# Patient Record
Sex: Male | Born: 1945 | Race: White | Hispanic: No | Marital: Married | Smoking: Never smoker
Health system: Southern US, Community
[De-identification: ages and names within clinical notes are randomized; demographics above are authoritative.]

## PROBLEM LIST (undated history)

## (undated) DIAGNOSIS — K219 Gastro-esophageal reflux disease without esophagitis: Secondary | ICD-10-CM

## (undated) DIAGNOSIS — N201 Calculus of ureter: Secondary | ICD-10-CM

## (undated) DIAGNOSIS — I219 Acute myocardial infarction, unspecified: Secondary | ICD-10-CM

## (undated) DIAGNOSIS — I252 Old myocardial infarction: Secondary | ICD-10-CM

## (undated) DIAGNOSIS — R0600 Dyspnea, unspecified: Secondary | ICD-10-CM

## (undated) DIAGNOSIS — Z87442 Personal history of urinary calculi: Secondary | ICD-10-CM

## (undated) DIAGNOSIS — E781 Pure hyperglyceridemia: Secondary | ICD-10-CM

## (undated) DIAGNOSIS — I10 Essential (primary) hypertension: Secondary | ICD-10-CM

## (undated) DIAGNOSIS — G4730 Sleep apnea, unspecified: Secondary | ICD-10-CM

## (undated) DIAGNOSIS — F32A Depression, unspecified: Secondary | ICD-10-CM

## (undated) DIAGNOSIS — Z8719 Personal history of other diseases of the digestive system: Secondary | ICD-10-CM

## (undated) DIAGNOSIS — M1990 Unspecified osteoarthritis, unspecified site: Secondary | ICD-10-CM

## (undated) DIAGNOSIS — E785 Hyperlipidemia, unspecified: Secondary | ICD-10-CM

## (undated) DIAGNOSIS — N183 Chronic kidney disease, stage 3 unspecified: Secondary | ICD-10-CM

## (undated) DIAGNOSIS — Z923 Personal history of irradiation: Secondary | ICD-10-CM

## (undated) DIAGNOSIS — Z9189 Other specified personal risk factors, not elsewhere classified: Secondary | ICD-10-CM

## (undated) DIAGNOSIS — D509 Iron deficiency anemia, unspecified: Secondary | ICD-10-CM

## (undated) DIAGNOSIS — I499 Cardiac arrhythmia, unspecified: Secondary | ICD-10-CM

## (undated) DIAGNOSIS — I4819 Other persistent atrial fibrillation: Secondary | ICD-10-CM

## (undated) DIAGNOSIS — I2510 Atherosclerotic heart disease of native coronary artery without angina pectoris: Secondary | ICD-10-CM

## (undated) DIAGNOSIS — I5042 Chronic combined systolic (congestive) and diastolic (congestive) heart failure: Secondary | ICD-10-CM

## (undated) DIAGNOSIS — Z955 Presence of coronary angioplasty implant and graft: Secondary | ICD-10-CM

## (undated) DIAGNOSIS — R0609 Other forms of dyspnea: Secondary | ICD-10-CM

## (undated) HISTORY — DX: Hyperlipidemia, unspecified: E78.5

## (undated) HISTORY — DX: Atherosclerotic heart disease of native coronary artery without angina pectoris: I25.10

## (undated) HISTORY — PX: CORONARY ANGIOPLASTY WITH STENT PLACEMENT: SHX49

## (undated) HISTORY — PX: CORONARY ANGIOPLASTY: SHX604

## (undated) HISTORY — DX: Gastro-esophageal reflux disease without esophagitis: K21.9

## (undated) HISTORY — DX: Essential (primary) hypertension: I10

## (undated) HISTORY — PX: CARDIOVASCULAR STRESS TEST: SHX262

## (undated) HISTORY — PX: EXTRACORPOREAL SHOCK WAVE LITHOTRIPSY: SHX1557

## (undated) SURGERY — BRONCHOSCOPY, WITH BIOPSY USING ELECTROMAGNETIC NAVIGATION
Anesthesia: General | Laterality: Right

---

## 1998-11-11 HISTORY — PX: HERNIA REPAIR: SHX51

## 1999-08-17 ENCOUNTER — Encounter: Payer: Self-pay | Admitting: General Surgery

## 1999-08-22 ENCOUNTER — Observation Stay (HOSPITAL_COMMUNITY): Admission: RE | Admit: 1999-08-22 | Discharge: 1999-08-23 | Payer: Self-pay | Admitting: General Surgery

## 2005-11-04 ENCOUNTER — Encounter: Payer: Self-pay | Admitting: Emergency Medicine

## 2005-11-04 ENCOUNTER — Inpatient Hospital Stay (HOSPITAL_COMMUNITY): Admission: AD | Admit: 2005-11-04 | Discharge: 2005-11-09 | Payer: Self-pay | Admitting: Cardiology

## 2005-11-04 ENCOUNTER — Ambulatory Visit: Payer: Self-pay | Admitting: Cardiology

## 2005-11-06 ENCOUNTER — Encounter: Payer: Self-pay | Admitting: Cardiology

## 2005-11-06 ENCOUNTER — Ambulatory Visit: Payer: Self-pay | Admitting: Cardiology

## 2005-11-13 ENCOUNTER — Ambulatory Visit (HOSPITAL_COMMUNITY): Admission: RE | Admit: 2005-11-13 | Discharge: 2005-11-14 | Payer: Self-pay | Admitting: Cardiology

## 2005-11-13 ENCOUNTER — Ambulatory Visit: Payer: Self-pay | Admitting: Cardiology

## 2005-12-02 ENCOUNTER — Ambulatory Visit: Payer: Self-pay | Admitting: Cardiology

## 2005-12-05 ENCOUNTER — Encounter (HOSPITAL_COMMUNITY): Admission: RE | Admit: 2005-12-05 | Discharge: 2006-03-05 | Payer: Self-pay | Admitting: Cardiology

## 2006-02-24 ENCOUNTER — Ambulatory Visit: Payer: Self-pay | Admitting: Cardiology

## 2006-03-06 ENCOUNTER — Encounter (HOSPITAL_COMMUNITY): Admission: RE | Admit: 2006-03-06 | Discharge: 2006-06-04 | Payer: Self-pay | Admitting: Cardiology

## 2006-03-21 ENCOUNTER — Encounter: Payer: Self-pay | Admitting: Vascular Surgery

## 2006-03-21 ENCOUNTER — Ambulatory Visit (HOSPITAL_COMMUNITY): Admission: RE | Admit: 2006-03-21 | Discharge: 2006-03-21 | Payer: Self-pay | Admitting: Emergency Medicine

## 2006-04-10 ENCOUNTER — Ambulatory Visit (HOSPITAL_COMMUNITY): Admission: RE | Admit: 2006-04-10 | Discharge: 2006-04-10 | Payer: Self-pay | Admitting: Urology

## 2006-04-10 HISTORY — PX: OTHER SURGICAL HISTORY: SHX169

## 2006-08-11 ENCOUNTER — Ambulatory Visit: Payer: Self-pay | Admitting: Cardiology

## 2007-09-16 ENCOUNTER — Ambulatory Visit: Payer: Self-pay | Admitting: Cardiology

## 2008-08-26 ENCOUNTER — Ambulatory Visit: Payer: Self-pay | Admitting: Cardiology

## 2009-07-25 ENCOUNTER — Encounter (INDEPENDENT_AMBULATORY_CARE_PROVIDER_SITE_OTHER): Payer: Self-pay | Admitting: *Deleted

## 2009-09-11 ENCOUNTER — Encounter: Payer: Self-pay | Admitting: Cardiology

## 2009-09-12 ENCOUNTER — Ambulatory Visit: Payer: Self-pay | Admitting: Cardiology

## 2010-08-27 ENCOUNTER — Encounter: Payer: Self-pay | Admitting: Cardiology

## 2010-09-26 ENCOUNTER — Ambulatory Visit: Payer: Self-pay | Admitting: Cardiology

## 2010-11-06 ENCOUNTER — Telehealth: Payer: Self-pay | Admitting: Cardiology

## 2010-11-29 ENCOUNTER — Encounter: Payer: Self-pay | Admitting: Cardiology

## 2010-12-11 NOTE — Assessment & Plan Note (Signed)
Summary: YEARLY/SL   Visit Type:  1 year follow up Primary Provider:  Darrow Bussing, MD  CC:  CAD.  History of Present Illness:   The patient is seen for followup of coronary artery disease.Peter [name]  He has not been having any chest pain.  His intervention was done in 2006.  He has not needed any type of evaluation since then.  It is now 5 years.  He doesn't have full activity.  His blood pressures being very nicely treated.  Current Medications (verified): 1)  Lipitor 80 Mg Tabs (Atorvastatin Calcium) .... Take One-Half Tablet By Mouth Daily. 2)  Amlodipine Besylate 10 Mg Tabs (Amlodipine Besylate) .... Take One Tablet By Mouth Daily 3)  Metoprolol Succinate 100 Mg Xr24h-Tab (Metoprolol Succinate) .... Take One Tablet By Mouth Daily 4)  Plavix 75 Mg Tabs (Clopidogrel Bisulfate) .... Take One Tablet By Mouth Daily 5)  Lisinopril 40 Mg Tabs (Lisinopril) .... Take One Tablet By Mouth Daily 6)  Tricor 145 Mg Tabs (Fenofibrate) .... Take One Tablet By Mouth Once Daily. 7)  Aspirin Ec 325 Mg Tbec (Aspirin) .... Take One Tablet By Mouth Daily 8)  Hydrochlorothiazide 25 Mg Tabs (Hydrochlorothiazide) .... Take One Tablet By Mouth Every Day.  Allergies (verified): No Known Drug Allergies  Past History:  Past Medical History: Last updated: 09/12/2009 CAD, NATIVE VESSEL (ICD-414.01)...PCI... December, 2006.... complex bifurcation lesion EF 55-65%.... echo.... December 2 006   Plavix... use indefinitely.... complex bifurcation lesion HYPERTENSION, UNSPECIFIED (ICD-401.9) HYPERCHOLESTEROLEMIA  IIA (ICD-272.0) OVERWEIGHT/OBESITY (ICD-278.02) GERD  Review of Systems       Patient denies fever, chills, headache, sweats, rash, change in vision, change in hearing, chest pain, cough, nausea vomiting, urinary symptoms.  All of the systems are reviewed and are negative  Vital Signs:  Patient profile:   65 year old male Height:      68 inches Weight:      221.50 pounds BMI:     33.80 Pulse rate:    67 / minute BP sitting:   132 / 72  (left arm) Cuff size:   regular  Vitals Entered By: Caralee Ates CMA (September 26, 2010 9:39 AM)  Physical Exam  General:  Patient is quite stable.  He is overweight. Head:  head is atraumatic. Eyes:  no xanthelasma. Abdomen:  abdomen is soft. Msk:  no musculoskeletal deformities. Extremities:  no peripheral edema. Skin:  no skin rashes. Psych:  patient is oriented to person, place affect is normal.   Impression & Recommendations:  Problem # 1:  CAD, NATIVE VESSEL (ICD-414.01)  His updated medication list for this problem includes:    Amlodipine Besylate 10 Mg Tabs (Amlodipine besylate) .Peter [name]... Take one tablet by mouth daily    Metoprolol Succinate 100 Mg Xr24h-tab (Metoprolol succinate) .Peter [name]... Take one tablet by mouth daily    Plavix 75 Mg Tabs (Clopidogrel bisulfate) .Peter [name]... Take one tablet by mouth daily    Lisinopril 40 Mg Tabs (Lisinopril) .Peter [name]... Take one tablet by mouth daily    Aspirin Ec 325 Mg Tbec (Aspirin) .Peter [name]... Take one tablet by mouth daily Coronary disease has been stable.  He is not having any significant symptoms.  However it has been 5 years since he has had any type of exercise testing.  We will proceed with a stress Myoview scan when it can be arranged.  EKG is done today and reviewed by me.  There is no significant change.  Orders: EKG w/ Interpretation (93000) Nuclear Stress Test (Nuc Stress Test)  Problem #  2:  HYPERTENSION, UNSPECIFIED (ICD-401.9)  His updated medication list for this problem includes:    Amlodipine Besylate 10 Mg Tabs (Amlodipine besylate) .Peter [name]... Take one tablet by mouth daily    Metoprolol Succinate 100 Mg Xr24h-tab (Metoprolol succinate) .Peter [name]... Take one tablet by mouth daily    Lisinopril 40 Mg Tabs (Lisinopril) .Peter [name]... Take one tablet by mouth daily    Aspirin Ec 325 Mg Tbec (Aspirin) .Peter [name]... Take one tablet by mouth daily    Hydrochlorothiazide 25 Mg Tabs (Hydrochlorothiazide) .Peter [name]... Take one tablet by mouth  every day. The patient's blood pressure is nicely treated.  No change in therapy.  Problem # 3:  HYPERCHOLESTEROLEMIA  IIA (ICD-272.0)  His updated medication list for this problem includes:    Lipitor 80 Mg Tabs (Atorvastatin calcium) .Peter [name]... Take one-half tablet by mouth daily.    Tricor 145 Mg Tabs (Fenofibrate) .Peter [name]... Take one tablet by mouth once daily. Lipids are being treated nicely.  No change in therapy.  Problem # 4:  OVERWEIGHT/OBESITY (ICD-278.02) Weight loss continues to be important for him.  Cardiology followup in one year.  We will look into arranging for an exercise test.  Patient Instructions: 1)  Your physician has requested that you have an exercise stress myoview.  For further information please visit [email or web address]  Please follow instruction sheet, as given.  PLEASE CALL us IN JAN. TO SCHEDULE TEST. 2)  Your physician wants you to follow-up in:  1 year.  You will receive a reminder letter in the mail two months in advance. If you don't receive a letter, please call our office to schedule the follow-up appointment. Prescriptions: HYDROCHLOROTHIAZIDE 25 MG TABS (HYDROCHLOROTHIAZIDE) Take one tablet by mouth every day.  #90 x 3   Entered by:   Meredith Staggers, RN   Authorized by:   Talitha Givens, MD, [facility]   Signed by:   Meredith Staggers, RN on 09/26/2010   Method used:   Electronically to        [facility] [phone]* (retail)       [address]       [place], [place]  [address]       Ph: [phone]       Fax: [phone]   RxID:   [id number] TRICOR 145 MG TABS (FENOFIBRATE) Take one tablet by mouth once daily.  #90 x 3   Entered by:   Meredith Staggers, RN   Authorized by:   Talitha Givens, MD, [facility]   Signed by:   Meredith Staggers, RN on 09/26/2010   Method used:   Electronically to        [facility] [phone]* (retail)       [address]       [place], [place]  [address]       Ph:  [phone]       Fax: [phone]   RxID:   [id number] LISINOPRIL 40 MG TABS (LISINOPRIL) Take one tablet by mouth daily  #90 x 3   Entered by:   Meredith Staggers, RN   Authorized by:   Talitha Givens, MD, [facility]   Signed by:   Meredith Staggers, RN on 09/26/2010   Method used:   Electronically to        [facility] #[id number]* (retail)       [address]  [place], [place]  [address]       Ph: [phone]       Fax: [phone]   RxID:   [id number] PLAVIX 75 MG TABS (CLOPIDOGREL BISULFATE) Take one tablet by mouth daily  #90 x 3   Entered by:   Meredith Staggers, RN   Authorized by:   Talitha Givens, MD, [facility]   Signed by:   Meredith Staggers, RN on 09/26/2010   Method used:   Electronically to        [facility] [phone]* (retail)       [address]       [place], [place]  [address]       Ph: [phone]       Fax: [phone]   RxID:   [id number] METOPROLOL SUCCINATE 100 MG XR24H-TAB (METOPROLOL SUCCINATE) Take one tablet by mouth daily  #90 x 3   Entered by:   Meredith Staggers, RN   Authorized by:   Talitha Givens, MD, [facility]   Signed by:   Meredith Staggers, RN on 09/26/2010   Method used:   Electronically to        [facility] [phone]* (retail)       [address]       [place], [place]  [address]       Ph: [phone]       Fax: [phone]   RxID:   [id number] AMLODIPINE BESYLATE 10 MG TABS (AMLODIPINE BESYLATE) Take one tablet by mouth daily  #90 x 3   Entered by:   Meredith Staggers, RN   Authorized by:   Talitha Givens, MD, [facility]   Signed by:   Meredith Staggers, RN on 09/26/2010   Method used:   Electronically to        [facility] [phone]* (retail)       [address]       [place], [place]  [address]       Ph: [phone]       Fax: [phone]   RxID:   [id number] LIPITOR 80 MG TABS (ATORVASTATIN CALCIUM) Take one-half  tablet by mouth daily.  #90 x 3   Entered by:   Meredith Staggers, RN   Authorized by:   Talitha Givens, MD, [facility]   Signed by:   Meredith Staggers, RN on 09/26/2010   Method used:   Electronically to        [facility] [phone]* (retail)       [address]       [place], [place]  [address]       Ph: [phone]       Fax: [phone]   RxID:   [id number]

## 2010-12-13 NOTE — Progress Notes (Signed)
Summary: question on meds- LVMTCB  Phone Note Call from Patient Call back at Home Phone [phone]   Caller: Patient Reason for Call: Talk to Nurse Summary of Call: pt is having laser surgery on his gum. pt has question re PLAVIX. Initial call taken by: Roe Coombs,  November 06, 2010 9:16 AM  Follow-up for Phone Call        LVMTCB Whitney Maeola Sarah RN  November 06, 2010 9:31 AM  Pt returning call Judie Grieve  November 06, 2010 10:11 AM  Patient having laser gum surgery and is wondering if he should be off of Plavix prior to this. He is having it done Friday. Dr. Denice Bors , his opthamologist, didn't say anything to him regarding the Plavix but did say there is a risk for bleeding. I explained to him that we would need to run this by Dr. Myrtis Ser who is here tomorrow. I will forward this to Dr. Myrtis Ser and his RN. Whitney Maeola Sarah RN  November 06, 2010 10:33 AM  Follow-up by: Whitney Maeola Sarah RN,  November 06, 2010 9:31 AM  Additional Follow-up for Phone Call Additional follow up Details #1::        pt called in: he spoke w/his surgeon and the surgeon stated that no one has had to come off of plavix in the past for this surgery.  He has continued to stay on his plavix.  adv would let md know as fyi.  Additional Follow-up by: Claris Gladden RN,  November 08, 2010 5:04 PM    Additional Follow-up for Phone Call Additional follow up Details #2::    OK    Talitha Givens, MD, [facility]  November 10, 2010 8:19 AM

## 2010-12-13 NOTE — Miscellaneous (Addendum)
  Clinical Lists Changes  Medications: Added new medication of NITROSTAT 0.4 MG SUBL (NITROGLYCERIN) take as directed - Signed Rx of NITROSTAT 0.4 MG SUBL (NITROGLYCERIN) take as directed;  #25 x 1;  Signed;  Entered by: Layne Benton, RN, BSN;  Authorized by: Talitha Givens, MD, [facility];  Method used: Electronically to [facility] #[id number]*, [address] [place], [place], [place]  [address], Ph: [phone], Fax: [phone]    Prescriptions: NITROSTAT 0.4 MG SUBL (NITROGLYCERIN) take as directed  #25 x 1   Entered by:   Layne Benton, RN, BSN   Authorized by:   Talitha Givens, MD, [facility]   Signed by:   Layne Benton, RN, BSN on 11/29/2010   Method used:   Electronically to        [facility] [phone]* (retail)       [address]       [place], [place]  [address]       Ph: [phone]       Fax: [phone]   RxID:   [id number]

## 2011-03-26 NOTE — Assessment & Plan Note (Signed)
[name]                            CARDIOLOGY OFFICE NOTE   Peter, [name] Peter [name]                     MRN:          [id number]  DATE:08/26/2008                            DOB:          [birth year]    Peter [name] is doing well.  He is here for cardiology followup.  He had  been aerating his yard with an aerating machine.  The next day he had  slight discomfort under his left breast.  I believe this was  musculoskeletal.  Other than this, he has done well.  He has not been  having chest pain or shortness of breath.  Unfortunately, he has had  some difficulty with his left knee.  He had this injected and hopefully  he will be improved, so that he can get back to golfing.   His intervention was done in 2006.  It was a complex bifurcation lesion.  On this basis, I have chosen to keep him on aspirin and Plavix, and he  is doing well.   ALLERGIES:  No known drug allergies.   MEDICATIONS:  Aspirin, Plavix, lisinopril, Toprol-XL, TriCor, Norvasc,  hydrochlorothiazide, iron, and Lipitor.   OTHER MEDICAL PROBLEMS:  See the list below.   REVIEW OF SYSTEMS:  He has no fevers or chills.  He has no GI or GU  symptoms.  Other than his knee, he is not having any other significant  musculoskeletal problems.  His review of systems is otherwise negative.   PHYSICAL EXAMINATION:  VITAL SIGNS:  Blood pressure is 135/85 with a  pulse of 63.  GENERAL:  The patient is oriented to person, time, and place.  Affect is  normal.  HEENT:  Reveals no xanthelasma.  He has normal extraocular motion.  There are no carotid bruits.  There is no jugular venous distention.  LUNGS:  Clear.  Respiratory effort is not labored.  CARDIAC:  Reveals an S1 with an S2.  There are no clicks or significant  murmurs.  ABDOMEN:  Soft.  EXTREMITIES:  He has no peripheral edema.   EKG is reviewed and shows normal sinus rhythm.  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   PROBLEMS:  1. Mild  obesity.  2. GERD.  3. Hypertension, treated.  4. Hypercholesterolemia, treated.  5. Coronary disease with a complex bifurcation lesion, treated      successfully in December 2006.  I will keep him on aspirin and      Plavix.  If he needs any type of procedure, his Plavix indefinitely      will be held.  I have considered exercise testing, but I feel it is      not needed at this time.  6. Good LV function.   Peter [name] is stable.  I will see him back in a year.     Peter Abed, MD, [facility]  Electronically Signed    JDK/MedQ  DD: 08/26/2008  DT: 08/26/2008  Job #: [id number]   cc:   Peter [name]. Peter [name], M.D.

## 2011-03-26 NOTE — Assessment & Plan Note (Signed)
[facility]                            CARDIOLOGY OFFICE NOTE   Peter [name], Peter [name] Peter [name]                     MRN:          [id number]  DATE:09/16/2007                            DOB:          14-Sep-1946    Peter [name] looks great.  He is going about full activities.  He is  having no particular problems.  He works part-time at [employer].  He is active and does well.  He has not had chest pain, syncope,  presyncope, or shortness of breath.   PAST MEDICAL HISTORY:  Other medical problems, see the list below.   ALLERGIES:  No known drug allergies.   MEDICATIONS:  Aspirin, Plavix, lisinopril, Toprol, Lipitor, Tricor,  Norvasc, and hydrochlorothiazide every other day.   REVIEW OF SYSTEMS:  He feels great, and his review of systems is  negative.   PHYSICAL EXAMINATION:  Weight is 205 pounds, which is stable for him,  but he could lose some weight.  Blood pressure today is 155/97.  He  tells me that he was late in taking his blood pressure meds today.  Recent blood pressure checks by Dr. Georgina Pillion have been normal, and the  patient's home cuff shows that his pressure is normal.  We will not  adjust his medicines today.  Patient is oriented to person, time, and place.  Affect is normal.  HEENT:  No xanthelasma.  He has normal extraocular motion.  There are no carotid bruits.  There is no jugular venous distention.  LUNGS:  Clear.  Respiratory effort is not labored.  CARDIAC:  An S1 with an S2.  There are no clicks or significant murmurs.  ABDOMEN:  Soft.  He has normal bowel sounds.  EXTREMITIES:  There is no peripheral edema.  There are no  musculoskeletal deformities.   EKG reveals old Q waves in leads I and aVL.  There is no significant  change.   PROBLEMS:  1. Mild obesity:  Losing some weight would be helpful.  2. Gastroesophageal reflux disease:  Mild renal insufficiency by      history.  3. Hypertension.  4. Hypercholesterol,  treated by Dr. Georgina Pillion, and the results are quite      good.  5. Coronary disease, post complex stenting with drug-eluting stents to      the left anterior descending artery in December, 2006.  He is now      two years out.  I have discussed with him the pro's and con's of      longer term Plavix, and I think in his case, we should use Plavix      longer term, and we will keep him on it.  6. Good left ventricular function.   He is doing well, and I will see him back in one year.     Peter Abed, MD, [facility]  Electronically Signed    JDK/MedQ  DD: 09/16/2007  DT: 09/16/2007  Job #: [id number]   cc:   Oley Balm. Georgina Pillion, M.D.

## 2011-03-29 NOTE — Op Note (Signed)
NAME:  OSMANI, KERSTEN NO.:  [id number]   MEDICAL RECORD NO.:  [id number]          PATIENT TYPE:  OIB   LOCATION:  2899                         FACILITY:  [facility]   PHYSICIAN:  Charlies Constable, M.D. [facility] DATE OF BIRTH:  11-06-1946   DATE OF PROCEDURE:  11/13/2005  DATE OF DISCHARGE:                                 OPERATIVE REPORT   PROCEDURES:  Left heart catheterization, coronary angiography, intravenous  ultrasound, and percutaneous transluminal coronary angioplasty.   SURGEON:  Charlies Constable, M.D.   CLINICAL HISTORY:  Mr. Treml is 65 years old and was admitted last week  with unstable angina and underwent complex procedure to the LAD.  It was a  bifurcation, and we did PTCA of the diagonal branch and stented the LAD, but  the procedure was complicated by acute occlusion of the LAD and slow flow in  the diagonal branch with a periprocedural MI.  We also had great difficulty  controlling back pain during the procedure.  We let him go home last week  and brought him back with plans to IVUS the LAD and fix the ramus branch of  the circumflex artery.   PROCEDURE:  The patient was given Versed and fentanyl prior to beginning the  procedure to try and stay ahead of his back pain.  We used the left femoral  artery.  A front wall arterial puncture was performed, and Omnipaque  contrast was used.  We used a CLS-4 Guidant catheter, 6-French with side  holes.  We crossed the stent with a Prowater wire without difficulty.  The  patient was given an Angiomax bolus and infusion and was enrolled in the  [place] trial and was randomized to a Plavix load or Cangrelor.  We passed  an Atlantis IVUS catheter down the LAD and did automatic pullback.  The  stent looked very good angiographically, but there were some areas where the  expansion was not quite optimal with 2.5 diameter in a vessel that was  approximately 3.0.  For this reason, we decided to post-dilate.  We went in  with  a 3.0 x 20-mm Quantum Maverick and performed one inflation up to 18  atmospheres for 30 seconds.  We kept the balloon just shy of the diagonal  branch so as not to compromise the diagonal branch.  We then went in with a  2.75 x 50-mm  Quantum Maverick and performed two inflations up to 20  atmospheres for 30 seconds.  We did cross the diagonal branch with this  balloon inflation.  Final diagnostic studies were then performed through the  Guidant catheter, and angiographically, the stent looked very well.   We then approached the ramus branch.  We passed the Prowater wire down the  ramus branch.  We predilated with a 2.5 x 12-mm Maverick performing two  inflations up to 8 atmospheres for 30 seconds.  Because there was quite a  bit of motion in the balloon, and because of concern that it would be  difficult to position a stent, we elected to treat this with cutting balloon  angioplasty.  We  used a 3.0 x 10-mm cutter, and we performed three  inflations up to 10 atmospheres for 30 seconds.  Final diagnostic studies  were then performed through the Guidant catheter.   We performed a left ventriculogram at the end of the procedure.  The left  femoral artery was closed with the AngioSeal at the end of the procedure.  The patient tolerated the procedure well and left the laboratory in  satisfactory condition.   RESULTS:  1.  Initially, the LAD stent appeared to be widely patent.  The diagonal      branch had TIMI-III flow with ostium that appeared to be 80% narrowed.      The IVUS measurements in the stent showed that the distal reference was      about 3.0, and there were areas within the stent where the stent      diameter was about 2.2-2.5.  For this reason, we elected to post-dilate      this.  We did not perform a final IVUS after the post-dilatation, but      angiographically it looked quite good.  2.  The stenosis in the ramus branch was initially estimated at 80% and      following  cutting balloon angioplasty, this improved to less than 20%.   CONCLUSIONS:  1.  Successful touch up post-dilatation of the three tandem overlying      Cypher stents in the proximal to mid-LAD based on IVUS measurements with      0% stenosis angiographically at the end of the procedure and a patent      diagonal branch with TIMI-III flow.  2.  Successful cutting balloon angioplasty of the ramus branch of the      circumflex artery with improvement in percent narrowing from 80% to less      than 20%.   DISPOSITION:  The patient will return for further observation.  The patient  will remain on Plavix long term with three overlapping drug-eluting stents  in the LAD.           ______________________________  Charlies Constable, M.D. [facility]     BB/MEDQ  D:  11/13/2005  T:  11/13/2005  Job:  [id number]   cc:   Oley Balm. Georgina Pillion, M.D.  Fax: [phone]   Willa Rough, M.D.  [address]  [address]  [place]  [place] [address]   Cardiopulmonary Lab

## 2011-03-29 NOTE — Cardiovascular Report (Signed)
NAME:  Peter [name], Peter [name] NO.:  [id number]   MEDICAL RECORD NO.:  [id number]          PATIENT TYPE:  INP   LOCATION:  2910                         FACILITY:  [facility]   PHYSICIAN:  Charlton Haws, M.D.     DATE OF BIRTH:  1945/12/29   DATE OF PROCEDURE:  11/05/2005  DATE OF DISCHARGE:                              CARDIAC CATHETERIZATION   Coronary arteriography.   INDICATIONS:  Classic unstable angina for 2 weeks.   Cine catheterization was done from the right femoral artery using 6-French  catheters.   Left main coronary artery had 20% distal stenosis.   Left anterior descending artery had a 95% discrete stenosis at the takeoff  of the second diagonal branch. The origin of a large second diagonal branch  was involved. Distal vessel was normal. There was 30-40% disease in an  intermediate branch.   Right coronary artery has some catheters spasms. The distal vessel had 30-  40% tubular disease.   Right coronary artery was dominant.   RAO VENTRICULOGRAPHY:  RAO ventriculography was normal. EF was 60%. There  was no gradient across the aortic valve and no MR. Aortic pressure was  160/100, LV pressure is 160/19.   IMPRESSION:  The films were reviewed with Dr. Juanda Chance. He will proceed with  angioplasty of the LAD diagonal branch. The ostium of the second diagonal  branch is involved with the lesion and I am not sure that it will be  possible to stent over this.   The patient will be kept in the holding area on heparin and Angiomax while  Dr. Juanda Chance finishes his previous case.   The patient had no chest pain during the procedure. His IV nitroglycerin was  doubled due to his increased blood pressure.   He was also given some Versed for anxiety and will probably require some  Nubain for some chronic back pain.           ______________________________  Charlton Haws, M.D.     PN/MEDQ  D:  11/05/2005  T:  11/05/2005  Job:  [id number]   cc:   Oley Balm. Georgina Pillion, M.D.  Fax: [phone]   Willa Rough, M.D.  [address]  [address]  [place]  [place] [address]

## 2011-03-29 NOTE — Cardiovascular Report (Signed)
NAME:  Peter [name], HIMES NO.:  [id number]   MEDICAL RECORD NO.:  [id number]          PATIENT TYPE:  EMS   LOCATION:  ED                           FACILITY:  [facility]   PHYSICIAN:  Charlies Constable, M.D. [facility] DATE OF BIRTH:  [birth year]   DATE OF PROCEDURE:  11/05/2005  DATE OF DISCHARGE:  11/04/2005                              CARDIAC CATHETERIZATION   CLINICAL HISTORY:  Mr. Nickson is 65 years old and had no prior history of  known heart disease and was admitted to the hospital with progressive angina  over the past 10 days.  His troponins were negative.  A diagnostic procedure  was performed earlier in the day by Dr. Eden Emms and showed a tight branch  stenosis involving the left anterior descending and the first diagonal  branch.  There was also an 80% lesion in the fairly large ramus branch of  the circumflex artery.  His creatinine was 1.3 and we elected to proceed  with intervention on the bifurcation lesion.   PROCEDURE:  The procedure was performed with the right femoral artery as an  arterial sheath and a 7 French Q 3.5 guiding catheter with side holes.  The  patient was given Angiomax bolus infusion and had already been loaded with  Plavix on admission.  We initially crossed the lesion in the LAD with a pro  water wire without too much difficulty.  We then passed the BMW wire down  the diagonal branch.  We dilated in the LAD with a 2.5 x 20 mm Maverick  performing two inflations up to 8 atmospheres for 30 seconds.  We then  dilated in the diagonal branch with a 2.5 x 12 mm Maverick performing two  inflations up to 8 atmospheres for 30 seconds.  We then deployed a 2.5 x 23  mm Cypher stent in the proximal LAD across the diagonal branch.  We deployed  this with one inflation of 11 atmospheres for 30 seconds.  Following  deployment of the stent, we realized that there was the distal edge of the  stent did not completely cover the lesion so we placed a second 2.5 x 8 mm  Cypher stent distal to the first stent and just overlapping the first stent.  Shortly after placement of this stent, the patient developed chest pain and  was found to have complete occlusion of the LAD.  We went back in with a 2.5  x 20 mm Maverick and performed dilatation within the stent and reestablished  flow.  It appeared there was a filling defect right at the proximal edge of  the stent which extended into the first stent.  We were not certain whether  this represented an edge tear or an in situ thrombosis.  It appeared that  the stent was not full expanded in its mid portion so it could have been in  stent thrombosis related to that.  In order to salvage the situation, we  elected to place a third 2.75 x 13 mm Cypher stent proximal to and  overlapping the first stent.  We deployed this with one inflation  of 14  atmospheres per 30 seconds.  We had previously reestablished flow with  balloon dilatation with a 2.5 x 20 mm Maverick.  At this point, we decided  to abandon any further attempts at salvaging the diagonal branch and we used  a 2.75 x 20 mm quantum Maverick and dilated three times within the three  stents, avoiding the distal edge.  We went up to a total of 20 atmospheres  for 20 seconds.  Final diagnostic studies were then performed through the  guiding catheter.   The procedure was extremely difficult even before occlusion of the LAD.  The  patient had severe back pain and required high doses of Versed, fentanyl and  Nubain without complete relief of his back pain.  At the end of the  procedure after we had reopened the LAD and post dilated the stent in the  LAD, he was more stable with minimal pain although the flow in the diagonal  branch was only TIMI-I.  We closed the right femoral artery with Angio-Seal  at the end of the procedure after obtaining and electrocardiogram.   RESULTS:  Initially, the stenosis in the left anterior descending was  located just before and  just after the diagonal branch and was estimated at  90-95%.  Following placement of the three overlapping stents, this stenosis  improved to less than 10%.  The diagonal branch initially was only about 70%  narrowed at its ostium and this was pinched to about 90% with TIMI-I flow at  the end of the procedure.   Once we developed complete occlusion of the LAD, we added Integrilin double  bolus and infusion.  We also gave an additional 300 mg of Plavix and 20 mg  of Pepcid.   CONCLUSION:  PCI of the bifurcation lesion of the left anterior descending  and diagonal branch of the left anterior descending with improvement in the  left anterior descending stenosis from 90% to less than 10% but residual 90%  stenosis in the diagonal branch with TIMI-I flow.  The procedure was  complicated by transient occlusion of the left anterior descending requiring  an additional, (third), stent.   DISPOSITION:  The patient returned PACU for further observation.  Will plan  to continue Angiomax for two more hours and continue the Integrilin for 24  hours.  His post intervention electrocardiogram showed 0.5 mm lateral ST  elevation probably indicative of the TIMI-I flow in the diagonal branch.  He  almost certainly will have suffered a periprocedural infarction which  hopefully will not be large.  In spite the difficulty of the procedure, he  will probably need intervention of the ramus branch at some later time.  Will also need to follow his creatinine over the next 48-72 hours.           ______________________________  Charlies Constable, M.D. [facility]     BB/MEDQ  D:  11/05/2005  T:  11/06/2005  Job:  [id number]   cc:   Oley Balm. Georgina Pillion, M.D.  Fax: [phone]   Willa Rough, M.D.  [address]  [address]  [place]  [place] [address]   Cardiopulmonary laboratory

## 2011-03-29 NOTE — Discharge Summary (Signed)
NAME:  Peter [name], Peter [name]              ACCOUNT NO.:  [id number]   MEDICAL RECORD NO.:  [id number]          PATIENT TYPE:  INP   LOCATION:  3702                         FACILITY:  [facility]   PHYSICIAN:  Charlies Constable, M.D. [facility] DATE OF BIRTH:  28-Aug-1946   DATE OF ADMISSION:  11/04/2005  DATE OF DISCHARGE:  11/09/2005                                 DISCHARGE SUMMARY   PROCEDURES:  1.  Coronary angiogram/complex percutaneous intervention bifurcation      LAD/diagonal artery, November 05, 2005.  2.  2-D echocardiogram.   PRINCIPAL DIAGNOSES:  1.  Unstable angina __________.      1.  Status post Cypher left anterior descending artery (x3)/PTCA          diagonal artery.      2.  Complicated by transient LAD occlusion/periprocedural MI.      3.  Residual 80% ramus intermedius.  2.  Normal left ventricular function.      1.  By 2-D echocardiogram.  3.  Chronic renal insufficiency.  4.  Hypertension.  5.  Mixed dyslipidemia.  6.  Hypokalemia.  7.  History of hypertension.   REASON FOR ADMISSION:  Peter [name] is a 65 year old male, with no prior  cardiac history, who initially presented to the emergency room with signs  and symptoms classic for unstable angina __________.   LABORATORY DATA:  Cardiac enzymes:  Initially essentially negative on  admission with post procedural enzymes notable for a total CPK of 722/108.8  (15%); troponin I 11.04.  Lipid profile:  Total cholesterol 129,  triglycerides 269, HDL 33, LDL 42 (cholesterol/HDL ratio 3.9), TSH 1.43.  Potassium 3.9 on admission - low 3.3-3.8 predischarge.  BUN 24/creatinine  1.8 on admission - 22/4.5 predischarge.  WBC 4.7, hemoglobin 11.4,  hematocrit 34, platelets 225 on admission.   Admission chest x-ray:  No acute disease.   HOSPITAL COURSE:  Following presentation to the emergency room, the patient  was admitted for further evaluation and management of symptoms worrisome for  crescendo angina pectoris.  He was placed on aspirin  and started on  Lopressor 25 q.8 h. and continued on IV nitroglycerin and heparin.  Maxzide  was discontinued and the patient was treated with gentle hydration, given  his mild renal insufficiency.  He was also loaded with 600 mg of Plavix in  anticipation of percutaneous intervention.   The following day, the patient underwent diagnostic coronary angiography  after admission, revealing high grade 95% mid LAD lesion at the takeoff of  the diagonal artery.  Films were reviewed with Dr. Juanda Chance, who then  proceeded with complex percutaneous intervention involving Cypher stenting  of the LAD (x3), which was complicated by transient occlusion of the LAD  either to suboptimal deployment or proximal edge tear requiring an  additional stent.  An electrocardiogram taken at the end of this procedure  was notable for .5 mm ST elevation in leads V5-6, which Dr. Juanda Chance felt was  due to slow flow in the diagonal branch.   Following balloon angioplasty attempt of the diagonal, there was a residual  90% lesion with TIMI I-II  flow.  There was also a 90% residual ramus  intermedius lesion, which Dr. Juanda Chance recommends proceeding with staged  elective intervention following discharge.   Post procedure cardiac markers were, in fact, elevated, reaching a CPK level  of 722/108.8 (15%) with an associated troponin I of 11.   The patient was closely monitored and had no further complaint of chest  pain, but did complain of lower back pain.  Renal function was also closely  monitored and creatinine remains stable following an admission level of 1.8  with subsequent normalization of renal function with a presdischarge  creatinine level of 1.5.   In addition to discontinuation of Maxzide, lisinopril was down titrated from  40 to 20 daily.  Low dose Lipitor was also added prior to discharge.   FINAL RECOMMENDATIONS:  The patient will be discharged home on Saturday,  December 30, with arrangements made for  subsequent return for elective PCI  of the ramus intermedius artery on Wednesday, January 3.   DISCHARGE MEDICATIONS:  1.  Plavix 75 mg daily (new).  2.  Toprol XL 75 mg daily (new).  3.  Lipitor 20 mg daily (new).  4.  Coated aspirin 325 mg daily.  5.  Lisinopril 20 mg daily.  6.  Norvasc 10 mg daily.  7.  Tricor 145 mg daily.  8.  Prilosec 20 mg daily.  9.  Nitrostat 0.4 mg.   INSTRUCTIONS:  1.  Return to [facility] on Wednesday, January 3, for scheduled      catheterization at 8:30 a.m. - the patient is to arrive to Short Stay,      Section C, at 6:30 a.m.  2.  The patient is instructed to refrain from eating or drinking after      midnight, January 2.  3.  The patient is to follow up in the office with Dr. Willa Rough on      Tuesday, January 23, at 10:30 a.m.  4.  The patient is to discontinue taking Maxzide.   DISCHARGE DURATION TIME:  30 minutes.      Gene Serpe, P.A. [facility]    ______________________________  Charlies Constable, M.D. [facility]    GS/MEDQ  D:  11/08/2005  T:  11/09/2005  Job:  [id number]   cc:   Oley Balm. Georgina Pillion, M.D.  Fax: [phone]

## 2011-03-29 NOTE — H&P (Signed)
NAME:  Peter [name], Peter [name] NO.:  [id number]   MEDICAL RECORD NO.:  [id number]          PATIENT TYPE:  INP   LOCATION:  2027                         FACILITY:  [facility]   PHYSICIAN:  Willa Rough, M.D.     DATE OF BIRTH:  [birth year]   DATE OF ADMISSION:  11/04/2005  DATE OF DISCHARGE:                                HISTORY & PHYSICAL   PRIMARY CARDIOLOGIST:  Willa Rough, M.D. (new).   REASON FOR ADMISSION:  Peter [name] is a 65 year old male with no prior  cardiac history, but with cardiac risk factors notable for long-standing  hypertension, history of hypercholesterolemia, and age, who is now  transferred directly from [facility] ER for further evaluation of new onset angina  pectoris relieved by nitroglycerin.   The patient presents with an approximately 10-day history of new onset  exertional angina pectoris radiating to the jaw and with associated dyspnea.  These symptoms are precipitated by moderate exertion and resolve  approximately 10 minutes after rest.  He has noted these symptoms in the  recent past while walking upstairs or when he recently washed his care.  Today, however, he experienced his worst episode to date.  This occurred  while walking in and out of his house carrying Christmas packages.  He  developed 7/10 squeezing anterior chest pain associated with bilateral jaw  radiation and with dyspnea.  There was no associated diaphoresis, nausea, or  vomiting.  He called his wife for assistance, took a baby aspirin, and she  drove him to [facility] ER.  While there, he was treated with sublingual  nitroglycerin and subsequently placed on IV nitroglycerin and heparin.  He  reported improvement of his symptoms with sublingual NTG and is currently  pain free.   Initial cardiac markers are normal.  Electrocardiogram shows no acute  changes.   ALLERGIES:  No known drug allergies.   MEDICATIONS PRIOR TO ADMISSION:  1.  Lisinopril 40 daily.  2.  Maxzide one tablet  daily.  3.  Tricor 145 daily.  4.  Norvasc 10 daily.  5.  Prilosec.   PAST MEDICAL HISTORY:  1.  Hypertension.  (Initiated treatment in 1991.)  2.  Hypercholesterolemia.  3.  History of nephrolithiasis.  4.  Bilateral inguinal hernia repair in 2000.   SOCIAL HISTORY:  The patient lives here in [place] with his wife.  They  have 2 grown boys.  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  He has never  smoked tobacco, and drinks only 2 beers a year.   FAMILY HISTORY:  Mother died at age 66 with no known heart disease.  Father  deceased at age 62 with sudden death, presumed MI, with history of heart  failure.  Siblings have no known heart disease.   REVIEW OF SYSTEMS:  The patient denies any recent development of orthopnea,  PND, or lower extremity edema.  He denies any recent evidence of upper or  lower GI bleeding.  He does have reflux symptoms, but no known history of  ulcers.  He denies any prior history of myocardial infarction, congestive  heart failure, or stroke.  Otherwise, as noted, the remaining systems were  negative.   PHYSICAL EXAMINATION:  VITAL SIGNS:  Blood pressure 142/78, pulse 103 and  regular, respirations 22, temperature 98.0.  Saturations 97% on 2 liters.  GENERAL:  A 65 year old male in no apparent distress.  HEENT:  Normocephalic and atraumatic.  NECK:  Preserved bilateral carotid pulses without bruits.  No JVD.  LUNGS:  Clear to auscultation in all fields.  HEART:  Regular rate and rhythm (S1 and S2).  No murmurs, rubs, or gallops.  ABDOMEN:  Soft, nontender, with intact bowel sounds.  EXTREMITIES:  Preserved bilateral femoral pulses with no bruits.  Intact  distal pulses with no significant pedal edema.  NEUROLOGIC:  No focal deficit.   CHEST X-RAY:  No acute disease.   ELECTROCARDIOGRAM:  Normal sinus rhythm at 92 beats per minute with left  axis deviation.  Q waves in leads I and  aVL with no acute changes.   LABORATORY DATA:  Hemoglobin of 11.4, hematocrit 34, WBC 4.7, platelets 225.  Sodium 145, potassium 3.9, BUN 24, creatinine 1.8, glucose 112.  CPK MB less  than 1.0.  Troponin I less than 0.05.  INR of 1.2.   IMPRESSION:  1.  Crescendo angina pectoris.      1.  New onset.  2.  Multiple cardiac risk factors.      1.  Long-standing hypertension.      2.  History of hypercholesterolemia.      3.  Age.  3.  Mild renal insufficiency.  4.  Gastroesophageal reflux disease.  5.  Obesity.   PLAN:  The patient will be admitted to telemetry for close monitoring and  further management of symptoms worrisome for new onset angina pectoris.  He  will be continued on aspirin, and we will treat with a 600 mg load of  Plavix.  We will start Lopressor 25 q.8h. and discontinue Maxzide.  The  patient will otherwise remain on lisinopril and Norvasc.  We will continue  IV nitroglycerin and heparin, and, if cardiac markers are abnormal, consider  addition of a 2b3ba.   The patient will be started on gentle hydration for mild renal  insufficiency.  A BMET will be checked in the morning.  We will also check  fasting lipids and a TSH level.   The recommendation is to proceed with cardiac catheterization in the  morning.  The patient is agreeable with this plan, and is willing to  proceed.      Peter [name], P.A. [facility]    ______________________________  Willa Rough, M.D.    GS/MEDQ  D:  11/04/2005  T:  11/04/2005  Job:  [id number]   cc:   Oley Balm. Georgina Pillion, M.D.  Fax: [phone]

## 2011-03-29 NOTE — Op Note (Signed)
NAME:  Peter [name], Peter [name] NO.:  [id number]   MEDICAL RECORD NO.:  [id number]          PATIENT TYPE:  OIB   LOCATION:  0098                         FACILITY:  [facility]   PHYSICIAN:  Bertram Millard. Dahlstedt, M.D.DATE OF BIRTH:  [birth year]   DATE OF PROCEDURE:  04/10/2006  DATE OF DISCHARGE:                                 OPERATIVE REPORT   PREOPERATIVE DIAGNOSIS:  Left ureteral stone.   POSTOPERATIVE DIAGNOSIS:  Left ureteral stone.   PRINCIPAL PROCEDURE:  Left ureteroscopy with stone extraction.   SURGEON:  Bertram Millard. Dahlstedt, M.D.   ANESTHESIA:  General with LMA.   COMPLICATIONS:  None.   BRIEF HISTORY:  A 65 year old male with a prior history of urolithiasis.  He  presented last week to Dr. Vonita Moss and was found to have a fairly large  left upper ureteral stone.  He was significantly symptomatic.  Lithotripsy  was the treatment of choice but the patient has been on Plavix and it is not  recommended he come off of this for a long time.  We consulted Dr. Myrtis Ser, and  it was recommended that he stay off his Plavix as  short a period of time as  possible.  He has been off of this for almost 5 days.  He presents at this  time for ureteral stone extraction.  He is aware of the risks and  complications of the procedure and has desired to proceed.   DESCRIPTION OF PROCEDURE:  The patient was administered preoperative IV  antibiotics. He was taken to the operating room after the surgical side was  marked and administered a general anesthetic using LMA.  He was placed in  the dorsal lithotomy position.  Genitalia and perineum were prepped and  draped.  A 6-French short ureteroscope was passed under direct vision into  his bladder.  The tip of the stone could be seen crowning in the ureteral  orifice.  The stone was gently manipulated out of the orifice using the beak  of the scope.  The entire left distal ureter was then inspected with the  ureteroscope.  No further stones  were seen.  A couple of tiny fragments were  left within the bladder, and it was felt that they would pass without much  difficulty.  At this point, the bladder was drained and the procedure  terminated after the stone was extracted.   The patient tolerated the procedure well. He was awakened and taken to PACU  in stable condition.  He will be observed for several hours, at least, in  the hospital postoperatively.      Bertram Millard. Dahlstedt, M.D.  Electronically Signed     SMD/MEDQ  D:  04/10/2006  T:  04/10/2006  Job:  [id number]   cc:   Willa Rough, M.D.  [address]  [address]  [place]  [place] [address]

## 2011-03-29 NOTE — H&P (Signed)
NAME:  Peter [name], Peter [name] NO.:  [id number]   MEDICAL RECORD NO.:  [id number]          PATIENT TYPE:  OIB   LOCATION:  6526                         FACILITY:  [facility]   PHYSICIAN:  Charlies Constable, M.D. [facility] DATE OF BIRTH:  Jan 24, 1946   DATE OF ADMISSION:  11/13/2005  DATE OF DISCHARGE:  11/14/2005                                HISTORY & PHYSICAL   PRIMARY CARE PHYSICIAN:  Willa Rough, M.D.   PATIENT PROFILE:  This is a 65 year old white male with a history of  unstable angina status post PCI and stenting of the LAD in December 2006 who  presents today for PCI of the ramus.   PROBLEMS:  1.  Coronary artery disease.      1.  Status post percutaneous intervention and stenting of the left          anterior descending with drug-eluting stent December 2006.  Normal          left ventricular function.  2.  Renal insufficiency.  3.  Hypertension.  4.  Hyperlipidemia.  5.  Hypertriglyceridemia.   HISTORY OF PRESENT ILLNESS:  This is a 65 year old white male who is status  post PCI and stenting of the LAD with three drug-eluting stents in December  2006.  At that time, he was also noted to have residual ramus intermedius  disease and decision was made for staged intervention.  The patient presents  today on November 13, 2005, for PCI of the ramus.  He does not have any  additional chest pain.   ALLERGIES:  No known drug allergies.   CURRENT MEDICATIONS:  1.  Aspirin 325 mg every day.  2.  Plavix 75 mg every day.  3.  Lisinopril 20 mg every day.  4.  Toprol XL 100 mg every day.  5.  Lipitor 20 mg every day.  6.  Tricor 145 mg every day.  7.  Norvasc 10 mg every day.  8.  Prilosec 20 mg every day.  9.  Nitroglycerin 0.4 mg sublingual p.r.n. chest pain.   FAMILY HISTORY:  Noncontributory.   SOCIAL HISTORY:  No tobacco or alcohol use.   REVIEW OF SYSTEMS:  Positive for unstable angina in December, renal  insufficiency.  All other systems reviewed are negative.   PHYSICAL EXAMINATION:  GENERAL:  Pleasant white male in no acute distress.  Alert and oriented x3.  NECK:  No carotid bruits or JVD.  LUNGS:  Clear to auscultation.  Respirations are nonlabored.  HEART:  Normal S1 and S2.  No S3 or S4 murmurs.  ABDOMEN:  Soft, nontender, nondistended.  Bowel sounds present x4.  EXTREMITIES:  Warm, dry and pink.  No cyanosis, clubbing or edema.  Dorsalis  pedis and posterior tibial pulses are 2+ and equal bilaterally.   LABORATORY DATA:  None.   ASSESSMENT AND PLAN:  1.  Coronary artery disease.  The patient presents for PCI with stenting of      the ramus today.  He is status post PCI of the LAD and is doing well.      He remains on medical therapy  including aspirin, Plavix, beta blocker,      ACE inhibitor and Statin therapy.  2.  Hypertension, stable.  Continue current regimen.  3.  Hyperlipidemia/hypertriglyceridemia.  Continue Statin and Tricor.      Ok Anis, NP    ______________________________  Charlies Constable, M.D. [facility]    CRB/MEDQ  D:  12/19/2005  T:  12/20/2005  Job:  [id number]

## 2011-03-29 NOTE — Discharge Summary (Signed)
NAME:  Peter [name], Peter [name] NO.:  [id number]   MEDICAL RECORD NO.:  [id number]          PATIENT TYPE:  OIB   LOCATION:  6526                         FACILITY:  [facility]   PHYSICIAN:  Charlies Constable, M.D. [facility] DATE OF BIRTH:  April 28, 1946   DATE OF ADMISSION:  11/13/2005  DATE OF DISCHARGE:  11/14/2005                                 DISCHARGE SUMMARY   PRIMARY CARDIOLOGIST:  Dr. Willa Rough.   PRINCIPAL DIAGNOSIS:  Coronary artery disease.   OTHER DIAGNOSES:  1.  Status post PCI and stenting of the LAD with drug eluding stents in      December of 2006.  2.  Normal left ventricular function.  3.  Chronic renal insufficiency.  4.  Hypertension.  5.  Hyperlipidemia.  6.  Hypertriglyceridemia.  7.  Hypertension.   ALLERGIES:  No known drug allergies.   PROCEDURE:  PTCA of the ramus and IVUS of the LAD.   HISTORY OF PRESENT ILLNESS:  This is a 65 year old white male with prior  history of unstable angina, status post PCI and stenting of the LAD with 3  drug eluding stents in December of 2006. At that time, he was noted to have  residual ramus intermedius disease and decision was made for stage  intervention of the ramus with scheduling of outpatient PCI on November 13, 2005.   HOSPITAL COURSE:  The patient was taken to the cardiac cath lab on January 3  and underwent successful PTCA of the proximal ramus. No stents were placed.  Intravascular ultrasound was also performed in the LAD, showing incomplete  stent opposition. A 3.0 balloon was then used to inflate inside the stent  for better opposition. The patient tolerated this procedure well and post  procedure has remained pain free, has been ambulating without difficulty.   Post procedure cardiac markers have remained negative and he is being  discharged home today in satisfactory condition.   DISCHARGE LABS:  Hemoglobin 9.4, hematocrit 27.4, WBC 5.3, platelets 227.  Sodium 142, potassium 3.9, chloride 110, CO2 28,  BUN 19, creatinine 1.5,  glucose 103. Total bilirubin 0.5, alkaline phosphatase 52, AST 18, ALT 15,  albumin 3.2. Cardiac markers negative x2. Calcium 8.6.   DISPOSITION:  The patient is being discharged home today, in good condition.   FOLLOW UP:  He has an appointment with Dr. Willa Rough in cardiology clinic  on January 23 at 10:30 a.m.   DISCHARGE MEDICATIONS:  Aspirin 325 mg q.d., Plavix 75 mg q.d., lisinopril  20 mg q.d., Toprol XL 100 mg q.d., Lipitor 20 mg q.d., Tricor 145 mg q.d.,  Norvasc 10 mg q.d., Prilosec 20 mg q.d., nitroglycerin 0.4 mg sublingual  p.r.n. chest pain.   OUTSTANDING LAB STUDIES:  None.   DURATION DISCHARGE ENCOUNTER:  40 minutes, including physician time.      Ok Anis, NP    ______________________________  Charlies Constable, M.D. [facility]    CRB/MEDQ  D:  11/14/2005  T:  11/14/2005  Job:  [id number]   cc:   Willa Rough, M.D.  [address]  [address]  [place]  [place] [address]

## 2011-03-29 NOTE — Assessment & Plan Note (Signed)
[facility]                              CARDIOLOGY OFFICE NOTE   Peter [name], Peter [name]                     MRN:          [id number]  DATE:08/11/2006                            DOB:          [birth year]    Peter [name] is doing well. In May of this year we were concerned because he  needed work done to a renal stone. After very careful consideration we held  his Plavix for three days and did his urologic procedure and then resumed  his Plavix. He did well. He did not have any significant cardiac problems,  and stone issue was resolved.   He is going about full activities now. He is working 20 or 30 hours a week  doing active work at [employer]. He also is playing golf. He is  not having any chest pain or significant shortness of breath. Dr. Georgina Pillion has  followed his labs carefully, and my understanding is that his LDL is quite  low.   ALLERGIES:  No known drug allergies.   MEDICATIONS:  1. Aspirin.  2. Plavix.  3. Lisinopril.  4. Toprol XL.  5. Lipitor.  6. Tricor.  7. Norvasc.   OTHER MEDICAL PROBLEMS:  See the complete list below.   REVIEW OF SYSTEMS:  He knows he has gained some weight. He is trying to  watch his diet very carefully. His Review of Systems, otherwise, is  negative.   PHYSICAL EXAMINATION:  VITAL SIGNS:  Blood pressure 126/80, pulse 58, weighs  204 pounds.  GENERAL:  Appearance looks quite good, and he is quite stable. The patient  is oriented to person, time and place, and his affect is normal.  HEENT:  Reveals no xanthelasma. He has normal extraocular motion.  NECK:  There are no carotid bruits. There is no jugular venous distention.  LUNGS:  Clear. Respiratory effort is not labored.  BACK:  There is no kyphosis or scoliosis.  CARDIAC:  Reveals S1 and S2. There are no clicks or significant murmurs.  ABDOMEN:  Soft. There are no masses or bruits.  EXTREMITIES:  The patient has no peripheral edema. There are  2+ distal  pulses.   EKG shows no significant change.   PROBLEMS:  1. Mild obesity. He is working on this.  2. GERD.  3. Mild renal insufficiency.  4. Hypertension, treated.  5. Hypercholesterolemia, treated.  6. Coronary disease status post complex stenting with drug-eluting stents      to the LAD in December 2006.  7. Good LV function.   Peter [name] is stable. I will not be changing any of his medications. I will  see him back in one year for followup.            ______________________________  Luis Abed, MD, [facility]     JDK/MedQ  DD:  08/11/2006  DT:  08/12/2006  Job #:  [id number]   cc:   Oley Balm. Georgina Pillion, M.D.

## 2011-08-28 ENCOUNTER — Encounter: Payer: Self-pay | Admitting: Cardiology

## 2011-09-13 ENCOUNTER — Encounter: Payer: Self-pay | Admitting: Cardiology

## 2011-09-13 ENCOUNTER — Encounter: Payer: Self-pay | Admitting: *Deleted

## 2011-09-13 DIAGNOSIS — E785 Hyperlipidemia, unspecified: Secondary | ICD-10-CM | POA: Insufficient documentation

## 2011-09-13 DIAGNOSIS — K219 Gastro-esophageal reflux disease without esophagitis: Secondary | ICD-10-CM | POA: Insufficient documentation

## 2011-09-13 DIAGNOSIS — I2583 Coronary atherosclerosis due to lipid rich plaque: Secondary | ICD-10-CM

## 2011-09-13 DIAGNOSIS — I2510 Atherosclerotic heart disease of native coronary artery without angina pectoris: Secondary | ICD-10-CM | POA: Insufficient documentation

## 2011-09-13 DIAGNOSIS — I10 Essential (primary) hypertension: Secondary | ICD-10-CM | POA: Insufficient documentation

## 2011-09-16 ENCOUNTER — Encounter: Payer: Self-pay | Admitting: Cardiology

## 2011-09-16 ENCOUNTER — Ambulatory Visit (INDEPENDENT_AMBULATORY_CARE_PROVIDER_SITE_OTHER): Payer: Self-pay | Admitting: Cardiology

## 2011-09-16 ENCOUNTER — Ambulatory Visit: Payer: Self-pay | Admitting: Cardiology

## 2011-09-16 DIAGNOSIS — I2510 Atherosclerotic heart disease of native coronary artery without angina pectoris: Secondary | ICD-10-CM

## 2011-09-16 DIAGNOSIS — E663 Overweight: Secondary | ICD-10-CM

## 2011-09-16 DIAGNOSIS — I10 Essential (primary) hypertension: Secondary | ICD-10-CM

## 2011-09-16 NOTE — Assessment & Plan Note (Signed)
Coronary disease is stable.  He is not having symptoms.  His lipids are being watched carefully.  He would prefer not to proceed with an exercise test at this point.  Is not having any significant symptoms.  We will follow him clinically.

## 2011-09-16 NOTE — Assessment & Plan Note (Signed)
Patient has lost 14 pounds on his own and he looks good.  He hopes to lose more weight.

## 2011-09-16 NOTE — Assessment & Plan Note (Signed)
Blood pressure is well controlled.  No change in therapy.  See back in one year.

## 2011-09-16 NOTE — Progress Notes (Signed)
HPI Patient is here for cardiology followup. I saw him last November, 2011.  He has coronary disease.  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  He has remained on aspirin and Plavix and he has done well.  When I saw him last year we talked about a nuclear study as it had been 5 years since his procedure.  He never followed through on this.  He said that he was worried about walking on the treadmill but he now knows study can be done pharmacologically.  He is very active.  He doesn't courier work.  This includes climbing several steps of stairs repeatedly during the day and he does well with this.  He has not had any chest pain or shortness of breath.  He has no syncope or presyncope.  As part of today's evaluation I have reviewed my old records and I have updated the current new EMR   .No Known Allergies  Current Outpatient Prescriptions  Medication Sig Dispense Refill  . amLODipine (NORVASC) 10 MG tablet Take 10 mg by mouth daily.        Marland Kitchen aspirin 325 MG tablet Take 325 mg by mouth daily.        Marland Kitchen atorvastatin (LIPITOR) 80 MG tablet Take 40 mg by mouth daily.        . clopidogrel (PLAVIX) 75 MG tablet Take 75 mg by mouth daily.        . fenofibrate (TRICOR) 145 MG tablet Take 145 mg by mouth daily.        . hydrochlorothiazide (HYDRODIURIL) 25 MG tablet Take 25 mg by mouth daily.        Marland Kitchen lisinopril (PRINIVIL,ZESTRIL) 40 MG tablet Take 40 mg by mouth daily.        . metoprolol (TOPROL-XL) 100 MG 24 hr tablet Take 100 mg by mouth daily.        . nitroGLYCERIN (NITROSTAT) 0.4 MG SL tablet Place 0.4 mg under the tongue every 5 (five) minutes as needed.          History   Social History  . Marital Status: Married    Spouse Name: N/A    Number of Children: N/A  . Years of Education: N/A   Occupational History  . Not on file.   Social History Main Topics  . Smoking status: Never Smoker   . Smokeless tobacco: Not on file  . Alcohol Use: No  . Drug Use: Not on  file  . Sexually Active: Not on file   Other Topics Concern  . Not on file   Social History Narrative  . No narrative on file    No family history on file.  Past Medical History  Diagnosis Date  . Hypertension   . CAD (coronary artery disease)     PCI   2006, complex bifurcation lesion, use Plavix indefinitely  . Dyslipidemia   . Overweight   . GERD (gastroesophageal reflux disease)     No past surgical history on file.  ROS  Patient denies fever, chills, headache, sweats, rash, she, change in hearing, chest pain, cough, nausea vomiting, urinary symptoms.  All other systems are reviewed and are negative.  PHYSICAL EXAM Patient is oriented to person time and place.  Affect is normal.  Head is atraumatic.  There is no xanthelasma.  No carotid bruits.  His venous distention.  Lungs are clear.  Respiratory effort is nonlabored.  Cardiac exam reveals S1-S2.  No clicks or  significant murmurs.  The abdomen is soft.  There is no peripheral edema.  There are no musculoskeletal deformities.  No skin rashes. There were no vitals filed for this visit.  EEKG is done today and reviewed by me.  Her sinus rhythm small Q waves in leads one and aVL.  There is no significant change. ASSESSMENT & PLAN

## 2011-09-16 NOTE — Patient Instructions (Signed)
Your physician wants you to follow-up in:  12 months.  You will receive a reminder letter in the mail two months in advance. If you don't receive a letter, please call our office to schedule the follow-up appointment.   

## 2011-09-26 ENCOUNTER — Other Ambulatory Visit: Payer: Self-pay | Admitting: Cardiology

## 2011-10-14 ENCOUNTER — Other Ambulatory Visit: Payer: Self-pay | Admitting: Cardiology

## 2011-11-11 ENCOUNTER — Other Ambulatory Visit: Payer: Self-pay | Admitting: Cardiology

## 2012-01-24 ENCOUNTER — Telehealth: Payer: Self-pay | Admitting: Cardiology

## 2012-01-24 NOTE — Telephone Encounter (Signed)
Increased fatigue and sob like in past, CAD. Denies chest pain but occasional tightness but doesn't need nitro. Dr Myrtis Ser not available, App with Tyrone Sage NP, App set and to go to er with any further Cp, pt agreed to plan.

## 2012-01-24 NOTE — Telephone Encounter (Signed)
New msg Pt called and said he has been fatigued and sob over last several weeks. Please call

## 2012-01-28 ENCOUNTER — Ambulatory Visit (INDEPENDENT_AMBULATORY_CARE_PROVIDER_SITE_OTHER): Payer: BC Managed Care – PPO | Admitting: Nurse Practitioner

## 2012-01-28 ENCOUNTER — Encounter: Payer: Self-pay | Admitting: Nurse Practitioner

## 2012-01-28 ENCOUNTER — Telehealth: Payer: Self-pay | Admitting: Cardiology

## 2012-01-28 ENCOUNTER — Ambulatory Visit: Payer: BC Managed Care – PPO | Admitting: Nurse Practitioner

## 2012-01-28 VITALS — BP 122/82 | HR 65 | Ht 68.0 in | Wt 212.0 lb

## 2012-01-28 DIAGNOSIS — I2510 Atherosclerotic heart disease of native coronary artery without angina pectoris: Secondary | ICD-10-CM

## 2012-01-28 DIAGNOSIS — R0609 Other forms of dyspnea: Secondary | ICD-10-CM | POA: Insufficient documentation

## 2012-01-28 DIAGNOSIS — R0989 Other specified symptoms and signs involving the circulatory and respiratory systems: Secondary | ICD-10-CM

## 2012-01-28 DIAGNOSIS — I10 Essential (primary) hypertension: Secondary | ICD-10-CM

## 2012-01-28 DIAGNOSIS — R0600 Dyspnea, unspecified: Secondary | ICD-10-CM | POA: Insufficient documentation

## 2012-01-28 LAB — BASIC METABOLIC PANEL
BUN: 24 mg/dL — ABNORMAL HIGH (ref 6–23)
CO2: 26 mEq/L (ref 19–32)
Calcium: 9.7 mg/dL (ref 8.4–10.5)
Chloride: 107 mEq/L (ref 96–112)
Creatinine, Ser: 1.6 mg/dL — ABNORMAL HIGH (ref 0.4–1.5)
GFR: 45.43 mL/min — ABNORMAL LOW (ref >60.00)
Glucose, Bld: 95 mg/dL (ref 70–99)
Potassium: 3.8 mEq/L (ref 3.5–5.1)
Sodium: 141 mEq/L (ref 135–145)

## 2012-01-28 LAB — CBC WITH DIFFERENTIAL/PLATELET
Basophils Absolute: 0 10*3/uL (ref 0.0–0.1)
Basophils Relative: 1 % (ref 0.0–3.0)
Eosinophils Absolute: 0.2 10*3/uL (ref 0.0–0.7)
Eosinophils Relative: 3.6 % (ref 0.0–5.0)
HCT: 27.6 % — ABNORMAL LOW (ref 39.0–52.0)
Hemoglobin: 8.8 g/dL — ABNORMAL LOW (ref 13.0–17.0)
Lymphocytes Relative: 23.6 % (ref 12.0–46.0)
Lymphs Abs: 1.1 10*3/uL (ref 0.7–4.0)
MCHC: 31.8 g/dL (ref 30.0–36.0)
MCV: 71.2 fl — ABNORMAL LOW (ref 78.0–100.0)
Monocytes Absolute: 0.5 10*3/uL (ref 0.1–1.0)
Monocytes Relative: 9.9 % (ref 3.0–12.0)
Neutro Abs: 3 10*3/uL (ref 1.4–7.7)
Neutrophils Relative %: 61.9 % (ref 43.0–77.0)
Platelets: 261 10*3/uL (ref 150.0–400.0)
RBC: 3.87 Mil/uL — ABNORMAL LOW (ref 4.22–5.81)
RDW: 16.8 % — ABNORMAL HIGH (ref 11.5–14.6)
WBC: 4.8 10*3/uL (ref 4.5–10.5)

## 2012-01-28 LAB — BRAIN NATRIURETIC PEPTIDE: Pro B Natriuretic peptide (BNP): 139 pg/mL — ABNORMAL HIGH (ref 0.0–100.0)

## 2012-01-28 NOTE — Telephone Encounter (Signed)
Fu call °Patient returning your call °

## 2012-01-28 NOTE — Assessment & Plan Note (Signed)
No return of chest pain. Have arranged for Myoview. EKG is unchanged.

## 2012-01-28 NOTE — Telephone Encounter (Signed)
FU Call: Pt returning call to Peter [name]. Please return pt call to discuss further.

## 2012-01-28 NOTE — Telephone Encounter (Signed)
Peter [name] wanted to let us know that he has an appt with his pcp tomorrow at 11:00am.  He wanted to know if he needs to lay down until his appt or if he can just relax sitting in his recliner.  I told him activity as tolerated due to his fatigue.

## 2012-01-28 NOTE — Assessment & Plan Note (Signed)
Patient presents with a one month history of DOE. No chest pain/tightness. Reportedly with a normal EF. Sounds like this is more a conditioning issue. He does not engage in any regular exercise. Will arrange for a Lexiscan. No change with his medicines. We are checking labs today. I will see him back in 2 weeks. Patient is agreeable to this plan and will call if any problems develop in the interim.

## 2012-01-28 NOTE — Patient Instructions (Signed)
We are going to check some labs today.  We are going to arrange for a stress test (Lexiscan) - hopefully this week or early next week.  Stay on your current medicines.

## 2012-01-28 NOTE — Assessment & Plan Note (Signed)
Blood pressure is ok on his current regimen.

## 2012-01-28 NOTE — Progress Notes (Signed)
Peter [name] Date of Birth: Dec 14, 1945 Medical Record #[id number]  History of Present Illness: Peter [name] is seen today for a work in visit. He is seen for Dr. Myrtis Ser. He has a history of complex stenting of the LAD back in 2006. He has reported good LV function. He has not had repeat stress testing. Other problems include HTN, HLD and obesity.  He comes in today. He is here with his wife. He has noted for about the last month worsening DOE. He does not get any regular exercise due to knee pain. He has gained weight. He notes that going up steps makes him short winded. 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 He is fine at rest. No swelling. No PND/orthopnea. No cough reported. He has not had any chest pain or tightness like he had with his event back in 2006. No NTG use. He is taking his medicines as prescribed.  He is worried.   Current Outpatient Prescriptions on File Prior to Visit  Medication Sig Dispense Refill  . amLODipine (NORVASC) 10 MG tablet take 1 tablet by mouth once daily  90 tablet  3  . aspirin 325 MG tablet Take 325 mg by mouth daily.        Marland Kitchen atorvastatin (LIPITOR) 80 MG tablet take 1/2 tablet by mouth once daily  90 tablet  3  . clopidogrel (PLAVIX) 75 MG tablet take 1 tablet by mouth once daily  90 tablet  3  . fenofibrate (TRICOR) 145 MG tablet Take 145 mg by mouth daily.        . hydrochlorothiazide (HYDRODIURIL) 25 MG tablet Take 25 mg by mouth daily.        Marland Kitchen lisinopril (PRINIVIL,ZESTRIL) 40 MG tablet Take 40 mg by mouth daily.        . metoprolol (TOPROL-XL) 100 MG 24 hr tablet take 1 tablet by mouth once daily  90 tablet  3  . nitroGLYCERIN (NITROSTAT) 0.4 MG SL tablet Place 0.4 mg under the tongue every 5 (five) minutes as needed.          No Known Allergies  Past Medical History  Diagnosis Date  . Hypertension   . CAD (coronary artery disease)     PCI  to the LAD 2006, complex bifurcation lesion, use Plavix  indefinitely  . Dyslipidemia   . Overweight   . GERD (gastroesophageal reflux disease)     History reviewed. No pertinent past surgical history.  History  Smoking status  . Never Smoker   Smokeless tobacco  . Not on file    History  Alcohol Use No    History reviewed. No pertinent family history.  Review of Systems: The review of systems is per the HPI. No fever or chills. No night sweats. Not dizzy or lightheaded.  All other systems were reviewed and are negative.  Physical Exam: BP 122/82  Pulse 65  Ht 5\' 8"  (1.727 m)  Wt 212 lb (96.163 kg)  BMI 32.23 kg/m2  SpO2 97% Patient is very pleasant and in no acute distress. He is overweight. Skin is warm and dry. Color is normal.  HEENT is unremarkable. Normocephalic/atraumatic. PERRL. Sclera are nonicteric. Neck is supple. No masses. No JVD. Lungs are clear. Cardiac exam shows a regular rate and rhythm. Abdomen is soft. Extremities are without edema. Gait and ROM are intact. No gross neurologic deficits noted.  LABORATORY DATA: EKG shows sinus rhythm with lateral  Q's. Tracing is unchanged.    Assessment / Plan:

## 2012-02-03 ENCOUNTER — Ambulatory Visit (HOSPITAL_COMMUNITY): Payer: BC Managed Care – PPO

## 2012-02-03 ENCOUNTER — Observation Stay (HOSPITAL_COMMUNITY)
Admission: EM | Admit: 2012-02-03 | Discharge: 2012-02-05 | Disposition: A | Discharge status: Home / Self Care | Payer: BC Managed Care – PPO | Attending: Internal Medicine | Admitting: Internal Medicine

## 2012-02-03 ENCOUNTER — Other Ambulatory Visit: Payer: Self-pay

## 2012-02-03 ENCOUNTER — Emergency Department (HOSPITAL_COMMUNITY): Payer: BC Managed Care – PPO

## 2012-02-03 ENCOUNTER — Encounter (HOSPITAL_COMMUNITY): Payer: Self-pay | Admitting: Emergency Medicine

## 2012-02-03 DIAGNOSIS — K219 Gastro-esophageal reflux disease without esophagitis: Secondary | ICD-10-CM | POA: Diagnosis present

## 2012-02-03 DIAGNOSIS — R0602 Shortness of breath: Secondary | ICD-10-CM

## 2012-02-03 DIAGNOSIS — D500 Iron deficiency anemia secondary to blood loss (chronic): Principal | ICD-10-CM | POA: Diagnosis present

## 2012-02-03 DIAGNOSIS — I2510 Atherosclerotic heart disease of native coronary artery without angina pectoris: Secondary | ICD-10-CM | POA: Diagnosis present

## 2012-02-03 DIAGNOSIS — D649 Anemia, unspecified: Secondary | ICD-10-CM | POA: Diagnosis present

## 2012-02-03 DIAGNOSIS — N182 Chronic kidney disease, stage 2 (mild): Secondary | ICD-10-CM

## 2012-02-03 DIAGNOSIS — E663 Overweight: Secondary | ICD-10-CM | POA: Insufficient documentation

## 2012-02-03 DIAGNOSIS — I10 Essential (primary) hypertension: Secondary | ICD-10-CM | POA: Diagnosis present

## 2012-02-03 DIAGNOSIS — E785 Hyperlipidemia, unspecified: Secondary | ICD-10-CM | POA: Diagnosis present

## 2012-02-03 DIAGNOSIS — R0989 Other specified symptoms and signs involving the circulatory and respiratory systems: Secondary | ICD-10-CM | POA: Insufficient documentation

## 2012-02-03 DIAGNOSIS — R0609 Other forms of dyspnea: Secondary | ICD-10-CM | POA: Diagnosis present

## 2012-02-03 DIAGNOSIS — I129 Hypertensive chronic kidney disease with stage 1 through stage 4 chronic kidney disease, or unspecified chronic kidney disease: Secondary | ICD-10-CM | POA: Insufficient documentation

## 2012-02-03 DIAGNOSIS — N183 Chronic kidney disease, stage 3 unspecified: Secondary | ICD-10-CM | POA: Diagnosis present

## 2012-02-03 LAB — COMPREHENSIVE METABOLIC PANEL
ALT: 13 U/L (ref 0–53)
AST: 16 U/L (ref 0–37)
Albumin: 4.1 g/dL (ref 3.5–5.2)
Alkaline Phosphatase: 51 U/L (ref 39–117)
CO2: 25 mEq/L (ref 19–32)
Chloride: 104 mEq/L (ref 96–112)
Creatinine, Ser: 1.55 mg/dL — ABNORMAL HIGH (ref 0.50–1.35)
GFR calc non Af Amer: 45 mL/min — ABNORMAL LOW (ref >90)
Potassium: 3.7 mEq/L (ref 3.5–5.1)
Sodium: 140 mEq/L (ref 135–145)
Total Bilirubin: 0.2 mg/dL — ABNORMAL LOW (ref 0.3–1.2)

## 2012-02-03 LAB — DIFFERENTIAL
Basophils Absolute: 0 10*3/uL (ref 0.0–0.1)
Basophils Relative: 1 % (ref 0–1)
Lymphocytes Relative: 29 % (ref 12–46)
Monocytes Absolute: 0.5 10*3/uL (ref 0.1–1.0)
Neutro Abs: 3.9 10*3/uL (ref 1.7–7.7)
Neutrophils Relative %: 60 % (ref 43–77)

## 2012-02-03 LAB — RETICULOCYTES
RBC.: 3.78 MIL/uL — ABNORMAL LOW (ref 4.22–5.81)
Retic Count, Absolute: 98.3 10*3/uL (ref 19.0–186.0)
Retic Ct Pct: 2.6 % (ref 0.4–3.1)

## 2012-02-03 LAB — CARDIAC PANEL(CRET KIN+CKTOT+MB+TROPI)
CK, MB: 1.9 ng/mL (ref 0.3–4.0)
Relative Index: INVALID (ref 0.0–2.5)
Troponin I: <0.3 ng/mL (ref <0.30)
Troponin I: <0.3 ng/mL (ref <0.30)

## 2012-02-03 LAB — PRO B NATRIURETIC PEPTIDE: Pro B Natriuretic peptide (BNP): 144.7 pg/mL — ABNORMAL HIGH (ref 0–125)

## 2012-02-03 LAB — CBC
HCT: 30.5 % — ABNORMAL LOW (ref 39.0–52.0)
MCHC: 29.5 g/dL — ABNORMAL LOW (ref 30.0–36.0)
RDW: 15.9 % — ABNORMAL HIGH (ref 11.5–15.5)
WBC: 6.5 10*3/uL (ref 4.0–10.5)

## 2012-02-03 LAB — MAGNESIUM: Magnesium: 1.9 mg/dL (ref 1.5–2.5)

## 2012-02-03 LAB — D-DIMER, QUANTITATIVE: D-Dimer, Quant: 0.32 ug/mL-FEU (ref 0.00–0.48)

## 2012-02-03 LAB — POCT I-STAT TROPONIN I

## 2012-02-03 MED ORDER — ALBUTEROL SULFATE (5 MG/ML) 0.5% IN NEBU
2.5000 mg | INHALATION_SOLUTION | Freq: Four times a day (QID) | RESPIRATORY_TRACT | Status: DC
  Administered 2012-02-04 (×2): 2.5 mg via RESPIRATORY_TRACT
  Filled 2012-02-03 (×2): qty 0.5

## 2012-02-03 MED ORDER — SODIUM CHLORIDE 0.9 % IV SOLN
INTRAVENOUS | Status: DC
  Administered 2012-02-03 (×2): via INTRAVENOUS

## 2012-02-03 MED ORDER — ASPIRIN 325 MG PO TABS
325.0000 mg | ORAL_TABLET | Freq: Every day | ORAL | Status: DC
  Administered 2012-02-04 – 2012-02-05 (×2): 325 mg via ORAL
  Filled 2012-02-03 (×2): qty 1

## 2012-02-03 MED ORDER — ATORVASTATIN CALCIUM 40 MG PO TABS
40.0000 mg | ORAL_TABLET | Freq: Every day | ORAL | Status: DC
  Administered 2012-02-04 – 2012-02-05 (×2): 40 mg via ORAL
  Filled 2012-02-03 (×2): qty 1

## 2012-02-03 MED ORDER — FUROSEMIDE 40 MG PO TABS
40.0000 mg | ORAL_TABLET | Freq: Two times a day (BID) | ORAL | Status: DC
  Administered 2012-02-04 – 2012-02-05 (×3): 40 mg via ORAL
  Filled 2012-02-03 (×5): qty 1

## 2012-02-03 MED ORDER — DOCUSATE SODIUM 100 MG PO CAPS
100.0000 mg | ORAL_CAPSULE | Freq: Two times a day (BID) | ORAL | Status: DC
  Administered 2012-02-04 (×2): 100 mg via ORAL
  Filled 2012-02-03 (×4): qty 1

## 2012-02-03 MED ORDER — CLOPIDOGREL BISULFATE 75 MG PO TABS
75.0000 mg | ORAL_TABLET | Freq: Every day | ORAL | Status: DC
  Administered 2012-02-04 – 2012-02-05 (×2): 75 mg via ORAL
  Filled 2012-02-03 (×3): qty 1

## 2012-02-03 MED ORDER — ALBUTEROL SULFATE (5 MG/ML) 0.5% IN NEBU: 2.5000 mg | INHALATION_SOLUTION | RESPIRATORY_TRACT | Status: DC | PRN

## 2012-02-03 MED ORDER — NITROGLYCERIN 0.4 MG SL SUBL: 0.4000 mg | SUBLINGUAL_TABLET | SUBLINGUAL | Status: DC | PRN

## 2012-02-03 MED ORDER — IPRATROPIUM BROMIDE 0.02 % IN SOLN
0.5000 mg | Freq: Four times a day (QID) | RESPIRATORY_TRACT | Status: DC
  Administered 2012-02-04 (×2): 0.5 mg via RESPIRATORY_TRACT
  Filled 2012-02-03 (×2): qty 2.5

## 2012-02-03 MED ORDER — ACETAMINOPHEN 650 MG RE SUPP: 650.0000 mg | Freq: Four times a day (QID) | RECTAL | Status: DC | PRN

## 2012-02-03 MED ORDER — METOPROLOL SUCCINATE ER 100 MG PO TB24
100.0000 mg | ORAL_TABLET | Freq: Every day | ORAL | Status: DC
  Administered 2012-02-04 – 2012-02-05 (×2): 100 mg via ORAL
  Filled 2012-02-03 (×2): qty 1

## 2012-02-03 MED ORDER — MORPHINE SULFATE 2 MG/ML IJ SOLN: 2.0000 mg | INTRAMUSCULAR | Status: DC | PRN

## 2012-02-03 MED ORDER — SODIUM CHLORIDE 0.9 % IV SOLN: INTRAVENOUS | Status: DC

## 2012-02-03 MED ORDER — AMLODIPINE BESYLATE 10 MG PO TABS
10.0000 mg | ORAL_TABLET | Freq: Every day | ORAL | Status: DC
  Administered 2012-02-04 – 2012-02-05 (×2): 10 mg via ORAL
  Filled 2012-02-03 (×2): qty 1

## 2012-02-03 MED ORDER — FENOFIBRATE 54 MG PO TABS
54.0000 mg | ORAL_TABLET | Freq: Every day | ORAL | Status: DC
  Administered 2012-02-04 – 2012-02-05 (×2): 54 mg via ORAL
  Filled 2012-02-03 (×2): qty 1

## 2012-02-03 MED ORDER — ACETAMINOPHEN 325 MG PO TABS
650.0000 mg | ORAL_TABLET | Freq: Four times a day (QID) | ORAL | Status: DC | PRN
  Administered 2012-02-03 – 2012-02-04 (×2): 650 mg via ORAL
  Filled 2012-02-03: qty 1
  Filled 2012-02-03: qty 2

## 2012-02-03 MED ORDER — SODIUM CHLORIDE 0.9 % IJ SOLN
3.0000 mL | Freq: Two times a day (BID) | INTRAMUSCULAR | Status: DC
  Administered 2012-02-04 – 2012-02-05 (×2): 3 mL via INTRAVENOUS

## 2012-02-03 MED ORDER — HYDROCODONE-ACETAMINOPHEN 5-325 MG PO TABS: 1.0000 | ORAL_TABLET | ORAL | Status: DC | PRN

## 2012-02-03 MED ORDER — LISINOPRIL 20 MG PO TABS
30.0000 mg | ORAL_TABLET | Freq: Every day | ORAL | Status: DC
  Administered 2012-02-04 – 2012-02-05 (×2): 30 mg via ORAL
  Filled 2012-02-03 (×2): qty 1

## 2012-02-03 NOTE — ED Provider Notes (Signed)
History     CSN: [id number]  Arrival date & time 02/03/12  1603   First MD Initiated Contact with Peter [name] 02/03/12 1747      Chief Complaint  Peter [name] presents with  . Shortness of Breath  . Weakness    (Consider location/radiation/quality/duration/timing/severity/associated sxs/prior treatment) HPI Comments: Peter [name] is a 66 year old man with a history of prior coronary disease however, who has become progressively weak short of breath over the past few months. He had been seen by his cardiologist Dr. Myrtis [name], and laboratory testing a few weeks ago was negative. 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 He therefore called his primary care physician, Dr. Docia [name], who advised him to come to the ED. On questioning him about his prior heart attack, his principal symptom then was shortness of breath.  Peter [name] is a 66 y.o. male presenting with shortness of breath.  Shortness of Breath  The current episode started more than 2 weeks ago. The onset was gradual. The problem has been rapidly worsening. The problem is severe. The symptoms are relieved by rest. The symptoms are aggravated by activity. Associated symptoms include shortness of breath. Pertinent negatives include no chest pain, no chest pressure, no fever, no cough and no wheezing. He was not exposed to toxic fumes. There were no sick contacts. Recently, medical care has been given by the PCP and by a specialist. Services received include tests performed.    Past Medical History  Diagnosis Date  . Hypertension   . CAD (coronary artery disease)     PCI  to the LAD 2006, complex bifurcation lesion, use Plavix indefinitely  . Dyslipidemia   . Overweight   . GERD (gastroesophageal reflux disease)     History reviewed. No pertinent past surgical history.  History reviewed. No pertinent family history.  History  Substance Use Topics  . Smoking status:  Never Smoker   . Smokeless tobacco: Not on file  . Alcohol Use: No      Review of Systems  Constitutional: Negative for fever.  HENT: Negative.   Eyes: Negative.   Respiratory: Positive for shortness of breath. Negative for cough and wheezing.   Cardiovascular: Negative for chest pain.  Gastrointestinal: Negative.   Genitourinary: Negative.   Musculoskeletal: Negative.   Neurological: Negative.   Psychiatric/Behavioral: Negative.     Allergies  Review of Peter [name]'s allergies indicates no known allergies.  Home Medications   Current Outpatient Rx  Name Route Sig Dispense Refill  . AMLODIPINE BESYLATE 10 MG PO TABS  take 1 tablet by mouth once daily 90 tablet 3  . ASPIRIN 325 MG PO TABS Oral Take 325 mg by mouth daily.      . ATORVASTATIN CALCIUM 80 MG PO TABS Oral Take 40 mg by mouth daily.    Marland Kitchen CLOPIDOGREL BISULFATE 75 MG PO TABS  take 1 tablet by mouth once daily 90 tablet 3  . FENOFIBRATE 145 MG PO TABS Oral Take 145 mg by mouth daily.     Marland Kitchen HYDROCHLOROTHIAZIDE 25 MG PO TABS Oral Take 25 mg by mouth daily.     Marland Kitchen LISINOPRIL 30 MG PO TABS Oral Take 30 mg by mouth daily.    Marland Kitchen METOPROLOL SUCCINATE ER 100 MG PO TB24  take 1 tablet by mouth once daily 90 tablet 3  . NITROGLYCERIN 0.4 MG SL SUBL Sublingual Place 0.4 mg under the tongue every 5 (five) minutes as  needed. For chest pain      BP 144/83  Pulse 67  Temp(Src) 98.6 F (37 [name]) (Oral)  Resp 18  SpO2 100%  Physical Exam  Constitutional: He is oriented to person, place, and time. He appears well-developed and well-nourished. No distress.  HENT:  Head: Normocephalic and atraumatic.  Right Ear: External ear normal.  Left Ear: External ear normal.  Mouth/Throat: Oropharynx is clear and moist.  Eyes: Conjunctivae and EOM are normal. Pupils are equal, round, and reactive to light.  Neck: Normal range of motion. Neck supple. No JVD present.  Cardiovascular: Normal rate, regular rhythm and normal heart sounds.     Pulmonary/Chest: Effort normal and breath sounds normal. No respiratory distress. He has no wheezes. He has no rales. He exhibits no tenderness.  Abdominal: Soft. Bowel sounds are normal.  Genitourinary: Rectum normal.       Stool negative by hemoccult.  Musculoskeletal: Normal range of motion. He exhibits no edema and no tenderness.  Neurological: He is alert and oriented to person, place, and time. He has normal reflexes.       No sensory or motor deficit.  Skin: Skin is warm and dry.  Psychiatric: He has a normal mood and affect. His behavior is normal.    ED Course  Procedures (including critical care time)  Labs Reviewed  CBC - Abnormal; Notable for the following:    RBC 4.07 (*)    Hemoglobin 9.0 (*)    HCT 30.5 (*)    MCV 74.9 (*)    MCH 22.1 (*)    MCHC 29.5 (*)    RDW 15.9 (*)    All other components within normal limits  COMPREHENSIVE METABOLIC PANEL - Abnormal; Notable for the following:    Creatinine, [name] 1.55 (*)    Total Bilirubin 0.2 (*)    GFR calc non Af Amer 45 (*)    GFR calc Af Amer 52 (*)    All other components within normal limits  DIFFERENTIAL  POCT I-STAT TROPONIN I   Dg Chest 2 View  02/03/2012  *RADIOLOGY REPORT*  Clinical Data: 66 year old male with shortness of breath.  CHEST - 2 VIEW  Comparison: 11/04/2005.  Findings: Stable lung volumes. Stable cardiomegaly and mediastinal contours.  No pneumothorax, pulmonary edema, pleural effusion or confluent pulmonary opacity. No acute osseous abnormality identified.  IMPRESSION: No acute cardiopulmonary abnormality.  Original Report Authenticated By: Harley Hallmark, M.D.    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    ST/T Wave abnormalities: normal  Conduction Disutrbances:none  Narrative Interpretation: Abnormal EKG.  Old EKG  Reviewed: unchanged  6:45 PM He was seen and had physical examination. Laboratory tests were ordered. Old charts were reviewed. EKG was nonacute.  1. Shortness of breath   2. Anemia             Peter Cooper III, MD 02/03/12 2124

## 2012-02-03 NOTE — ED Notes (Signed)
Will assume care of pt at this time. Pt resting quietly with family at bedside, pt denies any pain, n/v or shortness of breath. Plan of care is updated with verbal understanding, meal tray given per Dr. Ignacia Palma. Will continue to monitor pt.

## 2012-02-03 NOTE — H&P (Signed)
 Peter [name] is an 66 y.o. male.   Chief Complaint: Dyspnea on Exertion HPI: Patient here with progressive dyspnea on exertion, no chest pain. He had a similar episode years ago (2006) when he was found to have acute coronary syndrome and had a cardiac cath done with stenting. His enzymes and EKG were normal but has hemoglobin of 9.0 but no melena, no BRBPR, no hematemesis. Patient is worried that he moght be having another heart attack. BNP is hoever, not elevated but his Creatinine is mildly elevated.  Past Medical History  Diagnosis Date  . Hypertension   . CAD (coronary artery disease)     PCI  to the LAD 2006, complex bifurcation lesion, use Plavix indefinitely  . Dyslipidemia   . Overweight   . GERD (gastroesophageal reflux disease)     History reviewed. No pertinent past surgical history.  History reviewed. No pertinent family history. Social History:  reports that he has never smoked. He does not have any smokeless tobacco history on file. He reports that he does not drink alcohol or use illicit drugs.  Allergies: No Known Allergies  Medications Prior to Admission  Medication Dose Route Frequency Provider Last Rate Last Dose  . 0.9 %  sodium chloride infusion   Intravenous Continuous Carleene Cooper III, MD 125 mL/hr at 02/03/12 1905    . 0.9 %  sodium chloride infusion   Intravenous STAT Carleene Cooper III, MD      . acetaminophen (TYLENOL) tablet 650 mg  650 mg Oral Q6H PRN Rometta Emery, MD       Or  . acetaminophen (TYLENOL) suppository 650 mg  650 mg Rectal Q6H PRN Rometta Emery, MD      . albuterol (PROVENTIL) (5 MG/ML) 0.5% nebulizer solution 2.5 mg  2.5 mg Nebulization Q6H Rometta Emery, MD      . albuterol (PROVENTIL) (5 MG/ML) 0.5% nebulizer solution 2.5 mg  2.5 mg Nebulization Q2H PRN Rometta Emery, MD      . amLODipine (NORVASC) tablet 10 mg  10 mg Oral Daily Rometta Emery, MD      . aspirin tablet 325 mg  325 mg Oral Daily Rometta Emery, MD        . atorvastatin (LIPITOR) tablet 40 mg  40 mg Oral Daily Rometta Emery, MD      . clopidogrel (PLAVIX) tablet 75 mg  75 mg Oral Q breakfast Rometta Emery, MD      . docusate sodium (COLACE) capsule 100 mg  100 mg Oral BID Rometta Emery, MD      . fenofibrate tablet 54 mg  54 mg Oral Daily Rometta Emery, MD      . furosemide (LASIX) tablet 40 mg  40 mg Oral BID Rometta Emery, MD      . HYDROcodone-acetaminophen (NORCO) 5-325 MG per tablet 1-2 tablet  1-2 tablet Oral Q4H PRN Rometta Emery, MD      . ipratropium (ATROVENT) nebulizer solution 0.5 mg  0.5 mg Nebulization Q6H Rometta Emery, MD      . lisinopril (PRINIVIL,ZESTRIL) tablet 30 mg  30 mg Oral Daily Rometta Emery, MD      . metoprolol succinate (TOPROL-XL) 24 hr tablet 100 mg  100 mg Oral Daily Rometta Emery, MD      . morphine 2 MG/ML injection 2 mg  2 mg Intravenous Q4H PRN Rometta Emery, MD      . nitroGLYCERIN (NITROSTAT)  SL tablet 0.4 mg  0.4 mg Sublingual Q5 min PRN Rometta Emery, MD      . sodium chloride 0.9 % injection 3 mL  3 mL Intravenous Q12H Rometta Emery, MD       Medications Prior to Admission  Medication Sig Dispense Refill  . amLODipine (NORVASC) 10 MG tablet take 1 tablet by mouth once daily  90 tablet  3  . aspirin 325 MG tablet Take 325 mg by mouth daily.        . clopidogrel (PLAVIX) 75 MG tablet take 1 tablet by mouth once daily  90 tablet  3  . fenofibrate (TRICOR) 145 MG tablet Take 145 mg by mouth daily.       . hydrochlorothiazide (HYDRODIURIL) 25 MG tablet Take 25 mg by mouth daily.       . metoprolol (TOPROL-XL) 100 MG 24 hr tablet take 1 tablet by mouth once daily  90 tablet  3  . nitroGLYCERIN (NITROSTAT) 0.4 MG SL tablet Place 0.4 mg under the tongue every 5 (five) minutes as needed. For chest pain        Results for orders placed during the hospital encounter of 02/03/12 (from the past 48 hour(s))  CBC     Status: Abnormal   Collection Time   02/03/12  4:53 PM       Component Value Range Comment   WBC 6.5  4.0 - 10.5 (K/uL)    RBC 4.07 (*) 4.22 - 5.81 (MIL/uL)    Hemoglobin 9.0 (*) 13.0 - 17.0 (g/dL)    HCT 45.4 (*) 09.8 - 52.0 (%)    MCV 74.9 (*) 78.0 - 100.0 (fL)    MCH 22.1 (*) 26.0 - 34.0 (pg)    MCHC 29.5 (*) 30.0 - 36.0 (g/dL)    RDW 11.9 (*) 14.7 - 15.5 (%)    Platelets 292  150 - 400 (K/uL)   DIFFERENTIAL     Status: Normal   Collection Time   02/03/12  4:53 PM      Component Value Range Comment   Neutrophils Relative 60  43 - 77 (%)    Neutro Abs 3.9  1.7 - 7.7 (K/uL)    Lymphocytes Relative 29  12 - 46 (%)    Lymphs Abs 1.9  0.7 - 4.0 (K/uL)    Monocytes Relative 8  3 - 12 (%)    Monocytes Absolute 0.5  0.1 - 1.0 (K/uL)    Eosinophils Relative 3  0 - 5 (%)    Eosinophils Absolute 0.2  0.0 - 0.7 (K/uL)    Basophils Relative 1  0 - 1 (%)    Basophils Absolute 0.0  0.0 - 0.1 (K/uL)   COMPREHENSIVE METABOLIC PANEL     Status: Abnormal   Collection Time   02/03/12  4:53 PM      Component Value Range Comment   Sodium 140  135 - 145 (mEq/L)    Potassium 3.7  3.5 - 5.1 (mEq/L)    Chloride 104  96 - 112 (mEq/L)    CO2 25  19 - 32 (mEq/L)    Glucose, Bld 89  70 - 99 (mg/dL)    BUN 23  6 - 23 (mg/dL)    Creatinine, Ser 8.29 (*) 0.50 - 1.35 (mg/dL)    Calcium 9.7  8.4 - 10.5 (mg/dL)    Total Protein 7.3  6.0 - 8.3 (g/dL)    Albumin 4.1  3.5 - 5.2 (g/dL)    AST  16  0 - 37 (U/L)    ALT 13  0 - 53 (U/L)    Alkaline Phosphatase 51  39 - 117 (U/L)    Total Bilirubin 0.2 (*) 0.3 - 1.2 (mg/dL)    GFR calc non Af Amer 45 (*) >90 (mL/min)    GFR calc Af Amer 52 (*) >90 (mL/min)   POCT I-STAT TROPONIN I     Status: Normal   Collection Time   02/03/12  5:06 PM      Component Value Range Comment   Troponin i, poc 0.00  0.00 - 0.08 (ng/mL)    Comment 3            CARDIAC PANEL(CRET KIN+CKTOT+MB+TROPI)     Status: Normal   Collection Time   02/03/12  6:54 PM      Component Value Range Comment   Total CK 66  7 - 232 (U/L)    CK, MB 1.9  0.3 -  4.0 (ng/mL)    Troponin I <0.30  <0.30 (ng/mL)    Relative Index RELATIVE INDEX IS INVALID  0.0 - 2.5    PRO B NATRIURETIC PEPTIDE     Status: Abnormal   Collection Time   02/03/12  6:54 PM      Component Value Range Comment   Pro B Natriuretic peptide (BNP) 144.7 (*) 0 - 125 (pg/mL)   D-DIMER, QUANTITATIVE     Status: Normal   Collection Time   02/03/12  6:58 PM      Component Value Range Comment   D-Dimer, Quant 0.32  0.00 - 0.48 (ug/mL-FEU)   RETICULOCYTES     Status: Abnormal   Collection Time   02/03/12  6:58 PM      Component Value Range Comment   Retic Ct Pct 2.6  0.4 - 3.1 (%)    RBC. 3.78 (*) 4.22 - 5.81 (MIL/uL)    Retic Count, Manual 98.3  19.0 - 186.0 (K/uL)    Dg Chest 2 View  02/03/2012  *RADIOLOGY REPORT*  Clinical Data: 66 year old male with shortness of breath.  CHEST - 2 VIEW  Comparison: 11/04/2005.  Findings: Stable lung volumes. Stable cardiomegaly and mediastinal contours.  No pneumothorax, pulmonary edema, pleural effusion or confluent pulmonary opacity. No acute osseous abnormality identified.  IMPRESSION: No acute cardiopulmonary abnormality.  Original Report Authenticated By: Harley Hallmark, M.D.    Review of Systems  Constitutional: Positive for malaise/fatigue.  Respiratory: Positive for shortness of breath.   Cardiovascular: Positive for orthopnea and PND. Negative for chest pain, palpitations, claudication and leg swelling.  All other systems reviewed and are negative.    Blood pressure 123/77, pulse 80, temperature 98.4 F (36.9 C), temperature source Oral, resp. rate 18, height 5\' 8"  (1.727 m), weight 95.1 kg (209 lb 10.5 oz), SpO2 100.00%. Physical Exam  Constitutional: He is oriented to person, place, and time. He appears well-developed and well-nourished.  HENT:  Head: Normocephalic and atraumatic.  Right Ear: External ear normal.  Left Ear: External ear normal.  Nose: Nose normal.  Mouth/Throat: Oropharynx is clear and moist.  Eyes:  Conjunctivae and EOM are normal. Pupils are equal, round, and reactive to light.  Neck: Normal range of motion. Neck supple.  Cardiovascular: Normal rate, regular rhythm, normal heart sounds and intact distal pulses.   Respiratory: No respiratory distress. He has no wheezes. He has rales. He exhibits no tenderness.  GI: Soft. Bowel sounds are normal.  Musculoskeletal: Normal range of motion.  Neurological: He is  alert and oriented to person, place, and time. He has normal reflexes.  Skin: Skin is warm and dry.  Psychiatric: He has a normal mood and affect. His behavior is normal. Judgment and thought content normal.     Assessment/Plan 1. Dyspnea on exertion: Probably CHF in the setting of anemia. He is however, not where he will need transfusion. We will admit to rule out MI, Diuretics, BB and ACEI. Will check 2D echo unless there is a recent one. Will consult LHC in AM. 2. Anemia: cause unclear. Check fecal occult blood, PPI, anemia panel, transfuse if hb falls to less than 8.0.  3. CAD: Cycle enzymes, Tele monitoring 4. HTN: Controlled 5. CKDII: Follow renal function  ,LAWAL 02/03/2012, 11:10 PM

## 2012-02-03 NOTE — ED Notes (Signed)
[id number] Ready 

## 2012-02-03 NOTE — ED Notes (Signed)
Pt c/o SOB and generalized weakness that became worse today; pt sts hx of anemia

## 2012-02-04 DIAGNOSIS — I517 Cardiomegaly: Secondary | ICD-10-CM

## 2012-02-04 DIAGNOSIS — D500 Iron deficiency anemia secondary to blood loss (chronic): Secondary | ICD-10-CM | POA: Diagnosis present

## 2012-02-04 HISTORY — PX: TRANSTHORACIC ECHOCARDIOGRAM: SHX275

## 2012-02-04 LAB — LACTATE DEHYDROGENASE: LDH: 151 U/L (ref 94–250)

## 2012-02-04 LAB — FOLATE: Folate: 8.4 ng/mL

## 2012-02-04 LAB — COMPREHENSIVE METABOLIC PANEL
AST: 16 U/L (ref 0–37)
Alkaline Phosphatase: 45 U/L (ref 39–117)
CO2: 22 mEq/L (ref 19–32)
Chloride: 107 mEq/L (ref 96–112)
Creatinine, Ser: 1.26 mg/dL (ref 0.50–1.35)
GFR calc non Af Amer: 58 mL/min — ABNORMAL LOW (ref >90)
Potassium: 3.5 mEq/L (ref 3.5–5.1)
Total Bilirubin: 0.2 mg/dL — ABNORMAL LOW (ref 0.3–1.2)

## 2012-02-04 LAB — IRON AND TIBC: Saturation Ratios: 3 % — ABNORMAL LOW (ref 20–55)

## 2012-02-04 LAB — CBC
HCT: 27.2 % — ABNORMAL LOW (ref 39.0–52.0)
MCV: 74.3 fL — ABNORMAL LOW (ref 78.0–100.0)
Platelets: 222 10*3/uL (ref 150–400)
RBC: 3.66 MIL/uL — ABNORMAL LOW (ref 4.22–5.81)
WBC: 5.2 10*3/uL (ref 4.0–10.5)

## 2012-02-04 LAB — HAPTOGLOBIN: Haptoglobin: 130 mg/dL (ref 45–215)

## 2012-02-04 LAB — CARDIAC PANEL(CRET KIN+CKTOT+MB+TROPI)
CK, MB: 2.1 ng/mL (ref 0.3–4.0)
Troponin I: <0.3 ng/mL (ref <0.30)

## 2012-02-04 LAB — TSH: TSH: 1.767 u[IU]/mL (ref 0.350–4.500)

## 2012-02-04 LAB — VITAMIN B12: Vitamin B-12: 303 pg/mL (ref 211–911)

## 2012-02-04 LAB — ABO/RH: ABO/RH(D): O POS

## 2012-02-04 MED ORDER — PANTOPRAZOLE SODIUM 40 MG PO TBEC
40.0000 mg | DELAYED_RELEASE_TABLET | Freq: Every day | ORAL | Status: DC
  Administered 2012-02-04: 40 mg via ORAL
  Filled 2012-02-04: qty 1

## 2012-02-04 MED ORDER — SODIUM CHLORIDE 0.9 % IV SOLN
25.0000 mg | Freq: Once | INTRAVENOUS | Status: AC
  Administered 2012-02-04: 25 mg via INTRAVENOUS
  Filled 2012-02-04: qty 2

## 2012-02-04 MED ORDER — ZOLPIDEM TARTRATE 5 MG PO TABS
5.0000 mg | ORAL_TABLET | Freq: Once | ORAL | Status: AC
  Administered 2012-02-04: 5 mg via ORAL
  Filled 2012-02-04: qty 1

## 2012-02-04 MED ORDER — ALBUTEROL SULFATE HFA 108 (90 BASE) MCG/ACT IN AERS
2.0000 | INHALATION_SPRAY | RESPIRATORY_TRACT | Status: DC | PRN
Start: 2012-02-04 — End: 2012-02-05
  Filled 2012-02-04: qty 6.7

## 2012-02-04 MED ORDER — SODIUM CHLORIDE 0.9 % IV SOLN
125.0000 mg | Freq: Once | INTRAVENOUS | Status: AC
  Administered 2012-02-04: 125 mg via INTRAVENOUS
  Filled 2012-02-04 (×2): qty 10

## 2012-02-04 NOTE — Progress Notes (Signed)
Utilization Review Completed.[name] T3/26/2013   

## 2012-02-04 NOTE — Progress Notes (Signed)
Subjective:  66 yo man with CAD , hx of stent presented with DOE. Found to have profound iron deficiency anemia but no active bleeding Currently without chest pain      Physical Exam: Blood pressure 116/74, pulse 59, temperature 97.6 F (36.4 C), temperature source Oral, resp. rate 18, height 5\' 8"  (1.727 m), weight 95.1 kg (209 lb 10.5 oz), SpO2 100.00%.  Alert and oriented x3 Cvs: rrr Rs: ctab  Abdomen : soft, nt Skin pale  Investigations:  No results found for this or any previous visit (from the past 240 hour(s)).   Basic Metabolic Panel:  Basename 02/04/12 0558 02/03/12 2308 02/03/12 1653  NA 140 -- 140  K 3.5 -- 3.7  CL 107 -- 104  CO2 22 -- 25  GLUCOSE 82 -- 89  BUN 18 -- 23  CREATININE 1.26 -- 1.55*  CALCIUM 8.7 -- 9.7  MG -- 1.9 --  PHOS -- -- --   Liver Function Tests:  [facility] 02/04/12 0558 02/03/12 1653  AST 16 16  ALT 12 13  ALKPHOS 45 51  BILITOT 0.2* 0.2*  PROT 6.1 7.3  ALBUMIN 3.5 4.1     CBC:  Basename 02/04/12 0558 02/03/12 1653  WBC 5.2 6.5  NEUTROABS -- 3.9  HGB 7.7* 9.0*  HCT 27.2* 30.5*  MCV 74.3* 74.9*  PLT 222 292    Dg Chest 2 View  02/03/2012  *RADIOLOGY REPORT*  Clinical Data: 66 year old male with shortness of breath.  CHEST - 2 VIEW  Comparison: 11/04/2005.  Findings: Stable lung volumes. Stable cardiomegaly and mediastinal contours.  No pneumothorax, pulmonary edema, pleural effusion or confluent pulmonary opacity. No acute osseous abnormality identified.  IMPRESSION: No acute cardiopulmonary abnormality.  Original Report Authenticated By: Harley Hallmark, M.D.      Medications:  Scheduled:   . amLODipine  10 mg Oral Daily  . aspirin  325 mg Oral Daily  . atorvastatin  40 mg Oral Daily  . clopidogrel  75 mg Oral Q breakfast  . docusate sodium  100 mg Oral BID  . fenofibrate  54 mg Oral Daily  . ferric gluconate (FERRLECIT/NULECIT) IV  125 mg Intravenous Once  . ferric gluconate (FERRLECIT/NULECIT) Test Dose  25  mg Intravenous Once  . furosemide  40 mg Oral BID  . lisinopril  30 mg Oral Daily  . metoprolol succinate  100 mg Oral Daily  . pantoprazole  40 mg Oral Q1200  . sodium chloride  3 mL Intravenous Q12H  . DISCONTD: sodium chloride   Intravenous STAT  . DISCONTD: albuterol  2.5 mg Nebulization Q6H  . DISCONTD: ipratropium  0.5 mg Nebulization Q6H   Continuous:   . DISCONTD: sodium chloride 125 mL/hr at 02/03/12 2355   ZOX:[id number], acetaminophen, albuterol, albuterol, HYDROcodone-acetaminophen, morphine, nitroGLYCERIN  Impression:  Principal Problem:  *Iron deficiency anemia due to chronic blood loss Active Problems:  Hypertension  CAD (coronary artery disease)  Dyslipidemia  GERD (gastroesophageal reflux disease)  Overweight  DOE (dyspnea on exertion)  Anemia  Chronic renal disease, stage II     Plan: D/W with primary GI Dr. Kinnie Scales - plan to start PPI / start iv iron / transfuse 1 unit of prbcs , obtain TTG IgA ab to r/o celiac disease Observe in hospital for now  If stable will dc tonight or tomoroow am  outpt EGd/colon  Within 1 week by Dr. Kinnie Scales     LOS: 1 day   Lonia Blood, MD Pager: [phone] 02/04/2012, 11:53 AM

## 2012-02-04 NOTE — Telephone Encounter (Signed)
New problem:  Patient schedule for colonoscopy on 4/4. Please advise on cardiac clearance, . plavix & asa 7 days prior.

## 2012-02-04 NOTE — Telephone Encounter (Signed)
Rhonda was notified

## 2012-02-04 NOTE — Telephone Encounter (Signed)
Peter [name] is scheduled for a colonoscopy on 4/4.  Is he cleared from a cardiac standpoint for this?  Also is it ok for him to stop his Plavix and ASA 7 days prior?

## 2012-02-04 NOTE — Progress Notes (Signed)
  Echocardiogram 2D Echocardiogram has been performed.  Cathie Beams Deneen 02/04/2012, 9:38 AM

## 2012-02-04 NOTE — Telephone Encounter (Signed)
I noted that the patient was actually admitted to the hospital today. Further decisions about any cardiac clearance for colonoscopy we'll have to await this hospitalization.

## 2012-02-05 ENCOUNTER — Telehealth: Payer: Self-pay | Admitting: *Deleted

## 2012-02-05 LAB — TYPE AND SCREEN
ABO/RH(D): O POS
Antibody Screen: NEGATIVE

## 2012-02-05 LAB — CBC
HCT: 31.9 % — ABNORMAL LOW (ref 39.0–52.0)
Hemoglobin: 9.6 g/dL — ABNORMAL LOW (ref 13.0–17.0)
MCHC: 30.1 g/dL (ref 30.0–36.0)
MCV: 75.6 fL — ABNORMAL LOW (ref 78.0–100.0)
RDW: 16.7 % — ABNORMAL HIGH (ref 11.5–15.5)

## 2012-02-05 MED ORDER — PANTOPRAZOLE SODIUM 40 MG PO TBEC: 40.0000 mg | DELAYED_RELEASE_TABLET | Freq: Every day | ORAL | Status: DC

## 2012-02-05 NOTE — Telephone Encounter (Signed)
Rec'd outpatient referral on patient on 02/03/12 to establish patient for anemia, however, since patient was symptomatic, informed to send to ER. Patient was admitted on 3/25 and was discharged today. Patient did receive transfusions of PRBCs and IV iron during visit, with pending a GI workup. It does not appear that an inpatient consult was ever called in for the on call MD. Called patient requesting call back to determine best time to establish in coordination with GI. Once this is determined, appts with Hematology will be established.

## 2012-02-05 NOTE — Discharge Instructions (Addendum)
Dr [facility] office will contact you if your Plavix and Asprin are to be discontinued prior to endoscopic procedures 4/4.

## 2012-02-05 NOTE — Progress Notes (Signed)
[name] to be D/C'd Home per MD order.  Discussed prescriptions and follow up appointments with the patient. Prescriptions given to patient, medication list explained in detail. Pt verbalized understanding.   Hamid, [place]  Home Medication Instructions ZOX:[id number]   Printed on:02/05/12 1052  Medication Information                    fenofibrate (TRICOR) 145 MG tablet Take 145 mg by mouth daily.            aspirin 325 MG tablet Take 325 mg by mouth daily.             hydrochlorothiazide (HYDRODIURIL) 25 MG tablet Take 25 mg by mouth daily.            nitroGLYCERIN (NITROSTAT) 0.4 MG SL tablet Place 0.4 mg under the tongue every 5 (five) minutes as needed. For chest pain           clopidogrel (PLAVIX) 75 MG tablet take 1 tablet by mouth once daily           amLODipine (NORVASC) 10 MG tablet take 1 tablet by mouth once daily           metoprolol (TOPROL-XL) 100 MG 24 hr tablet take 1 tablet by mouth once daily           atorvastatin (LIPITOR) 80 MG tablet Take 40 mg by mouth daily.           lisinopril (PRINIVIL,ZESTRIL) 30 MG tablet Take 30 mg by mouth daily.           pantoprazole (PROTONIX) 40 MG tablet Take 1 tablet (40 mg total) by mouth daily at 12 noon.             Filed Vitals:   02/05/12 0512  BP: 124/79  Pulse: 67  Temp: 98.2 F (36.8 C)  Resp: 20    Skin clean, dry and intact without evidence of skin break down, no evidence of skin tears noted. IV catheter discontinued intact. Site without signs and symptoms of complications. Dressing and pressure applied. Pt denies pain at this time. No complaints noted.  An After Visit Summary was printed and given to the patient. Patient escorted via WC, and D/C home via private auto.  Driggers, Rae Roam 02/05/2012 10:52 AM

## 2012-02-05 NOTE — Discharge Summary (Signed)
Patient ID: Peter [name] MRN: [id number] DOB/AGE: Jun 10, 1946 66 y.o.  Admit date: 02/03/2012 Discharge date: 02/05/2012  Primary Care Physician:  Darrow Bussing, MD, MD  Discharge Diagnoses:  Severe Anemia Principal Problem:  *Iron deficiency anemia due to chronic blood loss Active Problems:  Hypertension  CAD (coronary artery disease)  Dyslipidemia  GERD (gastroesophageal reflux disease)  Overweight  DOE (dyspnea on exertion)  Anemia  Chronic renal disease, stage II   Medication List  As of 02/05/2012 10:30 AM   TAKE these medications         amLODipine 10 MG tablet   Commonly known as: NORVASC   take 1 tablet by mouth once daily      aspirin 325 MG tablet   Take 325 mg by mouth daily.      atorvastatin 80 MG tablet   Commonly known as: LIPITOR   Take 40 mg by mouth daily.      clopidogrel 75 MG tablet   Commonly known as: PLAVIX   take 1 tablet by mouth once daily      fenofibrate 145 MG tablet   Commonly known as: TRICOR   Take 145 mg by mouth daily.      hydrochlorothiazide 25 MG tablet   Commonly known as: HYDRODIURIL   Take 25 mg by mouth daily.      lisinopril 30 MG tablet   Commonly known as: PRINIVIL,ZESTRIL   Take 30 mg by mouth daily.      metoprolol succinate 100 MG 24 hr tablet   Commonly known as: TOPROL-XL   take 1 tablet by mouth once daily      nitroGLYCERIN 0.4 MG SL tablet   Commonly known as: NITROSTAT   Place 0.4 mg under the tongue every 5 (five) minutes as needed. For chest pain      pantoprazole 40 MG tablet   Commonly known as: PROTONIX   Take 1 tablet (40 mg total) by mouth daily at 12 noon.            Consults:   Brief H and P: From the admission note:  HPI: Patient here with progressive dyspnea on exertion, no chest pain. He had a similar episode years ago (2006) when he was found to have acute coronary syndrome and had a cardiac cath done with stenting. His enzymes and EKG were normal but has hemoglobin of 9.0 but no  melena, no BRBPR, no hematemesis. Patient is worried that he moght be having another heart attack. BNP is however, not elevated but his Creatinine is mildly elevated.  Mr. Hosier received 1 unit of PRBCs and his hgb was 7.7 on admission and rose to 9.0 after transfusion.  This morning it is 9.7.  He also received IV Iron and was started on PPI therapy.  A TTG IgA was drawn to evaluate for Celiac disease and is pending.  This morning his breathing has improved substantially and he is stable for discharge.  Dr. Lavera Guise spoke with Dr. Kinnie Scales.  He and Dr. Docia Chuck have already started an outpatient work up for anemia.  The patient is scheduled for Endoscopy and Colonoscopy on 4/4 with Dr. Kinnie Scales.   Dr. Jennye Boroughs office will determine in concert with Dr. Myrtis Ser if the patient can be off of Aspirin and Plavix for several days before endoscopic procedures.  Mr. Retter Creatinine was elevated on admission but has come down nicely with the transfusion and gentle IV hydration.  The rest of the patient's co morbidities listed above have been  quiet during this admission.  Physical Exam on Discharge:  General: Alert, awake, oriented x3, in no acute distress. HEENT: No bruits, no goiter. Heart: Regular rate and rhythm, without murmurs, rubs, gallops. Lungs: Clear to auscultation bilaterally. Abdomen: Soft, nontender, nondistended, positive bowel sounds. Extremities: No clubbing cyanosis or edema with positive pedal pulses. Neuro: Grossly intact, nonfocal.  Filed Vitals:   02/04/12 1823 02/04/12 1849 02/04/12 2105 02/05/12 0512  BP: 115/76 115/78 122/72 124/79  Pulse: 80 80 73 67  Temp: 98.2 F (36.8 C) 98.2 F (36.8 C) 98.2 F (36.8 C) 98.2 F (36.8 C)  TempSrc: Oral Oral Oral Oral  Resp: 16 16 18 20   Height:      Weight:      SpO2: 98% 98% 97% 96%     Intake/Output Summary (Last 24 hours) at 02/05/12 1030 Last data filed at 02/05/12 0912  Gross per 24 hour  Intake 1235.5 ml  Output      1 ml    Net 1234.5 ml    Basic Metabolic Panel:  Lab 02/04/12 4540 02/03/12 2308 02/03/12 1653  NA 140 -- 140  K 3.5 -- 3.7  CL 107 -- 104  CO2 22 -- 25  GLUCOSE 82 -- 89  BUN 18 -- 23  CREATININE 1.26 -- 1.55*  CALCIUM 8.7 -- 9.7  MG -- 1.9 --  PHOS -- -- --   Liver Function Tests:  Lab 02/04/12 0558 02/03/12 1653  AST 16 16  ALT 12 13  ALKPHOS 45 51  BILITOT 0.2* 0.2*  PROT 6.1 7.3  ALBUMIN 3.5 4.1   CBC:  Lab 02/05/12 0612 02/04/12 0558 02/03/12 1653  WBC 5.7 5.2 --  NEUTROABS -- -- 3.9  HGB 9.6* 7.7* --  HCT 31.9* 27.2* --  MCV 75.6* 74.3* --  PLT 231 222 --   Cardiac Enzymes:  Lab 02/04/12 1513 02/04/12 0558 02/03/12 2308  CKTOTAL 71 68 60  CKMB 2.1 2.0 1.9  CKMBINDEX -- -- --  TROPONINI <0.30 <0.30 <0.30   D-Dimer:  Lab 02/03/12 1858  DDIMER 0.32   Thyroid Function Tests:  Lab 02/03/12 2308  TSH 1.767  T4TOTAL --  FREET4 --  T3FREE --  THYROIDAB --   Anemia Panel:  Lab 02/03/12 1858  VITAMINB12 303  FOLATE 8.4  FERRITIN 5*  TIBC 552*  IRON 15*  RETICCTPCT 2.6     Significant Diagnostic Studies:  Dg Chest 2 View  02/03/2012  *RADIOLOGY REPORT*  Clinical Data: 66 year old male with shortness of breath.  CHEST - 2 VIEW  Comparison: 11/04/2005.  Findings: Stable lung volumes. Stable cardiomegaly and mediastinal contours.  No pneumothorax, pulmonary edema, pleural effusion or confluent pulmonary opacity. No acute osseous abnormality identified.  IMPRESSION: No acute cardiopulmonary abnormality.  Original Report Authenticated By: Harley Hallmark, M.D.    Disposition and Follow-up:   Discharge Orders    Future Orders Please Complete By Expires   Diet - low sodium heart healthy      Increase activity slowly      Discharge instructions      Comments:   Please follow up with Dr. Kinnie Scales as scheduled Thursday at 10:30.  Blood Test for Celiac (TTG) should be ready in Epic by then.    Also please call Dr. Kinnie Scales if you become increasingly short  of breath.   before next Thursday  - or if you see any blood in your stool.  Please follow up with your primary care physician regarding anemia as well.  Follow-up Information    Schedule an appointment as soon as possible for a visit with Darrow Bussing, MD.        Time spent on Discharge: 40 min.  SignedStephani Police 02/05/2012, 10:30 AM [phone]  Patient has been seen and examined, I agree with the assessment and plan as outlined above by Ms [place]. Patient was seen ambulating in the hallway with no difficulty, he does not have any clinical features suggestive of CHF exacerbation as the cause of his exertional dyspnea. He needs GI workup and treatment of his anemia, if he still continues to have exertional dyspnea, at some point he may need a repeat stress testing.

## 2012-02-11 ENCOUNTER — Ambulatory Visit: Payer: BC Managed Care – PPO | Admitting: Nurse Practitioner

## 2012-02-11 NOTE — Telephone Encounter (Signed)
Will need to verify with Dr Myrtis Ser

## 2012-02-11 NOTE — Telephone Encounter (Signed)
Follow up   Per Peter [name] need something in written  Regarding Plavix & ASA.

## 2012-02-12 ENCOUNTER — Encounter: Payer: Self-pay | Admitting: Cardiology

## 2012-02-12 DIAGNOSIS — R9430 Abnormal result of cardiovascular function study, unspecified: Secondary | ICD-10-CM | POA: Insufficient documentation

## 2012-02-12 NOTE — Telephone Encounter (Signed)
Pt called and states he is having a upper and lower endoscopy tomorrow.

## 2012-02-13 NOTE — Telephone Encounter (Signed)
Pt called back and was told he needs to stay on the Plavix and ASA per Dr Myrtis Ser.

## 2012-02-17 ENCOUNTER — Telehealth: Payer: Self-pay | Admitting: *Deleted

## 2012-02-17 NOTE — Telephone Encounter (Signed)
Called and left message for patient to call us back regarding scheduling his referral from Dr Docia Chuck with [facility], he had earlier asked to hold referral so he could get scheduled with Dr Kinnie Scales, before seeing our doctors here for anemia.

## 2012-02-25 ENCOUNTER — Telehealth: Payer: Self-pay | Admitting: *Deleted

## 2012-02-25 NOTE — Telephone Encounter (Signed)
Patient called back and spoke to me today, states that Dr Kinnie Scales cleared him, he has been back to primary MD, Dr Docia Chuck, and wants to try oral Iron BID before having consult for IV Iron. He returns on 03/19/12 to [place], if levels are still low, he will call us back to make consult appt. Called and informed Bobbi at [phone] of patient decision. If patient calls back, we will call for latest office notes at that time. No consult scheduled at this time.

## 2012-04-27 ENCOUNTER — Other Ambulatory Visit: Payer: Self-pay | Admitting: Cardiology

## 2012-05-19 ENCOUNTER — Emergency Department (HOSPITAL_COMMUNITY)
Admission: EM | Admit: 2012-05-19 | Discharge: 2012-05-19 | Disposition: A | Discharge status: Home / Self Care | Payer: BC Managed Care – PPO | Attending: Emergency Medicine | Admitting: Emergency Medicine

## 2012-05-19 ENCOUNTER — Encounter (HOSPITAL_COMMUNITY): Payer: Self-pay | Admitting: *Deleted

## 2012-05-19 DIAGNOSIS — E785 Hyperlipidemia, unspecified: Secondary | ICD-10-CM | POA: Insufficient documentation

## 2012-05-19 DIAGNOSIS — I10 Essential (primary) hypertension: Secondary | ICD-10-CM | POA: Insufficient documentation

## 2012-05-19 DIAGNOSIS — L03119 Cellulitis of unspecified part of limb: Secondary | ICD-10-CM

## 2012-05-19 DIAGNOSIS — L02619 Cutaneous abscess of unspecified foot: Secondary | ICD-10-CM | POA: Insufficient documentation

## 2012-05-19 DIAGNOSIS — Z79899 Other long term (current) drug therapy: Secondary | ICD-10-CM | POA: Insufficient documentation

## 2012-05-19 DIAGNOSIS — I2510 Atherosclerotic heart disease of native coronary artery without angina pectoris: Secondary | ICD-10-CM | POA: Insufficient documentation

## 2012-05-19 MED ORDER — CEPHALEXIN 500 MG PO CAPS
500.0000 mg | ORAL_CAPSULE | Freq: Once | ORAL | Status: AC
  Administered 2012-05-19: 500 mg via ORAL
  Filled 2012-05-19: qty 1

## 2012-05-19 MED ORDER — SULFAMETHOXAZOLE-TMP DS 800-160 MG PO TABS
1.0000 | ORAL_TABLET | Freq: Once | ORAL | Status: AC
  Administered 2012-05-19: 1 via ORAL
  Filled 2012-05-19: qty 1

## 2012-05-19 MED ORDER — SULFAMETHOXAZOLE-TRIMETHOPRIM 800-160 MG PO TABS: 1.0000 | ORAL_TABLET | Freq: Three times a day (TID) | ORAL | Status: AC

## 2012-05-19 MED ORDER — CEPHALEXIN 500 MG PO CAPS: 500.0000 mg | ORAL_CAPSULE | Freq: Four times a day (QID) | ORAL | Status: AC

## 2012-05-19 NOTE — ED Provider Notes (Signed)
History     CSN: [id number]  Arrival date & time 05/19/12  1349   First MD Initiated Contact with Patient 05/19/12 1508      Chief Complaint  Patient presents with  . Recurrent Skin Infections    (Consider location/radiation/quality/duration/timing/severity/associated sxs/prior treatment) The history is provided by the patient.   patient states that he was clearly outside the house this weekend with shoes on. He states that she's got when he got blisters on both of his feet. He now has increasing pain and he states a doctor he works with told him he may need antibiotics. He has some worsening of his chronic swelling in his feet. No fevers. He has had some clear drainage out of the blisters. The pain is increased with walking. He is not diabetic.  Past Medical History  Diagnosis Date  . Hypertension   . CAD (coronary artery disease)     PCI  to the LAD 2006, complex bifurcation lesion, use Plavix indefinitely  . Dyslipidemia   . Overweight   . GERD (gastroesophageal reflux disease)   . Ejection fraction     EF 55-60%, echo,  March, 2013,, no focal wall motion abnormalities    History reviewed. No pertinent past surgical history.  No family history on file.  History  Substance Use Topics  . Smoking status: Never Smoker   . Smokeless tobacco: Not on file  . Alcohol Use: No      Review of Systems  Constitutional: Negative for fever and fatigue.  Respiratory: Negative for shortness of breath.   Cardiovascular: Negative for chest pain and leg swelling.  Genitourinary: Negative for flank pain.  Skin: Positive for color change and wound.    Allergies  Review of patient's allergies indicates no known allergies.  Home Medications   Current Outpatient Rx  Name Route Sig Dispense Refill  . AMLODIPINE BESYLATE 10 MG PO TABS  take 1 tablet by mouth once daily 90 tablet 3  . ASPIRIN 325 MG PO TABS Oral Take 325 mg by mouth daily.      . ATORVASTATIN CALCIUM 80 MG PO TABS  Oral Take 40 mg by mouth daily.    Marland Kitchen CLOPIDOGREL BISULFATE 75 MG PO TABS  take 1 tablet by mouth once daily 90 tablet 3  . FENOFIBRATE 145 MG PO TABS Oral Take 145 mg by mouth daily.     Marland Kitchen FERROUS SULFATE 325 (65 FE) MG PO TABS Oral Take 325 mg by mouth 2 (two) times daily.    Marland Kitchen LISINOPRIL-HYDROCHLOROTHIAZIDE 20-12.5 MG PO TABS Oral Take 2 tablets by mouth daily.    Marland Kitchen METOPROLOL SUCCINATE ER 100 MG PO TB24  take 1 tablet by mouth once daily 90 tablet 3  . PANTOPRAZOLE SODIUM 40 MG PO TBEC Oral Take 1 tablet (40 mg total) by mouth daily at 12 noon. 30 tablet 0  . CEPHALEXIN 500 MG PO CAPS Oral Take 1 capsule (500 mg total) by mouth 4 (four) times daily. 20 capsule 0  . NITROSTAT 0.4 MG SL SUBL  place 1 tablet under the tongue if needed every 5 minutes for chest pain for 3 doses IF NO RELIEF AFTER 3RD DOSE CALL PRESCRIBER OR 911. 25 tablet 1  . SULFAMETHOXAZOLE-TRIMETHOPRIM 800-160 MG PO TABS Oral Take 1 tablet by mouth 3 (three) times daily. 15 tablet 0    BP 139/89  Pulse 71  Temp 98.9 F (37.2 C) (Oral)  Resp 20  SpO2 98%  Physical Exam  Constitutional: He appears  well-developed.  HENT:  Head: Normocephalic.  Eyes: Pupils are equal, round, and reactive to light.  Neck: Neck supple.  Cardiovascular: Normal rate.   Pulmonary/Chest: Effort normal and breath sounds normal.  Musculoskeletal: He exhibits edema and tenderness.  Neurological: He is alert.  Skin:        Proximally 3 cm blister to lateral feet bilaterally. Clear drainage from the blisters. There is also some smaller blisters on the medial right great toe. surrounding erythema on these. There is some clear drainage with possible purulence. The joints do not appear irritable.    ED Course  Procedures (including critical care time)  Labs Reviewed - No data to display No results found.   1. Cellulitis of foot       MDM  Patient has blisters on bilateral feet. The ones on his right great toe appear to be infected. he  will need antibiotics. He will follow up in 2 days.   Juliet Rude. Rubin Payor, MD 05/19/12 1529

## 2012-05-19 NOTE — ED Notes (Signed)
Pt states he was cleaning outside of his house he shoe got wet and blisters appeared on his right great toe. Pt has edema in lower ext pt states "this is nothing new". Pt states he is also having pain in his right great toe

## 2012-09-24 ENCOUNTER — Other Ambulatory Visit: Payer: Self-pay

## 2012-09-24 MED ORDER — AMLODIPINE BESYLATE 10 MG PO TABS: 10.0000 mg | ORAL_TABLET | Freq: Every day | ORAL | Status: DC

## 2012-09-24 MED ORDER — CLOPIDOGREL BISULFATE 75 MG PO TABS: 75.0000 mg | ORAL_TABLET | Freq: Every day | ORAL | Status: DC

## 2012-10-19 ENCOUNTER — Telehealth: Payer: Self-pay | Admitting: Cardiology

## 2012-10-19 ENCOUNTER — Telehealth: Payer: Self-pay

## 2012-10-19 ENCOUNTER — Other Ambulatory Visit: Payer: Self-pay

## 2012-10-19 MED ORDER — METOPROLOL SUCCINATE ER 100 MG PO TB24: 100.0000 mg | ORAL_TABLET | Freq: Every day | ORAL | Status: DC

## 2012-10-19 NOTE — Telephone Encounter (Signed)
Walk In Pt Form " Pt Needs Medication Called in" Sent to Message  Nurse 10/19/12/KM

## 2012-10-19 NOTE — Telephone Encounter (Signed)
**Note De-Identified Peter [name] Obfuscation** Left message on pt's VM that we e-scribed Metoprolol to [facility] on 12/3 and that I have spoke with pharmacist who states they did not receive refill request. I have re-ordered by phone and e-scribed again.

## 2012-11-13 ENCOUNTER — Other Ambulatory Visit: Payer: Self-pay

## 2012-11-13 MED ORDER — ATORVASTATIN CALCIUM 80 MG PO TABS: 40.0000 mg | ORAL_TABLET | Freq: Every day | ORAL | Status: DC

## 2013-01-01 ENCOUNTER — Ambulatory Visit (INDEPENDENT_AMBULATORY_CARE_PROVIDER_SITE_OTHER): Payer: BC Managed Care – PPO | Admitting: Cardiology

## 2013-01-01 ENCOUNTER — Encounter: Payer: Self-pay | Admitting: Cardiology

## 2013-01-01 VITALS — BP 148/98 | HR 85 | Ht 68.0 in | Wt 222.8 lb

## 2013-01-01 DIAGNOSIS — I10 Essential (primary) hypertension: Secondary | ICD-10-CM | POA: Diagnosis not present

## 2013-01-01 DIAGNOSIS — D649 Anemia, unspecified: Secondary | ICD-10-CM | POA: Diagnosis not present

## 2013-01-01 DIAGNOSIS — I2510 Atherosclerotic heart disease of native coronary artery without angina pectoris: Secondary | ICD-10-CM | POA: Diagnosis not present

## 2013-01-01 DIAGNOSIS — E785 Hyperlipidemia, unspecified: Secondary | ICD-10-CM | POA: Diagnosis not present

## 2013-01-01 DIAGNOSIS — R0989 Other specified symptoms and signs involving the circulatory and respiratory systems: Secondary | ICD-10-CM

## 2013-01-01 DIAGNOSIS — E663 Overweight: Secondary | ICD-10-CM | POA: Diagnosis not present

## 2013-01-01 DIAGNOSIS — R9430 Abnormal result of cardiovascular function study, unspecified: Secondary | ICD-10-CM

## 2013-01-01 LAB — CBC WITH DIFFERENTIAL/PLATELET
Basophils Absolute: 0.1 10*3/uL (ref 0.0–0.1)
Basophils Relative: 0.8 % (ref 0.0–3.0)
Eosinophils Absolute: 0.3 10*3/uL (ref 0.0–0.7)
MCHC: 33.4 g/dL (ref 30.0–36.0)
MCV: 90.5 fl (ref 78.0–100.0)
Monocytes Absolute: 0.5 10*3/uL (ref 0.1–1.0)
Neutro Abs: 3.5 10*3/uL (ref 1.4–7.7)
Neutrophils Relative %: 59.1 % (ref 43.0–77.0)
RBC: 4.75 Mil/uL (ref 4.22–5.81)
RDW: 13 % (ref 11.5–14.6)

## 2013-01-01 LAB — BASIC METABOLIC PANEL
BUN: 17 mg/dL (ref 6–23)
Creatinine, Ser: 1.4 mg/dL (ref 0.4–1.5)
GFR: 54.05 mL/min — ABNORMAL LOW (ref >60.00)
Potassium: 3.5 mEq/L (ref 3.5–5.1)

## 2013-01-01 LAB — LIPID PANEL
Cholesterol: 140 mg/dL (ref 0–200)
Triglycerides: 230 mg/dL — ABNORMAL HIGH (ref 0.0–149.0)

## 2013-01-01 MED ORDER — METOPROLOL SUCCINATE ER 100 MG PO TB24: 100.0000 mg | ORAL_TABLET | Freq: Every day | ORAL | Status: DC

## 2013-01-01 MED ORDER — CLOPIDOGREL BISULFATE 75 MG PO TABS: 75.0000 mg | ORAL_TABLET | Freq: Every day | ORAL | Status: DC

## 2013-01-01 MED ORDER — AMLODIPINE BESYLATE 10 MG PO TABS: 10.0000 mg | ORAL_TABLET | Freq: Every day | ORAL | Status: DC

## 2013-01-01 MED ORDER — NITROGLYCERIN 0.4 MG SL SUBL: 0.4000 mg | SUBLINGUAL_TABLET | SUBLINGUAL | Status: DC | PRN

## 2013-01-01 MED ORDER — ATORVASTATIN CALCIUM 80 MG PO TABS: 40.0000 mg | ORAL_TABLET | Freq: Every day | ORAL | Status: DC

## 2013-01-01 NOTE — Assessment & Plan Note (Signed)
The patient has significant coronary disease. We use aspirin and Plavix indefinitely in his case. He had complex bifurcation disease in the past. It is time to proceed with a an exercise test. He has been in active. His resting EKG is not normal. We will do a stress Myoview scan. I will be in touch with him with the information.

## 2013-01-01 NOTE — Progress Notes (Signed)
HPI  Patient is doing well today. He is active. I saw him last November, 2012. In March, 2013 he had shortness of breath. It turned out that his hemoglobin was 7.5. He had a complete evaluation. There was no obvious bleeding. He needs ongoing iron therapy. He is doing his usual activities.  He has known coronary disease. His last evaluation was 2006. Over the years we have talked about a followup exercise test. There have been various reasons over time why this has not occurred. We discussed this today.  No Known Allergies  Current Outpatient Prescriptions  Medication Sig Dispense Refill  . amLODipine (NORVASC) 10 MG tablet Take 1 tablet (10 mg total) by mouth daily.  90 tablet  1  . aspirin 325 MG tablet Take 325 mg by mouth daily.        Marland Kitchen atorvastatin (LIPITOR) 80 MG tablet Take 0.5 tablets (40 mg total) by mouth daily.  30 tablet  2  . clopidogrel (PLAVIX) 75 MG tablet Take 1 tablet (75 mg total) by mouth daily.  90 tablet  1  . fenofibrate (TRICOR) 145 MG tablet Take 145 mg by mouth daily.       . ferrous sulfate 325 (65 FE) MG tablet Take 325 mg by mouth 2 (two) times daily.      Marland Kitchen lisinopril-hydrochlorothiazide (PRINZIDE,ZESTORETIC) 20-12.5 MG per tablet Take 2 tablets by mouth daily.      . metoprolol succinate (TOPROL-XL) 100 MG 24 hr tablet Take 1 tablet (100 mg total) by mouth daily. Take with or immediately following a meal.  90 tablet  3  . NITROSTAT 0.4 MG SL tablet place 1 tablet under the tongue if needed every 5 minutes for chest pain for 3 doses IF NO RELIEF AFTER 3RD DOSE CALL PRESCRIBER OR 911.  25 tablet  1  . pantoprazole (PROTONIX) 40 MG tablet Take 1 tablet (40 mg total) by mouth daily at 12 noon.  30 tablet  0   No current facility-administered medications for this visit.    History   Social History  . Marital Status: Married    Spouse Name: N/A    Number of Children: N/A  . Years of Education: N/A   Occupational History  . Not on file.   Social History  Main Topics  . Smoking status: Never Smoker   . Smokeless tobacco: Not on file  . Alcohol Use: No  . Drug Use: No  . Sexually Active: Yes   Other Topics Concern  . Not on file   Social History Narrative  . No narrative on file    No family history on file.  Past Medical History  Diagnosis Date  . Hypertension   . CAD (coronary artery disease)     PCI  to the LAD 2006, complex bifurcation lesion, use Plavix indefinitely  . Dyslipidemia   . Overweight   . GERD (gastroesophageal reflux disease)   . Ejection fraction     EF 55-60%, echo,  March, 2013,, no focal wall motion abnormalities    History reviewed. No pertinent past surgical history.  Patient Active Problem List  Diagnosis  . Hypertension  . CAD (coronary artery disease)  . Dyslipidemia  . GERD (gastroesophageal reflux disease)  . Overweight  . DOE (dyspnea on exertion)  . Anemia  . Chronic renal disease, stage II  . Iron deficiency anemia due to chronic blood loss  . Ejection fraction    ROS   Patient denies fever, chills, headache, sweats,  rash, change in vision, change in hearing, chest pain, cough, nausea vomiting, urinary symptoms. All other systems are reviewed and are negative.  PHYSICAL EXAM   Patient is oriented to person time and place. Affect is normal. There is no jugulovenous distention. Lungs are clear. Respiratory effort is nonlabored. Cardiac exam reveals S1 and S2. There no clicks or significant murmurs. The abdomen is soft. He is overweight. There is no peripheral edema. There no musculoskeletal deformities. There are no skin rashes.  Filed Vitals:   01/01/13 0947  BP: 148/98  Pulse: 85  Height: 5\' 8"  (1.727 m)  Weight: 222 lb 12.8 oz (101.061 kg)  SpO2: 95%   EKG is done today and reviewed by me. There is no change from the past. There are nonspecific ST-T wave changes.  ASSESSMENT & PLAN

## 2013-01-01 NOTE — Assessment & Plan Note (Signed)
The patient had an echo in March, 2013. He had normal wall motion at that time.  As part of today's evaluation I spent greater than 25 minutes with his overall assessment. More than half of this time has been spent counseling him and talking about all of his medical problems and making plans with him.

## 2013-01-01 NOTE — Patient Instructions (Addendum)
Your physician recommends that you return for lab work in: today (bmet, cbc, lipid)  Your physician has requested that you have en exercise stress myoview. For further information please visit [email or web address] Please follow instruction sheet, as given.  Your physician wants you to follow-up in: 1 year.    You will receive a reminder letter in the mail two months in advance. If you don't receive a letter, please call our office to schedule the follow-up appointment.  Your physician recommends that you continue on your current medications as directed. Please refer to the Current Medication list given to you today.

## 2013-01-01 NOTE — Assessment & Plan Note (Signed)
He has been inactive this winter and has gained weight.

## 2013-01-01 NOTE — Assessment & Plan Note (Signed)
He came to the office today n.p.o. We will check his lipids.

## 2013-01-01 NOTE — Assessment & Plan Note (Signed)
Systolic pressure slightly elevated today. I did not change his meds. He is scheduled to see his primary physician over the next month. Followup blood pressure checks will be helpful

## 2013-01-01 NOTE — Assessment & Plan Note (Signed)
As part of today's labs we will check his hemoglobin again.

## 2013-01-04 DIAGNOSIS — L570 Actinic keratosis: Secondary | ICD-10-CM | POA: Diagnosis not present

## 2013-01-04 DIAGNOSIS — D485 Neoplasm of uncertain behavior of skin: Secondary | ICD-10-CM | POA: Diagnosis not present

## 2013-01-04 DIAGNOSIS — Z85828 Personal history of other malignant neoplasm of skin: Secondary | ICD-10-CM | POA: Diagnosis not present

## 2013-01-04 DIAGNOSIS — C44529 Squamous cell carcinoma of skin of other part of trunk: Secondary | ICD-10-CM | POA: Diagnosis not present

## 2013-01-06 ENCOUNTER — Ambulatory Visit (HOSPITAL_COMMUNITY): Payer: BC Managed Care – PPO | Attending: Cardiology | Admitting: Radiology

## 2013-01-06 VITALS — BP 156/87 | Ht 68.0 in | Wt 220.0 lb

## 2013-01-06 DIAGNOSIS — I10 Essential (primary) hypertension: Secondary | ICD-10-CM | POA: Insufficient documentation

## 2013-01-06 DIAGNOSIS — I471 Supraventricular tachycardia, unspecified: Secondary | ICD-10-CM | POA: Insufficient documentation

## 2013-01-06 DIAGNOSIS — I2510 Atherosclerotic heart disease of native coronary artery without angina pectoris: Secondary | ICD-10-CM

## 2013-01-06 DIAGNOSIS — R002 Palpitations: Secondary | ICD-10-CM | POA: Insufficient documentation

## 2013-01-06 DIAGNOSIS — R0602 Shortness of breath: Secondary | ICD-10-CM | POA: Insufficient documentation

## 2013-01-06 DIAGNOSIS — E785 Hyperlipidemia, unspecified: Secondary | ICD-10-CM | POA: Insufficient documentation

## 2013-01-06 DIAGNOSIS — I4949 Other premature depolarization: Secondary | ICD-10-CM | POA: Diagnosis not present

## 2013-01-06 DIAGNOSIS — R0609 Other forms of dyspnea: Secondary | ICD-10-CM | POA: Insufficient documentation

## 2013-01-06 DIAGNOSIS — R0989 Other specified symptoms and signs involving the circulatory and respiratory systems: Secondary | ICD-10-CM | POA: Insufficient documentation

## 2013-01-06 DIAGNOSIS — R5383 Other fatigue: Secondary | ICD-10-CM | POA: Insufficient documentation

## 2013-01-06 DIAGNOSIS — R5381 Other malaise: Secondary | ICD-10-CM | POA: Insufficient documentation

## 2013-01-06 MED ORDER — TECHNETIUM TC 99M SESTAMIBI GENERIC - CARDIOLITE
10.0000 | Freq: Once | INTRAVENOUS | Status: AC | PRN
  Administered 2013-01-06: 10 via INTRAVENOUS

## 2013-01-06 MED ORDER — TECHNETIUM TC 99M SESTAMIBI GENERIC - CARDIOLITE
30.0000 | Freq: Once | INTRAVENOUS | Status: AC | PRN
  Administered 2013-01-06: 30 via INTRAVENOUS

## 2013-01-06 NOTE — Progress Notes (Signed)
[facility] SITE 3 NUCLEAR MED [address] [place], [place] [address] [phone]    Cardiology Nuclear Med Study  Peter [name] is a 67 y.o. male     MRN : [id number]     DOB: Jul 10, 1946  Procedure Date: 01/06/2013  Nuclear Med Background Indication for Stress Test:  Evaluation for Ischemia, Stent Patency and PTCA Patency History:  '06 Heart Cath: EF: 60%-PTCA/LAD-Stent-LAD, '13 ECHO: EF: 55-60% NL Cardiac Risk Factors: Hypertension and Lipids  Symptoms:  DOE, Palpitations and SOB   Nuclear Pre-Procedure Caffeine/Decaff Intake:  None NPO After: 6pm   Lungs:  clear O2 Sat: 96% on room air. IV 0.9% NS with Angio Cath:  22g  IV Site: R Hand  IV Started by:  Bonnita Levan, RN  Chest Size (in):  48 Cup Size: n/a  Height: 5\' 8"  (1.727 m)  Weight:  220 lb (99.791 kg)  BMI:  Body mass index is 33.46 kg/(m^2). Tech Comments:  Toprol held x 24 hrs. This patient's pictures checked, without any obvious abnormalities. As soon as the patient was taken off the treadmill he took his Toprol XL 100 mg po since he hadn't taken it today.    Nuclear Med Study 1 or 2 day study: 1 day  Stress Test Type:  Stress  Reading MD: Cassell Clement, MD  Order Authorizing Provider:  Willa Rough, MD  Resting Radionuclide: Technetium 72m Sestamibi  Resting Radionuclide Dose: 10.8 mCi   Stress Radionuclide:  Technetium 68m Sestamibi  Stress Radionuclide Dose: 33.0 mCi           Stress Protocol Rest HR: 67 Stress HR: 169  Rest BP: 156/87 Stress BP: 189/96  Exercise Time (min): 4:16 METS: 6.10   Predicted Max HR: 153 bpm % Max HR: 110.46 bpm Rate Pressure Product: 44010   Dose of Adenosine (mg):  n/a Dose of Lexiscan: n/a mg  Dose of Atropine (mg): n/a Dose of Dobutamine: n/a mcg/kg/min (at max HR)  Stress Test Technologist: Milana Na, EMT-P  Nuclear Technologist:  Domenic Polite, CNMT     Rest Procedure:  Myocardial perfusion imaging was performed at rest 45 minutes  following the intravenous administration of Technetium 65m Sestamibi. Rest ECG: NSR with non-specific ST-T wave changes  Stress Procedure:  The patient exercised on the treadmill utilizing the Bruce Protocol for 4:16 minutes. The patient stopped due to sob, fatigue, PSVT, and denied any chest pain.  Technetium 53m Sestamibi was injected at peak exercise and myocardial perfusion imaging was performed after a brief delay. Stress ECG: No significant change from baseline ECG  QPS Raw Data Images:  Normal; no motion artifact; normal heart/lung ratio. Stress Images:  Normal homogeneous uptake in all areas of the myocardium. Rest Images:  Normal homogeneous uptake in all areas of the myocardium. Subtraction (SDS):  No evidence of ischemia. Transient Ischemic Dilatation (Normal <1.22):  0.97 Lung/Heart Ratio (Normal <0.45):  0.26  Quantitative Gated Spect Images QGS EDV:  111 ml QGS ESV:  47 ml  Impression Exercise Capacity:  Fair exercise capacity. BP Response:  Normal blood pressure response. Clinical Symptoms:  No chest pain. ECG Impression:  No significant ST segment change suggestive of ischemia. Comparison with Prior Nuclear Study: No images to compare  Overall Impression:  Normal stress nuclear study.  LV Ejection Fraction: 57%.  LV Wall Motion:  NL LV Function; NL Wall Motion  [employer]

## 2013-01-08 ENCOUNTER — Telehealth: Payer: Self-pay | Admitting: Cardiology

## 2013-01-08 NOTE — Telephone Encounter (Signed)
Pt was in Wednesday and was told his heart would speed up and go back to normal and speed up again, said nurse would have dr Myrtis Ser review this and he hasn't heard back yet, please  Call and also wants to know if he has any restrictions  [phone]

## 2013-01-08 NOTE — Telephone Encounter (Signed)
Advised pt that per Dr. Yevonne Pax interpretation of the Stress nuclear study was normal.  Advised pt that Dr. Myrtis Ser will review and be in touch with him next week.  Pt agreed to plan.  Will forward to Debby Lefler for follow up.

## 2013-01-12 ENCOUNTER — Encounter: Payer: Self-pay | Admitting: Cardiology

## 2013-01-13 ENCOUNTER — Telehealth: Payer: Self-pay | Admitting: Cardiology

## 2013-01-13 NOTE — Telephone Encounter (Signed)
New problem    Pt had stress test on 01/06/13 and haven't heard back from anyone concerning his results. He would like to know if he need to take it easy and what is results are.

## 2013-01-13 NOTE — Telephone Encounter (Signed)
Lab results and nuclear test results were given to pt.

## 2013-01-15 ENCOUNTER — Ambulatory Visit: Payer: Medicare Other | Admitting: Cardiology

## 2013-03-01 ENCOUNTER — Encounter: Payer: Self-pay | Admitting: Cardiology

## 2013-09-13 ENCOUNTER — Other Ambulatory Visit: Payer: Self-pay | Admitting: *Deleted

## 2013-09-13 MED ORDER — ATORVASTATIN CALCIUM 40 MG PO TABS: 40.0000 mg | ORAL_TABLET | Freq: Every day | ORAL | Status: DC

## 2014-01-04 DIAGNOSIS — C44519 Basal cell carcinoma of skin of other part of trunk: Secondary | ICD-10-CM | POA: Diagnosis not present

## 2014-01-04 DIAGNOSIS — C4441 Basal cell carcinoma of skin of scalp and neck: Secondary | ICD-10-CM | POA: Diagnosis not present

## 2014-01-04 DIAGNOSIS — Z85828 Personal history of other malignant neoplasm of skin: Secondary | ICD-10-CM | POA: Diagnosis not present

## 2014-01-04 DIAGNOSIS — L570 Actinic keratosis: Secondary | ICD-10-CM | POA: Diagnosis not present

## 2014-01-04 DIAGNOSIS — L821 Other seborrheic keratosis: Secondary | ICD-10-CM | POA: Diagnosis not present

## 2014-01-04 DIAGNOSIS — D485 Neoplasm of uncertain behavior of skin: Secondary | ICD-10-CM | POA: Diagnosis not present

## 2014-01-10 ENCOUNTER — Other Ambulatory Visit: Payer: Self-pay

## 2014-01-10 MED ORDER — AMLODIPINE BESYLATE 10 MG PO TABS: 10.0000 mg | ORAL_TABLET | Freq: Every day | ORAL | Status: DC

## 2014-01-18 ENCOUNTER — Other Ambulatory Visit: Payer: Self-pay

## 2014-01-18 MED ORDER — METOPROLOL SUCCINATE ER 100 MG PO TB24: 100.0000 mg | ORAL_TABLET | Freq: Every day | ORAL | Status: DC

## 2014-01-24 ENCOUNTER — Encounter: Payer: Self-pay | Admitting: Cardiology

## 2014-01-24 ENCOUNTER — Ambulatory Visit (INDEPENDENT_AMBULATORY_CARE_PROVIDER_SITE_OTHER): Payer: BC Managed Care – PPO | Admitting: Cardiology

## 2014-01-24 VITALS — BP 130/72 | HR 69 | Ht 67.0 in | Wt 213.0 lb

## 2014-01-24 DIAGNOSIS — R0609 Other forms of dyspnea: Secondary | ICD-10-CM

## 2014-01-24 DIAGNOSIS — R0989 Other specified symptoms and signs involving the circulatory and respiratory systems: Secondary | ICD-10-CM

## 2014-01-24 DIAGNOSIS — I2510 Atherosclerotic heart disease of native coronary artery without angina pectoris: Secondary | ICD-10-CM

## 2014-01-24 DIAGNOSIS — I10 Essential (primary) hypertension: Secondary | ICD-10-CM

## 2014-01-24 NOTE — Patient Instructions (Signed)
Your physician recommends that you continue on your current medications as directed. Please refer to the Current Medication list given to you today.  Your physician wants you to follow-up in: 1 year. You will receive a reminder letter in the mail two months in advance. If you don't receive a letter, please call our office to schedule the follow-up appointment.  

## 2014-01-24 NOTE — Assessment & Plan Note (Signed)
Coronary disease is stable. There is no scar or ischemia by nuclear scan February, 2014. He has this 57%. No further workup.

## 2014-01-24 NOTE — Assessment & Plan Note (Signed)
Blood pressure is controlled. No change in therapy. 

## 2014-01-24 NOTE — Assessment & Plan Note (Signed)
He's not having any significant shortness of breath. No further workup. I'll see him back in one year. We will lower his aspirin dose to 81 mg daily.

## 2014-01-24 NOTE — Progress Notes (Signed)
Patient ID: Peter [name], male   DOB: [birth year], 68 y.o.   MRN: [id number]    HPI  Patient is seen today for followup coronary disease. I saw him last February, 2014. After that time he had a nuclear study that showed no scar or ischemia. He has not had any particular problems. He is fully active.  No Known Allergies  Current Outpatient Prescriptions  Medication Sig Dispense Refill  . amLODipine (NORVASC) 10 MG tablet Take 1 tablet (10 mg total) by mouth daily.  90 tablet  3  . aspirin 325 MG tablet Take 325 mg by mouth daily.        Marland Kitchen atorvastatin (LIPITOR) 40 MG tablet Take 1 tablet (40 mg total) by mouth daily.  90 tablet  1  . clopidogrel (PLAVIX) 75 MG tablet Take 1 tablet (75 mg total) by mouth daily.  90 tablet  3  . fenofibrate (TRICOR) 145 MG tablet Take 145 mg by mouth daily.       . ferrous sulfate 325 (65 FE) MG tablet Take 325 mg by mouth 2 (two) times daily.      Marland Kitchen lisinopril-hydrochlorothiazide (PRINZIDE,ZESTORETIC) 20-12.5 MG per tablet Take 2 tablets by mouth daily.      . metoprolol succinate (TOPROL-XL) 100 MG 24 hr tablet Take 1 tablet (100 mg total) by mouth daily. Take with or immediately following a meal.  90 tablet  0  . nitroGLYCERIN (NITROSTAT) 0.4 MG SL tablet Place 1 tablet (0.4 mg total) under the tongue every 5 (five) minutes as needed for chest pain.  25 tablet  11   No current facility-administered medications for this visit.    History   Social History  . Marital Status: Married    Spouse Name: N/A    Number of Children: N/A  . Years of Education: N/A   Occupational History  . Not on file.   Social History Main Topics  . Smoking status: Never Smoker   . Smokeless tobacco: Not on file  . Alcohol Use: No  . Drug Use: No  . Sexual Activity: Yes   Other Topics Concern  . Not on file   Social History Narrative  . No narrative on file    No family history on file.  Past Medical History  Diagnosis Date  . Hypertension   . CAD  (coronary artery disease)     PCI  to the LAD 2006, complex bifurcation lesion, use Plavix indefinitely  . Dyslipidemia   . Overweight   . GERD (gastroesophageal reflux disease)   . Ejection fraction     EF 55-60%, echo,  March, 2013,, no focal wall motion abnormalities    History reviewed. No pertinent past surgical history.  Patient Active Problem List   Diagnosis Date Noted  . Ejection fraction   . Iron deficiency anemia due to chronic blood loss 02/04/2012  . Anemia 02/03/2012  . 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 DOE (dyspnea on exertion) 01/28/2012  . Hypertension   . CAD (coronary artery disease)   . Dyslipidemia   . GERD (gastroesophageal reflux disease)   . Overweight     ROS   Patient denies fever, chills, headache, sweats, rash, change in vision, change in hearing, chest pain, cough, nausea vomiting, urinary symptoms. All other systems are reviewed and are negative.  PHYSICAL EXAM  Patient is overweight. He is oriented to person time and place. Affect is normal. There is no jugulovenous distention. Lungs are clear. Respiratory effort is  nonlabored. Cardiac exam reveals S1 and S2. There no clicks or significant murmurs. The abdomen is soft. There is no peripheral edema. There are no musculoskeletal deformities. There are no skin rashes.  Filed Vitals:   01/24/14 1518  BP: 130/72  Pulse: 69  Height: 5\' 7"  (1.702 m)  Weight: 213 lb (96.616 kg)   EKG is done today and reviewed by me. There are Q waves in leads 1 and aVL. These are old. There is no change from the past.  ASSESSMENT & PLAN

## 2014-02-15 ENCOUNTER — Encounter: Payer: Self-pay | Admitting: Cardiology

## 2014-03-01 DIAGNOSIS — K219 Gastro-esophageal reflux disease without esophagitis: Secondary | ICD-10-CM | POA: Diagnosis not present

## 2014-03-01 DIAGNOSIS — E785 Hyperlipidemia, unspecified: Secondary | ICD-10-CM | POA: Diagnosis not present

## 2014-03-01 DIAGNOSIS — IMO0002 Reserved for concepts with insufficient information to code with codable children: Secondary | ICD-10-CM | POA: Diagnosis not present

## 2014-03-01 DIAGNOSIS — Z0000 Encounter for general adult medical examination without abnormal findings: Secondary | ICD-10-CM | POA: Diagnosis not present

## 2014-03-01 DIAGNOSIS — Z23 Encounter for immunization: Secondary | ICD-10-CM | POA: Diagnosis not present

## 2014-03-01 DIAGNOSIS — I2510 Atherosclerotic heart disease of native coronary artery without angina pectoris: Secondary | ICD-10-CM | POA: Diagnosis not present

## 2014-03-01 DIAGNOSIS — I10 Essential (primary) hypertension: Secondary | ICD-10-CM | POA: Diagnosis not present

## 2014-03-01 DIAGNOSIS — Z79899 Other long term (current) drug therapy: Secondary | ICD-10-CM | POA: Diagnosis not present

## 2014-03-01 DIAGNOSIS — N183 Chronic kidney disease, stage 3 unspecified: Secondary | ICD-10-CM | POA: Diagnosis not present

## 2014-03-16 ENCOUNTER — Other Ambulatory Visit: Payer: Self-pay | Admitting: *Deleted

## 2014-03-16 MED ORDER — ATORVASTATIN CALCIUM 40 MG PO TABS: 40.0000 mg | ORAL_TABLET | Freq: Every day | ORAL | Status: DC

## 2014-03-25 ENCOUNTER — Other Ambulatory Visit: Payer: Self-pay

## 2014-03-25 MED ORDER — CLOPIDOGREL BISULFATE 75 MG PO TABS: 75.0000 mg | ORAL_TABLET | Freq: Every day | ORAL | Status: DC

## 2014-04-20 ENCOUNTER — Telehealth: Payer: Self-pay

## 2014-04-20 MED ORDER — METOPROLOL SUCCINATE ER 100 MG PO TB24: 100.0000 mg | ORAL_TABLET | Freq: Every day | ORAL | Status: DC

## 2014-04-20 NOTE — Telephone Encounter (Signed)
Refill

## 2014-10-25 ENCOUNTER — Telehealth: Payer: Self-pay | Admitting: Cardiology

## 2014-10-25 NOTE — Telephone Encounter (Signed)
New Message         Pt calling stating that his pharmacy has faxed 2 requests on 2 different days for pt's refill of Metoprolol. Please call pt back and advise.

## 2014-10-26 ENCOUNTER — Other Ambulatory Visit: Payer: Self-pay

## 2014-10-26 MED ORDER — METOPROLOL SUCCINATE ER 100 MG PO TB24: 100.0000 mg | ORAL_TABLET | Freq: Every day | ORAL | Status: DC

## 2014-12-16 ENCOUNTER — Other Ambulatory Visit: Payer: Self-pay

## 2014-12-16 MED ORDER — ATORVASTATIN CALCIUM 40 MG PO TABS: 40.0000 mg | ORAL_TABLET | Freq: Every day | ORAL | Status: DC

## 2015-01-04 ENCOUNTER — Other Ambulatory Visit: Payer: Self-pay | Admitting: *Deleted

## 2015-01-04 MED ORDER — AMLODIPINE BESYLATE 10 MG PO TABS: 10.0000 mg | ORAL_TABLET | Freq: Every day | ORAL | Status: DC

## 2015-01-27 ENCOUNTER — Ambulatory Visit (INDEPENDENT_AMBULATORY_CARE_PROVIDER_SITE_OTHER): Payer: Medicare Other | Admitting: Cardiology

## 2015-01-27 ENCOUNTER — Encounter: Payer: Self-pay | Admitting: Cardiology

## 2015-01-27 VITALS — BP 142/84 | HR 75 | Ht 67.0 in | Wt 214.8 lb

## 2015-01-27 DIAGNOSIS — I2510 Atherosclerotic heart disease of native coronary artery without angina pectoris: Secondary | ICD-10-CM

## 2015-01-27 DIAGNOSIS — I10 Essential (primary) hypertension: Secondary | ICD-10-CM

## 2015-01-27 MED ORDER — FENOFIBRATE 145 MG PO TABS: 145.0000 mg | ORAL_TABLET | Freq: Every day | ORAL | Status: DC

## 2015-01-27 MED ORDER — LISINOPRIL-HYDROCHLOROTHIAZIDE 20-12.5 MG PO TABS: 2.0000 | ORAL_TABLET | Freq: Every day | ORAL | Status: DC

## 2015-01-27 MED ORDER — CLOPIDOGREL BISULFATE 75 MG PO TABS: 75.0000 mg | ORAL_TABLET | Freq: Every day | ORAL | Status: DC

## 2015-01-27 MED ORDER — NITROGLYCERIN 0.4 MG SL SUBL: 0.4000 mg | SUBLINGUAL_TABLET | SUBLINGUAL | Status: DC | PRN

## 2015-01-27 MED ORDER — METOPROLOL SUCCINATE ER 100 MG PO TB24: 100.0000 mg | ORAL_TABLET | Freq: Every day | ORAL | Status: DC

## 2015-01-27 MED ORDER — ATORVASTATIN CALCIUM 40 MG PO TABS: 40.0000 mg | ORAL_TABLET | Freq: Every day | ORAL | Status: DC

## 2015-01-27 MED ORDER — AMLODIPINE BESYLATE 10 MG PO TABS: 10.0000 mg | ORAL_TABLET | Freq: Every day | ORAL | Status: DC

## 2015-01-27 NOTE — Patient Instructions (Signed)
Your physician recommends that you continue on your current medications as directed. Please refer to the Current Medication list given to you today.  Your physician wants you to follow-up in: 1 year. You will receive a reminder letter in the mail two months in advance. If you don't receive a letter, please call our office to schedule the follow-up appointment.  

## 2015-01-27 NOTE — Assessment & Plan Note (Signed)
Coronary disease is stable. His nuclear scan in 2014 showed no ischemia. LV function was normal. He had a complex PCI in 2006. He is on guideline directed therapy. No further workup.

## 2015-01-27 NOTE — Assessment & Plan Note (Signed)
Blood pressures controlled. No change in therapy. 

## 2015-01-27 NOTE — Progress Notes (Signed)
Cardiology Office Note   Date:  01/27/2015   ID:  Peter [name], DOB Jul 05, 1946, MRN [id number]  PCP:  [name], MD  Cardiologist:  Dola Argyle, MD   Chief Complaint  Patient presents with  . Appointment    Follow-up coronary artery disease      History of Present Illness: Peter [name] is a 69 y.o. male who presents today to follow up coronary disease. He is stable. Unfortunately his wife was let go from her job this week. This affects their ability to finish house payments. He's not having any chest pain or shortness of breath.    Past Medical History  Diagnosis Date  . Hypertension   . CAD (coronary artery disease)     PCI  to the LAD 2006, complex bifurcation lesion, use Plavix indefinitely  . Dyslipidemia   . Overweight(278.02)   . GERD (gastroesophageal reflux disease)   . Ejection fraction     EF 55-60%, echo,  March, 2013,, no focal wall motion abnormalities    History reviewed. No pertinent past surgical history.  Patient Active Problem List   Diagnosis Date Noted  . Ejection fraction   . Iron deficiency anemia due to chronic blood loss 02/04/2012  . Anemia 02/03/2012  . 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 DOE (dyspnea on exertion) 01/28/2012  . Hypertension   . CAD (coronary artery disease)   . Dyslipidemia   . GERD (gastroesophageal reflux disease)   . Overweight       Current Outpatient Prescriptions  Medication Sig Dispense Refill  . amLODipine (NORVASC) 10 MG tablet Take 1 tablet (10 mg total) by mouth daily. 90 tablet 1  . aspirin 325 MG tablet Take 325 mg by mouth daily.      Marland Kitchen atorvastatin (LIPITOR) 40 MG tablet Take 1 tablet (40 mg total) by mouth daily. 90 tablet 2  . clopidogrel (PLAVIX) 75 MG tablet Take 1 tablet (75 mg total) by mouth daily. 90 tablet 3  . fenofibrate (TRICOR) 145 MG tablet Take 145 mg by mouth daily.     . ferrous sulfate 325 (65 FE) MG tablet Take 325 mg by mouth 2 (two) times daily.    Marland Kitchen  lisinopril-hydrochlorothiazide (PRINZIDE,ZESTORETIC) 20-12.5 MG per tablet Take 2 tablets by mouth daily.    . metoprolol succinate (TOPROL-XL) 100 MG 24 hr tablet Take 1 tablet (100 mg total) by mouth daily. Take with or immediately following a meal. 90 tablet 3  . nitroGLYCERIN (NITROSTAT) 0.4 MG SL tablet Place 1 tablet (0.4 mg total) under the tongue every 5 (five) minutes as needed for chest pain. 25 tablet 11  . pantoprazole (PROTONIX) 20 MG tablet Take 20 mg by mouth as needed for heartburn.      No current facility-administered medications for this visit.    Allergies:   Review of patient's allergies indicates no known allergies.    Social History:  The patient  reports that he has never smoked. He does not have any smokeless tobacco history on file. He reports that he does not drink alcohol or use illicit drugs.   Family History:  The patient's family history includes Heart attack (age of onset: 40) in his father.    ROS:  Please see the history of present illness.     Patient denies fever, chills, headache, sweats, rash, change in vision, change in hearing, chest pain, cough, nausea or vomiting, urinary symptoms. All other systems are reviewed and are negative.  PHYSICAL EXAM: VS:  BP 142/84 mmHg  Pulse 75  Ht 5\' 7"  (1.702 m)  Wt 214 lb 12.8 oz (97.433 kg)  BMI 33.63 kg/m2 , Patient is oriented to person time and place. Affect is normal. He is overweight. Head is atraumatic. Sclera and conjunctiva are normal. There is no jugular venous distention. Lungs are clear. Respiratory effort is nonlabored. Cardiac exam reveals an S1 and S2. The abdomen is soft. There is no peripheral edema. There are no musculoskeletal deformities. There are no skin rashes.  EKG:   EKG is done today and reviewed by me. 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 There is no change from the past.   Recent Labs: No results found for requested labs within last 365 days.    Lipid Panel    Component  Value Date/Time   CHOL 140 01/01/2013 1037   TRIG 230.0* 01/01/2013 1037   HDL 38.20* 01/01/2013 1037   CHOLHDL 4 01/01/2013 1037   VLDL 46.0* 01/01/2013 1037   LDLDIRECT 68.6 01/01/2013 1037      Wt Readings from Last 3 Encounters:  01/27/15 214 lb 12.8 oz (97.433 kg)  01/24/14 213 lb (96.616 kg)  01/06/13 220 lb (99.791 kg)      Current medicines are reviewed  The patient understands his medications.     ASSESSMENT AND PLAN:

## 2015-03-08 DIAGNOSIS — I10 Essential (primary) hypertension: Secondary | ICD-10-CM | POA: Diagnosis not present

## 2015-03-08 DIAGNOSIS — E785 Hyperlipidemia, unspecified: Secondary | ICD-10-CM | POA: Diagnosis not present

## 2015-03-08 DIAGNOSIS — Z0000 Encounter for general adult medical examination without abnormal findings: Secondary | ICD-10-CM | POA: Diagnosis not present

## 2015-03-08 DIAGNOSIS — K219 Gastro-esophageal reflux disease without esophagitis: Secondary | ICD-10-CM | POA: Diagnosis not present

## 2015-03-08 DIAGNOSIS — Z79899 Other long term (current) drug therapy: Secondary | ICD-10-CM | POA: Diagnosis not present

## 2015-03-08 DIAGNOSIS — N183 Chronic kidney disease, stage 3 (moderate): Secondary | ICD-10-CM | POA: Diagnosis not present

## 2015-03-08 DIAGNOSIS — M25551 Pain in right hip: Secondary | ICD-10-CM | POA: Diagnosis not present

## 2015-03-08 DIAGNOSIS — K649 Unspecified hemorrhoids: Secondary | ICD-10-CM | POA: Diagnosis not present

## 2015-03-08 DIAGNOSIS — I2510 Atherosclerotic heart disease of native coronary artery without angina pectoris: Secondary | ICD-10-CM | POA: Diagnosis not present

## 2015-03-29 DIAGNOSIS — D485 Neoplasm of uncertain behavior of skin: Secondary | ICD-10-CM | POA: Diagnosis not present

## 2015-03-29 DIAGNOSIS — Z85828 Personal history of other malignant neoplasm of skin: Secondary | ICD-10-CM | POA: Diagnosis not present

## 2015-03-29 DIAGNOSIS — L814 Other melanin hyperpigmentation: Secondary | ICD-10-CM | POA: Diagnosis not present

## 2015-03-29 DIAGNOSIS — L821 Other seborrheic keratosis: Secondary | ICD-10-CM | POA: Diagnosis not present

## 2015-03-29 DIAGNOSIS — L570 Actinic keratosis: Secondary | ICD-10-CM | POA: Diagnosis not present

## 2015-03-29 DIAGNOSIS — C44619 Basal cell carcinoma of skin of left upper limb, including shoulder: Secondary | ICD-10-CM | POA: Diagnosis not present

## 2015-06-12 DIAGNOSIS — Z125 Encounter for screening for malignant neoplasm of prostate: Secondary | ICD-10-CM | POA: Diagnosis not present

## 2015-06-12 DIAGNOSIS — N132 Hydronephrosis with renal and ureteral calculous obstruction: Secondary | ICD-10-CM | POA: Diagnosis not present

## 2015-06-12 DIAGNOSIS — R109 Unspecified abdominal pain: Secondary | ICD-10-CM | POA: Diagnosis not present

## 2015-06-12 DIAGNOSIS — K8020 Calculus of gallbladder without cholecystitis without obstruction: Secondary | ICD-10-CM | POA: Diagnosis not present

## 2015-06-12 DIAGNOSIS — K760 Fatty (change of) liver, not elsewhere classified: Secondary | ICD-10-CM | POA: Diagnosis not present

## 2015-06-12 DIAGNOSIS — N200 Calculus of kidney: Secondary | ICD-10-CM | POA: Diagnosis not present

## 2015-06-12 DIAGNOSIS — K449 Diaphragmatic hernia without obstruction or gangrene: Secondary | ICD-10-CM | POA: Diagnosis not present

## 2015-07-11 DIAGNOSIS — N200 Calculus of kidney: Secondary | ICD-10-CM | POA: Diagnosis not present

## 2015-07-11 DIAGNOSIS — R109 Unspecified abdominal pain: Secondary | ICD-10-CM | POA: Diagnosis not present

## 2015-07-12 ENCOUNTER — Encounter (HOSPITAL_BASED_OUTPATIENT_CLINIC_OR_DEPARTMENT_OTHER): Payer: Self-pay | Admitting: *Deleted

## 2015-07-12 ENCOUNTER — Other Ambulatory Visit: Payer: Self-pay | Admitting: Urology

## 2015-07-12 NOTE — Progress Notes (Signed)
   07/12/15 1507  OBSTRUCTIVE SLEEP APNEA  Have you ever been diagnosed with sleep apnea through a sleep study? No  Do you snore loudly (loud enough to be heard through closed doors)?  1  Do you often feel tired, fatigued, or sleepy during the daytime? 0  Has anyone observed you stop breathing during your sleep? 0  Do you have, or are you being treated for high blood pressure? 1  BMI more than 35 kg/m2? 0  Age over 70 years old? 1  Neck circumference greater than 40 cm/16 inches? 1  Gender: 1  Obstructive Sleep Apnea Score 5

## 2015-07-12 NOTE — Progress Notes (Signed)
NPO AFTER MN.  ARRIVE AT 1030.  NEEDS ISTAT 8.  CURRENT EKG IN CHART AND EPIC.  WILL TAKE TOPROL, LIPITOR, TRICOR, AND NORVASC AM DOS W/ SIPS OF WATER.

## 2015-07-14 ENCOUNTER — Ambulatory Visit (HOSPITAL_BASED_OUTPATIENT_CLINIC_OR_DEPARTMENT_OTHER): Payer: Medicare Other | Admitting: Anesthesiology

## 2015-07-14 ENCOUNTER — Encounter (HOSPITAL_BASED_OUTPATIENT_CLINIC_OR_DEPARTMENT_OTHER): Payer: Self-pay | Admitting: *Deleted

## 2015-07-14 ENCOUNTER — Ambulatory Visit (HOSPITAL_BASED_OUTPATIENT_CLINIC_OR_DEPARTMENT_OTHER)
Admission: RE | Admit: 2015-07-14 | Discharge: 2015-07-14 | Disposition: A | Discharge status: Home / Self Care | Payer: Medicare Other | Source: Ambulatory Visit | Attending: Urology | Admitting: Urology

## 2015-07-14 ENCOUNTER — Encounter (HOSPITAL_BASED_OUTPATIENT_CLINIC_OR_DEPARTMENT_OTHER)
Admission: RE | Disposition: A | Discharge status: Home / Self Care | Payer: Self-pay | Source: Ambulatory Visit | Attending: Urology

## 2015-07-14 DIAGNOSIS — Z955 Presence of coronary angioplasty implant and graft: Secondary | ICD-10-CM | POA: Diagnosis not present

## 2015-07-14 DIAGNOSIS — K219 Gastro-esophageal reflux disease without esophagitis: Secondary | ICD-10-CM | POA: Diagnosis not present

## 2015-07-14 DIAGNOSIS — I2510 Atherosclerotic heart disease of native coronary artery without angina pectoris: Secondary | ICD-10-CM | POA: Diagnosis not present

## 2015-07-14 DIAGNOSIS — Z6833 Body mass index (BMI) 33.0-33.9, adult: Secondary | ICD-10-CM | POA: Diagnosis not present

## 2015-07-14 DIAGNOSIS — N200 Calculus of kidney: Secondary | ICD-10-CM | POA: Diagnosis not present

## 2015-07-14 DIAGNOSIS — N132 Hydronephrosis with renal and ureteral calculous obstruction: Secondary | ICD-10-CM | POA: Diagnosis not present

## 2015-07-14 DIAGNOSIS — I252 Old myocardial infarction: Secondary | ICD-10-CM | POA: Diagnosis not present

## 2015-07-14 DIAGNOSIS — I10 Essential (primary) hypertension: Secondary | ICD-10-CM | POA: Insufficient documentation

## 2015-07-14 DIAGNOSIS — E781 Pure hyperglyceridemia: Secondary | ICD-10-CM | POA: Diagnosis not present

## 2015-07-14 DIAGNOSIS — Z87442 Personal history of urinary calculi: Secondary | ICD-10-CM | POA: Diagnosis not present

## 2015-07-14 DIAGNOSIS — N201 Calculus of ureter: Secondary | ICD-10-CM | POA: Diagnosis not present

## 2015-07-14 HISTORY — DX: Personal history of urinary calculi: Z87.442

## 2015-07-14 HISTORY — DX: Old myocardial infarction: I25.2

## 2015-07-14 HISTORY — DX: Presence of coronary angioplasty implant and graft: Z95.5

## 2015-07-14 HISTORY — DX: Calculus of ureter: N20.1

## 2015-07-14 HISTORY — DX: Iron deficiency anemia, unspecified: D50.9

## 2015-07-14 HISTORY — DX: Pure hyperglyceridemia: E78.1

## 2015-07-14 HISTORY — DX: Other specified personal risk factors, not elsewhere classified: Z91.89

## 2015-07-14 HISTORY — DX: Other forms of dyspnea: R06.09

## 2015-07-14 HISTORY — DX: Personal history of other diseases of the digestive system: Z87.19

## 2015-07-14 HISTORY — PX: STONE EXTRACTION WITH BASKET: SHX5318

## 2015-07-14 HISTORY — DX: Dyspnea, unspecified: R06.00

## 2015-07-14 HISTORY — PX: CYSTOSCOPY WITH RETROGRADE PYELOGRAM, URETEROSCOPY AND STENT PLACEMENT: SHX5789

## 2015-07-14 LAB — POCT I-STAT, CHEM 8
BUN: 31 mg/dL — AB (ref 6–20)
CHLORIDE: 104 mmol/L (ref 101–111)
CREATININE: 1.3 mg/dL — AB (ref 0.61–1.24)
Calcium, Ion: 1.24 mmol/L (ref 1.13–1.30)
GLUCOSE: 94 mg/dL (ref 65–99)
HEMATOCRIT: 47 % (ref 39.0–52.0)
HEMOGLOBIN: 16 g/dL (ref 13.0–17.0)
POTASSIUM: 3.7 mmol/L (ref 3.5–5.1)
Sodium: 142 mmol/L (ref 135–145)
TCO2: 27 mmol/L (ref 0–100)

## 2015-07-14 SURGERY — CYSTOURETEROSCOPY, WITH RETROGRADE PYELOGRAM AND STENT INSERTION
Anesthesia: General | Laterality: Right

## 2015-07-14 MED ORDER — FENTANYL CITRATE (PF) 100 MCG/2ML IJ SOLN
INTRAMUSCULAR | Status: AC
  Filled 2015-07-14: qty 2

## 2015-07-14 MED ORDER — IOHEXOL 350 MG/ML SOLN
INTRAVENOUS | Status: DC | PRN
  Administered 2015-07-14: 13 mL via URETHRAL

## 2015-07-14 MED ORDER — OXYCODONE-ACETAMINOPHEN 5-325 MG PO TABS: 1.0000 | ORAL_TABLET | Freq: Four times a day (QID) | ORAL | Status: DC | PRN

## 2015-07-14 MED ORDER — LIDOCAINE HCL (CARDIAC) 20 MG/ML IV SOLN
INTRAVENOUS | Status: DC | PRN
Start: 2015-07-14 — End: 2015-07-14
  Administered 2015-07-14: 25 mg via INTRAVENOUS

## 2015-07-14 MED ORDER — DEXTROSE 5 % IV SOLN
380.0000 mg | INTRAVENOUS | Status: AC
Start: 2015-07-14 — End: 2015-07-14
  Administered 2015-07-14: 380 mg via INTRAVENOUS
  Filled 2015-07-14: qty 9.5

## 2015-07-14 MED ORDER — PHENAZOPYRIDINE HCL 200 MG PO TABS
200.0000 mg | ORAL_TABLET | Freq: Three times a day (TID) | ORAL | Status: DC
  Administered 2015-07-14: 200 mg via ORAL
  Filled 2015-07-14: qty 1

## 2015-07-14 MED ORDER — ONDANSETRON HCL 4 MG/2ML IJ SOLN
INTRAMUSCULAR | Status: DC | PRN
  Administered 2015-07-14: 4 mg via INTRAVENOUS

## 2015-07-14 MED ORDER — SENNOSIDES-DOCUSATE SODIUM 8.6-50 MG PO TABS: 1.0000 | ORAL_TABLET | Freq: Two times a day (BID) | ORAL | Status: DC

## 2015-07-14 MED ORDER — PHENAZOPYRIDINE HCL 200 MG PO TABS: 200.0000 mg | ORAL_TABLET | Freq: Three times a day (TID) | ORAL | Status: DC | PRN

## 2015-07-14 MED ORDER — OXYCODONE HCL 5 MG PO TABS
ORAL_TABLET | ORAL | Status: AC
  Filled 2015-07-14: qty 1

## 2015-07-14 MED ORDER — CEPHALEXIN 500 MG PO CAPS: 500.0000 mg | ORAL_CAPSULE | Freq: Two times a day (BID) | ORAL | Status: DC

## 2015-07-14 MED ORDER — ACETAMINOPHEN 10 MG/ML IV SOLN
INTRAVENOUS | Status: DC | PRN
  Administered 2015-07-14: 1000 mg via INTRAVENOUS

## 2015-07-14 MED ORDER — MIDAZOLAM HCL 2 MG/2ML IJ SOLN
0.5000 mg | Freq: Once | INTRAMUSCULAR | Status: DC | PRN
  Filled 2015-07-14: qty 2

## 2015-07-14 MED ORDER — FENTANYL CITRATE (PF) 100 MCG/2ML IJ SOLN
INTRAMUSCULAR | Status: AC
  Filled 2015-07-14: qty 4

## 2015-07-14 MED ORDER — LACTATED RINGERS IV SOLN
INTRAVENOUS | Status: DC
  Administered 2015-07-14: 11:00:00 via INTRAVENOUS
  Filled 2015-07-14: qty 1000

## 2015-07-14 MED ORDER — PHENAZOPYRIDINE HCL 100 MG PO TABS
ORAL_TABLET | ORAL | Status: AC
  Filled 2015-07-14: qty 2

## 2015-07-14 MED ORDER — SODIUM CHLORIDE 0.9 % IR SOLN
Status: DC | PRN
  Administered 2015-07-14: 3000 mL via INTRAVESICAL

## 2015-07-14 MED ORDER — OXYCODONE HCL 5 MG PO TABS
5.0000 mg | ORAL_TABLET | Freq: Once | ORAL | Status: AC
  Administered 2015-07-14: 5 mg via ORAL
  Filled 2015-07-14: qty 1

## 2015-07-14 MED ORDER — PROMETHAZINE HCL 25 MG/ML IJ SOLN
6.2500 mg | INTRAMUSCULAR | Status: DC | PRN
  Filled 2015-07-14: qty 1

## 2015-07-14 MED ORDER — GENTAMICIN IN SALINE 1.6-0.9 MG/ML-% IV SOLN
80.0000 mg | INTRAVENOUS | Status: DC
  Filled 2015-07-14: qty 50

## 2015-07-14 MED ORDER — MEPERIDINE HCL 25 MG/ML IJ SOLN
6.2500 mg | INTRAMUSCULAR | Status: DC | PRN
  Filled 2015-07-14: qty 1

## 2015-07-14 MED ORDER — PROPOFOL 10 MG/ML IV BOLUS
INTRAVENOUS | Status: DC | PRN
  Administered 2015-07-14: 140 mg via INTRAVENOUS

## 2015-07-14 MED ORDER — FENTANYL CITRATE (PF) 100 MCG/2ML IJ SOLN
INTRAMUSCULAR | Status: DC | PRN
  Administered 2015-07-14: 100 ug via INTRAVENOUS

## 2015-07-14 MED ORDER — FENTANYL CITRATE (PF) 100 MCG/2ML IJ SOLN
25.0000 ug | INTRAMUSCULAR | Status: DC | PRN
  Administered 2015-07-14: 25 ug via INTRAVENOUS
  Filled 2015-07-14: qty 1

## 2015-07-14 SURGICAL SUPPLY — 24 items
BAG DRAIN URO-CYSTO SKYTR STRL (DRAIN) ×3 IMPLANT
BAG DRN UROCATH (DRAIN) ×1
BASKET LASER NITINOL 1.9FR (BASKET) ×3 IMPLANT
BASKET STONE 1.7 NGAGE (UROLOGICAL SUPPLIES) IMPLANT
BSKT STON RTRVL 120 1.9FR (BASKET) ×1
CATH INTERMIT  6FR 70CM (CATHETERS) ×3 IMPLANT
CATH URET 5FR 28IN OPEN ENDED (CATHETERS) IMPLANT
CLOTH BEACON ORANGE TIMEOUT ST (SAFETY) ×3 IMPLANT
GLOVE BIO SURGEON STRL SZ 6.5 (GLOVE) ×1 IMPLANT
GLOVE BIO SURGEON STRL SZ7.5 (GLOVE) ×3 IMPLANT
GLOVE BIO SURGEONS STRL SZ 6.5 (GLOVE) ×1
GLOVE INDICATOR 6.5 STRL GRN (GLOVE) ×2 IMPLANT
GOWN STRL REUS W/ TWL LRG LVL3 (GOWN DISPOSABLE) ×2 IMPLANT
GOWN STRL REUS W/ TWL XL LVL3 (GOWN DISPOSABLE) ×1 IMPLANT
GOWN STRL REUS W/TWL LRG LVL3 (GOWN DISPOSABLE) ×3
GOWN STRL REUS W/TWL XL LVL3 (GOWN DISPOSABLE) ×3
GUIDEWIRE ANG ZIPWIRE 038X150 (WIRE) ×3 IMPLANT
GUIDEWIRE STR DUAL SENSOR (WIRE) ×3 IMPLANT
IV NS IRRIG 3000ML ARTHROMATIC (IV SOLUTION) ×4 IMPLANT
MANIFOLD NEPTUNE II (INSTRUMENTS) ×2 IMPLANT
PACK CYSTO (CUSTOM PROCEDURE TRAY) ×3 IMPLANT
STENT POLARIS 5FRX26 (STENTS) ×2 IMPLANT
SYRINGE 10CC LL (SYRINGE) ×3 IMPLANT
TUBE FEEDING 8FR 16IN STR KANG (MISCELLANEOUS) IMPLANT

## 2015-07-14 NOTE — Brief Op Note (Signed)
07/14/2015  12:51 PM  PATIENT:  Peter [name]  68 y.o. male  PRE-OPERATIVE DIAGNOSIS:  RIGHT URETERAL STONE  POST-OPERATIVE DIAGNOSIS:  * No post-op diagnosis entered *  PROCEDURE:  Procedure(s): CYSTOSCOPY WITH RETROGRADE PYELOGRAM, URETEROSCOPY AND STENT PLACEMENT (Right) HOLMIUM LASER APPLICATION (Right)  SURGEON:  Surgeon(s) and Role:    * Alexis Frock, MD - Primary  PHYSICIAN ASSISTANT:   ASSISTANTS: none   ANESTHESIA:   general  EBL:  Total I/O In: 400 [I.V.:400] Out: -   BLOOD ADMINISTERED:none  DRAINS: none   LOCAL MEDICATIONS USED:  NONE  SPECIMEN:  Source of Specimen:  Rt Ureteral Stones  DISPOSITION OF SPECIMEN:  [facility] for compositional analysis  COUNTS:  YES  TOURNIQUET:  * No tourniquets in log *  DICTATION: .Other Dictation: Dictation Number [id number]  PLAN OF CARE: Discharge to home after PACU  PATIENT DISPOSITION:  PACU - hemodynamically stable.   Delay start of Pharmacological VTE agent (>24hrs) due to surgical blood loss or risk of bleeding: yes

## 2015-07-14 NOTE — Anesthesia Preprocedure Evaluation (Addendum)
Anesthesia Evaluation  Patient identified by MRN, date of birth, ID band Patient awake    History of Anesthesia Complications Negative for: history of anesthetic complications  Airway Mallampati: II  TM Distance: >3 FB Neck ROM: Full    Dental  (+) Dental Advisory Given   Pulmonary neg pulmonary ROS,  breath sounds clear to auscultation        Cardiovascular hypertension, Pt. on medications and Pt. on home beta blockers - angina+ CAD, + Past MI (2006) and + Cardiac Stents (DES to LAD) Rhythm:Regular Rate:Normal  '13 ECHO: EF 55%, valves OK '14 Stress: normal   Neuro/Psych negative neurological ROS     GI/Hepatic Neg liver ROS, GERD-  Medicated and Controlled,  Endo/Other  Morbid obesity  Renal/GU Renal InsufficiencyRenal disease (creat 1.30)     Musculoskeletal   Abdominal (+) + obese,   Peds  Hematology negative hematology ROS (+)   Anesthesia Other Findings   Reproductive/Obstetrics                           Anesthesia Physical Anesthesia Plan  ASA: III  Anesthesia Plan: General   Post-op Pain Management:    Induction: Intravenous  Airway Management Planned: LMA  Additional Equipment:   Intra-op Plan:   Post-operative Plan:   Informed Consent: I have reviewed the patients History and Physical, chart, labs and discussed the procedure including the risks, benefits and alternatives for the proposed anesthesia with the patient or authorized representative who has indicated his/her understanding and acceptance.   Dental advisory given  Plan Discussed with: CRNA and Surgeon  Anesthesia Plan Comments: (Plan routine monitors, GA- LMA OK)        Anesthesia Quick Evaluation

## 2015-07-14 NOTE — Anesthesia Postprocedure Evaluation (Signed)
  Anesthesia Post-op Note  Patient: Peter [name]  Procedure(s) Performed: Procedure(s): CYSTOSCOPY WITH RETROGRADE PYELOGRAM, URETEROSCOPY AND STENT PLACEMENT (Right) STONE EXTRACTION WITH BASKET (Right)  Patient Location: PACU  Anesthesia Type:General  Level of Consciousness: awake, alert , oriented and patient cooperative  Airway and Oxygen Therapy: Patient Spontanous Breathing  Post-op Pain: none  Post-op Assessment: Post-op Vital signs reviewed, Patient's Cardiovascular Status Stable, Respiratory Function Stable, Patent Airway, No signs of Nausea or vomiting and Pain level controlled              Post-op Vital Signs: Reviewed and stable  Last Vitals:  Filed Vitals:   07/14/15 1300  BP: 117/58  Pulse: 63  Temp: 36.7 C  Resp: 17    Complications: No apparent anesthesia complications

## 2015-07-14 NOTE — Discharge Instructions (Signed)
1 - You may have urinary urgency (bladder spasms) and bloody urine on / off with stent in place. This is normal.  2 - Call MD or go to ER for fever >102, severe pain / nausea / vomiting not relieved by medications, or acute change in medical status.  3 - Remove tethered stent on Monday morning at home by pulling on string, the blue-white plastic tubing and discarding.  [facility] [phone] Post Ureteroscopy With or Without Stent Instructions  Definitions:  Ureter: The duct that transports urine from the kidney to the bladder. Stent:   A plastic hollow tube that is placed into the ureter, from the kidney to the                 bladder to prevent the ureter from swelling shut.  GENERAL INSTRUCTIONS:  Despite the fact that no skin incisions were used, the area around the ureter and bladder is raw and irritated. The stent is a foreign body which will further irritate the bladder wall. This irritation is manifested by increased frequency of urination, both day and night, and by an increase in the urge to urinate. In some, the urge to urinate is present almost always. Sometimes the urge is strong enough that you may not be able to stop yourself from urinating. The only real cure is to remove the stent and then give time for the bladder wall to heal which can't be done until the danger of the ureter swelling shut has passed, which varies.  You may see some blood in your urine while the stent is in place and a few days afterwards. Do not be alarmed, even if the urine was clear for a while. Get off your feet and drink lots of fluids until clearing occurs. If you start to pass clots or don't improve, call us.  DIET: You may return to your normal diet immediately. Because of the raw surface of your bladder, alcohol, spicy foods, acid type foods and drinks with caffeine may cause irritation or frequency and should be used in moderation. To keep your urine flowing freely and to avoid  constipation, drink plenty of fluids during the day ( 8-10 glasses ). Tip: Avoid cranberry juice because it is very acidic.  ACTIVITY: Your physical activity doesn't need to be restricted. However, if you are very active, you may see some blood in your urine. We suggest that you reduce your activity under these circumstances until the bleeding has stopped.  BOWELS: It is important to keep your bowels regular during the postoperative period. Straining with bowel movements can cause bleeding. A bowel movement every other day is reasonable. Use a mild laxative if needed, such as Milk of Magnesia 2-3 tablespoons, or 2 Dulcolax tablets. Call if you continue to have problems. If you have been taking narcotics for pain, before, during or after your surgery, you may be constipated. Take a laxative if necessary.   MEDICATION: You should resume your pre-surgery medications unless told not to. In addition you will often be given an antibiotic to prevent infection. These should be taken as prescribed until the bottles are finished unless you are having an unusual reaction to one of the drugs.  PROBLEMS YOU SHOULD REPORT TO [place]:  Fevers over 100.5 Fahrenheit.  Heavy bleeding, or clots ( See above notes about blood in urine ).  Inability to urinate.  Drug reactions ( hives, rash, nausea, vomiting, diarrhea ).  Severe burning or pain with urination that is not  improving.  FOLLOW-UP: You will need a follow-up appointment to monitor your progress. Call for this appointment at the number listed above. Usually the first appointment will be about three to fourteen days after your surgery.      Post Anesthesia Home Care Instructions  Activity: Get plenty of rest for the remainder of the day. A responsible adult should stay with you for 24 hours following the procedure.  For the next 24 hours, DO NOT: -Drive a car -Paediatric nurse -Drink alcoholic beverages -Take any medication unless instructed  by your physician -Make any legal decisions or sign important papers.  Meals: Start with liquid foods such as gelatin or soup. Progress to regular foods as tolerated. Avoid greasy, spicy, heavy foods. If nausea and/or vomiting occur, drink only clear liquids until the nausea and/or vomiting subsides. Call your physician if vomiting continues.  Special Instructions/Symptoms: Your throat may feel dry or sore from the anesthesia or the breathing tube placed in your throat during surgery. If this causes discomfort, gargle with warm salt water. The discomfort should disappear within 24 hours.  If you had a scopolamine patch placed behind your ear for the management of post- operative nausea and/or vomiting:  1. The medication in the patch is effective for 72 hours, after which it should be removed.  Wrap patch in a tissue and discard in the trash. Wash hands thoroughly with soap and water. 2. You may remove the patch earlier than 72 hours if you experience unpleasant side effects which may include dry mouth, dizziness or visual disturbances. 3. Avoid touching the patch. Wash your hands with soap and water after contact with the patch.

## 2015-07-14 NOTE — Transfer of Care (Signed)
Immediate Anesthesia Transfer of Care Note  Patient: Peter [name]  Procedure(s) Performed: Procedure(s): CYSTOSCOPY WITH RETROGRADE PYELOGRAM, URETEROSCOPY AND STENT PLACEMENT (Right) HOLMIUM LASER APPLICATION (Right)  Patient Location: PACU  Anesthesia Type:General  Level of Consciousness: awake, alert  and oriented  Airway & Oxygen Therapy: Patient Spontanous Breathing and Patient connected to nasal cannula oxygen  Post-op Assessment: Report given to RN  Post vital signs: Reviewed and stable  Last Vitals:  Filed Vitals:   07/14/15 1031  BP: 145/97  Pulse: 60  Temp: 36.9 C  Resp: 16    Complications: No apparent anesthesia complications

## 2015-07-14 NOTE — Anesthesia Procedure Notes (Signed)
Procedure Name: LMA Insertion Date/Time: 07/14/2015 12:29 PM Performed by: Bethena Roys T Pre-anesthesia Checklist: Patient identified, Emergency Drugs available, Suction available and Patient being monitored Patient Re-evaluated:Patient Re-evaluated prior to inductionOxygen Delivery Method: Circle System Utilized Preoxygenation: Pre-oxygenation with 100% oxygen Intubation Type: IV induction Ventilation: Mask ventilation without difficulty LMA: LMA with gastric port inserted LMA Size: 5.0 Number of attempts: 1 Placement Confirmation: positive ETCO2 Tube secured with: Tape Dental Injury: Teeth and Oropharynx as per pre-operative assessment

## 2015-07-14 NOTE — H&P (Signed)
Peter [name] is an 69 y.o. male.    Chief Complaint: Pre-Op Right ureteroscopic stone manipulation  HPI:   1 - Recurrent Nephrolithiasis -  Pre 2016 - "at least 40" colic events, mostly passed with medical therapy. URS x 1, SWL x2 06/2015 - Rt 67mm mid ureteral stone (1000HU, SSD 14cm) with mild hydro and bilat punctate stones by CT on eval flank pain. Given trial of medical therapy but no interval passage.   2 - Prostate Cancer Screening - No FHX prostae cancer 2007 - PSA 0.9 06/2015 - DRE 40gm smooth / PSA (normal this year per PCP)  3 - Metabolic Stone Disease -  Eval 2016: BMP,PTH,Urate - normal; Composition - pending; 24 Hr Urines - pending  PMH sig for CAD/Stents/Plavix (follows [name] November, has come off plavix for procedures), Ex-lap for ulcer, ventral hernia repair with mesh. His PCP is [name] MD. 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  Today " [name]" is seen to proceed with right ureteroscopic stone manipulation. NO interval fevers.   Past Medical History  Diagnosis Date  . Hypertension   . Dyslipidemia   . GERD (gastroesophageal reflux disease)   . CAD (coronary artery disease) cardiologist-  dr [name]    PCI w/ DES x3  to  LAD 2006, complex bifurcation lesion, use Plavix indefinitely  . History of MI (myocardial infarction)     11-05-2005--  periprocedural MI  (cardiac cath) per dr [name] note  . S/P drug eluting coronary stent placement     x3  to LAD  2006  . Dyspnea on exertion   . History of GI bleed   . Right ureteral stone   . History of kidney stones   . Hypertriglyceridemia   . Iron deficiency anemia   . At risk for sleep apnea     STOP-BANG= 5       SENT TO PCP 07-12-2015    Past Surgical History  Procedure Laterality Date  . Left ureteroscopic stone extraction  04-10-2006  . Coronary angioplasty with stent placement  11-05-2005   dr [name]    PCI and DES x3 to birfurcation lesion LAD  and diagonal branch of LAD/   20% dLM,  30-40% RCA, ef 60%  . Coronary angioplasty  11-13-2005 dr [name]    Successful touch up post-dilatation of the 3 tandem overlying Cypher stents in proximal to mid LAD (based on IVUS 0% stenosis and patent diagonal branch)/  Cutting Balloon Angioplasty of Ramus branch  . Cardiovascular stress test  01-06-2013  dr [name]    normal perfusion study/  no ischemia or infarct/  normal LV function and wall motion , ef 57%  . Transthoracic echocardiogram  02-04-2012    grade I diastolic dysfunction/  ef 55-60%/  trivial MR and TR/  mild LAE  . Extracorporeal shock wave lithotripsy  yrs ago    Family History  Problem Relation Age of Onset  . Heart attack Father 16   Social History:  reports that he has never smoked. He has never used smokeless tobacco. He reports that he does not drink alcohol or use illicit drugs.  Allergies: No Known Allergies  No prescriptions prior to admission    No results found for this or any previous visit (from the past 48 hour(s)). No results found.  Review of Systems  Constitutional: Negative.   HENT: Negative.   Eyes: Negative.   Respiratory: Negative.  Cardiovascular: Negative.   Gastrointestinal: Negative.   Genitourinary: Positive for flank pain.  Musculoskeletal: Negative.   Skin: Negative.   Neurological: Negative.   Endo/Heme/Allergies: Negative.   Psychiatric/Behavioral: Negative.     Height 5\' 7"  (1.702 m), weight 96.163 kg (212 lb). Physical Exam  Constitutional: He appears well-developed.  HENT:  Head: Normocephalic.  Eyes: Pupils are equal, round, and reactive to light.  Neck: Normal range of motion.  Cardiovascular: Normal rate.   Respiratory: Effort normal.  GI: Soft.  Genitourinary:  Mild Rt CVAT  Musculoskeletal: Normal range of motion.  Neurological: He is alert.  Skin: Skin is warm.  Psychiatric: He has a normal mood and affect. His behavior is normal. Judgment and thought content normal.      Assessment/Plan  1 - Recurrent Nephrolithiasis -  We rediscussed ureteroscopic stone manipulation with basketing and laser-lithotripsy in detail.  We rediscussed risks including bleeding, infection, damage to kidney / ureter  bladder, rarely loss of kidney. We rediscussed anesthetic risks and rare but serious surgical complications including DVT, PE, MI, and mortality. We specifically readdressed that in 5-10% of cases a staged approach is required with stenting followed by re-attempt ureteroscopy if anatomy unfavorable.   The patient voiced understanding and wishes to proceed today as planned.    2 - Prostate Cancer Screening - up to date this year  3 - Metabolic Stone Disease - Partial eval complete. Composition and 24 hr urines to follow.   Peter [name] 07/14/2015, 6:52 AM

## 2015-07-17 NOTE — Op Note (Signed)
NAME:  Peter [name], Peter [name]                   ACCOUNT NO.:  MEDICAL RECORD NO.:  [id number]  LOCATION:                                 FACILITY:  PHYSICIAN:  Alexis Frock, MD          DATE OF BIRTH:  DATE OF PROCEDURE: 07/14/2015                              OPERATIVE REPORT  DIAGNOSIS:  Right ureteral and renal stones, refractory colic.  PROCEDURE: 1. Cystoscopy with right retrograde pyelogram and interpretation. 2. Right ureteroscopy with basketing of stones. 3. Insertion of right ureteral stent 5 x 26 Polaris with tether.  ESTIMATED BLOOD LOSS:  Nil.  COMPLICATIONS:  None.  SPECIMEN:  Right ureteral and renal stone fragments for compositional analysis.  FINDINGS: 1. Impacted right distal ureteral stones with mild proximal     hydroureteronephrosis. 2. Multifocal punctate renal stones less than 1/3rd mm, these were     dislodged to allow for drainage. 3. Successful placement of right ureteral stent, proximal and renal     pelvis are still in urinary bladder.  INDICATION:  Peter [name] is very pleasant and vigorous 69 year old gentleman with long history of recurrent nephrolithiasis mostly responding to medical therapy.  He has had an ongoing episode of right renal colic for several weeks.  Due to somewhat large right ureteral stone, he has been on trial medical therapy, and the stone has progressed somewhat in antegrade fashion, but has still not past that his symptoms have become more refractory.  Options were discussed for continued management including medical therapy versus surgical extirpation versus lithotripsy and he wished to proceed with right ureteroscopic stone manipulation.  Informed consent was obtained and placed in medical record.  PROCEDURE IN DETAIL:  The patient being, Peter [name] verified. Procedure being right ureteroscopic stone manipulation was confirmed. Procedure was carried out.  Time-out was performed.  Intravenous antibiotics were  administered.  General LMA anesthesia introduced. Patient placed into a low lithotomy position and sterile field was created by prepping and draping the patient's penis, perineum, and proximal thighs using iodine x3.  Next, cystourethroscopy was performed using a 23-French rigid cystoscope with 30-degree offset lens. Inspection of the anterior and posterior urethra were unremarkable. Inspection of the urinary bladder revealed no diverticula, calcifications, papular lesions.  The right ureteral orifice was cannulated with 6-French catheter and right retrograde pyelogram was obtained.  Right retrograde pyelogram demonstrated a single right ureter, single system right kidney.  There was an ovoid filling defect in distal ureter consistent with known stone.  There was mild hydroureteronephrosis above this.  A 0.038 zip wire was advanced to the level of the upper pole, set aside as a safety wire.  An 8-French feeding tube placed in urinary bladder for pressure release.  Next, semi-rigid ureteroscopy performed in the distal ureter alongside a separate Sensor working wire.  The stone in question was encountered in the distal ureter.  It was somewhat ovoid and the distal ureter was quite capacious and was filled with this amenable to simple basketing.  It was grasped on its long axis with an escape basket, removed in its entirety, set aside for compositional analysis.  Semi-rigid ureteroscopy was performed of the more  proximal ureter allowing inspection of the distal two-thirds of the right ureter. There was another ureteral stone approximately 3-4 mm, this was also amenable to basketing, grasped and brought out in its entirety in a similar fashion.  Next, a semi-rigid ureteroscope was exchanged for the 12/14, 38-cm ureteral access sheath over the Sensor working wire to level of proximal ureter over the Sensor working wire using continuous fluoroscopic vision.  Next, flexible digital ureteroscopy  was performed at the proximal ureter and systematic inspection of the right kidney including all calyces x3.  This revealed some multifocal punctate, papillary tip calcifications, these were less than the 3rd of mm, therefore could not be basketed.  They were dislodged using ureteroscope allowing them to flush to prevent further seeding for stones.  The access sheath was then removed under continuous ureteroscopic vision. No mucosal abnormalities were found.  Next, a 5 x 26 Polaris stent was placed using a radiographic guidance over the remaining safety wire. Good proximal and distal deployment were noted.  Tether was left in place fashioned at the dorsum of the penis.  Procedure was terminated. Patient tolerated procedure well.  No immediate periprocedural complications.  The patient was taken to the postanesthesia care unit in stable condition.          ______________________________ Alexis Frock, MD     TM/MEDQ  D:  07/14/2015  T:  07/14/2015  Job:  [id number]

## 2015-07-18 ENCOUNTER — Encounter (HOSPITAL_BASED_OUTPATIENT_CLINIC_OR_DEPARTMENT_OTHER): Payer: Self-pay | Admitting: Urology

## 2015-07-19 DIAGNOSIS — N200 Calculus of kidney: Secondary | ICD-10-CM | POA: Diagnosis not present

## 2015-08-01 DIAGNOSIS — N200 Calculus of kidney: Secondary | ICD-10-CM | POA: Diagnosis not present

## 2015-10-09 DIAGNOSIS — D485 Neoplasm of uncertain behavior of skin: Secondary | ICD-10-CM | POA: Diagnosis not present

## 2015-10-09 DIAGNOSIS — L570 Actinic keratosis: Secondary | ICD-10-CM | POA: Diagnosis not present

## 2015-10-09 DIAGNOSIS — Z85828 Personal history of other malignant neoplasm of skin: Secondary | ICD-10-CM | POA: Diagnosis not present

## 2015-10-09 DIAGNOSIS — C44519 Basal cell carcinoma of skin of other part of trunk: Secondary | ICD-10-CM | POA: Diagnosis not present

## 2015-10-12 ENCOUNTER — Ambulatory Visit (INDEPENDENT_AMBULATORY_CARE_PROVIDER_SITE_OTHER): Payer: Medicare Other

## 2015-10-12 ENCOUNTER — Encounter: Payer: Self-pay | Admitting: Podiatry

## 2015-10-12 ENCOUNTER — Ambulatory Visit (INDEPENDENT_AMBULATORY_CARE_PROVIDER_SITE_OTHER): Payer: Medicare Other | Admitting: Podiatry

## 2015-10-12 VITALS — BP 151/89 | HR 65 | Resp 16

## 2015-10-12 DIAGNOSIS — M722 Plantar fascial fibromatosis: Secondary | ICD-10-CM

## 2015-10-12 DIAGNOSIS — I2510 Atherosclerotic heart disease of native coronary artery without angina pectoris: Secondary | ICD-10-CM

## 2015-10-12 MED ORDER — TRIAMCINOLONE ACETONIDE 10 MG/ML IJ SUSP
10.0000 mg | Freq: Once | INTRAMUSCULAR | Status: AC
  Administered 2015-10-12: 10 mg

## 2015-10-12 NOTE — Progress Notes (Signed)
   Subjective:    Patient ID: Peter [name], male    DOB: 1946-04-04, 69 y.o.   MRN: [id number]  HPI Comments: "I have pain in my heels"  Patient presents with: Foot Pain: Plantar heel bilateral - aching for 2 weeks, AM pain, had this problem 15 years ago-had injections and orthotics made-helped for many years, now can barely walk some days, wearing Dr. Felicie Morn OTC insoles, calf is starting to feel tight    Foot Pain      Review of Systems  All other systems reviewed and are negative.      Objective:   Physical Exam        Assessment & Plan:

## 2015-10-12 NOTE — Patient Instructions (Signed)

## 2015-10-12 NOTE — Progress Notes (Signed)
Subjective:     Patient ID: Peter [name], male   DOB: [birth year], 69 y.o.   MRN: [id number]  HPI patient states I'm having a lot of pain on the bottom of both my heels and it's making it hard for me to walk and it's been going on for about 6 weeks worse over the last few weeks. I had injections about 15 years ago and orthotics which I think are wearing them out and I will bring to next visit   Review of Systems  All other systems reviewed and are negative.      Objective:   Physical Exam  Constitutional: He is oriented to person, place, and time.  Cardiovascular: Intact distal pulses.   Musculoskeletal: Normal range of motion.  Neurological: He is oriented to person, place, and time.  Skin: Skin is warm.  Nursing note and vitals reviewed.  neurovascular status intact muscle strength adequate range of motion within normal limits with patient noted to have exquisite discomfort in the plantar heel region bilateral with lesion and fluid around the medial band with moderate depression of the arch noted. Good digital perfusion is noted patient's well oriented 3     Assessment:     Inflammatory fasciitis heel region bilateral    Plan:     H&P and x-rays reviewed with patient and today I injected the plantar fascia bilateral 3 mg Kenalog 5 mg Xylocaine and applied fascial brace is. Gave instructions on physical therapy supportive shoes and reappoint to reevaluate in 1 week and we'll also reevaluate his previous orthotics

## 2015-10-20 ENCOUNTER — Encounter: Payer: Self-pay | Admitting: Podiatry

## 2015-10-20 ENCOUNTER — Ambulatory Visit (INDEPENDENT_AMBULATORY_CARE_PROVIDER_SITE_OTHER): Payer: Medicare Other | Admitting: Podiatry

## 2015-10-20 VITALS — BP 93/46 | HR 70 | Resp 16

## 2015-10-20 DIAGNOSIS — M722 Plantar fascial fibromatosis: Secondary | ICD-10-CM | POA: Diagnosis not present

## 2015-10-20 MED ORDER — TRIAMCINOLONE ACETONIDE 10 MG/ML IJ SUSP
10.0000 mg | Freq: Once | INTRAMUSCULAR | Status: AC
  Administered 2015-10-20: 10 mg

## 2015-10-22 NOTE — Progress Notes (Signed)
Subjective:     Patient ID: Peter [name], male   DOB: 02-07-1946, 69 y.o.   MRN: [id number]  HPI patient states I'm doing slightly better but still having a lot of pain in my heels especially after sitting and when I get up in the morning   Review of Systems     Objective:   Physical Exam Neurovascular status intact muscle strength adequate with range of motion adequate and noted to have exquisite discomfort still plantar aspect heels bilateral with mild diminishment of the acute pain    Assessment:     Continued plantar fasciitis bilateral with moderate depression of the arch noted    Plan:     Reviewed conditions and explained importance of shoe gear modifications and reinjected the plantar fascia bilateral 3 mg Kenalog 5 mg Xylocaine and dispensed night splint with instructions on usage. Reappoint to reevaluate in 4 weeks

## 2015-11-08 ENCOUNTER — Ambulatory Visit (INDEPENDENT_AMBULATORY_CARE_PROVIDER_SITE_OTHER): Payer: Medicare Other | Admitting: Podiatry

## 2015-11-08 ENCOUNTER — Encounter: Payer: Self-pay | Admitting: Podiatry

## 2015-11-08 VITALS — BP 135/84 | HR 80 | Resp 16

## 2015-11-08 DIAGNOSIS — I2510 Atherosclerotic heart disease of native coronary artery without angina pectoris: Secondary | ICD-10-CM | POA: Diagnosis not present

## 2015-11-08 DIAGNOSIS — M722 Plantar fascial fibromatosis: Secondary | ICD-10-CM | POA: Diagnosis not present

## 2015-11-08 MED ORDER — DICLOFENAC SODIUM 75 MG PO TBEC: 75.0000 mg | DELAYED_RELEASE_TABLET | Freq: Two times a day (BID) | ORAL | Status: DC

## 2015-11-08 NOTE — Patient Instructions (Signed)

## 2015-11-09 NOTE — Progress Notes (Signed)
Subjective:     Patient ID: Peter [name], male   DOB: 26-Jun-1946, 69 y.o.   MRN: [id number]  HPI patient states I'm still having pain in my heels with moderate improvement but still quite sore and I know my orthotics are getting old and I want them evaluated   Review of Systems     Objective:   Physical Exam Neurovascular status intact with patient having moderate discomfort in the plantar heel region bilateral with inflammation and fluid around the medial band and moderate depression of the arch noted. Presents with orthotics today from 15 years ago    Assessment:     Chronic plantar fasciitis bilateral which has improved slightly with aggressive treatment of acute inflammation but does have chronic issues    Plan:     H&P and condition reviewed with patient. Today we scanned for new orthotics and I gave instructions on physical therapy shoe gear modifications and reduced activity. He may require other treatments including shockwave or surgery if symptoms continue to persist

## 2015-11-10 ENCOUNTER — Ambulatory Visit: Payer: Medicare Other | Admitting: Podiatry

## 2015-12-01 ENCOUNTER — Ambulatory Visit: Payer: Medicare Other | Admitting: *Deleted

## 2015-12-01 DIAGNOSIS — M722 Plantar fascial fibromatosis: Secondary | ICD-10-CM

## 2015-12-01 NOTE — Progress Notes (Signed)
Patient ID: Peter [name], male   DOB: December 16, 1945, 70 y.o.   MRN: [id number] Patient presents for orthotic pick up.  Verbal and written break in and wear instructions given.  Patient will follow up in 4 weeks if symptoms worsen or fail to improve.

## 2015-12-01 NOTE — Patient Instructions (Signed)

## 2016-01-01 DIAGNOSIS — H40003 Preglaucoma, unspecified, bilateral: Secondary | ICD-10-CM | POA: Diagnosis not present

## 2016-01-22 DIAGNOSIS — H40053 Ocular hypertension, bilateral: Secondary | ICD-10-CM | POA: Diagnosis not present

## 2016-02-16 ENCOUNTER — Ambulatory Visit (INDEPENDENT_AMBULATORY_CARE_PROVIDER_SITE_OTHER): Payer: No Typology Code available for payment source | Admitting: Cardiology

## 2016-02-16 ENCOUNTER — Encounter: Payer: Self-pay | Admitting: Cardiology

## 2016-02-16 VITALS — BP 128/84 | HR 79 | Ht 67.0 in | Wt 232.4 lb

## 2016-02-16 DIAGNOSIS — E669 Obesity, unspecified: Secondary | ICD-10-CM | POA: Diagnosis not present

## 2016-02-16 DIAGNOSIS — I10 Essential (primary) hypertension: Secondary | ICD-10-CM | POA: Diagnosis not present

## 2016-02-16 DIAGNOSIS — I2510 Atherosclerotic heart disease of native coronary artery without angina pectoris: Secondary | ICD-10-CM | POA: Diagnosis not present

## 2016-02-16 MED ORDER — AMLODIPINE BESYLATE 10 MG PO TABS: 10.0000 mg | ORAL_TABLET | Freq: Every morning | ORAL | Status: DC

## 2016-02-16 MED ORDER — CLOPIDOGREL BISULFATE 75 MG PO TABS: ORAL_TABLET | ORAL | Status: DC

## 2016-02-16 MED ORDER — ATORVASTATIN CALCIUM 40 MG PO TABS: 40.0000 mg | ORAL_TABLET | Freq: Every morning | ORAL | Status: DC

## 2016-02-16 MED ORDER — NITROGLYCERIN 0.4 MG SL SUBL: 0.4000 mg | SUBLINGUAL_TABLET | SUBLINGUAL | Status: DC | PRN

## 2016-02-16 MED ORDER — METOPROLOL SUCCINATE ER 100 MG PO TB24: 100.0000 mg | ORAL_TABLET | Freq: Every morning | ORAL | Status: DC

## 2016-02-16 NOTE — Patient Instructions (Signed)

## 2016-02-16 NOTE — Progress Notes (Signed)
Cardiology Office Note    Date:  02/16/2016   ID:  Peter [name], DOB 03/28/1946, MRN [id number]  PCP:  [name], MD  Cardiologist:   Candee Furbish, MD  (Former Dr. [name])    History of Present Illness:  Peter [name] is a 70 y.o. male here for follow-up visit, former Dr. [name] patient. Has coronary artery disease with PCI to the LAD in 2006, 11/05/2005 (Dr. [name]) complex bifurcation lesion in which Plavix use indefinite was recommended. Ejection fraction is 55-60%. A nuclear stress test in 2014 that showed no ischemia, normal EF.  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   Plantar fasciitis. Injections no help. Left foot greater than right in pain. He has gained weight because of this.  No DVT left. Left edema chronic. Amlodipine.  He's not having any anginal symptoms, no chest pain, no shortness of breath. He has gained weight because of plantar fasciitis. He wears special shoes for this.     Past Medical History  Diagnosis Date  . Hypertension   . Dyslipidemia   . GERD (gastroesophageal reflux disease)   . CAD (coronary artery disease) cardiologist-  dr [name]    PCI w/ DES x3  to  LAD 2006, complex bifurcation lesion, use Plavix indefinitely  . History of MI (myocardial infarction)     11-05-2005--  periprocedural MI  (cardiac cath) per dr [name] note  . S/P drug eluting coronary stent placement     x3  to LAD  2006  . Dyspnea on exertion   . History of GI bleed   . Right ureteral stone   . History of kidney stones   . Hypertriglyceridemia   . Iron deficiency anemia   . At risk for sleep apnea     STOP-BANG= 5       SENT TO PCP 07-12-2015    Past Surgical History  Procedure Laterality Date  . Left ureteroscopic stone extraction  04-10-2006  . Coronary angioplasty with stent placement  11-05-2005   dr [name]    PCI and DES x3 to birfurcation lesion LAD and diagonal branch of LAD/   20% dLM,  30-40% RCA, ef 60%  . Coronary angioplasty  11-13-2005 dr  [name]    Successful touch up post-dilatation of the 3 tandem overlying Cypher stents in proximal to mid LAD (based on IVUS 0% stenosis and patent diagonal branch)/  Cutting Balloon Angioplasty of Ramus branch  . Cardiovascular stress test  01-06-2013  dr [name]    normal perfusion study/  no ischemia or infarct/  normal LV function and wall motion , ef 57%  . Transthoracic echocardiogram  02-04-2012    grade I diastolic dysfunction/  ef 55-60%/  trivial MR and TR/  mild LAE  . Extracorporeal shock wave lithotripsy  yrs ago  . Cystoscopy with retrograde pyelogram, ureteroscopy and stent placement Right 07/14/2015    Procedure: [place], URETEROSCOPY AND STENT PLACEMENT;  Surgeon: Alexis Frock, MD;  Location: [facility];  Service: Urology;  Laterality: Right;  . Stone extraction with basket Right 07/14/2015    Procedure: STONE EXTRACTION WITH BASKET;  Surgeon: Alexis Frock, MD;  Location: [facility];  Service: Urology;  Laterality: Right;    Current Medications: Outpatient Prescriptions Prior to Visit  Medication Sig Dispense Refill  . aspirin EC 81 MG tablet Take 81 mg by mouth daily.    . clopidogrel (PLAVIX) 75 MG tablet Take one by mouth daily  2  .  ferrous sulfate 325 (65 FE) MG tablet Take 325 mg by mouth every morning.     . nitroGLYCERIN (NITROSTAT) 0.4 MG SL tablet Place 1 tablet (0.4 mg total) under the tongue every 5 (five) minutes as needed for chest pain. 90 tablet 1  . pantoprazole (PROTONIX) 40 MG tablet Take 40 mg by mouth every evening.    Marland Kitchen amLODipine (NORVASC) 10 MG tablet Take 1 tablet (10 mg total) by mouth daily. (Patient taking differently: Take 10 mg by mouth every morning. ) 90 tablet 3  . atorvastatin (LIPITOR) 40 MG tablet Take 1 tablet (40 mg total) by mouth daily. (Patient taking differently: Take 40 mg by mouth every morning. ) 90 tablet 3  . fenofibrate (TRICOR) 145 MG tablet Take 1 tablet (145 mg total) by  mouth daily. (Patient taking differently: Take 145 mg by mouth every morning. ) 90 tablet 3  . lisinopril-hydrochlorothiazide (PRINZIDE,ZESTORETIC) 20-12.5 MG per tablet Take 2 tablets by mouth daily. (Patient taking differently: Take 2 tablets by mouth every morning. ) 180 tablet 3  . metoprolol succinate (TOPROL-XL) 100 MG 24 hr tablet Take 1 tablet (100 mg total) by mouth daily. Take with or immediately following a meal. (Patient taking differently: Take 100 mg by mouth every morning. Take with or immediately following a meal.) 90 tablet 3  . diclofenac (VOLTAREN) 75 MG EC tablet Take 1 tablet (75 mg total) by mouth 2 (two) times daily. 50 tablet 2  . fluorouracil (EFUDEX) 5 % cream APPLY TO AFFECTED AREAS BID FOR 2 WEEKS  0  . phenazopyridine (PYRIDIUM) 200 MG tablet Take 1 tablet (200 mg total) by mouth 3 (three) times daily as needed for pain. (urinary burning) 30 tablet 0  . senna-docusate (SENOKOT-S) 8.6-50 MG per tablet Take 1 tablet by mouth 2 (two) times daily. While taking pain meds to prevent constipation 30 tablet 0   No facility-administered medications prior to visit.     Allergies:   Review of patient's allergies indicates no known allergies.   Social History   Social History  . Marital Status: Married    Spouse Name: N/A  . Number of Children: N/A  . Years of Education: N/A   Social History Main Topics  . Smoking status: Never Smoker   . Smokeless tobacco: Never Used  . Alcohol Use: No  . Drug Use: No  . Sexual Activity: Not Asked   Other Topics Concern  . None   Social History Narrative     Family History:  The patient's family history includes Heart attack (age of onset: 20) in his father.   ROS:   Please see the history of present illness.   Foot pain ROS All other systems reviewed and are negative.   PHYSICAL EXAM:   VS:  BP 128/84 mmHg  Pulse 79  Ht 5\' 7"  (1.702 m)  Wt 232 lb 6.4 oz (105.416 kg)  BMI 36.39 kg/m2   GEN: Well nourished, well  developed, in no acute distress HEENT: normal Neck: no JVD, carotid bruits, or masses Cardiac: RRR; no murmurs, rubs, or gallops,no edema  Respiratory:  clear to auscultation bilaterally, normal work of breathing GI: soft, nontender, nondistended, + BS, overweight MS: no deformity or atrophy Skin: warm and dry, no rash, 1+ edema BLE L>R Neuro:  Alert and Oriented x 3, Strength and sensation are intact Psych: euthymic mood, full affect  Wt Readings from Last 3 Encounters:  02/16/16 232 lb 6.4 oz (105.416 kg)  07/14/15 211 lb (95.709  kg)  01/27/15 214 lb 12.8 oz (97.433 kg)      Studies/Labs Reviewed:   EKG:  EKG is ordered today.  02/16/16-sinus rhythm, 79, occasional PVCs, left axis deviation, nonspecific ST-T wave changes. Q waves in 1, aVL, possible old lateral infarct pattern. Personally viewed.  Recent Labs: 07/14/2015: BUN 31*; Creatinine, Ser 1.30*; Hemoglobin 16.0; Potassium 3.7; Sodium 142   Lipid Panel    Component Value Date/Time   CHOL 140 01/01/2013 1037   TRIG 230.0* 01/01/2013 1037   HDL 38.20* 01/01/2013 1037   CHOLHDL 4 01/01/2013 1037   VLDL 46.0* 01/01/2013 1037   LDLDIRECT 68.6 01/01/2013 1037    Additional studies/ records that were reviewed today include:  Prior office note, lab work, EKG, echo, nuclear stress test reviewed    ASSESSMENT:    1. Essential hypertension      PLAN:  In order of problems listed above:  CAD  - Prior LAD intervention 2006 complex bifurcation lifelong Plavix, doing well.  Hyperlipidemia -LDL 68, excellent. Continue with atorvastatin. No myalgias. -Hypertriglyceridemia-continue statin, low-fat foods.  Essential hypertension -Currently well controlled. No changes made. -Side effect of amlodipine 10 mg can be lower extremity edema.  Obesity -Continue to encourage weight loss, decrease carbohydrates. Limited by plantar fasciitis.   Medication Adjustments/Labs and Tests Ordered: Current medicines are reviewed at  length with the patient today.  Concerns regarding medicines are outlined above.  Medication changes, Labs and Tests ordered today are listed in the Patient Instructions below. There are no Patient Instructions on file for this visit.   Peter Rumpf, MD  02/16/2016 9:28 AM    [facility] [address], [place], [place]  [address] Phone: [phone]; Fax: [phone]  \

## 2016-03-08 DIAGNOSIS — H43813 Vitreous degeneration, bilateral: Secondary | ICD-10-CM | POA: Diagnosis not present

## 2016-03-08 DIAGNOSIS — H25013 Cortical age-related cataract, bilateral: Secondary | ICD-10-CM | POA: Diagnosis not present

## 2016-03-08 DIAGNOSIS — I10 Essential (primary) hypertension: Secondary | ICD-10-CM | POA: Diagnosis not present

## 2016-03-08 DIAGNOSIS — H40003 Preglaucoma, unspecified, bilateral: Secondary | ICD-10-CM | POA: Diagnosis not present

## 2016-03-19 DIAGNOSIS — I10 Essential (primary) hypertension: Secondary | ICD-10-CM | POA: Diagnosis not present

## 2016-03-19 DIAGNOSIS — N183 Chronic kidney disease, stage 3 (moderate): Secondary | ICD-10-CM | POA: Diagnosis not present

## 2016-03-19 DIAGNOSIS — K219 Gastro-esophageal reflux disease without esophagitis: Secondary | ICD-10-CM | POA: Diagnosis not present

## 2016-03-19 DIAGNOSIS — E7800 Pure hypercholesterolemia, unspecified: Secondary | ICD-10-CM | POA: Diagnosis not present

## 2016-03-19 DIAGNOSIS — I2510 Atherosclerotic heart disease of native coronary artery without angina pectoris: Secondary | ICD-10-CM | POA: Diagnosis not present

## 2016-03-19 DIAGNOSIS — Z0001 Encounter for general adult medical examination with abnormal findings: Secondary | ICD-10-CM | POA: Diagnosis not present

## 2016-03-19 DIAGNOSIS — Z79899 Other long term (current) drug therapy: Secondary | ICD-10-CM | POA: Diagnosis not present

## 2016-03-19 DIAGNOSIS — Z1389 Encounter for screening for other disorder: Secondary | ICD-10-CM | POA: Diagnosis not present

## 2016-04-02 ENCOUNTER — Other Ambulatory Visit: Payer: Self-pay | Admitting: Cardiology

## 2016-04-09 DIAGNOSIS — Z85828 Personal history of other malignant neoplasm of skin: Secondary | ICD-10-CM | POA: Diagnosis not present

## 2016-04-09 DIAGNOSIS — C4441 Basal cell carcinoma of skin of scalp and neck: Secondary | ICD-10-CM | POA: Diagnosis not present

## 2016-04-09 DIAGNOSIS — C44519 Basal cell carcinoma of skin of other part of trunk: Secondary | ICD-10-CM | POA: Diagnosis not present

## 2016-04-09 DIAGNOSIS — D485 Neoplasm of uncertain behavior of skin: Secondary | ICD-10-CM | POA: Diagnosis not present

## 2016-04-09 DIAGNOSIS — C44712 Basal cell carcinoma of skin of right lower limb, including hip: Secondary | ICD-10-CM | POA: Diagnosis not present

## 2016-04-09 DIAGNOSIS — L821 Other seborrheic keratosis: Secondary | ICD-10-CM | POA: Diagnosis not present

## 2016-04-09 DIAGNOSIS — L570 Actinic keratosis: Secondary | ICD-10-CM | POA: Diagnosis not present

## 2016-04-11 ENCOUNTER — Ambulatory Visit (INDEPENDENT_AMBULATORY_CARE_PROVIDER_SITE_OTHER): Payer: Medicare Other | Admitting: Podiatry

## 2016-04-11 ENCOUNTER — Encounter: Payer: Self-pay | Admitting: Podiatry

## 2016-04-11 ENCOUNTER — Ambulatory Visit (INDEPENDENT_AMBULATORY_CARE_PROVIDER_SITE_OTHER): Payer: Medicare Other

## 2016-04-11 VITALS — BP 155/97 | HR 16 | Resp 90

## 2016-04-11 DIAGNOSIS — M79671 Pain in right foot: Secondary | ICD-10-CM

## 2016-04-11 DIAGNOSIS — M79672 Pain in left foot: Secondary | ICD-10-CM

## 2016-04-11 DIAGNOSIS — M722 Plantar fascial fibromatosis: Secondary | ICD-10-CM | POA: Diagnosis not present

## 2016-04-11 MED ORDER — TRIAMCINOLONE ACETONIDE 10 MG/ML IJ SUSP
10.0000 mg | Freq: Once | INTRAMUSCULAR | Status: AC
  Administered 2016-04-11: 10 mg

## 2016-04-11 MED ORDER — METHYLPREDNISOLONE 4 MG PO TBPK: ORAL_TABLET | ORAL | Status: DC

## 2016-04-11 NOTE — Progress Notes (Signed)
Subjective:     Patient ID: Peter [name], male   DOB: 04/09/1946, 70 y.o.   MRN: [id number]  HPI patient states I was doing pretty well for a while and I ended up with severe pain recently in my right plantar heel with swelling and mild to moderate pain in my left which is been present since you treated me around 6 months ago and I was making some progress especially with the right but it's awful   Review of Systems     Objective:   Physical Exam Neurovascular status intact muscle strength adequate with exquisite discomfort with swelling right plantar heel at the insertional point tendon into the calcaneus with mild to moderate pain in the plantar heel left at the insertion tendon calcaneus    Assessment:     Appears to be acute plantar fasciitis right but I'm concerned about the amount of swelling so I am sending for blood work    Plan:     Injected the plantar fascial right 3 mg Kenalog 5 mill grams Xylocaine and dispensed a cam walker due to the intense discomfort. I then advised we may need to do a more aggressive treatment and discussed possibility for shockwave or open surgery that we will discussed once we have this under better control  X-ray was negative for signs of fracture did indicate large spur formation

## 2016-04-11 NOTE — Patient Instructions (Signed)

## 2016-04-15 DIAGNOSIS — M79609 Pain in unspecified limb: Secondary | ICD-10-CM | POA: Diagnosis not present

## 2016-04-15 DIAGNOSIS — M79671 Pain in right foot: Secondary | ICD-10-CM | POA: Diagnosis not present

## 2016-04-15 DIAGNOSIS — M79672 Pain in left foot: Secondary | ICD-10-CM | POA: Diagnosis not present

## 2016-04-16 LAB — ANA, IFA COMPREHENSIVE PANEL
ANA: NEGATIVE
DS DNA AB: 3 [IU]/mL
ENA SM AB SER-ACNC: NEGATIVE
SCLERODERMA (SCL-70) (ENA) ANTIBODY, IGG: NEGATIVE
SM/RNP: <1
SSA (Ro) (ENA) Antibody, IgG: <1
SSB (La) (ENA) Antibody, IgG: <1

## 2016-04-16 LAB — C-REACTIVE PROTEIN: CRP: <0.5 mg/dL (ref <0.60)

## 2016-04-16 LAB — URIC ACID: URIC ACID, SERUM: 6.2 mg/dL (ref 4.0–8.0)

## 2016-04-16 LAB — RHEUMATOID FACTOR

## 2016-04-16 LAB — SEDIMENTATION RATE: SED RATE: 1 mm/h (ref 0–20)

## 2016-04-19 ENCOUNTER — Ambulatory Visit (INDEPENDENT_AMBULATORY_CARE_PROVIDER_SITE_OTHER): Payer: Medicare Other | Admitting: Podiatry

## 2016-04-19 ENCOUNTER — Encounter: Payer: Self-pay | Admitting: Podiatry

## 2016-04-19 DIAGNOSIS — M722 Plantar fascial fibromatosis: Secondary | ICD-10-CM

## 2016-04-19 MED ORDER — TRIAMCINOLONE ACETONIDE 10 MG/ML IJ SUSP
10.0000 mg | Freq: Once | INTRAMUSCULAR | Status: AC
  Administered 2016-04-19: 10 mg

## 2016-04-19 NOTE — Progress Notes (Signed)
Subjective:     Patient ID: Peter [name], male   DOB: 03-25-1946, 70 y.o.   MRN: [id number]  HPI patient presents stating I'm still having a lot of pain in the plantar of my right heel. It started in the left neck better and then it became the right and it was doing okay for a while and now has become severely tender at the insertional point of the tendon into the calcaneus   Review of Systems Plantar fasciitis acute nature right over left with inflammation and fluid buildup noted    Objective:   Physical Exam Mentioned above in review of systems    Assessment:     Acute plantar fasciitis right heel    Plan:     Reviewed with him and his wife treatment options available. I discussed the option of continued immobilization with injection treatment versus the shockwave therapy versus open cutting surgery. I went over the pros and cons of each of these on hold off on surgical intervention for another several weeks and he will continue to immobilize. I reinjected the plantar fascial right 3 mg Kenalog 5 mill grams Xylocaine and we'll reevaluate where he is at that time

## 2016-04-25 DIAGNOSIS — H43813 Vitreous degeneration, bilateral: Secondary | ICD-10-CM | POA: Diagnosis not present

## 2016-04-25 DIAGNOSIS — H40003 Preglaucoma, unspecified, bilateral: Secondary | ICD-10-CM | POA: Diagnosis not present

## 2016-04-25 DIAGNOSIS — H25013 Cortical age-related cataract, bilateral: Secondary | ICD-10-CM | POA: Diagnosis not present

## 2016-04-25 DIAGNOSIS — I10 Essential (primary) hypertension: Secondary | ICD-10-CM | POA: Diagnosis not present

## 2016-05-03 ENCOUNTER — Encounter: Payer: Self-pay | Admitting: Podiatry

## 2016-05-03 ENCOUNTER — Ambulatory Visit (INDEPENDENT_AMBULATORY_CARE_PROVIDER_SITE_OTHER): Payer: Medicare Other | Admitting: Podiatry

## 2016-05-03 DIAGNOSIS — I2510 Atherosclerotic heart disease of native coronary artery without angina pectoris: Secondary | ICD-10-CM | POA: Diagnosis not present

## 2016-05-03 DIAGNOSIS — M722 Plantar fascial fibromatosis: Secondary | ICD-10-CM

## 2016-05-05 NOTE — Progress Notes (Signed)
Subjective:     Patient ID: Peter [name], male   DOB: [birth year], 70 y.o.   MRN: [id number]  HPI patient presents stating he was concerned because he is developed pain again in his right heel and is concerned that that could be a problem   Review of Systems     Objective:   Physical Exam Neurovascular status intact with discomfort in the medial and central band of the right heel that's localized but I did not note any acute trauma at this time    Assessment:     Fasciitis-like symptomatology right present    Plan:     Reviewed different treatment options including chances for shockwave therapy chances for continued immobilization or surgery. At this point he is getting continue boot usage ice therapy and wearing cushion his orthotics more with ultimately other procedures that may be necessary

## 2016-05-13 ENCOUNTER — Encounter: Payer: Self-pay | Admitting: Podiatry

## 2016-07-16 ENCOUNTER — Emergency Department (HOSPITAL_COMMUNITY): Payer: Medicare Other

## 2016-07-16 ENCOUNTER — Encounter (HOSPITAL_COMMUNITY): Payer: Self-pay | Admitting: Emergency Medicine

## 2016-07-16 ENCOUNTER — Inpatient Hospital Stay (HOSPITAL_COMMUNITY)
Admission: EM | Admit: 2016-07-16 | Discharge: 2016-07-17 | DRG: 308 | Disposition: A | Discharge status: Home / Self Care | Payer: Medicare Other | Attending: Internal Medicine | Admitting: Internal Medicine

## 2016-07-16 DIAGNOSIS — E663 Overweight: Secondary | ICD-10-CM | POA: Diagnosis present

## 2016-07-16 DIAGNOSIS — I5031 Acute diastolic (congestive) heart failure: Secondary | ICD-10-CM | POA: Diagnosis not present

## 2016-07-16 DIAGNOSIS — K219 Gastro-esophageal reflux disease without esophagitis: Secondary | ICD-10-CM | POA: Diagnosis present

## 2016-07-16 DIAGNOSIS — I110 Hypertensive heart disease with heart failure: Secondary | ICD-10-CM | POA: Diagnosis present

## 2016-07-16 DIAGNOSIS — I252 Old myocardial infarction: Secondary | ICD-10-CM

## 2016-07-16 DIAGNOSIS — R079 Chest pain, unspecified: Secondary | ICD-10-CM | POA: Diagnosis present

## 2016-07-16 DIAGNOSIS — Z7902 Long term (current) use of antithrombotics/antiplatelets: Secondary | ICD-10-CM | POA: Diagnosis not present

## 2016-07-16 DIAGNOSIS — Z955 Presence of coronary angioplasty implant and graft: Secondary | ICD-10-CM | POA: Diagnosis not present

## 2016-07-16 DIAGNOSIS — I5033 Acute on chronic diastolic (congestive) heart failure: Secondary | ICD-10-CM | POA: Diagnosis present

## 2016-07-16 DIAGNOSIS — I4819 Other persistent atrial fibrillation: Secondary | ICD-10-CM | POA: Diagnosis present

## 2016-07-16 DIAGNOSIS — Z8249 Family history of ischemic heart disease and other diseases of the circulatory system: Secondary | ICD-10-CM

## 2016-07-16 DIAGNOSIS — Z7982 Long term (current) use of aspirin: Secondary | ICD-10-CM | POA: Diagnosis not present

## 2016-07-16 DIAGNOSIS — Z6836 Body mass index (BMI) 36.0-36.9, adult: Secondary | ICD-10-CM | POA: Diagnosis not present

## 2016-07-16 DIAGNOSIS — E785 Hyperlipidemia, unspecified: Secondary | ICD-10-CM | POA: Diagnosis present

## 2016-07-16 DIAGNOSIS — I5021 Acute systolic (congestive) heart failure: Secondary | ICD-10-CM

## 2016-07-16 DIAGNOSIS — I2510 Atherosclerotic heart disease of native coronary artery without angina pectoris: Secondary | ICD-10-CM

## 2016-07-16 DIAGNOSIS — I4891 Unspecified atrial fibrillation: Principal | ICD-10-CM | POA: Diagnosis present

## 2016-07-16 DIAGNOSIS — Z79899 Other long term (current) drug therapy: Secondary | ICD-10-CM

## 2016-07-16 DIAGNOSIS — R0602 Shortness of breath: Secondary | ICD-10-CM | POA: Diagnosis not present

## 2016-07-16 LAB — BASIC METABOLIC PANEL
Anion gap: 9 (ref 5–15)
BUN: 29 mg/dL — ABNORMAL HIGH (ref 6–20)
CHLORIDE: 107 mmol/L (ref 101–111)
CO2: 24 mmol/L (ref 22–32)
Calcium: 9.4 mg/dL (ref 8.9–10.3)
Creatinine, Ser: 1.49 mg/dL — ABNORMAL HIGH (ref 0.61–1.24)
GFR calc non Af Amer: 45 mL/min — ABNORMAL LOW (ref >60)
GFR, EST AFRICAN AMERICAN: 53 mL/min — AB (ref >60)
Glucose, Bld: 93 mg/dL (ref 65–99)
POTASSIUM: 3.5 mmol/L (ref 3.5–5.1)
SODIUM: 140 mmol/L (ref 135–145)

## 2016-07-16 LAB — CBC
HEMATOCRIT: 44.7 % (ref 39.0–52.0)
HEMOGLOBIN: 14.8 g/dL (ref 13.0–17.0)
MCH: 31.6 pg (ref 26.0–34.0)
MCHC: 33.1 g/dL (ref 30.0–36.0)
MCV: 95.3 fL (ref 78.0–100.0)
Platelets: 218 10*3/uL (ref 150–400)
RBC: 4.69 MIL/uL (ref 4.22–5.81)
RDW: 13.5 % (ref 11.5–15.5)
WBC: 8.2 10*3/uL (ref 4.0–10.5)

## 2016-07-16 LAB — BRAIN NATRIURETIC PEPTIDE: B NATRIURETIC PEPTIDE 5: 304.1 pg/mL — AB (ref 0.0–100.0)

## 2016-07-16 LAB — PROTIME-INR
INR: 1.04
PROTHROMBIN TIME: 13.6 s (ref 11.4–15.2)

## 2016-07-16 LAB — TROPONIN I: Troponin I: <0.03 ng/mL (ref <0.03)

## 2016-07-16 LAB — I-STAT TROPONIN, ED: Troponin i, poc: 0.01 ng/mL (ref 0.00–0.08)

## 2016-07-16 MED ORDER — DILTIAZEM HCL-DEXTROSE 100-5 MG/100ML-% IV SOLN (PREMIX)
5.0000 mg/h | INTRAVENOUS | Status: DC
  Administered 2016-07-17: 5 mg/h via INTRAVENOUS
  Filled 2016-07-16: qty 100

## 2016-07-16 MED ORDER — FUROSEMIDE 10 MG/ML IJ SOLN
40.0000 mg | Freq: Two times a day (BID) | INTRAMUSCULAR | Status: DC
  Administered 2016-07-17: 40 mg via INTRAVENOUS
  Filled 2016-07-16: qty 4

## 2016-07-16 MED ORDER — ONDANSETRON HCL 4 MG/2ML IJ SOLN: 4.0000 mg | Freq: Four times a day (QID) | INTRAMUSCULAR | Status: DC | PRN

## 2016-07-16 MED ORDER — METOPROLOL SUCCINATE ER 100 MG PO TB24: 100.0000 mg | ORAL_TABLET | Freq: Every morning | ORAL | Status: DC

## 2016-07-16 MED ORDER — ACETAMINOPHEN 325 MG PO TABS: 650.0000 mg | ORAL_TABLET | Freq: Four times a day (QID) | ORAL | Status: DC | PRN

## 2016-07-16 MED ORDER — HEPARIN (PORCINE) IN NACL 100-0.45 UNIT/ML-% IJ SOLN
1450.0000 [IU]/h | INTRAMUSCULAR | Status: DC
  Administered 2016-07-16: 1300 [IU]/h via INTRAVENOUS
  Administered 2016-07-17: 1450 [IU]/h via INTRAVENOUS
  Filled 2016-07-16 (×3): qty 250

## 2016-07-16 MED ORDER — ASPIRIN EC 81 MG PO TBEC: 81.0000 mg | DELAYED_RELEASE_TABLET | Freq: Every day | ORAL | Status: DC

## 2016-07-16 MED ORDER — FENOFIBRATE 160 MG PO TABS
160.0000 mg | ORAL_TABLET | Freq: Every day | ORAL | Status: DC
Start: 2016-07-17 — End: 2016-07-17
  Administered 2016-07-17: 160 mg via ORAL
  Filled 2016-07-16: qty 1

## 2016-07-16 MED ORDER — DIPHENHYDRAMINE HCL 25 MG PO CAPS
25.0000 mg | ORAL_CAPSULE | Freq: Every day | ORAL | Status: DC
  Administered 2016-07-16: 25 mg via ORAL
  Filled 2016-07-16 (×2): qty 1

## 2016-07-16 MED ORDER — CLOPIDOGREL BISULFATE 75 MG PO TABS
75.0000 mg | ORAL_TABLET | Freq: Every day | ORAL | Status: DC
  Administered 2016-07-16 – 2016-07-17 (×2): 75 mg via ORAL
  Filled 2016-07-16 (×2): qty 1

## 2016-07-16 MED ORDER — NITROGLYCERIN 0.4 MG SL SUBL: 0.4000 mg | SUBLINGUAL_TABLET | SUBLINGUAL | Status: DC | PRN

## 2016-07-16 MED ORDER — SODIUM CHLORIDE 0.9 % IV SOLN: 250.0000 mL | INTRAVENOUS | Status: DC | PRN

## 2016-07-16 MED ORDER — DEXTROSE 5 % IV SOLN
5.0000 mg/h | Freq: Once | INTRAVENOUS | Status: AC
  Administered 2016-07-16: 5 mg/h via INTRAVENOUS
  Filled 2016-07-16: qty 100

## 2016-07-16 MED ORDER — FUROSEMIDE 10 MG/ML IJ SOLN
40.0000 mg | Freq: Once | INTRAMUSCULAR | Status: AC
  Administered 2016-07-16: 40 mg via INTRAVENOUS
  Filled 2016-07-16: qty 4

## 2016-07-16 MED ORDER — ONDANSETRON HCL 4 MG PO TABS: 4.0000 mg | ORAL_TABLET | Freq: Four times a day (QID) | ORAL | Status: DC | PRN

## 2016-07-16 MED ORDER — ATORVASTATIN CALCIUM 40 MG PO TABS
40.0000 mg | ORAL_TABLET | Freq: Every morning | ORAL | Status: DC
  Administered 2016-07-17: 40 mg via ORAL
  Filled 2016-07-16: qty 1

## 2016-07-16 MED ORDER — FERROUS SULFATE 325 (65 FE) MG PO TABS
325.0000 mg | ORAL_TABLET | Freq: Every morning | ORAL | Status: DC
  Administered 2016-07-17: 325 mg via ORAL
  Filled 2016-07-16: qty 1

## 2016-07-16 MED ORDER — PANTOPRAZOLE SODIUM 40 MG PO TBEC
40.0000 mg | DELAYED_RELEASE_TABLET | Freq: Every day | ORAL | Status: DC
  Administered 2016-07-17: 40 mg via ORAL
  Filled 2016-07-16: qty 1

## 2016-07-16 MED ORDER — ACETAMINOPHEN 500 MG PO TABS
500.0000 mg | ORAL_TABLET | Freq: Every day | ORAL | Status: DC
  Administered 2016-07-16: 500 mg via ORAL
  Filled 2016-07-16 (×2): qty 1

## 2016-07-16 MED ORDER — SODIUM CHLORIDE 0.9% FLUSH: 3.0000 mL | Freq: Two times a day (BID) | INTRAVENOUS | Status: DC

## 2016-07-16 MED ORDER — SODIUM CHLORIDE 0.9% FLUSH: 3.0000 mL | INTRAVENOUS | Status: DC | PRN

## 2016-07-16 MED ORDER — HEPARIN BOLUS VIA INFUSION
4000.0000 [IU] | Freq: Once | INTRAVENOUS | Status: AC
  Administered 2016-07-16: 4000 [IU] via INTRAVENOUS
  Filled 2016-07-16: qty 4000

## 2016-07-16 MED ORDER — DIPHENHYDRAMINE-APAP (SLEEP) 25-500 MG PO TABS: 1.0000 | ORAL_TABLET | Freq: Every day | ORAL | Status: DC

## 2016-07-16 MED ORDER — ACETAMINOPHEN 650 MG RE SUPP: 650.0000 mg | Freq: Four times a day (QID) | RECTAL | Status: DC | PRN

## 2016-07-16 NOTE — Consult Note (Signed)
Cardiology Consult    Patient ID: Peter [name] MRN: [id number], DOB/AGE: 01/13/1946   Admit date: 07/16/2016 Date of Consult: 07/16/2016  Primary Physician: [name], MD Reason for Consult: Atrial Fibrillation Primary Cardiologist: [name] Requesting Provider: Dr. Ralene Bathe  History of Present Illness    Peter [name] is a 70 y.o. male with past medical history of CAD (s/p PCI to LAD in 2006 with bifurcation lesion and Plavix indefinitely was recommended), HTN, and dyslipidemia who presents to [facility] ED on 07/16/2016 for worsening dyspnea.   Patient reports ever since last Thursday (07/11/2016) he has noted worsening dyspnea with exertion. Was present over the weekend and says he had severe dyspnea while walking into [employer] and has to stop and rest multiple times while in the store. This morning, he had dyspnea while at rest. He works as a courier for the [employer] and after walking into the office building, he says he sat in a chair to rest and the receptionist there called EMS.   He denies any associated chest discomfort or palpitations. Says when he experienced his MI in 2006, his symptoms were exertional dyspnea and bilateral jaw pain.  While in the ED, labs show a WBC of 8.2, Hgb 14.8, and platelets 218. BMET with K+ 3.5 and creatinine 1.49 (variable between 1.2 - 1.6 in the past 4 years). Initial troponin negative. EKG shows new-onset atrial fibrillation with HR of 116 and PVC's. CXR with borderline cardiomegaly and vascular congestion.   Past Medical History   Past Medical History:  Diagnosis Date  . At risk for sleep apnea    STOP-BANG= 5       SENT TO PCP 07-12-2015  . CAD (coronary artery disease) cardiologist-  dr [name]   PCI w/ DES x3  to  LAD 2006, complex bifurcation lesion, use Plavix indefinitely  . Dyslipidemia   . Dyspnea on exertion   . GERD (gastroesophageal reflux disease)   . History of GI bleed   . History of kidney stones   .  History of MI (myocardial infarction)    11-05-2005--  periprocedural MI  (cardiac cath) per dr [name] note  . Hypertension   . Hypertriglyceridemia   . Iron deficiency anemia   . Right ureteral stone   . S/P drug eluting coronary stent placement    x3  to LAD  2006    Past Surgical History:  Procedure Laterality Date  . CARDIOVASCULAR STRESS TEST  01-06-2013  dr [name]   normal perfusion study/  no ischemia or infarct/  normal LV function and wall motion , ef 57%  . CORONARY ANGIOPLASTY  11-13-2005 dr [name]   Successful touch up post-dilatation of the 3 tandem overlying Cypher stents in proximal to mid LAD (based on IVUS 0% stenosis and patent diagonal branch)/  Cutting Balloon Angioplasty of Ramus branch  . CORONARY ANGIOPLASTY WITH STENT PLACEMENT  11-05-2005   dr [name]   PCI and DES x3 to birfurcation lesion LAD and diagonal branch of LAD/   20% dLM,  30-40% RCA, ef 60%  . CYSTOSCOPY WITH RETROGRADE PYELOGRAM, URETEROSCOPY AND STENT PLACEMENT Right 07/14/2015   Procedure: CYSTOSCOPY WITH RETROGRADE PYELOGRAM, URETEROSCOPY AND STENT PLACEMENT;  Surgeon: Alexis Frock, MD;  Location: [facility];  Service: Urology;  Laterality: Right;  . EXTRACORPOREAL SHOCK WAVE LITHOTRIPSY  yrs ago  . LEFT URETEROSCOPIC STONE EXTRACTION  04-10-2006  . STONE EXTRACTION WITH BASKET Right 07/14/2015   Procedure: STONE EXTRACTION WITH  BASKET;  Surgeon: Alexis Frock, MD;  Location: [facility];  Service: Urology;  Laterality: Right;  . TRANSTHORACIC ECHOCARDIOGRAM  02-04-2012   grade I diastolic dysfunction/  ef 55-60%/  trivial MR and TR/  mild LAE     Allergies  No Known Allergies  Inpatient Medications       Family History    Family History  Problem Relation Age of Onset  . Heart attack Father 13    Social History    Social History   Social History  . Marital status: Married    Spouse name: N/A  . Number of children: N/A  . Years of education: N/A    Occupational History  . Not on file.   Social History Main Topics  . Smoking status: Never Smoker  . Smokeless tobacco: Never Used  . Alcohol use No  . Drug use: No  . Sexual activity: Not on file   Other Topics Concern  . Not on file   Social History Narrative  . No narrative on file     Review of Systems    General:  No chills, fever, night sweats or weight changes.  Cardiovascular:  No chest pain, edema, orthopnea, palpitations, paroxysmal nocturnal dyspnea. Positive for dyspnea with exertion.  Dermatological: No rash, lesions/masses Respiratory: No cough, dyspnea Urologic: No hematuria, dysuria Abdominal:   No nausea, vomiting, diarrhea, bright red blood per rectum, melena, or hematemesis Neurologic:  No visual changes, wkns, changes in mental status. All other systems reviewed and are otherwise negative except as noted above.  Physical Exam    Blood pressure (!) 142/104, pulse 100, temperature 97.8 F (36.6 C), temperature source Oral, resp. rate 25, height 5\' 7"  (1.702 m), weight 232 lb (105.2 kg), SpO2 98 %.  General: Pleasant, Caucasian male appearing in NAD Psych: Normal affect. Neuro: Alert and oriented X 3. Moves all extremities spontaneously. HEENT: Normal  Neck: Supple without bruits or JVD. Lungs:  Resp regular and unlabored, CTA without wheezing or rales. Heart: Irregularly irregular, no s3, s4, or murmurs. Abdomen: Soft, non-tender, non-distended, BS + x 4.  Extremities: No clubbing, cyanosis or edema. DP/PT/Radials 2+ and equal bilaterally.  Labs    Troponin [facility] Test)  Recent Labs  07/16/16 1343  TROPIPOC 0.01   No results for input(s): CKTOTAL, CKMB, TROPONINI in the last 72 hours. Lab Results  Component Value Date   WBC 8.2 07/16/2016   HGB 14.8 07/16/2016   HCT 44.7 07/16/2016   MCV 95.3 07/16/2016   PLT 218 07/16/2016    Recent Labs Lab 07/16/16 1332  NA 140  K 3.5  CL 107  CO2 24  BUN 29*  CREATININE 1.49*    CALCIUM 9.4  GLUCOSE 93   Lab Results  Component Value Date   CHOL 140 01/01/2013   HDL 38.20 (L) 01/01/2013   TRIG 230.0 (H) 01/01/2013   Lab Results  Component Value Date   DDIMER 0.32 02/03/2012     Radiology Studies    Dg Chest 2 View  Result Date: 07/16/2016 CLINICAL DATA:  Heart attack 3 years ago. Increasing shortness of breath and hard time getting a deep breath starting about 3 days ago. EXAM: CHEST  2 VIEW COMPARISON:  02/03/2012 FINDINGS: Low volume film. Cardiopericardial silhouette is at upper limits of normal for size. There is pulmonary vascular congestion without overt pulmonary edema. No focal airspace consolidation or pleural effusion. Probable small hiatal hernia. The visualized bony structures of the thorax are  intact. Telemetry leads overlie the chest. IMPRESSION: Borderline cardiomegaly with vascular congestion. Electronically Signed   By: Misty Stanley M.D.   On: 07/16/2016 13:53    EKG & Cardiac Imaging    EKG: New-onset atrial fibrillation with HR of 116 and PVC's.  Echocardiogram: 01/2012 Study Conclusions  - Left ventricle: The cavity size was normal. Wall thickness was normal. Systolic function was normal. The estimated ejection fraction was in the range of 55% to 60%. Wall motion was normal; there were no regional wall motion abnormalities. Doppler parameters are consistent with abnormal left ventricular relaxation (grade 1 diastolic dysfunction). - Left atrium: The atrium was mildly dilated.  CARDIAC CATHETERIZATION: 10/2005  CLINICAL HISTORY:  Mr. Lopeman is 70 years old and had no prior history of  known heart disease and was admitted to the hospital with progressive angina  over the past 10 days.  His troponins were negative.  A diagnostic procedure  was performed earlier in the day by Dr. Johnsie Cancel and showed a tight branch  stenosis involving the left anterior descending and the first diagonal  branch.  There was also an 80%  lesion in the fairly large ramus branch of  the circumflex artery.  His creatinine was 1.3 and we elected to proceed  with intervention on the bifurcation lesion.   PROCEDURE:  The procedure was performed with the right femoral artery as an  arterial sheath and a 7 French Q 3.5 guiding catheter with side holes.  The  patient was given Angiomax bolus infusion and had already been loaded with  Plavix on admission.  We initially crossed the lesion in the LAD with a pro  water wire without too much difficulty.  We then passed the BMW wire down  the diagonal branch.  We dilated in the LAD with a 2.5 x 20 mm Maverick  performing two inflations up to 8 atmospheres for 30 seconds.  We then  dilated in the diagonal branch with a 2.5 x 12 mm Maverick performing two  inflations up to 8 atmospheres for 30 seconds.  We then deployed a 2.5 x 23  mm Cypher stent in the proximal LAD across the diagonal branch.  We deployed  this with one inflation of 11 atmospheres for 30 seconds.  Following  deployment of the stent, we realized that there was the distal edge of the  stent did not completely cover the lesion so we placed a second 2.5 x 8 mm  Cypher stent distal to the first stent and just overlapping the first stent.  Shortly after placement of this stent, the patient developed chest pain and  was found to have complete occlusion of the LAD.  We went back in with a 2.5  x 20 mm Maverick and performed dilatation within the stent and reestablished  flow.  It appeared there was a filling defect right at the proximal edge of  the stent which extended into the first stent.  We were not certain whether  this represented an edge tear or an in situ thrombosis.  It appeared that  the stent was not full expanded in its mid portion so it could have been in  stent thrombosis related to that.  In order to salvage the situation, we  elected to place a third 2.75 x 13 mm Cypher stent proximal to and  overlapping the  first stent.  We deployed this with one inflation of 14  atmospheres per 30 seconds.  We had previously reestablished flow with  balloon dilatation with a 2.5 x 20 mm Maverick.  At this point, we decided  to abandon any further attempts at salvaging the diagonal branch and we used  a 2.75 x 20 mm quantum Maverick and dilated three times within the three  stents, avoiding the distal edge.  We went up to a total of 20 atmospheres  for 20 seconds.  Final diagnostic studies were then performed through the  guiding catheter.   The procedure was extremely difficult even before occlusion of the LAD.  The  patient had severe back pain and required high doses of Versed, fentanyl and  Nubain without complete relief of his back pain.  At the end of the  procedure after we had reopened the LAD and post dilated the stent in the  LAD, he was more stable with minimal pain although the flow in the diagonal  branch was only TIMI-I.  We closed the right femoral artery with Angio-Seal  at the end of the procedure after obtaining and electrocardiogram.   RESULTS:  Initially, the stenosis in the left anterior descending was  located just before and just after the diagonal branch and was estimated at  90-95%.  Following placement of the three overlapping stents, this stenosis  improved to less than 10%.  The diagonal branch initially was only about 70%  narrowed at its ostium and this was pinched to about 90% with TIMI-I flow at  the end of the procedure.   Once we developed complete occlusion of the LAD, we added Integrilin double  bolus and infusion.  We also gave an additional 300 mg of Plavix and 20 mg  of Pepcid.   CONCLUSION:  PCI of the bifurcation lesion of the left anterior descending  and diagonal branch of the left anterior descending with improvement in the  left anterior descending stenosis from 90% to less than 10% but residual 90%  stenosis in the diagonal branch with TIMI-I flow.  The  procedure was  complicated by transient occlusion of the left anterior descending requiring  an additional, (third), stent.   DISPOSITION:  The patient returned PACU for further observation.  Will plan  to continue Angiomax for two more hours and continue the Integrilin for 24  hours.  His post intervention electrocardiogram showed 0.5 mm lateral ST  elevation probably indicative of the TIMI-I flow in the diagonal branch.  He  almost certainly will have suffered a periprocedural infarction which  hopefully will not be large.  In spite the difficulty of the procedure, he  will probably need intervention of the ramus branch at some later time.  Will also need to follow his creatinine over the next 48-72 hours.   Assessment & Plan    1. New Onset Atrial Fibrillation - reports worsening dyspnea with exertion since last Thursday (07/11/2016), now occurring at rest. Denies any chest discomfort or palpitations. No history of atrial fibrillation.  - labs show a WBC of 8.2, Hgb 14.8, and platelets 218. BMET with K+ 3.5 and creatinine 1.49 (variable between 1.2 - 1.6 in the past 4 years). Initial troponin negative. EKG shows new-onset atrial fibrillation with HR of 116 and PVC's. CXR with borderline cardiomegaly and vascular congestion. - will check Mg and TSH. Cycle troponin values. Will obtain echocardiogram to assess LV function and wall motion. - This patients CHA2DS2-VASc Score and unadjusted Ischemic Stroke Rate (% per year) is equal to 3.2 % stroke rate/year from a score of 3 (HTN, Vascular, Age). Heparin per pharmacy  consult. Anticipate switching to NOAC tomorrow.  - started on IV Cardizem for rate-control. Anticipate switching to PO tomorrow if HR improved. If becomes hypotensive and remains symptomatic, would need to consider TEE guided DCCV but would require 3 doses of NOAC prior to this with his atrial fibrillation likely starting more than 48 hours ago.   2. CAD - s/p PCI to LAD in 2006  with bifurcation lesion and Plavix indefinitely was recommended. Anginal symptoms at that time included exertional dyspnea and jaw pain. Denies any jaw discomfort at this time. Last NST in 12/2012 without evidence of ischemia.  - will obtain repeat echocardiogram to evaluate LV function and wall motion.  - continue ASA, Plavix, and statin. Will need to stop ASA due to indication for long-term anticoagulation and the need to avoid triple therapy.   3. HLD - continue statin therapy.  4. HTN - started on IV Cardizem while in the ED. Would hold Amlodipine, Prinzide, and Toprol-XL on admission with SBP currently in the 90's. Will need to stop Amlodipine long-term if he remains on Cardizem long-term.    Signed, Erma Heritage, PA-C 07/16/2016, 3:20 PM Pager: [phone]   I have personally seen and examined this patient with Bernerd Pho, PA-C. I agree with the assessment and plan as outlined above. He has known CAD with PCI of the LAD with DES x 3 in 2006. No angina at home. He has been feeling dyspneic for almost a week. He was weak and dizzy today with dyspnea. Found to be in atrial fib with RVR. He is now on Cardizem drip with better HR control and feeling somewhat better. My exam shows an overweight male in NAD, CV: irreg irreg, Lungs: faint wheezes, Ext: no edema. Labs reviewed by me. EKG reviewed by me. This shows atrial fib with rate of 116 bpm, PVC, prolonged QT.  Agree with admitting to hospitalist service here at [facility] with plan for rate control with IV Cardizem, anti-coagulation with IV heparin (until it is clear he does not need invasive testing such as cardiac cath). Would continue Plavix for now but hold ASA. Cycle troponin to exclude ACS but it seems that his symptoms are more likely related to his arrhythmia than ischemia. If we cannot control his HR with medications or if he has inadequate symptomatic improvement with rate control only, will have to consider TEE/DCCV.    Lauree Chandler 07/16/2016 4:42 PM

## 2016-07-16 NOTE — ED Triage Notes (Signed)
Patient reports he had a heart attack 3 years ago; 3 stents. Starting Sauturday afternoon he has had increasing shortness of breath; hard time getting a deep breath.

## 2016-07-16 NOTE — H&P (Signed)
History and Physical    Peter [name] [id number] DOB: 1945-12-10 DOA: 07/16/2016  PCP: [name], MD   Patient coming from: Home  Chief Complaint: Exertional dyspnea  HPI: Peter [name] is a 70 y.o. male with medical history significant of ischemic heart disease, presents to the hospital with worsening dyspnea on exertion. For last 5 days patient has been developing dyspnea on exertion. It has been moderate to severe in intensity, it has limited significantly his physical functional capacity, it has been persistent and worsening, associated with orthopnea and PND but no lower extremity edema. No improving factors  worse with exertion.  Denies any chest pain or palpitations. He is not on furosemide. He follows up regularly with his cardiologist, stress test 3 years ago apparently was negative for acute ischemia.   ED Course:  IV diltiazem infusion  Review of Systems:  1. Gen. no fever chills no weight gain or weight loss 2. ENT no runny nose or sore throat 3. Cardiovascular no angina or claudication 4. Pulmonary positive for shortness of breath as mentioned in history present illness 5. Gastrointestinal no nausea vomiting or diarrhea 6. Musculoskeletal no joint pain 7. Dermatology no rashes 8. Urology no dysuria or increased urinary frequency 9. Hematology no easy bruisability or frequent infections 10. Neurology no seizures or paresthesias  Past Medical History:  Diagnosis Date  . At risk for sleep apnea    STOP-BANG= 5       SENT TO PCP 07-12-2015  . CAD (coronary artery disease) cardiologist-  dr [name]   PCI w/ DES x3  to  LAD 2006, complex bifurcation lesion, use Plavix indefinitely  . Dyslipidemia   . Dyspnea on exertion   . GERD (gastroesophageal reflux disease)   . History of GI bleed   . History of kidney stones   . History of MI (myocardial infarction)    11-05-2005--  periprocedural MI  (cardiac cath) per dr [name] note  . Hypertension   .  Hypertriglyceridemia   . Iron deficiency anemia   . Right ureteral stone   . S/P drug eluting coronary stent placement    x3  to LAD  2006    Past Surgical History:  Procedure Laterality Date  . CARDIOVASCULAR STRESS TEST  01-06-2013  dr [name]   normal perfusion study/  no ischemia or infarct/  normal LV function and wall motion , ef 57%  . CORONARY ANGIOPLASTY  11-13-2005 dr [name]   Successful touch up post-dilatation of the 3 tandem overlying Cypher stents in proximal to mid LAD (based on IVUS 0% stenosis and patent diagonal branch)/  Cutting Balloon Angioplasty of Ramus branch  . CORONARY ANGIOPLASTY WITH STENT PLACEMENT  11-05-2005   dr [name]   PCI and DES x3 to birfurcation lesion LAD and diagonal branch of LAD/   20% dLM,  30-40% RCA, ef 60%  . CYSTOSCOPY WITH RETROGRADE PYELOGRAM, URETEROSCOPY AND STENT PLACEMENT Right 07/14/2015   Procedure: CYSTOSCOPY WITH RETROGRADE PYELOGRAM, URETEROSCOPY AND STENT PLACEMENT;  Surgeon: Alexis Frock, MD;  Location: [facility];  Service: Urology;  Laterality: Right;  . EXTRACORPOREAL SHOCK WAVE LITHOTRIPSY  yrs ago  . LEFT URETEROSCOPIC STONE EXTRACTION  04-10-2006  . STONE EXTRACTION WITH BASKET Right 07/14/2015   Procedure: STONE EXTRACTION WITH BASKET;  Surgeon: Alexis Frock, MD;  Location: [facility];  Service: Urology;  Laterality: Right;  . TRANSTHORACIC ECHOCARDIOGRAM  02-04-2012   grade I diastolic dysfunction/  ef 55-60%/  trivial MR and TR/  mild LAE     reports that he has never smoked. He has never used smokeless tobacco. He reports that he does not drink alcohol or use drugs.  No Known Allergies  Family History  Problem Relation Age of Onset  . Heart attack Father 48     Prior to Admission medications   Medication Sig Start Date End Date Taking? Authorizing Provider  amLODipine (NORVASC) 10 MG tablet Take 1 tablet (10 mg total) by mouth every morning. 02/16/16  Yes Jerline Pain, MD    aspirin EC 81 MG tablet Take 81 mg by mouth daily.   Yes Historical Provider, MD  atorvastatin (LIPITOR) 40 MG tablet Take 1 tablet (40 mg total) by mouth every morning. 02/16/16  Yes Jerline Pain, MD  clopidogrel (PLAVIX) 75 MG tablet TK 1 T PO QD 02/16/16  Yes Jerline Pain, MD  diphenhydramine-acetaminophen (TYLENOL PM) 25-500 MG TABS tablet Take 1 tablet by mouth at bedtime.   Yes Historical Provider, MD  fenofibrate (TRICOR) 145 MG tablet Take 145 mg by mouth daily.   Yes Historical Provider, MD  ferrous sulfate 325 (65 FE) MG tablet Take 325 mg by mouth every morning.    Yes Historical Provider, MD  lisinopril-hydrochlorothiazide (PRINZIDE,ZESTORETIC) 20-12.5 MG tablet Take 1 tablet by mouth daily.   Yes Historical Provider, MD  metoprolol succinate (TOPROL-XL) 100 MG 24 hr tablet Take 1 tablet (100 mg total) by mouth every morning. Take with or immediately following a meal. 02/16/16  Yes Jerline Pain, MD  nitroGLYCERIN (NITROSTAT) 0.4 MG SL tablet Place 1 tablet (0.4 mg total) under the tongue every 5 (five) minutes as needed for chest pain. 02/16/16  Yes Jerline Pain, MD  pantoprazole (PROTONIX) 40 MG tablet Take 40 mg by mouth daily.    Yes Historical Provider, MD  methylPREDNISolone (MEDROL DOSEPAK) 4 MG TBPK tablet follow package directions Patient not taking: Reported on 07/16/2016 04/11/16   Wallene Huh, DPM    Physical Exam: Vitals:   07/16/16 1429 07/16/16 1430 07/16/16 1500 07/16/16 1542  BP: (!) 142/104 (!) 142/104 111/55 96/77  Pulse: 100 101 (!) 47 98  Resp: 25 26 17 25   Temp:      TempSrc:      SpO2: 98% 96% 92% 96%  Weight:      Height:          Constitutional: In mild dyspnea but no pain Vitals:   07/16/16 1429 07/16/16 1430 07/16/16 1500 07/16/16 1542  BP: (!) 142/104 (!) 142/104 111/55 96/77  Pulse: 100 101 (!) 47 98  Resp: 25 26 17 25   Temp:      TempSrc:      SpO2: 98% 96% 92% 96%  Weight:      Height:       Eyes: PERRL, lids, conjunctivae With mild  pallor, no icterus Nose and ears no deformities ENMT: Mucous membranes are moist. Posterior pharynx clear of any exudate or lesions.Normal dentition.  Neck: normal, supple, no masses, no thyromegaly Respiratory: , no wheezing, mild by basilar crackles. Normal respiratory effort. No accessory muscle use.  Cardiovascular: S1-S2 present irregularly irregular, no murmurs / rubs / gallops. No extremity edema. 2+ pedal pulses. No carotid bruits.  Abdomen: no tenderness, no masses palpated. No hepatosplenomegaly. Bowel sounds positive.  Musculoskeletal: no clubbing / cyanosis. No joint deformity upper and lower extremities. Good ROM, no contractures. Normal muscle tone.  Skin: no rashes, lesions, ulcers. No induration Neurologic: CN 2-12 grossly intact. Sensation  intact, DTR normal. Strength 5/5 in all 4.    Labs on Admission: I have personally reviewed following labs and imaging studies  CBC:  Recent Labs Lab 07/16/16 1332  WBC 8.2  HGB 14.8  HCT 44.7  MCV 95.3  PLT 99991111   Basic Metabolic Panel:  Recent Labs Lab 07/16/16 1332  NA 140  K 3.5  CL 107  CO2 24  GLUCOSE 93  BUN 29*  CREATININE 1.49*  CALCIUM 9.4   GFR: Estimated Creatinine Clearance: 52.5 mL/min (by C-G formula based on SCr of 1.49 mg/dL). Liver Function Tests: No results for input(s): AST, ALT, ALKPHOS, BILITOT, PROT, ALBUMIN in the last 168 hours. No results for input(s): LIPASE, AMYLASE in the last 168 hours. No results for input(s): AMMONIA in the last 168 hours. Coagulation Profile:  Recent Labs Lab 07/16/16 1332  INR 1.04   Cardiac Enzymes: No results for input(s): CKTOTAL, CKMB, CKMBINDEX, TROPONINI in the last 168 hours. BNP (last 3 results) No results for input(s): PROBNP in the last 8760 hours. HbA1C: No results for input(s): HGBA1C in the last 72 hours. CBG: No results for input(s): GLUCAP in the last 168 hours. Lipid Profile: No results for input(s): CHOL, HDL, LDLCALC, TRIG, CHOLHDL,  LDLDIRECT in the last 72 hours. Thyroid Function Tests: No results for input(s): TSH, T4TOTAL, FREET4, T3FREE, THYROIDAB in the last 72 hours. Anemia Panel: No results for input(s): VITAMINB12, FOLATE, FERRITIN, TIBC, IRON, RETICCTPCT in the last 72 hours. Urine analysis: No results found for: COLORURINE, APPEARANCEUR, LABSPEC, PHURINE, GLUCOSEU, HGBUR, BILIRUBINUR, KETONESUR, PROTEINUR, UROBILINOGEN, NITRITE, LEUKOCYTESUR Sepsis Labs: !!!!!!!!!!!!!!!!!!!!!!!!!!!!!!!!!!!!!!!!!!!! @LABRCNTIP (procalcitonin:4,lacticidven:4) )No results found for this or any previous visit (from the past 240 hour(s)).   Radiological Exams on Admission: Dg Chest 2 View  Result Date: 07/16/2016 CLINICAL DATA:  Heart attack 3 years ago. Increasing shortness of breath and hard time getting a deep breath starting about 3 days ago. EXAM: CHEST  2 VIEW COMPARISON:  02/03/2012 FINDINGS: Low volume film. Cardiopericardial silhouette is at upper limits of normal for size. There is pulmonary vascular congestion without overt pulmonary edema. No focal airspace consolidation or pleural effusion. Probable small hiatal hernia. The visualized bony structures of the thorax are intact. Telemetry leads overlie the chest. IMPRESSION: Borderline cardiomegaly with vascular congestion. Electronically Signed   By: Misty Stanley M.D.   On: 07/16/2016 13:53    EKG: Independently reviewed. Atrial fibrillation with a rate of 116, left axis deviation, positive PVC.  Chest Film. Personal review AP and lateral film. Hyperinflated, increased vascular markings bilaterally, with cephalization of the vasculature.   Assessment/Plan Active Problems:   New onset a-fib [facility])   Atrial fibrillation ([place])   This is a 70 year old male who presents to the hospital with the chief complaint of worsening exertional dyspnea that has been involved over the last 5 days in to orthopnea and PND. On the physical examination his blood pressure is 111/55, his heart  rate is 107 bpm, respiration 17, oxygen saturation 96% on room air, patient on a diltiazem drip. His neck has no JVD, he does have bibasilar Rales on his lung examination, heart S1-S2 present irregularly irregular, no lower extremity edema. His sodium 140, potassium 3.5, crit 24, creatinine 1.49, BNP 304, white count 8.2, he will of 14.8, hematocrit 44.7, platelet count 218. EKG with atrial fibrillation, chest film with signs of cardiogenic pulmonary edema.   Working diagnosis. Acute diastolic heart failure due to new-onset atrial fibrillation, complicated by cardiogenic pulmonary edema.  1. Diastolic heart failure.  Patient will be diuresis with furosemide 40 mg IV twice daily, strict in and out's and daily weights. Will follow up on echocardiography. Continue rate control Cardizem drip.  2. New onset atrial fibrillation. Will continue rate control with a diltiazem drip, anticoagulation with heparin drip, follow-up on echocardiography.chadsVsc score 3. Will need long-term anticoagulation. Will continue metoprolol succinate 100 mg daily, if good toleration to diltiazem, non-dehydropyridine might be an option for rate control  3. Coronary artery disease. Continue aspirin/ Plavix and atorvastatin. Will discuss with cardiology it is possible to discontinue Plavix to prevent bleeding, patient will need long-term anticoagulation for his atrial fibrillation.  4. Dyslipidemia continue fenofibrate and atorvastatin  Patient continued to be at high risk of developing worsening atrial fibrillation and heart failure.  DVT prophylaxis: Heparin  Code Status: Full  Family Communication: I spoke with patient's family at the bedside and all questions were addressed. Disposition Plan: home Consults called: cardiology by the ED  Admission status: Inpatient.   Tyneisha Hegeman Gerome Apley MD [facility] Pager [phone]  If 7PM-7AM, please contact night-coverage [email or web address] Password [email or web address]  07/16/2016, 6:00  PM

## 2016-07-16 NOTE — ED Notes (Signed)
Attempted x 2 IV access. Unsuccessful

## 2016-07-16 NOTE — Progress Notes (Signed)
ANTICOAGULATION CONSULT NOTE - Initial Consult  Pharmacy Consult for heparin Indication: atrial fibrillation  No Known Allergies  Patient Measurements: Height: 5\' 7"  (170.2 cm) Weight: 232 lb (105.2 kg) IBW/kg (Calculated) : 66.1 Heparin Dosing Weight: 89 kg  Vital Signs: Temp: 97.8 F (36.6 C) (09/05 1310) Temp Source: Oral (09/05 1310) BP: 96/77 (09/05 1542) Pulse Rate: 98 (09/05 1542)  Labs:  Recent Labs  07/16/16 1332  HGB 14.8  HCT 44.7  PLT 218  LABPROT 13.6  INR 1.04  CREATININE 1.49*    Estimated Creatinine Clearance: 52.5 mL/min (by C-G formula based on SCr of 1.49 mg/dL).   Medical History: Past Medical History:  Diagnosis Date  . At risk for sleep apnea    STOP-BANG= 5       SENT TO PCP 07-12-2015  . CAD (coronary artery disease) cardiologist-  dr [name]   PCI w/ DES x3  to  LAD 2006, complex bifurcation lesion, use Plavix indefinitely  . Dyslipidemia   . Dyspnea on exertion   . GERD (gastroesophageal reflux disease)   . History of GI bleed   . History of kidney stones   . History of MI (myocardial infarction)    11-05-2005--  periprocedural MI  (cardiac cath) per dr [name] note  . Hypertension   . Hypertriglyceridemia   . Iron deficiency anemia   . Right ureteral stone   . S/P drug eluting coronary stent placement    x3  to LAD  2006    Assessment: Patient is a 70 y.o M with hx CAD, presented to the ED on 9/5 with c/o SOB and was found to have new onset afib.  To start heparin drip for Afib.   Goal of Therapy:  Heparin level 0.3-0.7 units/ml Monitor platelets by anticoagulation protocol: Yes   Plan:  - heparin 4000 units IV x1 bolus, then drip at 1300 units/hr - check 8 hr heparin level - monitor for s/s bleeding  Zahria Ding P 07/16/2016,4:14 PM

## 2016-07-16 NOTE — ED Provider Notes (Signed)
[facility] DEPT Provider Note   CSN: [id number] Arrival date & time: 07/16/16  1258     History   Chief Complaint Chief Complaint  Patient presents with  . Shortness of Breath  . Chest Pain    HPI Peter [name] is a 70 y.o. male.  The history is provided by the patient. No language interpreter was used.  Shortness of Breath  Associated symptoms include chest pain.  Chest Pain   Associated symptoms include shortness of breath.    Peter [name] is a 70 y.o. male who presents to the Emergency Department complaining of SOB.  He reports 3-4 days of increased shortness of breath, dyspnea on exertion and orthopnea. He has diaphoresis at times. No fevers or chest pain. He has chronic lower extremity swelling, left greater than right and this is at his baseline. He has a history of coronary artery disease status post stenting 3 years ago. He denies any prior chest pain with his need for stents in the past. He takes Plavix daily. He has no history of atrial fibrillation. Symptoms are moderate, constant, worsening.  Past Medical History:  Diagnosis Date  . At risk for sleep apnea    STOP-BANG= 5       SENT TO PCP 07-12-2015  . CAD (coronary artery disease) cardiologist-  dr [name]   PCI w/ DES x3  to  LAD 2006, complex bifurcation lesion, use Plavix indefinitely  . Dyslipidemia   . Dyspnea on exertion   . GERD (gastroesophageal reflux disease)   . History of GI bleed   . History of kidney stones   . History of MI (myocardial infarction)    11-05-2005--  periprocedural MI  (cardiac cath) per dr [name] note  . Hypertension   . Hypertriglyceridemia   . Iron deficiency anemia   . Right ureteral stone   . S/P drug eluting coronary stent placement    x3  to LAD  2006    Patient Active Problem List   Diagnosis Date Noted  . New onset a-fib ([place]) 07/16/2016  . Atrial fibrillation ([place]) 07/16/2016  . Acute diastolic congestive heart failure ([place])   . Ejection fraction   .  Iron deficiency anemia due to chronic blood loss 02/04/2012  . Anemia 02/03/2012  . 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 DOE (dyspnea on exertion) 01/28/2012  . Hypertension   . CAD (coronary artery disease)   . Dyslipidemia   . GERD (gastroesophageal reflux disease)   . Overweight(278.02)     Past Surgical History:  Procedure Laterality Date  . CARDIOVASCULAR STRESS TEST  01-06-2013  dr [name]   normal perfusion study/  no ischemia or infarct/  normal LV function and wall motion , ef 57%  . CORONARY ANGIOPLASTY  11-13-2005 dr [name]   Successful touch up post-dilatation of the 3 tandem overlying Cypher stents in proximal to mid LAD (based on IVUS 0% stenosis and patent diagonal branch)/  Cutting Balloon Angioplasty of Ramus branch  . CORONARY ANGIOPLASTY WITH STENT PLACEMENT  11-05-2005   dr [name]   PCI and DES x3 to birfurcation lesion LAD and diagonal branch of LAD/   20% dLM,  30-40% RCA, ef 60%  . CYSTOSCOPY WITH RETROGRADE PYELOGRAM, URETEROSCOPY AND STENT PLACEMENT Right 07/14/2015   Procedure: CYSTOSCOPY WITH RETROGRADE PYELOGRAM, URETEROSCOPY AND STENT PLACEMENT;  Surgeon: Alexis Frock, MD;  Location: [facility];  Service: Urology;  Laterality: Right;  . EXTRACORPOREAL SHOCK WAVE LITHOTRIPSY  yrs ago  .  LEFT URETEROSCOPIC STONE EXTRACTION  04-10-2006  . STONE EXTRACTION WITH BASKET Right 07/14/2015   Procedure: STONE EXTRACTION WITH BASKET;  Surgeon: Alexis Frock, MD;  Location: [facility];  Service: Urology;  Laterality: Right;  . TRANSTHORACIC ECHOCARDIOGRAM  02-04-2012   grade I diastolic dysfunction/  ef 55-60%/  trivial MR and TR/  mild LAE       Home Medications    Prior to Admission medications   Medication Sig Start Date End Date Taking? Authorizing Provider  amLODipine (NORVASC) 10 MG tablet Take 1 tablet (10 mg total) by mouth every morning. 02/16/16  Yes Jerline Pain, MD  aspirin EC 81 MG tablet Take 81 mg by mouth  daily.   Yes Historical Provider, MD  atorvastatin (LIPITOR) 40 MG tablet Take 1 tablet (40 mg total) by mouth every morning. 02/16/16  Yes Jerline Pain, MD  clopidogrel (PLAVIX) 75 MG tablet TK 1 T PO QD 02/16/16  Yes Jerline Pain, MD  diphenhydramine-acetaminophen (TYLENOL PM) 25-500 MG TABS tablet Take 1 tablet by mouth at bedtime.   Yes Historical Provider, MD  fenofibrate (TRICOR) 145 MG tablet Take 145 mg by mouth daily.   Yes Historical Provider, MD  ferrous sulfate 325 (65 FE) MG tablet Take 325 mg by mouth every morning.    Yes Historical Provider, MD  lisinopril-hydrochlorothiazide (PRINZIDE,ZESTORETIC) 20-12.5 MG tablet Take 1 tablet by mouth daily.   Yes Historical Provider, MD  metoprolol succinate (TOPROL-XL) 100 MG 24 hr tablet Take 1 tablet (100 mg total) by mouth every morning. Take with or immediately following a meal. 02/16/16  Yes Jerline Pain, MD  nitroGLYCERIN (NITROSTAT) 0.4 MG SL tablet Place 1 tablet (0.4 mg total) under the tongue every 5 (five) minutes as needed for chest pain. 02/16/16  Yes Jerline Pain, MD  pantoprazole (PROTONIX) 40 MG tablet Take 40 mg by mouth daily.    Yes Historical Provider, MD  methylPREDNISolone (MEDROL DOSEPAK) 4 MG TBPK tablet follow package directions Patient not taking: Reported on 07/16/2016 04/11/16   Wallene Huh, DPM    Family History Family History  Problem Relation Age of Onset  . Heart attack Father 53    Social History Social History  Substance Use Topics  . Smoking status: Never Smoker  . Smokeless tobacco: Never Used  . Alcohol use No     Allergies   Review of patient's allergies indicates no known allergies.   Review of Systems Review of Systems  Respiratory: Positive for shortness of breath.   Cardiovascular: Positive for chest pain.  All other systems reviewed and are negative.    Physical Exam Updated Vital Signs BP (!) 148/100 (BP Location: Left Arm)   Pulse 77   Temp 98.4 F (36.9 C) (Oral)   Resp (!)  24   Ht 5\' 7"  (1.702 m)   Wt 232 lb (105.2 kg)   SpO2 98%   BMI 36.34 kg/m   Physical Exam  Constitutional: He is oriented to person, place, and time. He appears well-developed and well-nourished.  HENT:  Head: Normocephalic and atraumatic.  Cardiovascular:  No murmur heard. Tachycardic and irregular  Pulmonary/Chest: Effort normal and breath sounds normal. No respiratory distress.  Abdominal: Soft. There is no tenderness. There is no rebound and no guarding.  Musculoskeletal: He exhibits no tenderness.  1+ pitting edema to LLE, trace pitting edema to RLE  Neurological: He is alert and oriented to person, place, and time.  Skin: Skin is warm and  dry.  Psychiatric: He has a normal mood and affect. His behavior is normal.  Nursing note and vitals reviewed.    ED Treatments / Results  Labs (all labs ordered are listed, but only abnormal results are displayed) Labs Reviewed  BASIC METABOLIC PANEL - Abnormal; Notable for the following:       Result Value   BUN 29 (*)    Creatinine, Ser 1.49 (*)    GFR calc non Af Amer 45 (*)    GFR calc Af Amer 53 (*)    All other components within normal limits  BRAIN NATRIURETIC PEPTIDE - Abnormal; Notable for the following:    B Natriuretic Peptide 304.1 (*)    All other components within normal limits  CBC  PROTIME-INR  TROPONIN I  HEPARIN LEVEL (UNFRACTIONATED)  TSH  MAGNESIUM  CBC  COMPREHENSIVE METABOLIC PANEL  TROPONIN I  TROPONIN I  I-STAT TROPOININ, ED    EKG  EKG Interpretation  Date/Time:  Tuesday July 16 2016 13:04:22 EDT Ventricular Rate:  116 PR Interval:    QRS Duration: 103 QT Interval:  369 QTC Calculation: 513 R Axis:   -47 Text Interpretation:  Atrial fibrillation Ventricular premature complex LAD, consider left anterior fascicular block Abnormal R-wave progression, late transition Prolonged QT interval Baseline wander in lead(s) I V2 V4 V6 Confirmed by Rogene Houston  MD, SCOTT [phone]) on 07/16/2016 1:22:06  PM       Radiology Dg Chest 2 View  Result Date: 07/16/2016 CLINICAL DATA:  Heart attack 3 years ago. Increasing shortness of breath and hard time getting a deep breath starting about 3 days ago. EXAM: CHEST  2 VIEW COMPARISON:  02/03/2012 FINDINGS: Low volume film. Cardiopericardial silhouette is at upper limits of normal for size. There is pulmonary vascular congestion without overt pulmonary edema. No focal airspace consolidation or pleural effusion. Probable small hiatal hernia. The visualized bony structures of the thorax are intact. Telemetry leads overlie the chest. IMPRESSION: Borderline cardiomegaly with vascular congestion. Electronically Signed   By: Misty Stanley M.D.   On: 07/16/2016 13:53    Procedures Procedures (including critical care time)  Medications Ordered in ED Medications  heparin ADULT infusion 100 units/mL (25000 units/227mL sodium chloride 0.45%) (1,300 Units/hr Intravenous New Bag/Given 07/16/16 1752)  atorvastatin (LIPITOR) tablet 40 mg (not administered)  fenofibrate tablet 160 mg (not administered)  metoprolol succinate (TOPROL-XL) 24 hr tablet 100 mg (not administered)  nitroGLYCERIN (NITROSTAT) SL tablet 0.4 mg (not administered)  aspirin EC tablet 81 mg (not administered)  pantoprazole (PROTONIX) EC tablet 40 mg (not administered)  ferrous sulfate tablet 325 mg (not administered)  sodium chloride flush (NS) 0.9 % injection 3 mL (3 mLs Intravenous Not Given 07/16/16 2200)  sodium chloride flush (NS) 0.9 % injection 3 mL (3 mLs Intravenous Not Given 07/16/16 2200)  sodium chloride flush (NS) 0.9 % injection 3 mL (not administered)  0.9 %  sodium chloride infusion (not administered)  acetaminophen (TYLENOL) tablet 650 mg (not administered)    Or  acetaminophen (TYLENOL) suppository 650 mg (not administered)  ondansetron (ZOFRAN) tablet 4 mg (not administered)    Or  ondansetron (ZOFRAN) injection 4 mg (not administered)  diltiazem (CARDIZEM) 100 mg in  dextrose 5% 12mL (1 mg/mL) infusion (7.5 mg/hr Intravenous Transfusing/Transfer 07/16/16 1743)  diphenhydrAMINE (BENADRYL) capsule 25 mg (25 mg Oral Given 07/16/16 2200)    And  acetaminophen (TYLENOL) tablet 500 mg (500 mg Oral Given 07/16/16 2200)  clopidogrel (PLAVIX) tablet 75 mg (75 mg Oral Given  07/16/16 2144)  furosemide (LASIX) injection 40 mg (40 mg Intravenous Not Given 07/16/16 2000)  diltiazem (CARDIZEM) 100 mg in dextrose 5 % 100 mL (1 mg/mL) infusion (7.5 mg/hr Intravenous Rate/Dose Change 07/16/16 1604)  heparin bolus via infusion 4,000 Units (4,000 Units Intravenous Bolus from Bag 07/16/16 1754)  furosemide (LASIX) injection 40 mg (40 mg Intravenous Given 07/16/16 1755)     Initial Impression / Assessment and Plan / ED Course  I have reviewed the triage vital signs and the nursing notes.  Pertinent labs & imaging results that were available during my care of the patient were reviewed by me and considered in my medical decision making (see chart for details).  Clinical Course    Patient here for progressive dyspnea on exertion and shortness of breath that is new over the last several days. He is noted to be in A. fib with RVR in the ED.  duration of A. fib is unknown but likely to be longer than 48 hours. He was started on diltiazem drip for rate control with hospitalist and cardiology consultation for further management.  Pt updated of findings of studies and agrees with admission.    Final Clinical Impressions(s) / ED Diagnoses   Final diagnoses:  New onset a-fib [facility])    New Prescriptions Current Discharge Medication List       Quintella Reichert, MD 07/17/16 [phone]

## 2016-07-17 ENCOUNTER — Telehealth: Payer: Self-pay | Admitting: Cardiology

## 2016-07-17 ENCOUNTER — Inpatient Hospital Stay (HOSPITAL_COMMUNITY): Payer: Medicare Other

## 2016-07-17 ENCOUNTER — Encounter (HOSPITAL_COMMUNITY): Payer: Self-pay | Admitting: Student

## 2016-07-17 DIAGNOSIS — I4891 Unspecified atrial fibrillation: Secondary | ICD-10-CM

## 2016-07-17 DIAGNOSIS — I5031 Acute diastolic (congestive) heart failure: Secondary | ICD-10-CM

## 2016-07-17 LAB — COMPREHENSIVE METABOLIC PANEL
ALK PHOS: 38 U/L (ref 38–126)
ALT: 22 U/L (ref 17–63)
AST: 20 U/L (ref 15–41)
Albumin: 3.7 g/dL (ref 3.5–5.0)
Anion gap: 8 (ref 5–15)
BUN: 30 mg/dL — AB (ref 6–20)
CALCIUM: 8.8 mg/dL — AB (ref 8.9–10.3)
CHLORIDE: 106 mmol/L (ref 101–111)
CO2: 27 mmol/L (ref 22–32)
CREATININE: 1.56 mg/dL — AB (ref 0.61–1.24)
GFR calc Af Amer: 50 mL/min — ABNORMAL LOW (ref >60)
GFR calc non Af Amer: 43 mL/min — ABNORMAL LOW (ref >60)
GLUCOSE: 94 mg/dL (ref 65–99)
Potassium: 3.5 mmol/L (ref 3.5–5.1)
SODIUM: 141 mmol/L (ref 135–145)
Total Bilirubin: 0.6 mg/dL (ref 0.3–1.2)
Total Protein: 6.6 g/dL (ref 6.5–8.1)

## 2016-07-17 LAB — CBC
HCT: 40.8 % (ref 39.0–52.0)
HEMOGLOBIN: 13.4 g/dL (ref 13.0–17.0)
MCH: 30.7 pg (ref 26.0–34.0)
MCHC: 32.8 g/dL (ref 30.0–36.0)
MCV: 93.6 fL (ref 78.0–100.0)
PLATELETS: 174 10*3/uL (ref 150–400)
RBC: 4.36 MIL/uL (ref 4.22–5.81)
RDW: 13.4 % (ref 11.5–15.5)
WBC: 7 10*3/uL (ref 4.0–10.5)

## 2016-07-17 LAB — ECHOCARDIOGRAM COMPLETE
HEIGHTINCHES: 67 in
WEIGHTICAEL: 3712 [oz_av]

## 2016-07-17 LAB — TSH: TSH: 2.524 u[IU]/mL (ref 0.350–4.500)

## 2016-07-17 LAB — TROPONIN I
Troponin I: 0.03 ng/mL (ref <0.03)
Troponin I: <0.03 ng/mL (ref <0.03)

## 2016-07-17 LAB — HEPARIN LEVEL (UNFRACTIONATED)
Heparin Unfractionated: 0.15 IU/mL — ABNORMAL LOW (ref 0.30–0.70)
Heparin Unfractionated: 0.25 IU/mL — ABNORMAL LOW (ref 0.30–0.70)

## 2016-07-17 LAB — MAGNESIUM: Magnesium: 1.8 mg/dL (ref 1.7–2.4)

## 2016-07-17 MED ORDER — RIVAROXABAN 20 MG PO TABS: 20.0000 mg | ORAL_TABLET | Freq: Every day | ORAL | 0 refills | Status: DC

## 2016-07-17 MED ORDER — DILTIAZEM HCL ER COATED BEADS 180 MG PO CP24: 180.0000 mg | ORAL_CAPSULE | Freq: Every day | ORAL | 0 refills | Status: DC

## 2016-07-17 MED ORDER — FUROSEMIDE 20 MG PO TABS: 20.0000 mg | ORAL_TABLET | Freq: Every day | ORAL | 0 refills | Status: DC

## 2016-07-17 MED ORDER — PERFLUTREN LIPID MICROSPHERE
INTRAVENOUS | Status: AC
  Filled 2016-07-17: qty 10

## 2016-07-17 MED ORDER — DILTIAZEM HCL ER COATED BEADS 180 MG PO CP24
180.0000 mg | ORAL_CAPSULE | Freq: Every day | ORAL | Status: DC
  Administered 2016-07-17: 180 mg via ORAL
  Filled 2016-07-17: qty 1

## 2016-07-17 MED ORDER — PERFLUTREN LIPID MICROSPHERE
1.0000 mL | INTRAVENOUS | Status: AC | PRN
  Administered 2016-07-17: 2 mL via INTRAVENOUS
  Filled 2016-07-17: qty 10

## 2016-07-17 MED ORDER — RIVAROXABAN 20 MG PO TABS: 20.0000 mg | ORAL_TABLET | Freq: Every day | ORAL | Status: DC

## 2016-07-17 MED ORDER — APIXABAN 5 MG PO TABS
5.0000 mg | ORAL_TABLET | Freq: Two times a day (BID) | ORAL | Status: DC
  Administered 2016-07-17: 5 mg via ORAL
  Filled 2016-07-17: qty 1

## 2016-07-17 NOTE — Progress Notes (Addendum)
BENEFITS CHECK ON ELIQUIS: Eliquis is not on insurance formulary and must be pre-authorized before a copay amount can be determined; if you wish to call for pre-authorization, call [phone].  HOWEVER, Pradaxa and Xarelto is on insurance formulary coverage and the copay for each of these medications is $42.00. Spoke with cardiologist (on unit) who instructs me to give free 30 day trial card for Xarelto; CM gave pt free 30 day card and pt has verbalized understanding this card will pay for discharge prescription for Xarelto and give insurance time to authorize for refills.  No other CM needs were communicated.

## 2016-07-17 NOTE — Telephone Encounter (Signed)
New message     TOC appt on  9.14.2017 with laura ingold. Per [place]

## 2016-07-17 NOTE — Progress Notes (Signed)
Hospital Problem List     Active Problems:   New onset a-fib [facility])   Atrial fibrillation ([place])   Acute diastolic congestive heart failure [facility])     Patient Profile:   Primary Cardiologist: [name]  70 y.o. male w/ PMH of CAD (s/p PCI to LAD in 2006 with bifurcation lesion and Plavix indefinitely was recommended), HTN, and dyslipidemia who presented to [facility] ED on 07/16/2016 for worsening dyspnea. Diagnosed with new onset atrial fibrillation.  Subjective   Denies any chest discomfort or palpitations. Ambulating around the room this AM with no symptoms, HR peaking in low-100's.   Inpatient Medications    . diphenhydrAMINE  25 mg Oral QHS   And  . acetaminophen  500 mg Oral QHS  . aspirin EC  81 mg Oral Daily  . atorvastatin  40 mg Oral q morning - 10a  . clopidogrel  75 mg Oral Daily  . fenofibrate  160 mg Oral Daily  . ferrous sulfate  325 mg Oral q morning - 10a  . furosemide  40 mg Intravenous Q12H  . metoprolol succinate  100 mg Oral q morning - 10a  . pantoprazole  40 mg Oral Daily  . sodium chloride flush  3 mL Intravenous Q12H    Vital Signs    Vitals:   07/16/16 1542 07/16/16 2000 07/16/16 2054 07/17/16 0509  BP: 96/77 (!) 117/92 (!) 148/100 111/63  Pulse: 98 63 77 87  Resp: 25 (!) 22 (!) 24   Temp:  98.5 F (36.9 C) 98.4 F (36.9 C) 98.1 F (36.7 C)  TempSrc:  Oral Oral Oral  SpO2: 96% 100% 98% 97%  Weight:      Height:        Intake/Output Summary (Last 24 hours) at 07/17/16 [id number] Last data filed at 07/17/16 0700  Gross per 24 hour  Intake           530.21 ml  Output             2025 ml  Net         -1494.79 ml   Filed Weights   07/16/16 1310  Weight: 232 lb (105.2 kg)    Physical Exam    General: Well developed, well nourished, male appearing in no acute distress. Head: Normocephalic, atraumatic.  Neck: Supple without bruits, JVD not elevated. Lungs:  Resp regular and unlabored, CTA without wheezing or rales. Heart: Irregularly  irregular, S1, S2, no S3, S4, or murmur; no rub. Abdomen: Soft, non-tender, non-distended with normoactive bowel sounds. No hepatomegaly. No rebound/guarding. No obvious abdominal masses. Extremities: No clubbing, cyanosis, or edema. Distal pedal pulses are 2+ bilaterally. Neuro: Alert and oriented X 3. Moves all extremities spontaneously. Psych: Normal affect.  Labs    CBC  Recent Labs  07/16/16 1332 07/17/16 0528  WBC 8.2 7.0  HGB 14.8 13.4  HCT 44.7 40.8  MCV 95.3 93.6  PLT 218 [id number]   Basic Metabolic Panel  Recent Labs  07/16/16 1332 07/17/16 0528  NA 140 141  K 3.5 3.5  CL 107 106  CO2 24 27  GLUCOSE 93 94  BUN 29* 30*  CREATININE 1.49* 1.56*  CALCIUM 9.4 8.8*  MG  --  1.8   Liver Function Tests  Recent Labs  07/17/16 0528  AST 20  ALT 22  ALKPHOS 38  BILITOT 0.6  PROT 6.6  ALBUMIN 3.7   No results for input(s): LIPASE, AMYLASE in the last 72 hours. Cardiac Enzymes  Recent Labs  07/16/16 2100 07/17/16 0109 07/17/16 0528  TROPONINI <0.03 0.03* <0.03   Thyroid Function Tests  Recent Labs  07/17/16 0528  TSH 2.524    Telemetry    Atrial fibrillation, HR in 70's - 90's, peaking into 110's. Pauses up to 2.3 seconds noted.   ECG    No new tracings.    Cardiac Studies and Radiology    Dg Chest 2 View  Result Date: 07/16/2016 CLINICAL DATA:  Heart attack 3 years ago. Increasing shortness of breath and hard time getting a deep breath starting about 3 days ago. EXAM: CHEST  2 VIEW COMPARISON:  02/03/2012 FINDINGS: Low volume film. Cardiopericardial silhouette is at upper limits of normal for size. There is pulmonary vascular congestion without overt pulmonary edema. No focal airspace consolidation or pleural effusion. Probable small hiatal hernia. The visualized bony structures of the thorax are intact. Telemetry leads overlie the chest. IMPRESSION: Borderline cardiomegaly with vascular congestion. Electronically Signed   By: Misty Stanley M.D.    On: 07/16/2016 13:53    Echocardiogram: Pending  Assessment & Plan    1. New Onset Atrial Fibrillation - reports worsening dyspnea with exertion since last Thursday (07/11/2016), now occurring at rest. Denies any chest discomfort or palpitations. No history of atrial fibrillation.  - labs show a WBC of 8.2, Hgb 14.8, and platelets 218. BMET with K+ 3.5 and creatinine 1.49 (variable between 1.2 - 1.6 in the past 4 years). Initial troponin negative. EKG shows new-onset atrial fibrillation with HR of 116 and PVC's. CXR with borderline cardiomegaly and vascular congestion. - Mg and TSH WNL. Cyclic troponin values have been flat at 0.03, 0.03, and 0.03. Echo is pending  to assess LV function and wall motion. - This patients CHA2DS2-VASc Score and unadjusted Ischemic Stroke Rate (% per year) is equal to 3.2 % stroke rate/year from a score of 3 (HTN, Vascular, Age). Currently on Heparin. Would switch to Eliquis 5mg  BID following echo (making sure EF is not significantly reduced which would require a cath).   - started on IV Cardizem for rate-control. HR is 70's - 90's, peaking into low-100's. Will switch to Cardizem CD 180mg  daily. Will need to switch back to Toprol-XL if EF is significantly reduced. Encouraged patient to ambulate after receiving PO dosing to monitor his symptoms and HR. - will arrange for hospital follow-up and plan for DCCV in 4 weeks after being on NOAC if he continues to remain in atrial fibrillation.    2. Acute on Chronic Diastolic CHF - echo in 0000000 showed preserved EF of 55-60%. BNP elevated to 304 on admission. Started on IV Lasix with a recorded net output of -1.4L thus far. Continue to obtain daily weights and strict I&O's.   3. CAD - s/p PCI to LAD in 2006 with bifurcation lesion and Plavix indefinitely was recommended. Anginal symptoms at that time included exertional dyspnea and jaw pain. Denies any jaw discomfort at this time. Last NST in 12/2012 without evidence of  ischemia.  - will obtain repeat echocardiogram to evaluate LV function and wall motion.  - continue ASA, Plavix, and statin. Will d/c ASA due to indication for long-term anticoagulation and the need to avoid triple therapy.   4. HLD - continue statin therapy.  5. HTN - started on IV Cardizem while in the ED. Would hold Amlodipine, Prinzide, and Toprol-XL on admission. Will discontinue Amlodipine due to being on Cardizem.    Signed, Erma Heritage , PA-C 8:21 AM 07/17/2016  Pager: [phone]  I have personally seen and examined this patient with Bernerd Pho, PA-C. I agree with the assessment and plan as outlined above. He is feeling better this am. Echo pending. HR controlled. Will change IV Cardizem to po Cardizem. Will change heparin to Eliquis 5 mg po BID if LV function is normal. Volume status is better post Lasix. He may need low dose Lasix at discharge (Lasix 20 mg daily).  If echo is normal, can d/c home today with above plan. Follow up in our office in one week. Anti-coagulation for one month and then DCCV if he does not convert to sinus before then.   Lauree Chandler 07/17/2016 9:44 AM

## 2016-07-17 NOTE — Discharge Summary (Signed)
Physician Discharge Summary  Peter [name] [id number] DOB: 14-Apr-1946 DOA: 07/16/2016  PCP: [name], MD  Admit date: 07/16/2016 Discharge date: 07/17/2016  Recommendations for Outpatient Follow-up:  1. Pt will need to follow up with PCP in 1-2 weeks post discharge 2. Please obtain BMP to evaluate electrolytes and kidney function 3. Please also check CBC to evaluate Hg and Hct levels 4. Pt started on Xarelto for atrial fibrillation per cardiology team recommendations  5. Please note that Norvasc and Metoprolol have been discontinued per cardiology team recommendations   Discharge Diagnoses:  Active Problems:   Acute diastolic congestive heart failure [facility])  Discharge Condition: Stable  Diet recommendation: Heart healthy diet discussed in details   History of present illness:  69 y.o. male with past medical history of CAD (s/p PCI to LAD in 2006 with bifurcation lesion and Plavix indefinitely was recommended), HTN, and dyslipidemia who presents to [facility] ED on 07/16/2016 for worsening dyspnea. Patient reported ever since last Thursday (07/11/2016) he has noted worsening dyspnea with exertion.  While in the ED, EKG shows new-onset atrial fibrillation with HR of 116 and PVC's. CXR with borderline cardiomegaly and vascular congestion.   Hospital Course:  1. New Onset Atrial Fibrillation - EKG shows new-onset atrial fibrillation with HR of 116 and PVC's. CXR with vascular congestion. - Mg and TSH WNL.  - CHA2DS2-VASc Score 3, pt started on Xarelto  - pt also placed on Cardizem PO  - cardiology to arrange for hospital follow-up and plan for DCCV in 4 weeks after being on NOAC if he continues to remain in atrial fibrillation.    2. Acute on Chronic Diastolic CHF - echo in 0000000 showed preserved EF of 55-60%. BNP elevated to 304 on admission.  - Started on IV Lasix and changed to PO Lasix upon discharge   3. CAD - s/p PCI to LAD in 2006 with bifurcation lesion and Plavix  indefinitely was recommended. Anginal symptoms at that time included exertional dyspnea and jaw pain. Denies any jaw discomfort at this time. Last NST in 12/2012 without evidence of ischemia.  - cardiology d/c ASA due to indication for long-term anticoagulation and the need to avoid triple therapy (pt also on Plavix)  4. HLD - continue statin therapy.  5. HTN, essential  - on cardizem, lasix, lisinopril - HCTX - metoprolol and norvasc discontinued per cardiology    Procedures/Studies: Dg Chest 2 View  Result Date: 07/16/2016 CLINICAL DATA:  Heart attack 3 years ago. Increasing shortness of breath and hard time getting a deep breath starting about 3 days ago. EXAM: CHEST  2 VIEW COMPARISON:  02/03/2012 FINDINGS: Low volume film. Cardiopericardial silhouette is at upper limits of normal for size. There is pulmonary vascular congestion without overt pulmonary edema. No focal airspace consolidation or pleural effusion. Probable small hiatal hernia. The visualized bony structures of the thorax are intact. Telemetry leads overlie the chest. IMPRESSION: Borderline cardiomegaly with vascular congestion. Electronically Signed   By: Misty Stanley M.D.   On: 07/16/2016 13:53   Discharge Exam: Vitals:   07/17/16 1001 07/17/16 1412  BP: 109/82 123/82  Pulse: 88 79  Resp: (!) 24 (!) 24  Temp: 99.1 F (37.3 C) 98.2 F (36.8 C)   Vitals:   07/16/16 2054 07/17/16 0509 07/17/16 1001 07/17/16 1412  BP: (!) 148/100 111/63 109/82 123/82  Pulse: 77 87 88 79  Resp: (!) 24  (!) 24 (!) 24  Temp: 98.4 F (36.9 C) 98.1 F (36.7 C) 99.1 F (  37.3 C) 98.2 F (36.8 C)  TempSrc: Oral Oral Oral Oral  SpO2: 98% 97% 95% 94%  Weight:      Height:        General: Pt is alert, follows commands appropriately, not in acute distress Cardiovascular: Irregular rate and rhythm, no rubs, no gallops Respiratory: Clear to auscultation bilaterally, no wheezing, no crackles, no rhonchi Abdominal: Soft, non tender,  non distended, bowel sounds +, no guarding  Discharge Instructions  Discharge Instructions    Diet - low sodium heart healthy    Complete by:  As directed   Increase activity slowly    Complete by:  As directed       Medication List    STOP taking these medications   amLODipine 10 MG tablet Commonly known as:  NORVASC   aspirin EC 81 MG tablet   methylPREDNISolone 4 MG Tbpk tablet Commonly known as:  MEDROL DOSEPAK   metoprolol succinate 100 MG 24 hr tablet Commonly known as:  TOPROL-XL     TAKE these medications   atorvastatin 40 MG tablet Commonly known as:  LIPITOR Take 1 tablet (40 mg total) by mouth every morning.   clopidogrel 75 MG tablet Commonly known as:  PLAVIX TK 1 T PO QD   diltiazem 180 MG 24 hr capsule Commonly known as:  CARDIZEM CD Take 1 capsule (180 mg total) by mouth daily.   diphenhydramine-acetaminophen 25-500 MG Tabs tablet Commonly known as:  TYLENOL PM Take 1 tablet by mouth at bedtime.   fenofibrate 145 MG tablet Commonly known as:  TRICOR Take 145 mg by mouth daily.   ferrous sulfate 325 (65 FE) MG tablet Take 325 mg by mouth every morning.   furosemide 20 MG tablet Commonly known as:  LASIX Take 1 tablet (20 mg total) by mouth daily.   lisinopril-hydrochlorothiazide 20-12.5 MG tablet Commonly known as:  PRINZIDE,ZESTORETIC Take 1 tablet by mouth daily.   nitroGLYCERIN 0.4 MG SL tablet Commonly known as:  NITROSTAT Place 1 tablet (0.4 mg total) under the tongue every 5 (five) minutes as needed for chest pain.   pantoprazole 40 MG tablet Commonly known as:  PROTONIX Take 40 mg by mouth daily.   rivaroxaban 20 MG Tabs tablet Commonly known as:  XARELTO Take 1 tablet (20 mg total) by mouth daily with supper.      Follow-up Information    Cecilie Kicks, NP Follow up on 07/25/2016.   Specialties:  Cardiology, Radiology Why:  Cardiology Hospital Follow-Up on 07/25/2016 at 2:30PM ([name] Office).  Contact  information: [address] [place] [address] [phone]        KOIRALA,[name], MD .   Specialty:  Family Medicine Contact information: [address] [place] [address] [phone]        Faye Ramsay, MD. Schedule an appointment as soon as possible for a visit today.   Specialty:  Internal Medicine Why:  call me with any questions [phone] Contact information: [address] [place] [address] [phone]            The results of significant diagnostics from this hospitalization (including imaging, microbiology, ancillary and laboratory) are listed below for reference.     Microbiology: No results found for this or any previous visit (from the past 240 hour(s)).   Labs: Basic Metabolic Panel:  Recent Labs Lab 07/16/16 1332 07/17/16 0528  NA 140 141  K 3.5 3.5  CL 107 106  CO2  24 27  GLUCOSE 93 94  BUN 29* 30*  CREATININE 1.49* 1.56*  CALCIUM 9.4 8.8*  MG  --  1.8   Liver Function Tests:  Recent Labs Lab 07/17/16 0528  AST 20  ALT 22  ALKPHOS 38  BILITOT 0.6  PROT 6.6  ALBUMIN 3.7   No results for input(s): LIPASE, AMYLASE in the last 168 hours. No results for input(s): AMMONIA in the last 168 hours. CBC:  Recent Labs Lab 07/16/16 1332 07/17/16 0528  WBC 8.2 7.0  HGB 14.8 13.4  HCT 44.7 40.8  MCV 95.3 93.6  PLT 218 174   Cardiac Enzymes:  Recent Labs Lab 07/16/16 2100 07/17/16 0109 07/17/16 0528  TROPONINI <0.03 0.03* <0.03   BNP: BNP (last 3 results)  Recent Labs  07/16/16 1332  BNP 304.1*    ProBNP (last 3 results) No results for input(s): PROBNP in the last 8760 hours.  CBG: No results for input(s): GLUCAP in the last 168 hours.   SIGNED: Time coordinating discharge: 30 minutes  Faye Ramsay, MD  [facility] 07/17/2016, 3:49 PM Pager [phone]  If 7PM-7AM, please contact night-coverage [email or web address] Password  [email or web address]

## 2016-07-17 NOTE — Progress Notes (Signed)
PO cardizem given at 1045. HR has remained AFib 80-90 range. IV cardizem stopped at 1145. Will continue to monitor. Callie Fielding RN

## 2016-07-17 NOTE — Progress Notes (Signed)
  Echocardiogram 2D Echocardiogram has been performed.  Tresa Res 07/17/2016, 11:11 AM

## 2016-07-17 NOTE — Discharge Instructions (Addendum)
Rivaroxaban oral tablets What is this medicine? RIVAROXABAN (ri va ROX a ban) is an anticoagulant (blood thinner). It is used to treat blood clots in the lungs or in the veins. It is also used after knee or hip surgeries to prevent blood clots. It is also used to lower the chance of stroke in people with a medical condition called atrial fibrillation. This medicine may be used for other purposes; ask your health care provider or pharmacist if you have questions. What should I tell my health care provider before I take this medicine? They need to know if you have any of these conditions: -bleeding disorders -bleeding in the brain -blood in your stools (black or tarry stools) or if you have blood in your vomit -history of stomach bleeding -kidney disease -liver disease -low blood counts, like low white cell, platelet, or red cell counts -recent or planned spinal or epidural procedure -take medicines that treat or prevent blood clots -an unusual or allergic reaction to rivaroxaban, other medicines, foods, dyes, or preservatives -pregnant or trying to get pregnant -breast-feeding How should I use this medicine? Take this medicine by mouth with a glass of water. Follow the directions on the prescription label. Take your medicine at regular intervals. Do not take it more often than directed. Do not stop taking except on your doctor's advice. Stopping this medicine may increase your risk of a blood clot. Be sure to refill your prescription before you run out of medicine. If you are taking this medicine after hip or knee replacement surgery, take it with or without food. If you are taking this medicine for atrial fibrillation, take it with your evening meal. If you are taking this medicine to treat blood clots, take it with food at the same time each day. If you are unable to swallow your tablet, you may crush the tablet and mix it in applesauce. Then, immediately eat the applesauce. You should eat more  food right after you eat the applesauce containing the crushed tablet. Talk to your pediatrician regarding the use of this medicine in children. Special care may be needed. Overdosage: If you think you have taken too much of this medicine contact a poison control center or emergency room at once. NOTE: This medicine is only for you. Do not share this medicine with others. What if I miss a dose? If you take your medicine once a day and miss a dose, take the missed dose as soon as you remember. If you take your medicine twice a day and miss a dose, take the missed dose immediately. In this instance, 2 tablets may be taken at the same time. The next day you should take 1 tablet twice a day as directed. What may interact with this medicine? -aspirin and aspirin-like medicines -certain antibiotics like erythromycin, azithromycin, and clarithromycin -certain medicines for fungal infections like ketoconazole and itraconazole -certain medicines for irregular heart beat like amiodarone, quinidine, dronedarone -certain medicines for seizures like carbamazepine, phenytoin -certain medicines that treat or prevent blood clots like warfarin, enoxaparin, and dalteparin -conivaptan -diltiazem -felodipine -indinavir -lopinavir; ritonavir -NSAIDS, medicines for pain and inflammation, like ibuprofen or naproxen -ranolazine -rifampin -ritonavir -St. John's wort -verapamil This list may not describe all possible interactions. Give your health care provider a list of all the medicines, herbs, non-prescription drugs, or dietary supplements you use. Also tell them if you smoke, drink alcohol, or use illegal drugs. Some items may interact with your medicine. What should I watch for while using this   medicine? Visit your doctor or health care professional for regular checks on your progress. Your condition will be monitored carefully while you are receiving this medicine. Notify your doctor or health care  professional and seek emergency treatment if you develop breathing problems; changes in vision; chest pain; severe, sudden headache; pain, swelling, warmth in the leg; trouble speaking; sudden numbness or weakness of the face, arm, or leg. These can be signs that your condition has gotten worse. If you are going to have surgery, tell your doctor or health care professional that you are taking this medicine. Tell your health care professional that you use this medicine before you have a spinal or epidural procedure. Sometimes people who take this medicine have bleeding problems around the spine when they have a spinal or epidural procedure. This bleeding is very rare. If you have a spinal or epidural procedure while on this medicine, call your health care professional immediately if you have back pain, numbness or tingling (especially in your legs and feet), muscle weakness, paralysis, or loss of bladder or bowel control. Avoid sports and activities that might cause injury while you are using this medicine. Severe falls or injuries can cause unseen bleeding. Be careful when using sharp tools or knives. Consider using an electric razor. Take special care brushing or flossing your teeth. Report any injuries, bruising, or red spots on the skin to your doctor or health care professional. What side effects may I notice from receiving this medicine? Side effects that you should report to your doctor or health care professional as soon as possible: -allergic reactions like skin rash, itching or hives, swelling of the face, lips, or tongue -back pain -redness, blistering, peeling or loosening of the skin, including inside the mouth -signs and symptoms of bleeding such as bloody or black, tarry stools; red or dark-brown urine; spitting up blood or brown material that looks like coffee grounds; red spots on the skin; unusual bruising or bleeding from the eye, gums, or nose Side effects that usually do not require  medical attention (Report these to your doctor or health care professional if they continue or are bothersome.): -dizziness -muscle pain This list may not describe all possible side effects. Call your doctor for medical advice about side effects. You may report side effects to FDA at 1-800-FDA-1088. Where should I keep my medicine? Keep out of the reach of children. Store at room temperature between 15 and 30 degrees C (59 and 86 degrees F). Throw away any unused medicine after the expiration date. NOTE: This sheet is a summary. It may not cover all possible information. If you have questions about this medicine, talk to your doctor, pharmacist, or health care provider.    2016, Elsevier/Gold Standard. (2014-10-26 12:45:34)  

## 2016-07-17 NOTE — Progress Notes (Signed)
ANTICOAGULATION CONSULT NOTE - Follow Up Consult  Pharmacy Consult for Heparin Indication: atrial fibrillation  No Known Allergies  Patient Measurements: Height: 5\' 7"  (170.2 cm) Weight: 232 lb (105.2 kg) IBW/kg (Calculated) : 66.1 Heparin Dosing Weight:   Vital Signs: Temp: 98.4 F (36.9 C) (09/05 2054) Temp Source: Oral (09/05 2054) BP: 148/100 (09/05 2054) Pulse Rate: 77 (09/05 2054)  Labs:  Recent Labs  07/16/16 1332 07/16/16 2100 07/17/16 0109  HGB 14.8  --   --   HCT 44.7  --   --   PLT 218  --   --   LABPROT 13.6  --   --   INR 1.04  --   --   HEPARINUNFRC  --   --  0.15*  CREATININE 1.49*  --   --   TROPONINI  --  <0.03 0.03*    Estimated Creatinine Clearance: 52.5 mL/min (by C-G formula based on SCr of 1.49 mg/dL).   Medications:  Infusions:  . diltiazem (CARDIZEM) infusion    . heparin 1,300 Units/hr (07/16/16 1752)    Assessment: Patient with low heparin level.  Per RN, the diltiazem line was leaking at one point but not the heparin.  No heparin issues per RN.  Goal of Therapy:  Heparin level 0.3-0.7 units/ml Monitor platelets by anticoagulation protocol: Yes   Plan:  Increase heparin to 1450 units/hr Recheck level at [address], [place] 07/17/2016,2:47 AM

## 2016-07-17 NOTE — Progress Notes (Signed)
CRITICAL VALUE ALERT  Critical value received:  Troponin 0.03  Date of notification:  07/17/16   Time of notification:  0158  Critical value read back:Yes.    Nurse who received alert:  Sheffield Slider  MD notified (1st page):  K. Kirby-NP   Time of first page:  0201  MD notified (2nd page):  Time of second page:  Responding MD:  Baltazar Najjar   Time MD responded:  [id number]

## 2016-07-18 NOTE — Telephone Encounter (Signed)
Patient contacted regarding discharge from [name] on July 17, 2016.Peter [name]  Patient understands to follow up with provider Cecilie Kicks, NP on July 25, 2016 at 2:30pm at [facility]. Patient understands discharge instructions? yes Patient understands medications and regiment? yes Patient understands to bring all medications to this visit? yes

## 2016-07-23 ENCOUNTER — Encounter: Payer: Self-pay | Admitting: Cardiology

## 2016-07-25 ENCOUNTER — Other Ambulatory Visit: Payer: Self-pay | Admitting: Cardiology

## 2016-07-25 ENCOUNTER — Encounter: Payer: Self-pay | Admitting: Cardiology

## 2016-07-25 ENCOUNTER — Ambulatory Visit (INDEPENDENT_AMBULATORY_CARE_PROVIDER_SITE_OTHER): Payer: Medicare Other | Admitting: Cardiology

## 2016-07-25 VITALS — BP 120/60 | HR 74 | Ht 67.0 in | Wt 224.0 lb

## 2016-07-25 DIAGNOSIS — Z7901 Long term (current) use of anticoagulants: Secondary | ICD-10-CM | POA: Diagnosis not present

## 2016-07-25 DIAGNOSIS — I10 Essential (primary) hypertension: Secondary | ICD-10-CM

## 2016-07-25 DIAGNOSIS — R000 Tachycardia, unspecified: Secondary | ICD-10-CM

## 2016-07-25 DIAGNOSIS — I481 Persistent atrial fibrillation: Secondary | ICD-10-CM | POA: Diagnosis not present

## 2016-07-25 DIAGNOSIS — I4819 Other persistent atrial fibrillation: Secondary | ICD-10-CM

## 2016-07-25 DIAGNOSIS — I2510 Atherosclerotic heart disease of native coronary artery without angina pectoris: Secondary | ICD-10-CM | POA: Diagnosis not present

## 2016-07-25 MED ORDER — DILTIAZEM HCL ER COATED BEADS 240 MG PO CP24: 240.0000 mg | ORAL_CAPSULE | Freq: Every day | ORAL | 11 refills | Status: DC

## 2016-07-25 MED ORDER — DILTIAZEM HCL ER COATED BEADS 240 MG PO CP24: 240.0000 mg | ORAL_CAPSULE | Freq: Every day | ORAL | 3 refills | Status: DC

## 2016-07-25 NOTE — Patient Instructions (Signed)
Medication Instructions:  Your physician has recommended you make the following change in your medication:  1) INCREASE Cardizem to 240 mg / 1 capsule daily   Labwork: TODAY: BMET   Testing/Procedures: None Ordered   Follow-Up: Your physician recommends that you schedule a follow-up appointment in: 3-4 weeks with [name] or Cecilie Kicks, NP.   Any Other Special Instructions Will Be Listed Below (If Applicable).     If you need a refill on your cardiac medications before your next appointment, please call your pharmacy.

## 2016-07-25 NOTE — Progress Notes (Signed)
Cardiology Office Note   Date:  07/25/2016   ID:  Peter [name], DOB Nov 06, 1946, MRN ID:[id number]  PCP:  [name], MD  Cardiologist:  [name]    Chief Complaint  Patient presents with  . Atrial Fibrillation    SOB      History of Present Illness: Peter [name] is a 70 y.o. male who presents for post hospitalization for a fib.    Has coronary artery disease with PCI to the LAD in 2006, 11/05/2005 (Dr. [name]) complex bifurcation lesion in which Plavix use indefinite was recommended. Ejection fraction is 55-60%. A nuclear stress test in 2014 that showed no ischemia, normal EF.  Was admitted to [facility] with increased dyspnea.  Was found to be in a fib.  Some vascular congestion.  - This patients CHA2DS2-VASc Score and unadjusted Ischemic Stroke Rate (% per year) is equal to 3.2 % stroke rate/year from a score of 3(HTN, Vascular, Age).    Echo:  - Study Conclusions  - Left ventricle: The cavity size was normal. There was mild focal   basal hypertrophy of the septum. Systolic function was normal.   The estimated ejection fraction was in the range of 55% to 60%.   Wall motion was normal; there were no regional wall motion   abnormalities. The study was not technically sufficient to allow   evaluation of LV diastolic dysfunction due to atrial   fibrillation. - Mitral valve: Calcified annulus. - Left atrium: The atrium was mildly dilated. - Pulmonary arteries: PA peak pressure: 31 mm Hg (S).  Pt placed on Xarelto and diuresed. Discharged with Lasix.  Also on dilt his BB was stopped.    Today overall he feels much better.  No chest pain and SOB only if bends to pet his dog.  Is able to walk without problems.  No bleeding with Xarelto.  He has not missed any xarelto and takes with BK which is a big meal for him.  Past Medical History:  Diagnosis Date  . At risk for sleep apnea    STOP-BANG= 5       SENT TO PCP 07-12-2015  . CAD (coronary artery disease)    a. s/p  PCI with DES x 3 to LAD in 2006 with bifurcation lesion and Plavix indefinitely was recommended.  . Chronic diastolic (congestive) heart failure ([place])    a. 2013: Echo showing preserved EF of 55-60%  . Dyslipidemia   . Dyspnea on exertion   . GERD (gastroesophageal reflux disease)   . History of GI bleed   . History of kidney stones   . History of MI (myocardial infarction)    11-05-2005--  periprocedural MI  (cardiac cath) per dr [name] note  . Hypertension   . Hypertriglyceridemia   . Iron deficiency anemia   . New onset atrial fibrillation (Peter [name])    a. 07/2016, started on Eliquis.  . Right ureteral stone   . S/P drug eluting coronary stent placement    x3  to LAD  2006    Past Surgical History:  Procedure Laterality Date  . CARDIOVASCULAR STRESS TEST  01-06-2013  dr [name]   normal perfusion study/  no ischemia or infarct/  normal LV function and wall motion , ef 57%  . CORONARY ANGIOPLASTY  11-13-2005 dr [name]   Successful touch up post-dilatation of the 3 tandem overlying Cypher stents in proximal to mid LAD (based on IVUS 0% stenosis and patent diagonal branch)/  Cutting Balloon Angioplasty of Ramus  branch  . CORONARY ANGIOPLASTY WITH STENT PLACEMENT  11-05-2005   dr [name]   PCI and DES x3 to birfurcation lesion LAD and diagonal branch of LAD/   20% dLM,  30-40% RCA, ef 60%  . CYSTOSCOPY WITH RETROGRADE PYELOGRAM, URETEROSCOPY AND STENT PLACEMENT Right 07/14/2015   Procedure: CYSTOSCOPY WITH RETROGRADE PYELOGRAM, URETEROSCOPY AND STENT PLACEMENT;  Surgeon: Alexis Frock, MD;  Location: [facility];  Service: Urology;  Laterality: Right;  . EXTRACORPOREAL SHOCK WAVE LITHOTRIPSY  yrs ago  . LEFT URETEROSCOPIC STONE EXTRACTION  04-10-2006  . STONE EXTRACTION WITH BASKET Right 07/14/2015   Procedure: STONE EXTRACTION WITH BASKET;  Surgeon: Alexis Frock, MD;  Location: [facility];  Service: Urology;  Laterality: Right;  . TRANSTHORACIC  ECHOCARDIOGRAM  02-04-2012   grade I diastolic dysfunction/  ef 55-60%/  trivial MR and TR/  mild LAE     Current Outpatient Prescriptions  Medication Sig Dispense Refill  . atorvastatin (LIPITOR) 40 MG tablet Take 1 tablet (40 mg total) by mouth every morning. 90 tablet 3  . clopidogrel (PLAVIX) 75 MG tablet Take 75 mg by mouth daily.    Marland Kitchen diltiazem (CARDIZEM CD) 180 MG 24 hr capsule Take 1 capsule (180 mg total) by mouth daily. 30 capsule 0  . diphenhydramine-acetaminophen (TYLENOL PM) 25-500 MG TABS tablet Take 1 tablet by mouth at bedtime.    . fenofibrate (TRICOR) 145 MG tablet Take 145 mg by mouth daily.    . ferrous sulfate 325 (65 FE) MG tablet Take 325 mg by mouth every morning.     . furosemide (LASIX) 20 MG tablet Take 1 tablet (20 mg total) by mouth daily. 30 tablet 0  . lisinopril-hydrochlorothiazide (PRINZIDE,ZESTORETIC) 20-12.5 MG tablet Take 1 tablet by mouth daily.    . nitroGLYCERIN (NITROSTAT) 0.4 MG SL tablet Place 1 tablet (0.4 mg total) under the tongue every 5 (five) minutes as needed for chest pain. 25 tablet prn  . pantoprazole (PROTONIX) 40 MG tablet Take 40 mg by mouth daily.     . rivaroxaban (XARELTO) 20 MG TABS tablet Take 1 tablet (20 mg total) by mouth daily with supper. 30 tablet 0   No current facility-administered medications for this visit.     Allergies:   Review of patient's allergies indicates no known allergies.    Social History:  The patient  reports that he has never smoked. He has never used smokeless tobacco. He reports that he does not drink alcohol or use drugs.   Family History:  The patient's family history includes Heart attack (age of onset: 68) in his father.    ROS:  General:no colds or fevers, no weight changes Skin:no rashes or ulcers HEENT:no blurred vision, no congestion CV:see HPI PUL:see HPI GI:no diarrhea constipation or melena, no indigestion GU:no hematuria, no dysuria MS:no joint pain, no claudication Neuro:no  syncope, no lightheadedness Endo:no diabetes, no thyroid disease  Wt Readings from Last 3 Encounters:  07/25/16 224 lb (101.6 kg)  07/16/16 232 lb (105.2 kg)  02/16/16 232 lb 6.4 oz (105.4 kg)     PHYSICAL EXAM: VS:  BP 120/60   Pulse 74   Ht 5\' 7"  (1.702 m)   Wt 224 lb (101.6 kg)   BMI 35.08 kg/m  , BMI Body mass index is 35.08 kg/m. General:Pleasant affect, NAD Skin:Warm and dry, brisk capillary refill HEENT:normocephalic, sclera clear, mucus membranes moist Neck:supple, no JVD, no bruits  Heart:S1S2 RRR without murmur, gallup, rub or click Lungs:clear without  rales, rhonchi, or wheezes [id number], non tender, + BS, do not palpate liver spleen or masses Ext:no lower ext edema, 2+ pedal pulses, 2+ radial pulses Neuro:alert and oriented, MAE, follows commands, + facial symmetry    EKG:  EKG is ordered today. The ekg ordered today demonstrates a fib with RVR  LAFB no acute changes otherwise   Recent Labs: 07/16/2016: B Natriuretic Peptide 304.1 07/17/2016: ALT 22; BUN 30; Creatinine, Ser 1.56; Hemoglobin 13.4; Magnesium 1.8; Platelets 174; Potassium 3.5; Sodium 141; TSH 2.524    Lipid Panel    Component Value Date/Time   CHOL 140 01/01/2013 1037   TRIG 230.0 (H) 01/01/2013 1037   HDL 38.20 (L) 01/01/2013 1037   CHOLHDL 4 01/01/2013 1037   VLDL 46.0 (H) 01/01/2013 1037   LDLDIRECT 68.6 01/01/2013 1037       Other studies Reviewed: Additional studies/ records that were reviewed today include: Echo see above.    ASSESSMENT AND PLAN:  1.  A fib, new, rate still rapid will increase dilt to 240 mg daily.  He will monitor BP. Continue xarelto.  Plan to see back in 3 weeks and plan for DCCV if a fib continues and he does not miss any Xarelto doses.  2.  CHF diastolic and a fib with RVR , improved, continue Lasix for now.  + wt loss with diuresis during hospitalization  3. CAD no chest pain and troponins were flat during hospitalization.  Continues with plavix but ASA was  stopped.  With PCI in 2006 due to complex bifurcation lesion in which Plavix use indefinite was recommended/     Current medicines are reviewed with the patient today.  The patient Has no concerns regarding medicines.  The following changes have been made:  See above Labs/ tests ordered today include:see above  Disposition:   FU:  see above  Signed, Cecilie Kicks, NP  07/25/2016 3:14 PM    [address], [place], [place], [place] Phone: [phone]; Fax: [phone]

## 2016-07-26 ENCOUNTER — Telehealth: Payer: Self-pay | Admitting: Cardiology

## 2016-07-26 LAB — BASIC METABOLIC PANEL
BUN: 29 mg/dL — ABNORMAL HIGH (ref 7–25)
CO2: 26 mmol/L (ref 20–31)
Calcium: 10 mg/dL (ref 8.6–10.3)
Chloride: 105 mmol/L (ref 98–110)
Creat: 1.89 mg/dL — ABNORMAL HIGH (ref 0.70–1.18)
Glucose, Bld: 76 mg/dL (ref 65–99)
POTASSIUM: 4 mmol/L (ref 3.5–5.3)
Sodium: 142 mmol/L (ref 135–146)

## 2016-07-26 NOTE — Telephone Encounter (Signed)
Agree with need for evaluation. I would hold both Xarelto and Plavix. When reinitiating, I would likely use Xarelto only.  Candee Furbish, MD

## 2016-07-26 NOTE — Telephone Encounter (Signed)
Pt states he has an internal hemorrhoid that becomes external when he has a bowel movement.  Pt states he developed a 'little case" of diarrhea last night, this is not unusual for him. Pt states since midnight last night he has had 5 loose stools.  Pt states he has had noticed blood on tissue  with each stool that he feels is related to irritation to his hemorrhoid.  Pt states he feels that once his loose stools have resolved that rectal bleeding will resolve, Pt is concerned about the amount of blood on tissue with each loose stool.   Pt denies any other symptoms including lightheadedness or dizziness, pt states he is eating and able to drink fluids, he is staying hydrated.   Pt states he can check his BP. Pt asked me to call him back in 30 minutes to give him a chance to check his BP. Pt advised in the meantime I will forward this message to Mickel Baas and Dr Marlou Porch for review.

## 2016-07-26 NOTE — Telephone Encounter (Signed)
I reviewed with Mickel Baas--  Per Mickel Baas: Pt should have further evaluation of GI bleed to be sure nothing else going on.                   Pt should see GI or PCP today, if unable to see GI or PCP should report to ED for further evaluation of GI bleed.  Pt states he does not have GI but will call PCP to see if PCP can see him today. Pt advised if PCP cannot see today he should report to ED today for further evaluation. Pt advised not to drive.  Pt agreed with plan.  Pt reports BP 115/81 and heart rate 74.

## 2016-07-26 NOTE — Telephone Encounter (Signed)
Pt states he has already taken both Xarelto and Plavix this morning.

## 2016-07-26 NOTE — Telephone Encounter (Signed)
I spoke to him on phone. He has not noticed any further spotting of blood on his tissue. Likely hemorrhoidal bleeding. His blood pressure is 0000000 systolic. Pulses in the 80s. He seems to be doing well. I've instructed him to hold both his Plavix and Xarelto over the weekend and call us on Monday. If bleeding ceases, I would like for him to restart the Xarelto only on Monday. The combination of both Xarelto on Plavix may be too strong for him and may result in further bleeding. We went over the rationale for both Plavix and Xarelto given his previous bifurcation stent in 2006 however at this point the risk of both therapies seems to outweigh the potential benefit.  If he has any further excessive bleeding he knows to seek medical attention.  Candee Furbish, MD

## 2016-07-26 NOTE — Telephone Encounter (Signed)
See other phone note dated 07/26/16

## 2016-07-26 NOTE — Telephone Encounter (Signed)
New message      Pt c/o medication issue:  1. Name of Medication: plavix and xarelto 2. How are you currently taking this medication (dosage and times per day)?  3. Are you having a reaction (difficulty breathing--STAT)? no 4. What is your medication issue? Pt started having rectal bleeding last night.  Not sure if it is from the medication or coming from hemmorhoids.  Just want to talk to someone

## 2016-07-29 ENCOUNTER — Telehealth: Payer: Self-pay | Admitting: *Deleted

## 2016-07-29 MED ORDER — LISINOPRIL 10 MG PO TABS: 10.0000 mg | ORAL_TABLET | Freq: Every day | ORAL | 0 refills | Status: DC

## 2016-07-29 NOTE — Telephone Encounter (Signed)
Spoke with pt re: d/c lisinopril-hctz and starting Lisinopril 10 mg qd.  Pt verbalized understanding.

## 2016-07-29 NOTE — Telephone Encounter (Signed)
Thanks you!

## 2016-07-29 NOTE — Telephone Encounter (Signed)
Pt called back to let me know that he forgot to mention during our conversation earlier, that yesterday, 07/28/16 from about 12-6 he could not get a deep breath in.  He said once he sneezed, he finally got a deep breath in and felt fine.  He also mentioned that he had some chest tightness during the time, but when he sneezed it all went away.  I asked the pt if he checked his bp and hr when this episode was going on since he has recently been dx with afib, and pt said bp was 117/74 and hr 98.  Pt was offered sooner appt to come in and be seen for symptoms, but pt stated he would monitor his symptoms and if it happened again, he would call or go to er if after hours.  I did advise pt that I would let [name] and Cecilie Kicks, NP know that he called.  Pt verbalized understanding and appreciation.

## 2016-07-29 NOTE — Telephone Encounter (Signed)
-----   Message from Isaiah Serge, NP sent at 07/26/2016  2:48 PM EDT ----- Change the lisinopril/hctz to lisinopril 10 mg only.  This will help kidney function and give more BP,  But once Cardioverted may go back to previous meds so don't trash them.  thanks

## 2016-08-02 ENCOUNTER — Telehealth: Payer: Self-pay | Admitting: Cardiology

## 2016-08-02 NOTE — Telephone Encounter (Signed)
Reviewed information with Dr Caryl Comes who advised pt either be set up for an outpatient TEE guided cardioversion for Monday or to go to the hospital today for admission.  Called back to speak with pt who is now reporting he is feeling much better within the last few mins.  States he was able to get a real deep breath and is now feeling fine.  He was advised of the options however at this point he has decided to hold off on scheduling the cardioversion or going to the ED.  He will continue to monitor his HR/BP and take medications as listed.  If SOB returns he will attempt the things known to him that cause him to return to NSR, call the MD on call if the office is closed or report to the ED for admission and treat.  For now he will keep his appt as scheduled.  Will forward to Dr Marlou Porch for his knowledge.

## 2016-08-02 NOTE — Telephone Encounter (Signed)
Spoke with pt who is experiencing increased shortness of breath since 4 am today.  HR 106 BP 135/106 at 11:30 am.  HX of At Fib and is taking Cardizem 240 mg a day (took at 8 am today) Is also on Xarelto and Lisinopril 10 mg a day.  Other than SOB he does not have any other s/s but states he has never been able to tell when he is in At Fib except if having increased SOB.  Advised I will review with MD/APP and call him back with further instructions.  He states understanding.

## 2016-08-02 NOTE — Telephone Encounter (Signed)
New Message  Pt c/o Shortness Of Breath: STAT if SOB developed within the last 24 hours or pt is noticeably SOB on the phone  1. Are you currently SOB (can you hear that pt is SOB on the phone)? Yes  2. How long have you been experiencing SOB? Since 4 am  3. Are you SOB when sitting or when up moving around? Both  4. Are you currently experiencing any other symptoms? No

## 2016-08-03 ENCOUNTER — Emergency Department (HOSPITAL_COMMUNITY): Payer: Medicare Other

## 2016-08-03 ENCOUNTER — Inpatient Hospital Stay (HOSPITAL_COMMUNITY)
Admission: EM | Admit: 2016-08-03 | Discharge: 2016-08-09 | DRG: 308 | Disposition: A | Discharge status: Home / Self Care | Payer: Medicare Other | Attending: Internal Medicine | Admitting: Internal Medicine

## 2016-08-03 ENCOUNTER — Encounter (HOSPITAL_COMMUNITY): Payer: Self-pay

## 2016-08-03 DIAGNOSIS — N183 Chronic kidney disease, stage 3 unspecified: Secondary | ICD-10-CM | POA: Diagnosis present

## 2016-08-03 DIAGNOSIS — Z87442 Personal history of urinary calculi: Secondary | ICD-10-CM

## 2016-08-03 DIAGNOSIS — E669 Obesity, unspecified: Secondary | ICD-10-CM | POA: Diagnosis present

## 2016-08-03 DIAGNOSIS — I10 Essential (primary) hypertension: Secondary | ICD-10-CM | POA: Diagnosis present

## 2016-08-03 DIAGNOSIS — I481 Persistent atrial fibrillation: Secondary | ICD-10-CM | POA: Diagnosis not present

## 2016-08-03 DIAGNOSIS — T502X5A Adverse effect of carbonic-anhydrase inhibitors, benzothiadiazides and other diuretics, initial encounter: Secondary | ICD-10-CM | POA: Diagnosis present

## 2016-08-03 DIAGNOSIS — E876 Hypokalemia: Secondary | ICD-10-CM | POA: Diagnosis present

## 2016-08-03 DIAGNOSIS — I5033 Acute on chronic diastolic (congestive) heart failure: Secondary | ICD-10-CM | POA: Diagnosis present

## 2016-08-03 DIAGNOSIS — I509 Heart failure, unspecified: Secondary | ICD-10-CM | POA: Diagnosis not present

## 2016-08-03 DIAGNOSIS — Z7902 Long term (current) use of antithrombotics/antiplatelets: Secondary | ICD-10-CM | POA: Diagnosis not present

## 2016-08-03 DIAGNOSIS — I4891 Unspecified atrial fibrillation: Secondary | ICD-10-CM | POA: Diagnosis not present

## 2016-08-03 DIAGNOSIS — I4819 Other persistent atrial fibrillation: Secondary | ICD-10-CM | POA: Diagnosis present

## 2016-08-03 DIAGNOSIS — R0609 Other forms of dyspnea: Secondary | ICD-10-CM | POA: Diagnosis not present

## 2016-08-03 DIAGNOSIS — Z7901 Long term (current) use of anticoagulants: Secondary | ICD-10-CM | POA: Diagnosis not present

## 2016-08-03 DIAGNOSIS — I5031 Acute diastolic (congestive) heart failure: Secondary | ICD-10-CM | POA: Diagnosis not present

## 2016-08-03 DIAGNOSIS — E781 Pure hyperglyceridemia: Secondary | ICD-10-CM | POA: Diagnosis present

## 2016-08-03 DIAGNOSIS — I428 Other cardiomyopathies: Secondary | ICD-10-CM | POA: Diagnosis not present

## 2016-08-03 DIAGNOSIS — Z6833 Body mass index (BMI) 33.0-33.9, adult: Secondary | ICD-10-CM

## 2016-08-03 DIAGNOSIS — I252 Old myocardial infarction: Secondary | ICD-10-CM

## 2016-08-03 DIAGNOSIS — I129 Hypertensive chronic kidney disease with stage 1 through stage 4 chronic kidney disease, or unspecified chronic kidney disease: Secondary | ICD-10-CM | POA: Diagnosis not present

## 2016-08-03 DIAGNOSIS — D509 Iron deficiency anemia, unspecified: Secondary | ICD-10-CM | POA: Diagnosis present

## 2016-08-03 DIAGNOSIS — I2583 Coronary atherosclerosis due to lipid rich plaque: Secondary | ICD-10-CM | POA: Diagnosis present

## 2016-08-03 DIAGNOSIS — I2510 Atherosclerotic heart disease of native coronary artery without angina pectoris: Secondary | ICD-10-CM | POA: Diagnosis present

## 2016-08-03 DIAGNOSIS — Z955 Presence of coronary angioplasty implant and graft: Secondary | ICD-10-CM | POA: Diagnosis not present

## 2016-08-03 DIAGNOSIS — I130 Hypertensive heart and chronic kidney disease with heart failure and stage 1 through stage 4 chronic kidney disease, or unspecified chronic kidney disease: Secondary | ICD-10-CM | POA: Diagnosis not present

## 2016-08-03 DIAGNOSIS — I5021 Acute systolic (congestive) heart failure: Secondary | ICD-10-CM | POA: Diagnosis present

## 2016-08-03 DIAGNOSIS — R0602 Shortness of breath: Secondary | ICD-10-CM | POA: Diagnosis not present

## 2016-08-03 DIAGNOSIS — E785 Hyperlipidemia, unspecified: Secondary | ICD-10-CM | POA: Diagnosis not present

## 2016-08-03 DIAGNOSIS — K219 Gastro-esophageal reflux disease without esophagitis: Secondary | ICD-10-CM | POA: Diagnosis present

## 2016-08-03 DIAGNOSIS — R0600 Dyspnea, unspecified: Secondary | ICD-10-CM

## 2016-08-03 DIAGNOSIS — R000 Tachycardia, unspecified: Secondary | ICD-10-CM | POA: Diagnosis not present

## 2016-08-03 DIAGNOSIS — N182 Chronic kidney disease, stage 2 (mild): Secondary | ICD-10-CM | POA: Diagnosis not present

## 2016-08-03 DIAGNOSIS — I499 Cardiac arrhythmia, unspecified: Secondary | ICD-10-CM | POA: Diagnosis not present

## 2016-08-03 HISTORY — DX: Other persistent atrial fibrillation: I48.19

## 2016-08-03 HISTORY — DX: Chronic kidney disease, stage 3 unspecified: N18.30

## 2016-08-03 HISTORY — DX: Chronic kidney disease, stage 3 (moderate): N18.3

## 2016-08-03 HISTORY — DX: Chronic combined systolic (congestive) and diastolic (congestive) heart failure: I50.42

## 2016-08-03 LAB — CBC
HEMATOCRIT: 46.2 % (ref 39.0–52.0)
Hemoglobin: 15.1 g/dL (ref 13.0–17.0)
MCH: 32 pg (ref 26.0–34.0)
MCHC: 32.7 g/dL (ref 30.0–36.0)
MCV: 97.9 fL (ref 78.0–100.0)
PLATELETS: 218 10*3/uL (ref 150–400)
RBC: 4.72 MIL/uL (ref 4.22–5.81)
RDW: 13.2 % (ref 11.5–15.5)
WBC: 8.3 10*3/uL (ref 4.0–10.5)

## 2016-08-03 LAB — MRSA PCR SCREENING: MRSA BY PCR: NEGATIVE

## 2016-08-03 LAB — BASIC METABOLIC PANEL
Anion gap: 10 (ref 5–15)
BUN: 22 mg/dL — AB (ref 6–20)
CHLORIDE: 108 mmol/L (ref 101–111)
CO2: 23 mmol/L (ref 22–32)
CREATININE: 1.47 mg/dL — AB (ref 0.61–1.24)
Calcium: 9.5 mg/dL (ref 8.9–10.3)
GFR calc Af Amer: 54 mL/min — ABNORMAL LOW (ref >60)
GFR calc non Af Amer: 46 mL/min — ABNORMAL LOW (ref >60)
GLUCOSE: 105 mg/dL — AB (ref 65–99)
Potassium: 3.7 mmol/L (ref 3.5–5.1)
SODIUM: 141 mmol/L (ref 135–145)

## 2016-08-03 LAB — PROTIME-INR
INR: 2.94
Prothrombin Time: 31.3 seconds — ABNORMAL HIGH (ref 11.4–15.2)

## 2016-08-03 LAB — I-STAT TROPONIN, ED: TROPONIN I, POC: 0.02 ng/mL (ref 0.00–0.08)

## 2016-08-03 LAB — TSH: TSH: 0.871 u[IU]/mL (ref 0.350–4.500)

## 2016-08-03 LAB — MAGNESIUM: Magnesium: 1.9 mg/dL (ref 1.7–2.4)

## 2016-08-03 LAB — BRAIN NATRIURETIC PEPTIDE: B NATRIURETIC PEPTIDE 5: 414.8 pg/mL — AB (ref 0.0–100.0)

## 2016-08-03 MED ORDER — RIVAROXABAN 20 MG PO TABS
20.0000 mg | ORAL_TABLET | Freq: Every day | ORAL | Status: DC
  Administered 2016-08-04: 20 mg via ORAL
  Filled 2016-08-03: qty 1

## 2016-08-03 MED ORDER — DILTIAZEM HCL-DEXTROSE 100-5 MG/100ML-% IV SOLN (PREMIX)
5.0000 mg/h | INTRAVENOUS | Status: DC
  Administered 2016-08-03: 5 mg/h via INTRAVENOUS
  Administered 2016-08-03: 10 mg/h via INTRAVENOUS
  Administered 2016-08-04 – 2016-08-09 (×6): 5 mg/h via INTRAVENOUS
  Filled 2016-08-03 (×9): qty 100

## 2016-08-03 MED ORDER — PANTOPRAZOLE SODIUM 40 MG PO TBEC
40.0000 mg | DELAYED_RELEASE_TABLET | Freq: Every day | ORAL | Status: DC
  Administered 2016-08-04 – 2016-08-09 (×6): 40 mg via ORAL
  Filled 2016-08-03 (×6): qty 1

## 2016-08-03 MED ORDER — FUROSEMIDE 20 MG PO TABS: 20.0000 mg | ORAL_TABLET | Freq: Every day | ORAL | Status: DC

## 2016-08-03 MED ORDER — SODIUM CHLORIDE 0.9% FLUSH
3.0000 mL | Freq: Two times a day (BID) | INTRAVENOUS | Status: DC
  Administered 2016-08-05 – 2016-08-09 (×2): 3 mL via INTRAVENOUS

## 2016-08-03 MED ORDER — BISACODYL 5 MG PO TBEC: 5.0000 mg | DELAYED_RELEASE_TABLET | Freq: Every day | ORAL | Status: DC | PRN

## 2016-08-03 MED ORDER — CLOPIDOGREL BISULFATE 75 MG PO TABS
75.0000 mg | ORAL_TABLET | Freq: Every day | ORAL | Status: DC
  Administered 2016-08-04 – 2016-08-09 (×6): 75 mg via ORAL
  Filled 2016-08-03 (×6): qty 1

## 2016-08-03 MED ORDER — ACETAMINOPHEN 500 MG PO TABS
500.0000 mg | ORAL_TABLET | Freq: Every evening | ORAL | Status: DC | PRN
  Administered 2016-08-04 – 2016-08-08 (×6): 500 mg via ORAL
  Filled 2016-08-03 (×6): qty 1

## 2016-08-03 MED ORDER — FENOFIBRATE 160 MG PO TABS
160.0000 mg | ORAL_TABLET | Freq: Every day | ORAL | Status: DC
  Administered 2016-08-04 – 2016-08-09 (×6): 160 mg via ORAL
  Filled 2016-08-03 (×6): qty 1

## 2016-08-03 MED ORDER — DIPHENHYDRAMINE HCL 25 MG PO CAPS
25.0000 mg | ORAL_CAPSULE | Freq: Every evening | ORAL | Status: DC | PRN
  Administered 2016-08-04 – 2016-08-08 (×6): 25 mg via ORAL
  Filled 2016-08-03 (×6): qty 1

## 2016-08-03 MED ORDER — FUROSEMIDE 10 MG/ML IJ SOLN
40.0000 mg | Freq: Two times a day (BID) | INTRAMUSCULAR | Status: DC
  Administered 2016-08-03 – 2016-08-04 (×3): 40 mg via INTRAVENOUS
  Filled 2016-08-03 (×4): qty 4

## 2016-08-03 MED ORDER — LISINOPRIL 10 MG PO TABS
10.0000 mg | ORAL_TABLET | Freq: Every day | ORAL | Status: DC
  Administered 2016-08-04 – 2016-08-09 (×6): 10 mg via ORAL
  Filled 2016-08-03 (×6): qty 1

## 2016-08-03 MED ORDER — FAMOTIDINE 20 MG PO TABS
20.0000 mg | ORAL_TABLET | Freq: Every day | ORAL | Status: DC
  Administered 2016-08-05 – 2016-08-09 (×4): 20 mg via ORAL
  Filled 2016-08-03 (×7): qty 1

## 2016-08-03 MED ORDER — ATORVASTATIN CALCIUM 40 MG PO TABS
40.0000 mg | ORAL_TABLET | Freq: Every morning | ORAL | Status: DC
  Administered 2016-08-04 – 2016-08-09 (×6): 40 mg via ORAL
  Filled 2016-08-03 (×6): qty 1

## 2016-08-03 MED ORDER — FERROUS SULFATE 325 (65 FE) MG PO TABS
325.0000 mg | ORAL_TABLET | Freq: Every morning | ORAL | Status: DC
  Administered 2016-08-04 – 2016-08-09 (×6): 325 mg via ORAL
  Filled 2016-08-03 (×6): qty 1

## 2016-08-03 MED ORDER — ONDANSETRON HCL 4 MG PO TABS: 4.0000 mg | ORAL_TABLET | Freq: Four times a day (QID) | ORAL | Status: DC | PRN

## 2016-08-03 MED ORDER — ONDANSETRON HCL 4 MG/2ML IJ SOLN: 4.0000 mg | Freq: Four times a day (QID) | INTRAMUSCULAR | Status: DC | PRN

## 2016-08-03 MED ORDER — DILTIAZEM LOAD VIA INFUSION
10.0000 mg | Freq: Once | INTRAVENOUS | Status: AC
  Administered 2016-08-03: 10 mg via INTRAVENOUS
  Filled 2016-08-03: qty 10

## 2016-08-03 MED ORDER — POLYETHYLENE GLYCOL 3350 17 G PO PACK: 17.0000 g | PACK | Freq: Every day | ORAL | Status: DC | PRN

## 2016-08-03 MED ORDER — HYDRALAZINE HCL 20 MG/ML IJ SOLN: 10.0000 mg | Freq: Three times a day (TID) | INTRAMUSCULAR | Status: DC | PRN

## 2016-08-03 MED ORDER — DILTIAZEM HCL 25 MG/5ML IV SOLN
10.0000 mg | Freq: Once | INTRAVENOUS | Status: AC
Start: 2016-08-03 — End: 2016-08-03
  Administered 2016-08-03: 10 mg via INTRAVENOUS
  Filled 2016-08-03: qty 5

## 2016-08-03 MED ORDER — DILTIAZEM HCL ER COATED BEADS 120 MG PO CP24: 240.0000 mg | ORAL_CAPSULE | Freq: Every day | ORAL | Status: DC

## 2016-08-03 MED ORDER — NITROGLYCERIN 0.4 MG SL SUBL: 0.4000 mg | SUBLINGUAL_TABLET | SUBLINGUAL | Status: DC | PRN

## 2016-08-03 NOTE — Consult Note (Signed)
Admit date: 08/03/2016 Referring Physician  Dr. Nehemiah Settle Primary Physician  Dr. Dorthy Cooler Primary Cardiologist  [name] Reason for Consultation  afib with RVR and SOB  HPI: This is a 70 y.o. male with a history of ASCAD s/p PCI of the LAD x 3, chronic diastolic heart failure, dyslipidmeia, persistent atrial fibrillation and hypertension. Patient was hospitalized overnight earlier this month for new onset atrial fibrillation / acute on chronic diastolic heart failure. Patient was started on Xarelto as well as Cardizem. Seen for outpatient follow-up in cardiology office 07/25/16, noted to have rapid rate but heart rate was in the 80's on vitals and Cardizem increased to 240 mg daily. Plan was to follow-up in 3 weeks and plan for DCCV if needed. His weight was 224 pounds at that visit. Patient tries to follow a low-sodium diet, he does not weigh himself daily. He is compliant with Lasix  Last Sunday patient had episode of shortness of breath lasting for several hours. No associated chest pain. The shortness of breath was not necessarily related to exertion, in fact patient had been painting outside. Shortness of breath most noticeable while lying in bed though patient was still able to sleep on 2 pillows as usual. Shortness of breath spontaneously resolved after several hours and patient was fine for the remainder of the week until yesterday when he developed similar symptoms of SOB most noticeable in supine position. No coughing but he does hear gurgling in his chest when lying flat.  Denies chest pain. In ER was in afib with RVR and started on IV cardizem gtt and now HR in low 100's.  Trop normal but BNP mildly elevated at 414 (up from 304 on 9/5).  Cxray with mild volume overload.  Cardiology is now asked to consult for help with treatment of afib and CHF.     PMH:   Past Medical History:  Diagnosis Date  . At risk for sleep apnea    STOP-BANG= 5       SENT TO PCP 07-12-2015  . CAD (coronary  artery disease)    a. s/p PCI with DES x 3 to LAD in 2006 with bifurcation lesion and Plavix indefinitely was recommended.  . Chronic diastolic (congestive) heart failure ([place])    a. 2013: Echo showing preserved EF of 55-60%  . Dyslipidemia   . Dyspnea on exertion   . GERD (gastroesophageal reflux disease)   . History of GI bleed   . History of kidney stones   . History of MI (myocardial infarction)    11-05-2005--  periprocedural MI  (cardiac cath) per dr [name] note  . Hypertension   . Hypertriglyceridemia   . Iron deficiency anemia   . New onset atrial fibrillation ([place])    a. 07/2016, started on Eliquis.  . Right ureteral stone   . S/P drug eluting coronary stent placement    x3  to LAD  2006     PSH:   Past Surgical History:  Procedure Laterality Date  . CARDIOVASCULAR STRESS TEST  01-06-2013  dr [name]   normal perfusion study/  no ischemia or infarct/  normal LV function and wall motion , ef 57%  . CORONARY ANGIOPLASTY  11-13-2005 dr [name]   Successful touch up post-dilatation of the 3 tandem overlying Cypher stents in proximal to mid LAD (based on IVUS 0% stenosis and patent diagonal branch)/  Cutting Balloon Angioplasty of Ramus branch  . CORONARY ANGIOPLASTY WITH STENT PLACEMENT  11-05-2005   dr Darnell Level  brodie   PCI and DES x3 to birfurcation lesion LAD and diagonal branch of LAD/   20% dLM,  30-40% RCA, ef 60%  . CYSTOSCOPY WITH RETROGRADE PYELOGRAM, URETEROSCOPY AND STENT PLACEMENT Right 07/14/2015   Procedure: CYSTOSCOPY WITH RETROGRADE PYELOGRAM, URETEROSCOPY AND STENT PLACEMENT;  Surgeon: Alexis Frock, MD;  [facility];  Service: Urology;  Laterality: Right;  . EXTRACORPOREAL SHOCK WAVE LITHOTRIPSY  yrs ago  . LEFT URETEROSCOPIC STONE EXTRACTION  04-10-2006  . STONE EXTRACTION WITH BASKET Right 07/14/2015   Procedure: STONE EXTRACTION WITH BASKET;  Surgeon: Alexis Frock, MD;  Location: [facility];  Service: Urology;  Laterality:  Right;  . TRANSTHORACIC ECHOCARDIOGRAM  02-04-2012   grade I diastolic dysfunction/  ef 55-60%/  trivial MR and TR/  mild LAE    Allergies:  Review of patient's allergies indicates no known allergies. Prior to Admit Meds:   (Not in a hospital admission) Fam HX:    Family History  Problem Relation Age of Onset  . Heart attack Father 10   Social HX:    Social History   Social History  . Marital status: Married    Spouse name: N/A  . Number of children: N/A  . Years of education: N/A   Occupational History  . Not on file.   Social History Main Topics  . Smoking status: Never Smoker  . Smokeless tobacco: Never Used  . Alcohol use No  . Drug use: No  . Sexual activity: Not on file   Other Topics Concern  . Not on file   Social History Narrative  . No narrative on file     ROS:  All 11 ROS were addressed and are negative except what is stated in the HPI  Physical Exam: Blood pressure 124/98, pulse 69, temperature 98.5 F (36.9 C), temperature source Oral, resp. rate 23, SpO2 99 %.    General: Well developed, well nourished, in no acute distress Head: Eyes PERRLA, No xanthomas.   Normal cephalic and atramatic  Lungs:   Clear bilaterally to auscultation and percussion. Heart:   Irregularly irregular and tachy S1 S2 Pulses are 2+ & equal.            No carotid bruit. No JVD.  No abdominal bruits. No femoral bruits. Abdomen: Bowel sounds are positive, abdomen soft and non-tender without masses  Msk:  Back normal, normal gait. Normal strength and tone for age. Extremities:   No clubbing, cyanosis.  2+ edema LLE.  DP +1 Neuro: Alert and oriented X 3. Psych:  Good affect, responds appropriately    Labs:   Lab Results  Component Value Date   WBC 8.3 08/03/2016   HGB 15.1 08/03/2016   HCT 46.2 08/03/2016   MCV 97.9 08/03/2016   PLT 218 08/03/2016    Recent Labs Lab 08/03/16 1103  NA 141  K 3.7  CL 108  CO2 23  BUN 22*  CREATININE 1.47*  CALCIUM 9.5    GLUCOSE 105*   No results found for: PTT Lab Results  Component Value Date   INR 2.94 08/03/2016   INR 1.04 07/16/2016   Lab Results  Component Value Date   CKTOTAL 71 02/04/2012   CKMB 2.1 02/04/2012   TROPONINI <0.03 07/17/2016     Lab Results  Component Value Date   CHOL 140 01/01/2013   Lab Results  Component Value Date   HDL 38.20 (L) 01/01/2013   No results found for: [facility] Lab Results  Component Value Date   TRIG 230.0 (H) 01/01/2013   Lab Results  Component Value Date   CHOLHDL 4 01/01/2013   Lab Results  Component Value Date   LDLDIRECT 68.6 01/01/2013      Radiology:  Dg Chest Port 1 View  Result Date: 08/03/2016 CLINICAL DATA:  AFib EXAM: PORTABLE CHEST 1 VIEW COMPARISON:  07/16/2016 FINDINGS: Increased interstitial markings. No frank interstitial edema. Mild left basilar opacity, possibly atelectasis. No pleural effusion or pneumothorax. Cardiomegaly. IMPRESSION: Increased interstitial markings without frank interstitial edema. Mild left basilar opacity, possibly atelectasis. Electronically Signed   By: Julian Hy M.D.   On: 08/03/2016 11:45    EKG:  Atrial fibrillation with RVR at 153bpm  ASSESSMENT/PLAN:   1.  Persistent afib now with RVR. He is very symptomatic when his rate gets high.  He has been on Xarelto for 2 weeks and has not missed any doses.  HR in 90's on IV cardizem but jumps to the 130's when getting up.  Will continue on IV Cardizem and titrate gtt as needed. D/C PO Cardizem.  Will make NPO after MN on Sunday and set up TEE/DCCV for Monday.  Continue Xarelto.   2.  SOB with elevated BNP and LE edema c/w acute diastolic CHF most likely for poorly controlled HR.  Stop PO and start IV lasix 40mg  IV BID and follow I&O's.  Creatinine improved.    3.  ASCAD s/p PCI of the LAD x 3 in 2006 - he has not had any chest pain. Continue Plavix - no ASA due to DOAC.   4. HTN - BP controlled on current meds. Continue ACE I.  5.  CKD stage 3.   Creatinine improved from last admission.  Fransico Him, MD  08/03/2016  4:39 PM

## 2016-08-03 NOTE — Telephone Encounter (Signed)
Reviewed. Thank you Candee Furbish, MD

## 2016-08-03 NOTE — ED Notes (Signed)
Abigail, PA at bedside 

## 2016-08-03 NOTE — ED Notes (Signed)
Family at bedside. 

## 2016-08-03 NOTE — ED Notes (Signed)
Pt placed on 2L [place]

## 2016-08-03 NOTE — ED Provider Notes (Signed)
[facility] DEPT Provider Note   CSN: [id number] Arrival date & time: 08/03/16  1050     History   Chief Complaint Chief Complaint  Patient presents with  . Atrial Fibrillation    HPI Peter [name] is a 70 y.o. male who presents with sxs of afib. he  has a past medical history of At risk for sleep apnea; CAD (coronary artery disease); Chronic diastolic (congestive) heart failure ([place]); Dyslipidemia; Dyspnea on exertion; GERD (gastroesophageal reflux disease); History of GI bleed; History of kidney stones; History of MI (myocardial infarction); Hypertension; Hypertriglyceridemia; Iron deficiency anemia; New onset atrial fibrillation ([place]); Right ureteral stone; and S/P drug eluting coronary stent placement.  The patient was admitted over Labor Day weekend for new onset atrial fibrillation. Patient states he has had 2 episodes of prolonged atrial fibrillation. One 2 weeks ago, lasting most the day, and a second. This past Sunday lasting for about 4 or 5 hours and self resolving. Patient states that yesterday around 4:30 AM he awoke with A. fib and shortness of breath. He states that he is trying to wait it out and did contact the office yesterday, however, was feeling better at that time and decided not to seek evaluation. Patient states it has progressively worsened. He feels extremely short of breath especially with exertion. He denies chest pain. He states he feels like he has fluid in his lungs. He has some swelling in the ankles, but states that has improved with Lasix. He took all of his medications this morning. He denies any recent alcohol abuse or stimulant use.   HPI  Past Medical History:  Diagnosis Date  . At risk for sleep apnea    STOP-BANG= 5       SENT TO PCP 07-12-2015  . CAD (coronary artery disease)    a. s/p PCI with DES x 3 to LAD in 2006 with bifurcation lesion and Plavix indefinitely was recommended.  . Chronic diastolic (congestive) heart failure ([place])    a.  2013: Echo showing preserved EF of 55-60%  . Dyslipidemia   . Dyspnea on exertion   . GERD (gastroesophageal reflux disease)   . History of GI bleed   . History of kidney stones   . History of MI (myocardial infarction)    11-05-2005--  periprocedural MI  (cardiac cath) per dr [name] note  . Hypertension   . Hypertriglyceridemia   . Iron deficiency anemia   . New onset atrial fibrillation ([place])    a. 07/2016, started on Eliquis.  . Right ureteral stone   . S/P drug eluting coronary stent placement    x3  to LAD  2006    Patient Active Problem List   Diagnosis Date Noted  . New onset a-fib ([place]) 07/16/2016  . Atrial fibrillation ([place]) 07/16/2016  . Acute diastolic congestive heart failure ([place])   . Ejection fraction   . Iron deficiency anemia due to chronic blood loss 02/04/2012  . Anemia 02/03/2012  . 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 DOE (dyspnea on exertion) 01/28/2012  . Hypertension   . CAD (coronary artery disease)   . Dyslipidemia   . GERD (gastroesophageal reflux disease)   . Overweight(278.02)     Past Surgical History:  Procedure Laterality Date  . CARDIOVASCULAR STRESS TEST  01-06-2013  dr [name]   normal perfusion study/  no ischemia or infarct/  normal LV function and wall motion , ef 57%  . CORONARY ANGIOPLASTY  11-13-2005 dr [name]  Successful touch up post-dilatation of the 3 tandem overlying Cypher stents in proximal to mid LAD (based on IVUS 0% stenosis and patent diagonal branch)/  Cutting Balloon Angioplasty of Ramus branch  . CORONARY ANGIOPLASTY WITH STENT PLACEMENT  11-05-2005   dr [name]   PCI and DES x3 to birfurcation lesion LAD and diagonal branch of LAD/   20% dLM,  30-40% RCA, ef 60%  . CYSTOSCOPY WITH RETROGRADE PYELOGRAM, URETEROSCOPY AND STENT PLACEMENT Right 07/14/2015   Procedure: CYSTOSCOPY WITH RETROGRADE PYELOGRAM, URETEROSCOPY AND STENT PLACEMENT;  Surgeon: Alexis Frock, MD;  Location: [facility];   Service: Urology;  Laterality: Right;  . EXTRACORPOREAL SHOCK WAVE LITHOTRIPSY  yrs ago  . LEFT URETEROSCOPIC STONE EXTRACTION  04-10-2006  . STONE EXTRACTION WITH BASKET Right 07/14/2015   Procedure: STONE EXTRACTION WITH BASKET;  Surgeon: Alexis Frock, MD;  Location[facility];  Service: Urology;  Laterality: Right;  . TRANSTHORACIC ECHOCARDIOGRAM  02-04-2012   grade I diastolic dysfunction/  ef 55-60%/  trivial MR and TR/  mild LAE       Home Medications    Prior to Admission medications   Medication Sig Start Date End Date Taking? Authorizing Provider  atorvastatin (LIPITOR) 40 MG tablet Take 1 tablet (40 mg total) by mouth every morning. 02/16/16   Jerline Pain, MD  clopidogrel (PLAVIX) 75 MG tablet Take 75 mg by mouth daily.    Historical Provider, MD  diltiazem (CARDIZEM CD) 240 MG 24 hr capsule Take 1 capsule (240 mg total) by mouth daily. 07/25/16   Isaiah Serge, NP  diltiazem (CARDIZEM CD) 240 MG 24 hr capsule Take 1 capsule (240 mg total) by mouth daily. 07/25/16 10/23/16  Isaiah Serge, NP  diphenhydramine-acetaminophen (TYLENOL PM) 25-500 MG TABS tablet Take 1 tablet by mouth at bedtime.    Historical Provider, MD  fenofibrate (TRICOR) 145 MG tablet Take 145 mg by mouth daily.    Historical Provider, MD  ferrous sulfate 325 (65 FE) MG tablet Take 325 mg by mouth every morning.     Historical Provider, MD  furosemide (LASIX) 20 MG tablet Take 1 tablet (20 mg total) by mouth daily. 07/17/16   Theodis Blaze, MD  lisinopril (PRINIVIL,ZESTRIL) 10 MG tablet Take 1 tablet (10 mg total) by mouth daily. 07/29/16 10/27/16  Isaiah Serge, NP  nitroGLYCERIN (NITROSTAT) 0.4 MG SL tablet Place 1 tablet (0.4 mg total) under the tongue every 5 (five) minutes as needed for chest pain. 02/16/16   Jerline Pain, MD  pantoprazole (PROTONIX) 40 MG tablet Take 40 mg by mouth daily.     Historical Provider, MD  rivaroxaban (XARELTO) 20 MG TABS tablet Take 1 tablet (20 mg total) by mouth  daily with supper. 07/17/16   Theodis Blaze, MD    Family History Family History  Problem Relation Age of Onset  . Heart attack Father 62    Social History Social History  Substance Use Topics  . Smoking status: Never Smoker  . Smokeless tobacco: Never Used  . Alcohol use No     Allergies   Review of patient's allergies indicates no known allergies.   Review of Systems Review of Systems  Ten systems reviewed and are negative for acute change, except as noted in the HPI.   Physical Exam Updated Vital Signs There were no vitals taken for this visit.  Physical Exam  Constitutional: He appears well-developed and well-nourished. No distress.  HENT:  Head: Normocephalic  and atraumatic.  Eyes: Conjunctivae are normal. No scleral icterus.  Neck: Normal range of motion. Neck supple.  Cardiovascular: Normal rate, regular rhythm and normal heart sounds.   BL 1+pitting edema of the feet and ankles  Pulmonary/Chest: No respiratory distress. He has rales.  Dyspneic but able to speak in full sentences  Abdominal: Soft. There is no tenderness.  Musculoskeletal: He exhibits no edema.  Neurological: He is alert.  Skin: Skin is warm and dry. He is not diaphoretic.  Psychiatric: His behavior is normal.  Nursing note and vitals reviewed.    ED Treatments / Results  Labs (all labs ordered are listed, but only abnormal results are displayed) Labs Reviewed - No data to display  EKG  EKG Interpretation None       Radiology No results found.  Procedures .Critical Care Performed by: Margarita Mail Authorized by: Margarita Mail   Critical care provider statement:    Critical care was necessary to treat or prevent imminent or life-threatening deterioration of the following conditions:  Cardiac failure   Critical care was time spent personally by me on the following activities:  Re-evaluation of patient's condition, review of old charts, pulse oximetry, ordering and review  of radiographic studies, ordering and review of laboratory studies, ordering and performing treatments and interventions, obtaining history from patient or surrogate, interpretation of cardiac output measurements, discussions with consultants, evaluation of patient's response to treatment and examination of patient   (including critical care time)  Medications Ordered in ED Medications - No data to display   Initial Impression / Assessment and Plan / ED Course  I have reviewed the triage vital signs and the nursing notes.  Pertinent labs & imaging results that were available during my care of the patient were reviewed by me and considered in my medical decision making (see chart for details).  Clinical Course  Comment By Time  Patient with Afib and rvr. Chadsvasc score is 3.  He has an elevated B and P as well as appears volume overloaded and I feel that although the chest x-ray denies frank pulmonary edema. He appears volume overloaded. The patient has been urinating frequently after he took his morning by mouth Lasix. Pulse is improved. Margarita Mail, PA-C 09/23 1208   Patient with history of A. fib and rapid ventricular response. Upon arrival with shortness of breath and lightheadedness. Patient given diltiazem with infusion and significant response to you. His rate control agent. Patient will be admitted to the hospitalist service. Pt feels improved after observation and/or treatment in ED. CHA2DS2/VAS Stroke Risk Points      3 >= 2 Points: High Risk  1 - 1.99 Points: Medium Risk  0 Points: Low Risk    The patient's score has not changed in the past year.:  No Change         Details    Note: External data might be a factor in metrics not marked with    Points Metrics   This score determines the patient's risk of having a stroke if the  patient has atrial fibrillation.       1 Has Congestive Heart Failure:  Yes   1 Has Vascular Disease:  Yes   1 Has Hypertension:  Yes   0 Age:   10   0 Has Diabetes:  No   0 Had Stroke:  No Had TIA:  No Had thromboembolism:  No   0 Male:  No  Final Clinical Impressions(s) / ED Diagnoses   Final diagnoses:  None    New Prescriptions New Prescriptions   No medications on file     Margarita Mail, PA-C 08/03/16 2102    Leo Grosser, MD 08/04/16 [phone]

## 2016-08-03 NOTE — H&P (Signed)
History and Physical    Peter [name] [id number] DOB: 04/05/1946 DOA: 08/03/2016  PCP: [name], MD  Patient coming from:   Home    Chief Complaint:  Shortness of breath HPI: Peter [name] is a 70 y.o. male with a medical history significant for, but not limited to coronary artery disease, chronic diastolic heart failure, and hypertension. Patient was hospitalized overnight earlier this month for new onset atrial fibrillation / acute on chronic diastolic heart failure. Patient was started on several toe as well as Cardizem. Seen for outpatient follow-up in cardiology office 07/25/16, still rapid rate and Cardizem increased to 240 mg daily. Plan was to follow-up in 3 weeks and plan for DCCV if needed. His weight was 224 pounds at that visit. Patient tries to follow a low-sodium diet, he does not weigh himself daily. He is compliant with Lasix  This past Sunday patient had episode of shortness of breath lasting for several hours. No associated chest pain. The shortness of breath was not necessarily related to exertion, in fact patient had been painting outside. Shortness of breath most noticeable while lying in bed though patient was still able to sleep on 2 pillows as usual. Shortness of breath spontaneously resolved after several hours and patient was fine for the remainder of the week until yesterday. He is short of breath again, most noticeable in supine position. No coughing but he does hear gurgling in his chest when lying flat.  Still no chest pain  ED Course:  Brought in by EMS for shortness of breath. Heart rate was 140, A. fib. BP 180/120, oxygen saturation 94% on room air. Respirations 30 .  In the emergency department patient is afebrile, heart rate initially 130-140s but now 116, RR 28, hypertensive with blood pressure of 142/104, oxygen saturations 93% on room air Point-of-care troponin 0.02, BNP 414 (up from 304 3 weeks ago),  Creatinine 1.47 Normal CBC Chest x-ray-  Increased interstitial markings without frank interstitial edema.. Mild left basilar opacity, possibly atelectasis. EKG shows A. fib with RVR, rate 153, borderline T-wave abnormalities in lateral leads, QTC 503  Review of Systems: As per HPI, otherwise 10 point review of systems negative.    Past Medical History:  Diagnosis Date  . At risk for sleep apnea    STOP-BANG= 5       SENT TO PCP 07-12-2015  . CAD (coronary artery disease)    a. s/p PCI with DES x 3 to LAD in 2006 with bifurcation lesion and Plavix indefinitely was recommended.  . Chronic diastolic (congestive) heart failure ([place])    a. 2013: Echo showing preserved EF of 55-60%  . Dyslipidemia   . Dyspnea on exertion   . GERD (gastroesophageal reflux disease)   . History of GI bleed   . History of kidney stones   . History of MI (myocardial infarction)    11-05-2005--  periprocedural MI  (cardiac cath) per dr [name] note  . Hypertension   . Hypertriglyceridemia   . Iron deficiency anemia   . New onset atrial fibrillation ([place])    a. 07/2016, started on Eliquis.  . Right ureteral stone   . S/P drug eluting coronary stent placement    x3  to LAD  2006    Past Surgical History:  Procedure Laterality Date  . CARDIOVASCULAR STRESS TEST  01-06-2013  dr [name]   normal perfusion study/  no ischemia or infarct/  normal LV function and wall motion , ef 57%  . CORONARY ANGIOPLASTY  11-13-2005 dr [name]   Successful touch up post-dilatation of the 3 tandem overlying Cypher stents in proximal to mid LAD (based on IVUS 0% stenosis and patent diagonal branch)/  Cutting Balloon Angioplasty of Ramus branch  . CORONARY ANGIOPLASTY WITH STENT PLACEMENT  11-05-2005   dr [name]   PCI and DES x3 to birfurcation lesion LAD and diagonal branch of LAD/   20% dLM,  30-40% RCA, ef 60%  . CYSTOSCOPY WITH RETROGRADE PYELOGRAM, URETEROSCOPY AND STENT PLACEMENT Right 07/14/2015   Procedure: CYSTOSCOPY WITH RETROGRADE PYELOGRAM, URETEROSCOPY AND  STENT PLACEMENT;  Surgeon: Alexis Frock, MD;  Location: [facility];  Service: Urology;  Laterality: Right;  . EXTRACORPOREAL SHOCK WAVE LITHOTRIPSY  yrs ago  . LEFT URETEROSCOPIC STONE EXTRACTION  04-10-2006  . STONE EXTRACTION WITH BASKET Right 07/14/2015   Procedure: STONE EXTRACTION WITH BASKET;  Surgeon: Alexis Frock, MD;  Location: [facility];  Service: Urology;  Laterality: Right;  . TRANSTHORACIC ECHOCARDIOGRAM  02-04-2012   grade I diastolic dysfunction/  ef 55-60%/  trivial MR and TR/  mild LAE    Social History   Social History  . Marital status: Married    Spouse name: N/A  . Number of children: N/A  . Years of education: N/A   Occupational History  . Not on file.   Social History Main Topics  . Smoking status: Never Smoker  . Smokeless tobacco: Never Used  . Alcohol use No  . Drug use: No  . Sexual activity: Not on file   Other Topics Concern  . Not on file   Social History Narrative  . No narrative on file    No Known AllergiesLives at home with wife. No assistive device as needed for ambulation  Family History  Problem Relation Age of Onset  . Heart attack Father 69    Prior to Admission medications   Medication Sig Start Date End Date Taking? Authorizing Provider  atorvastatin (LIPITOR) 40 MG tablet Take 1 tablet (40 mg total) by mouth every morning. 02/16/16  Yes Jerline Pain, MD  clopidogrel (PLAVIX) 75 MG tablet Take 75 mg by mouth daily.   Yes Historical Provider, MD  diltiazem (CARDIZEM CD) 240 MG 24 hr capsule Take 1 capsule (240 mg total) by mouth daily. 07/25/16  Yes Isaiah Serge, NP  diphenhydramine-acetaminophen (TYLENOL PM) 25-500 MG TABS tablet Take 1 tablet by mouth at bedtime.   Yes Historical Provider, MD  fenofibrate (TRICOR) 145 MG tablet Take 145 mg by mouth daily.   Yes Historical Provider, MD  ferrous sulfate 325 (65 FE) MG tablet Take 325 mg by mouth every morning.    Yes Historical Provider, MD    furosemide (LASIX) 20 MG tablet Take 1 tablet (20 mg total) by mouth daily. 07/17/16  Yes Theodis Blaze, MD  lisinopril (PRINIVIL,ZESTRIL) 10 MG tablet Take 1 tablet (10 mg total) by mouth daily. 07/29/16 10/27/16 Yes Isaiah Serge, NP  nitroGLYCERIN (NITROSTAT) 0.4 MG SL tablet Place 1 tablet (0.4 mg total) under the tongue every 5 (five) minutes as needed for chest pain. 02/16/16  Yes Jerline Pain, MD  pantoprazole (PROTONIX) 40 MG tablet Take 40 mg by mouth daily.    Yes Historical Provider, MD  rivaroxaban (XARELTO) 20 MG TABS tablet Take 1 tablet (20 mg total) by mouth daily with supper. Patient taking differently: Take 20 mg by mouth every morning.  07/17/16  Yes Theodis Blaze, MD    Physical Exam:  Vitals:   08/03/16 1130 08/03/16 1245 08/03/16 1300 08/03/16 1315  BP: (!) 141/124 151/98 141/91 (!) 142/104  Pulse: 63 114 80   Resp: (!) 28 (!) 27 25 (!) 27  Temp:      TempSrc:      SpO2: 96% 92% 93%     Constitutional:  Pleasant, obese white male in NAD, calm, comfortable Vitals:   08/03/16 1130 08/03/16 1245 08/03/16 1300 08/03/16 1315  BP: (!) 141/124 151/98 141/91 (!) 142/104  Pulse: 63 114 80   Resp: (!) 28 (!) 27 25 (!) 27  Temp:      TempSrc:      SpO2: 96% 92% 93%    Eyes: PER, lids and conjunctivae normal ENMT: Mucous membranes are moist. Posterior pharynx clear of any exudate or lesions..  Neck: normal, supple, no masses Respiratory: clear to auscultation bilaterally, a few bibasilar crackles. Normal respiratory effort. No accessory muscle use.  Cardiovascular:  Irregularly irregular rate and rhythm, 1+ BLE edema,  2+ dorsal pedis pulses.   Abdomen:  obese, no tenderness, no masses palpated. No hepatomegaly. Bowel sounds positive.  Musculoskeletal: no clubbing / cyanosis. No joint deformity upper and lower extremities. Good ROM, no contractures. Normal muscle tone.  Skin: no rashes, lesions, ulcers.  Neurologic: CN 2-12 grossly intact. Sensation intact, Strength 5/5 in  all 4.  Psychiatric: Normal judgment and insight. Alert and oriented x 3. Normal mood.    Labs on Admission: I have personally reviewed following labs and imaging studies   Radiological Exams on Admission: Dg Chest Port 1 View  Result Date: 08/03/2016 CLINICAL DATA:  AFib EXAM: PORTABLE CHEST 1 VIEW COMPARISON:  07/16/2016 FINDINGS: Increased interstitial markings. No frank interstitial edema. Mild left basilar opacity, possibly atelectasis. No pleural effusion or pneumothorax. Cardiomegaly. IMPRESSION: Increased interstitial markings without frank interstitial edema. Mild left basilar opacity, possibly atelectasis. Electronically Signed   By: Julian Hy M.D.   On: 08/03/2016 11:45    EKG: Independently reviewed.   EKG Interpretation  Date/Time:  Saturday August 03 2016 10:59:37 EDT Ventricular Rate:  153 PR Interval:    QRS Duration: 98 QT Interval:  317 QTC Calculation: 506 R Axis:   76 Text Interpretation:  Atrial fibrillation with rapid V-rate Borderline T abnormalities, lateral leads Since last tracing rate faster Otherwise no significant change Confirmed by KNOTT MD, DANIEL ([id number]) on 08/03/2016 11:01:57 AM     Echo 07/17/16 Study Conclusions  - Left ventricle: The cavity size was normal. There was mild focal   basal hypertrophy of the septum. Systolic function was normal.   The estimated ejection fraction was in the range of 55% to 60%.   Wall motion was normal; there were no regional wall motion   abnormalities. The study was not technically sufficient to allow   evaluation of LV diastolic dysfunction due to atrial   fibrillation. - Mitral valve: Calcified annulus. - Left atrium: The atrium was mildly dilated. - Pulmonary arteries: PA peak pressure: 31 mm Hg (S  Assessment/Plan   Active Problems:   Hypertension   DOE (dyspnea on exertion)   Atrial fibrillation (HCC)   Acute diastolic congestive heart failure (HCC)   CAD (coronary artery disease)    Chronic renal disease, stage II      Dyspnea, probably secondary to rapid afib. AFib diagnosed earlier this month. Started on xarelto + Cardizem. Chadsvasc 3. Cardizem dose increased at Cardiology hospital follow-up 07/25/16. Plan was for DCCV if continued with rapid rate. Presenting to ED today  with episode of dyspnea last Sunday and again today. No chest pain. Rate now 116 after Cardizem bolus and initiation of gtt      -Admit to Stepdown with cardiac monitoring -AFIB order set utilized -Consult Cardiology - Spoke with Dr. Radford Pax -Continue home Xarelto, cardizem gtt.  Chronic diastolic heart failure with recent admission for acute decompensation in setting of new AFib . Patient presents today with dyspnea, worse in supine position,  Elevated BNP of 414, increased interstitial markings without frank edema or effusions on CXR. On lasix 20mg  daily with good urine output at home. Only trace edema on exam. -monitor intake + output -daily wts.   -continue home lasix dose for now. Will get weight today and compare with last wt of 224 pounds 07/30/16.   Hypertension, BP levated in ED - SBP 140s / DBP 98-104. Has had home am cardizem, zestril, and lasix -add prn hydralazine -cardizem gtt may have added benefit as well  Prolonged QTC-503, 496 on 07/25/16 EKG -avoid QT prolonging medications -Normal K+ 3.7 and Mg+ 1.9  CAD, remote stenting 2006. No chest pain -continue plavix. Not on ASA -continue statin  Hyperlipidemia -Continue statin  GERD.   -Pepcid. Hold home PPI given prolonged QT interval.   DVT prophylaxis:     On  xarelto  Code Status:     Full code   Family Communication:    Treatment plan discussed with wife in the room and she understands and agrees with the plan..  Disposition Plan:   Discharge home in 48 hours             Consults called: Cardiology - Dr. Radford Pax  Admission status:    Admission -   stepdown   Tye Savoy NP [facility] Pager [phone]  If  7PM-7AM, please contact night-coverage [email or web address] Password [facility]  08/03/2016, 2:24 PM

## 2016-08-03 NOTE — ED Triage Notes (Signed)
PER EMS: pt from home with c/o SOB that started yesterday evening, trouble sleeping, cant lay flat. HR was 140, afib which recently was diagnosed 2 weeks ago. A&Ox4. BP-180/120 manual, O2-94% RA with tachypnea RR 30.

## 2016-08-04 DIAGNOSIS — K219 Gastro-esophageal reflux disease without esophagitis: Secondary | ICD-10-CM

## 2016-08-04 DIAGNOSIS — N183 Chronic kidney disease, stage 3 (moderate): Secondary | ICD-10-CM

## 2016-08-04 DIAGNOSIS — N182 Chronic kidney disease, stage 2 (mild): Secondary | ICD-10-CM

## 2016-08-04 LAB — CBC
HCT: 41.8 % (ref 39.0–52.0)
Hemoglobin: 13.9 g/dL (ref 13.0–17.0)
MCH: 32 pg (ref 26.0–34.0)
MCHC: 33.3 g/dL (ref 30.0–36.0)
MCV: 96.1 fL (ref 78.0–100.0)
Platelets: 178 10*3/uL (ref 150–400)
RBC: 4.35 MIL/uL (ref 4.22–5.81)
RDW: 13.2 % (ref 11.5–15.5)
WBC: 7 10*3/uL (ref 4.0–10.5)

## 2016-08-04 LAB — BASIC METABOLIC PANEL
ANION GAP: 10 (ref 5–15)
BUN: 21 mg/dL — ABNORMAL HIGH (ref 6–20)
CHLORIDE: 106 mmol/L (ref 101–111)
CO2: 26 mmol/L (ref 22–32)
CREATININE: 1.28 mg/dL — AB (ref 0.61–1.24)
Calcium: 9.2 mg/dL (ref 8.9–10.3)
GFR calc non Af Amer: 55 mL/min — ABNORMAL LOW (ref >60)
Glucose, Bld: 89 mg/dL (ref 65–99)
POTASSIUM: 3.2 mmol/L — AB (ref 3.5–5.1)
SODIUM: 142 mmol/L (ref 135–145)

## 2016-08-04 MED ORDER — RIVAROXABAN 20 MG PO TABS
20.0000 mg | ORAL_TABLET | Freq: Every day | ORAL | Status: DC
  Administered 2016-08-05 – 2016-08-09 (×5): 20 mg via ORAL
  Filled 2016-08-04 (×5): qty 1

## 2016-08-04 MED ORDER — POTASSIUM CHLORIDE CRYS ER 20 MEQ PO TBCR
40.0000 meq | EXTENDED_RELEASE_TABLET | Freq: Two times a day (BID) | ORAL | Status: AC
  Administered 2016-08-04 (×2): 40 meq via ORAL
  Filled 2016-08-04 (×2): qty 2

## 2016-08-04 MED ORDER — SODIUM CHLORIDE 0.9% FLUSH: 3.0000 mL | INTRAVENOUS | Status: DC | PRN

## 2016-08-04 MED ORDER — SODIUM CHLORIDE 0.9 % IV SOLN
250.0000 mL | INTRAVENOUS | Status: DC
  Administered 2016-08-04: 250 mL via INTRAVENOUS
  Administered 2016-08-05: 11:00:00 via INTRAVENOUS

## 2016-08-04 MED ORDER — POTASSIUM CHLORIDE CRYS ER 20 MEQ PO TBCR
20.0000 meq | EXTENDED_RELEASE_TABLET | Freq: Every day | ORAL | Status: DC
  Administered 2016-08-05 – 2016-08-09 (×5): 20 meq via ORAL
  Filled 2016-08-04 (×5): qty 1

## 2016-08-04 MED ORDER — SODIUM CHLORIDE 0.9% FLUSH
3.0000 mL | Freq: Two times a day (BID) | INTRAVENOUS | Status: DC
  Administered 2016-08-05 – 2016-08-09 (×2): 3 mL via INTRAVENOUS

## 2016-08-04 NOTE — Progress Notes (Signed)
SUBJECTIVE:  No complaints.  SOB much improved  OBJECTIVE:   Vitals:   Vitals:   08/04/16 0505 08/04/16 0605 08/04/16 0807 08/04/16 0826  BP: 123/87 (!) 112/95 119/83   Pulse:  91    Resp: 13 18 (!) 36   Temp:    97.7 F (36.5 C)  TempSrc:    Oral  SpO2: 99% 99%    Weight:      Height:       I&O's:   Intake/Output Summary (Last 24 hours) at 08/04/16 0936 Last data filed at 08/04/16 0900  Gross per 24 hour  Intake            202.5 ml  Output             2280 ml  Net          -2077.5 ml   TELEMETRY: Reviewed telemetry pt in atrial fibrillation     PHYSICAL EXAM General: Well developed, well nourished, in no acute distress Head: Eyes PERRLA, No xanthomas.   Normal cephalic and atramatic  Lungs:   Clear bilaterally to auscultation and percussion. Heart:   irregularly irregular  S1 S2 Pulses are 2+ & equal. Abdomen: Bowel sounds are positive, abdomen soft and non-tender without masses  Msk:  Back normal, normal gait. Normal strength and tone for age. Extremities:   No clubbing, cyanosis or edema.  DP +1 Neuro: Alert and oriented X 3. Psych:  Good affect, responds appropriately   LABS: Basic Metabolic Panel:  Recent Labs  08/03/16 1103 08/04/16 0438  NA 141 142  K 3.7 3.2*  CL 108 106  CO2 23 26  GLUCOSE 105* 89  BUN 22* 21*  CREATININE 1.47* 1.28*  CALCIUM 9.5 9.2  MG 1.9  --    Liver Function Tests: No results for input(s): AST, ALT, ALKPHOS, BILITOT, PROT, ALBUMIN in the last 72 hours. No results for input(s): LIPASE, AMYLASE in the last 72 hours. CBC:  Recent Labs  08/03/16 1103 08/04/16 0438  WBC 8.3 7.0  HGB 15.1 13.9  HCT 46.2 41.8  MCV 97.9 96.1  PLT 218 178   Cardiac Enzymes: No results for input(s): CKTOTAL, CKMB, CKMBINDEX, TROPONINI in the last 72 hours. BNP: Invalid input(s): POCBNP D-Dimer: No results for input(s): DDIMER in the last 72 hours. Hemoglobin A1C: No results for input(s): HGBA1C in the last 72 hours. Fasting  Lipid Panel: No results for input(s): CHOL, HDL, LDLCALC, TRIG, CHOLHDL, LDLDIRECT in the last 72 hours. Thyroid Function Tests:  Recent Labs  08/03/16 1125  TSH 0.871   Anemia Panel: No results for input(s): VITAMINB12, FOLATE, FERRITIN, TIBC, IRON, RETICCTPCT in the last 72 hours. Coag Panel:   Lab Results  Component Value Date   INR 2.94 08/03/2016   INR 1.04 07/16/2016    RADIOLOGY: Dg Chest 2 View  Result Date: 07/16/2016 CLINICAL DATA:  Heart attack 3 years ago. Increasing shortness of breath and hard time getting a deep breath starting about 3 days ago. EXAM: CHEST  2 VIEW COMPARISON:  02/03/2012 FINDINGS: Low volume film. Cardiopericardial silhouette is at upper limits of normal for size. There is pulmonary vascular congestion without overt pulmonary edema. No focal airspace consolidation or pleural effusion. Probable small hiatal hernia. The visualized bony structures of the thorax are intact. Telemetry leads overlie the chest. IMPRESSION: Borderline cardiomegaly with vascular congestion. Electronically Signed   By: Misty Stanley M.D.   On: 07/16/2016 13:53   Dg Chest [facility] 1 View  Result  Date: 08/03/2016 CLINICAL DATA:  AFib EXAM: PORTABLE CHEST 1 VIEW COMPARISON:  07/16/2016 FINDINGS: Increased interstitial markings. No frank interstitial edema. Mild left basilar opacity, possibly atelectasis. No pleural effusion or pneumothorax. Cardiomegaly. IMPRESSION: Increased interstitial markings without frank interstitial edema. Mild left basilar opacity, possibly atelectasis. Electronically Signed   By: Julian Hy M.D.   On: 08/03/2016 11:45    ASSESSMENT/PLAN:   1.  Persistent afib now with RVR. He is very symptomatic when his rate gets high.  He has been on Xarelto for 2 weeks and has not missed any doses.  HR in 90's on IV cardizem but jumps to the 130's when getting up.  Will continue on IV Cardizem and titrate gtt as needed. Will make NPO after MN and set up TEE/DCCV for  Monday.  Continue Xarelto and encouraged him to take it with his biggest meal of the day to increase absorption.  2.  SOB with elevated BNP and LE edema c/w acute diastolic CHF most likely for poorly controlled HR.  He put out 2L since admission on IV lasix 40mg  IV BID.  Will continue with IV diuretics and  follow I&O's.  Creatinine improved with diuresis.   3.  ASCAD s/p PCI of the LAD x 3 in 2006 - he has not had any chest pain. Continue Plavix - no ASA due to DOAC.   4. HTN - BP controlled on current meds. Continue ACE I.  5. CKD stage 3.  Creatinine improved from last admission.  6.  Hypokalemia - replete  Fransico Him, MD  08/04/2016  9:36 AM

## 2016-08-04 NOTE — Plan of Care (Signed)
Problem: Safety: Goal: Ability to remain free from injury will improve Outcome: Completed/Met Date Met: 08/04/16 Patient uses call light and is able to call RN/NT for his needs and for assistance and follows rules of the unit for safety issues appropriately.

## 2016-08-04 NOTE — Progress Notes (Signed)
[facility] PROGRESS NOTE  Peter [name] [id number] DOB: 1946/01/10 DOA: 08/03/2016 PCP: [name], MD  Interim summary and HPI 70 y.o. male with a medical history significant for, but not limited to coronary artery disease, chronic diastolic heart failure, and hypertension. Patient was hospitalized overnight earlier this month for new onset atrial fibrillation / acute on chronic diastolic heart failure. Patient was started on several toe as well as Cardizem. Seen for outpatient follow-up in cardiology office 07/25/16, still rapid rate and Cardizem increased to 240 mg daily. Plan was to follow-up in 3 weeks and plan for DCCV if needed. His weight was 224 pounds at that visit. Patient tries to follow a low-sodium diet, he does not weigh himself daily. He is compliant with Lasix  This past "Sunday patient had episode of shortness of breath lasting for several hours. No associated chest pain. The shortness of breath was not necessarily related to exertion, in fact patient had been painting outside. Shortness of breath most noticeable while lying in bed though patient was still able to sleep on 2 pillows as usual. Shortness of breath spontaneously resolved after several hours and patient was fine for the remainder of the week until yesterday. He is short of breath again, most noticeable in supine position. No coughing but he does hear gurgling in his chest when lying flat.  Assessment/Plan: 1-A. Fib with RVR: persistent -rate improved while on cardizem drip, but remained on A. Fib -continue xarelto Cardiology planning TEE/cardioversion on 9/25 -will follow rec's  2-SOB on admission: due to acute diastolic HF -associated with A. Fib with RVR -excellent response to diuresis  -will continue IV lasix  3-hypokalemia: -due to diuresis  -will replete as needed  -will check Mg  4-HTN: -BP is stable and well controlled -continue current antihypertensive regimen   5-CKD stage  3: -stable and at baseline -Cr even improved with diuresis -will monitor renal function trend   6-HLD: -continue statins   7-GERD -continue Pepcid   8-prolonged QTc -will monitor on telemetry -will follow cardiology rec's   Code Status: Full Family Communication: son, wife and sister at bedside  Disposition Plan: continue cardizem drip, replete electrolytes and follow cardiology rec's. Cardioversion planned for 9/25   Consultants:  Cardiology service   Procedures:  See below for x-ray reports  Cardioversion anticipated on 9/25  Antibiotics:  None   HPI/Subjective: Excellent diuresis overnight, no CP and no SOB. Still on A. Fib, rate better controlled.  Objective: Vitals:   08/04/16 1010 08/04/16 1213  BP: 118/61   Pulse:    Resp:    Temp:  97.4 F (36.3 C)    Intake/Output Summary (Last 24 hours) at 08/04/16 1542 Last data filed at 08/04/16 1500  Gross per 24 hour  Intake            256.1 ml  Output             27" 80 ml  Net          -2523.9 ml   Filed Weights   08/03/16 1740 08/04/16 0405  Weight: 102.6 kg (226 lb 1.6 oz) 100.4 kg (221 lb 6.4 oz)    Exam:   General:  Afebrile, reports he is breathing better and denies CP.  Cardiovascular: irregular, no rubs, no gallops, no murmur  Respiratory: no wheezing, no frank crackles; good air movement   Abdomen: soft, NT, ND, positive BS  Musculoskeletal: no cyanosis or edema on exam   Data Reviewed: Basic Metabolic Panel:  Recent  Labs Lab 08/03/16 1103 08/04/16 0438  NA 141 142  K 3.7 3.2*  CL 108 106  CO2 23 26  GLUCOSE 105* 89  BUN 22* 21*  CREATININE 1.47* 1.28*  CALCIUM 9.5 9.2  MG 1.9  --    CBC:  Recent Labs Lab 08/03/16 1103 08/04/16 0438  WBC 8.3 7.0  HGB 15.1 13.9  HCT 46.2 41.8  MCV 97.9 96.1  PLT 218 178   BNP (last 3 results)  Recent Labs  07/16/16 1332 08/03/16 1103  BNP 304.1* 414.8*   CBG: No results for input(s): GLUCAP in the last 168  hours.  Recent Results (from the past 240 hour(s))  MRSA PCR Screening     Status: None   Collection Time: 08/03/16  5:37 PM  Result Value Ref Range Status   MRSA by PCR NEGATIVE NEGATIVE Final    Comment:        The GeneXpert MRSA Assay (FDA approved for NASAL specimens only), is one component of a comprehensive MRSA colonization surveillance program. It is not intended to diagnose MRSA infection nor to guide or monitor treatment for MRSA infections.      Studies: Dg Chest Port 1 View  Result Date: 08/03/2016 CLINICAL DATA:  AFib EXAM: PORTABLE CHEST 1 VIEW COMPARISON:  07/16/2016 FINDINGS: Increased interstitial markings. No frank interstitial edema. Mild left basilar opacity, possibly atelectasis. No pleural effusion or pneumothorax. Cardiomegaly. IMPRESSION: Increased interstitial markings without frank interstitial edema. Mild left basilar opacity, possibly atelectasis. Electronically Signed   By: Julian Hy M.D.   On: 08/03/2016 11:45    Scheduled Meds: . atorvastatin  40 mg Oral q morning - 10a  . clopidogrel  75 mg Oral Daily  . famotidine  20 mg Oral Daily  . fenofibrate  160 mg Oral Daily  . ferrous sulfate  325 mg Oral q morning - 10a  . furosemide  40 mg Intravenous BID  . lisinopril  10 mg Oral Daily  . pantoprazole  40 mg Oral Daily  . [START ON 08/05/2016] potassium chloride  20 mEq Oral Daily  . potassium chloride  40 mEq Oral BID  . rivaroxaban  20 mg Oral Daily  . sodium chloride flush  3 mL Intravenous Q12H  . sodium chloride flush  3 mL Intravenous Q12H   Continuous Infusions: . sodium chloride 250 mL (08/04/16 1500)  . diltiazem (CARDIZEM) infusion 5 mg/hr (08/04/16 1500)    Active Problems:   Hypertension   CAD (coronary artery disease)   DOE (dyspnea on exertion)   Chronic renal disease, stage II   Atrial fibrillation (HCC)   Acute diastolic congestive heart failure (HCC)   Atrial fibrillation with RVR ([place])    Time spent: 30  minutes    Barton Dubois  [facility] Pager [phone]. If 7PM-7AM, please contact night-coverage at [email or web address], password [facility] 08/04/2016, 3:42 PM  LOS: 1 day

## 2016-08-04 NOTE — Plan of Care (Signed)
Problem: Education: Goal: Knowledge of disease or condition will improve Outcome: Progressing Patient was instructed and questions answered regarding his A-fib., Cardizem drip and his Cardioversion in the am and verbalized understanding, will continue to monitor and instruct as needed.

## 2016-08-05 ENCOUNTER — Encounter (HOSPITAL_COMMUNITY): Payer: Self-pay | Admitting: *Deleted

## 2016-08-05 ENCOUNTER — Inpatient Hospital Stay (HOSPITAL_COMMUNITY): Payer: Medicare Other

## 2016-08-05 ENCOUNTER — Inpatient Hospital Stay (HOSPITAL_COMMUNITY): Payer: Medicare Other | Admitting: Certified Registered Nurse Anesthetist

## 2016-08-05 ENCOUNTER — Encounter (HOSPITAL_COMMUNITY)
Admission: EM | Disposition: A | Discharge status: Home / Self Care | Payer: Self-pay | Source: Home / Self Care | Attending: Internal Medicine

## 2016-08-05 DIAGNOSIS — R0609 Other forms of dyspnea: Secondary | ICD-10-CM

## 2016-08-05 DIAGNOSIS — I4891 Unspecified atrial fibrillation: Secondary | ICD-10-CM

## 2016-08-05 HISTORY — PX: CARDIOVERSION: SHX1299

## 2016-08-05 HISTORY — PX: TEE WITHOUT CARDIOVERSION: SHX5443

## 2016-08-05 LAB — BASIC METABOLIC PANEL
ANION GAP: 9 (ref 5–15)
BUN: 21 mg/dL — AB (ref 6–20)
CALCIUM: 9.2 mg/dL (ref 8.9–10.3)
CO2: 26 mmol/L (ref 22–32)
CREATININE: 1.34 mg/dL — AB (ref 0.61–1.24)
Chloride: 106 mmol/L (ref 101–111)
GFR calc non Af Amer: 52 mL/min — ABNORMAL LOW (ref >60)
GFR, EST AFRICAN AMERICAN: 60 mL/min — AB (ref >60)
Glucose, Bld: 96 mg/dL (ref 65–99)
Potassium: 3.7 mmol/L (ref 3.5–5.1)
Sodium: 141 mmol/L (ref 135–145)

## 2016-08-05 LAB — MAGNESIUM: Magnesium: 1.9 mg/dL (ref 1.7–2.4)

## 2016-08-05 SURGERY — ECHOCARDIOGRAM, TRANSESOPHAGEAL
Anesthesia: Monitor Anesthesia Care

## 2016-08-05 MED ORDER — HYDROCORTISONE 1 % EX OINT
TOPICAL_OINTMENT | Freq: Four times a day (QID) | CUTANEOUS | Status: DC
  Administered 2016-08-05 (×2): via TOPICAL
  Administered 2016-08-05: 1 via TOPICAL
  Administered 2016-08-06 (×2): via TOPICAL
  Administered 2016-08-06 (×2): 1 via TOPICAL
  Administered 2016-08-07 (×2): via TOPICAL
  Filled 2016-08-05: qty 28.35

## 2016-08-05 MED ORDER — LIDOCAINE 2% (20 MG/ML) 5 ML SYRINGE
INTRAMUSCULAR | Status: DC | PRN
  Administered 2016-08-05: 40 mg via INTRAVENOUS

## 2016-08-05 MED ORDER — ONDANSETRON HCL 4 MG/2ML IJ SOLN: 4.0000 mg | Freq: Once | INTRAMUSCULAR | Status: DC | PRN

## 2016-08-05 MED ORDER — SODIUM CHLORIDE 0.9 % IV SOLN
INTRAVENOUS | Status: DC
  Administered 2016-08-05: 10:00:00 via INTRAVENOUS

## 2016-08-05 MED ORDER — AMIODARONE HCL IN DEXTROSE 360-4.14 MG/200ML-% IV SOLN
30.0000 mg/h | INTRAVENOUS | Status: DC
  Administered 2016-08-06 – 2016-08-09 (×6): 30 mg/h via INTRAVENOUS
  Filled 2016-08-05 (×8): qty 200

## 2016-08-05 MED ORDER — BUTAMBEN-TETRACAINE-BENZOCAINE 2-2-14 % EX AERO
INHALATION_SPRAY | CUTANEOUS | Status: DC | PRN
  Administered 2016-08-05: 2 via TOPICAL

## 2016-08-05 MED ORDER — AMIODARONE LOAD VIA INFUSION
150.0000 mg | Freq: Once | INTRAVENOUS | Status: AC
  Administered 2016-08-05: 150 mg via INTRAVENOUS
  Filled 2016-08-05: qty 83.34

## 2016-08-05 MED ORDER — PROPOFOL 500 MG/50ML IV EMUL
INTRAVENOUS | Status: DC | PRN
  Administered 2016-08-05: 100 ug/kg/min via INTRAVENOUS

## 2016-08-05 MED ORDER — FUROSEMIDE 40 MG PO TABS
40.0000 mg | ORAL_TABLET | Freq: Every day | ORAL | Status: DC
  Administered 2016-08-05 – 2016-08-09 (×5): 40 mg via ORAL
  Filled 2016-08-05 (×5): qty 1

## 2016-08-05 MED ORDER — FENTANYL CITRATE (PF) 100 MCG/2ML IJ SOLN: 25.0000 ug | INTRAMUSCULAR | Status: DC | PRN

## 2016-08-05 MED ORDER — AMIODARONE HCL IN DEXTROSE 360-4.14 MG/200ML-% IV SOLN
60.0000 mg/h | INTRAVENOUS | Status: AC
  Administered 2016-08-05: 60 mg/h via INTRAVENOUS
  Filled 2016-08-05: qty 200

## 2016-08-05 NOTE — Transfer of Care (Signed)
Immediate Anesthesia Transfer of Care Note  Patient: Peter [name]  Procedure(s) Performed: Procedure(s): TRANSESOPHAGEAL ECHOCARDIOGRAM (TEE) (N/A) CARDIOVERSION (N/A)  Patient Location: PACU  Anesthesia Type:MAC  Level of Consciousness: awake, patient cooperative and responds to stimulation  Airway & Oxygen Therapy: Patient Spontanous Breathing and Patient connected to nasal cannula oxygen  Post-op Assessment: Report given to RN and Post -op Vital signs reviewed and stable  Post vital signs: Reviewed and stable  Last Vitals:  Vitals:   08/05/16 1013 08/05/16 1117  BP: 138/71 112/81  Pulse: (!) 120 (!) 53  Resp: 17 19  Temp: 36.6 C     Last Pain:  Vitals:   08/05/16 1117  TempSrc: Oral  PainSc:       Patients Stated Pain Goal: 0 ([id number])  Complications: No apparent anesthesia complications

## 2016-08-05 NOTE — CV Procedure (Signed)
Procedure: Electrical Cardioversion Indications:  Atrial Fibrillation  Procedure Details:  Consent: Risks of procedure as well as the alternatives and risks of each were explained to the (patient/caregiver).  Consent for procedure obtained.  Time Out: Verified patient identification, verified procedure, site/side was marked, verified correct patient position, special equipment/implants available, medications/allergies/relevent history reviewed, required imaging and test results available.  Performed  Patient placed on cardiac monitor, pulse oximetry, supplemental oxygen as necessary.  Sedation given: IV propofol, Dr. Cheral Bay Pacer pads placed anterior and posterior chest.  Cardioverted 3 time(s).  Cardioversion with synchronized biphasic 120J shock, 150J shock, 200J shock (with pressure on anterior chest pad). The rhythm remained AFib.  Evaluation: Findings: Post procedure EKG shows: Atrial Fibrillation Complications: None Patient did tolerate procedure well.  Time Spent Directly with the Patient:  30 minutes   Peter [name] 08/05/2016, 11:25 AM

## 2016-08-05 NOTE — Telephone Encounter (Signed)
Currently admitted.  Possible cardioversion 9/26.  Reviewed with Dr Marlou Porch.

## 2016-08-05 NOTE — Anesthesia Postprocedure Evaluation (Signed)
Anesthesia Post Note  Patient: [name]  Procedure(s) Performed: Procedure(s) (LRB): TRANSESOPHAGEAL ECHOCARDIOGRAM (TEE) (N/A) CARDIOVERSION (N/A)  Patient location during evaluation: PACU Anesthesia Type: MAC Level of consciousness: awake and alert Pain management: pain level controlled Vital Signs Assessment: post-procedure vital signs reviewed and stable Respiratory status: spontaneous breathing, nonlabored ventilation and respiratory function stable Cardiovascular status: stable and blood pressure returned to baseline Anesthetic complications: no    Last Vitals:  Vitals:   08/05/16 1120 08/05/16 1130  BP: 112/81 110/78  Pulse: (!) 118 60  Resp: 18 19  Temp:      Last Pain:  Vitals:   08/05/16 1117  TempSrc: Oral  PainSc:                  Nilda Simmer

## 2016-08-05 NOTE — Progress Notes (Signed)
SUBJECTIVE:   No complaints.  SOB much improved, now able to lie flat.  OBJECTIVE:   Vitals:   Vitals:   08/05/16 0410 08/05/16 0522 08/05/16 0610 08/05/16 0752  BP: 137/86 117/65 (!) 126/93 (!) 144/88  Pulse: 88 64 91   Resp: 12 11 13  (!) 27  Temp:  97.7 F (36.5 C)  98 F (36.7 C)  TempSrc:  Oral  Oral  SpO2: 100% 100% 99% 97%  Weight:  221 lb 8 oz (100.5 kg)    Height:       I&O's:    Intake/Output Summary (Last 24 hours) at 08/05/16 0804 Last data filed at 08/05/16 0600  Gross per 24 hour  Intake           828.76 ml  Output             1780 ml  Net          -951.24 ml   TELEMETRY: Reviewed telemetry pt in atrial fibrillation     PHYSICAL EXAM General: Well developed, well nourished, in no acute distress, lying flat Head: Eyes PERRLA, No xanthomas.   Normal cephalic and atramatic  Lungs:   Clear bilaterally to auscultation and percussion. Heart:   irregularly irregular  S1 S2 Pulses are 2+ & equal. Abdomen: Bowel sounds are positive, abdomen soft and non-tender without masses  Msk:  Back normal, normal gait. Normal strength and tone for age. Extremities:   No clubbing, cyanosis or edema.  DP +1 Neuro: Alert and oriented X 3. Psych:  Good affect, responds appropriately   LABS: Basic Metabolic Panel:  Recent Labs  08/03/16 1103 08/04/16 0438 08/05/16 0601  NA 141 142 141  K 3.7 3.2* 3.7  CL 108 106 106  CO2 23 26 26   GLUCOSE 105* 89 96  BUN 22* 21* 21*  CREATININE 1.47* 1.28* 1.34*  CALCIUM 9.5 9.2 9.2  MG 1.9  --  1.9   Liver Function Tests: No results for input(s): AST, ALT, ALKPHOS, BILITOT, PROT, ALBUMIN in the last 72 hours. No results for input(s): LIPASE, AMYLASE in the last 72 hours. CBC:  Recent Labs  08/03/16 1103 08/04/16 0438  WBC 8.3 7.0  HGB 15.1 13.9  HCT 46.2 41.8  MCV 97.9 96.1  PLT 218 178   Cardiac Enzymes: No results for input(s): CKTOTAL, CKMB, CKMBINDEX, TROPONINI in the last 72 hours. BNP: Invalid input(s):  POCBNP D-Dimer: No results for input(s): DDIMER in the last 72 hours. Hemoglobin A1C: No results for input(s): HGBA1C in the last 72 hours. Fasting Lipid Panel: No results for input(s): CHOL, HDL, LDLCALC, TRIG, CHOLHDL, LDLDIRECT in the last 72 hours. Thyroid Function Tests:  Recent Labs  08/03/16 1125  TSH 0.871   Anemia Panel: No results for input(s): VITAMINB12, FOLATE, FERRITIN, TIBC, IRON, RETICCTPCT in the last 72 hours. Coag Panel:   Lab Results  Component Value Date   INR 2.94 08/03/2016   INR 1.04 07/16/2016    RADIOLOGY: Dg Chest 2 View  Result Date: 07/16/2016 CLINICAL DATA:  Heart attack 3 years ago. Increasing shortness of breath and hard time getting a deep breath starting about 3 days ago. EXAM: CHEST  2 VIEW COMPARISON:  02/03/2012 FINDINGS: Low volume film. Cardiopericardial silhouette is at upper limits of normal for size. There is pulmonary vascular congestion without overt pulmonary edema. No focal airspace consolidation or pleural effusion. Probable small hiatal hernia. The visualized bony structures of the thorax are intact. Telemetry leads overlie the chest. IMPRESSION:  Borderline cardiomegaly with vascular congestion. Electronically Signed   By: Misty Stanley M.D.   On: 07/16/2016 13:53   Dg Chest Port 1 View  Result Date: 08/03/2016 CLINICAL DATA:  AFib EXAM: PORTABLE CHEST 1 VIEW COMPARISON:  07/16/2016 FINDINGS: Increased interstitial markings. No frank interstitial edema. Mild left basilar opacity, possibly atelectasis. No pleural effusion or pneumothorax. Cardiomegaly. IMPRESSION: Increased interstitial markings without frank interstitial edema. Mild left basilar opacity, possibly atelectasis. Electronically Signed   By: Julian Hy M.D.   On: 08/03/2016 11:45    ASSESSMENT/PLAN:   1.  Persistent afib now with RVR. He is very symptomatic when his rate gets high.  He has been on Xarelto for 2 weeks and has not missed any doses.  HR in 90's on IV  cardizem but jumps to the 130's when getting up.  Will continue on IV Cardizem and titrate gtt as needed. He is NPO - not on the schedule today as far as I can tell, more likely to be tomorrow.  2.  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 Will switch to oral diuretics today.  3.  ASCAD s/p PCI of the LAD x 3 in 2006 - he has not had any chest pain. Continue Plavix - no ASA due to DOAC.   4. HTN - BP mildly elevated. Continue ACE I  5. CKD stage 3.  Creatinine around baseline.   6.  Hypokalemia - 3.7 today.  Pixie Casino, MD  08/05/2016  8:04 AM

## 2016-08-05 NOTE — Consult Note (Signed)
   [facility] CM Inpatient Consult   08/05/2016  Peter [name] 12/15/1945 ID:[id number]    Patient screened for [facility] Care Management services. Went to bedside to offer and explain [facility] Care Management program with patient. [name] declined [place] Management follow up.  He stated " I will be fine after I get this fixed. Also, the [facility] nurse has called me several times after I was at [facility]. I have good follow up". Accepted [facility] Care Management brochure, 24- hr nurse line magnet with contact information to call in future if changes mind. Appreciative of visit. Made inpatient RNCM aware that [name] declined [place] Management program services.  Marthenia Rolling, MSN-Ed, RN,BSN [facility] Liaison [phone]

## 2016-08-05 NOTE — Op Note (Signed)
INDICATIONS: atrial fibrillation  PROCEDURE:   Informed consent was obtained prior to the procedure. The risks, benefits and alternatives for the procedure were discussed and the patient comprehended these risks.  Risks include, but are not limited to, cough, sore throat, vomiting, nausea, somnolence, esophageal and stomach trauma or perforation, bleeding, low blood pressure, aspiration, pneumonia, infection, trauma to the teeth and death.    After a procedural time-out, the oropharynx was anesthetized with 20% benzocaine spray.   During this procedure the patient was administered IV propofol by Anesthesiology.  The transesophageal probe was inserted in the esophagus and stomach without difficulty and multiple views were obtained.  The patient was kept under observation until the patient left the procedure room.  The patient left the procedure room in stable condition.   Agitated microbubble saline contrast was not administered.  COMPLICATIONS:    There were no immediate complications.  FINDINGS:  Severe LV dysfunction, LVEF 25-30%. Moderate to severely dilated left atrium with spontaneous echo contrast, but without thrombus. Normal appendage emptying velocities. 2+ MR 3+ TR Normal AV and PV. Mild aortic plaque.  RECOMMENDATIONS:     Proceed with DCCV.  Time Spent Directly with the Patient:  30 minutes   Beniah Magnan 08/05/2016, 11:23 AM

## 2016-08-05 NOTE — Progress Notes (Signed)
Burned area noted to pts mid chest following cardioversion, PA notified with new order for hydrocortisone ointment received, Edward Qualia RN

## 2016-08-05 NOTE — Anesthesia Preprocedure Evaluation (Addendum)
Anesthesia Evaluation  Patient identified by MRN, date of birth, ID band Patient awake    Reviewed: Allergy & Precautions, NPO status , Patient's Chart, lab work & pertinent test results  History of Anesthesia Complications Negative for: history of anesthetic complications  Airway Mallampati: III  TM Distance: >3 FB Neck ROM: Full    Dental  (+) Teeth Intact, Dental Advisory Given   Pulmonary neg pulmonary ROS,    Pulmonary exam normal breath sounds clear to auscultation       Cardiovascular hypertension, Pt. 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 There was mild focal   basal hypertrophy of the septum. Systolic function was normal.   The estimated ejection fraction was in the range of 55% to 60%.   Wall motion was normal; there were no regional wall motion   abnormalities. The study was not technically sufficient to allow   evaluation of LV diastolic dysfunction due to atrial   fibrillation. - Mitral valve: Calcified annulus. - Left atrium: The atrium was mildly dilated. - Pulmonary arteries: PA peak pressure: 31 mm Hg (s).  Myocardial Perfusion Imaging 01/06/2013: normal   Neuro/Psych negative neurological ROS     GI/Hepatic Neg liver ROS, GERD  Medicated and Controlled,  Endo/Other  negative endocrine ROS  Renal/GU CRFRenal disease     Musculoskeletal   Abdominal   Peds  Hematology  (+) Blood dyscrasia (iron deficiency), anemia ,   Anesthesia Other Findings   Reproductive/Obstetrics                            Anesthesia Physical Anesthesia Plan  ASA: III  Anesthesia Plan: MAC   Post-op Pain Management:    Induction:  Intravenous  Airway Management Planned: Natural Airway and Nasal Cannula  Additional Equipment:   Intra-op Plan:   Post-operative Plan:   Informed Consent: I have reviewed the patients History and Physical, chart, labs and discussed the procedure including the risks, benefits and alternatives for the proposed anesthesia with the patient or authorized representative who has indicated his/her understanding and acceptance.   Dental advisory given  Plan Discussed with: CRNA  Anesthesia Plan Comments:        Anesthesia Quick Evaluation

## 2016-08-05 NOTE — Progress Notes (Signed)
Nurse was notified at 1300 by  Central tele, pt sustaining HR 130s to 150s  titrated cardizem per protocol, HR at present, low 100s, denies CP, SOB at rest, VSS,  will continue to monitor closely, Edward Qualia RN

## 2016-08-05 NOTE — Progress Notes (Signed)
Discussed with Dr. Sallyanne Kuster - DCCV was unsuccessful this morning. LVEF is depressed on echo at 30-35%, suspicious for rate-related cardiomyopathy. This explains his CHF presentation. At this point, antiarrythmic options are limited. I would favor starting IV amiodarone and weaning down his cardizem. After he is successfully loaded, we could consider repeat DCCV if he does not convert spontaneously to NSR at the end of the week.  Updated the patient on these recommendations.  Pixie Casino, MD, [facility] Attending Cardiologist [place]

## 2016-08-06 DIAGNOSIS — I481 Persistent atrial fibrillation: Principal | ICD-10-CM

## 2016-08-06 NOTE — Care Management Important Message (Signed)
Important Message  Patient Details  Name: Peter [name] MRN: [id number] Date of Birth: [birth year]   Medicare Important Message Given:  Yes    Nathen May 08/06/2016, 11:15 AM

## 2016-08-06 NOTE — Progress Notes (Signed)
Patient Name: Peter [name] Date of Encounter: 08/06/2016   SUBJECTIVE  Rate still up with ambulation. No chest pain or sob.   CURRENT MEDS . atorvastatin  40 mg Oral q morning - 10a  . clopidogrel  75 mg Oral Daily  . famotidine  20 mg Oral Daily  . fenofibrate  160 mg Oral Daily  . ferrous sulfate  325 mg Oral q morning - 10a  . furosemide  40 mg Oral Daily  . hydrocortisone   Topical QID  . lisinopril  10 mg Oral Daily  . pantoprazole  40 mg Oral Daily  . potassium chloride  20 mEq Oral Daily  . rivaroxaban  20 mg Oral Q breakfast  . sodium chloride flush  3 mL Intravenous Q12H  . sodium chloride flush  3 mL Intravenous Q12H    OBJECTIVE  Vitals:   08/05/16 1946 08/05/16 2327 08/06/16 0500 08/06/16 0745  BP: (!) 129/54 101/61 115/82 115/80  Pulse: 81 87 83 92  Resp: (!) 21 (!) 22 14 16   Temp: 98.7 F (37.1 C) 97.5 F (36.4 C) 97.9 F (36.6 C) 97.8 F (36.6 C)  TempSrc: Oral Oral Oral Oral  SpO2: 95% 99% 97% 99%  Weight:   221 lb (100.2 kg)   Height:        Intake/Output Summary (Last 24 hours) at 08/06/16 0905 Last data filed at 08/06/16 0500  Gross per 24 hour  Intake              796 ml  Output             1150 ml  Net             -354 ml   Filed Weights   08/05/16 0522 08/05/16 1013 08/06/16 0500  Weight: 221 lb 8 oz (100.5 kg) 221 lb 8 oz (100.5 kg) 221 lb (100.2 kg)    PHYSICAL EXAM  General: Pleasant, NAD. Neuro: Alert and oriented X 3. Moves all extremities spontaneously. Psych: Normal affect. HEENT:  Normal  Neck: Supple without bruits or JVD. Lungs:  Resp regular and unlabored, CTA. Heart: Ir Ir tachycardic no s3, s4, or murmurs. Abdomen: Soft, non-tender, non-distended, BS + x 4.  Extremities: No clubbing, cyanosis or edema. DP/PT/Radials 2+ and equal bilaterally.  Accessory Clinical Findings  CBC  Recent Labs  08/03/16 1103 08/04/16 0438  WBC 8.3 7.0  HGB 15.1 13.9  HCT 46.2 41.8  MCV 97.9 96.1  PLT 218 0000000   Basic  Metabolic Panel  Recent Labs  08/03/16 1103 08/04/16 0438 08/05/16 0601  NA 141 142 141  K 3.7 3.2* 3.7  CL 108 106 106  CO2 23 26 26   GLUCOSE 105* 89 96  BUN 22* 21* 21*  CREATININE 1.47* 1.28* 1.34*  CALCIUM 9.5 9.2 9.2  MG 1.9  --  1.9   Liver Function Tests No results for input(s): AST, ALT, ALKPHOS, BILITOT, PROT, ALBUMIN in the last 72 hours. No results for input(s): LIPASE, AMYLASE in the last 72 hours. Cardiac Enzymes No results for input(s): CKTOTAL, CKMB, CKMBINDEX, TROPONINI in the last 72 hours. BNP Invalid input(s): POCBNP D-Dimer No results for input(s): DDIMER in the last 72 hours. Hemoglobin A1C No results for input(s): HGBA1C in the last 72 hours. Fasting Lipid Panel No results for input(s): CHOL, HDL, LDLCALC, TRIG, CHOLHDL, LDLDIRECT in the last 72 hours. Thyroid Function Tests  Recent Labs  08/03/16 1125  TSH 0.871    TELE  afib  at rate of 100-120s   TEE  LV EF: 30% -   35%  ------------------------------------------------------------------- History:   PMH:   Atrial fibrillation.  ------------------------------------------------------------------- Study Conclusions  - Procedure narrative: Defibrillation. The rhythm could not be   converted from atrial fibrillation, despite application of 3   shocks. The final rhythm was atrial fibrillation. - Left ventricle: The cavity size was normal. Systolic function was   moderately to severely reduced. The estimated ejection fraction   was in the range of 30% to 35%. Moderate diffuse hypokinesis with   no identifiable regional variations. No evidence of thrombus. - Aortic valve: There was trivial regurgitation. - Mitral valve: There was mild regurgitation directed centrally. - Left atrium: The atrium was severely dilated. No evidence of   thrombus in the atrial cavity or appendage. No evidence of   thrombus in the atrial cavity or appendage. No evidence of   thrombus in the atrial cavity or  appendage. There was   moderatecontinuous spontaneous echo contrast (&quot;smoke&quot;) in the   cavity and the appendage. - Atrial septum: No defect or patent foramen ovale was identified. - Tricuspid valve: There was moderate regurgitation directed   centrally.  Impressions:  - Successful cardioversion. No cardiac source of emboli was   indentified.  Radiology/Studies  Dg Chest 2 View  Result Date: 07/16/2016 CLINICAL DATA:  Heart attack 3 years ago. Increasing shortness of breath and hard time getting a deep breath starting about 3 days ago. EXAM: CHEST  2 VIEW COMPARISON:  02/03/2012 FINDINGS: Low volume film. Cardiopericardial silhouette is at upper limits of normal for size. There is pulmonary vascular congestion without overt pulmonary edema. No focal airspace consolidation or pleural effusion. Probable small hiatal hernia. The visualized bony structures of the thorax are intact. Telemetry leads overlie the chest. IMPRESSION: Borderline cardiomegaly with vascular congestion. Electronically Signed   By: Misty Stanley M.D.   On: 07/16/2016 13:53   Dg Chest Port 1 View  Result Date: 08/03/2016 CLINICAL DATA:  AFib EXAM: PORTABLE CHEST 1 VIEW COMPARISON:  07/16/2016 FINDINGS: Increased interstitial markings. No frank interstitial edema. Mild left basilar opacity, possibly atelectasis. No pleural effusion or pneumothorax. Cardiomegaly. IMPRESSION: Increased interstitial markings without frank interstitial edema. Mild left basilar opacity, possibly atelectasis. Electronically Signed   By: Julian Hy M.D.   On: 08/03/2016 11:45    ASSESSMENT AND PLAN  1. Persistent afib now with RVR. He is very symptomatic when his rate gets high. Continue Xarelto for anticoagulation. Placed on IV cardizem without improvement of rate. S/p unsuccessful DCCV. No thrombus on TEE. No now IV amiodarone lode. Weaning off IV Cardizem (currently on 73ml/hr). Tentatively plan for DCCV Thursday.   2. 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 EF of 30-35 on TEE. Diuresed negative 3.2. Continue Lasix 40mg  qd.   3. ASCAD s/p PCI of the LAD x 3 in 2006 - he has not had any chest pain. Continue Plavix - no ASA due to DOAC.   4. HTN -Stable. Continue ACE I  5. CKD stage 3. Creatinine around baseline.   6.  Hypokalemia - Resolved  Signed, Anjanette Gilkey PA-C Pager [phone]

## 2016-08-07 DIAGNOSIS — I5021 Acute systolic (congestive) heart failure: Secondary | ICD-10-CM

## 2016-08-07 MED ORDER — SODIUM CHLORIDE 0.9 % IV SOLN
250.0000 mL | INTRAVENOUS | Status: DC
  Administered 2016-08-08: 14:00:00 via INTRAVENOUS

## 2016-08-07 MED ORDER — SODIUM CHLORIDE 0.9% FLUSH
3.0000 mL | Freq: Two times a day (BID) | INTRAVENOUS | Status: DC
  Administered 2016-08-08 – 2016-08-09 (×2): 3 mL via INTRAVENOUS

## 2016-08-07 MED ORDER — SODIUM CHLORIDE 0.9% FLUSH: 3.0000 mL | INTRAVENOUS | Status: DC | PRN

## 2016-08-07 NOTE — Progress Notes (Signed)
Patient Name: [name] Date of Encounter: 08/07/2016   SUBJECTIVE Remains in a-fib with HR in the 90-100's  CURRENT MEDS . atorvastatin  40 mg Oral q morning - 10a  . clopidogrel  75 mg Oral Daily  . famotidine  20 mg Oral Daily  . fenofibrate  160 mg Oral Daily  . ferrous sulfate  325 mg Oral q morning - 10a  . furosemide  40 mg Oral Daily  . hydrocortisone   Topical QID  . lisinopril  10 mg Oral Daily  . pantoprazole  40 mg Oral Daily  . potassium chloride  20 mEq Oral Daily  . rivaroxaban  20 mg Oral Q breakfast  . sodium chloride flush  3 mL Intravenous Q12H  . sodium chloride flush  3 mL Intravenous Q12H    OBJECTIVE  Vitals:   08/06/16 2058 08/07/16 0031 08/07/16 0413 08/07/16 0900  BP: 125/88   121/75  Pulse: 94   97  Resp: 20   14  Temp: 97.6 F (36.4 C) 98.4 F (36.9 C) 97.7 F (36.5 C) 98.2 F (36.8 C)  TempSrc: Oral Oral Oral Oral  SpO2: 95%   95%  Weight:      Height:        Intake/Output Summary (Last 24 hours) at 08/07/16 0953 Last data filed at 08/07/16 0905  Gross per 24 hour  Intake          1138.42 ml  Output             1485 ml  Net          -346.58 ml   Filed Weights   08/05/16 0522 08/05/16 1013 08/06/16 0500  Weight: 221 lb 8 oz (100.5 kg) 221 lb 8 oz (100.5 kg) 221 lb (100.2 kg)    PHYSICAL EXAM  General: Pleasant, NAD. Neuro: Alert and oriented X 3. Moves all extremities spontaneously. Psych: Normal affect. HEENT:  Normal  Neck: Supple without bruits or JVD. Lungs:  Resp regular and unlabored, CTA. Heart: Ir Ir tachycardic no s3, s4, or murmurs. Abdomen: Soft, non-tender, non-distended, BS + x 4.  Extremities: No clubbing, cyanosis or edema. DP/PT/Radials 2+ and equal bilaterally.  Accessory Clinical Findings  CBC No results for input(s): WBC, NEUTROABS, HGB, HCT, MCV, PLT in the last 72 hours. Basic Metabolic Panel  Recent Labs  08/05/16 0601  NA 141  K 3.7  CL 106  CO2 26  GLUCOSE 96  BUN 21*    CREATININE 1.34*  CALCIUM 9.2  MG 1.9   Liver Function Tests No results for input(s): AST, ALT, ALKPHOS, BILITOT, PROT, ALBUMIN in the last 72 hours. No results for input(s): LIPASE, AMYLASE in the last 72 hours. Cardiac Enzymes No results for input(s): CKTOTAL, CKMB, CKMBINDEX, TROPONINI in the last 72 hours. BNP Invalid input(s): POCBNP D-Dimer No results for input(s): DDIMER in the last 72 hours. Hemoglobin A1C No results for input(s): HGBA1C in the last 72 hours. Fasting Lipid Panel No results for input(s): CHOL, HDL, LDLCALC, TRIG, CHOLHDL, LDLDIRECT in the last 72 hours. Thyroid Function Tests No results for input(s): TSH, T4TOTAL, T3FREE, THYROIDAB in the last 72 hours.  Invalid input(s): FREET3  TELE  afib at rate of 90-100's   TEE  LV EF: 30% -   35%  ------------------------------------------------------------------- History:   PMH:   Atrial fibrillation.  ------------------------------------------------------------------- Study Conclusions  - Procedure narrative: Defibrillation. The rhythm could not be   converted from atrial fibrillation, despite application of 3  shocks. The final rhythm was atrial fibrillation. - Left ventricle: The cavity size was normal. Systolic function was   moderately to severely reduced. The estimated ejection fraction   was in the range of 30% to 35%. Moderate diffuse hypokinesis with   no identifiable regional variations. No evidence of thrombus. - Aortic valve: There was trivial regurgitation. - Mitral valve: There was mild regurgitation directed centrally. - Left atrium: The atrium was severely dilated. No evidence of   thrombus in the atrial cavity or appendage. No evidence of   thrombus in the atrial cavity or appendage. No evidence of   thrombus in the atrial cavity or appendage. There was   moderatecontinuous spontaneous echo contrast (&quot;smoke&quot;) in the   cavity and the appendage. - Atrial septum: No  defect or patent foramen ovale was identified. - Tricuspid valve: There was moderate regurgitation directed   centrally.  Impressions:  - Unsuccessful cardioversion. No cardiac source of emboli was   indentified.  Radiology/Studies  Dg Chest 2 View  Result Date: 07/16/2016 CLINICAL DATA:  Heart attack 3 years ago. Increasing shortness of breath and hard time getting a deep breath starting about 3 days ago. EXAM: CHEST  2 VIEW COMPARISON:  02/03/2012 FINDINGS: Low volume film. Cardiopericardial silhouette is at upper limits of normal for size. There is pulmonary vascular congestion without overt pulmonary edema. No focal airspace consolidation or pleural effusion. Probable small hiatal hernia. The visualized bony structures of the thorax are intact. Telemetry leads overlie the chest. IMPRESSION: Borderline cardiomegaly with vascular congestion. Electronically Signed   By: Misty Stanley M.D.   On: 07/16/2016 13:53   Dg Chest Port 1 View  Result Date: 08/03/2016 CLINICAL DATA:  AFib EXAM: PORTABLE CHEST 1 VIEW COMPARISON:  07/16/2016 FINDINGS: Increased interstitial markings. No frank interstitial edema. Mild left basilar opacity, possibly atelectasis. No pleural effusion or pneumothorax. Cardiomegaly. IMPRESSION: Increased interstitial markings without frank interstitial edema. Mild left basilar opacity, possibly atelectasis. Electronically Signed   By: Julian Hy M.D.   On: 08/03/2016 11:45    ASSESSMENT AND PLAN  1. Persistent afib now with RVR. He is very symptomatic when his rate gets high. Continue Xarelto for anticoagulation. Placed on IV cardizem without improvement of rate. S/p unsuccessful DCCV. No thrombus on TEE. No now IV amiodarone load. On IV Cardizem. Tentatively plan for DCCV Thursday.   2. 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 EF of 30-35 on TEE. Diuresed negative 3.2. Continue Lasix 40mg  qd.   3. ASCAD  s/p PCI of the LAD x 3 in 2006 - he has not had any chest pain. Continue Plavix - no ASA due to DOAC.   4. HTN -Stable. Continue ACE I  5. CKD stage 3. Creatinine around baseline.   6.  Hypokalemia - Resolved  Pixie Casino, MD, [facility] Attending Cardiologist [place]

## 2016-08-08 ENCOUNTER — Encounter (HOSPITAL_COMMUNITY)
Admission: EM | Disposition: A | Discharge status: Home / Self Care | Payer: Self-pay | Source: Home / Self Care | Attending: Internal Medicine

## 2016-08-08 ENCOUNTER — Inpatient Hospital Stay (HOSPITAL_COMMUNITY): Payer: Medicare Other | Admitting: Anesthesiology

## 2016-08-08 DIAGNOSIS — I4891 Unspecified atrial fibrillation: Secondary | ICD-10-CM

## 2016-08-08 HISTORY — PX: CARDIOVERSION: SHX1299

## 2016-08-08 LAB — BASIC METABOLIC PANEL
Anion gap: 11 (ref 5–15)
BUN: 23 mg/dL — AB (ref 6–20)
CO2: 25 mmol/L (ref 22–32)
Calcium: 9.5 mg/dL (ref 8.9–10.3)
Chloride: 101 mmol/L (ref 101–111)
Creatinine, Ser: 1.54 mg/dL — ABNORMAL HIGH (ref 0.61–1.24)
GFR calc Af Amer: 51 mL/min — ABNORMAL LOW (ref >60)
GFR, EST NON AFRICAN AMERICAN: 44 mL/min — AB (ref >60)
GLUCOSE: 161 mg/dL — AB (ref 65–99)
POTASSIUM: 3.6 mmol/L (ref 3.5–5.1)
Sodium: 137 mmol/L (ref 135–145)

## 2016-08-08 SURGERY — CARDIOVERSION
Anesthesia: Monitor Anesthesia Care

## 2016-08-08 MED ORDER — LIDOCAINE 2% (20 MG/ML) 5 ML SYRINGE
INTRAMUSCULAR | Status: DC | PRN
  Administered 2016-08-08: 100 mg via INTRAVENOUS

## 2016-08-08 MED ORDER — PROPOFOL 10 MG/ML IV BOLUS
INTRAVENOUS | Status: DC | PRN
  Administered 2016-08-08 (×2): 20 mg via INTRAVENOUS
  Administered 2016-08-08: 50 mg via INTRAVENOUS

## 2016-08-08 NOTE — H&P (View-Only) (Signed)
Patient Name: Peter [name] Date of Encounter: 08/08/2016   SUBJECTIVE Remains in a-fib with HR in the 90-100's. Plan for cardioversion today at 2pm.  CURRENT MEDS . atorvastatin  40 mg Oral q morning - 10a  . clopidogrel  75 mg Oral Daily  . famotidine  20 mg Oral Daily  . fenofibrate  160 mg Oral Daily  . ferrous sulfate  325 mg Oral q morning - 10a  . furosemide  40 mg Oral Daily  . hydrocortisone   Topical QID  . lisinopril  10 mg Oral Daily  . pantoprazole  40 mg Oral Daily  . potassium chloride  20 mEq Oral Daily  . rivaroxaban  20 mg Oral Q breakfast  . sodium chloride flush  3 mL Intravenous Q12H  . sodium chloride flush  3 mL Intravenous Q12H  . sodium chloride flush  3 mL Intravenous Q12H    OBJECTIVE  Vitals:   08/07/16 1631 08/07/16 1951 08/07/16 2355 08/08/16 0500  BP: 130/90 124/81 119/76 119/86  Pulse: 88 76 70 (!) 103  Resp: 16 18 20 19   Temp: 98.9 F (37.2 C) 98.8 F (37.1 C) 97.7 F (36.5 C) 97.7 F (36.5 C)  TempSrc: Oral Oral Oral Oral  SpO2: 93% 97% 98% 96%  Weight:    219 lb 8 oz (99.6 kg)  Height:        Intake/Output Summary (Last 24 hours) at 08/08/16 0824 Last data filed at 08/08/16 0546  Gross per 24 hour  Intake           1240.8 ml  Output             1100 ml  Net            140.8 ml   Filed Weights   08/05/16 1013 08/06/16 0500 08/08/16 0500  Weight: 221 lb 8 oz (100.5 kg) 221 lb (100.2 kg) 219 lb 8 oz (99.6 kg)    PHYSICAL EXAM  General: Pleasant, NAD. Neuro: Alert and oriented X 3. Moves all extremities spontaneously. Psych: Normal affect. HEENT:  Normal  Neck: Supple without bruits or JVD. Lungs:  Resp regular and unlabored, CTA. Heart: Ir Ir tachycardic no s3, s4, or murmurs. Abdomen: Soft, non-tender, non-distended, BS + x 4.  Extremities: No clubbing, cyanosis or edema. DP/PT/Radials 2+ and equal bilaterally.  Accessory Clinical Findings  CBC No results for input(s): WBC, NEUTROABS, HGB, HCT, MCV, PLT in the  last 72 hours. Basic Metabolic Panel No results for input(s): NA, K, CL, CO2, GLUCOSE, BUN, CREATININE, CALCIUM, MG, PHOS in the last 72 hours. Liver Function Tests No results for input(s): AST, ALT, ALKPHOS, BILITOT, PROT, ALBUMIN in the last 72 hours. No results for input(s): LIPASE, AMYLASE in the last 72 hours. Cardiac Enzymes No results for input(s): CKTOTAL, CKMB, CKMBINDEX, TROPONINI in the last 72 hours. BNP Invalid input(s): POCBNP D-Dimer No results for input(s): DDIMER in the last 72 hours. Hemoglobin A1C No results for input(s): HGBA1C in the last 72 hours. Fasting Lipid Panel No results for input(s): CHOL, HDL, LDLCALC, TRIG, CHOLHDL, LDLDIRECT in the last 72 hours. Thyroid Function Tests No results for input(s): TSH, T4TOTAL, T3FREE, THYROIDAB in the last 72 hours.  Invalid input(s): FREET3  TELE  afib at rate of 90-100's   TEE  LV EF: 30% -   35%  ------------------------------------------------------------------- History:   PMH:   Atrial fibrillation.  ------------------------------------------------------------------- Study Conclusions  - Procedure narrative: Defibrillation. The rhythm could not be  converted from atrial fibrillation, despite application of 3   shocks. The final rhythm was atrial fibrillation. - Left ventricle: The cavity size was normal. Systolic function was   moderately to severely reduced. The estimated ejection fraction   was in the range of 30% to 35%. Moderate diffuse hypokinesis with   no identifiable regional variations. No evidence of thrombus. - Aortic valve: There was trivial regurgitation. - Mitral valve: There was mild regurgitation directed centrally. - Left atrium: The atrium was severely dilated. No evidence of   thrombus in the atrial cavity or appendage. No evidence of   thrombus in the atrial cavity or appendage. No evidence of   thrombus in the atrial cavity or appendage. There was   moderatecontinuous  spontaneous echo contrast (&quot;smoke&quot;) in the   cavity and the appendage. - Atrial septum: No defect or patent foramen ovale was identified. - Tricuspid valve: There was moderate regurgitation directed   centrally.  Impressions:  - Unsuccessful cardioversion. No cardiac source of emboli was   indentified.  Radiology/Studies  Dg Chest 2 View  Result Date: 07/16/2016 CLINICAL DATA:  Heart attack 3 years ago. Increasing shortness of breath and hard time getting a deep breath starting about 3 days ago. EXAM: CHEST  2 VIEW COMPARISON:  02/03/2012 FINDINGS: Low volume film. Cardiopericardial silhouette is at upper limits of normal for size. There is pulmonary vascular congestion without overt pulmonary edema. No focal airspace consolidation or pleural effusion. Probable small hiatal hernia. The visualized bony structures of the thorax are intact. Telemetry leads overlie the chest. IMPRESSION: Borderline cardiomegaly with vascular congestion. Electronically Signed   By: Misty Stanley M.D.   On: 07/16/2016 13:53   Dg Chest Port 1 View  Result Date: 08/03/2016 CLINICAL DATA:  AFib EXAM: PORTABLE CHEST 1 VIEW COMPARISON:  07/16/2016 FINDINGS: Increased interstitial markings. No frank interstitial edema. Mild left basilar opacity, possibly atelectasis. No pleural effusion or pneumothorax. Cardiomegaly. IMPRESSION: Increased interstitial markings without frank interstitial edema. Mild left basilar opacity, possibly atelectasis. Electronically Signed   By: Julian Hy M.D.   On: 08/03/2016 11:45    ASSESSMENT AND PLAN  1. Persistent afib now with RVR. He is very symptomatic when his rate gets high. Continue Xarelto for anticoagulation. Placed on IV cardizem without improvement of rate. S/p unsuccessful DCCV. No thrombus on TEE. No now IV amiodarone load. On IV Cardizem. Plan for DCCV today.  2. 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 EF of 30-35 on TEE. Diuresed negative 3.2. Continue Lasix 40mg  qd.   3. ASCAD s/p PCI of the LAD x 3 in 2006 - he has not had any chest pain. Continue Plavix - no ASA due to DOAC.   4. HTN -Stable. Continue ACE I  5. CKD stage 3. Creatinine around baseline. Recheck BMET today for procedure.   6.  Hypokalemia - recheck BMET.  Pixie Casino, MD, [facility] Attending Cardiologist [place]

## 2016-08-08 NOTE — Care Management Important Message (Signed)
Important Message  Patient Details  Name: Peter [name] MRN: [id number] Date of Birth: [birth year]   Medicare Important Message Given:  Yes    [name] [address] 08/08/2016, 3:11 PM

## 2016-08-08 NOTE — CV Procedure (Signed)
    Electrical Cardioversion Procedure Note Peter [name] ID:[id number] Jan 18, 1946  Procedure: Electrical Cardioversion Indications:  Atrial Fibrillation  Time Out: Verified patient identification, verified procedure,medications/allergies/relevent history reviewed, required imaging and test results available.  Performed  Procedure Details  The patient was NPO after midnight. Anesthesia was administered at the beside with  propofol.  Cardioversion was performed with synchronized biphasic defibrillation via AP pads with 120 joules.  1 attempt(s) were performed.  The patient converted to normal sinus rhythm. The patient tolerated the procedure well   IMPRESSION:  Successful cardioversion of atrial fibrillation. On amiodarone IV as well as diltiazem. EF reduced from normal to 35%.     Candee Furbish 08/08/2016, 2:31 PM

## 2016-08-08 NOTE — Progress Notes (Signed)
Patient Name: Peter [name] Date of Encounter: 08/08/2016   SUBJECTIVE Remains in a-fib with HR in the 90-100's. Plan for cardioversion today at 2pm.  CURRENT MEDS . atorvastatin  40 mg Oral q morning - 10a  . clopidogrel  75 mg Oral Daily  . famotidine  20 mg Oral Daily  . fenofibrate  160 mg Oral Daily  . ferrous sulfate  325 mg Oral q morning - 10a  . furosemide  40 mg Oral Daily  . hydrocortisone   Topical QID  . lisinopril  10 mg Oral Daily  . pantoprazole  40 mg Oral Daily  . potassium chloride  20 mEq Oral Daily  . rivaroxaban  20 mg Oral Q breakfast  . sodium chloride flush  3 mL Intravenous Q12H  . sodium chloride flush  3 mL Intravenous Q12H  . sodium chloride flush  3 mL Intravenous Q12H    OBJECTIVE  Vitals:   08/07/16 1631 08/07/16 1951 08/07/16 2355 08/08/16 0500  BP: 130/90 124/81 119/76 119/86  Pulse: 88 76 70 (!) 103  Resp: 16 18 20 19   Temp: 98.9 F (37.2 C) 98.8 F (37.1 C) 97.7 F (36.5 C) 97.7 F (36.5 C)  TempSrc: Oral Oral Oral Oral  SpO2: 93% 97% 98% 96%  Weight:    219 lb 8 oz (99.6 kg)  Height:        Intake/Output Summary (Last 24 hours) at 08/08/16 0824 Last data filed at 08/08/16 0546  Gross per 24 hour  Intake           1240.8 ml  Output             1100 ml  Net            140.8 ml   Filed Weights   08/05/16 1013 08/06/16 0500 08/08/16 0500  Weight: 221 lb 8 oz (100.5 kg) 221 lb (100.2 kg) 219 lb 8 oz (99.6 kg)    PHYSICAL EXAM  General: Pleasant, NAD. Neuro: Alert and oriented X 3. Moves all extremities spontaneously. Psych: Normal affect. HEENT:  Normal  Neck: Supple without bruits or JVD. Lungs:  Resp regular and unlabored, CTA. Heart: Ir Ir tachycardic no s3, s4, or murmurs. Abdomen: Soft, non-tender, non-distended, BS + x 4.  Extremities: No clubbing, cyanosis or edema. DP/PT/Radials 2+ and equal bilaterally.  Accessory Clinical Findings  CBC No results for input(s): WBC, NEUTROABS, HGB, HCT, MCV, PLT in the  last 72 hours. Basic Metabolic Panel No results for input(s): NA, K, CL, CO2, GLUCOSE, BUN, CREATININE, CALCIUM, MG, PHOS in the last 72 hours. Liver Function Tests No results for input(s): AST, ALT, ALKPHOS, BILITOT, PROT, ALBUMIN in the last 72 hours. No results for input(s): LIPASE, AMYLASE in the last 72 hours. Cardiac Enzymes No results for input(s): CKTOTAL, CKMB, CKMBINDEX, TROPONINI in the last 72 hours. BNP Invalid input(s): POCBNP D-Dimer No results for input(s): DDIMER in the last 72 hours. Hemoglobin A1C No results for input(s): HGBA1C in the last 72 hours. Fasting Lipid Panel No results for input(s): CHOL, HDL, LDLCALC, TRIG, CHOLHDL, LDLDIRECT in the last 72 hours. Thyroid Function Tests No results for input(s): TSH, T4TOTAL, T3FREE, THYROIDAB in the last 72 hours.  Invalid input(s): FREET3  TELE  afib at rate of 90-100's   TEE  LV EF: 30% -   35%  ------------------------------------------------------------------- History:   PMH:   Atrial fibrillation.  ------------------------------------------------------------------- Study Conclusions  - Procedure narrative: Defibrillation. The rhythm could not be  converted from atrial fibrillation, despite application of 3   shocks. The final rhythm was atrial fibrillation. - Left ventricle: The cavity size was normal. Systolic function was   moderately to severely reduced. The estimated ejection fraction   was in the range of 30% to 35%. Moderate diffuse hypokinesis with   no identifiable regional variations. No evidence of thrombus. - Aortic valve: There was trivial regurgitation. - Mitral valve: There was mild regurgitation directed centrally. - Left atrium: The atrium was severely dilated. No evidence of   thrombus in the atrial cavity or appendage. No evidence of   thrombus in the atrial cavity or appendage. No evidence of   thrombus in the atrial cavity or appendage. There was   moderatecontinuous  spontaneous echo contrast (&quot;smoke&quot;) in the   cavity and the appendage. - Atrial septum: No defect or patent foramen ovale was identified. - Tricuspid valve: There was moderate regurgitation directed   centrally.  Impressions:  - Unsuccessful cardioversion. No cardiac source of emboli was   indentified.  Radiology/Studies  Dg Chest 2 View  Result Date: 07/16/2016 CLINICAL DATA:  Heart attack 3 years ago. Increasing shortness of breath and hard time getting a deep breath starting about 3 days ago. EXAM: CHEST  2 VIEW COMPARISON:  02/03/2012 FINDINGS: Low volume film. Cardiopericardial silhouette is at upper limits of normal for size. There is pulmonary vascular congestion without overt pulmonary edema. No focal airspace consolidation or pleural effusion. Probable small hiatal hernia. The visualized bony structures of the thorax are intact. Telemetry leads overlie the chest. IMPRESSION: Borderline cardiomegaly with vascular congestion. Electronically Signed   By: Misty Stanley M.D.   On: 07/16/2016 13:53   Dg Chest Port 1 View  Result Date: 08/03/2016 CLINICAL DATA:  AFib EXAM: PORTABLE CHEST 1 VIEW COMPARISON:  07/16/2016 FINDINGS: Increased interstitial markings. No frank interstitial edema. Mild left basilar opacity, possibly atelectasis. No pleural effusion or pneumothorax. Cardiomegaly. IMPRESSION: Increased interstitial markings without frank interstitial edema. Mild left basilar opacity, possibly atelectasis. Electronically Signed   By: Julian Hy M.D.   On: 08/03/2016 11:45    ASSESSMENT AND PLAN  1. Persistent afib now with RVR. He is very symptomatic when his rate gets high. Continue Xarelto for anticoagulation. Placed on IV cardizem without improvement of rate. S/p unsuccessful DCCV. No thrombus on TEE. No now IV amiodarone load. On IV Cardizem. Plan for DCCV today.  2. 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 EF of 30-35 on TEE. Diuresed negative 3.2. Continue Lasix 40mg  qd.   3. ASCAD s/p PCI of the LAD x 3 in 2006 - he has not had any chest pain. Continue Plavix - no ASA due to DOAC.   4. HTN -Stable. Continue ACE I  5. CKD stage 3. Creatinine around baseline. Recheck BMET today for procedure.   6.  Hypokalemia - recheck BMET.  Pixie Casino, MD, [facility] Attending Cardiologist [place]

## 2016-08-08 NOTE — Interval H&P Note (Signed)
History and Physical Interval Note:  08/08/2016 2:12 PM  Peter [name]  has presented today for surgery, with the diagnosis of AFIB  The various methods of treatment have been discussed with the patient and family. After consideration of risks, benefits and other options for treatment, the patient has consented to  Procedure(s): CARDIOVERSION (N/A) as a surgical intervention .  The patient's history has been reviewed, patient examined, no change in status, stable for surgery.  I have reviewed the patient's chart and labs.  Questions were answered to the patient's satisfaction.     [employer]

## 2016-08-08 NOTE — Care Management Note (Signed)
Case Management Note  Patient Details  Name: CEJAY HOEKSEMA MRN: [id number] Date of Birth: [birth year]  Subjective/Objective:  Pt presented for Shortness of breath & Afib. Pt initiated on Cardizem gtt. IV Amio load on 08-07-16. Cardioversion 08-08-16 Successful.                 Action/Plan: CM will continue to monitor for additional needs.   Expected Discharge Date:                  Expected Discharge Plan:  Home/Self Care  In-House Referral:  NA  Discharge planning Services  CM Consult  Post Acute Care Choice:    Choice offered to:     DME Arranged:    DME Agency:     HH Arranged:    HH Agency:     Status of Service:  In process, will continue to follow  If discussed at Long Length of Stay Meetings, dates discussed:    Additional Comments:  Bethena Roys, RN 08/08/2016, 3:12 PM

## 2016-08-08 NOTE — Anesthesia Preprocedure Evaluation (Addendum)
Anesthesia Evaluation  Patient identified by MRN, date of birth, ID band Patient awake    Reviewed: Allergy & Precautions, NPO status , Patient's Chart, lab work & pertinent test results  History of Anesthesia Complications Negative for: history of anesthetic complications  Airway Mallampati: III  TM Distance: >3 FB Neck ROM: Full    Dental  (+) Teeth Intact, Dental Advisory Given   Pulmonary neg pulmonary ROS,    Pulmonary exam normal breath sounds clear to auscultation       Cardiovascular hypertension, Pt. 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 Moderate to severely dilated left atrium with spontaneous echo contrast, but without thrombus. Normal appendage emptying velocities. 2+ MR 3+ TR Normal AV and PV.  Myocardial Perfusion Imaging 01/06/2013: normal   Neuro/Psych negative neurological ROS     GI/Hepatic Neg liver ROS, GERD  Medicated and Controlled,  Endo/Other  negative endocrine ROS  Renal/GU CRFRenal disease     Musculoskeletal   Abdominal   Peds  Hematology  (+) Blood dyscrasia (iron deficiency), anemia ,   Anesthesia Other Findings   Reproductive/Obstetrics                            Anesthesia Physical  Anesthesia Plan  ASA: IV  Anesthesia Plan: MAC   Post-op Pain Management:    Induction: Intravenous  Airway Management Planned: Natural Airway and Nasal Cannula  Additional Equipment:   Intra-op Plan:   Post-operative Plan:   Informed Consent: I have reviewed the patients History and Physical, chart, labs and discussed the procedure including the risks, benefits and alternatives for the proposed anesthesia with the patient or authorized  representative who has indicated his/her understanding and acceptance.   Dental advisory given  Plan Discussed with: CRNA  Anesthesia Plan Comments:        Anesthesia Quick Evaluation

## 2016-08-08 NOTE — Anesthesia Procedure Notes (Signed)
Procedure Name: MAC Date/Time: 08/08/2016 1:53 PM Performed by: Mervyn Gay Pre-anesthesia Checklist: Patient identified, Patient being monitored, Timeout performed, Emergency Drugs available and Suction available Patient Re-evaluated:Patient Re-evaluated prior to inductionOxygen Delivery Method: Simple face mask, Nasal cannula and Ambu bag Preoxygenation: Pre-oxygenation with 100% oxygen Number of attempts: 1 Placement Confirmation: positive ETCO2 Dental Injury: Teeth and Oropharynx as per pre-operative assessment

## 2016-08-08 NOTE — Transfer of Care (Signed)
Immediate Anesthesia Transfer of Care Note  Patient: Peter [name]  Procedure(s) Performed: Procedure(s): CARDIOVERSION (N/A)  Patient Location: Endoscopy Unit  Anesthesia Type:MAC  Level of Consciousness: awake, alert , oriented and patient cooperative  Airway & Oxygen Therapy: Patient Spontanous Breathing and Patient connected to nasal cannula oxygen  Post-op Assessment: Report given to RN, Post -op Vital signs reviewed and stable and Patient moving all extremities X 4  Post vital signs: Reviewed and stable  Last Vitals:  Vitals:   08/08/16 1426 08/08/16 1427  BP:    Pulse: 85 79  Resp: (!) 22 (!) 25  Temp:      Last Pain:  Vitals:   08/08/16 1122  TempSrc: Oral  PainSc:       Patients Stated Pain Goal: 0 ([id number])  Complications: No apparent anesthesia complications

## 2016-08-09 ENCOUNTER — Encounter (HOSPITAL_COMMUNITY): Payer: Self-pay | Admitting: Physician Assistant

## 2016-08-09 ENCOUNTER — Other Ambulatory Visit: Payer: Self-pay | Admitting: Physician Assistant

## 2016-08-09 ENCOUNTER — Telehealth: Payer: Self-pay | Admitting: Cardiology

## 2016-08-09 DIAGNOSIS — E669 Obesity, unspecified: Secondary | ICD-10-CM

## 2016-08-09 MED ORDER — AMIODARONE HCL 200 MG PO TABS
400.0000 mg | ORAL_TABLET | Freq: Every day | ORAL | Status: DC
  Administered 2016-08-09: 400 mg via ORAL
  Filled 2016-08-09: qty 2

## 2016-08-09 MED ORDER — AMIODARONE HCL 200 MG PO TABS: 400.0000 mg | ORAL_TABLET | Freq: Every day | ORAL | 1 refills | Status: DC

## 2016-08-09 MED ORDER — CARVEDILOL 3.125 MG PO TABS: 3.1250 mg | ORAL_TABLET | Freq: Two times a day (BID) | ORAL | Status: DC

## 2016-08-09 MED ORDER — CARVEDILOL 3.125 MG PO TABS
3.1250 mg | ORAL_TABLET | Freq: Two times a day (BID) | ORAL | Status: DC
  Administered 2016-08-09: 3.125 mg via ORAL
  Filled 2016-08-09: qty 1

## 2016-08-09 MED ORDER — LIVING BETTER WITH HEART FAILURE BOOK: Freq: Once | Status: DC

## 2016-08-09 MED ORDER — RIVAROXABAN 20 MG PO TABS: 20.0000 mg | ORAL_TABLET | Freq: Every day | ORAL | 11 refills | Status: DC

## 2016-08-09 MED ORDER — CARVEDILOL 3.125 MG PO TABS: 3.1250 mg | ORAL_TABLET | Freq: Two times a day (BID) | ORAL | 3 refills | Status: DC

## 2016-08-09 MED ORDER — FUROSEMIDE 40 MG PO TABS: 40.0000 mg | ORAL_TABLET | Freq: Every day | ORAL | 3 refills | Status: DC

## 2016-08-09 MED ORDER — POTASSIUM CHLORIDE CRYS ER 20 MEQ PO TBCR: 20.0000 meq | EXTENDED_RELEASE_TABLET | Freq: Every day | ORAL | 3 refills | Status: DC

## 2016-08-09 NOTE — Discharge Summary (Signed)
Discharge Summary    Patient ID: Peter [name],  MRN: [id number], DOB/AGE: August 05, 1946 70 y.o.  Admit date: 08/03/2016 Discharge date: 08/09/2016  Primary Care Provider: [name] Primary Cardiologist: [name]  Discharge Diagnoses    Principal Problem:   Atrial fibrillation with RVR [facility]) Active Problems:   Persistent atrial fibrillation (HCC)   Acute systolic (congestive) heart failure (HCC)   Hypertension   Coronary artery disease due to lipid rich plaque   Dyslipidemia   DOE (dyspnea on exertion)   CKD (chronic kidney disease), stage III   Obesity  Diagnostic Studies/Procedures    1. TEE/DCCV as below, and then repeat DCCV  _____________   History of Present Illness & Hospital Course    CAD s/p PCI to LADx3 in 2006, previously chronic diastolic CHF, dyslipidemia, GERD, h/o GIB, HTN, hypertriglyceridemia, iron deficiency anemia, obesity, CKD stage III, hypokalemia, recent diagnosed afib who was admitted with persistent afib (CHADSVASC 4) and heart failure. He was hospitalized earlier this month for new onset atrial fibrillation/acute on chronic diastolic heart failure. He was started on Xarelto as well as Cardizem. He was seen in office 07/25/16 noted to have rapid rate but heart rate was in the 80's on vitals and Cardizem increased to 240 mg daily. The plan was to follow-up in 3 weeks and plan for DCCV if needed. His weight was 224 pounds at that visit. He presented to [facility] on 08/03/2016 with worsening SOB, orthopnea, and afib RVR. His BNP was elevated. CXR showed volume overload. He was admitted and treated with IV diuretics for heart failure and rate control. When he was able to lie flat, he underwent TEE on 08/05/16 showing EF 30-35% (down from 55% on 07/17/16) with mod TR, mild MR - cardioversion was attempted but despite application of 3 shocks he remained in atrial fib. Antiarrhythmic therapy was recommended and he was started on amiodarone (AAD options limited given his  CAD/LVD). He underwent repeat attempt at conversion of atrial fib on 08/08/16 which was successful. Amiodarone IV was changed to oral amiodarone 400mg  daily. Diltiazem was discontinued given his LV dysfunction and he was placed on coreg 3.125mg  BID. He will go home on Lasix 40mg  daily (increased from 20) and new potassium. Dr. Debara Pickett recommends close OP f/u/TOC visit - will need BMET ordered at that visit. I did not order this in case other labs are needed at that visit to avoid the patient being stuck twice. Dr. Debara Pickett recommended to consider dc of fenofibrate - his note states "recent guidelines suggest little evidence to support concomitant use with statins." He will need reassessment of his LV function 3 months after regimen is optimized to determine if he needs either ICD. If any recurrent sx while in sinus could consider updating ischemic eval although he has not had any recent chest pain per Dr. Debara Pickett. Regardiog amio, baseline TSH wnl 08/03/16 and LFTs normal 07/17/16. Further monitoring of organ systems while on amiodarone will be at discretion of primary cardiologist. I sent in refills for Xarelto. DC wt 218lb, DC Cr 1.54. It also appears the patient was referred somehow to afib clinic which must be pending at this time. The afib clinic may be able to help assist with recommendations of amiodarone dose adjustment. He was also advised to f/u PCP for intermittent elevated CBG.  Of note Mr. Archie kidney function will need to be monitored closely in the context of Xarelto use. He currently qualifies for Xarelto 20mg  dose but if his Cr worsens may  need dose adjustment - as above will need BMET ordered by [facility] provider.  _____________  Discharge Vitals Blood pressure 121/77, pulse (!) 59, temperature 97.6 F (36.4 C), resp. rate 16, height 5\' 8"  (1.727 m), weight 218 lb 9.6 oz (99.2 kg), SpO2 95 %.  Filed Weights   08/06/16 0500 08/08/16 0500 08/09/16 0500  Weight: 221 lb (100.2 kg) 219 lb 8 oz (99.6 kg)  218 lb 9.6 oz (99.2 kg)    Labs & Radiologic Studies    Basic Metabolic Panel  Recent Labs  08/08/16 0836  NA 137  K 3.6  CL 101  CO2 25  GLUCOSE 161*  BUN 23*  CREATININE 1.54*  CALCIUM 9.5  _____________  Dg Chest 2 View  Result Date: 07/16/2016 CLINICAL DATA:  Heart attack 3 years ago. Increasing shortness of breath and hard time getting a deep breath starting about 3 days ago. EXAM: CHEST  2 VIEW COMPARISON:  02/03/2012 FINDINGS: Low volume film. Cardiopericardial silhouette is at upper limits of normal for size. There is pulmonary vascular congestion without overt pulmonary edema. No focal airspace consolidation or pleural effusion. Probable small hiatal hernia. The visualized bony structures of the thorax are intact. Telemetry leads overlie the chest. IMPRESSION: Borderline cardiomegaly with vascular congestion. Electronically Signed   By: Misty Stanley M.D.   On: 07/16/2016 13:53   Dg Chest Port 1 View  Result Date: 08/03/2016 CLINICAL DATA:  AFib EXAM: PORTABLE CHEST 1 VIEW COMPARISON:  07/16/2016 FINDINGS: Increased interstitial markings. No frank interstitial edema. Mild left basilar opacity, possibly atelectasis. No pleural effusion or pneumothorax. Cardiomegaly. IMPRESSION: Increased interstitial markings without frank interstitial edema. Mild left basilar opacity, possibly atelectasis. Electronically Signed   By: Julian Hy M.D.   On: 08/03/2016 11:45   Disposition   Pt is being discharged home today in good condition.  Follow-up Plans & Appointments    Follow-up Information    Angelena Form, PA-C .   Specialties:  Cardiology, Radiology Why:  Follow up as listed below - Nell Range is one of the PAs that works with [name]. Contact information: [address] [place] [address] [phone]          Discharge Instructions    Amb referral to AFIB Clinic    Complete by:  As directed    Diet - low sodium heart healthy     Complete by:  As directed    Increase activity slowly    Complete by:  As directed    Please notice several medicine changes. New medicines include potassium, carvedilol, and amiodarone. Your furosemide (fluid pill/Lasix) is a new dose size/pill. Your diltiazem was STOPPED. We sent in refills of your Xarelto.  Patients on amiodarone may need intermittent check-ups of other organ systems, such as lungs, thyroid, eyes, and liver function. Please talk to your doctor at your follow-up appointments about what monitoring may be needed for you. Your dose may be adjusted in your subsequent follow-up visits.      Discharge Medications     Medication List    STOP taking these medications   diltiazem 240 MG 24 hr capsule Commonly known as:  CARDIZEM CD     TAKE these medications   amiodarone 200 MG tablet Commonly known as:  PACERONE Take 2 tablets (400 mg total) by mouth daily. Start taking on:  08/10/2016   atorvastatin 40 MG tablet Commonly known as:  LIPITOR Take 1 tablet (40 mg total) by mouth every morning.  carvedilol 3.125 MG tablet Commonly known as:  COREG Take 1 tablet (3.125 mg total) by mouth 2 (two) times daily with a meal.   clopidogrel 75 MG tablet Commonly known as:  PLAVIX Take 75 mg by mouth daily.   diphenhydramine-acetaminophen 25-500 MG Tabs tablet Commonly known as:  TYLENOL PM Take 1 tablet by mouth at bedtime.   fenofibrate 145 MG tablet Commonly known as:  TRICOR Take 145 mg by mouth daily.   ferrous sulfate 325 (65 FE) MG tablet Take 325 mg by mouth every morning.   furosemide 40 MG tablet Commonly known as:  LASIX Take 1 tablet (40 mg total) by mouth daily. What changed:  medication strength  how much to take   lisinopril 10 MG tablet Commonly known as:  PRINIVIL,ZESTRIL Take 1 tablet (10 mg total) by mouth daily.   nitroGLYCERIN 0.4 MG SL tablet Commonly known as:  NITROSTAT Place 1 tablet (0.4 mg total) under the tongue every 5 (five)  minutes as needed for chest pain.   pantoprazole 40 MG tablet Commonly known as:  PROTONIX Take 40 mg by mouth daily.   potassium chloride SA 20 MEQ tablet Commonly known as:  K-DUR,KLOR-CON Take 1 tablet (20 mEq total) by mouth daily. Start taking on:  08/10/2016   rivaroxaban 20 MG Tabs tablet Commonly known as:  XARELTO Take 1 tablet (20 mg total) by mouth daily with supper. What changed:  when to take this        Allergies:  No Known Allergies   Outstanding Labs/Studies   Will need BMET  Duration of Discharge Encounter   Greater than 30 minutes including physician time.  Signed, Charlie Pitter PA-C 08/09/2016, 12:07 PM

## 2016-08-09 NOTE — Telephone Encounter (Signed)
New message   appt on 10.5.2017 @ 2:30 pm   Pt needs TOC call, being discharged today.   Thanks  Trisha

## 2016-08-09 NOTE — Progress Notes (Addendum)
Patient Name: Peter [name] Date of Encounter: 08/09/2016   SUBJECTIVE Successful DCCV yesterday to NSR. Remains in sinus today. No complaints, ready to go home.  CURRENT MEDS . atorvastatin  40 mg Oral q morning - 10a  . clopidogrel  75 mg Oral Daily  . famotidine  20 mg Oral Daily  . fenofibrate  160 mg Oral Daily  . ferrous sulfate  325 mg Oral q morning - 10a  . furosemide  40 mg Oral Daily  . hydrocortisone   Topical QID  . lisinopril  10 mg Oral Daily  . pantoprazole  40 mg Oral Daily  . potassium chloride  20 mEq Oral Daily  . rivaroxaban  20 mg Oral Q breakfast  . sodium chloride flush  3 mL Intravenous Q12H  . sodium chloride flush  3 mL Intravenous Q12H  . sodium chloride flush  3 mL Intravenous Q12H    OBJECTIVE  Vitals:   08/08/16 1511 08/08/16 2100 08/09/16 0500 08/09/16 0700  BP: 133/89 121/74 121/77   Pulse: 75 70 63 (!) 59  Resp: 20 18 (!) 21 16  Temp: 97.9 F (36.6 C) 97.8 F (36.6 C) 97.6 F (36.4 C)   TempSrc: Oral     SpO2: 97% 97% 98% 95%  Weight:   218 lb 9.6 oz (99.2 kg)   Height:        Intake/Output Summary (Last 24 hours) at 08/09/16 0914 Last data filed at 08/09/16 0600  Gross per 24 hour  Intake            954.2 ml  Output             1250 ml  Net           -295.8 ml   Filed Weights   08/06/16 0500 08/08/16 0500 08/09/16 0500  Weight: 221 lb (100.2 kg) 219 lb 8 oz (99.6 kg) 218 lb 9.6 oz (99.2 kg)    PHYSICAL EXAM  General: Pleasant, NAD. Neuro: Alert and oriented X 3. Moves all extremities spontaneously. Psych: Normal affect. HEENT:  Normal  Neck: Supple without bruits or JVD. Lungs:  Resp regular and unlabored, CTA. Heart: RRR, no murmurs Abdomen: Soft, non-tender, non-distended, BS + x 4.  Extremities: No clubbing, cyanosis or edema. DP/PT/Radials 2+ and equal bilaterally.  Accessory Clinical Findings  CBC No results for input(s): WBC, NEUTROABS, HGB, HCT, MCV, PLT in the last 72 hours. Basic Metabolic  Panel  Recent Labs  08/08/16 0836  NA 137  K 3.6  CL 101  CO2 25  GLUCOSE 161*  BUN 23*  CREATININE 1.54*  CALCIUM 9.5   Liver Function Tests No results for input(s): AST, ALT, ALKPHOS, BILITOT, PROT, ALBUMIN in the last 72 hours. No results for input(s): LIPASE, AMYLASE in the last 72 hours. Cardiac Enzymes No results for input(s): CKTOTAL, CKMB, CKMBINDEX, TROPONINI in the last 72 hours. BNP Invalid input(s): POCBNP D-Dimer No results for input(s): DDIMER in the last 72 hours. Hemoglobin A1C No results for input(s): HGBA1C in the last 72 hours. Fasting Lipid Panel No results for input(s): CHOL, HDL, LDLCALC, TRIG, CHOLHDL, LDLDIRECT in the last 72 hours. Thyroid Function Tests No results for input(s): TSH, T4TOTAL, T3FREE, THYROIDAB in the last 72 hours.  Invalid input(s): FREET3  TELE  Sinus in the 60's   TEE  LV EF: 30% -   35%  ------------------------------------------------------------------- History:   PMH:   Atrial fibrillation.  ------------------------------------------------------------------- Study Conclusions  - Procedure narrative: Defibrillation. The  rhythm could not be   converted from atrial fibrillation, despite application of 3   shocks. The final rhythm was atrial fibrillation. - Left ventricle: The cavity size was normal. Systolic function was   moderately to severely reduced. The estimated ejection fraction   was in the range of 30% to 35%. Moderate diffuse hypokinesis with   no identifiable regional variations. No evidence of thrombus. - Aortic valve: There was trivial regurgitation. - Mitral valve: There was mild regurgitation directed centrally. - Left atrium: The atrium was severely dilated. No evidence of   thrombus in the atrial cavity or appendage. No evidence of   thrombus in the atrial cavity or appendage. No evidence of   thrombus in the atrial cavity or appendage. There was   moderatecontinuous spontaneous echo contrast  (&quot;smoke&quot;) in the   cavity and the appendage. - Atrial septum: No defect or patent foramen ovale was identified. - Tricuspid valve: There was moderate regurgitation directed   centrally.  Impressions:  - Unsuccessful cardioversion. No cardiac source of emboli was   indentified.  Radiology/Studies  Dg Chest 2 View  Result Date: 07/16/2016 CLINICAL DATA:  Heart attack 3 years ago. Increasing shortness of breath and hard time getting a deep breath starting about 3 days ago. EXAM: CHEST  2 VIEW COMPARISON:  02/03/2012 FINDINGS: Low volume film. Cardiopericardial silhouette is at upper limits of normal for size. There is pulmonary vascular congestion without overt pulmonary edema. No focal airspace consolidation or pleural effusion. Probable small hiatal hernia. The visualized bony structures of the thorax are intact. Telemetry leads overlie the chest. IMPRESSION: Borderline cardiomegaly with vascular congestion. Electronically Signed   By: Misty Stanley M.D.   On: 07/16/2016 13:53   Dg Chest Port 1 View  Result Date: 08/03/2016 CLINICAL DATA:  AFib EXAM: PORTABLE CHEST 1 VIEW COMPARISON:  07/16/2016 FINDINGS: Increased interstitial markings. No frank interstitial edema. Mild left basilar opacity, possibly atelectasis. No pleural effusion or pneumothorax. Cardiomegaly. IMPRESSION: Increased interstitial markings without frank interstitial edema. Mild left basilar opacity, possibly atelectasis. Electronically Signed   By: Julian Hy M.D.   On: 08/03/2016 11:45    ASSESSMENT AND PLAN  1. Persistent afib - no in sinus with successful DC CV yesterday on amiodarone. DC IV amiodarone and transition to oral amiodarone 400 mg daily. D/C diltiazem - start carvedilol 3.125 mg BID for CHF/rate control.  2. 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 EF of 30-35% on TEE.  Continue Lasix 40mg  qd. Add carvedilol 3.125 mg twice a  day.  3. ASCAD s/p PCI of the LAD x 3 in 2006 - he has not had any chest pain. Continue Plavix - no ASA due to DOAC. Adding low-dose beta blocker  4. HTN -Stable. Continue ACE I and add low-dose beta blocker.  5. CKD stage 3. Creatinine around baseline.    6.  Hypokalemia - resolved.  Okay for discharge today. Follow-up with mid-level provider or [name] in 5-7 days for [facility] visit given LVEF 30-35% and higher risk of re-hospitalization. Will need follow-up outpatient BMET next week to monitor creatinine and potassium on Lasix. Consider d/c fenofibrate - recent guidelines suggest little evidence to support concomitant use with statins.  Pixie Casino, MD, [facility] Attending Cardiologist [place]

## 2016-08-09 NOTE — Progress Notes (Signed)
Discharge instructions given and reviewed. No questions or concerns. Discharged home with wife .IV removed.

## 2016-08-12 NOTE — Telephone Encounter (Signed)
Patient contacted regarding discharge from [facility] hospital on 08/09/16.  Patient understands to follow up with provider on 08/15/16 at 2:30 pm at [facility] office.  Patient understands discharge instructions?  Yes  Patient understands medications and regiment?  Yes  Patient understands to bring all medications to this visit?  Yes  Other details:

## 2016-08-12 NOTE — Anesthesia Postprocedure Evaluation (Signed)
Anesthesia Post Note  Patient: Peter Dredge  Procedure(s) Performed: Procedure(s) (LRB): CARDIOVERSION (N/A)  Patient location during evaluation: PACU Anesthesia Type: MAC Level of consciousness: awake and alert Pain management: pain level controlled Vital Signs Assessment: post-procedure vital signs reviewed and stable Respiratory status: spontaneous breathing, nonlabored ventilation, respiratory function stable and patient connected to nasal cannula oxygen Cardiovascular status: stable and blood pressure returned to baseline Anesthetic complications: no    Last Vitals:  Vitals:   08/09/16 0500 08/09/16 0700  BP: 121/77   Pulse: 63 (!) 59  Resp: (!) 21 16  Temp: 36.4 C     Last Pain:  Vitals:   08/08/16 2100  TempSrc:   PainSc: 0-No pain                 Reginal Lutes

## 2016-08-14 NOTE — Progress Notes (Signed)
Cardiology Office Note    Date:  08/15/2016   ID:  Peter [name], DOB 06/15/1946, MRN [id number]  PCP:  Peter Amel, MD  Cardiologist:  Dr. Marlou [name]  CC: post hospital follow up   History of Present Illness:  Peter [name] is a 70 y.o. male with a history of CAD s/p PCI to LADx3 in 2006, previously chronic diastolic CHF, dyslipidemia, GERD, h/o GIB, HTN, hypertriglyceridemia, iron deficiency anemia, obesity, CKD stage III, recently diagnosed afib who was admitted with persistent afib (CHADSVASC 4) and heart failure who presents to clinic for post hospital follow up.   Admitted to [facility] from 9/23-9/29/17. He was hospitalized early in 07/2016 for new onset atrial fibrillation/acute on chronic diastolic heart failure. He was started on Xarelto as well as Cardizem. He was seen in office 07/25/16 noted to haverapid rate but heart rate was in the 80's on vitals and Cardizem increased to 240 mg daily. The plan was to follow-up in 3 weeks and plan for DCCV if needed. His weight was 224 pounds at that visit. He presented to [facility] on 08/03/2016 with worsening SOB, orthopnea, and afib RVR. His BNP was elevated. CXR showed volume overload. He was admitted and treated with IV diuretics for heart failure and rate control. When he was able to lie flat, he underwent TEE on 08/05/16 showing EF 30-35% (down from 55% on 07/17/16) with mod TR, mild MR - cardioversion was attempted but despite application of 3 shocks he remained in atrial fib. Antiarrhythmic therapy was recommended and he was started on amiodarone (AAD options limited given his CAD/LVD). He underwent repeat attempt at conversion of atrial fib on 08/08/16 which was successful. Amiodarone IV was changed to oral amiodarone 400mg  daily. Diltiazem was discontinued given his LV dysfunction and he was placed on coreg 3.125mg  BID. He will go home on Lasix 40mg  daily (increased from 20) and new potassium.  Today he presents to clinic for post hospital follow up. He  felt good until today he started to feel tired again. No energy. Upset today because ECG shows afib again. Just feels weak. Does not tolerate being in afib at all. No chest [name] or SOB. No LE edema, orthopnea or PND. No dizziness or syncope. No blood in stool or urine.    Past Medical History:  Diagnosis Date  . At risk for sleep apnea    STOP-BANG= 5       SENT TO PCP 07-12-2015  . CAD (coronary artery disease)    a. s/p PCI with DES x 3 to LAD in 2006 with bifurcation lesion and Plavix indefinitely was recommended.  . Chronic combined systolic and diastolic CHF (congestive heart failure) ([place])    a. Prior EF normal but EF dropped to 30-35% in 07/2016 in setting of AFIB.  . CKD (chronic kidney disease), stage III   . Dyslipidemia   . Dyspnea on exertion   . GERD (gastroesophageal reflux disease)   . History of GI bleed   . History of kidney stones   . History of MI (myocardial infarction)    11-05-2005--  periprocedural MI  (cardiac cath) per dr Peter [name] note  . Hypertension   . Hypertriglyceridemia   . Iron deficiency anemia   . Persistent atrial fibrillation ([place])    a. 07/2016, started on Eliquis -> readmitted later that month for AF RVR/acute HF - initially unsuccessful DCCV following TEE, so loaded with amio with repeat successful DCCV 07/2816.  . Right ureteral stone   .  S/P drug eluting coronary stent placement    x3  to LAD  2006    Past Surgical History:  Procedure Laterality Date  . CARDIOVASCULAR STRESS TEST  01-06-2013  dr Peter [name]   normal perfusion study/  no ischemia or infarct/  normal LV function and wall motion , ef 57%  . CARDIOVERSION N/A 08/05/2016   Procedure: CARDIOVERSION;  Surgeon: Peter Klein, MD;  Location: [place];  Service: Cardiovascular;  Laterality: N/A;  . CARDIOVERSION N/A 08/08/2016   Procedure: CARDIOVERSION;  Surgeon: Peter Pain, MD;  Location: [facility] ENDOSCOPY;  Service: Cardiovascular;  Laterality: N/A;  . CORONARY ANGIOPLASTY  11-13-2005 dr  Peter [name]   Successful touch up post-dilatation of the 3 tandem overlying Cypher stents in proximal to mid LAD (based on IVUS 0% stenosis and patent diagonal branch)/  Cutting Balloon Angioplasty of Ramus branch  . CORONARY ANGIOPLASTY WITH STENT PLACEMENT  11-05-2005   dr Peter [name]   PCI and DES x3 to birfurcation lesion LAD and diagonal branch of LAD/   20% dLM,  30-40% RCA, ef 60%  . CYSTOSCOPY WITH RETROGRADE PYELOGRAM, URETEROSCOPY AND STENT PLACEMENT Right 07/14/2015   Procedure: CYSTOSCOPY WITH RETROGRADE PYELOGRAM, URETEROSCOPY AND STENT PLACEMENT;  Surgeon: Peter Frock, MD;  Location: [facility];  Service: Urology;  Laterality: Right;  . EXTRACORPOREAL SHOCK WAVE LITHOTRIPSY  yrs ago  . LEFT URETEROSCOPIC STONE EXTRACTION  04-10-2006  . STONE EXTRACTION WITH BASKET Right 07/14/2015   Procedure: STONE EXTRACTION WITH BASKET;  Surgeon: Peter Frock, MD;  Location: [facility];  Service: Urology;  Laterality: Right;  . TEE WITHOUT CARDIOVERSION N/A 08/05/2016   Procedure: TRANSESOPHAGEAL ECHOCARDIOGRAM (TEE);  Surgeon: Peter Klein, MD;  Location: [facility] ENDOSCOPY;  Service: Cardiovascular;  Laterality: N/A;  . TRANSTHORACIC ECHOCARDIOGRAM  02-04-2012   grade I diastolic dysfunction/  ef 55-60%/  trivial MR and TR/  mild LAE    Current Medications: Outpatient Medications Prior to Visit  Medication Sig Dispense Refill  . atorvastatin (LIPITOR) 40 MG tablet Take 1 tablet (40 mg total) by mouth every morning. 90 tablet 3  . clopidogrel (PLAVIX) 75 MG tablet Take 75 mg by mouth daily.    . diphenhydramine-acetaminophen (TYLENOL PM) 25-500 MG TABS tablet Take 1 tablet by mouth at bedtime.    . fenofibrate (TRICOR) 145 MG tablet Take 145 mg by mouth daily.    . ferrous sulfate 325 (65 FE) MG tablet Take 325 mg by mouth every morning.     . furosemide (LASIX) 40 MG tablet Take 1 tablet (40 mg total) by mouth daily. 30 tablet 3  . lisinopril (PRINIVIL,ZESTRIL) 10  MG tablet Take 1 tablet (10 mg total) by mouth daily. 90 tablet 0  . nitroGLYCERIN (NITROSTAT) 0.4 MG SL tablet Place 1 tablet (0.4 mg total) under the tongue every 5 (five) minutes as needed for chest [name]. 25 tablet prn  . pantoprazole (PROTONIX) 40 MG tablet Take 40 mg by mouth daily.     . potassium chloride SA (K-DUR,KLOR-CON) 20 MEQ tablet Take 1 tablet (20 mEq total) by mouth daily. 30 tablet 3  . rivaroxaban (XARELTO) 20 MG TABS tablet Take 1 tablet (20 mg total) by mouth daily with supper. 30 tablet 11  . amiodarone (PACERONE) 200 MG tablet Take 2 tablets (400 mg total) by mouth daily. 60 tablet 1  . carvedilol (COREG) 3.125 MG tablet Take 1 tablet (3.125 mg total) by mouth 2 (two) times daily with a meal. 60 tablet 3  No facility-administered medications prior to visit.      Allergies:   Review of patient's allergies indicates no known allergies.   Social History   Social History  . Marital status: Married    Spouse name: N/A  . Number of children: N/A  . Years of education: N/A   Social History Main Topics  . Smoking status: Never Smoker  . Smokeless tobacco: Never Used  . Alcohol use No  . Drug use: No  . Sexual activity: Not Asked   Other Topics Concern  . None   Social History Narrative  . None     Family History:  The patient's family history includes Heart attack (age of onset: 74) in his father.     ROS:   Please see the history of present illness.    ROS All other systems reviewed and are negative.   PHYSICAL EXAM:   VS:  BP (!) 152/90   Pulse (!) 110   Ht 5' 7.75" (1.721 m)   Wt 222 lb (100.7 kg)   BMI 34.00 kg/m    GEN: Well nourished, well developed, in no acute distress  HEENT: normal  Neck: no JVD, carotid bruits, or masses Cardiac: irreg irreg; no murmurs, rubs, or gallops,no edema  Respiratory:  clear to auscultation bilaterally, normal work of breathing GI: soft, nontender, nondistended, + BS MS: no deformity or atrophy  Skin: warm  and dry, no rash Neuro:  Alert and Oriented x 3, Strength and sensation are intact Psych: euthymic mood, full affect  Wt Readings from Last 3 Encounters:  08/15/16 222 lb (100.7 kg)  08/09/16 218 lb 9.6 oz (99.2 kg)  07/25/16 224 lb (101.6 kg)      Studies/Labs Reviewed:   EKG:  EKG is ordered today.  The ekg ordered today demonstrates afib with RVR HR 110   Recent Labs: 07/17/2016: ALT 22 08/03/2016: B Natriuretic Peptide 414.8; TSH 0.871 08/04/2016: Hemoglobin 13.9; Platelets 178 08/05/2016: Magnesium 1.9 08/08/2016: BUN 23; Creatinine, Ser 1.54; Potassium 3.6; Sodium 137   Lipid Panel    Component Value Date/Time   CHOL 140 01/01/2013 1037   TRIG 230.0 (H) 01/01/2013 1037   HDL 38.20 (L) 01/01/2013 1037   CHOLHDL 4 01/01/2013 1037   VLDL 46.0 (H) 01/01/2013 1037   LDLDIRECT 68.6 01/01/2013 1037    Additional studies/ records that were reviewed today include:  2D ECHO: 07/17/2016 LV EF: 55% -   60% Study Conclusions - Left ventricle: The cavity size was normal. There was mild focal   basal hypertrophy of the septum. Systolic function was normal.   The estimated ejection fraction was in the range of 55% to 60%.   Wall motion was normal; there were no regional wall motion   abnormalities. The study was not technically sufficient to allow   evaluation of LV diastolic dysfunction due to atrial   fibrillation. - Mitral valve: Calcified annulus. - Left atrium: The atrium was mildly dilated. - Pulmonary arteries: PA peak pressure: 31 mm Hg (S).  2D ECHO: 08/05/2016 LV EF: 30% -   35% Study Conclusions - Procedure narrative: Defibrillation. The rhythm could not be   converted from atrial fibrillation, despite application of 3   shocks. The final rhythm was atrial fibrillation. - Left ventricle: The cavity size was normal. Systolic function was   moderately to severely reduced. The estimated ejection fraction   was in the range of 30% to 35%. Moderate diffuse hypokinesis  with   no identifiable regional variations.  No evidence of thrombus. - Aortic valve: There was trivial regurgitation. - Mitral valve: There was mild regurgitation directed centrally. - Left atrium: The atrium was severely dilated. No evidence of   thrombus in the atrial cavity or appendage. No evidence of   thrombus in the atrial cavity or appendage. No evidence of   thrombus in the atrial cavity or appendage. There was   moderatecontinuous spontaneous echo contrast (&quot;smoke&quot;) in the   cavity and the appendage. - Atrial septum: No defect or patent foramen ovale was identified. - Tricuspid valve: There was moderate regurgitation directed   centrally. Impressions: - Successful cardioversion. No cardiac source of emboli was   indentified.   ASSESSMENT & PLAN:   LLEWELLYN ORSINO is a 69 y.o. male with a history of CAD s/p PCI to LADx3 in 2006, previously chronic diastolic CHF, dyslipidemia, GERD, h/o GIB, HTN, hypertriglyceridemia, iron deficiency anemia, obesity, CKD stage III, hypokalemia, recent diagnosed afib who was admitted with persistent afib (CHADSVASC 4) and heart failure who presents to clinic for post hospital follow up.   Acute on chronic systolic CHF: DC wt [id number]. Today 222 lbs.  Appears euvolemic today -- TEE on 08/05/16 showing EF 30-35% (down from 55% on TTE 07/17/16) with mod TR, mild MR  -- Continue lasix 40mg  daily and Kdur 74mEq daily. BMET today -- Continue Coreg 3.125mg  BID and lisinopril 10mg  daily. Will increase Coreg to 6.25mg  BID  -- He will need reassessment of his LV function 3 months after regimen is optimized to determine if he needs ICD.  PAF s/p DCCV: discharged on amiodarone 400mg  daily and back in afib with RVR HR 110. BP also up. Will increase Coreg to 6.25mg  BID and increase amio to 400mg  BID. Will have him seen by EP next week to see if we need to try to perform DCCV after longer amio load or discuss ablation.  --  Regarding amio, baseline TSH wnl  08/03/16 and LFTs normal 07/17/16. Further monitoring of organ systems while on amiodarone will be at discretion of primary cardiologist. -- Continue Xarelto 20mg  daily for CHADSVASC of 4 (CHF, HTN, age, CAD). BMET today, if creat worsens he may need to be changed to 15mg  daily.   HTN: BP mildly elevated. Will increase Coreg as above  CKD: creat 1.54 on discharge. BMET today.  CAD: stable. No ASA given Xarelto use. Continue statin and BB.   HLD: continue statin and fibrate. Recent LDL at goal <70. Per Dr. Debara Pickett, no good evidence for fibrate use on top of statin therapy.   Medication Adjustments/Labs and Tests Ordered: Current medicines are reviewed at length with the patient today.  Concerns regarding medicines are outlined above.  Medication changes, Labs and Tests ordered today are listed in the Patient Instructions below. Patient Instructions  Medication Instructions:  Your physician has recommended you make the following change in your medication:  1.  INCREASE the Amiodarone 200 taking 2 tablets twice day 2.  INCREASE the Coreg to 6.25 taking 1 tablet twice a day   Labwork: TODAY:  BMET  Testing/Procedures: None ordered  Follow-Up: Your physician recommends that you schedule a follow-up appointment in: 08/19/16 WITH DR. [name] ARRIVING AT 1:45   Any Other Special Instructions Will Be Listed Below (If Applicable).   If you need a refill on your cardiac medications before your next appointment, please call your pharmacy.      Signed, Angelena Form, PA-C  08/15/2016 3:12 PM    [facility]  [address], [place], [place]  [address] Phone: [phone]; Fax: [phone]    

## 2016-08-15 ENCOUNTER — Encounter (INDEPENDENT_AMBULATORY_CARE_PROVIDER_SITE_OTHER): Payer: Self-pay

## 2016-08-15 ENCOUNTER — Encounter: Payer: Self-pay | Admitting: Physician Assistant

## 2016-08-15 ENCOUNTER — Ambulatory Visit (INDEPENDENT_AMBULATORY_CARE_PROVIDER_SITE_OTHER): Payer: Medicare Other | Admitting: Physician Assistant

## 2016-08-15 ENCOUNTER — Other Ambulatory Visit: Payer: Self-pay | Admitting: Physician Assistant

## 2016-08-15 VITALS — BP 152/90 | HR 110 | Ht 67.75 in | Wt 222.0 lb

## 2016-08-15 DIAGNOSIS — I5022 Chronic systolic (congestive) heart failure: Secondary | ICD-10-CM

## 2016-08-15 DIAGNOSIS — I10 Essential (primary) hypertension: Secondary | ICD-10-CM

## 2016-08-15 DIAGNOSIS — I4891 Unspecified atrial fibrillation: Secondary | ICD-10-CM | POA: Diagnosis not present

## 2016-08-15 DIAGNOSIS — I2510 Atherosclerotic heart disease of native coronary artery without angina pectoris: Secondary | ICD-10-CM

## 2016-08-15 DIAGNOSIS — N189 Chronic kidney disease, unspecified: Secondary | ICD-10-CM

## 2016-08-15 DIAGNOSIS — E785 Hyperlipidemia, unspecified: Secondary | ICD-10-CM

## 2016-08-15 MED ORDER — AMIODARONE HCL 200 MG PO TABS: 400.0000 mg | ORAL_TABLET | Freq: Two times a day (BID) | ORAL | 1 refills | Status: DC

## 2016-08-15 MED ORDER — CARVEDILOL 6.25 MG PO TABS: 6.2500 mg | ORAL_TABLET | Freq: Two times a day (BID) | ORAL | 1 refills | Status: DC

## 2016-08-15 NOTE — Patient Instructions (Addendum)
Medication Instructions:  Your physician has recommended you make the following change in your medication:  1.  INCREASE the Amiodarone 200 taking 2 tablets twice day 2.  INCREASE the Coreg to 6.25 taking 1 tablet twice a day   Labwork: TODAY:  BMET  Testing/Procedures: None ordered  Follow-Up: Your physician recommends that you schedule a follow-up appointment in: 08/19/16 WITH DR. [name] ARRIVING AT 1:45   Any Other Special Instructions Will Be Listed Below (If Applicable).   If you need a refill on your cardiac medications before your next appointment, please call your pharmacy.

## 2016-08-16 ENCOUNTER — Telehealth: Payer: Self-pay | Admitting: Cardiology

## 2016-08-16 LAB — BASIC METABOLIC PANEL
BUN: 30 mg/dL — ABNORMAL HIGH (ref 7–25)
CALCIUM: 9.6 mg/dL (ref 8.6–10.3)
CO2: 23 mmol/L (ref 20–31)
CREATININE: 1.82 mg/dL — AB (ref 0.70–1.18)
Chloride: 105 mmol/L (ref 98–110)
GLUCOSE: 74 mg/dL (ref 65–99)
Potassium: 4.2 mmol/L (ref 3.5–5.3)
SODIUM: 142 mmol/L (ref 135–146)

## 2016-08-16 NOTE — Telephone Encounter (Signed)
Mr. Schooley is returning a call . Please call .Marland Kitchen Thanks

## 2016-08-16 NOTE — Telephone Encounter (Signed)
Informed wife, Bethena Roys, of results. DPR on file. Wife verbalized understanding.

## 2016-08-16 NOTE — Addendum Note (Signed)
Addended by: Zebedee Iba on: 08/16/2016 01:38 PM   Modules accepted: Orders

## 2016-08-19 ENCOUNTER — Other Ambulatory Visit: Payer: Self-pay | Admitting: Cardiology

## 2016-08-19 ENCOUNTER — Encounter (INDEPENDENT_AMBULATORY_CARE_PROVIDER_SITE_OTHER): Payer: Self-pay

## 2016-08-19 ENCOUNTER — Ambulatory Visit (INDEPENDENT_AMBULATORY_CARE_PROVIDER_SITE_OTHER): Payer: Medicare Other | Admitting: Cardiology

## 2016-08-19 ENCOUNTER — Encounter: Payer: Self-pay | Admitting: *Deleted

## 2016-08-19 ENCOUNTER — Encounter: Payer: Self-pay | Admitting: Cardiology

## 2016-08-19 VITALS — BP 124/90 | HR 118 | Ht 68.0 in | Wt 223.2 lb

## 2016-08-19 DIAGNOSIS — I481 Persistent atrial fibrillation: Secondary | ICD-10-CM | POA: Diagnosis not present

## 2016-08-19 DIAGNOSIS — G4733 Obstructive sleep apnea (adult) (pediatric): Secondary | ICD-10-CM

## 2016-08-19 DIAGNOSIS — I4819 Other persistent atrial fibrillation: Secondary | ICD-10-CM

## 2016-08-19 DIAGNOSIS — R0683 Snoring: Secondary | ICD-10-CM | POA: Diagnosis not present

## 2016-08-19 DIAGNOSIS — Z01812 Encounter for preprocedural laboratory examination: Secondary | ICD-10-CM | POA: Diagnosis not present

## 2016-08-19 LAB — CBC WITH DIFFERENTIAL/PLATELET
BASOS PCT: 0 %
Basophils Absolute: 0 cells/uL (ref 0–200)
EOS ABS: 156 {cells}/uL (ref 15–500)
Eosinophils Relative: 2 %
HEMATOCRIT: 45.7 % (ref 38.5–50.0)
Hemoglobin: 14.8 g/dL (ref 13.2–17.1)
LYMPHS PCT: 23 %
Lymphs Abs: 1794 cells/uL (ref 850–3900)
MCH: 31 pg (ref 27.0–33.0)
MCHC: 32.4 g/dL (ref 32.0–36.0)
MCV: 95.8 fL (ref 80.0–100.0)
MONO ABS: 624 {cells}/uL (ref 200–950)
MPV: 10.5 fL (ref 7.5–12.5)
Monocytes Relative: 8 %
Neutro Abs: 5226 cells/uL (ref 1500–7800)
Neutrophils Relative %: 67 %
Platelets: 224 10*3/uL (ref 140–400)
RBC: 4.77 MIL/uL (ref 4.20–5.80)
RDW: 13.8 % (ref 11.0–15.0)
WBC: 7.8 10*3/uL (ref 3.8–10.8)

## 2016-08-19 LAB — BASIC METABOLIC PANEL
BUN: 25 mg/dL (ref 7–25)
CHLORIDE: 106 mmol/L (ref 98–110)
CO2: 25 mmol/L (ref 20–31)
Calcium: 9.6 mg/dL (ref 8.6–10.3)
Creat: 1.97 mg/dL — ABNORMAL HIGH (ref 0.70–1.18)
GLUCOSE: 100 mg/dL — AB (ref 65–99)
POTASSIUM: 4.1 mmol/L (ref 3.5–5.3)
Sodium: 142 mmol/L (ref 135–146)

## 2016-08-19 NOTE — Patient Instructions (Addendum)
Medication Instructions:  Your physician recommends that you continue on your current medications as directed. Please refer to the Current Medication list given to you today.  -- If you need a refill on your cardiac medications before your next appointment, please call your pharmacy --  Labwork: Pre procedure labs today: BMET & CBCD  Testing/Procedures: Your physician has recommended that you have a sleep study. This test records several body functions during sleep, including: brain activity, eye movement, oxygen and carbon dioxide blood levels, heart rate and rhythm, breathing rate and rhythm, the flow of air through your mouth and nose, snoring, body muscle movements, and chest and belly movement.  Your physician has recommended that you have a Cardioversion (DCCV). Electrical Cardioversion uses a jolt of electricity to your heart either through paddles or wired patches attached to your chest. This is a controlled, usually prescheduled, procedure. Defibrillation is done under light anesthesia in the hospital, and you usually go home the day of the procedure. This is done to get your heart back into a normal rhythm. You are not awake for the procedure. Please see the instruction sheet given to you today.  Your physician has recommended that you have an ablation. Catheter ablation is a medical procedure used to treat some cardiac arrhythmias (irregular heartbeats). During catheter ablation, a long, thin, flexible tube is put into a blood vessel in your groin (upper thigh), or neck. This tube is called an ablation catheter. It is then guided to your heart through the blood vessel. Radio frequency waves destroy small areas of heart tissue where abnormal heartbeats may cause an arrhythmia to start.   Aaliyah Gavel, RN will call you to arrange this procedure.  Follow-Up: To be determined once nurse call you to arrange ablation    Any Other Special Instructions Will Be Listed Below (If  Applicable).  Electrical Cardioversion Electrical cardioversion is the delivery of a jolt of electricity to change the rhythm of the heart. Sticky patches or metal paddles are placed on the chest to deliver the electricity from a device. This is done to restore a normal rhythm. A rhythm that is too fast or not regular keeps the heart from pumping well. Electrical cardioversion is done in an emergency if:   There is low or no blood pressure as a result of the heart rhythm.   Normal rhythm must be restored as fast as possible to protect the brain and heart from further damage.   It may save a life. Cardioversion may be done for heart rhythms that are not immediately life threatening, such as atrial fibrillation or flutter, in which:   The heart is beating too fast or is not regular.   Medicine to change the rhythm has not worked.   It is safe to wait in order to allow time for preparation.  Symptoms of the abnormal rhythm are bothersome.  The risk of stroke and other serious problems can be reduced. LET [facility] CARE PROVIDER KNOW ABOUT:   Any allergies you have.  All medicines you are taking, including vitamins, herbs, eye drops, creams, and over-the-counter medicines.  Previous problems you or members of your family have had with the use of anesthetics.   Any blood disorders you have.   Previous surgeries you have had.   Medical conditions you have. RISKS AND COMPLICATIONS  Generally, this is a safe procedure. However, problems can occur and include:   Breathing problems related to the anesthetic used.  A blood clot that breaks free and  travels to other parts of your body. This could cause a stroke or other problems. The risk of this is lowered by use of blood-thinning medicine (anticoagulant) prior to the procedure.  Cardiac arrest (rare). BEFORE THE PROCEDURE   You may have tests to detect blood clots in your heart and to evaluate heart function.  You may  start taking anticoagulants so your blood does not clot as easily.   Medicines may be given to help stabilize your heart rate and rhythm. PROCEDURE  You will be given medicine through an IV tube to reduce discomfort and make you sleepy (sedative).   An electrical shock will be delivered. AFTER THE PROCEDURE Your heart rhythm will be watched to make sure it does not change.    This information is not intended to replace advice given to you by your health care provider. Make sure you discuss any questions you have with your health care provider.   Document Released: 10/18/2002 Document Revised: 11/18/2014 Document Reviewed: 05/12/2013 Elsevier Interactive Patient Education 2016 Elsevier Inc.    Cardiac Ablation Cardiac ablation is a procedure to disable a small amount of heart tissue in very specific places. The heart has many electrical connections. Sometimes these connections are abnormal and can cause the heart to beat very fast or irregularly. By disabling some of the problem areas, heart rhythm can be improved or made normal. Ablation is done for people who:   Have Wolff-Parkinson-White syndrome.   Have other fast heart rhythms (tachycardia).   Have taken medicines for an abnormal heart rhythm (arrhythmia) that resulted in:   No success.   Side effects.   May have a high-risk heartbeat that could result in death.  LET [facility] CARE PROVIDER KNOW ABOUT:   Any allergies you have or any previous reactions you have had to X-ray dye, food (such as seafood), medicine, or tape.   All medicines you are taking, including vitamins, herbs, eye drops, creams, and over-the-counter medicines.   Previous problems you or members of your family have had with the use of anesthetics.   Any blood disorders you have.   Previous surgeries or procedures (such as a kidney transplant) you have had.   Medical conditions you have (such as kidney failure).  RISKS AND  COMPLICATIONS Generally, cardiac ablation is a safe procedure. However, problems can occur and include:   Increased risk of cancer. Depending on how long it takes to do the ablation, the dose of radiation can be high.  Bruising and bleeding where a thin, flexible tube (catheter) was inserted during the procedure.   Bleeding into the chest, especially into the sac that surrounds the heart (serious).  Need for a permanent pacemaker if the normal electrical system is damaged.   The procedure may not be fully effective, and this may not be recognized for months. Repeat ablation procedures are sometimes required. BEFORE THE PROCEDURE   Follow any instructions from your health care provider regarding eating and drinking before the procedure.   Take your medicines as directed at regular times with water, unless instructed otherwise by your health care provider. If you are taking diabetes medicine, including insulin, ask how you are to take it and if there are any special instructions you should follow. It is common to adjust insulin dosing the day of the ablation.  PROCEDURE  An ablation is usually performed in a catheterization laboratory with the guidance of fluoroscopy. Fluoroscopy is a type of X-ray that helps your health care provider see images  of your heart during the procedure.   An ablation is a minimally invasive procedure. This means a small cut (incision) is made in either your neck or groin. Your health care provider will decide where to make the incision based on your medical history and physical exam.  An IV tube will be started before the procedure begins. You will be given an anesthetic or medicine to help you relax (sedative).  The skin on your neck or groin will be numbed. A needle will be inserted into a large vein in your neck or groin and catheters will be threaded to your heart.  A special dye that shows up on fluoroscopy pictures may be injected through the catheter.  The dye helps your health care provider see the area of the heart that needs treatment.  The catheter has electrodes on the tip. When the area of heart tissue that is causing the arrhythmia is found, the catheter tip will send an electrical current to the area and "scar" the tissue. Three types of energy can be used to ablate the heart tissue:   Heat (radiofrequency energy).   Laser energy.   Extreme cold (cryoablation).   When the area of the heart has been ablated, the catheter will be taken out. Pressure will be held on the insertion site. This will help the insertion site clot and keep it from bleeding. A bandage will be placed on the insertion site.  AFTER THE PROCEDURE   After the procedure, you will be taken to a recovery area where your vital signs (blood pressure, heart rate, and breathing) will be monitored. The insertion site will also be monitored for bleeding.   You will need to lie still for 4-6 hours. This is to ensure you do not bleed from the catheter insertion site.    This information is not intended to replace advice given to you by your health care provider. Make sure you discuss any questions you have with your health care provider.   Document Released: 03/16/2009 Document Revised: 11/18/2014 Document Reviewed: 03/22/2013 Elsevier Interactive Patient Education [employer].

## 2016-08-19 NOTE — Progress Notes (Signed)
Electrophysiology Office Note   Date:  08/19/2016   ID:  Peter [name], DOB [birth year], MRN [id number]  PCP:  [name], MD  Cardiologist:  Marlou Porch Primary Electrophysiologist:  Constance Haw, MD    Chief Complaint  Patient presents with  . Advice Only    Afib  . Shortness of Breath     History of Present Illness: Peter [name] is a 70 y.o. male who presents today for electrophysiology evaluation.   History of CAD s/p PCI to LADx3 in 2006, previously chronic diastolic CHF, dyslipidemia, GERD, h/o GIB, HTN, hypertriglyceridemia, iron deficiency anemia, obesity, CKD stage III, recently diagnosed afib who was admitted with persistent afib (CHADSVASC 4).  Admitted to [facility] from 9/23-9/29/17. He was hospitalized early in 07/2016 for new onset atrial fibrillation/acute on chronic diastolic heart failure. He was started on Xarelto as well as Cardizem. He was seen in office 07/25/16 noted to haverapid rate but heart rate was in the 80's on vitals and Cardizem increased to 240 mg daily. The plan was to follow-up in 3 weeks and plan for DCCV if needed. His weight was 224 pounds at that visit. He presented to [facility] on 08/03/2016 with worsening SOB, orthopnea, and afib RVR. His BNP was elevated. CXR showed volume overload. He was admitted and treated with IV diuretics for heart failure and rate control. When he was able to lie flat, he underwent TEE on 08/05/16 showing EF 30-35% (down from 55% on 07/17/16) with mod TR, mild MR - cardioversion was attempted but despite application of 3 shocks he remained in atrial fib. Antiarrhythmic therapy was recommended and he was started on amiodarone. He underwent repeat attempt at conversion of atrial fib on 08/08/16 which was successful. Amiodarone IV was changed to oral amiodarone 400mg  daily.   Today, he denies symptoms of palpitations, chest pain, PND, lower extremity edema, claudication, dizziness, presyncope, syncope, bleeding, or neurologic sequela.  The patient is tolerating medications without difficulties. His main complaint is of significant fatigue and shortness of breath. He says that he is just not able to do his daily activities due to his symptoms. He says that he was feeling well last Wednesday, but by Thursday he started to have a return of the symptoms that was similar to when he was in atrial fibrillation.   Past Medical History:  Diagnosis Date  . At risk for sleep apnea    STOP-BANG= 5       SENT TO PCP 07-12-2015  . CAD (coronary artery disease)    a. s/p PCI with DES x 3 to LAD in 2006 with bifurcation lesion and Plavix indefinitely was recommended.  . Chronic combined systolic and diastolic CHF (congestive heart failure) ([place])    a. Prior EF normal but EF dropped to 30-35% in 07/2016 in setting of AFIB.  . CKD (chronic kidney disease), stage III   . Dyslipidemia   . Dyspnea on exertion   . GERD (gastroesophageal reflux disease)   . History of GI bleed   . History of kidney stones   . History of MI (myocardial infarction)    11-05-2005--  periprocedural MI  (cardiac cath) per dr [name] note  . Hypertension   . Hypertriglyceridemia   . Iron deficiency anemia   . Persistent atrial fibrillation ([place])    a. 07/2016, started on Eliquis -> readmitted later that month for AF RVR/acute HF - initially unsuccessful DCCV following TEE, so loaded with amio with repeat successful DCCV 07/2816.  . Right ureteral  stone   . S/P drug eluting coronary stent placement    x3  to LAD  2006   Past Surgical History:  Procedure Laterality Date  . CARDIOVASCULAR STRESS TEST  01-06-2013  dr [name]   normal perfusion study/  no ischemia or infarct/  normal LV function and wall motion , ef 57%  . CARDIOVERSION N/A 08/05/2016   Procedure: CARDIOVERSION;  Surgeon: Sanda Klein, MD;  Location: [place];  Service: Cardiovascular;  Laterality: N/A;  . CARDIOVERSION N/A 08/08/2016   Procedure: CARDIOVERSION;  Surgeon: Jerline Pain, MD;   Location: [facility] ENDOSCOPY;  Service: Cardiovascular;  Laterality: N/A;  . CORONARY ANGIOPLASTY  11-13-2005 dr [name]   Successful touch up post-dilatation of the 3 tandem overlying Cypher stents in proximal to mid LAD (based on IVUS 0% stenosis and patent diagonal branch)/  Cutting Balloon Angioplasty of Ramus branch  . CORONARY ANGIOPLASTY WITH STENT PLACEMENT  11-05-2005   dr [name]   PCI and DES x3 to birfurcation lesion LAD and diagonal branch of LAD/   20% dLM,  30-40% RCA, ef 60%  . CYSTOSCOPY WITH RETROGRADE PYELOGRAM, URETEROSCOPY AND STENT PLACEMENT Right 07/14/2015   Procedure: CYSTOSCOPY WITH RETROGRADE PYELOGRAM, URETEROSCOPY AND STENT PLACEMENT;  Surgeon: Alexis Frock, MD;  Location: [facility];  Service: Urology;  Laterality: Right;  . EXTRACORPOREAL SHOCK WAVE LITHOTRIPSY  yrs ago  . LEFT URETEROSCOPIC STONE EXTRACTION  04-10-2006  . STONE EXTRACTION WITH BASKET Right 07/14/2015   Procedure: STONE EXTRACTION WITH BASKET;  Surgeon: Alexis Frock, MD;  [facility];  Service: Urology;  Laterality: Right;  . TEE WITHOUT CARDIOVERSION N/A 08/05/2016   Procedure: TRANSESOPHAGEAL ECHOCARDIOGRAM (TEE);  Surgeon: Sanda Klein, MD;  Location[facility] ENDOSCOPY;  Service: Cardiovascular;  Laterality: N/A;  . TRANSTHORACIC ECHOCARDIOGRAM  02-04-2012   grade I diastolic dysfunction/  ef 55-60%/  trivial MR and TR/  mild LAE     Current Outpatient Prescriptions  Medication Sig Dispense Refill  . amiodarone (PACERONE) 200 MG tablet Take 2 tablets (400 mg total) by mouth 2 (two) times daily. 120 tablet 1  . atorvastatin (LIPITOR) 40 MG tablet Take 1 tablet (40 mg total) by mouth every morning. 90 tablet 3  . carvedilol (COREG) 6.25 MG tablet Take 1 tablet (6.25 mg total) by mouth 2 (two) times daily with a meal. 60 tablet 1  . clopidogrel (PLAVIX) 75 MG tablet Take 75 mg by mouth daily.    . diphenhydramine-acetaminophen (TYLENOL PM) 25-500 MG TABS tablet  Take 1 tablet by mouth at bedtime.    . fenofibrate (TRICOR) 145 MG tablet Take 145 mg by mouth daily.    . ferrous sulfate 325 (65 FE) MG tablet Take 325 mg by mouth every morning.     . furosemide (LASIX) 40 MG tablet Take 1 tablet (40 mg total) by mouth daily. 30 tablet 3  . lisinopril (PRINIVIL,ZESTRIL) 10 MG tablet Take 1 tablet (10 mg total) by mouth daily. 90 tablet 0  . nitroGLYCERIN (NITROSTAT) 0.4 MG SL tablet Place 1 tablet (0.4 mg total) under the tongue every 5 (five) minutes as needed for chest pain. 25 tablet prn  . pantoprazole (PROTONIX) 40 MG tablet Take 40 mg by mouth daily.     . potassium chloride SA (K-DUR,KLOR-CON) 20 MEQ tablet Take 1 tablet (20 mEq total) by mouth daily. 30 tablet 3  . rivaroxaban (XARELTO) 20 MG TABS tablet Take 1 tablet (20 mg total) by mouth daily with supper. 30 tablet  11   No current facility-administered medications for this visit.     Allergies:   Review of patient's allergies indicates no known allergies.   Social History:  The patient  reports that he has never smoked. He has never used smokeless tobacco. He reports that he does not drink alcohol or use drugs.   Family History:  The patient's family history includes Heart attack (age of onset: 26) in his father.    ROS:  Please see the history of present illness.   Otherwise, review of systems is positive for none.   All other systems are reviewed and negative.    PHYSICAL EXAM: VS:  BP 124/90   Pulse (!) 118   Ht 5\' 8"  (1.727 m)   Wt 223 lb 3.2 oz (101.2 kg)   BMI 33.94 kg/m  , BMI Body mass index is 33.94 kg/m. GEN: Well nourished, well developed, in no acute distress  HEENT: normal  Neck: no JVD, carotid bruits, or masses Cardiac: tachycardic, regular; no murmurs, rubs, or gallops,no edema  Respiratory:  clear to auscultation bilaterally, normal work of breathing GI: soft, nontender, nondistended, + BS MS: no deformity or atrophy  Skin: warm and dry Neuro:  Strength and  sensation are intact Psych: euthymic mood, full affect  EKG:  EKG is ordered today. Personal review of the ekg ordered shows atrial flutter, , LAFB, rate 118, lateral Q waves  Recent Labs: 07/17/2016: ALT 22 08/03/2016: B Natriuretic Peptide 414.8; TSH 0.871 08/04/2016: Hemoglobin 13.9; Platelets 178 08/05/2016: Magnesium 1.9 08/15/2016: BUN 30; Creat 1.82; Potassium 4.2; Sodium 142    Lipid Panel     Component Value Date/Time   CHOL 140 01/01/2013 1037   TRIG 230.0 (H) 01/01/2013 1037   HDL 38.20 (L) 01/01/2013 1037   CHOLHDL 4 01/01/2013 1037   VLDL 46.0 (H) 01/01/2013 1037   LDLDIRECT 68.6 01/01/2013 1037     Wt Readings from Last 3 Encounters:  08/19/16 223 lb 3.2 oz (101.2 kg)  08/15/16 222 lb (100.7 kg)  08/09/16 218 lb 9.6 oz (99.2 kg)      Other studies Reviewed: Additional studies/ records that were reviewed today include: TEE 08/05/16  Review of the above records today demonstrates:  - Left ventricle: The cavity size was normal. Systolic function was   moderately to severely reduced. The estimated ejection fraction   was in the range of 30% to 35%. Moderate diffuse hypokinesis with   no identifiable regional variations. No evidence of thrombus. - Aortic valve: There was trivial regurgitation. - Mitral valve: There was mild regurgitation directed centrally. - Left atrium: The atrium was severely dilated. No evidence of   thrombus in the atrial cavity or appendage. No evidence of   thrombus in the atrial cavity or appendage. No evidence of   thrombus in the atrial cavity or appendage. There was   moderatecontinuous spontaneous echo contrast (&quot;smoke&quot;) in the   cavity and the appendage. - Atrial septum: No defect or patent foramen ovale was identified. - Tricuspid valve: There was moderate regurgitation directed   centrally.   ASSESSMENT AND PLAN:  1.  Persistent atrial fibrillation/flutter: Currently on amiodarone, with 3 cardioversions in the past. He  presents today in clinic in atrial flutter that appears typical in nature. I discussed with him the possibilities of adjusting medications versus scheduling for A. fib ablation. Both him and his wife feel like A. fib ablation are the most improvement. Both him and his wife feel like A. fib ablation is  the most appropriate course of action at this time. Wrist and benefits were discussed. Risks include bleeding, tamponade, heart block, stroke, and damage to surrounding organs. They both understand the risks and has agreed to the procedure. Due to the fact that we Kasara Schomer have to schedule in a few months, plan for cardioversion to see if we can get him to stay in sinus rhythm.   This patients CHA2DS2-VASc Score and unadjusted Ischemic Stroke Rate (% per year) is equal to 4.8 % stroke rate/year from a score of 4  Above score calculated as 1 point each if present [CHF, HTN, DM, Vascular=MI/PAD/Aortic Plaque, Age if 65-74, or Male] Above score calculated as 2 points each if present [Age > 75, or Stroke/TIA/TE]  2. Likely nonischemic cardiomyopathy: EF is 30-35% on his TEE from the end of September. It was normal on 9/6. Currently he is on Coreg and lisinopril and is taking Lasix. He does occasionally get short of breath. I told him to weigh himself daily and take an extra dose of Lasix should he need it. We Lanyia Jewel check a basic metabolic today.  3. HTN: Well controlled today  4. CAD s/p PCI to LAD in 2006: Currently asymptomatic    Current medicines are reviewed at length with the patient today.   The patient does not have concerns regarding his medicines.  The following changes were made today:  none  Labs/ tests ordered today include:  No orders of the defined types were placed in this encounter.    Disposition:   FU with Laney Louderback 3 months  Signed, Dareion Kneece Meredith Leeds, MD  08/19/2016 2:59 PM     [place] [place] [address] [phone]  (office) [phone] (fax)

## 2016-08-19 NOTE — Addendum Note (Signed)
Addended by: Stanton Kidney on: 08/19/2016 04:20 PM   Modules accepted: Orders

## 2016-08-23 ENCOUNTER — Ambulatory Visit: Payer: Medicare Other | Admitting: Cardiology

## 2016-08-27 ENCOUNTER — Encounter (HOSPITAL_COMMUNITY)
Admission: RE | Disposition: A | Discharge status: Home / Self Care | Payer: Self-pay | Source: Ambulatory Visit | Attending: Cardiology

## 2016-08-27 ENCOUNTER — Ambulatory Visit (HOSPITAL_COMMUNITY)
Admission: RE | Admit: 2016-08-27 | Discharge: 2016-08-27 | Disposition: A | Discharge status: Home / Self Care | Payer: Medicare Other | Source: Ambulatory Visit | Attending: Cardiology | Admitting: Cardiology

## 2016-08-27 ENCOUNTER — Encounter (HOSPITAL_COMMUNITY): Payer: Self-pay | Admitting: *Deleted

## 2016-08-27 DIAGNOSIS — I252 Old myocardial infarction: Secondary | ICD-10-CM | POA: Diagnosis not present

## 2016-08-27 DIAGNOSIS — I130 Hypertensive heart and chronic kidney disease with heart failure and stage 1 through stage 4 chronic kidney disease, or unspecified chronic kidney disease: Secondary | ICD-10-CM | POA: Insufficient documentation

## 2016-08-27 DIAGNOSIS — E781 Pure hyperglyceridemia: Secondary | ICD-10-CM | POA: Diagnosis not present

## 2016-08-27 DIAGNOSIS — Z955 Presence of coronary angioplasty implant and graft: Secondary | ICD-10-CM | POA: Diagnosis not present

## 2016-08-27 DIAGNOSIS — I481 Persistent atrial fibrillation: Secondary | ICD-10-CM | POA: Insufficient documentation

## 2016-08-27 DIAGNOSIS — Z7901 Long term (current) use of anticoagulants: Secondary | ICD-10-CM | POA: Insufficient documentation

## 2016-08-27 DIAGNOSIS — E785 Hyperlipidemia, unspecified: Secondary | ICD-10-CM | POA: Diagnosis not present

## 2016-08-27 DIAGNOSIS — I5022 Chronic systolic (congestive) heart failure: Secondary | ICD-10-CM | POA: Insufficient documentation

## 2016-08-27 DIAGNOSIS — I4891 Unspecified atrial fibrillation: Secondary | ICD-10-CM | POA: Diagnosis present

## 2016-08-27 DIAGNOSIS — I2510 Atherosclerotic heart disease of native coronary artery without angina pectoris: Secondary | ICD-10-CM | POA: Diagnosis not present

## 2016-08-27 DIAGNOSIS — D509 Iron deficiency anemia, unspecified: Secondary | ICD-10-CM | POA: Insufficient documentation

## 2016-08-27 DIAGNOSIS — K219 Gastro-esophageal reflux disease without esophagitis: Secondary | ICD-10-CM | POA: Diagnosis not present

## 2016-08-27 DIAGNOSIS — N183 Chronic kidney disease, stage 3 (moderate): Secondary | ICD-10-CM | POA: Insufficient documentation

## 2016-08-27 DIAGNOSIS — Z79899 Other long term (current) drug therapy: Secondary | ICD-10-CM | POA: Insufficient documentation

## 2016-08-27 DIAGNOSIS — Z7902 Long term (current) use of antithrombotics/antiplatelets: Secondary | ICD-10-CM | POA: Insufficient documentation

## 2016-08-27 SURGERY — CANCELLED PROCEDURE

## 2016-08-27 MED ORDER — SODIUM CHLORIDE 0.9 % IV SOLN: INTRAVENOUS | Status: DC

## 2016-08-27 NOTE — H&P (Signed)
Peter [name] has a history of atrial fibrillation/flutter.  Presented today for cardioversion.  In sinus rhythm today and thus no cardioversion performed.  Will [name], MD 08/27/2016 1:08 PM

## 2016-09-04 ENCOUNTER — Emergency Department (HOSPITAL_COMMUNITY)
Admission: EM | Admit: 2016-09-04 | Discharge: 2016-09-04 | Disposition: A | Discharge status: Home / Self Care | Payer: Medicare Other | Attending: Emergency Medicine | Admitting: Emergency Medicine

## 2016-09-04 ENCOUNTER — Encounter (HOSPITAL_COMMUNITY): Payer: Self-pay

## 2016-09-04 DIAGNOSIS — I10 Essential (primary) hypertension: Secondary | ICD-10-CM | POA: Diagnosis not present

## 2016-09-04 DIAGNOSIS — Z79899 Other long term (current) drug therapy: Secondary | ICD-10-CM | POA: Diagnosis not present

## 2016-09-04 DIAGNOSIS — R040 Epistaxis: Secondary | ICD-10-CM | POA: Diagnosis not present

## 2016-09-04 DIAGNOSIS — I130 Hypertensive heart and chronic kidney disease with heart failure and stage 1 through stage 4 chronic kidney disease, or unspecified chronic kidney disease: Secondary | ICD-10-CM | POA: Diagnosis not present

## 2016-09-04 DIAGNOSIS — Z9861 Coronary angioplasty status: Secondary | ICD-10-CM | POA: Diagnosis not present

## 2016-09-04 DIAGNOSIS — N183 Chronic kidney disease, stage 3 (moderate): Secondary | ICD-10-CM | POA: Insufficient documentation

## 2016-09-04 DIAGNOSIS — I2510 Atherosclerotic heart disease of native coronary artery without angina pectoris: Secondary | ICD-10-CM | POA: Insufficient documentation

## 2016-09-04 DIAGNOSIS — Z7901 Long term (current) use of anticoagulants: Secondary | ICD-10-CM

## 2016-09-04 DIAGNOSIS — I5042 Chronic combined systolic (congestive) and diastolic (congestive) heart failure: Secondary | ICD-10-CM | POA: Insufficient documentation

## 2016-09-04 DIAGNOSIS — I480 Paroxysmal atrial fibrillation: Secondary | ICD-10-CM | POA: Diagnosis not present

## 2016-09-04 MED ORDER — ACETAMINOPHEN 325 MG PO TABS
650.0000 mg | ORAL_TABLET | Freq: Once | ORAL | Status: AC
  Administered 2016-09-04: 650 mg via ORAL
  Filled 2016-09-04: qty 2

## 2016-09-04 MED ORDER — OXYMETAZOLINE HCL 0.05 % NA SOLN
1.0000 | Freq: Once | NASAL | Status: AC
  Administered 2016-09-04: 1 via NASAL
  Filled 2016-09-04: qty 15

## 2016-09-04 MED ORDER — LISINOPRIL 10 MG PO TABS
10.0000 mg | ORAL_TABLET | Freq: Once | ORAL | Status: DC
  Filled 2016-09-04: qty 1

## 2016-09-04 NOTE — ED Notes (Signed)
MD notified that patient's nose is bleeding again and that he has a headache.

## 2016-09-04 NOTE — Progress Notes (Addendum)
Peter [name] is well known to me. Seen as a courtesy in the [place] for epistaxis - started last night, improved now with a nasal tampon in place. He has also had rectal bleeding from hemorrhoids. Has been on long-term plavix since PCI in 2006 - now on Xarelto. Advised him to discontinue plavix until he is seen in follow-up on Friday in the office. He reports BP has been high since decreasing his lisiniopril from 40->10 mg. He may need to go back up on the dose.  Time Spent with patient and discussion with ER attending: 20 minutes  Pixie Casino, MD, [facility] Attending Cardiologist [place]

## 2016-09-04 NOTE — ED Notes (Signed)
MD at bedside teaching. Family at bedside.

## 2016-09-04 NOTE — Discharge Instructions (Signed)
Please remove the packing in 24 hours as we discussed. Return here if the bleeding becomes worse. Follow-up with your doctor as we talked about

## 2016-09-04 NOTE — ED Triage Notes (Addendum)
Pt here with nose bleed.  Taking 2 blood thinners.  Started this morning.  Also occurred yesterday but stopped on his own.  Bleeding controlled at this time.  HTN in triage.

## 2016-09-04 NOTE — ED Notes (Signed)
Bed: WA02 Expected date:  Expected time:  Means of arrival:  Comments: 

## 2016-09-04 NOTE — ED Provider Notes (Signed)
[name] Provider Note   CSN: [id number] Arrival date & time: 09/04/16  [id number]     History   Chief Complaint Chief Complaint  Patient presents with  . Epistaxis    HPI Peter [name] is a 70 y.o. male.  69 year old male presents with epistaxis 2 days. Symptoms have been from his right nares and he does take Xarelto as well as Plavix. Patient denies any weakness. Denies any trauma to his nose. Has used direct pressure which did alleviate his symptoms yesterday but then they returned again today. No prior history of nosebleeds. Nothing makes his symptoms worse.      Past Medical History:  Diagnosis Date  . At risk for sleep apnea    STOP-BANG= 5       SENT TO PCP 07-12-2015  . CAD (coronary artery disease)    a. s/p PCI with DES x 3 to LAD in 2006 with bifurcation lesion and Plavix indefinitely was recommended.  . Chronic combined systolic and diastolic CHF (congestive heart failure) ([place])    a. Prior EF normal but EF dropped to 30-35% in 07/2016 in setting of AFIB.  . CKD (chronic kidney disease), stage III   . Dyslipidemia   . Dyspnea on exertion   . GERD (gastroesophageal reflux disease)   . History of GI bleed   . History of kidney stones   . History of MI (myocardial infarction)    11-05-2005--  periprocedural MI  (cardiac cath) per dr [name] note  . Hypertension   . Hypertriglyceridemia   . Iron deficiency anemia   . Persistent atrial fibrillation ([place])    a. 07/2016, started on Eliquis -> readmitted later that month for AF RVR/acute HF - initially unsuccessful DCCV following TEE, so loaded with amio with repeat successful DCCV 07/2816.  . Right ureteral stone   . S/P drug eluting coronary stent placement    x3  to LAD  2006    Patient Active Problem List   Diagnosis Date Noted  . Obesity 08/09/2016  . Atrial fibrillation with RVR ([place]) 08/03/2016  . New onset a-fib ([place]) 07/16/2016  . Persistent atrial fibrillation ([place]) 07/16/2016  . Acute  systolic (congestive) heart failure   . Ejection fraction   . Iron deficiency anemia due to chronic blood loss 02/04/2012  . Anemia 02/03/2012  . CKD (chronic kidney disease), stage III 02/03/2012  . DOE (dyspnea on exertion) 01/28/2012  . Hypertension   . Coronary artery disease due to lipid rich plaque   . Dyslipidemia   . GERD (gastroesophageal reflux disease)     Past Surgical History:  Procedure Laterality Date  . CARDIOVASCULAR STRESS TEST  01-06-2013  dr [name]   normal perfusion study/  no ischemia or infarct/  normal LV function and wall motion , ef 57%  . CARDIOVERSION N/A 08/05/2016   Procedure: CARDIOVERSION;  Surgeon: Sanda Klein, MD;  Location: [facility];  Service: Cardiovascular;  Laterality: N/A;  . CARDIOVERSION N/A 08/08/2016   Procedure: CARDIOVERSION;  Surgeon: Jerline Pain, MD;  Location[facility] ENDOSCOPY;  Service: Cardiovascular;  Laterality: N/A;  . CORONARY ANGIOPLASTY  11-13-2005 dr [name]   Successful touch up post-dilatation of the 3 tandem overlying Cypher stents in proximal to mid LAD (based on IVUS 0% stenosis and patent diagonal branch)/  Cutting Balloon Angioplasty of Ramus branch  . CORONARY ANGIOPLASTY WITH STENT PLACEMENT  11-05-2005   dr [name]   PCI and DES x3 to birfurcation lesion LAD and diagonal branch of  LAD/   20% dLM,  30-40% RCA, ef 60%  . CYSTOSCOPY WITH RETROGRADE PYELOGRAM, URETEROSCOPY AND STENT PLACEMENT Right 07/14/2015   Procedure: CYSTOSCOPY WITH RETROGRADE PYELOGRAM, URETEROSCOPY AND STENT PLACEMENT;  Surgeon: Alexis Frock, MD;  Location: [facility];  Service: Urology;  Laterality: Right;  . EXTRACORPOREAL SHOCK WAVE LITHOTRIPSY  yrs ago  . LEFT URETEROSCOPIC STONE EXTRACTION  04-10-2006  . STONE EXTRACTION WITH BASKET Right 07/14/2015   Procedure: STONE EXTRACTION WITH BASKET;  Surgeon: Alexis Frock, MD;  Location[facility];  Service: Urology;  Laterality: Right;  . TEE WITHOUT CARDIOVERSION  N/A 08/05/2016   Procedure: TRANSESOPHAGEAL ECHOCARDIOGRAM (TEE);  Surgeon: Sanda Klein, MD;  Location: [facility] ENDOSCOPY;  Service: Cardiovascular;  Laterality: N/A;  . TRANSTHORACIC ECHOCARDIOGRAM  02-04-2012   grade I diastolic dysfunction/  ef 55-60%/  trivial MR and TR/  mild LAE       Home Medications    Prior to Admission medications   Medication Sig Start Date End Date Taking? Authorizing Provider  amiodarone (PACERONE) 200 MG tablet Take 2 tablets (400 mg total) by mouth 2 (two) times daily. 08/15/16   Eileen Stanford, PA-C  atorvastatin (LIPITOR) 40 MG tablet Take 1 tablet (40 mg total) by mouth every morning. 02/16/16   Jerline Pain, MD  carvedilol (COREG) 6.25 MG tablet Take 1 tablet (6.25 mg total) by mouth 2 (two) times daily with a meal. 08/15/16   Eileen Stanford, PA-C  clopidogrel (PLAVIX) 75 MG tablet Take 75 mg by mouth daily.    Historical Provider, MD  diphenhydramine-acetaminophen (TYLENOL PM) 25-500 MG TABS tablet Take 1 tablet by mouth at bedtime.    Historical Provider, MD  fenofibrate (TRICOR) 145 MG tablet Take 145 mg by mouth daily.    Historical Provider, MD  ferrous sulfate 325 (65 FE) MG tablet Take 325 mg by mouth every morning.     Historical Provider, MD  furosemide (LASIX) 40 MG tablet Take 1 tablet (40 mg total) by mouth daily. 08/09/16   Dayna N Dunn, PA-C  lisinopril (PRINIVIL,ZESTRIL) 10 MG tablet Take 1 tablet (10 mg total) by mouth daily. 07/29/16 10/27/16  Isaiah Serge, NP  nitroGLYCERIN (NITROSTAT) 0.4 MG SL tablet Place 1 tablet (0.4 mg total) under the tongue every 5 (five) minutes as needed for chest pain. 02/16/16   Jerline Pain, MD  pantoprazole (PROTONIX) 40 MG tablet Take 40 mg by mouth daily.     Historical Provider, MD  potassium chloride SA (K-DUR,KLOR-CON) 20 MEQ tablet Take 1 tablet (20 mEq total) by mouth daily. 08/10/16   Dayna N Dunn, PA-C  rivaroxaban (XARELTO) 20 MG TABS tablet Take 1 tablet (20 mg total) by mouth daily with supper.  08/09/16   Dayna N Dunn, PA-C    Family History Family History  Problem Relation Age of Onset  . Heart attack Father 54    Social History Social History  Substance Use Topics  . Smoking status: Never Smoker  . Smokeless tobacco: Never Used  . Alcohol use No     Allergies   Review of patient's allergies indicates no known allergies.   Review of Systems Review of Systems  All other systems reviewed and are negative.    Physical Exam Updated Vital Signs BP (!) 166/117 (BP Location: Right Arm)   Pulse 86   Temp 98.3 F (36.8 C) (Oral)   Resp 20   SpO2 100%   Physical Exam  Constitutional: He is oriented to  person, place, and time. He appears well-developed and well-nourished.  Non-toxic appearance. No distress.  HENT:  Head: Normocephalic and atraumatic.  Nose: Epistaxis is observed.  Eyes: Conjunctivae, EOM and lids are normal. Pupils are equal, round, and reactive to light.  Neck: Normal range of motion. Neck supple. No tracheal deviation present. No thyroid mass present.  Cardiovascular: Normal rate, regular rhythm and normal heart sounds.  Exam reveals no gallop.   No murmur heard. Pulmonary/Chest: Effort normal and breath sounds normal. No stridor. No respiratory distress. He has no decreased breath sounds. He has no wheezes. He has no rhonchi. He has no rales.  Abdominal: Soft. Normal appearance and bowel sounds are normal. He exhibits no distension. There is no tenderness. There is no rebound and no CVA tenderness.  Musculoskeletal: Normal range of motion. He exhibits no edema or tenderness.  Neurological: He is alert and oriented to person, place, and time. He has normal strength. No cranial nerve deficit or sensory deficit. GCS eye subscore is 4. GCS verbal subscore is 5. GCS motor subscore is 6.  Skin: Skin is warm and dry. No abrasion and no rash noted.  Psychiatric: He has a normal mood and affect. His speech is normal and behavior is normal.  Nursing note  and vitals reviewed.    ED Treatments / Results  Labs (all labs ordered are listed, but only abnormal results are displayed) Labs Reviewed - No data to display  EKG  EKG Interpretation None       Radiology No results found.  Procedures .Epistaxis Management Date/Time: 09/04/2016 10:30 AM Performed by: Lacretia Leigh Authorized by: Lacretia Leigh   Consent:    Consent obtained:  Verbal   Consent given by:  Patient   Risks discussed:  Bleeding   Alternatives discussed:  No treatment Anesthesia (see MAR for exact dosages):    Anesthesia method:  None Procedure details:    Treatment site:  R anterior   Treatment method:  Nasal tampon   Treatment complexity:  Limited   Treatment episode: initial   Post-procedure details:    Assessment:  Bleeding decreased   Patient tolerance of procedure:  Tolerated well, no immediate complications   (including critical care time)  Medications Ordered in ED Medications  oxymetazoline (AFRIN) 0.05 % nasal spray 1 spray (1 spray Each Nare Given by Other 09/04/16 1048)     Initial Impression / Assessment and Plan / ED Course  I have reviewed the triage vital signs and the nursing notes.  Pertinent labs & imaging results that were available during my care of the patient were reviewed by me and considered in my medical decision making (see chart for details).  Clinical Course    Patient rechecked and no further bleeding noted. Blood pressure elevated and he will take his normal morning dose when he gets home. Return precautions given. Will remove packing in 24 hours  Final Clinical Impressions(s) / ED Diagnoses   Final diagnoses:  None    New Prescriptions New Prescriptions   No medications on file     Lacretia Leigh, MD 09/04/16 1154

## 2016-09-05 ENCOUNTER — Encounter: Payer: Self-pay | Admitting: Physician Assistant

## 2016-09-05 NOTE — Progress Notes (Addendum)
Cardiology Office Note    Date:  09/06/2016   ID:  Peter [name], DOB 09-Dec-1945, MRN ID:[id number]  PCP:  [name], MD  Cardiologist:  [name] /Dr. [name]  CC: post hospital follow up   History of Present Illness:  Peter [name] is a 70 y.o. male with a history of CAD s/p PCI to LADx3 in [id number], chronic diastolic CHF, HLD with hyperTG, GERD, h/o GIB, HTN, iron deficiency anemia, obesity, CKD stage III, and PAF on amio and Xarelto who presents for post hosptail follow up.    He was admitted to [facility] from 9/23-9/29/17. He was hospitalized early in 07/2016 for new onset atrial fibrillation/acute on chronic diastolic heart failure. He was started on Xarelto as well as Cardizem. He was seen in office 07/25/16 noted to haverapid rate but heart rate was in the 80's on vitals and Cardizem increased to 240 mg daily. The plan was to follow-up in 3 weeks and plan for DCCV if needed. His weight was 224 pounds at that visit. He presented to [facility] on 08/03/2016 with worsening SOB, orthopnea, and afib RVR. His BNP was elevated. CXR showed volume overload. He was admitted and treated with IV diuretics for heart failure and rate control. When he was able to lie flat, he underwent TEE on 08/05/16 showing EF 30-35% (down from 55% on 07/17/16) with mod TR, mild MR - cardioversion was attempted but despite application of 3 shocks he remained in atrial fib. Antiarrhythmic therapy was recommended and he was started on amiodarone (AAD options limited given his CAD/LVD). He underwent repeat attempt at conversion of atrial fib on 08/08/16 which was successful. Amiodarone IV was changed to oral amiodarone 400mg  daily. Diltiazem was discontinued given his LV dysfunction and he was placed on coreg 3.125mg  BID. Discharged on Lasix 40mg  daily (increased from 20).  I saw him in clinic on 08/15/16 for post hospital follow up. ECG showed afib and he felt weak. I increased amio to 400mg  BID and increased coreg from 3.125mg  BID--->  6.25mg  BID. I set him up to see Dr. [name] with EP the following week. He planned for DCCV followed by afib ablation. DCCV was canceled because he had spontaneously converted to NSR. He was seen in the [facility] ER on 09/04/16 for epistaxis and HTN. Dr. Debara Pickett stopped his plavix. His BP was noted to be high.   Today he presents to clinic for follow up.  He has been feeling better since being back in NSR. He is able to walking longer distances without giving out, but he still feels more tired than he used to. He thinks he is getting better each day. No real significant SOB, chest pain or jaw pain. No LE edema, orthopnea or PND. No dizziness or syncope. He is scheduled to get a sleep study on 09/19/16. No more nose bleeds. No blood in stool or urine. His bP had been running high at home and he self increased his lisinopril to 20mg  daily and BP improved.   Past Medical History:  Diagnosis Date  . At risk for sleep apnea    STOP-BANG= 5       SENT TO PCP 07-12-2015  . CAD (coronary artery disease)    a. s/p PCI with DES x 3 to LAD in 2006 with bifurcation lesion and Plavix indefinitely was recommended.  . Chronic combined systolic and diastolic CHF (congestive heart failure) ([place])    a. Prior EF normal but EF dropped to 30-35% in 07/2016 in setting  of AFIB.  . CKD (chronic kidney disease), stage III   . Dyslipidemia   . Dyspnea on exertion   . GERD (gastroesophageal reflux disease)   . History of GI bleed   . History of kidney stones   . History of MI (myocardial infarction)    11-05-2005--  periprocedural MI  (cardiac cath) per dr [name] note  . Hypertension   . Hypertriglyceridemia   . Iron deficiency anemia   . Persistent atrial fibrillation ([place])    a. 07/2016, started on Eliquis -> readmitted later that month for AF RVR/acute HF - initially unsuccessful DCCV following TEE, so loaded with amio with repeat successful DCCV 07/2816.  . Right ureteral stone   . S/P drug eluting coronary stent placement     x3  to LAD  2006    Past Surgical History:  Procedure Laterality Date  . CARDIOVASCULAR STRESS TEST  01-06-2013  dr [name]   normal perfusion study/  no ischemia or infarct/  normal LV function and wall motion , ef 57%  . CARDIOVERSION N/A 08/05/2016   Procedure: CARDIOVERSION;  Surgeon: Sanda Klein, MD;  Location: [place];  Service: Cardiovascular;  Laterality: N/A;  . CARDIOVERSION N/A 08/08/2016   Procedure: CARDIOVERSION;  Surgeon: Jerline Pain, MD;  Location: [facility] ENDOSCOPY;  Service: Cardiovascular;  Laterality: N/A;  . CORONARY ANGIOPLASTY  11-13-2005 dr [name]   Successful touch up post-dilatation of the 3 tandem overlying Cypher stents in proximal to mid LAD (based on IVUS 0% stenosis and patent diagonal branch)/  Cutting Balloon Angioplasty of Ramus branch  . CORONARY ANGIOPLASTY WITH STENT PLACEMENT  11-05-2005   dr [name]   PCI and DES x3 to birfurcation lesion LAD and diagonal branch of LAD/   20% dLM,  30-40% RCA, ef 60%  . CYSTOSCOPY WITH RETROGRADE PYELOGRAM, URETEROSCOPY AND STENT PLACEMENT Right 07/14/2015   Procedure: CYSTOSCOPY WITH RETROGRADE PYELOGRAM, URETEROSCOPY AND STENT PLACEMENT;  Surgeon: Alexis Frock, MD;  Location: [facility];  Service: Urology;  Laterality: Right;  . EXTRACORPOREAL SHOCK WAVE LITHOTRIPSY  yrs ago  . LEFT URETEROSCOPIC STONE EXTRACTION  04-10-2006  . STONE EXTRACTION WITH BASKET Right 07/14/2015   Procedure: STONE EXTRACTION WITH BASKET;  Surgeon: Alexis Frock, MD;  Location[facility];  Service: Urology;  Laterality: Right;  . TEE WITHOUT CARDIOVERSION N/A 08/05/2016   Procedure: TRANSESOPHAGEAL ECHOCARDIOGRAM (TEE);  Surgeon: Sanda Klein, MD;  Location: [facility] ENDOSCOPY;  Service: Cardiovascular;  Laterality: N/A;  . TRANSTHORACIC ECHOCARDIOGRAM  02-04-2012   grade I diastolic dysfunction/  ef 55-60%/  trivial MR and TR/  mild LAE    Current Medications: Outpatient Medications Prior to Visit    Medication Sig Dispense Refill  . atorvastatin (LIPITOR) 40 MG tablet Take 1 tablet (40 mg total) by mouth every morning. 90 tablet 3  . carvedilol (COREG) 6.25 MG tablet Take 1 tablet (6.25 mg total) by mouth 2 (two) times daily with a meal. 60 tablet 1  . diphenhydramine-acetaminophen (TYLENOL PM) 25-500 MG TABS tablet Take 1 tablet by mouth at bedtime.    . fenofibrate (TRICOR) 145 MG tablet Take 145 mg by mouth daily.    . ferrous sulfate 325 (65 FE) MG tablet Take 325 mg by mouth every morning.     . furosemide (LASIX) 40 MG tablet Take 1 tablet (40 mg total) by mouth daily. 30 tablet 3  . nitroGLYCERIN (NITROSTAT) 0.4 MG SL tablet Place 1 tablet (0.4 mg total) under the tongue every 5 (  five) minutes as needed for chest pain. 25 tablet prn  . pantoprazole (PROTONIX) 40 MG tablet Take 40 mg by mouth daily.     . potassium chloride SA (K-DUR,KLOR-CON) 20 MEQ tablet Take 1 tablet (20 mEq total) by mouth daily. 30 tablet 3  . rivaroxaban (XARELTO) 20 MG TABS tablet Take 1 tablet (20 mg total) by mouth daily with supper. 30 tablet 11  . amiodarone (PACERONE) 200 MG tablet Take 2 tablets (400 mg total) by mouth 2 (two) times daily. 120 tablet 1  . clopidogrel (PLAVIX) 75 MG tablet Take 75 mg by mouth daily.    Marland Kitchen lisinopril (PRINIVIL,ZESTRIL) 10 MG tablet Take 1 tablet (10 mg total) by mouth daily. (Patient not taking: Reported on 09/06/2016) 90 tablet 0   No facility-administered medications prior to visit.      Allergies:   Review of patient's allergies indicates no known allergies.   Social History   Social History  . Marital status: Married    Spouse name: N/A  . Number of children: N/A  . Years of education: N/A   Social History Main Topics  . Smoking status: Never Smoker  . Smokeless tobacco: Never Used  . Alcohol use No  . Drug use: No  . Sexual activity: Not Asked   Other Topics Concern  . None   Social History Narrative  . None     Family History:  The patient's  family history includes Heart attack (age of onset: 79) in his father.     ROS:   Please see the history of present illness.    ROS All other systems reviewed and are negative.   PHYSICAL EXAM:   VS:  BP 122/74   Pulse 64   Ht 5\' 8"  (1.727 m)   Wt 216 lb 12.8 oz (98.3 kg)   SpO2 97%   BMI 32.96 kg/m    GEN: Well nourished, well developed, in no acute distress  HEENT: normal  Neck: no JVD, carotid bruits, or masses Cardiac: irreg irreg; no murmurs, rubs, or gallops,no edema  Respiratory:  clear to auscultation bilaterally, normal work of breathing GI: soft, nontender, nondistended, + BS MS: no deformity or atrophy  Skin: warm and dry, no rash Neuro:  Alert and Oriented x 3, Strength and sensation are intact Psych: euthymic mood, full affect  Wt Readings from Last 3 Encounters:  09/06/16 216 lb 12.8 oz (98.3 kg)  08/19/16 223 lb 3.2 oz (101.2 kg)  08/15/16 222 lb (100.7 kg)      Studies/Labs Reviewed:   EKG:  EKG is NOT ordered today.   Recent Labs: 07/17/2016: ALT 22 08/03/2016: B Natriuretic Peptide 414.8; TSH 0.871 08/05/2016: Magnesium 1.9 08/19/2016: BUN 25; Creat 1.97; Hemoglobin 14.8; Platelets 224; Potassium 4.1; Sodium 142   Lipid Panel    Component Value Date/Time   CHOL 140 01/01/2013 1037   TRIG 230.0 (H) 01/01/2013 1037   HDL 38.20 (L) 01/01/2013 1037   CHOLHDL 4 01/01/2013 1037   VLDL 46.0 (H) 01/01/2013 1037   LDLDIRECT 68.6 01/01/2013 1037    Additional studies/ records that were reviewed today include:  2D ECHO: 07/17/2016 LV EF: 55% -   60% Study Conclusions - Left ventricle: The cavity size was normal. There was mild focal   basal hypertrophy of the septum. Systolic function was normal.   The estimated ejection fraction was in the range of 55% to 60%.   Wall motion was normal; there were no regional wall motion   abnormalities.  The study was not technically sufficient to allow   evaluation of LV diastolic dysfunction due to atrial    fibrillation. - Mitral valve: Calcified annulus. - Left atrium: The atrium was mildly dilated. - Pulmonary arteries: PA peak pressure: 31 mm Hg (S).  2D ECHO: 08/05/2016 LV EF: 30% -   35% Study Conclusions - Procedure narrative: Defibrillation. The rhythm could not be   converted from atrial fibrillation, despite application of 3   shocks. The final rhythm was atrial fibrillation. - Left ventricle: The cavity size was normal. Systolic function was   moderately to severely reduced. The estimated ejection fraction   was in the range of 30% to 35%. Moderate diffuse hypokinesis with   no identifiable regional variations. No evidence of thrombus. - Aortic valve: There was trivial regurgitation. - Mitral valve: There was mild regurgitation directed centrally. - Left atrium: The atrium was severely dilated. No evidence of   thrombus in the atrial cavity or appendage. No evidence of   thrombus in the atrial cavity or appendage. No evidence of   thrombus in the atrial cavity or appendage. There was   moderatecontinuous spontaneous echo contrast (&quot;smoke&quot;) in the   cavity and the appendage. - Atrial septum: No defect or patent foramen ovale was identified. - Tricuspid valve: There was moderate regurgitation directed   centrally. Impressions: - Successful cardioversion. No cardiac source of emboli was   indentified.   ASSESSMENT & PLAN:   PAF: currently on amiodarone 400mg  BID and maintaining NSR (on exam sounds very regular). Will decrease this down to 200mg  daily.  --  Regarding amio, baseline TSH wnl 08/03/16 and LFTs normal 07/17/16. Further monitoring of organ systems while on amiodarone will be at discretion of primary cardiologist. -- Continue Xarelto 20mg  daily for CHADSVASC of 4 (CHF, HTN, age, CAD). BMET today, if creat worsens he may need to be changed to 15mg  daily.  -- Plan is for afib ablation with Dr. [name].  -- He is scheduled for a sleep study in  November  Chronic systolic CHF: Appears euvolemic today -- TEE on 08/05/16 showing EF 30-35% (down from 55% on TTE 07/17/16) with mod TR, mild MR  -- Continue lasix 40mg  daily and Kdur 75mEq daily.  -- Continue Coreg 6.25mg  BID and lisinopril 20mg  daily -- He will need reassessment of his LV function 3 months after regimen is optimized, will plan for late December  HTN: BP has been elevated  And patient increased lisinopril to 20mg  on his own and now much better controlled. Will continue this  CKD: creat 1.97 two weeks ago. BMET today.  CAD: stable. No ASA given Xarelto use. Continue statin and BB. Plavix recently stopped in the setting of epistaxis  HLD: continue statin and fibrate. Recent LDL at goal <70. Per Dr. Debara Pickett, no good evidence for fibrate use on top of statin therapy.   Medication Adjustments/Labs and Tests Ordered: Current medicines are reviewed at length with the patient today.  Concerns regarding medicines are outlined above.  Medication changes, Labs and Tests ordered today are listed in the Patient Instructions below. Patient Instructions  Medication Instructions:  Your physician has recommended you make the following change in your medication:  1.  CHANGE Amiodarone 200 mg taking 1 daily   Labwork: TODAY:  BMET  Testing/Procedures: Your physician has requested that you have an echocardiogram AT [place] [facility]. Echocardiography is a painless test that uses sound waves to create images of your heart. It provides your doctor with  information about the size and shape of your heart and how well your heart's chambers and valves are working. This procedure takes approximately one hour. There are no restrictions for this procedure.   Follow-Up: Your physician recommends that you schedule a follow-up appointment in: SEE [name] AS PLANNED   Any Other Special Instructions Will Be Listed Below (If Applicable). Echocardiogram An echocardiogram, or echocardiography,  uses sound waves (ultrasound) to produce an image of your heart. The echocardiogram is simple, painless, obtained within a short period of time, and offers valuable information to your health care provider. The images from an echocardiogram can provide information such as:  Evidence of coronary artery disease (CAD).  Heart size.  Heart muscle function.  Heart valve function.  Aneurysm detection.  Evidence of a past heart attack.  Fluid buildup around the heart.  Heart muscle thickening.  Assess heart valve function. LET [facility] CARE PROVIDER KNOW ABOUT:  Any allergies you have.  All medicines you are taking, including vitamins, herbs, eye drops, creams, and over-the-counter medicines.  Previous problems you or members of your family have had with the use of anesthetics.  Any blood disorders you have.  Previous surgeries you have had.  Medical conditions you have.  Possibility of pregnancy, if this applies. BEFORE THE PROCEDURE  No special preparation is needed. Eat and drink normally.  PROCEDURE   In order to produce an image of your heart, gel will be applied to your chest and a wand-like tool (transducer) will be moved over your chest. The gel will help transmit the sound waves from the transducer. The sound waves will harmlessly bounce off your heart to allow the heart images to be captured in real-time motion. These images will then be recorded.  You may need an IV to receive a medicine that improves the quality of the pictures. AFTER THE PROCEDURE You may return to your normal schedule including diet, activities, and medicines, unless your health care provider tells you otherwise.   This information is not intended to replace advice given to you by your health care provider. Make sure you discuss any questions you have with your health care provider.   Document Released: 10/25/2000 Document Revised: 11/18/2014 Document Reviewed: 07/05/2013 Elsevier Interactive  Patient Education [employer].     If you need a refill on your cardiac medications before your next appointment, please call your pharmacy.      Signed, Angelena Form, PA-C  09/06/2016 2:55 PM    [address], [place], [place]  [address] Phone: [phone]; Fax: [phone]

## 2016-09-06 ENCOUNTER — Encounter: Payer: Self-pay | Admitting: Physician Assistant

## 2016-09-06 ENCOUNTER — Telehealth: Payer: Self-pay | Admitting: *Deleted

## 2016-09-06 ENCOUNTER — Ambulatory Visit (INDEPENDENT_AMBULATORY_CARE_PROVIDER_SITE_OTHER): Payer: Medicare Other | Admitting: Physician Assistant

## 2016-09-06 VITALS — BP 122/74 | HR 64 | Ht 68.0 in | Wt 216.8 lb

## 2016-09-06 DIAGNOSIS — I2510 Atherosclerotic heart disease of native coronary artery without angina pectoris: Secondary | ICD-10-CM

## 2016-09-06 DIAGNOSIS — I480 Paroxysmal atrial fibrillation: Secondary | ICD-10-CM

## 2016-09-06 DIAGNOSIS — E785 Hyperlipidemia, unspecified: Secondary | ICD-10-CM

## 2016-09-06 DIAGNOSIS — I5022 Chronic systolic (congestive) heart failure: Secondary | ICD-10-CM

## 2016-09-06 DIAGNOSIS — N189 Chronic kidney disease, unspecified: Secondary | ICD-10-CM

## 2016-09-06 DIAGNOSIS — I10 Essential (primary) hypertension: Secondary | ICD-10-CM

## 2016-09-06 MED ORDER — AMIODARONE HCL 200 MG PO TABS: 400.0000 mg | ORAL_TABLET | Freq: Every day | ORAL | 1 refills | Status: DC

## 2016-09-06 NOTE — Patient Instructions (Addendum)
Medication Instructions:  Your physician has recommended you make the following change in your medication:  1.  CHANGE Amiodarone 200 mg taking 1 daily   Labwork: TODAY:  BMET  Testing/Procedures: Your physician has requested that you have an echocardiogram AT [place] [facility]. Echocardiography is a painless test that uses sound waves to create images of your heart. It provides your doctor with information about the size and shape of your heart and how well your heart's chambers and valves are working. This procedure takes approximately one hour. There are no restrictions for this procedure.   Follow-Up: Your physician recommends that you schedule a follow-up appointment in: SEE [name] AS PLANNED   Any Other Special Instructions Will Be Listed Below (If Applicable). Echocardiogram An echocardiogram, or echocardiography, uses sound waves (ultrasound) to produce an image of your heart. The echocardiogram is simple, painless, obtained within a short period of time, and offers valuable information to your health care provider. The images from an echocardiogram can provide information such as:  Evidence of coronary artery disease (CAD).  Heart size.  Heart muscle function.  Heart valve function.  Aneurysm detection.  Evidence of a past heart attack.  Fluid buildup around the heart.  Heart muscle thickening.  Assess heart valve function. LET [facility] CARE PROVIDER KNOW ABOUT:  Any allergies you have.  All medicines you are taking, including vitamins, herbs, eye drops, creams, and over-the-counter medicines.  Previous problems you or members of your family have had with the use of anesthetics.  Any blood disorders you have.  Previous surgeries you have had.  Medical conditions you have.  Possibility of pregnancy, if this applies. BEFORE THE PROCEDURE  No special preparation is needed. Eat and drink normally.  PROCEDURE   In order to produce an image of your  heart, gel will be applied to your chest and a wand-like tool (transducer) will be moved over your chest. The gel will help transmit the sound waves from the transducer. The sound waves will harmlessly bounce off your heart to allow the heart images to be captured in real-time motion. These images will then be recorded.  You may need an IV to receive a medicine that improves the quality of the pictures. AFTER THE PROCEDURE You may return to your normal schedule including diet, activities, and medicines, unless your health care provider tells you otherwise.   This information is not intended to replace advice given to you by your health care provider. Make sure you discuss any questions you have with your health care provider.   Document Released: 10/25/2000 Document Revised: 11/18/2014 Document Reviewed: 07/05/2013 Elsevier Interactive Patient Education [employer].     If you need a refill on your cardiac medications before your next appointment, please call your pharmacy.

## 2016-09-06 NOTE — Telephone Encounter (Signed)
lmtcb -  to schedule AFib/Flutter ablation

## 2016-09-07 LAB — BASIC METABOLIC PANEL
BUN: 28 mg/dL — ABNORMAL HIGH (ref 7–25)
CALCIUM: 9.3 mg/dL (ref 8.6–10.3)
CHLORIDE: 106 mmol/L (ref 98–110)
CO2: 26 mmol/L (ref 20–31)
Creat: 1.89 mg/dL — ABNORMAL HIGH (ref 0.70–1.18)
Glucose, Bld: 70 mg/dL (ref 65–99)
Potassium: 4.3 mmol/L (ref 3.5–5.3)
SODIUM: 143 mmol/L (ref 135–146)

## 2016-09-09 NOTE — Telephone Encounter (Signed)
New message    Pt verbalized that he is returning call for lab

## 2016-09-09 NOTE — Telephone Encounter (Signed)
Pt made aware of his lab results.  He verbalized understanding.

## 2016-09-10 ENCOUNTER — Encounter: Payer: Self-pay | Admitting: Cardiology

## 2016-09-23 ENCOUNTER — Telehealth: Payer: Self-pay | Admitting: Cardiology

## 2016-09-23 NOTE — Telephone Encounter (Signed)
I actually had decreased him to amiodarone 200mg  daily last time I saw him. I will defer to Dr. [name] on next step in managing his afib. I think he is supposed to get an ablation sometime next month

## 2016-09-23 NOTE — Telephone Encounter (Signed)
New Message:    Pt is in atrial fib-

## 2016-09-23 NOTE — Telephone Encounter (Signed)
I spoke to patient. He states he is in afib, started last night.  He states BP was 160/100 and HR 98 last night, 130/95  HR 120 now (1339).  He states his dose of amiodarone was decreased from 800 mg to 200 mg a couple of weeks ago and Lisinopril was increased to 20 mg daily.  I reviewed his medication list and advised him he should be taking amiodarone 200 mg, 2 tablets (400 mg) daily and Lisinopril 10 mg 1 tab twice daily. He states he misunderstood amiodarone instructions and will take another 200 mg (to make 400 mg) now. He states he is taking Lisinopril 20 mg daily in the morning, that's how he took it before, is that ok?  I advised him to be sure he was taking Xarelto 20mg  QD with supper and Carvedilol 6.25 mg twice daily with food.  I advised him I would send the message to physician and call back with any additional recommendations and clarification on Lisinopril. He voiced understanding and thanks.

## 2016-09-25 MED ORDER — AMIODARONE HCL 200 MG PO TABS: 200.0000 mg | ORAL_TABLET | Freq: Every day | ORAL | 3 refills | Status: DC

## 2016-09-25 NOTE — Telephone Encounter (Signed)
I spoke to patient and advised him of dose change, ablation w/ Camnitz, and f/u with [facility]. I apologized for the confusion.  Patient states he is feeling better, has taken 400 mg QD x 2d. I advised him to start the 200 mg dose at next scheduled time. He voiced understanding and thanks.  I also corrected the prescription that was sent in from the 09/06/16 OV for the 400 mg dose. I sent a new rx for 200 mg daily and dc'd the 400 mg dose. Pt also advised and voiced understanding.

## 2016-10-02 ENCOUNTER — Other Ambulatory Visit: Payer: Self-pay | Admitting: Physician Assistant

## 2016-10-02 ENCOUNTER — Telehealth: Payer: Self-pay | Admitting: Cardiology

## 2016-10-02 MED ORDER — LISINOPRIL 20 MG PO TABS: 20.0000 mg | ORAL_TABLET | Freq: Every day | ORAL | 3 refills | Status: DC

## 2016-10-02 MED ORDER — AMIODARONE HCL 200 MG PO TABS: ORAL_TABLET | ORAL | 6 refills | Status: DC

## 2016-10-02 NOTE — Telephone Encounter (Signed)
Pt called requesting a refill on lisinopril. LOV on 09/06/16 lisinopril was increased to 20 mg tablet daily, it was not sent in. I informed pt that I would be sending his Rx to his pharmacy and if he has any other problems, questions or concerns to call the office. Pt verbalized understanding.

## 2016-10-02 NOTE — Telephone Encounter (Signed)
Patient notified of medication change and voiced understanding

## 2016-10-02 NOTE — Telephone Encounter (Signed)
Patient states that he is SOB, and fatigue. Patient reports that SOB started yesterday evening at supper and continued to this morning. Pt. Reports BP of 154/116 Hr 129, BP 137/105 Hr 128.

## 2016-10-02 NOTE — Telephone Encounter (Signed)
New Message  Pt c/o Shortness Of Breath: STAT if SOB developed within the last 24 hours or pt is noticeably SOB on the phone  1. Are you currently SOB (can you hear that pt is SOB on the phone)? Yes  2. How long have you been experiencing SOB? Yesterday and today  3. Are you SOB when sitting or when up moving around? Yes  4. Are you currently experiencing any other symptoms? Pt voiced he is having CP also and been in afib all week,   Pt voiced BP readings for today >>>> 154/116 HR 129 137/105 HR 128

## 2016-10-04 ENCOUNTER — Telehealth: Payer: Self-pay | Admitting: Physician Assistant

## 2016-10-04 NOTE — Telephone Encounter (Signed)
Pt called because he woke up this morning with SOB, then had N&V. Pt feels very ill, cannot walk far at all without feeling severely SOB. Wt gain, not sure how much.  Advised him that since he was acutely ill, he needed to be seen.  With multiple sx, feel he should be tx by EMS. Pt stated he would call.  Rosaria Ferries, Hershal Coria 10/04/2016 8:44 AM Beeper [phone]

## 2016-10-09 ENCOUNTER — Ambulatory Visit (HOSPITAL_BASED_OUTPATIENT_CLINIC_OR_DEPARTMENT_OTHER): Payer: Medicare Other | Attending: Cardiology | Admitting: Cardiovascular Disease

## 2016-10-09 VITALS — Ht 68.0 in | Wt 218.0 lb

## 2016-10-09 DIAGNOSIS — G4733 Obstructive sleep apnea (adult) (pediatric): Secondary | ICD-10-CM | POA: Diagnosis not present

## 2016-10-09 DIAGNOSIS — R5383 Other fatigue: Secondary | ICD-10-CM | POA: Diagnosis not present

## 2016-10-09 DIAGNOSIS — R0683 Snoring: Secondary | ICD-10-CM | POA: Insufficient documentation

## 2016-10-09 DIAGNOSIS — I4819 Other persistent atrial fibrillation: Secondary | ICD-10-CM

## 2016-10-10 DIAGNOSIS — L570 Actinic keratosis: Secondary | ICD-10-CM | POA: Diagnosis not present

## 2016-10-10 DIAGNOSIS — D225 Melanocytic nevi of trunk: Secondary | ICD-10-CM | POA: Diagnosis not present

## 2016-10-10 DIAGNOSIS — L821 Other seborrheic keratosis: Secondary | ICD-10-CM | POA: Diagnosis not present

## 2016-10-10 DIAGNOSIS — Z85828 Personal history of other malignant neoplasm of skin: Secondary | ICD-10-CM | POA: Diagnosis not present

## 2016-10-14 ENCOUNTER — Other Ambulatory Visit: Payer: Self-pay | Admitting: *Deleted

## 2016-10-14 ENCOUNTER — Encounter: Payer: Self-pay | Admitting: Cardiology

## 2016-10-14 DIAGNOSIS — I4819 Other persistent atrial fibrillation: Secondary | ICD-10-CM

## 2016-10-16 ENCOUNTER — Encounter: Payer: Self-pay | Admitting: Cardiology

## 2016-10-16 ENCOUNTER — Encounter: Payer: Self-pay | Admitting: *Deleted

## 2016-10-16 ENCOUNTER — Ambulatory Visit (INDEPENDENT_AMBULATORY_CARE_PROVIDER_SITE_OTHER): Payer: Medicare Other | Admitting: Cardiology

## 2016-10-16 VITALS — BP 126/98 | HR 119 | Ht 68.0 in | Wt 220.0 lb

## 2016-10-16 DIAGNOSIS — Z01812 Encounter for preprocedural laboratory examination: Secondary | ICD-10-CM | POA: Diagnosis not present

## 2016-10-16 DIAGNOSIS — I2510 Atherosclerotic heart disease of native coronary artery without angina pectoris: Secondary | ICD-10-CM | POA: Diagnosis not present

## 2016-10-16 DIAGNOSIS — I481 Persistent atrial fibrillation: Secondary | ICD-10-CM | POA: Diagnosis not present

## 2016-10-16 DIAGNOSIS — I4819 Other persistent atrial fibrillation: Secondary | ICD-10-CM

## 2016-10-16 LAB — CBC WITH DIFFERENTIAL/PLATELET
BASOS PCT: 0 %
Basophils Absolute: 0 cells/uL (ref 0–200)
Eosinophils Absolute: 130 cells/uL (ref 15–500)
Eosinophils Relative: 2 %
HCT: 43.9 % (ref 38.5–50.0)
Hemoglobin: 14.4 g/dL (ref 13.2–17.1)
LYMPHS PCT: 14 %
Lymphs Abs: 910 cells/uL (ref 850–3900)
MCH: 31.5 pg (ref 27.0–33.0)
MCHC: 32.8 g/dL (ref 32.0–36.0)
MCV: 96.1 fL (ref 80.0–100.0)
MONOS PCT: 9 %
MPV: 10.8 fL (ref 7.5–12.5)
Monocytes Absolute: 585 cells/uL (ref 200–950)
Neutro Abs: 4875 cells/uL (ref 1500–7800)
Neutrophils Relative %: 75 %
PLATELETS: 215 10*3/uL (ref 140–400)
RBC: 4.57 MIL/uL (ref 4.20–5.80)
RDW: 14 % (ref 11.0–15.0)
WBC: 6.5 10*3/uL (ref 3.8–10.8)

## 2016-10-16 LAB — BASIC METABOLIC PANEL
BUN: 25 mg/dL (ref 7–25)
CALCIUM: 8.9 mg/dL (ref 8.6–10.3)
CO2: 25 mmol/L (ref 20–31)
CREATININE: 1.72 mg/dL — AB (ref 0.70–1.18)
Chloride: 107 mmol/L (ref 98–110)
Glucose, Bld: 87 mg/dL (ref 65–99)
Potassium: 4.4 mmol/L (ref 3.5–5.3)
Sodium: 142 mmol/L (ref 135–146)

## 2016-10-16 NOTE — Patient Instructions (Addendum)
Medication Instructions:    Your physician recommends that you continue on your current medications as directed. Please refer to the Current Medication list given to you today.  --- If you need a refill on your cardiac medications before your next appointment, please call your pharmacy. ---  Labwork:  Pre procedure labs today: BMET & CBC w/ diff  Testing/Procedures:  None ordered  Follow-Up:  Reschedule your  echocardiogram for January   Your physician recommends that you schedule a follow-up appointment in: 4 weeks, after your procedure on 10/24/16, with Roderic Palau, NP in the AFib clinic.   Your physician recommends that you schedule a follow-up appointment in: 3 months, after your procedure on 10/24/16, with Dr. [name].  Thank you for choosing [facility]!!   Trinidad Curet, RN [phone]   Any Other Special Instructions Will Be Listed Below (If Applicable).  Cardiac Ablation Cardiac ablation is a procedure to stop some heart tissue from causing problems. The heart has many electrical connections. Sometimes these connections cause the heart to beat very fast or irregularly. Removing some of the problem areas can improve heart rhythm or make it normal. Ablation is done for people who:  Have Wolff-Parkinson-White syndrome.  Have other fast heart rhythms (tachycardia).  Have taken medicines for an abnormal heart rhythm (arrhythmia) and the medicines had:  No success.  Side effects.  May have a type of heartbeat that could cause death. What happens before the procedure?  Follow instructions from your doctor about eating and drinking before the procedure.  Take your medicines as told by your doctor. Take them at regular times with water unless told differently by your doctor.  If you are taking diabetes medicine, ask your doctor how to take it. Ask if there are any special instructions you should follow. Your doctor may change how much insulin you take the  day of the procedure. What happens during the procedure?  A special type of X-ray will be used. The X-ray helps your doctor see images of your heart during the procedure.  A small cut (incision) will be made in your neck or groin.  An IV tube will be started before the procedure begins.  You will be given a numbing medicine (anesthetic) or a medicine to help you relax (sedative).  The skin on your neck or groin will be numbed.  A needle will be put into a large vein in your neck or groin.  A thin, flexible tube (catheter) will be put in to reach your heart.  A dye will be put in the tube. The dye will show up on X-rays. It will help your doctor see the area of the heart that needs treatment.  When the heart tissue that is causing problems is found, the tip of the tube will send an electrical current to it. This will stop it from causing problems.  The tube will be taken out.  Pressure will be put on the area where the tube was. This will keep it from bleeding. A bandage will be placed over the area. What happens after the procedure?  You will be taken to a recovery area. Your blood pressure, heart rate, and breathing will be watched. The area where the tube was will also be watched for bleeding.  You will need to lie still for 4-6 hours. This keeps the area where the tube was from bleeding. This information is not intended to replace advice given to you by your health care provider. Make sure you  discuss any questions you have with your health care provider. Document Released: 06/30/2013 Document Revised: 04/04/2016 Document Reviewed: 03/25/2013 Elsevier Interactive Patient Education  2017 [employer].

## 2016-10-16 NOTE — Progress Notes (Addendum)
Electrophysiology Office Note   Date:  10/16/2016   ID:  Peter [name], DOB October 15, 1946, MRN [id number]  PCP:  [name], MD  Cardiologist:  Marlou Porch Primary Electrophysiologist:  Constance Haw, MD    Chief Complaint  Patient presents with  . Follow-up    Persistent AFib     History of Present Illness: Peter [name] is a 70 y.o. male who presents today for electrophysiology evaluation.   History of CAD s/p PCI to LADx3 in 2006, previously chronic diastolic CHF, dyslipidemia, GERD, h/o GIB, HTN, hypertriglyceridemia, iron deficiency anemia, obesity, CKD stage III, recently diagnosed afib who was admitted with persistent afib (CHADSVASC 4). Planned for ablation 10/24/16. He did have cardioversion in the past, but has since gone back to either atrial fibrillation or atrial flutter. He says that he is having episodes of fatigue, and shortness of breath. He also snores at night. His wife says that he may hold his breath when sleeping.  Today, he denies symptoms of palpitations, chest pain, PND, lower extremity edema, claudication, dizziness, presyncope, syncope, bleeding, or neurologic sequela. The patient is tolerating medications without difficulties.    Past Medical History:  Diagnosis Date  . At risk for sleep apnea    STOP-BANG= 5       SENT TO PCP 07-12-2015  . CAD (coronary artery disease)    a. s/p PCI with DES x 3 to LAD in 2006 with bifurcation lesion and Plavix indefinitely was recommended.  . Chronic combined systolic and diastolic CHF (congestive heart failure) ([place])    a. Prior EF normal but EF dropped to 30-35% in 07/2016 in setting of AFIB.  . CKD (chronic kidney disease), stage III   . Dyslipidemia   . Dyspnea on exertion   . GERD (gastroesophageal reflux disease)   . History of GI bleed   . History of kidney stones   . History of MI (myocardial infarction)    11-05-2005--  periprocedural MI  (cardiac cath) per dr [name] note  . Hypertension   .  Hypertriglyceridemia   . Iron deficiency anemia   . Persistent atrial fibrillation ([place])    a. 07/2016, started on Eliquis -> readmitted later that month for AF RVR/acute HF - initially unsuccessful DCCV following TEE, so loaded with amio with repeat successful DCCV 07/2816.  . Right ureteral stone   . S/P drug eluting coronary stent placement    x3  to LAD  2006   Past Surgical History:  Procedure Laterality Date  . CARDIOVASCULAR STRESS TEST  01-06-2013  dr [name]   normal perfusion study/  no ischemia or infarct/  normal LV function and wall motion , ef 57%  . CARDIOVERSION N/A 08/05/2016   Procedure: CARDIOVERSION;  Surgeon: Sanda Klein, MD;  Location: [place];  Service: Cardiovascular;  Laterality: N/A;  . CARDIOVERSION N/A 08/08/2016   Procedure: CARDIOVERSION;  Surgeon: Jerline Pain, MD;  Location[facility] ENDOSCOPY;  Service: Cardiovascular;  Laterality: N/A;  . CORONARY ANGIOPLASTY  11-13-2005 dr [name]   Successful touch up post-dilatation of the 3 tandem overlying Cypher stents in proximal to mid LAD (based on IVUS 0% stenosis and patent diagonal branch)/  Cutting Balloon Angioplasty of Ramus branch  . CORONARY ANGIOPLASTY WITH STENT PLACEMENT  11-05-2005   dr [name]   PCI and DES x3 to birfurcation lesion LAD and diagonal branch of LAD/   20% dLM,  30-40% RCA, ef 60%  . CYSTOSCOPY WITH RETROGRADE PYELOGRAM, URETEROSCOPY AND STENT PLACEMENT Right 07/14/2015  Procedure: CYSTOSCOPY WITH RETROGRADE PYELOGRAM, URETEROSCOPY AND STENT PLACEMENT;  Surgeon: Alexis Frock, MD;  Location: [facility];  Service: Urology;  Laterality: Right;  . EXTRACORPOREAL SHOCK WAVE LITHOTRIPSY  yrs ago  . LEFT URETEROSCOPIC STONE EXTRACTION  04-10-2006  . STONE EXTRACTION WITH BASKET Right 07/14/2015   Procedure: STONE EXTRACTION WITH BASKET;  Surgeon: Alexis Frock, MD;  Location[facility];  Service: Urology;  Laterality: Right;  . TEE WITHOUT CARDIOVERSION N/A  08/05/2016   Procedure: TRANSESOPHAGEAL ECHOCARDIOGRAM (TEE);  Surgeon: Sanda Klein, MD;  Location: [facility] ENDOSCOPY;  Service: Cardiovascular;  Laterality: N/A;  . TRANSTHORACIC ECHOCARDIOGRAM  02-04-2012   grade I diastolic dysfunction/  ef 55-60%/  trivial MR and TR/  mild LAE     Current Outpatient Prescriptions  Medication Sig Dispense Refill  . amiodarone (PACERONE) 200 MG tablet Take 400 mg Two Times Daily for One Week. Then Restart 200 mg Daily. 48 tablet 6  . atorvastatin (LIPITOR) 40 MG tablet Take 1 tablet (40 mg total) by mouth every morning. 90 tablet 3  . carvedilol (COREG) 6.25 MG tablet Take 1 tablet (6.25 mg total) by mouth 2 (two) times daily with a meal. 60 tablet 1  . diphenhydramine-acetaminophen (TYLENOL PM) 25-500 MG TABS tablet Take 1 tablet by mouth at bedtime.    . fenofibrate (TRICOR) 145 MG tablet Take 145 mg by mouth daily.    . ferrous sulfate 325 (65 FE) MG tablet Take 325 mg by mouth every morning.     . furosemide (LASIX) 40 MG tablet Take 1 tablet (40 mg total) by mouth daily. 30 tablet 3  . lisinopril (PRINIVIL,ZESTRIL) 20 MG tablet Take 1 tablet (20 mg total) by mouth daily. 90 tablet 3  . nitroGLYCERIN (NITROSTAT) 0.4 MG SL tablet Place 1 tablet (0.4 mg total) under the tongue every 5 (five) minutes as needed for chest pain. 25 tablet prn  . pantoprazole (PROTONIX) 40 MG tablet Take 40 mg by mouth daily.     . potassium chloride SA (K-DUR,KLOR-CON) 20 MEQ tablet Take 1 tablet (20 mEq total) by mouth daily. 30 tablet 3  . rivaroxaban (XARELTO) 20 MG TABS tablet Take 1 tablet (20 mg total) by mouth daily with supper. 30 tablet 11   No current facility-administered medications for this visit.     Allergies:   Patient has no known allergies.   Social History:  The patient  reports that he has never smoked. He has never used smokeless tobacco. He reports that he does not drink alcohol or use drugs.   Family History:  The patient's family history includes  Heart attack (age of onset: 22) in his father.    ROS:  Please see the history of present illness.   Otherwise, review of systems is positive for none.   All other systems are reviewed and negative.    PHYSICAL EXAM: VS:  BP (!) 126/98   Pulse (!) 119   Ht 5\' 8"  (1.727 m)   Wt 220 lb (99.8 kg)   BMI 33.45 kg/m  , BMI Body mass index is 33.45 kg/m. GEN: Well nourished, well developed, in no acute distress  HEENT: normal  Neck: no JVD, carotid bruits, or masses Cardiac: tachycardic, regular; no murmurs, rubs, or gallops,no edema  Respiratory:  clear to auscultation bilaterally, normal work of breathing GI: soft, nontender, nondistended, + BS MS: no deformity or atrophy  Skin: warm and dry Neuro:  Strength and sensation are intact Psych: euthymic mood, full affect  EKG:  EKG is ordered today. Personal review of the ekg ordered shows atrial flutter, LAFB, rate 118, lateral Q waves  Recent Labs: 07/17/2016: ALT 22 08/03/2016: B Natriuretic Peptide 414.8; TSH 0.871 08/05/2016: Magnesium 1.9 08/19/2016: Hemoglobin 14.8; Platelets 224 09/06/2016: BUN 28; Creat 1.89; Potassium 4.3; Sodium 143    Lipid Panel     Component Value Date/Time   CHOL 140 01/01/2013 1037   TRIG 230.0 (H) 01/01/2013 1037   HDL 38.20 (L) 01/01/2013 1037   CHOLHDL 4 01/01/2013 1037   VLDL 46.0 (H) 01/01/2013 1037   LDLDIRECT 68.6 01/01/2013 1037     Wt Readings from Last 3 Encounters:  10/16/16 220 lb (99.8 kg)  10/09/16 218 lb (98.9 kg)  09/06/16 216 lb 12.8 oz (98.3 kg)      Other studies Reviewed: Additional studies/ records that were reviewed today include: TEE 08/05/16  Review of the above records today demonstrates:  - Left ventricle: The cavity size was normal. Systolic function was   moderately to severely reduced. The estimated ejection fraction   was in the range of 30% to 35%. Moderate diffuse hypokinesis with   no identifiable regional variations. No evidence of thrombus. - Aortic  valve: There was trivial regurgitation. - Mitral valve: There was mild regurgitation directed centrally. - Left atrium: The atrium was severely dilated. No evidence of   thrombus in the atrial cavity or appendage. No evidence of   thrombus in the atrial cavity or appendage. No evidence of   thrombus in the atrial cavity or appendage. There was   moderatecontinuous spontaneous echo contrast (&quot;smoke&quot;) in the   cavity and the appendage. - Atrial septum: No defect or patent foramen ovale was identified. - Tricuspid valve: There was moderate regurgitation directed   centrally.   ASSESSMENT AND PLAN:  1.  Persistent atrial fibrillation/flutter: Currently on Xarelto, amiodarone, with 3 cardioversions in the past. AF ablation planned 10/24/16. Risks and benefits were discussed. Risks include bleeding, tamponade, heart block, stroke, and damage to surrounding organs. We Gyanna Jarema check preprocedure labs today and plan for CT scan next week.  This patients CHA2DS2-VASc Score and unadjusted Ischemic Stroke Rate (% per year) is equal to 4.8 % stroke rate/year from a score of 4  Above score calculated as 1 point each if present [CHF, HTN, DM, Vascular=MI/PAD/Aortic Plaque, Age if 65-74, or Male] Above score calculated as 2 points each if present [Age > 75, or Stroke/TIA/TE]  2. Likely nonischemic cardiomyopathy: EF is 30-35% on his TEE from the end of September. It was normal on 9/6. Currently he is on Coreg and lisinopril and is taking Lasix. He does occasionally get short of breath. I told him to weigh himself daily and take an extra dose of Lasix should he need it. We Elide Stalzer check a basic metabolic today. It is likely that all of his current symptoms are related to atrial flutter.  3. HTN: Well controlled today  4. CAD s/p PCI to LAD in 2006: Currently asymptomatic  5. Snoring: sleep study ordered to assess for OSA.  Therapy if OSA is present Kambra Beachem help to treat AF.  Current medicines are  reviewed at length with the patient today.   The patient does not have concerns regarding his medicines.  The following changes were made today:  none  Labs/ tests ordered today include:  Orders Placed This Encounter  Procedures  . Basic metabolic panel  . CBC w/Diff     Disposition:   FU with Krishan Mcbreen 3  months  Signed, Lois Ostrom Meredith Leeds, MD  10/16/2016 10:38 AM     [facility] [address] [place] [address] [phone] (office) [phone] (fax)

## 2016-10-17 ENCOUNTER — Telehealth: Payer: Self-pay | Admitting: Cardiology

## 2016-10-17 NOTE — Telephone Encounter (Signed)
F/u Message ° °Pt returning RN call. Please call back to discuss  °

## 2016-10-17 NOTE — Telephone Encounter (Signed)
Requesting sleep study results.  Informed pt study had not been read yet.  Explained that I will send message to reading physician to request study findings. Pt thanks me for helping.

## 2016-10-23 ENCOUNTER — Encounter (HOSPITAL_COMMUNITY): Payer: Self-pay | Admitting: Interventional Radiology

## 2016-10-23 ENCOUNTER — Other Ambulatory Visit: Payer: Self-pay | Admitting: Cardiology

## 2016-10-23 ENCOUNTER — Ambulatory Visit (HOSPITAL_COMMUNITY)
Admission: RE | Admit: 2016-10-23 | Discharge: 2016-10-23 | Disposition: A | Discharge status: Home / Self Care | Payer: Medicare Other | Source: Ambulatory Visit | Attending: Cardiology | Admitting: Cardiology

## 2016-10-23 DIAGNOSIS — I4819 Other persistent atrial fibrillation: Secondary | ICD-10-CM

## 2016-10-23 DIAGNOSIS — I481 Persistent atrial fibrillation: Secondary | ICD-10-CM | POA: Insufficient documentation

## 2016-10-23 DIAGNOSIS — I480 Paroxysmal atrial fibrillation: Secondary | ICD-10-CM | POA: Diagnosis not present

## 2016-10-23 DIAGNOSIS — Z452 Encounter for adjustment and management of vascular access device: Secondary | ICD-10-CM | POA: Diagnosis not present

## 2016-10-23 HISTORY — PX: IR GENERIC HISTORICAL: IMG1180011

## 2016-10-23 MED ORDER — LIDOCAINE HCL 1 % IJ SOLN
INTRAMUSCULAR | Status: AC
  Filled 2016-10-23: qty 20

## 2016-10-23 MED ORDER — METOPROLOL TARTRATE 5 MG/5ML IV SOLN
5.0000 mg | INTRAVENOUS | Status: AC | PRN
  Administered 2016-10-23 (×2): 5 mg via INTRAVENOUS

## 2016-10-23 MED ORDER — METOPROLOL TARTRATE 5 MG/5ML IV SOLN
INTRAVENOUS | Status: AC
  Administered 2016-10-23: 5 mg via INTRAVENOUS
  Filled 2016-10-23: qty 10

## 2016-10-23 MED ORDER — IOPAMIDOL (ISOVUE-370) INJECTION 76%
INTRAVENOUS | Status: AC
  Administered 2016-10-23: 100 mL
  Filled 2016-10-23: qty 100

## 2016-10-23 NOTE — Progress Notes (Signed)
IV team unable to thread IV catheter. Patient was given a PICC line as per Dr Annamaria Boots so that he has it for his procedure (ablation for A Fib) tomorrow. Dressing is clean dry and intact to PICC right arm.  Patient and wife know to keep dressing dry and that it will be removed tomorrow prior to going home. VS post CT heart were stable and patient felt fine. Discharged at 1235 walking with wife.

## 2016-10-23 NOTE — Progress Notes (Signed)
IV team also unable to thread IV for CT heart. Patient agreeable to micropuncture in IR. Wife in waiting room has been kept updated. Patient in IR for procedure.

## 2016-10-23 NOTE — Procedures (Signed)
Atrial fibrillation, poor peripheral veins  S/p LUE  POWER PICC   TIP SVCRA NO COMP STABLE READY FOR USE FULL REPORT IN PACS

## 2016-10-24 ENCOUNTER — Encounter (HOSPITAL_COMMUNITY)
Admission: RE | Disposition: A | Discharge status: Home / Self Care | Payer: Self-pay | Source: Ambulatory Visit | Attending: Cardiology

## 2016-10-24 ENCOUNTER — Ambulatory Visit (HOSPITAL_COMMUNITY): Payer: Medicare Other | Admitting: Certified Registered"

## 2016-10-24 ENCOUNTER — Encounter (HOSPITAL_COMMUNITY): Payer: Self-pay | Admitting: Certified Registered"

## 2016-10-24 ENCOUNTER — Ambulatory Visit (HOSPITAL_COMMUNITY)
Admission: RE | Admit: 2016-10-24 | Discharge: 2016-10-25 | Disposition: A | Discharge status: Home / Self Care | Payer: Medicare Other | Source: Ambulatory Visit | Attending: Cardiology | Admitting: Cardiology

## 2016-10-24 DIAGNOSIS — E785 Hyperlipidemia, unspecified: Secondary | ICD-10-CM | POA: Insufficient documentation

## 2016-10-24 DIAGNOSIS — I4891 Unspecified atrial fibrillation: Secondary | ICD-10-CM | POA: Diagnosis not present

## 2016-10-24 DIAGNOSIS — E669 Obesity, unspecified: Secondary | ICD-10-CM | POA: Diagnosis not present

## 2016-10-24 DIAGNOSIS — N289 Disorder of kidney and ureter, unspecified: Secondary | ICD-10-CM | POA: Insufficient documentation

## 2016-10-24 DIAGNOSIS — I509 Heart failure, unspecified: Secondary | ICD-10-CM | POA: Insufficient documentation

## 2016-10-24 DIAGNOSIS — I110 Hypertensive heart disease with heart failure: Secondary | ICD-10-CM | POA: Diagnosis not present

## 2016-10-24 DIAGNOSIS — Z79899 Other long term (current) drug therapy: Secondary | ICD-10-CM | POA: Diagnosis not present

## 2016-10-24 DIAGNOSIS — I480 Paroxysmal atrial fibrillation: Secondary | ICD-10-CM | POA: Diagnosis present

## 2016-10-24 DIAGNOSIS — I4892 Unspecified atrial flutter: Secondary | ICD-10-CM | POA: Insufficient documentation

## 2016-10-24 DIAGNOSIS — K219 Gastro-esophageal reflux disease without esophagitis: Secondary | ICD-10-CM | POA: Insufficient documentation

## 2016-10-24 DIAGNOSIS — Z6833 Body mass index (BMI) 33.0-33.9, adult: Secondary | ICD-10-CM | POA: Diagnosis not present

## 2016-10-24 DIAGNOSIS — I481 Persistent atrial fibrillation: Secondary | ICD-10-CM | POA: Insufficient documentation

## 2016-10-24 DIAGNOSIS — I429 Cardiomyopathy, unspecified: Secondary | ICD-10-CM | POA: Insufficient documentation

## 2016-10-24 DIAGNOSIS — I2510 Atherosclerotic heart disease of native coronary artery without angina pectoris: Secondary | ICD-10-CM | POA: Diagnosis not present

## 2016-10-24 DIAGNOSIS — Z7901 Long term (current) use of anticoagulants: Secondary | ICD-10-CM | POA: Insufficient documentation

## 2016-10-24 DIAGNOSIS — I252 Old myocardial infarction: Secondary | ICD-10-CM | POA: Insufficient documentation

## 2016-10-24 DIAGNOSIS — D649 Anemia, unspecified: Secondary | ICD-10-CM | POA: Diagnosis not present

## 2016-10-24 DIAGNOSIS — I483 Typical atrial flutter: Secondary | ICD-10-CM | POA: Diagnosis not present

## 2016-10-24 HISTORY — PX: ELECTROPHYSIOLOGIC STUDY: SHX172A

## 2016-10-24 LAB — POCT ACTIVATED CLOTTING TIME
Activated Clotting Time: 318 seconds
Activated Clotting Time: 329 seconds
Activated Clotting Time: 285 seconds
Activated Clotting Time: 290 seconds
Activated Clotting Time: 142 seconds

## 2016-10-24 LAB — MRSA PCR SCREENING: MRSA by PCR: NEGATIVE

## 2016-10-24 SURGERY — ATRIAL FIBRILLATION ABLATION
Anesthesia: Monitor Anesthesia Care

## 2016-10-24 MED ORDER — POTASSIUM CHLORIDE CRYS ER 20 MEQ PO TBCR
20.0000 meq | EXTENDED_RELEASE_TABLET | Freq: Every day | ORAL | Status: DC
  Administered 2016-10-24 – 2016-10-25 (×2): 20 meq via ORAL
  Filled 2016-10-24: qty 1

## 2016-10-24 MED ORDER — SODIUM CHLORIDE 0.9 % IV SOLN: 250.0000 mL | INTRAVENOUS | Status: DC | PRN

## 2016-10-24 MED ORDER — FUROSEMIDE 40 MG PO TABS
40.0000 mg | ORAL_TABLET | Freq: Every day | ORAL | Status: DC
  Administered 2016-10-24 – 2016-10-25 (×2): 40 mg via ORAL
  Filled 2016-10-24 (×2): qty 1

## 2016-10-24 MED ORDER — DIPHENHYDRAMINE-APAP (SLEEP) 25-500 MG PO TABS: 1.0000 | ORAL_TABLET | Freq: Every day | ORAL | Status: DC

## 2016-10-24 MED ORDER — LISINOPRIL 20 MG PO TABS
20.0000 mg | ORAL_TABLET | Freq: Every day | ORAL | Status: DC
  Administered 2016-10-24 – 2016-10-25 (×2): 20 mg via ORAL
  Filled 2016-10-24 (×2): qty 1

## 2016-10-24 MED ORDER — SODIUM CHLORIDE 0.9% FLUSH: 3.0000 mL | INTRAVENOUS | Status: DC | PRN

## 2016-10-24 MED ORDER — LIDOCAINE-EPINEPHRINE 1 %-1:100000 IJ SOLN
INTRAMUSCULAR | Status: AC
  Filled 2016-10-24: qty 1

## 2016-10-24 MED ORDER — ATORVASTATIN CALCIUM 40 MG PO TABS
40.0000 mg | ORAL_TABLET | Freq: Every morning | ORAL | Status: DC
  Administered 2016-10-24 – 2016-10-25 (×2): 40 mg via ORAL
  Filled 2016-10-24 (×2): qty 1

## 2016-10-24 MED ORDER — HEPARIN SODIUM (PORCINE) 1000 UNIT/ML IJ SOLN
INTRAMUSCULAR | Status: DC | PRN
  Administered 2016-10-24 (×2): 1000 [IU] via INTRAVENOUS

## 2016-10-24 MED ORDER — FERROUS SULFATE 325 (65 FE) MG PO TABS
325.0000 mg | ORAL_TABLET | Freq: Every morning | ORAL | Status: DC
  Administered 2016-10-25: 325 mg via ORAL
  Filled 2016-10-24: qty 1

## 2016-10-24 MED ORDER — DOBUTAMINE-DEXTROSE 2-5 MG/ML-% IV SOLN
INTRAVENOUS | Status: DC | PRN
  Administered 2016-10-24: 20 ug/kg/min via INTRAVENOUS

## 2016-10-24 MED ORDER — ACETAMINOPHEN 325 MG PO TABS
650.0000 mg | ORAL_TABLET | ORAL | Status: DC | PRN
  Administered 2016-10-24: 650 mg via ORAL
  Filled 2016-10-24: qty 2

## 2016-10-24 MED ORDER — ADENOSINE 6 MG/2ML IV SOLN
INTRAVENOUS | Status: AC
  Filled 2016-10-24: qty 2

## 2016-10-24 MED ORDER — AMIODARONE HCL 200 MG PO TABS
200.0000 mg | ORAL_TABLET | Freq: Every day | ORAL | Status: DC
  Administered 2016-10-24 – 2016-10-25 (×2): 200 mg via ORAL
  Filled 2016-10-24 (×2): qty 1

## 2016-10-24 MED ORDER — SODIUM CHLORIDE 0.9% FLUSH: 10.0000 mL | INTRAVENOUS | Status: DC | PRN

## 2016-10-24 MED ORDER — PROPOFOL 10 MG/ML IV BOLUS
INTRAVENOUS | Status: DC | PRN
  Administered 2016-10-24: 30 mg via INTRAVENOUS
  Administered 2016-10-24: 150 mg via INTRAVENOUS

## 2016-10-24 MED ORDER — FENTANYL CITRATE (PF) 100 MCG/2ML IJ SOLN
INTRAMUSCULAR | Status: DC | PRN
  Administered 2016-10-24 (×2): 50 ug via INTRAVENOUS

## 2016-10-24 MED ORDER — SODIUM CHLORIDE 0.9% FLUSH
10.0000 mL | Freq: Two times a day (BID) | INTRAVENOUS | Status: DC
Start: 2016-10-24 — End: 2016-10-25
  Administered 2016-10-24 – 2016-10-25 (×2): 10 mL

## 2016-10-24 MED ORDER — CARVEDILOL 6.25 MG PO TABS
6.2500 mg | ORAL_TABLET | Freq: Two times a day (BID) | ORAL | Status: DC
  Administered 2016-10-24 – 2016-10-25 (×2): 6.25 mg via ORAL
  Filled 2016-10-24 (×2): qty 1

## 2016-10-24 MED ORDER — DOBUTAMINE IN D5W 4-5 MG/ML-% IV SOLN
INTRAVENOUS | Status: AC
  Filled 2016-10-24: qty 250

## 2016-10-24 MED ORDER — HEPARIN (PORCINE) IN NACL 2-0.9 UNIT/ML-% IJ SOLN
INTRAMUSCULAR | Status: AC
  Filled 2016-10-24: qty 500

## 2016-10-24 MED ORDER — SODIUM CHLORIDE 0.9% FLUSH
3.0000 mL | Freq: Two times a day (BID) | INTRAVENOUS | Status: DC
  Administered 2016-10-24 – 2016-10-25 (×2): 3 mL via INTRAVENOUS

## 2016-10-24 MED ORDER — BUPIVACAINE HCL (PF) 0.25 % IJ SOLN
INTRAMUSCULAR | Status: AC
  Filled 2016-10-24: qty 60

## 2016-10-24 MED ORDER — HEPARIN SODIUM (PORCINE) 1000 UNIT/ML IJ SOLN
INTRAMUSCULAR | Status: AC
  Filled 2016-10-24: qty 1

## 2016-10-24 MED ORDER — RIVAROXABAN 20 MG PO TABS
20.0000 mg | ORAL_TABLET | Freq: Every day | ORAL | Status: DC
  Administered 2016-10-24: 20 mg via ORAL
  Filled 2016-10-24: qty 1

## 2016-10-24 MED ORDER — PROTAMINE SULFATE 10 MG/ML IV SOLN
INTRAVENOUS | Status: DC | PRN
  Administered 2016-10-24: 40 mg via INTRAVENOUS

## 2016-10-24 MED ORDER — SUCCINYLCHOLINE CHLORIDE 200 MG/10ML IV SOSY
PREFILLED_SYRINGE | INTRAVENOUS | Status: DC | PRN
  Administered 2016-10-24: 120 mg via INTRAVENOUS

## 2016-10-24 MED ORDER — PHENYLEPHRINE 40 MCG/ML (10ML) SYRINGE FOR IV PUSH (FOR BLOOD PRESSURE SUPPORT)
PREFILLED_SYRINGE | INTRAVENOUS | Status: DC | PRN
  Administered 2016-10-24 (×3): 80 ug via INTRAVENOUS

## 2016-10-24 MED ORDER — LACTATED RINGERS IV SOLN
INTRAVENOUS | Status: DC | PRN
  Administered 2016-10-24: 08:00:00 via INTRAVENOUS

## 2016-10-24 MED ORDER — NITROGLYCERIN 0.4 MG SL SUBL: 0.4000 mg | SUBLINGUAL_TABLET | SUBLINGUAL | Status: DC | PRN

## 2016-10-24 MED ORDER — LIDOCAINE-EPINEPHRINE 1 %-1:100000 IJ SOLN
30.0000 mL | Freq: Once | INTRAMUSCULAR | Status: AC
  Administered 2016-10-24: 0.8 mL

## 2016-10-24 MED ORDER — BUPIVACAINE HCL (PF) 0.25 % IJ SOLN
INTRAMUSCULAR | Status: DC | PRN
  Administered 2016-10-24: 40 mL

## 2016-10-24 MED ORDER — MIDAZOLAM HCL 5 MG/5ML IJ SOLN
INTRAMUSCULAR | Status: DC | PRN
  Administered 2016-10-24: 2 mg via INTRAVENOUS

## 2016-10-24 MED ORDER — HEPARIN SODIUM (PORCINE) 1000 UNIT/ML IJ SOLN
INTRAMUSCULAR | Status: DC | PRN
  Administered 2016-10-24: 14000 [IU] via INTRAVENOUS
  Administered 2016-10-24 (×2): 1000 [IU] via INTRAVENOUS
  Administered 2016-10-24: 2000 [IU] via INTRAVENOUS

## 2016-10-24 MED ORDER — ZOLPIDEM TARTRATE 5 MG PO TABS
5.0000 mg | ORAL_TABLET | Freq: Every evening | ORAL | Status: DC | PRN
  Administered 2016-10-24: 5 mg via ORAL
  Filled 2016-10-24: qty 1

## 2016-10-24 MED ORDER — PHENYLEPHRINE HCL 10 MG/ML IJ SOLN
INTRAVENOUS | Status: DC | PRN
  Administered 2016-10-24: 40 ug/min via INTRAVENOUS

## 2016-10-24 MED ORDER — ONDANSETRON HCL 4 MG/2ML IJ SOLN: 4.0000 mg | Freq: Four times a day (QID) | INTRAMUSCULAR | Status: DC | PRN

## 2016-10-24 MED ORDER — HEPARIN (PORCINE) IN NACL 2-0.9 UNIT/ML-% IJ SOLN
INTRAMUSCULAR | Status: DC | PRN
  Administered 2016-10-24 (×4): 500 mL

## 2016-10-24 MED ORDER — PANTOPRAZOLE SODIUM 40 MG PO TBEC
40.0000 mg | DELAYED_RELEASE_TABLET | Freq: Every day | ORAL | Status: DC
  Administered 2016-10-24 – 2016-10-25 (×2): 40 mg via ORAL
  Filled 2016-10-24 (×2): qty 1

## 2016-10-24 SURGICAL SUPPLY — 19 items
BAG SNAP BAND KOVER 36X36 (MISCELLANEOUS) ×3 IMPLANT
BLANKET WARM UNDERBOD FULL ACC (MISCELLANEOUS) ×3 IMPLANT
CATH SMTCH THERMOCOOL SF DF (CATHETERS) ×2 IMPLANT
CATH SOUNDSTAR 3D IMAGING (CATHETERS) ×2 IMPLANT
CATH VARIABLE LASSO NAV 2515 (CATHETERS) ×2 IMPLANT
CATH WEBSTER BI DIR CS D-F CRV (CATHETERS) ×2 IMPLANT
NDL TRANSSEPTAL BRK 98CM (NEEDLE) IMPLANT
NEEDLE TRANSSEPTAL BRK 98CM (NEEDLE) ×3 IMPLANT
PACK EP LATEX FREE (CUSTOM PROCEDURE TRAY) ×3
PACK EP LF (CUSTOM PROCEDURE TRAY) ×1 IMPLANT
PAD DEFIB LIFELINK (PAD) ×3 IMPLANT
PATCH CARTO3 (PAD) ×2 IMPLANT
SHEATH AGILIS NXT 8.5F 71CM (SHEATH) ×4 IMPLANT
SHEATH AVANTI 11F 11CM (SHEATH) ×2 IMPLANT
SHEATH PINNACLE 7F 10CM (SHEATH) ×2 IMPLANT
SHEATH PINNACLE 8F 10CM (SHEATH) ×4 IMPLANT
SHEATH PINNACLE 9F 10CM (SHEATH) ×4 IMPLANT
SHIELD RADPAD SCOOP 12X17 (MISCELLANEOUS) ×3 IMPLANT
TUBING SMART ABLATE COOLFLOW (TUBING) ×2 IMPLANT

## 2016-10-24 NOTE — Progress Notes (Signed)
Site area: LFV x 2 Site Prior to Removal:  Level 0 Pressure Applied For:30 min Manual:  yes  Patient Status During Pull:  stable Post Pull Site:  Level 0 Post Pull Instructions Given:  yes Post Pull Pulses Present: palpable Dressing Applied:  tegaderm Bedrest begins @ 1330 till 1930 Comments:persistant capillary ooz

## 2016-10-24 NOTE — Anesthesia Procedure Notes (Signed)
Procedure Name: Intubation Date/Time: 10/24/2016 8:02 AM Performed by: Melina Copa, Nadya Hopwood R Pre-anesthesia Checklist: Patient identified, Emergency Drugs available, Suction available and Patient being monitored Patient Re-evaluated:Patient Re-evaluated prior to inductionOxygen Delivery Method: Circle System Utilized Preoxygenation: Pre-oxygenation with 100% oxygen Intubation Type: IV induction Ventilation: Mask ventilation without difficulty Laryngoscope Size: Mac and 4 Grade View: Grade I Tube type: Oral Tube size: 7.5 mm Number of attempts: 1 Airway Equipment and Method: Stylet Placement Confirmation: ETT inserted through vocal cords under direct vision,  positive ETCO2 and breath sounds checked- equal and bilateral Secured at: 21 cm Tube secured with: Tape Dental Injury: Teeth and Oropharynx as per pre-operative assessment

## 2016-10-24 NOTE — Progress Notes (Addendum)
New pressure dressing saturated at right groin site.  Guaze removed and pressure held for a total of 35 minutes. Pressure dressing applied.

## 2016-10-24 NOTE — Anesthesia Preprocedure Evaluation (Addendum)
Anesthesia Evaluation  Patient identified by MRN, date of birth, ID band Patient awake    Reviewed: Allergy & Precautions, NPO status , Patient's Chart, lab work & pertinent test results  History of Anesthesia Complications Negative for: history of anesthetic complications  Airway Mallampati: II  TM Distance: <3 FB Neck ROM: Full    Dental  (+) Teeth Intact, Dental Advisory Given   Pulmonary neg pulmonary ROS,    breath sounds clear to auscultation       Cardiovascular hypertension, + CAD, + Past MI, +CHF and + DOE   Rhythm:Irregular Rate:Tachycardia     Neuro/Psych negative neurological ROS     GI/Hepatic GERD  ,  Endo/Other    Renal/GU Renal disease     Musculoskeletal   Abdominal (+) + obese,   Peds  Hematology   Anesthesia Other Findings   Reproductive/Obstetrics                             Anesthesia Physical Anesthesia Plan  ASA: III  Anesthesia Plan: General   Post-op Pain Management:    Induction: Intravenous  Airway Management Planned: Oral ETT  Additional Equipment:   Intra-op Plan:   Post-operative Plan: Extubation in OR  Informed Consent:   Plan Discussed with: CRNA  Anesthesia Plan Comments:         Anesthesia Quick Evaluation

## 2016-10-24 NOTE — Progress Notes (Signed)
During 15 minutes site check, right groin site guaze saturated. Cards Fellow called and notified. Verbal orders to apply a Fem Stop to site.  2100: Cards Fellow at bedside to assess right groin site. Plan is to still use Fem Stop on site. If oozing does not stop, will notify Cards Fellow.

## 2016-10-24 NOTE — Progress Notes (Signed)
Upon assessment, right groin femoral site guaze saturated with blood. Guaze removed and new guaze and pressure dressing applied.

## 2016-10-24 NOTE — Discharge Summary (Signed)
ELECTROPHYSIOLOGY PROCEDURE DISCHARGE SUMMARY    Patient ID: Peter [name],  MRN: [id number], DOB/AGE: [birth year] 70 y.o.  Admit date: 10/24/2016 Discharge date: 10/25/16  Primary Care Physician: [name], MD  Primary Cardiologist: [name] Electrophysiologist: Dr. [name]  Primary Discharge Diagnosis:  1. Persistent Afib/flutter     CHA2DS2Vasc is at least 4, on Xarelto  Secondary Discharge Diagnosis:  1. CAD 2. CM, felt likely to be non-ischemic 3. HTN 4. HLD   Procedures This Admission:  1.  Electrophysiology study and radiofrequency catheter ablation on 10/24/16 by Dr [name].   This study demonstrated   CONCLUSIONS: 1. Sinus rhythm upon presentation.   2. Successful electrical isolation and anatomical encircling of all four pulmonary veins with radiofrequency current.    3. Cavo-tricuspid isthmus ablation was performed with complete bidirectional isthmus block achieved.  4. No inducible arrhythmias following ablation both on and off of dobutamine 5. No early apparent complications.  Brief HPI: Peter [name] is a 70 y.o. male with a history of persistent atrial fibrillation.  They have failed medical therapy with amiodarone. Risks, benefits, and alternatives to catheter ablation of atrial fibrillation were reviewed with the patient who wished to proceed.  The patient underwent cardiac CT prior to the procedure which demonstrated no LAA thrombus.    Hospital Course:  The patient was admitted and underwent EPS/RFCA of atrial fibrillation with details as outlined above.  He was monitored on telemetry overnight which demonstrated SR.  The R groin site had persisitent oozing the evening of the procedure requiring FemStop placement, the L side site was/remained stable.  Today, the morning of discharge the Fem Stop removed and bed rest was maintained until the afternoon.  The site had no ongoing bleeding he was allowed to ambulate without recurrent bleeding.   The  patient was examined by Dr. [name] and considered to be stable for discharge.  Wound care and activity restrictions were reviewed with the patient.  The patient Peter [name] be seen back by Peter Palau, NP in 4 weeks and Dr [name] in 12 weeks for post ablation follow up.      Physical Exam: Vitals:   10/25/16 0700 10/25/16 0803 10/25/16 1122 10/25/16 1200  BP: (!) 144/97 136/82 117/83 134/81  Pulse: 78 73 69 70  Resp: 18 (!) 22 17 20   Temp:  98.6 F (37 C) 98.2 F (36.8 C)   TempSrc:  Oral Oral   SpO2: 96% 96% 98% 95%  Weight:      Height:        GEN- The patient is well appearing, alert and oriented x 3 today.   HEENT: normocephalic, atraumatic; sclera clear, conjunctiva pink; hearing intact; oropharynx clear; neck supple  Lungs- Clear to ausculation bilaterally, normal work of breathing.  No wheezes, rales, rhonchi Heart- Regular rate and rhythm, no murmurs, rubs or gallops  GI- soft, non-tender, non-distended  Extremities- no clubbing, cyanosis, or edema; DP/PT 2+ bilaterally, b/l groin sites without hematoma/bruit, R side with minimal ecchymosis, no bleeding or post ambulation MS- no significant deformity or atrophy Skin- warm and dry, no rash or lesion Psych- euthymic mood, full affect Neuro- strength and sensation are intact   Labs:   Lab Results  Component Value Date   WBC 9.5 10/25/2016   HGB 13.4 10/25/2016   HCT 41.9 10/25/2016   MCV 99.5 10/25/2016   PLT 166 10/25/2016   No results for input(s): NA, K, CL, CO2, BUN, CREATININE, CALCIUM, PROT, BILITOT, ALKPHOS, ALT, AST, GLUCOSE in  the last 168 hours.  Invalid input(s): LABALBU   Discharge Medications:  Allergies as of 10/25/2016   No Known Allergies     Medication List    TAKE these medications   amiodarone 200 MG tablet Commonly known as:  PACERONE Take 400 mg Two Times Daily for One Week. Then Restart 200 mg Daily. Notes to patient:  Resume at 200mg  once daily   atorvastatin 40 MG tablet Commonly  known as:  LIPITOR Take 1 tablet (40 mg total) by mouth every morning.   carvedilol 6.25 MG tablet Commonly known as:  COREG Take 1 tablet (6.25 mg total) by mouth 2 (two) times daily with a meal.   diphenhydramine-acetaminophen 25-500 MG Tabs tablet Commonly known as:  TYLENOL PM Take 1 tablet by mouth at bedtime.   fenofibrate 145 MG tablet Commonly known as:  TRICOR Take 145 mg by mouth daily.   ferrous sulfate 325 (65 FE) MG tablet Take 325 mg by mouth every morning.   furosemide 40 MG tablet Commonly known as:  LASIX Take 1 tablet (40 mg total) by mouth daily.   lisinopril 20 MG tablet Commonly known as:  PRINIVIL,ZESTRIL Take 1 tablet (20 mg total) by mouth daily.   nitroGLYCERIN 0.4 MG SL tablet Commonly known as:  NITROSTAT Place 1 tablet (0.4 mg total) under the tongue every 5 (five) minutes as needed for chest pain.   pantoprazole 40 MG tablet Commonly known as:  PROTONIX Take 40 mg by mouth daily.   potassium chloride SA 20 MEQ tablet Commonly known as:  K-DUR,KLOR-CON Take 1 tablet (20 mEq total) by mouth daily.   rivaroxaban 20 MG Tabs tablet Commonly known as:  XARELTO Take 1 tablet (20 mg total) by mouth daily with supper.       Disposition: Home Discharge Instructions    Diet - low sodium heart healthy    Complete by:  As directed    Increase activity slowly    Complete by:  As directed      Follow-up Information    [facility] Follow up on 11/25/2016.   Specialty:  Cardiology Why:  8:30AM Contact information: [address] [id number] [place] [phone]       Nayeliz Hipp Meredith Leeds, MD Follow up on 01/27/2017.   Specialty:  Cardiology Why:  10:30AM  Contact information: [address] [address] [phone]           Duration of Discharge Encounter: Greater than 30 minutes including physician time.  SignedTommye Standard,  PA-C 10/25/2016 2:08 PM  I have seen and examined this patient with Tommye Standard.  Agree with above, note added to reflect my findings.  On exam, regular rhythm, no murmurs, lungs clear. Had AF/flutter ablation.  In sinus rhythm overnight.  Had bleeding overnight from right groin site requiring prolonged bed rest and multiple episodes of pressure.  Groin site stable.  Plan for discharge with follow up in clinic.    Shondell Fabel M. Oaklee Sunga MD 10/28/2016 7:06 AM

## 2016-10-24 NOTE — Anesthesia Postprocedure Evaluation (Signed)
Anesthesia Post Note  Patient: Peter [name]  Procedure(s) Performed: Procedure(s) (LRB): Atrial Fibrillation Ablation (N/A)  Patient location during evaluation: Cath Lab Anesthesia Type: General Level of consciousness: awake and alert Pain management: pain level controlled Vital Signs Assessment: post-procedure vital signs reviewed and stable Respiratory status: spontaneous breathing, nonlabored ventilation, respiratory function stable and patient connected to nasal cannula oxygen Cardiovascular status: blood pressure returned to baseline and stable Postop Assessment: no signs of nausea or vomiting Anesthetic complications: no    Last Vitals:  Vitals:   10/24/16 1425 10/24/16 1430  BP: (!) 141/84   Pulse: 74 73  Resp: (!) 23 (!) 24  Temp:      Last Pain:  Vitals:   10/24/16 1203  TempSrc: Temporal                 Nalee Lightle,JAMES TERRILL

## 2016-10-24 NOTE — Progress Notes (Addendum)
Site area: RFV x 2 Site Prior to Removal:  Level 0 Pressure Applied For:30 min Manual:   yes Patient Status During Pull:   Post Pull Site:  Level 0 Post Pull Instructions Given: yes  Post Pull Pulses Present: palpable Dressing Applied: tegaderm  Bedrest begins @ 1330 till 1930 Comments:

## 2016-10-24 NOTE — H&P (Addendum)
Peter [name] is a 70 y.o. male with a history of atrial fibrillation. He presents today for AF/flutter ablation. On exam, irregular rhythm, no murmurs, lungs clear.  Risks and benefits discussed.  Risks include but not limited to bleeding, infection, tamponade, pneumothorax.  He understands the risk and has agreed to the procedure.  Will Peter Bears, MD 10/24/2016 7:12 AM

## 2016-10-24 NOTE — Progress Notes (Signed)
Cards Fellow back at bedside. Verbal orders to leave FemoStop on patient until 0200.

## 2016-10-24 NOTE — Transfer of Care (Signed)
Immediate Anesthesia Transfer of Care Note  Patient: Peter [name]  Procedure(s) Performed: Procedure(s): Atrial Fibrillation Ablation (N/A)  Patient Location: Cath Lab  Anesthesia Type:General  Level of Consciousness: awake, oriented and patient cooperative  Airway & Oxygen Therapy: Patient Spontanous Breathing and Patient connected to nasal cannula oxygen  Post-op Assessment: Report given to RN, Post -op Vital signs reviewed and stable and Patient moving all extremities  Post vital signs: Reviewed and stable  Last Vitals:  Vitals:   10/24/16 1225 10/24/16 1230  BP: (!) 138/97 130/72  Pulse: 68 68  Resp: 19 15  Temp:      Last Pain:  Vitals:   10/24/16 1203  TempSrc: Temporal         Complications: No apparent anesthesia complications

## 2016-10-24 NOTE — Anesthesia Preprocedure Evaluation (Addendum)
Anesthesia Evaluation  Patient identified by MRN, date of birth, ID band Patient awake    Reviewed: Allergy & Precautions, NPO status , Patient's Chart, lab work & pertinent test results  History of Anesthesia Complications Negative for: history of anesthetic complications  Airway Mallampati: II  TM Distance: <3 FB Neck ROM: Full    Dental  (+) Teeth Intact, Dental Advisory Given   Pulmonary neg pulmonary ROS,    breath sounds clear to auscultation       Cardiovascular hypertension, + CAD, +CHF and + DOE  + dysrhythmias Atrial Fibrillation  Rhythm:Irregular Rate:Normal  - Left ventricle: The cavity size was normal. There was mild focal   basal hypertrophy of the septum. Systolic function was normal.   The estimated ejection fraction was in the range of 55% to 60%.   Wall motion was normal; there were no regional wall motion   abnormalities. The study was not technically sufficient to allow   evaluation of LV diastolic dysfunction due to atrial   fibrillation. - Mitral valve: Calcified annulus. - Left atrium: The atrium was mildly dilated. - Pulmonary arteries: PA peak pressure: 31 mm Hg (S).    Neuro/Psych    GI/Hepatic GERD  ,  Endo/Other    Renal/GU Renal disease     Musculoskeletal   Abdominal (+) + obese,   Peds  Hematology  (+) anemia ,   Anesthesia Other Findings   Reproductive/Obstetrics                           Anesthesia Physical Anesthesia Plan  ASA: III  Anesthesia Plan: MAC   Post-op Pain Management:    Induction: Intravenous  Airway Management Planned: Simple Face Mask and Natural Airway  Additional Equipment:   Intra-op Plan:   Post-operative Plan:   Informed Consent: I have reviewed the patients History and Physical, chart, labs and discussed the procedure including the risks, benefits and alternatives for the proposed anesthesia with the patient or  authorized representative who has indicated his/her understanding and acceptance.   Dental advisory given  Plan Discussed with: CRNA  Anesthesia Plan Comments:         Anesthesia Quick Evaluation

## 2016-10-25 ENCOUNTER — Encounter (HOSPITAL_COMMUNITY): Payer: Self-pay | Admitting: Cardiology

## 2016-10-25 DIAGNOSIS — I110 Hypertensive heart disease with heart failure: Secondary | ICD-10-CM | POA: Diagnosis not present

## 2016-10-25 DIAGNOSIS — I481 Persistent atrial fibrillation: Secondary | ICD-10-CM | POA: Diagnosis not present

## 2016-10-25 DIAGNOSIS — I2510 Atherosclerotic heart disease of native coronary artery without angina pectoris: Secondary | ICD-10-CM | POA: Diagnosis not present

## 2016-10-25 DIAGNOSIS — I4892 Unspecified atrial flutter: Secondary | ICD-10-CM | POA: Diagnosis not present

## 2016-10-25 DIAGNOSIS — K219 Gastro-esophageal reflux disease without esophagitis: Secondary | ICD-10-CM | POA: Diagnosis not present

## 2016-10-25 DIAGNOSIS — D649 Anemia, unspecified: Secondary | ICD-10-CM | POA: Diagnosis not present

## 2016-10-25 DIAGNOSIS — N289 Disorder of kidney and ureter, unspecified: Secondary | ICD-10-CM | POA: Diagnosis not present

## 2016-10-25 DIAGNOSIS — E785 Hyperlipidemia, unspecified: Secondary | ICD-10-CM | POA: Diagnosis not present

## 2016-10-25 DIAGNOSIS — Z7901 Long term (current) use of anticoagulants: Secondary | ICD-10-CM | POA: Diagnosis not present

## 2016-10-25 DIAGNOSIS — I509 Heart failure, unspecified: Secondary | ICD-10-CM | POA: Diagnosis not present

## 2016-10-25 DIAGNOSIS — Z79899 Other long term (current) drug therapy: Secondary | ICD-10-CM | POA: Diagnosis not present

## 2016-10-25 DIAGNOSIS — I429 Cardiomyopathy, unspecified: Secondary | ICD-10-CM | POA: Diagnosis not present

## 2016-10-25 LAB — CBC
HCT: 41.9 % (ref 39.0–52.0)
HEMOGLOBIN: 13.4 g/dL (ref 13.0–17.0)
MCH: 31.8 pg (ref 26.0–34.0)
MCHC: 32 g/dL (ref 30.0–36.0)
MCV: 99.5 fL (ref 78.0–100.0)
Platelets: 166 10*3/uL (ref 150–400)
RBC: 4.21 MIL/uL — AB (ref 4.22–5.81)
RDW: 14.1 % (ref 11.5–15.5)
WBC: 9.5 10*3/uL (ref 4.0–10.5)

## 2016-10-25 MED ORDER — FENTANYL CITRATE (PF) 100 MCG/2ML IJ SOLN
25.0000 ug | Freq: Once | INTRAMUSCULAR | Status: AC
  Administered 2016-10-25: 25 ug via INTRAVENOUS

## 2016-10-25 MED ORDER — LIDOCAINE-EPINEPHRINE (PF) 2 %-1:200000 IJ SOLN
20.0000 mL | Freq: Once | INTRAMUSCULAR | Status: AC
  Administered 2016-10-25: 20 mL
  Filled 2016-10-25: qty 20

## 2016-10-25 MED ORDER — FENTANYL CITRATE (PF) 100 MCG/2ML IJ SOLN
INTRAMUSCULAR | Status: AC
  Administered 2016-10-25: 25 ug
  Filled 2016-10-25: qty 2

## 2016-10-25 MED ORDER — FENTANYL CITRATE (PF) 100 MCG/2ML IJ SOLN
INTRAMUSCULAR | Status: AC
  Filled 2016-10-25: qty 2

## 2016-10-25 MED ORDER — OXYCODONE-ACETAMINOPHEN 7.5-325 MG PO TABS
1.0000 | ORAL_TABLET | Freq: Four times a day (QID) | ORAL | Status: DC | PRN
  Administered 2016-10-25: 1 via ORAL
  Filled 2016-10-25: qty 1

## 2016-10-25 MED ORDER — FENTANYL CITRATE (PF) 100 MCG/2ML IJ SOLN: 25.0000 ug | Freq: Once | INTRAMUSCULAR | Status: AC

## 2016-10-25 MED FILL — Dobutamine in Dextrose 5% Inj 4 MG/ML: INTRAVENOUS | Qty: 250 | Status: AC

## 2016-10-25 MED FILL — Adenosine IV Soln 6 MG/2ML: INTRAVENOUS | Qty: 4 | Status: AC

## 2016-10-25 NOTE — Progress Notes (Signed)
Oozing has now stopped. No new ooze since about 07:15 am this morning. Earlyne Iba, BSN, MSN 10/25/2016 @ 11:20 am

## 2016-10-25 NOTE — Progress Notes (Signed)
Pt has walked in the halls twice and no sign of oozing or bleeding from site. Remains soft. D/C information given to and explained to the pt and his wife.  Hal Neer, Rn, BSN, MSN, 10/25/2016 @ 16.15

## 2016-10-25 NOTE — Progress Notes (Signed)
Patient called RN to bedside stating, "I think my condom cath came off when I urinated." RN checked patient and guaze that was placed for extra protection in right groin area was saturated. RN removed FemoStop and held manual pressure due to developing hematoma.   Cards Fellow paged and called to bedside. Manual pressure from Cards Fellow and RN was held for 30 minutes. After pressure was applied, Cards Fellow injected site with Lidocaine and Epi. FemoStop reapplied at 65 mmHg.   Verbal orders to check hemoglobin and hold AM dose of Xarelto was given. Will continue to monitor patient.

## 2016-10-25 NOTE — Discharge Instructions (Signed)
No driving until Tuesday, 10/29/16. No lifting over 5 lbs for 1 week. No vigorous or sexual activity for 1 week. You may return to work on 10/31/16. Keep procedure site clean & dry. If you notice increased pain, swelling, bleeding or pus, call/return!  You may shower, but no soaking baths/hot tubs/pools for 1 week.     You have an appointment set up with the [facility].  Multiple studies have shown that being followed by a dedicated atrial fibrillation clinic in addition to the standard care you receive from your other physicians improves health. We believe that enrollment in the atrial fibrillation clinic will allow [place] to better care for you.   The phone number to the [facility] is [phone]. The clinic is staffed Monday through Friday from 8:30am to 5pm.  Parking Directions: The clinic is located in the Heart and Vascular Building connected to [facility]. 1)From [address] turn on to [address] and go to the 3rd entrance  (Heart and Vascular entrance) on the right. 2)Look to the right for Heart &Vascular Parking Garage. 3)A code for the entrance is required please call the clinic to receive this.   4)Take the elevators to the 1st floor. Registration is in the room with the glass walls at the end of the hallway.  If you have any trouble parking or locating the clinic, please dont hesitate to call [phone].

## 2016-10-25 NOTE — Progress Notes (Signed)
Heaving oozing from RFV site, 2 hrs manual pressure held in lab, Lido with Epi given prior to transfer to 4N.   Continued heavy oozing o/n, no hematoma, no bruit, FemStop applied. Attempts to deflate and remove FemStop led to resumption of heaving oozing, reapplied.   Called to bedside at 5:30 AM for developing hemotoma, pushed out, site injected with another dose of Lido with Epi, FemStop applied at 65 mm Hg.   Check Hct, hold anticoagulation.  Allyson Sabal, MD

## 2016-10-25 NOTE — Progress Notes (Signed)
Attempted to remove FemoStop per Cards Fellow. Site still oozing and soft upon removal . Cards Fellow called and notified. Verbal orders to reapply FemoStop at a low pressure and will reassess in the AM. FemoStop reapplied at a pressure of 30. Will continue to monitor site.

## 2016-10-27 ENCOUNTER — Other Ambulatory Visit: Payer: Self-pay | Admitting: Cardiology

## 2016-10-28 ENCOUNTER — Telehealth: Payer: Self-pay | Admitting: Physician Assistant

## 2016-10-28 NOTE — Telephone Encounter (Signed)
Patient paged the after hour answering service as he felt his energy was zapped out of him today. He has been doing well for the past several days since aflutter and afib ablation on 10/24/2016. He denies any SOB or CP, only fatigue, he also noticed his SBP in 160s. I have instructed him to take additional 10mg  lisinopril tonight and reassess his BP.   Hilbert Corrigan PA Pager: [phone]

## 2016-10-29 ENCOUNTER — Telehealth: Payer: Self-pay | Admitting: Cardiology

## 2016-10-29 NOTE — Telephone Encounter (Signed)
Follow up ° ° ° °Pt verbalized that he is returning call for rn °

## 2016-10-29 NOTE — Telephone Encounter (Signed)
Pt reports not feeling well around 1 pm yesterday.  States he went to lay down and took 3 hr nap. Around 6:30 pm his head felt "tender" which is his sign that his BP is elevated - it was 186/115, HR 68.  This morning it was 213/146, HR 67. States he took Lisinopril 40 mg this morning - states he used to take this dosage for years.  Was decreased to 20 mg at Amiodarone initiation secondary to low BPs.  Advised pt to check BP in several hours and to call if not improving after taking Lisinopril 40mg . I will follow up with pt this afternoon to see if improvement and we will discuss next step.  Pt is agreeable.

## 2016-10-29 NOTE — Telephone Encounter (Signed)
Reports BP 158/90, HR 58. Advised pt to keep monitoring.  We will speak in the morning to see a.m. lood pressure to determine plan of care.  He is agreeable to plan.

## 2016-10-29 NOTE — Telephone Encounter (Signed)
New Message  Pt c/o BP issue: STAT if pt c/o blurred vision, one-sided weakness or slurred speech  1. What are your last 5 BP readings? Last night, 164/116 and just now 200/140  2. Are you having any other symptoms (ex. Dizziness, headache, blurred vision, passed out)? Weak and simply did not feel well  3. What is your BP issue? Pt wondering if he should double up on his medication. Pt voiced he did not feel well.

## 2016-10-30 ENCOUNTER — Encounter: Payer: Self-pay | Admitting: Cardiology

## 2016-10-30 MED ORDER — LISINOPRIL 40 MG PO TABS: 40.0000 mg | ORAL_TABLET | Freq: Every day | ORAL | 3 refills | Status: DC

## 2016-10-30 NOTE — Telephone Encounter (Signed)
New message  Pt is calling to report BP readings as requested  Please call back

## 2016-10-30 NOTE — Telephone Encounter (Signed)
Call Documentation   Peter [name] at 10/30/2016 11:25 AM   Status: Signed    New message  Pt is calling to report BP readings as requested  Please call back

## 2016-10-30 NOTE — Telephone Encounter (Signed)
This encounter was created in error - please disregard.

## 2016-10-30 NOTE — Telephone Encounter (Signed)
Today's BP readings: Started recording BP 1 hr after taking morning medications. (these are resting BPs-he has been laying down all day)  9am  157/95, HR 60 10:30am 139/79, HR 63 2pm  173/109, HR 68 3pm  172/95, HR 63 4:45pm 160/93, HR 58  Pt recently increased Lisinopril to 40 mg (which is dose he was taking before Amiodarone initiation.  They dropped it to 20mg  at hospital d/c) Hx of taking Amlodipine, but it was also d/c around Amiodarone initiation.  Will forward to Dr. [name] for advisement. Pt understands I will call him once reviewed by physician.

## 2016-10-30 NOTE — Procedures (Signed)
Patient Name: Peter [name], Zalewski Date: 10/09/2016 Gender: Male D.O.B: Oct 25, 1946 Age (years): 18 Referring Provider: Will Camnitz Height (inches): 68 Interpreting Physician: [name] MD, ABSM Weight (lbs): 218 RPSGT: Gerhard Perches BMI: 33 MRN: [id number] Neck Size: 17.50  CLINICAL INFORMATION The patient is referred for a split night study with BPAP.  MEDICATIONS amiodarone (PACERONE) 200 MG tablet atorvastatin (LIPITOR) 40 MG tablet carvedilol (COREG) 6.25 MG tablet diphenhydramine-acetaminophen (TYLENOL PM) 25-500 MG TABS tablet fenofibrate (TRICOR) 145 MG tablet ferrous sulfate 325 (65 FE) MG tablet furosemide (LASIX) 40 MG tablet lisinopril (PRINIVIL,ZESTRIL) 10 MG tablet lisinopril (PRINIVIL,ZESTRIL) 20 MG tablet nitroGLYCERIN (NITROSTAT) 0.4 MG SL tablet pantoprazole (PROTONIX) 40 MG tablet potassium chloride SA (K-DUR,KLOR-CON) 20 MEQ tablet rivaroxaban (XARELTO) 20 MG TABS tablet  Medications self-administered by patient taken the night of the study : N/A  SLEEP STUDY TECHNIQUE As per the AASM Manual for the Scoring of Sleep and Associated Events v2.3 (April 2016) with a hypopnea requiring 4% desaturations.  The channels recorded and monitored were frontal, central and occipital EEG, electrooculogram (EOG), submentalis EMG (chin), nasal and oral airflow, thoracic and abdominal wall motion, anterior tibialis EMG, snore microphone, electrocardiogram, and pulse oximetry. Bi-level positive airway pressure (BiPAP) was initiated when the patient met split night criteria and was titrated according to treat sleep-disordered breathing.  RESPIRATORY PARAMETERS Diagnostic Total AHI (/hr): 84.2 RDI (/hr): 84.2 OA Index (/hr): 45.2 CA Index (/hr): 0.0 REM AHI (/hr): N/A NREM AHI (/hr): 84.2 Supine AHI (/hr): 80.5 Non-supine AHI (/hr): 86.90 Min O2 Sat (%): 63.00 Mean O2 (%): 88.90 Time below 88% (min): 53.9    Titration Optimal IPAP Pressure  (cm): 27 Optimal EPAP Pressure (cm): 23 AHI at Optimal Pressure (/hr): 20.2 Min O2 at Optimal Pressure (%): 87.0 Sleep % at Optimal (%): 62 Supine % at Optimal (%): 100      SLEEP ARCHITECTURE The study was initiated at 10:20:23 PM and terminated at 5:05:06 AM. The total recorded time was 404.7 minutes. EEG confirmed total sleep time was 321.5 minutes yielding a sleep efficiency of 79.4%. Sleep onset after lights out was 5.8 minutes with a REM latency of 180.0 minutes. The patient spent 7.93% of the night in stage N1 sleep, 78.69% in stage N2 sleep, 0.00% in stage N3 and 13.37% in REM. Wake after sleep onset (WASO) was 77.4 minutes. The Arousal Index was 54.9/hour.  LEG MOVEMENT DATA The total Periodic Limb Movements of Sleep (PLMS) were 50. The PLMS index was 9.33 .  CARDIAC DATA The 2 lead EKG demonstrated atrial fibrillation. The mean heart rate was 102.97 beats per minute. Other EKG findings include: PVCs.  IMPRESSIONS - Severe obstructive sleep apnea occurred during the diagnostic portion of the study (AHI = 84.2 /hour).  CPAP was titrated to 19 cm water pressure and was switched to BiPAP  at 21/17 and titrated to 27/23.  AHI at 27/23 was still increased at 6.7/h with RDI 13.4/h and oxygen desaturation to 87%. Optimal titration was not achieved.  - No significant central sleep apnea occurred during the diagnostic portion of the study (CAI = 0.0/hour). - Absence of REM sleep on the diagnostic portion of the study. - Severe oxygen desaturation was noted during the diagnostic portion of the study to a nadir of 63.00% during NREM sleep. - The patient snored with Soft snoring volume during the diagnostic portion of the study. - EKG findings: atrial fibrillation , PVCs. - Clinically significant periodic limb movements of sleep did not occur during  the study.  DIAGNOSIS - Obstructive Sleep Apnea (327.23 [G47.33 ICD-10])  RECOMMENDATIONS - Recommend an initial trial of BiPAP Auto with  B-Flex/EPR with an EPAP min of 20, delta of 4 and IPAP max of 30 cm H2O with  heated humidification.  A Large size Resmed Full Face Mask AirFit F20 mask was used for the titration.  - Efforts should be made to optimize nasal and oropharyngeal patency.  - Avoid alcohol, sedatives and other CNS depressants that may worsen sleep apnea and disrupt normal sleep architecture. - Sleep hygiene should be reviewed to assess factors that may improve sleep quality. - Weight management (BMI 33) and regular exercise should be initiated or continued. - Recommend a download be obtained in 30 days and sleep clinic evaluation after 4 weeks of therapy.  [Electronically signed] 10/30/2016 09:59 AM  [name] MD, [facility], [place], American Board of Sleep Medicine  NPI: [id number] [place] PH: [phone]   FX: [phone] [place]

## 2016-10-31 ENCOUNTER — Telehealth: Payer: Self-pay | Admitting: *Deleted

## 2016-10-31 MED ORDER — AMLODIPINE BESYLATE 5 MG PO TABS: 5.0000 mg | ORAL_TABLET | Freq: Every day | ORAL | 1 refills | Status: DC

## 2016-10-31 NOTE — Telephone Encounter (Signed)
Start norvasc 5 mg. Check BP for one week and call back next week if still elevated.

## 2016-10-31 NOTE — Progress Notes (Signed)
Patient notified of sleep study results and recommendations. Need ammended office note from Dr Lennie Odor 10/9 17 documenting symptoms and plan to order sleep study.

## 2016-10-31 NOTE — Telephone Encounter (Signed)
-----   Message from Troy Sine, MD sent at 10/30/2016 10:05 AM EST ----- Mariann Laster please notify pt set up with DME for BiPAP ASAP and f/u sleep clinic

## 2016-10-31 NOTE — Telephone Encounter (Signed)
Patient informed and verbalized understanding of plan. 

## 2016-10-31 NOTE — Telephone Encounter (Signed)
Patient notified of sleep study results and recommendations. He voiced understanding. Given the chance to ask questions. Patient had none.

## 2016-10-31 NOTE — Telephone Encounter (Signed)
Spoke with pt yesterday on BP concerns.  He states he still  has not received any results on his sleep study. Informed pt I would resend request for results.

## 2016-11-01 ENCOUNTER — Other Ambulatory Visit (HOSPITAL_COMMUNITY): Payer: Medicare Other

## 2016-11-08 ENCOUNTER — Emergency Department (HOSPITAL_COMMUNITY)
Admission: EM | Admit: 2016-11-08 | Discharge: 2016-11-08 | Disposition: A | Discharge status: Home / Self Care | Payer: Medicare Other | Source: Home / Self Care | Attending: Emergency Medicine | Admitting: Emergency Medicine

## 2016-11-08 ENCOUNTER — Encounter (HOSPITAL_COMMUNITY): Payer: Self-pay | Admitting: Emergency Medicine

## 2016-11-08 ENCOUNTER — Emergency Department (HOSPITAL_COMMUNITY)
Admission: EM | Admit: 2016-11-08 | Discharge: 2016-11-08 | Disposition: A | Discharge status: Home / Self Care | Payer: Medicare Other | Attending: Emergency Medicine | Admitting: Emergency Medicine

## 2016-11-08 ENCOUNTER — Telehealth: Payer: Self-pay | Admitting: Cardiology

## 2016-11-08 DIAGNOSIS — Z7901 Long term (current) use of anticoagulants: Secondary | ICD-10-CM

## 2016-11-08 DIAGNOSIS — I10 Essential (primary) hypertension: Secondary | ICD-10-CM | POA: Diagnosis not present

## 2016-11-08 DIAGNOSIS — Z79899 Other long term (current) drug therapy: Secondary | ICD-10-CM | POA: Insufficient documentation

## 2016-11-08 DIAGNOSIS — I252 Old myocardial infarction: Secondary | ICD-10-CM | POA: Insufficient documentation

## 2016-11-08 DIAGNOSIS — I130 Hypertensive heart and chronic kidney disease with heart failure and stage 1 through stage 4 chronic kidney disease, or unspecified chronic kidney disease: Secondary | ICD-10-CM | POA: Insufficient documentation

## 2016-11-08 DIAGNOSIS — R040 Epistaxis: Secondary | ICD-10-CM | POA: Insufficient documentation

## 2016-11-08 DIAGNOSIS — N183 Chronic kidney disease, stage 3 (moderate): Secondary | ICD-10-CM

## 2016-11-08 DIAGNOSIS — I2510 Atherosclerotic heart disease of native coronary artery without angina pectoris: Secondary | ICD-10-CM | POA: Insufficient documentation

## 2016-11-08 DIAGNOSIS — Z955 Presence of coronary angioplasty implant and graft: Secondary | ICD-10-CM | POA: Insufficient documentation

## 2016-11-08 DIAGNOSIS — I5042 Chronic combined systolic (congestive) and diastolic (congestive) heart failure: Secondary | ICD-10-CM | POA: Insufficient documentation

## 2016-11-08 MED ORDER — CEPHALEXIN 500 MG PO CAPS: 500.0000 mg | ORAL_CAPSULE | Freq: Four times a day (QID) | ORAL | 0 refills | Status: DC

## 2016-11-08 MED ORDER — OXYMETAZOLINE HCL 0.05 % NA SOLN
1.0000 | Freq: Once | NASAL | Status: AC
  Administered 2016-11-08: 1 via NASAL
  Filled 2016-11-08: qty 15

## 2016-11-08 MED ORDER — TRANEXAMIC ACID 1000 MG/10ML IV SOLN
500.0000 mg | Freq: Once | INTRAVENOUS | Status: AC
  Administered 2016-11-08: 500 mg via TOPICAL
  Filled 2016-11-08: qty 10

## 2016-11-08 MED ORDER — AMLODIPINE BESYLATE 10 MG PO TABS: 10.0000 mg | ORAL_TABLET | Freq: Every day | ORAL | 6 refills | Status: DC

## 2016-11-08 NOTE — Telephone Encounter (Signed)
Pt seeing ENT doc on Tuesday, 11/12/16, for evaluation of nosebleeds. He is currently ok without active bleeding.  Speculation that these are due to dry air, but consult will determine. Pt advised to increase Amlodipine to 10 mg, per Dr. [name].  Pt will stop by office next Tuesday to review BPs after medication increase and perform EKG to evaluate reported low HRs. Patient verbalized understanding and agreeable to plan.

## 2016-11-08 NOTE — ED Notes (Signed)
EDPA at [facility] placing rapid rhino

## 2016-11-08 NOTE — ED Notes (Signed)
Nasal bleeding has stopped since procedure.

## 2016-11-08 NOTE — ED Provider Notes (Signed)
[facility] DEPT Provider Note   CSN: [id number] Arrival date & time: 11/08/16  0604     History   Chief Complaint Chief Complaint  Patient presents with  . Epistaxis    HPI Peter [name] is a 70 y.o. male.Her to the ER within 8 hours. 4. Right-sided epistaxis. Patient is on Xarelto. He had a TXA nebulization earlier with resolution of his epistaxis. However, he returns today with worsening. His blood pressure is elevated. He has not yet taken his daily medications. He denies weakness, lightheadedness.  HPI  Past Medical History:  Diagnosis Date  . At risk for sleep apnea    STOP-BANG= 5       SENT TO PCP 07-12-2015  . CAD (coronary artery disease)    a. s/p PCI with DES x 3 to LAD in 2006 with bifurcation lesion and Plavix indefinitely was recommended.  . Chronic combined systolic and diastolic CHF (congestive heart failure) ([place])    a. Prior EF normal but EF dropped to 30-35% in 07/2016 in setting of AFIB.  . CKD (chronic kidney disease), stage III   . Dyslipidemia   . Dyspnea on exertion   . GERD (gastroesophageal reflux disease)   . History of GI bleed   . History of kidney stones   . History of MI (myocardial infarction)    11-05-2005--  periprocedural MI  (cardiac cath) per dr [name] note  . Hypertension   . Hypertriglyceridemia   . Iron deficiency anemia   . Persistent atrial fibrillation ([place])    a. 07/2016, started on Eliquis -> readmitted later that month for AF RVR/acute HF - initially unsuccessful DCCV following TEE, so loaded with amio with repeat successful DCCV 07/2816.  . Right ureteral stone   . S/P drug eluting coronary stent placement    x3  to LAD  2006    Patient Active Problem List   Diagnosis Date Noted  . AF (atrial fibrillation) ([place]) 10/24/2016  . Obesity 08/09/2016  . Atrial fibrillation with RVR ([place]) 08/03/2016  . New onset a-fib ([place]) 07/16/2016  . Persistent atrial fibrillation ([place]) 07/16/2016  . Acute systolic (congestive)  heart failure   . Ejection fraction   . Iron deficiency anemia due to chronic blood loss 02/04/2012  . Anemia 02/03/2012  . CKD (chronic kidney disease), stage III 02/03/2012  . DOE (dyspnea on exertion) 01/28/2012  . Hypertension   . Coronary artery disease due to lipid rich plaque   . Dyslipidemia   . GERD (gastroesophageal reflux disease)     Past Surgical History:  Procedure Laterality Date  . CARDIOVASCULAR STRESS TEST  01-06-2013  dr [name]   normal perfusion study/  no ischemia or infarct/  normal LV function and wall motion , ef 57%  . CARDIOVERSION N/A 08/05/2016   Procedure: CARDIOVERSION;  Surgeon: Sanda Klein, MD;  Location: [facility];  Service: Cardiovascular;  Laterality: N/A;  . CARDIOVERSION N/A 08/08/2016   Procedure: CARDIOVERSION;  Surgeon: Jerline Pain, MD;  Location: [facility] ENDOSCOPY;  Service: Cardiovascular;  Laterality: N/A;  . CORONARY ANGIOPLASTY  11-13-2005 dr [name]   Successful touch up post-dilatation of the 3 tandem overlying Cypher stents in proximal to mid LAD (based on IVUS 0% stenosis and patent diagonal branch)/  Cutting Balloon Angioplasty of Ramus branch  . CORONARY ANGIOPLASTY WITH STENT PLACEMENT  11-05-2005   dr [name]   PCI and DES x3 to birfurcation lesion LAD and diagonal branch of LAD/   20% dLM,  30-40% RCA,  ef 60%  . CYSTOSCOPY WITH RETROGRADE PYELOGRAM, URETEROSCOPY AND STENT PLACEMENT Right 07/14/2015   Procedure: CYSTOSCOPY WITH RETROGRADE PYELOGRAM, URETEROSCOPY AND STENT PLACEMENT;  Surgeon: Alexis Frock, MD;  Location[facility];  Service: Urology;  Laterality: Right;  . ELECTROPHYSIOLOGIC STUDY N/A 10/24/2016   Procedure: Atrial Fibrillation Ablation;  Surgeon: Will Meredith Leeds, MD;  Location: [facility] CV LAB;  Service: Cardiovascular;  Laterality: N/A;  . EXTRACORPOREAL SHOCK WAVE LITHOTRIPSY  yrs ago  . IR GENERIC HISTORICAL  10/23/2016   IR FLUORO GUIDE CV LINE RIGHT 10/23/2016 Greggory Keen, MD  [facility]  . IR GENERIC HISTORICAL  10/23/2016   IR US GUIDE VASC ACCESS RIGHT 10/23/2016 Greggory Keen, MD [facility]  . LEFT URETEROSCOPIC STONE EXTRACTION  04-10-2006  . STONE EXTRACTION WITH BASKET Right 07/14/2015   Procedure: STONE EXTRACTION WITH BASKET;  Surgeon: Alexis Frock, MD;  Location: [facility];  Service: Urology;  Laterality: Right;  . TEE WITHOUT CARDIOVERSION N/A 08/05/2016   Procedure: TRANSESOPHAGEAL ECHOCARDIOGRAM (TEE);  Surgeon: Sanda Klein, MD;  Location: [facility] ENDOSCOPY;  Service: Cardiovascular;  Laterality: N/A;  . TRANSTHORACIC ECHOCARDIOGRAM  02-04-2012   grade I diastolic dysfunction/  ef 55-60%/  trivial MR and TR/  mild LAE       Home Medications    Prior to Admission medications   Medication Sig Start Date End Date Taking? Authorizing Provider  amiodarone (PACERONE) 200 MG tablet Take 400 mg Two Times Daily for One Week. Then Restart 200 mg Daily. Patient taking differently: Take 200 mg by mouth daily.  10/02/16   Will Meredith Leeds, MD  amLODipine (NORVASC) 5 MG tablet Take 1 tablet (5 mg total) by mouth daily. 10/31/16   Will Meredith Leeds, MD  atorvastatin (LIPITOR) 40 MG tablet Take 1 tablet (40 mg total) by mouth every morning. 02/16/16   Jerline Pain, MD  carvedilol (COREG) 6.25 MG tablet Take 1 tablet (6.25 mg total) by mouth 2 (two) times daily with a meal. 08/15/16   Eileen Stanford, PA-C  diphenhydramine-acetaminophen (TYLENOL PM) 25-500 MG TABS tablet Take 1 tablet by mouth at bedtime.    Historical Provider, MD  fenofibrate (TRICOR) 145 MG tablet Take 145 mg by mouth daily.    Historical Provider, MD  ferrous sulfate 325 (65 FE) MG tablet Take 325 mg by mouth every morning.     Historical Provider, MD  furosemide (LASIX) 40 MG tablet Take 1 tablet (40 mg total) by mouth daily. 08/09/16   Dayna N Dunn, PA-C  lisinopril (PRINIVIL,ZESTRIL) 40 MG tablet Take 1 tablet (40 mg total) by mouth daily. 10/30/16 01/28/17  Will  Meredith Leeds, MD  nitroGLYCERIN (NITROSTAT) 0.4 MG SL tablet Place 1 tablet (0.4 mg total) under the tongue every 5 (five) minutes as needed for chest pain. 02/16/16   Jerline Pain, MD  pantoprazole (PROTONIX) 40 MG tablet Take 40 mg by mouth daily.     Historical Provider, MD  polyvinyl alcohol (LIQUIFILM TEARS) 1.4 % ophthalmic solution Place 1 drop into both eyes daily as needed for dry eyes.    Historical Provider, MD  potassium chloride SA (K-DUR,KLOR-CON) 20 MEQ tablet Take 1 tablet (20 mEq total) by mouth daily. 08/10/16   Dayna N Dunn, PA-C  rivaroxaban (XARELTO) 20 MG TABS tablet Take 1 tablet (20 mg total) by mouth daily with supper. 08/09/16   Dayna N Dunn, PA-C    Family History Family History  Problem Relation Age of Onset  . Heart  attack Father 60    Social History Social History  Substance Use Topics  . Smoking status: Never Smoker  . Smokeless tobacco: Never Used  . Alcohol use No     Allergies   Patient has no known allergies.   Review of Systems Review of Systems  Ten systems reviewed and are negative for acute change, except as noted in the HPI. ] Physical Exam Updated Vital Signs BP (!) 160/106   Pulse 77   Temp 97.8 F (36.6 C) (Oral)   Resp 20   Ht 5\' 8"  (1.727 m)   Wt 98 kg   SpO2 98%   BMI 32.84 kg/m   Physical Exam  Constitutional: He is oriented to person, place, and time. He appears well-developed and well-nourished. No distress.  HENT:  Head: Normocephalic and atraumatic.  Nose: Epistaxis is observed.    Eyes: Conjunctivae and EOM are normal. Pupils are equal, round, and reactive to light. No scleral icterus.  Neck: Normal range of motion. Neck supple.  Cardiovascular: Normal rate, regular rhythm and normal heart sounds.   Pulmonary/Chest: Effort normal and breath sounds normal. No respiratory distress.  Abdominal: Soft. There is no tenderness.  Musculoskeletal: He exhibits no edema.  Neurological: He is alert and oriented to person,  place, and time.  Skin: Skin is warm and dry. He is not diaphoretic.  Psychiatric: His behavior is normal.  Nursing note and vitals reviewed.    ED Treatments / Results  Labs (all labs ordered are listed, but only abnormal results are displayed) Labs Reviewed - No data to display  EKG  EKG Interpretation None       Radiology No results found.  Procedures .Epistaxis Management Date/Time: 11/08/2016 6:54 AM Performed by: Margarita Mail Authorized by: Margarita Mail   Consent:    Consent obtained:  Verbal   Consent given by:  Patient   Risks discussed:  Bleeding, nasal injury and pain Procedure details:    Treatment site:  Unable to specify (RIGHT SIDE)   Treatment method:  Nasal balloon   Treatment complexity:  Limited   Treatment episode: recurring   Post-procedure details:    Assessment:  Bleeding stopped   Patient tolerance of procedure:  Tolerated with difficulty   (including critical care time)  Medications Ordered in ED Medications - No data to display   Initial Impression / Assessment and Plan / ED Course  I have reviewed the triage vital signs and the nursing notes.  Pertinent labs & imaging results that were available during my care of the patient were reviewed by me and considered in my medical decision making (see chart for details).  Clinical Course    Patient with right-sided epistaxis. The rapid Rhino nasal balloon was placed with hemostasis. Patient given Keflex, return precautions and follow-up with ENT. Encouraged patient to call today to set up his appointment. He appears safe for discharge at this time  Final Clinical Impressions(s) / ED Diagnoses   Final diagnoses:  Right-sided epistaxis  Hypertension, unspecified type    New Prescriptions New Prescriptions   No medications on file     Margarita Mail, PA-C 11/08/16 [id number]    Varney Biles, MD 11/09/16 [phone]

## 2016-11-08 NOTE — Discharge Instructions (Signed)
You had a nose bleed which has stopped. Nose bleeds can recur however, so please read the instructions below.  If the bleeding recurs, please apply direct pressure to the nose for 5 minutes straight, breathing from the mouth and spitting out any blood. After 5 minutes of holding pressure, if the bleeding continues, do the same thing again for 5 more minutes. If after 2nd round of holding pressure the bleeding persists - clear the nose and apply afrin spray. After applying afrin spray to the nares, hold pressure again for 5 minutes. If the bleeding continues despite these measures, then come to the ER while holding direct pressure to the nares.  For now - please do not blow your nose, or put fingers in your nose to clear up any clots.

## 2016-11-08 NOTE — ED Triage Notes (Signed)
Pt presents with nosebleed from the right nare  Pt states it started about 2 hours ago and stopped for about 30 minutes then started again  Pt states he takes xarelto

## 2016-11-08 NOTE — ED Provider Notes (Addendum)
[facility] DEPT Provider Note   CSN: [id number] Arrival date & time: 11/08/16  0007  By signing my name below, I, Peter [name], attest that this documentation has been prepared under the direction and in the presence of Varney Biles, MD. Electronically Signed: Judithe [name], ER Scribe. 06/22/2016. 1:01 AM.  History   Chief Complaint Chief Complaint  Patient presents with  . Epistaxis   The history is provided by the patient. No language interpreter was used.   HPI Comments: Peter [name] is a 70 y.o. male who presents to the Emergency Department complaining of epistaxis since this afternoon. He was able to stop the bleeding at two points, but each tine the bleeding resumed which is why he reported to the ED. He is on Xarelto. He had one other event of epistaxis several months ago and was seen in the ED and had to have packing. He has no PMHx of bleeding disorders or lukemia. He had a heart ablation two weeks ago, which was complicated with groin bleeding.   Past Medical History:  Diagnosis Date  . At risk for sleep apnea    STOP-BANG= 5       SENT TO PCP 07-12-2015  . CAD (coronary artery disease)    a. s/p PCI with DES x 3 to LAD in 2006 with bifurcation lesion and Plavix indefinitely was recommended.  . Chronic combined systolic and diastolic CHF (congestive heart failure) ([place])    a. Prior EF normal but EF dropped to 30-35% in 07/2016 in setting of AFIB.  . CKD (chronic kidney disease), stage III   . Dyslipidemia   . Dyspnea on exertion   . GERD (gastroesophageal reflux disease)   . History of GI bleed   . History of kidney stones   . History of MI (myocardial infarction)    11-05-2005--  periprocedural MI  (cardiac cath) per dr [name] note  . Hypertension   . Hypertriglyceridemia   . Iron deficiency anemia   . Persistent atrial fibrillation ([place])    a. 07/2016, started on Eliquis -> readmitted later that month for AF RVR/acute HF - initially unsuccessful  DCCV following TEE, so loaded with amio with repeat successful DCCV 07/2816.  . Right ureteral stone   . S/P drug eluting coronary stent placement    x3  to LAD  2006    Patient Active Problem List   Diagnosis Date Noted  . AF (atrial fibrillation) ([place]) 10/24/2016  . Obesity 08/09/2016  . Atrial fibrillation with RVR ([place]) 08/03/2016  . New onset a-fib ([place]) 07/16/2016  . Persistent atrial fibrillation ([place]) 07/16/2016  . Acute systolic (congestive) heart failure   . Ejection fraction   . Iron deficiency anemia due to chronic blood loss 02/04/2012  . Anemia 02/03/2012  . CKD (chronic kidney disease), stage III 02/03/2012  . DOE (dyspnea on exertion) 01/28/2012  . Hypertension   . Coronary artery disease due to lipid rich plaque   . Dyslipidemia   . GERD (gastroesophageal reflux disease)     Past Surgical History:  Procedure Laterality Date  . CARDIOVASCULAR STRESS TEST  01-06-2013  dr [name]   normal perfusion study/  no ischemia or infarct/  normal LV function and wall motion , ef 57%  . CARDIOVERSION N/A 08/05/2016   Procedure: CARDIOVERSION;  Surgeon: Sanda Klein, MD;  Location: [place] ENDOSCOPY;  Service: Cardiovascular;  Laterality: N/A;  . CARDIOVERSION N/A 08/08/2016   Procedure: CARDIOVERSION;  Surgeon: Jerline Pain, MD;  Location: [facility]  ENDOSCOPY;  Service: Cardiovascular;  Laterality: N/A;  . CORONARY ANGIOPLASTY  11-13-2005 dr [name]   Successful touch up post-dilatation of the 3 tandem overlying Cypher stents in proximal to mid LAD (based on IVUS 0% stenosis and patent diagonal branch)/  Cutting Balloon Angioplasty of Ramus branch  . CORONARY ANGIOPLASTY WITH STENT PLACEMENT  11-05-2005   dr [name]   PCI and DES x3 to birfurcation lesion LAD and diagonal branch of LAD/   20% dLM,  30-40% RCA, ef 60%  . CYSTOSCOPY WITH RETROGRADE PYELOGRAM, URETEROSCOPY AND STENT PLACEMENT Right 07/14/2015   Procedure: CYSTOSCOPY WITH RETROGRADE PYELOGRAM, URETEROSCOPY AND STENT  PLACEMENT;  Surgeon: Alexis Frock, MD;  Location: [facility];  Service: Urology;  Laterality: Right;  . ELECTROPHYSIOLOGIC STUDY N/A 10/24/2016   Procedure: Atrial Fibrillation Ablation;  Surgeon: Will Meredith Leeds, MD;  Location: [place] CV LAB;  Service: Cardiovascular;  Laterality: N/A;  . EXTRACORPOREAL SHOCK WAVE LITHOTRIPSY  yrs ago  . IR GENERIC HISTORICAL  10/23/2016   IR FLUORO GUIDE CV LINE RIGHT 10/23/2016 Greggory Keen, MD [facility]  . IR GENERIC HISTORICAL  10/23/2016   IR US GUIDE VASC ACCESS RIGHT 10/23/2016 Greggory Keen, MD [facility]  . LEFT URETEROSCOPIC STONE EXTRACTION  04-10-2006  . STONE EXTRACTION WITH BASKET Right 07/14/2015   Procedure: STONE EXTRACTION WITH BASKET;  Surgeon: Alexis Frock, MD;  Location: [facility];  Service: Urology;  Laterality: Right;  . TEE WITHOUT CARDIOVERSION N/A 08/05/2016   Procedure: TRANSESOPHAGEAL ECHOCARDIOGRAM (TEE);  Surgeon: Sanda Klein, MD;  Location: [facility] ENDOSCOPY;  Service: Cardiovascular;  Laterality: N/A;  . TRANSTHORACIC ECHOCARDIOGRAM  02-04-2012   grade I diastolic dysfunction/  ef 55-60%/  trivial MR and TR/  mild LAE       Home Medications    Prior to Admission medications   Medication Sig Start Date End Date Taking? Authorizing Provider  amiodarone (PACERONE) 200 MG tablet Take 400 mg Two Times Daily for One Week. Then Restart 200 mg Daily. Patient taking differently: Take 200 mg by mouth daily.  10/02/16  Yes Will Meredith Leeds, MD  amLODipine (NORVASC) 5 MG tablet Take 1 tablet (5 mg total) by mouth daily. 10/31/16  Yes Will Meredith Leeds, MD  atorvastatin (LIPITOR) 40 MG tablet Take 1 tablet (40 mg total) by mouth every morning. 02/16/16  Yes Jerline Pain, MD  carvedilol (COREG) 6.25 MG tablet Take 1 tablet (6.25 mg total) by mouth 2 (two) times daily with a meal. 08/15/16  Yes Eileen Stanford, PA-C  diphenhydramine-acetaminophen (TYLENOL PM) 25-500 MG TABS tablet  Take 1 tablet by mouth at bedtime.   Yes Historical Provider, MD  fenofibrate (TRICOR) 145 MG tablet Take 145 mg by mouth daily.   Yes Historical Provider, MD  ferrous sulfate 325 (65 FE) MG tablet Take 325 mg by mouth every morning.    Yes Historical Provider, MD  furosemide (LASIX) 40 MG tablet Take 1 tablet (40 mg total) by mouth daily. 08/09/16  Yes Dayna N Dunn, PA-C  lisinopril (PRINIVIL,ZESTRIL) 40 MG tablet Take 1 tablet (40 mg total) by mouth daily. 10/30/16 01/28/17 Yes Will Meredith Leeds, MD  nitroGLYCERIN (NITROSTAT) 0.4 MG SL tablet Place 1 tablet (0.4 mg total) under the tongue every 5 (five) minutes as needed for chest pain. 02/16/16  Yes Jerline Pain, MD  pantoprazole (PROTONIX) 40 MG tablet Take 40 mg by mouth daily.    Yes Historical Provider, MD  polyvinyl alcohol (LIQUIFILM TEARS) 1.4 %  ophthalmic solution Place 1 drop into both eyes daily as needed for dry eyes.   Yes Historical Provider, MD  potassium chloride SA (K-DUR,KLOR-CON) 20 MEQ tablet Take 1 tablet (20 mEq total) by mouth daily. 08/10/16  Yes Dayna N Dunn, PA-C  rivaroxaban (XARELTO) 20 MG TABS tablet Take 1 tablet (20 mg total) by mouth daily with supper. 08/09/16  Yes Dayna N Dunn, PA-C    Family History Family History  Problem Relation Age of Onset  . Heart attack Father 28    Social History Social History  Substance Use Topics  . Smoking status: Never Smoker  . Smokeless tobacco: Never Used  . Alcohol use No     Allergies   Patient has no known allergies.   Review of Systems Review of Systems  Constitutional: Negative for chills and fever.  HENT: Positive for nosebleeds.   Respiratory: Negative for shortness of breath.   Cardiovascular: Negative for chest pain.  Neurological: Negative for dizziness, weakness, light-headedness and headaches.    Physical Exam Updated Vital Signs BP 140/75 (BP Location: Left Arm)   Pulse 67   Temp 97.8 F (36.6 C) (Oral)   Resp 20   SpO2 95%   Physical Exam   Constitutional: He is oriented to person, place, and time. He appears well-developed and well-nourished. No distress.  HENT:  Head: Normocephalic and atraumatic.  Left nare with bright red blood and a clot. No evidence of active bleeding.  Eyes: Pupils are equal, round, and reactive to light.  Neck: Neck supple.  Cardiovascular: Normal rate.   Pulmonary/Chest: Effort normal. No respiratory distress.  Musculoskeletal: Normal range of motion.  Neurological: He is alert and oriented to person, place, and time. Coordination normal.  Skin: Skin is warm and dry. He is not diaphoretic.  Psychiatric: He has a normal mood and affect. His behavior is normal.  Nursing note and vitals reviewed.   ED Treatments / Results  DIAGNOSTIC STUDIES: Oxygen Saturation is 98% on RA, normal by my interpretation.    COORDINATION OF CARE: 12:58 AM Discussed treatment plan with pt at bedside and pt agreed to plan.  Labs (all labs ordered are listed, but only abnormal results are displayed) Labs Reviewed - No data to display  EKG  EKG Interpretation None       Radiology No results found.  Procedures Procedures (including critical care time)  Medications Ordered in ED Medications  tranexamic acid (CYKLOKAPRON) injection 500 mg (500 mg Topical Given 11/08/16 0145)  oxymetazoline (AFRIN) 0.05 % nasal spray 1 spray (1 spray Each Nare Given 11/08/16 0145)     Initial Impression / Assessment and Plan / ED Course  I have reviewed the triage vital signs and the nursing notes.  Pertinent labs & imaging results that were available during my care of the patient were reviewed by me and considered in my medical decision making (see chart for details).  Clinical Course as of Nov 08 313  Fri Nov 08, 2016  [id number] Still no bleeding. Will d/c. Strict ER return precautions have been discussed, and patient is agreeing with the plan and is comfortable with the workup done and the recommendations from the  ER.   [AN]    Clinical Course User Index [AN] Varney Biles, MD   I personally performed the services described in this documentation, which was scribed in my presence. The recorded information has been reviewed and is accurate.  Pt comes in with cc of epistaxis. Pt is on xarelto. Recurrent epistaxis -  this is 2nd episode in the last week, the former required packing.  I applied afrin and TXA - the bleeding has stopped. Will observe. If pt rebleeds, we will pack.   Final Clinical Impressions(s) / ED Diagnoses   Final diagnoses:  Epistaxis    New Prescriptions New Prescriptions   No medications on file          Varney Biles, MD 11/08/16 [place], MD 11/08/16 [place], MD 11/08/16 [id number]

## 2016-11-08 NOTE — ED Notes (Signed)
Patient is still experiencing some nasal bleeding. Applying pressure with a cold towel. Dr. Is aware

## 2016-11-08 NOTE — Telephone Encounter (Signed)
New message     Pt went to ER last night because of nosebleeds.  ER stated bp was 164/105.  He was told to call our office for further advice because pt is on xarelto.  Bp now is 148/67 and HR 40. Pt took bp again while I waited and it was 173/72 HR 40.  He states that his HR is never that low.  Pt states his nose is packed and no further bleeding; however, need advice on medication and bp issues.

## 2016-11-08 NOTE — Discharge Instructions (Signed)
You can have your nasal packing removed in 72 hours (Monday). Please call today to set up your appointment.  Return to the ER if you begin to bleed around the packing. (If you have to return - please go to [facility]- that is the hospital where our ENT providers take consult). Please take all of your medications as directed.    Nosebleeds commonly recur.  If your nose starts bleeding again and doesn't stop after 10-15 minutes of constant pressure squeezing the soft part of your nose, then re-apply the direct pressure and return to the ED. Return also immediately to the nearest ED if you develop chest pain, shortness of breath, dizziness or fainting, severe bleeding, or other concerns.  Nasal Hygiene The nose has many positive effects on the air you breathe in that you may not be aware of. - Temperature regulation - Filtration and removal of particulate matter - Humidification - Defense against infections There are several things you can do to help keep your nose healthy. Foremost is nasal hygiene. This will help with your nose's natural function and keep it moist and healthy. Techniques to accomplish this are outlined below. These will help with nasal dryness, nasal crusting, and nose bleeds. They also assist with clearing thick mucus that cause you to blow your nose frequently and may be associated with thick postnasal drip.  1. Use nasal saline daily. You can buy small bottles of this over-the-counter at the drug store or grocery store. Some brand names are: [place], [place] [employer] [place], [place]. Apply 2-3 sprays each nostril several times a day. If your nose feels dry or have had recent nasal surgery, try to use it every couple of hours. There is no medicine in it so it can be used as often as you like. Do NOT use the sprays containing decongestants. The appropriate way to apply nasal sprays: Place the nozzle just inside your nostril and point it towards the corner of your eye. Often, it is helpful to  use the right-hand to spray into the left nasal cavity and use the left-hand to spray into the right side.  2. Use nasal saline irrigations and flushing.SinuCleanse, Simply Saline, [place]. Use this 1-2 times a day. We can also provide you with a recipe to use at home. Just ask [place]. To prevent reintroducing bacteria back into your nose, please keep your irrigation equipment clean and dry between uses. Throw away and replace reusable irrigation equipment every 3 weeks.   3. Use Vaseline petroleum jelly or Aquaphor. You can apply this gently to each nostril 2-3 times a day to promote moisturization for your nose. You may also use triple antibiotic ointment such as Neosporin or Bacitracin. These can all be bought over-the-counter.  4. Consider other nasal emollients. A few preparations are available over-thecounter. Ponaris, Nose Better, Pretz. Ask your pharmacist what is available. Also, some nasal saline sprays have additives such as aloe and these are helpful.  5. Consider using a humidifier at home. If your nose feels dry and/or you have frequent nose bleeds, you can buy a humidifier for your home. Be cautious in using these if you have mold allergies.  6. Avoid excessive manual manipulation of your nose and nostrils. Frequent rubbing of your nostrils and the passing of tissues or fingers in your nostrils may aggravate nasal irritation from dryness and nose bleeds.

## 2016-11-08 NOTE — ED Triage Notes (Signed)
Pt states he was seen earlier for same and when he got home it started his nose started bleeding again  Pt states he applied pressure for 5 minutes, used the afrin, and applied pressure again but cannot get it to quit bleeding

## 2016-11-08 NOTE — ED Notes (Addendum)
D/c'd this am at 0319 (~ 3 hrs ago) after nose stopped bleeding s/p afrin and TXA applied. Returns for nosebleed re-started, R nare. Also generally weak. (denies: sob, dizziness, pain, HA, nv or other sx). Alert, NAD, calm, steady gait. Wife at [facility]. BP noted to be high, 3 BP meds "not due for a few more hours".

## 2016-11-08 NOTE — ED Notes (Signed)
Patient states the nosebleeds have been going on off and on for the past 2 hours. He was able to get it to stop several times but the last 45 min he has been unable to get it to stop and it has been bleeding continuously. He is on Xarelto. Denies any headaches, dizziness, visual changes, or light headedness at the moment.

## 2016-11-08 NOTE — ED Notes (Signed)
Dr. Kathrynn Humble @ bedside performing nasal packing procedure.

## 2016-11-08 NOTE — ED Notes (Signed)
Patient denies pain and is resting comfortably.  

## 2016-11-08 NOTE — ED Notes (Signed)
Family at bedside. 

## 2016-11-12 DIAGNOSIS — R040 Epistaxis: Secondary | ICD-10-CM | POA: Insufficient documentation

## 2016-11-12 DIAGNOSIS — Z7901 Long term (current) use of anticoagulants: Secondary | ICD-10-CM | POA: Diagnosis not present

## 2016-11-12 NOTE — Telephone Encounter (Signed)
Pt comes by today as agreed.   EKG showing NSR, HR 66.  Pt reports HR avg in the 60s. Reports BP as normal at home, but low at doctor today, 86/42.  Rechecked while pt here and BP reading was 80/46.  Pt complains of weakness, fatigue, dizziness.  Reports at the current time he is feeling weak, but denies dizziness. Reviewed with Camnitz - order to decrease Amlodipine to 5mg , check home BP cuff to ensure accuracy (home cuff is reporting normal to high BPs) and nurse will follow up with him next week. Pt understands to call if BP continue to remain low on decrease dosage. Patient verbalized understanding and agreeable to plan.

## 2016-11-19 ENCOUNTER — Other Ambulatory Visit: Payer: Self-pay | Admitting: Physician Assistant

## 2016-11-19 NOTE — Telephone Encounter (Signed)
Called to follow up.  Pt tells me that he is ok, "about the same".  States he has had issues since September.  Just feels weak -- but he does state/recognize he may be deconditioned being that he has been "somewhat stationary" for months. Reports BPs: 1/6 - lunch 108/67 1/7 - 6 pm 145/82 1/8 - 4:30pm 129/83 1/9 - 10:30am 101/73          3:00pm 134/80 (takes morning meds at 8:30am and lunch meds b/t 11am-1pm) He has not had cuff checked for accuracy yet.  He is going to bring to the office on Friday, while in the office for echo, and have someone check his personal cuff. He will follow up w/ Roderic Palau, NP in the AFib clinic next week post PVI RFA. He will call office if he feels he needs to be seen sooner.

## 2016-11-20 NOTE — Telephone Encounter (Signed)
Rx refill sent to pharmacy. 

## 2016-11-22 ENCOUNTER — Other Ambulatory Visit: Payer: Self-pay

## 2016-11-22 ENCOUNTER — Ambulatory Visit (HOSPITAL_COMMUNITY): Payer: Medicare Other | Attending: Cardiology

## 2016-11-22 DIAGNOSIS — I5022 Chronic systolic (congestive) heart failure: Secondary | ICD-10-CM | POA: Diagnosis not present

## 2016-11-22 DIAGNOSIS — I348 Other nonrheumatic mitral valve disorders: Secondary | ICD-10-CM | POA: Insufficient documentation

## 2016-11-25 ENCOUNTER — Ambulatory Visit (HOSPITAL_COMMUNITY)
Admission: RE | Admit: 2016-11-25 | Discharge: 2016-11-25 | Disposition: A | Discharge status: Home / Self Care | Payer: Medicare Other | Source: Ambulatory Visit | Attending: Nurse Practitioner | Admitting: Nurse Practitioner

## 2016-11-25 ENCOUNTER — Encounter (HOSPITAL_COMMUNITY): Payer: Self-pay | Admitting: Nurse Practitioner

## 2016-11-25 VITALS — BP 122/70 | HR 82 | Ht 68.0 in | Wt 216.6 lb

## 2016-11-25 DIAGNOSIS — E781 Pure hyperglyceridemia: Secondary | ICD-10-CM | POA: Diagnosis not present

## 2016-11-25 DIAGNOSIS — N183 Chronic kidney disease, stage 3 (moderate): Secondary | ICD-10-CM | POA: Insufficient documentation

## 2016-11-25 DIAGNOSIS — I130 Hypertensive heart and chronic kidney disease with heart failure and stage 1 through stage 4 chronic kidney disease, or unspecified chronic kidney disease: Secondary | ICD-10-CM | POA: Insufficient documentation

## 2016-11-25 DIAGNOSIS — I252 Old myocardial infarction: Secondary | ICD-10-CM | POA: Insufficient documentation

## 2016-11-25 DIAGNOSIS — I5042 Chronic combined systolic (congestive) and diastolic (congestive) heart failure: Secondary | ICD-10-CM | POA: Insufficient documentation

## 2016-11-25 DIAGNOSIS — K219 Gastro-esophageal reflux disease without esophagitis: Secondary | ICD-10-CM | POA: Insufficient documentation

## 2016-11-25 DIAGNOSIS — I481 Persistent atrial fibrillation: Secondary | ICD-10-CM | POA: Diagnosis not present

## 2016-11-25 DIAGNOSIS — I4819 Other persistent atrial fibrillation: Secondary | ICD-10-CM

## 2016-11-25 DIAGNOSIS — E785 Hyperlipidemia, unspecified: Secondary | ICD-10-CM | POA: Diagnosis not present

## 2016-11-25 DIAGNOSIS — D509 Iron deficiency anemia, unspecified: Secondary | ICD-10-CM | POA: Diagnosis not present

## 2016-11-25 DIAGNOSIS — I4891 Unspecified atrial fibrillation: Secondary | ICD-10-CM | POA: Diagnosis present

## 2016-11-25 DIAGNOSIS — Z955 Presence of coronary angioplasty implant and graft: Secondary | ICD-10-CM | POA: Diagnosis not present

## 2016-11-25 DIAGNOSIS — Z7901 Long term (current) use of anticoagulants: Secondary | ICD-10-CM | POA: Insufficient documentation

## 2016-11-25 DIAGNOSIS — I2510 Atherosclerotic heart disease of native coronary artery without angina pectoris: Secondary | ICD-10-CM | POA: Insufficient documentation

## 2016-11-25 DIAGNOSIS — Z79899 Other long term (current) drug therapy: Secondary | ICD-10-CM | POA: Diagnosis not present

## 2016-11-25 NOTE — Progress Notes (Signed)
Primary Care Physician: Peter Amel, MD Referring Physician:Dr. [name] is a 71 y.o. male with a h/o afib ablation in the afib clinic for one month f/u. He has not noticed any afib. He had some groin oozing right after the procedure that did clear before he was discharged.Marland Kitchen He then had a nose bleed that was treated in the ER and has not returned. He continues on blood thinner without missed doses.  Today, he denies symptoms of palpitations, chest [name], shortness of breath, orthopnea, PND, lower extremity edema, dizziness, presyncope, syncope, or neurologic sequela. The patient is tolerating medications without difficulties and is otherwise without complaint today.   Past Medical History:  Diagnosis Date  . At risk for sleep apnea    STOP-BANG= 5       SENT TO PCP 07-12-2015  . CAD (coronary artery disease)    a. s/p PCI with DES x 3 to LAD in 2006 with bifurcation lesion and Plavix indefinitely was recommended.  . Chronic combined systolic and diastolic CHF (congestive heart failure) ([place])    a. Prior EF normal but EF dropped to 30-35% in 07/2016 in setting of AFIB.  . CKD (chronic kidney disease), stage III   . Dyslipidemia   . Dyspnea on exertion   . GERD (gastroesophageal reflux disease)   . History of GI bleed   . History of kidney stones   . History of MI (myocardial infarction)    11-05-2005--  periprocedural MI  (cardiac cath) per dr Peter [name] note  . Hypertension   . Hypertriglyceridemia   . Iron deficiency anemia   . Persistent atrial fibrillation ([place])    a. 07/2016, started on Eliquis -> readmitted later that month for AF RVR/acute HF - initially unsuccessful DCCV following TEE, so loaded with amio with repeat successful DCCV 07/2816.  . Right ureteral stone   . S/P drug eluting coronary stent placement    x3  to LAD  2006   Past Surgical History:  Procedure Laterality Date  . CARDIOVASCULAR STRESS TEST  01-06-2013  dr Peter [name]   normal perfusion study/   no ischemia or infarct/  normal LV function and wall motion , ef 57%  . CARDIOVERSION N/A 08/05/2016   Procedure: CARDIOVERSION;  Surgeon: Peter Klein, MD;  Location: [facility];  Service: Cardiovascular;  Laterality: N/A;  . CARDIOVERSION N/A 08/08/2016   Procedure: CARDIOVERSION;  Surgeon: Peter Pain, MD;  Location: [facility] ENDOSCOPY;  Service: Cardiovascular;  Laterality: N/A;  . CORONARY ANGIOPLASTY  11-13-2005 dr Peter [name]   Successful touch up post-dilatation of the 3 tandem overlying Cypher stents in proximal to mid LAD (based on IVUS 0% stenosis and patent diagonal branch)/  Cutting Balloon Angioplasty of Ramus branch  . CORONARY ANGIOPLASTY WITH STENT PLACEMENT  11-05-2005   dr Peter [name]   PCI and DES x3 to birfurcation lesion LAD and diagonal branch of LAD/   20% dLM,  30-40% RCA, ef 60%  . CYSTOSCOPY WITH RETROGRADE PYELOGRAM, URETEROSCOPY AND STENT PLACEMENT Right 07/14/2015   Procedure: CYSTOSCOPY WITH RETROGRADE PYELOGRAM, URETEROSCOPY AND STENT PLACEMENT;  Surgeon: Peter Frock, MD;  Location: [facility];  Service: Urology;  Laterality: Right;  . ELECTROPHYSIOLOGIC STUDY N/A 10/24/2016   Procedure: Atrial Fibrillation Ablation;  Surgeon: Peter Meredith Leeds, MD;  Location: [facility] CV LAB;  Service: Cardiovascular;  Laterality: N/A;  . EXTRACORPOREAL SHOCK WAVE LITHOTRIPSY  yrs ago  . IR GENERIC HISTORICAL  10/23/2016   IR FLUORO GUIDE CV LINE  RIGHT 10/23/2016 Peter Keen, MD [facility]  . IR GENERIC HISTORICAL  10/23/2016   IR US GUIDE VASC ACCESS RIGHT 10/23/2016 Peter Keen, MD [facility]  . LEFT URETEROSCOPIC STONE EXTRACTION  04-10-2006  . STONE EXTRACTION WITH BASKET Right 07/14/2015   Procedure: STONE EXTRACTION WITH BASKET;  Surgeon: Peter Frock, MD;  Location: [facility];  Service: Urology;  Laterality: Right;  . TEE WITHOUT CARDIOVERSION N/A 08/05/2016   Procedure: TRANSESOPHAGEAL ECHOCARDIOGRAM (TEE);  Surgeon: Peter Klein, MD;  Location: [facility] ENDOSCOPY;  Service: Cardiovascular;  Laterality: N/A;  . TRANSTHORACIC ECHOCARDIOGRAM  02-04-2012   grade I diastolic dysfunction/  ef 55-60%/  trivial MR and TR/  mild LAE    Current Outpatient Prescriptions  Medication Sig Dispense Refill  . amiodarone (PACERONE) 200 MG tablet Take 200 mg by mouth daily.    Marland Kitchen amLODipine (NORVASC) 5 MG tablet Take 5 mg by mouth daily.    Marland Kitchen atorvastatin (LIPITOR) 40 MG tablet Take 1 tablet (40 mg total) by mouth every morning. 90 tablet 3  . carvedilol (COREG) 6.25 MG tablet TAKE 1 TABLET(6.25 MG) BY MOUTH TWICE DAILY WITH A MEAL 60 tablet 2  . diphenhydramine-acetaminophen (TYLENOL PM) 25-500 MG TABS tablet Take 1 tablet by mouth at bedtime.    . fenofibrate (TRICOR) 145 MG tablet Take 145 mg by mouth daily.    . ferrous sulfate 325 (65 FE) MG tablet Take 325 mg by mouth every morning.     . furosemide (LASIX) 40 MG tablet Take 1 tablet (40 mg total) by mouth daily. 30 tablet 3  . lisinopril (PRINIVIL,ZESTRIL) 40 MG tablet Take 1 tablet (40 mg total) by mouth daily. 90 tablet 3  . nitroGLYCERIN (NITROSTAT) 0.4 MG SL tablet Place 1 tablet (0.4 mg total) under the tongue every 5 (five) minutes as needed for chest [name]. 25 tablet prn  . pantoprazole (PROTONIX) 40 MG tablet Take 40 mg by mouth daily.     . polyvinyl alcohol (LIQUIFILM TEARS) 1.4 % ophthalmic solution Place 1 drop into both eyes daily as needed for dry eyes.    . potassium chloride SA (K-DUR,KLOR-CON) 20 MEQ tablet Take 1 tablet (20 mEq total) by mouth daily. 30 tablet 3  . rivaroxaban (XARELTO) 20 MG TABS tablet Take 1 tablet (20 mg total) by mouth daily with supper. 30 tablet 11   No current facility-administered medications for this encounter.     No Known Allergies  Social History   Social History  . Marital status: Married    Spouse name: N/A  . Number of children: N/A  . Years of education: N/A   Occupational History  . Not on file.   Social  History Main Topics  . Smoking status: Never Smoker  . Smokeless tobacco: Never Used  . Alcohol use No  . Drug use: No  . Sexual activity: Not on file   Other Topics Concern  . Not on file   Social History Narrative  . No narrative on file    Family History  Problem Relation Age of Onset  . Heart attack Father 70    ROS- All systems are reviewed and negative except as per the HPI above  Physical Exam: Vitals:   11/25/16 0837  BP: 122/70  Pulse: 82  Weight: 216 lb 9.6 oz (98.2 kg)  Height: 5\' 8"  (1.727 m)   Wt Readings from Last 3 Encounters:  11/25/16 216 lb 9.6 oz (98.2 kg)  11/08/16 216 lb (98 kg)  10/24/16 223 lb 1.7 oz (101.2 kg)    Labs: Lab Results  Component Value Date   NA 142 10/16/2016   K 4.4 10/16/2016   CL 107 10/16/2016   CO2 25 10/16/2016   GLUCOSE 87 10/16/2016   BUN 25 10/16/2016   CREATININE 1.72 (H) 10/16/2016   CALCIUM 8.9 10/16/2016   MG 1.9 08/05/2016   Lab Results  Component Value Date   INR 2.94 08/03/2016   Lab Results  Component Value Date   CHOL 140 01/01/2013   HDL 38.20 (L) 01/01/2013   TRIG 230.0 (H) 01/01/2013     GEN- The patient is well appearing, alert and oriented x 3 today.   Head- normocephalic, atraumatic Eyes-  Sclera clear, conjunctiva pink Ears- hearing intact Oropharynx- clear Neck- supple, no JVP Lymph- no cervical lymphadenopathy Lungs- Clear to ausculation bilaterally, normal work of breathing Heart- Regular rate and rhythm, no murmurs, rubs or gallops, PMI not laterally displaced GI- soft, NT, ND, + BS Extremities- no clubbing, cyanosis, or edema MS- no significant deformity or atrophy Skin- no rash or lesion Psych- euthymic mood, full affect Neuro- strength and sensation are intact  EKG- NSR at 82 bpm, pr int 166 ms, qrs int 100 ms, qtc 472 ms Epic records reviewed   Assessment and Plan: 1. afib S/p ablation Maintaining SR Continue amiodarone for now Continue xarelto for CHA2DS2VASc  score of at least 4  2. HTN Stable   F/u with Dr. [name] 3/19 afib clinic as needed   Peter [name], [facility] [address] [place],  [address] [phone]

## 2016-11-29 ENCOUNTER — Telehealth: Payer: Self-pay | Admitting: *Deleted

## 2016-11-29 NOTE — Progress Notes (Signed)
After several attempts to get patient's office note addended. I will send what I have to see if the company can get BIPAP approved. Referral sent to [facility].

## 2016-11-29 NOTE — Telephone Encounter (Signed)
Opened in error

## 2016-11-29 NOTE — Telephone Encounter (Signed)
-----   Message from Troy Sine, MD sent at 10/30/2016 10:05 AM EST ----- Mariann Laster please notify pt set up with DME for BiPAP ASAP and f/u sleep clinic

## 2016-12-03 ENCOUNTER — Other Ambulatory Visit: Payer: Self-pay | Admitting: Physician Assistant

## 2016-12-09 NOTE — Telephone Encounter (Signed)
The patient had been notified by Barry Brunner

## 2017-01-27 ENCOUNTER — Ambulatory Visit (INDEPENDENT_AMBULATORY_CARE_PROVIDER_SITE_OTHER): Payer: Medicare Other | Admitting: Cardiology

## 2017-01-27 ENCOUNTER — Encounter: Payer: Self-pay | Admitting: Cardiology

## 2017-01-27 VITALS — BP 130/86 | HR 72 | Ht 68.0 in | Wt 227.0 lb

## 2017-01-27 DIAGNOSIS — I481 Persistent atrial fibrillation: Secondary | ICD-10-CM

## 2017-01-27 DIAGNOSIS — I4819 Other persistent atrial fibrillation: Secondary | ICD-10-CM

## 2017-01-27 NOTE — Patient Instructions (Signed)
Medication Instructions:    Your physician recommends that you continue on your current medications as directed. Please refer to the Current Medication list given to you today.  - If you need a refill on your cardiac medications before your next appointment, please call your pharmacy.   Labwork:  None ordered  Testing/Procedures:  None ordered  Follow-Up:  Your physician recommends that you schedule a follow-up appointment in: 3 months with Dr. Camnitz.  Thank you for choosing [facility]!!   Ashlen Kiger, RN [phone]         

## 2017-01-27 NOTE — Progress Notes (Signed)
Electrophysiology Office Note   Date:  01/27/2017   ID:  Peter [name], DOB 08-Oct-1946, MRN [id number]  PCP:  [name], MD  Cardiologist:  Marlou Porch Primary Electrophysiologist:  Constance Haw, MD    Chief Complaint  Patient presents with  . Follow-up    Persistent AFib/3 months post ablation     History of Present Illness: Peter [name] is a 71 y.o. male who presents today for electrophysiology evaluation.   History of CAD s/p PCI to LADx3 in 2006, previously chronic diastolic CHF, dyslipidemia, GERD, h/o GIB, HTN, hypertriglyceridemia, iron deficiency anemia, obesity, CKD stage III, recently diagnosed afib who was admitted with persistent afib (CHADSVASC 4). AF ablation 10/24/16. His remained in sinus rhythm since ablation. He says that he feels that his symptoms are improving and he is having much less fatigue since ablation.  Today, he denies symptoms of palpitations, chest pain, PND, lower extremity edema, claudication, dizziness, presyncope, syncope, bleeding, or neurologic sequela. The patient is tolerating medications without difficulties.    Past Medical History:  Diagnosis Date  . At risk for sleep apnea    STOP-BANG= 5       SENT TO PCP 07-12-2015  . CAD (coronary artery disease)    a. s/p PCI with DES x 3 to LAD in 2006 with bifurcation lesion and Plavix indefinitely was recommended.  . Chronic combined systolic and diastolic CHF (congestive heart failure) ([place])    a. Prior EF normal but EF dropped to 30-35% in 07/2016 in setting of AFIB.  . CKD (chronic kidney disease), stage III   . Dyslipidemia   . Dyspnea on exertion   . GERD (gastroesophageal reflux disease)   . History of GI bleed   . History of kidney stones   . History of MI (myocardial infarction)    11-05-2005--  periprocedural MI  (cardiac cath) per dr [name] note  . Hypertension   . Hypertriglyceridemia   . Iron deficiency anemia   . Persistent atrial fibrillation ([place])    a. 07/2016,  started on Eliquis -> readmitted later that month for AF RVR/acute HF - initially unsuccessful DCCV following TEE, so loaded with amio with repeat successful DCCV 07/2816.  . Right ureteral stone   . S/P drug eluting coronary stent placement    x3  to LAD  2006   Past Surgical History:  Procedure Laterality Date  . CARDIOVASCULAR STRESS TEST  01-06-2013  dr [name]   normal perfusion study/  no ischemia or infarct/  normal LV function and wall motion , ef 57%  . CARDIOVERSION N/A 08/05/2016   Procedure: CARDIOVERSION;  Surgeon: Sanda Klein, MD;  Location: [facility];  Service: Cardiovascular;  Laterality: N/A;  . CARDIOVERSION N/A 08/08/2016   Procedure: CARDIOVERSION;  Surgeon: Jerline Pain, MD;  Location[facility] END[facility]COPY;  Service: Cardiovascular;  Laterality: N/A;  . CORONARY ANGIOPLASTY  11-13-2005 dr [name]   Successful touch up post-dilatation of the 3 tandem overlying Cypher stents in proximal to mid LAD (based on IVUS 0% stenosis and patent diagonal branch)/  Cutting Balloon Angioplasty of Ramus branch  . CORONARY ANGIOPLASTY WITH STENT PLACEMENT  11-05-2005   dr [name]   PCI and DES x3 to birfurcation lesion LAD and diagonal branch of LAD/   20% dLM,  30-40% RCA, ef 60%  . CYSTOSCOPY WITH RETROGRADE PYELOGRAM, URETEROSCOPY AND STENT PLACEMENT Right 07/14/2015   Procedure: CYSTOSCOPY WITH RETROGRADE PYELOGRAM, URETEROSCOPY AND STENT PLACEMENT;  Surgeon: Alexis Frock, MD;  Location: [facility];  Service: Urology;  Laterality: Right;  . ELECTROPHYSIOLOGIC STUDY N/A 10/24/2016   Procedure: Atrial Fibrillation Ablation;  Surgeon: Dyan Labarbera Meredith Leeds, MD;  Location: [place] [facility];  Service: Cardiovascular;  Laterality: N/A;  . EXTRACORPOREAL SHOCK WAVE LITHOTRIPSY  yrs ago  . IR GENERIC HISTORICAL  10/23/2016   IR FLUORO GUIDE CV LINE RIGHT 10/23/2016 Greggory Keen, MD [facility]  . IR GENERIC HISTORICAL  10/23/2016   IR US GUIDE VASC ACCESS RIGHT 10/23/2016  Greggory Keen, MD [facility]  . LEFT URETEROSCOPIC STONE EXTRACTION  04-10-2006  . STONE EXTRACTION WITH BASKET Right 07/14/2015   Procedure: STONE EXTRACTION WITH BASKET;  Surgeon: Alexis Frock, MD;  Location: [facility];  Service: Urology;  Laterality: Right;  . TEE WITHOUT CARDIOVERSION N/A 08/05/2016   Procedure: TRANSESOPHAGEAL ECHOCARDIOGRAM (TEE);  Surgeon: Sanda Klein, MD;  Location: [facility] ENDOSCOPY;  Service: Cardiovascular;  Laterality: N/A;  . TRANSTHORACIC ECHOCARDIOGRAM  02-04-2012   grade I diastolic dysfunction/  ef 55-60%/  trivial MR and TR/  mild LAE     Current Outpatient Prescriptions  Medication Sig Dispense Refill  . amiodarone (PACERONE) 200 MG tablet Take 200 mg by mouth daily.    Marland Kitchen amLODipine (NORVASC) 5 MG tablet Take 5 mg by mouth daily.    Marland Kitchen atorvastatin (LIPITOR) 40 MG tablet Take 1 tablet (40 mg total) by mouth every morning. 90 tablet 3  . carvedilol (COREG) 6.25 MG tablet TAKE 1 TABLET(6.25 MG) BY MOUTH TWICE DAILY WITH A MEAL 60 tablet 2  . diphenhydramine-acetaminophen (TYLENOL PM) 25-500 MG TABS tablet Take 1 tablet by mouth at bedtime.    . fenofibrate (TRICOR) 145 MG tablet Take 145 mg by mouth daily.    . ferrous sulfate 325 (65 FE) MG tablet Take 325 mg by mouth every morning.     . furosemide (LASIX) 40 MG tablet TAKE 1 TABLET(40 MG) BY MOUTH DAILY 30 tablet 11  . lisinopril (PRINIVIL,ZESTRIL) 40 MG tablet Take 1 tablet (40 mg total) by mouth daily. 90 tablet 3  . nitroGLYCERIN (NITROSTAT) 0.4 MG SL tablet Place 1 tablet (0.4 mg total) under the tongue every 5 (five) minutes as needed for chest pain. 25 tablet prn  . pantoprazole (PROTONIX) 40 MG tablet Take 40 mg by mouth daily.     . polyvinyl alcohol (LIQUIFILM TEARS) 1.4 % ophthalmic solution Place 1 drop into both eyes daily as needed for dry eyes.    . potassium chloride SA (K-DUR,KLOR-CON) 20 MEQ tablet TAKE 1 TABLET(20 MEQ) BY MOUTH DAILY 30 tablet 11  . rivaroxaban (XARELTO)  20 MG TABS tablet Take 1 tablet (20 mg total) by mouth daily with supper. 30 tablet 11   No current facility-administered medications for this visit.     Allergies:   Patient has no known allergies.   Social History:  The patient  reports that he has never smoked. He has never used smokeless tobacco. He reports that he does not drink alcohol or use drugs.   Family History:  The patient's family history includes Heart attack (age of onset: 43) in his father.    ROS:  Please see the history of present illness.   Otherwise, review of systems is positive for none.   All other systems are reviewed and negative.    PHYSICAL EXAM: VS:  BP 130/86   Pulse 72   Ht 5\' 8"  (1.727 m)   Wt 227 lb (103 kg)   BMI 34.52 kg/m  , BMI  Body mass index is 34.52 kg/m. GEN: Well nourished, well developed, in no acute distress  HEENT: normal  Neck: no JVD, carotid bruits, or masses Cardiac: RRR; no murmurs, rubs, or gallops,no edema  Respiratory:  clear to auscultation bilaterally, normal work of breathing GI: soft, nontender, nondistended, + BS MS: no deformity or atrophy  Skin: warm and dry Neuro:  Strength and sensation are intact Psych: euthymic mood, full affect  EKG:  EKG is ordered today. Personal review of the ekg ordered shows sinus rhythm, left axis deviation, lateral infarct, rate 72  Recent Labs: 07/17/2016: ALT 22 08/03/2016: B Natriuretic Peptide 414.8; TSH 0.871 08/05/2016: Magnesium 1.9 10/16/2016: BUN 25; Creat 1.72; Potassium 4.4; Sodium 142 10/25/2016: Hemoglobin 13.4; Platelets 166    Lipid Panel     Component Value Date/Time   CHOL 140 01/01/2013 1037   TRIG 230.0 (H) 01/01/2013 1037   HDL 38.20 (L) 01/01/2013 1037   CHOLHDL 4 01/01/2013 1037   VLDL 46.0 (H) 01/01/2013 1037   LDLDIRECT 68.6 01/01/2013 1037     Wt Readings from Last 3 Encounters:  01/27/17 227 lb (103 kg)  11/25/16 216 lb 9.6 oz (98.2 kg)  11/08/16 216 lb (98 kg)      Other studies  Reviewed: Additional studies/ records that were reviewed today include: TEE 08/05/16  Review of the above records today demonstrates:  - Left ventricle: The cavity size was normal. Systolic function was   moderately to severely reduced. The estimated ejection fraction   was in the range of 30% to 35%. Moderate diffuse hypokinesis with   no identifiable regional variations. No evidence of thrombus. - Aortic valve: There was trivial regurgitation. - Mitral valve: There was mild regurgitation directed centrally. - Left atrium: The atrium was severely dilated. No evidence of   thrombus in the atrial cavity or appendage.  There was   moderate continuous spontaneous echo contrast (&quot;smoke&quot;) in the   cavity and the appendage. - Atrial septum: No defect or patent foramen ovale was identified. - Tricuspid valve: There was moderate regurgitation directed   centrally.   ASSESSMENT AND PLAN:  1.  Persistent atrial fibrillation/flutter: Currently on Xarelto, amiodarone, with 3 cardioversions in the past. AF ablation 10/24/16. He has been in normal rhythm since ablation. We'll continue current management with amiodarone. He may be a little cough amiodarone at the next visit.  This patients CHA2DS2-VASc Score and unadjusted Ischemic Stroke Rate (% per year) is equal to 4.8 % stroke rate/year from a score of 4  Above score calculated as 1 point each if present [CHF, HTN, DM, Vascular=MI/PAD/Aortic Plaque, Age if 65-74, or Male] Above score calculated as 2 points each if present [Age > 75, or Stroke/TIA/TE]  2. Likely nonischemic cardiomyopathy: EF is 30-35% on his TEE from the end of September. It was normal on 9/6. Currently he is on Coreg and lisinopril and is taking Lasix.   3. HTN: Well controlled today  4. CAD s/p PCI to LAD in 2006: Currently asymptomatic  5. Snoring: sleep study ordered to assess for OSA.  Therapy if OSA is present Zamauri Nez help to treat AF.  Current medicines are  reviewed at length with the patient today.   The patient does not have concerns regarding his medicines.  The following changes were made today:  none  Labs/ tests ordered today include:  Orders Placed This Encounter  Procedures  . EKG 12-Lead     Disposition:   FU with Niti Leisure 3 months  Signed,  Nekeisha Aure Meredith Leeds, MD  01/27/2017 10:52 AM     [place] [address] [phone] (office) [phone] (fax)

## 2017-02-18 ENCOUNTER — Other Ambulatory Visit: Payer: Self-pay | Admitting: Cardiology

## 2017-02-24 ENCOUNTER — Encounter: Payer: Self-pay | Admitting: Cardiology

## 2017-02-24 ENCOUNTER — Ambulatory Visit (INDEPENDENT_AMBULATORY_CARE_PROVIDER_SITE_OTHER): Payer: Medicare Other | Admitting: Cardiology

## 2017-02-24 VITALS — BP 122/72 | HR 69 | Ht 68.0 in | Wt 232.2 lb

## 2017-02-24 DIAGNOSIS — I481 Persistent atrial fibrillation: Secondary | ICD-10-CM

## 2017-02-24 DIAGNOSIS — I2510 Atherosclerotic heart disease of native coronary artery without angina pectoris: Secondary | ICD-10-CM

## 2017-02-24 DIAGNOSIS — I4819 Other persistent atrial fibrillation: Secondary | ICD-10-CM

## 2017-02-24 MED ORDER — CARVEDILOL 12.5 MG PO TABS
12.5000 mg | ORAL_TABLET | Freq: Two times a day (BID) | ORAL | Status: DC
Start: 2017-02-24 — End: 2017-04-09

## 2017-02-24 MED ORDER — NITROGLYCERIN 0.4 MG SL SUBL: 0.4000 mg | SUBLINGUAL_TABLET | SUBLINGUAL | 99 refills | Status: DC | PRN

## 2017-02-24 NOTE — Progress Notes (Signed)
Cardiology Office Note    Date:  02/24/2017   ID:  Peter [name], DOB [birth year], MRN [id number]  PCP:  [name], MD  Cardiologist:   Candee Furbish, MD     History of Present Illness:  Peter [name] is a 71 y.o. male with coronary artery disease status post PCI to the LAD 3 in 2006, persistent atrial fibrillation status post atrial fibrillation ablation on 10/24/16 who remains in sinus rhythm since then. Feeling better, less fatigue. He's had a prior history as well of GI bleed, GERD, hypertriglyceridemia, iron deficiency anemia, obesity, chronic systolic heart failure.  His EF on transesophageal echocardiogram 08/05/16 showed EF of 30-35%. This was previously normal.  End of Feb started to feel a little better.  He still complaining of lower extremity edema which correlates with his amlodipine use.  Overall no chest pain, no shortness of breath. Fatigue has improved.   Past Medical History:  Diagnosis Date  . At risk for sleep apnea    STOP-BANG= 5       SENT TO PCP 07-12-2015  . CAD (coronary artery disease)    a. s/p PCI with DES x 3 to LAD in 2006 with bifurcation lesion and Plavix indefinitely was recommended.  . Chronic combined systolic and diastolic CHF (congestive heart failure) ([place])    a. Prior EF normal but EF dropped to 30-35% in 07/2016 in setting of AFIB.  . CKD (chronic kidney disease), stage III   . Dyslipidemia   . Dyspnea on exertion   . GERD (gastroesophageal reflux disease)   . History of GI bleed   . History of kidney stones   . History of MI (myocardial infarction)    11-05-2005--  periprocedural MI  (cardiac cath) per dr [name] note  . Hypertension   . Hypertriglyceridemia   . Iron deficiency anemia   . Persistent atrial fibrillation ([place])    a. 07/2016, started on Eliquis -> readmitted later that month for AF RVR/acute HF - initially unsuccessful DCCV following TEE, so loaded with amio with repeat successful DCCV 07/2816.  . Right ureteral  stone   . S/P drug eluting coronary stent placement    x3  to LAD  2006    Past Surgical History:  Procedure Laterality Date  . CARDIOVASCULAR STRESS TEST  01-06-2013  dr [name]   normal perfusion study/  no ischemia or infarct/  normal LV function and wall motion , ef 57%  . CARDIOVERSION N/A 08/05/2016   Procedure: CARDIOVERSION;  Surgeon: Sanda Klein, MD;  Location: [facility];  Service: Cardiovascular;  Laterality: N/A;  . CARDIOVERSION N/A 08/08/2016   Procedure: CARDIOVERSION;  Surgeon: Jerline Pain, MD;  Location: [facility]COPY;  Service: Cardiovascular;  Laterality: N/A;  . CORONARY ANGIOPLASTY  11-13-2005 dr [name]   Successful touch up post-dilatation of the 3 tandem overlying Cypher stents in proximal to mid LAD (based on IVUS 0% stenosis and patent diagonal branch)/  Cutting Balloon Angioplasty of Ramus branch  . CORONARY ANGIOPLASTY WITH STENT PLACEMENT  11-05-2005   dr [name]   PCI and DES x3 to birfurcation lesion LAD and diagonal branch of LAD/   20% dLM,  30-40% RCA, ef 60%  . CYSTOSCOPY WITH RETROGRADE PYELOGRAM, URETEROSCOPY AND STENT PLACEMENT Right 07/14/2015   Procedure: CYSTOSCOPY WITH RETROGRADE PYELOGRAM, URETEROSCOPY AND STENT PLACEMENT;  Surgeon: Alexis Frock, MD;  Location: [facility];  Service: Urology;  Laterality: Right;  . ELECTROPHYSIOLOGIC STUDY N/A 10/24/2016   Procedure: Atrial Fibrillation  Ablation;  Surgeon: Will Meredith Leeds, MD;  Location: [place] [facility];  Service: Cardiovascular;  Laterality: N/A;  . EXTRACORPOREAL SHOCK WAVE LITHOTRIPSY  yrs ago  . IR GENERIC HISTORICAL  10/23/2016   IR FLUORO GUIDE CV LINE RIGHT 10/23/2016 Greggory Keen, MD [facility]  . IR GENERIC HISTORICAL  10/23/2016   IR US GUIDE VASC ACCESS RIGHT 10/23/2016 Greggory Keen, MD [facility]  . LEFT URETEROSCOPIC STONE EXTRACTION  04-10-2006  . STONE EXTRACTION WITH BASKET Right 07/14/2015   Procedure: STONE EXTRACTION WITH BASKET;  Surgeon:  Alexis Frock, MD;  Location: [facility];  Service: Urology;  Laterality: Right;  . TEE WITHOUT CARDIOVERSION N/A 08/05/2016   Procedure: TRANSESOPHAGEAL ECHOCARDIOGRAM (TEE);  Surgeon: Sanda Klein, MD;  Location: [facility] ENDOSCOPY;  Service: Cardiovascular;  Laterality: N/A;  . TRANSTHORACIC ECHOCARDIOGRAM  02-04-2012   grade I diastolic dysfunction/  ef 55-60%/  trivial MR and TR/  mild LAE    Current Medications: Outpatient Medications Prior to Visit  Medication Sig Dispense Refill  . amiodarone (PACERONE) 200 MG tablet Take 200 mg by mouth daily.    Marland Kitchen atorvastatin (LIPITOR) 40 MG tablet Take 1 tablet (40 mg total) by mouth every morning. 90 tablet 3  . diphenhydramine-acetaminophen (TYLENOL PM) 25-500 MG TABS tablet Take 1 tablet by mouth at bedtime.    . fenofibrate (TRICOR) 145 MG tablet Take 145 mg by mouth daily.    . ferrous sulfate 325 (65 FE) MG tablet Take 325 mg by mouth every morning.     . furosemide (LASIX) 40 MG tablet TAKE 1 TABLET(40 MG) BY MOUTH DAILY 30 tablet 11  . pantoprazole (PROTONIX) 40 MG tablet Take 40 mg by mouth daily.     . polyvinyl alcohol (LIQUIFILM TEARS) 1.4 % ophthalmic solution Place 1 drop into both eyes daily as needed for dry eyes.    . potassium chloride SA (K-DUR,KLOR-CON) 20 MEQ tablet TAKE 1 TABLET(20 MEQ) BY MOUTH DAILY 30 tablet 11  . rivaroxaban (XARELTO) 20 MG TABS tablet Take 1 tablet (20 mg total) by mouth daily with supper. 30 tablet 11  . amLODipine (NORVASC) 5 MG tablet Take 5 mg by mouth daily.    . carvedilol (COREG) 6.25 MG tablet TAKE 1 TABLET(6.25 MG) BY MOUTH TWICE DAILY WITH A MEAL 60 tablet 3  . nitroGLYCERIN (NITROSTAT) 0.4 MG SL tablet Place 1 tablet (0.4 mg total) under the tongue every 5 (five) minutes as needed for chest pain. 25 tablet prn  . lisinopril (PRINIVIL,ZESTRIL) 40 MG tablet Take 1 tablet (40 mg total) by mouth daily. 90 tablet 3   No facility-administered medications prior to visit.       Allergies:   Patient has no known allergies.   Social History   Social History  . Marital status: Married    Spouse name: N/A  . Number of children: N/A  . Years of education: N/A   Social History Main Topics  . Smoking status: Never Smoker  . Smokeless tobacco: Never Used  . Alcohol use No  . Drug use: No  . Sexual activity: Not Asked   Other Topics Concern  . None   Social History Narrative  . None     Family History:  The patient's family history includes Heart attack (age of onset: 10) in his father.   ROS:   Please see the history of present illness.    ROS All other systems reviewed and are negative.   PHYSICAL EXAM:   VS:  BP 122/72   Pulse 69   Ht 5\' 8"  (1.727 m)   Wt 232 lb 3.2 oz (105.3 kg)   BMI 35.31 kg/m    GEN: Well nourished, well developed, in no acute distress  HEENT: normal  Neck: no JVD, carotid bruits, or masses Cardiac: RRR; no murmurs, rubs, or gallops, 2+ LE edema  Respiratory:  clear to auscultation bilaterally, normal work of breathing GI: soft, nontender, nondistended, + BS, obese MS: no deformity or atrophy  Skin: warm and dry, no rash Neuro:  Alert and Oriented x 3, Strength and sensation are intact Psych: euthymic mood, full affect  Wt Readings from Last 3 Encounters:  02/24/17 232 lb 3.2 oz (105.3 kg)  01/27/17 227 lb (103 kg)  11/25/16 216 lb 9.6 oz (98.2 kg)      Studies/Labs Reviewed:   EKG:  No new EKG today. Previous sinus rhythm  Recent Labs: 07/17/2016: ALT 22 08/03/2016: B Natriuretic Peptide 414.8; TSH 0.871 08/05/2016: Magnesium 1.9 10/16/2016: BUN 25; Creat 1.72; Potassium 4.4; Sodium 142 10/25/2016: Hemoglobin 13.4; Platelets 166   Lipid Panel    Component Value Date/Time   CHOL 140 01/01/2013 1037   TRIG 230.0 (H) 01/01/2013 1037   HDL 38.20 (L) 01/01/2013 1037   CHOLHDL 4 01/01/2013 1037   VLDL 46.0 (H) 01/01/2013 1037   LDLDIRECT 68.6 01/01/2013 1037    Additional studies/ records that were  reviewed today include:  ECHO: 11/22/16: - Left ventricle: The cavity size was normal. Wall thickness was   increased in a pattern of mild LVH. Systolic function was normal.   The estimated ejection fraction was in the range of 55% to 60%.   Left ventricular diastolic function parameters were normal. - Mitral valve: Calcified annulus. Mildly thickened leaflets . - Left atrium: The atrium was mildly dilated. - Atrial septum: No defect or patent foramen ovale was identified. EF was previously 35.   ASSESSMENT:    1. Persistent atrial fibrillation ([place])   2. Coronary artery disease involving native heart without angina pectoris, unspecified vessel or lesion type   3. Morbid obesity ([place])   4. Coronary artery disease involving native coronary artery of native heart without angina pectoris      PLAN:  In order of problems listed above:  Transient dilated cardiomyopathy  - Ejection fraction returned to normal after restoring normal rhythm post ablation. Excellent.  Essential hypertension  - Trouble with amlodipine causing lower extremity edema. This is been a chronic issue for him.  - We will go ahead and increase his carvedilol to 12.5 mg twice a day and stop the amlodipine. Hopefully this will help.  - He is going to get a new blood pressure monitor.  Coronary artery disease  - Post LAD stent in 2006  - Overall doing well.  Persistent atrial fibrillation/flutter  - Dr. Macky Lower note reviewed  - Continue amiodarone until he sees next visit.  - Continue Xarelto for now as well.  Morbid obesity  - BMI greater than 35 with multiple comorbidities  - Continue to encourage weight loss.  He will let us know if his heart rate decreases with the increase in carvedilol. We are stopping amlodipine because of lower extremity edema   Medication Adjustments/Labs and Tests Ordered: Current medicines are reviewed at length with the patient today.  Concerns regarding medicines are outlined  above.  Medication changes, Labs and Tests ordered today are listed in the Patient Instructions below. Patient Instructions  Medication Instructions:  Please increase Carvedilol  to 12.5 mg twice a day. The current medical regimen is effective;  continue present plan and medications.  Follow-Up: Follow up in 6 months with [name].  You will receive a letter in the mail 2 months before you are due.  Please call us when you receive this letter to schedule your follow up appointment.  If you need a refill on your cardiac medications before your next appointment, please call your pharmacy.  Thank you for choosing [facility]!!        Signed, Candee Furbish, MD  02/24/2017 12:08 PM    [address], [place], [place]  [address] Phone: [phone]; Fax: [phone]

## 2017-02-24 NOTE — Patient Instructions (Signed)
Medication Instructions:  Please increase Carvedilol to 12.5 mg twice a day. The current medical regimen is effective;  continue present plan and medications.  Follow-Up: Follow up in 6 months with [name].  You will receive a letter in the mail 2 months before you are due.  Please call us when you receive this letter to schedule your follow up appointment.  If you need a refill on your cardiac medications before your next appointment, please call your pharmacy.  Thank you for choosing [facility]!!

## 2017-03-04 ENCOUNTER — Ambulatory Visit (INDEPENDENT_AMBULATORY_CARE_PROVIDER_SITE_OTHER): Payer: Medicare Other | Admitting: Cardiovascular Disease

## 2017-03-04 ENCOUNTER — Encounter: Payer: Self-pay | Admitting: Cardiovascular Disease

## 2017-03-04 VITALS — BP 112/66 | HR 68 | Ht 68.0 in | Wt 239.0 lb

## 2017-03-04 DIAGNOSIS — I2583 Coronary atherosclerosis due to lipid rich plaque: Secondary | ICD-10-CM

## 2017-03-04 DIAGNOSIS — Z9989 Dependence on other enabling machines and devices: Secondary | ICD-10-CM | POA: Diagnosis not present

## 2017-03-04 DIAGNOSIS — G4733 Obstructive sleep apnea (adult) (pediatric): Secondary | ICD-10-CM | POA: Diagnosis not present

## 2017-03-04 DIAGNOSIS — Z9889 Other specified postprocedural states: Secondary | ICD-10-CM

## 2017-03-04 DIAGNOSIS — I2510 Atherosclerotic heart disease of native coronary artery without angina pectoris: Secondary | ICD-10-CM | POA: Diagnosis not present

## 2017-03-04 DIAGNOSIS — Z6836 Body mass index (BMI) 36.0-36.9, adult: Secondary | ICD-10-CM

## 2017-03-04 DIAGNOSIS — Z8679 Personal history of other diseases of the circulatory system: Secondary | ICD-10-CM

## 2017-03-04 DIAGNOSIS — I10 Essential (primary) hypertension: Secondary | ICD-10-CM | POA: Diagnosis not present

## 2017-03-04 DIAGNOSIS — I480 Paroxysmal atrial fibrillation: Secondary | ICD-10-CM | POA: Diagnosis not present

## 2017-03-04 NOTE — Patient Instructions (Signed)
Your physician wants you to follow-up in: 1 year or sooner if need with Dr Claiborne Billings for sleep. You will receive a reminder letter in the mail two months in advance. If you don't receive a letter, please call our office to schedule the follow-up appointment.

## 2017-03-09 NOTE — Progress Notes (Signed)
Cardiology Office Note    Date:  03/09/2017   ID:  Peter [name], DOB 1946/10/22, MRN [id number]  PCP:  [name], MD  Cardiologist:  [name], MD (sleep); [name]  No sleep clinic evaluation  History of Present Illness:  Peter [name] is a 71 y.o. male who is followed by Dr. Tana Conch for cardiology care.  The patient has a history of CAD and is status post PCI to his LAD in 2006, and is status post atrial fibrillation ablation in December 2017.  He has a history of chronic systolic heart failure, remote GI bleed, GERD, iron deficiency anemia, and hyperlipidemia.    Due to his significant atrial fibrillation history, he was referred for a sleep study which was done as a split-night protocol on 10/09/2016.  This revealed severe sleep apnea with an AHI of 84.2 per hour.  He was unable to achieve rems sleep on the diagnostic portion of the study.  He at significant oxygen desaturation to 63% with time below 88% at 53.9 minutes.  CPAP third.  He was implemented and he was titrated up to 19 cm water pressure; however, due to continued events.  He was switched to BiPAP therapy.  He was titrated up to 27/23 and AHI was still increased at 6.7, with an RDI of 13.4, and oxygen desaturation to 87%.  Optimal titration was not achieved.  During that study, he was in atrial fibrillation.  He was referred to renal care for BiPAP set up and this was initiated on 12/09/2016.  In the office today.  I obtained a download of his ResMed AirCurve 10 VAuto  Unit which is currently set at a maximum IPAP pressure of 25 and minimum EPAP pressure of 20.  He is meeting compliance standards with 97% of days with usage and 93% of days with usage greater than 4 hours.  Average usage on days used is 7 hours and 33 minutes.  His AHI was excellent at 0.7.  He is having difficulty with a very high pressure.  Further review of his download indicates his 95th percentile EPAP pressures 20.1 and IPAP pressure 24.1.   He has a Dreamwear full face mask which has just recently come out from [employer] which he is tolerating exceptionally well and much better than the other full face mask that he had been using.  When I review his BiPAP settings, he currently has a ramp time of 45 minutes with initiation of therapy at 14/10.  He feels significant improvement since initiating CPAP therapy.  His previous nocturia has significantly reduced.  He is unaware of breakthrough snoring.  He had leak with his other full face mask, but the new Dreamwear mask is significantly improved.  He denies any breakthrough snoring, daytime sleepiness, hypersomnolence, bruxism, restless legs, hypnogognic hallucinations, or cataplexy.  There are no parasomnias.  An Epworth Sleepiness Scale score was calculated in the office today as shown below:  Epworth Sleepiness Scale: Situation   Chance of Dozing/Sleeping (0 = never , 1 = slight chance , 2 = moderate chance , 3 = high chance )   sitting and reading 0   watching TV 0   sitting inactive in a public place 0   being a passenger in a motor vehicle for an hour or more 1   lying down in the afternoon 3   sitting and talking to someone 0   sitting quietly after lunch (no alcohol) 0   while stopped for a few  minutes in traffic as the driver 0   Total Score  4   He presents for evaluation  Past Medical History:  Diagnosis Date  . At risk for sleep apnea    STOP-BANG= 5       SENT TO PCP 07-12-2015  . CAD (coronary artery disease)    a. s/p PCI with DES x 3 to LAD in 2006 with bifurcation lesion and Plavix indefinitely was recommended.  . Chronic combined systolic and diastolic CHF (congestive heart failure) ([place])    a. Prior EF normal but EF dropped to 30-35% in 07/2016 in setting of AFIB.  . CKD (chronic kidney disease), stage III   . Dyslipidemia   . Dyspnea on exertion   . GERD (gastroesophageal reflux disease)   . History of GI bleed   . History of kidney stones   . History  of MI (myocardial infarction)    11-05-2005--  periprocedural MI  (cardiac cath) per dr [name] note  . Hypertension   . Hypertriglyceridemia   . Iron deficiency anemia   . Persistent atrial fibrillation ([place])    a. 07/2016, started on Eliquis -> readmitted later that month for AF RVR/acute HF - initially unsuccessful DCCV following TEE, so loaded with amio with repeat successful DCCV 07/2816.  . Right ureteral stone   . S/P drug eluting coronary stent placement    x3  to LAD  2006    Past Surgical History:  Procedure Laterality Date  . CARDIOVASCULAR STRESS TEST  01-06-2013  dr [name]   normal perfusion study/  no ischemia or infarct/  normal LV function and wall motion , ef 57%  . CARDIOVERSION N/A 08/05/2016   Procedure: CARDIOVERSION;  Surgeon: Sanda Klein, MD;  Location: [place];  Service: Cardiovascular;  Laterality: N/A;  . CARDIOVERSION N/A 08/08/2016   Procedure: CARDIOVERSION;  Surgeon: Jerline Pain, MD;  Location: [facility] ENDOSCOPY;  Service: Cardiovascular;  Laterality: N/A;  . CORONARY ANGIOPLASTY  11-13-2005 dr [name]   Successful touch up post-dilatation of the 3 tandem overlying Cypher stents in proximal to mid LAD (based on IVUS 0% stenosis and patent diagonal branch)/  Cutting Balloon Angioplasty of Ramus branch  . CORONARY ANGIOPLASTY WITH STENT PLACEMENT  11-05-2005   dr [name]   PCI and DES x3 to birfurcation lesion LAD and diagonal branch of LAD/   20% dLM,  30-40% RCA, ef 60%  . CYSTOSCOPY WITH RETROGRADE PYELOGRAM, URETEROSCOPY AND STENT PLACEMENT Right 07/14/2015   Procedure: CYSTOSCOPY WITH RETROGRADE PYELOGRAM, URETEROSCOPY AND STENT PLACEMENT;  Surgeon: Alexis Frock, MD;  Location: [facility];  Service: Urology;  Laterality: Right;  . ELECTROPHYSIOLOGIC STUDY N/A 10/24/2016   Procedure: Atrial Fibrillation Ablation;  Surgeon: Will Meredith Leeds, MD;  Location: [place] CV LAB;  Service: Cardiovascular;  Laterality: N/A;  . EXTRACORPOREAL  SHOCK WAVE LITHOTRIPSY  yrs ago  . IR GENERIC HISTORICAL  10/23/2016   IR FLUORO GUIDE CV LINE RIGHT 10/23/2016 Greggory Keen, MD [facility]INTER[facility] RAD  . IR GENERIC HISTORICAL  10/23/2016   IR US GUIDE VASC ACCESS RIGHT 10/23/2016 Greggory Keen, MD [facility]  . LEFT URETEROSCOPIC STONE EXTRACTION  04-10-2006  . STONE EXTRACTION WITH BASKET Right 07/14/2015   Procedure: STONE EXTRACTION WITH BASKET;  Surgeon: Alexis Frock, MD;  Location: [facility];  Service: Urology;  Laterality: Right;  . TEE WITHOUT CARDIOVERSION N/A 08/05/2016   Procedure: TRANSESOPHAGEAL ECHOCARDIOGRAM (TEE);  Surgeon: Sanda Klein, MD;  Location: [place];  Service: Cardiovascular;  Laterality: N/A;  . TRANSTHORACIC ECHOCARDIOGRAM  02-04-2012   grade I diastolic dysfunction/  ef 55-60%/  trivial MR and TR/  mild LAE    Current Medications: Outpatient Medications Prior to Visit  Medication Sig Dispense Refill  . amiodarone (PACERONE) 200 MG tablet Take 200 mg by mouth daily.    Marland Kitchen atorvastatin (LIPITOR) 40 MG tablet Take 1 tablet (40 mg total) by mouth every morning. 90 tablet 3  . carvedilol (COREG) 12.5 MG tablet Take 1 tablet (12.5 mg total) by mouth 2 (two) times daily with a meal.    . diphenhydramine-acetaminophen (TYLENOL PM) 25-500 MG TABS tablet Take 1 tablet by mouth at bedtime.    . fenofibrate (TRICOR) 145 MG tablet Take 145 mg by mouth daily.    . ferrous sulfate 325 (65 FE) MG tablet Take 325 mg by mouth every morning.     . furosemide (LASIX) 40 MG tablet TAKE 1 TABLET(40 MG) BY MOUTH DAILY 30 tablet 11  . nitroGLYCERIN (NITROSTAT) 0.4 MG SL tablet Place 1 tablet (0.4 mg total) under the tongue every 5 (five) minutes as needed for chest pain. X 3 doses 25 tablet prn  . pantoprazole (PROTONIX) 40 MG tablet Take 40 mg by mouth daily.     . polyvinyl alcohol (LIQUIFILM TEARS) 1.4 % ophthalmic solution Place 1 drop into both eyes daily as needed for dry eyes.    . potassium chloride SA  (K-DUR,KLOR-CON) 20 MEQ tablet TAKE 1 TABLET(20 MEQ) BY MOUTH DAILY 30 tablet 11  . rivaroxaban (XARELTO) 20 MG TABS tablet Take 1 tablet (20 mg total) by mouth daily with supper. 30 tablet 11  . lisinopril (PRINIVIL,ZESTRIL) 40 MG tablet Take 1 tablet (40 mg total) by mouth daily. 90 tablet 3   No facility-administered medications prior to visit.      Allergies:   Patient has no known allergies.   Social History   Social History  . Marital status: Married    Spouse name: N/A  . Number of children: N/A  . Years of education: N/A   Social History Main Topics  . Smoking status: Never Smoker  . Smokeless tobacco: Never Used  . Alcohol use No  . Drug use: No  . Sexual activity: Not Asked   Other Topics Concern  . None   Social History Narrative  . None     Family History:  The patient's family history includes Heart attack (age of onset: 23) in his father.   ROS General: Negative; No fevers, chills, or night sweats;  HEENT: Negative; No changes in vision or hearing, sinus congestion, difficulty swallowing Pulmonary: Negative; No cough, wheezing, shortness of breath, hemoptysis Cardiovascular: History of atrial fibrillation, status post ablation, CAD, no recent chest pain GI: Positive for GERD GU: Negative; No dysuria, hematuria, or difficulty voiding Musculoskeletal: Negative; no myalgias, joint pain, or weakness Hematologic/Oncology: Negative; no easy bruising, bleeding Endocrine: Negative; no heat/cold intolerance; no diabetes Neuro: Negative; no changes in balance, headaches Skin: Negative; No rashes or skin lesions Psychiatric: Negative; No behavioral problems, depression Sleep: see HPI Other comprehensive 14 point system review is negative.   PHYSICAL EXAM:   VS:  BP 112/66   Pulse 68   Ht '5\' 8"'  (1.727 m)   Wt 239 lb (108.4 kg)   SpO2 95%   BMI 36.34 kg/m    Wt Readings from Last 3 Encounters:  03/04/17 239 lb (108.4 kg)  02/24/17 232 lb 3.2 oz (105.3 kg)    01/27/17 227 lb (  103 kg)    General: Alert, oriented, no distress.  Skin: normal turgor, no rashes, warm and dry HEENT: Normocephalic, atraumatic. Pupils equal round and reactive to light; sclera anicteric; extraocular muscles intact; Fundi Disks flat Nose without nasal septal hypertrophy Mouth/Parynx benign; Mallinpatti scale 3/4 Neck: No JVD, no carotid bruits; normal carotid upstroke Lungs: clear to ausculatation and percussion; no wheezing or rales Chest wall: without tenderness to palpitation Heart: PMI not displaced, RRR, s1 s2 normal, 1/6 systolic murmur, no diastolic murmur, no rubs, gallops, thrills, or heaves Abdomen: soft, nontender; no hepatosplenomehaly, BS+; abdominal aorta nontender and not dilated by palpation. Back: no CVA tenderness Pulses 2+ Musculoskeletal: full range of motion, normal strength, no joint deformities Extremities: no clubbing cyanosis or edema, Homan's sign negative  Neurologic: grossly nonfocal; Cranial nerves grossly wnl Psychologic: Normal mood and affect   Studies/Labs Reviewed:   EKG:  EKG is ordered today.  ECG (independently read by me): Sinus rhythm at 63 bpm.  Left affix deviation.  Small Q waves in 1 and aVL.  Nondiagnostic T-wave changes V2 and V3.  Recent Labs: BMP Latest Ref Rng & Units 10/16/2016 09/06/2016 08/19/2016  Glucose 65 - 99 mg/dL 87 70 100(H)  BUN 7 - 25 mg/dL 25 28(H) 25  Creatinine 0.70 - 1.18 mg/dL 1.72(H) 1.89(H) 1.97(H)  Sodium 135 - 146 mmol/L 142 143 142  Potassium 3.5 - 5.3 mmol/L 4.4 4.3 4.1  Chloride 98 - 110 mmol/L 107 106 106  CO2 20 - 31 mmol/L '25 26 25  ' Calcium 8.6 - 10.3 mg/dL 8.9 9.3 9.6     Hepatic Function Latest Ref Rng & Units 07/17/2016 02/04/2012 02/03/2012  Total Protein 6.5 - 8.1 g/dL 6.6 6.1 7.3  Albumin 3.5 - 5.0 g/dL 3.7 3.5 4.1  AST 15 - 41 U/L '20 16 16  ' ALT 17 - 63 U/L '22 12 13  ' Alk Phosphatase 38 - 126 U/L 38 45 51  Total Bilirubin 0.3 - 1.2 mg/dL 0.6 0.2(L) 0.2(L)    CBC Latest Ref Rng  & Units 10/25/2016 10/16/2016 08/19/2016  WBC 4.0 - 10.5 K/uL 9.5 6.5 7.8  Hemoglobin 13.0 - 17.0 g/dL 13.4 14.4 14.8  Hematocrit 39.0 - 52.0 % 41.9 43.9 45.7  Platelets 150 - 400 K/uL 166 215 224   Lab Results  Component Value Date   MCV 99.5 10/25/2016   MCV 96.1 10/16/2016   MCV 95.8 08/19/2016   Lab Results  Component Value Date   TSH 0.871 08/03/2016   No results found for: HGBA1C   BNP    Component Value Date/Time   BNP 414.8 (H) 08/03/2016 1103    ProBNP    Component Value Date/Time   PROBNP 144.7 (H) 02/03/2012 1854     Lipid Panel     Component Value Date/Time   CHOL 140 01/01/2013 1037   TRIG 230.0 (H) 01/01/2013 1037   HDL 38.20 (L) 01/01/2013 1037   CHOLHDL 4 01/01/2013 1037   VLDL 46.0 (H) 01/01/2013 1037   LDLDIRECT 68.6 01/01/2013 1037     RADIOLOGY: No results found.   Additional studies/ records that were reviewed today include:  I reviewed the patient's office records, atrial fibrillation clinic, split-night study, [facility] care information, and download.    ASSESSMENT:    1. OSA on CPAP   2. Essential hypertension   3. Coronary artery disease due to lipid rich plaque   4. Paroxysmal atrial fibrillation (HCC)   5. S/P ablation of atrial fibrillation   6. Class 2  severe obesity due to excess calories with serious comorbidity and body mass index (BMI) of 36.0 to 36.9 in adult [facility])      PLAN:  Peter [name] is a 71 year old male who has a history of known CAD, status post PCI to his LAD, and has a history of atrial fibrillation for which he underwent successful ablation in December 2018.  Prior to his ablation procedure, he had undergone a sleep study, which verified very severe sleep apnea with an HI of 84.2 and markedly reduced oxygen desaturation to 63% with time spent below 88% saturation at 53.9 minutes.  He has now been on high pressure.  BiPAP therapy and has noticed significant improvement in his sleep pattern and duration.  His  download confirms that he is meeting Medicare compliance standards.  He has had excellent response with an AHI of 0.7.  He has noticed some difficulty with the high pressure.  I have made adjustments to his ramp time today.  We also have reduced his IPAP maximum to 24 cwp , but this was his 95th percentile average pressure and I did not want to reduce this any further.  He is tolerating therapy much better with his new [employer] Dreamwear  full face mask.  A new download will be obtained in one month.  Body mass index is 36.34 which is consistent with obesity.  Weight loss was recommended.  As long as he remains stable from a sleep perspective, I will see him in one year for reevaluation.   Medication Adjustments/Labs and Tests Ordered: Current medicines are reviewed at length with the patient today.  Concerns regarding medicines are outlined above.  Medication changes, Labs and Tests ordered today are listed in the Patient Instructions below.  Patient Instructions  Your physician wants you to follow-up in: 1 year or sooner if need with Dr Claiborne Billings for sleep. You will receive a reminder letter in the mail two months in advance. If you don't receive a letter, please call our office to schedule the follow-up appointment.     Signed, [name], MD  03/09/2017 12:08 PM    [place] [address], [place], [place], [place]  [address] Phone: [phone]

## 2017-03-17 ENCOUNTER — Other Ambulatory Visit: Payer: Self-pay | Admitting: Cardiology

## 2017-03-21 DIAGNOSIS — I480 Paroxysmal atrial fibrillation: Secondary | ICD-10-CM | POA: Diagnosis not present

## 2017-03-21 DIAGNOSIS — E7800 Pure hypercholesterolemia, unspecified: Secondary | ICD-10-CM | POA: Diagnosis not present

## 2017-03-21 DIAGNOSIS — N183 Chronic kidney disease, stage 3 (moderate): Secondary | ICD-10-CM | POA: Diagnosis not present

## 2017-03-21 DIAGNOSIS — K219 Gastro-esophageal reflux disease without esophagitis: Secondary | ICD-10-CM | POA: Diagnosis not present

## 2017-03-21 DIAGNOSIS — I2510 Atherosclerotic heart disease of native coronary artery without angina pectoris: Secondary | ICD-10-CM | POA: Diagnosis not present

## 2017-03-21 DIAGNOSIS — Z0001 Encounter for general adult medical examination with abnormal findings: Secondary | ICD-10-CM | POA: Diagnosis not present

## 2017-03-21 DIAGNOSIS — Z79899 Other long term (current) drug therapy: Secondary | ICD-10-CM | POA: Diagnosis not present

## 2017-03-27 ENCOUNTER — Telehealth: Payer: Self-pay | Admitting: Cardiology

## 2017-03-27 NOTE — Telephone Encounter (Signed)
New message    Pcp said to speak to you about medication for high triglycerides that will not interfer with with other medicaton

## 2017-03-31 NOTE — Telephone Encounter (Signed)
Pt aware to start Fish oil 4 grams a day. He reports he was recently informed he has stage 3  Renal disease.  He already needs blood work in 3 months and will have a lipid profile at that time as well.

## 2017-03-31 NOTE — Telephone Encounter (Signed)
Try fish oil 4g daily Have him recheck lipid panel in 3 mts  Candee Furbish, MD

## 2017-04-09 ENCOUNTER — Encounter: Payer: Self-pay | Admitting: Cardiology

## 2017-04-09 ENCOUNTER — Other Ambulatory Visit: Payer: Self-pay | Admitting: *Deleted

## 2017-04-09 DIAGNOSIS — L570 Actinic keratosis: Secondary | ICD-10-CM | POA: Diagnosis not present

## 2017-04-09 DIAGNOSIS — Z85828 Personal history of other malignant neoplasm of skin: Secondary | ICD-10-CM | POA: Diagnosis not present

## 2017-04-09 DIAGNOSIS — L821 Other seborrheic keratosis: Secondary | ICD-10-CM | POA: Diagnosis not present

## 2017-04-09 DIAGNOSIS — D1801 Hemangioma of skin and subcutaneous tissue: Secondary | ICD-10-CM | POA: Diagnosis not present

## 2017-04-09 DIAGNOSIS — C44619 Basal cell carcinoma of skin of left upper limb, including shoulder: Secondary | ICD-10-CM | POA: Diagnosis not present

## 2017-04-09 MED ORDER — CARVEDILOL 12.5 MG PO TABS: 12.5000 mg | ORAL_TABLET | Freq: Two times a day (BID) | ORAL | 3 refills | Status: DC

## 2017-04-29 ENCOUNTER — Encounter: Payer: Self-pay | Admitting: Cardiology

## 2017-04-29 ENCOUNTER — Ambulatory Visit (INDEPENDENT_AMBULATORY_CARE_PROVIDER_SITE_OTHER): Payer: Medicare Other | Admitting: Cardiology

## 2017-04-29 ENCOUNTER — Telehealth: Payer: Self-pay | Admitting: *Deleted

## 2017-04-29 VITALS — BP 150/98 | HR 61 | Ht 68.0 in | Wt 233.0 lb

## 2017-04-29 DIAGNOSIS — G4733 Obstructive sleep apnea (adult) (pediatric): Secondary | ICD-10-CM | POA: Diagnosis not present

## 2017-04-29 DIAGNOSIS — I481 Persistent atrial fibrillation: Secondary | ICD-10-CM | POA: Diagnosis not present

## 2017-04-29 DIAGNOSIS — I10 Essential (primary) hypertension: Secondary | ICD-10-CM

## 2017-04-29 DIAGNOSIS — I2510 Atherosclerotic heart disease of native coronary artery without angina pectoris: Secondary | ICD-10-CM | POA: Diagnosis not present

## 2017-04-29 DIAGNOSIS — I2583 Coronary atherosclerosis due to lipid rich plaque: Secondary | ICD-10-CM

## 2017-04-29 DIAGNOSIS — I4819 Other persistent atrial fibrillation: Secondary | ICD-10-CM

## 2017-04-29 NOTE — Progress Notes (Signed)
Electrophysiology Office Note   Date:  04/29/2017   ID:  Peter [name], DOB 01/30/1946, MRN [id number]  PCP:  Peter Amel, MD  Cardiologist:  Peter [name] Primary Electrophysiologist:  Peter Haw, MD    Chief Complaint  Patient presents with  . Atrial Fibrillation     History of Present Illness: Peter [name] is a 71 y.o. male who presents today for electrophysiology evaluation.   History of CAD s/p PCI to LADx3 in 2006, previously chronic diastolic CHF, dyslipidemia, GERD, h/o GIB, HTN, hypertriglyceridemia, iron deficiency anemia, obesity, CKD stage III, recently diagnosed afib who was admitted with persistent afib (CHADSVASC 4). AF ablation 10/24/16.   Today, denies symptoms of palpitations, chest pain, shortness of breath, orthopnea, PND, lower extremity edema, claudication, dizziness, presyncope, syncope, bleeding, or neurologic sequela. The patient is tolerating medications without difficulties and is otherwise without complaint today. Reportedly, he has been diagnosed with stage III CKG. He is quite stressed about that diagnosis. Sycomata, he feels that he has been in sinus rhythm since his ablation on 10/24/16. He has not noted any further episodes of palpitations. He is feeling well today.   Past Medical History:  Diagnosis Date  . At risk for sleep apnea    STOP-BANG= 5       SENT TO PCP 07-12-2015  . CAD (coronary artery disease)    a. s/p PCI with DES x 3 to LAD in 2006 with bifurcation lesion and Plavix indefinitely was recommended.  . Chronic combined systolic and diastolic CHF (congestive heart failure) ([place])    a. Prior EF normal but EF dropped to 30-35% in 07/2016 in setting of AFIB.  . CKD (chronic kidney disease), stage III   . Dyslipidemia   . Dyspnea on exertion   . GERD (gastroesophageal reflux disease)   . History of GI bleed   . History of kidney stones   . History of MI (myocardial infarction)    11-05-2005--  periprocedural MI  (cardiac  cath) per dr [name] note  . Hypertension   . Hypertriglyceridemia   . Iron deficiency anemia   . Persistent atrial fibrillation ([place])    a. 07/2016, started on Eliquis -> readmitted later that month for AF RVR/acute HF - initially unsuccessful DCCV following TEE, so loaded with amio with repeat successful DCCV 07/2816.  . Right ureteral stone   . S/P drug eluting coronary stent placement    x3  to LAD  2006   Past Surgical History:  Procedure Laterality Date  . CARDIOVASCULAR STRESS TEST  01-06-2013  dr [name]   normal perfusion study/  no ischemia or infarct/  normal LV function and wall motion , ef 57%  . CARDIOVERSION N/A 08/05/2016   Procedure: CARDIOVERSION;  Surgeon: Peter Klein, MD;  Location: [facility];  Service: Cardiovascular;  Laterality: N/A;  . CARDIOVERSION N/A 08/08/2016   Procedure: CARDIOVERSION;  Surgeon: Jerline Pain, MD;  Location: [facility] ENDOSCOPY;  Service: Cardiovascular;  Laterality: N/A;  . CORONARY ANGIOPLASTY  11-13-2005 dr [name]   Successful touch up post-dilatation of the 3 tandem overlying Cypher stents in proximal to mid LAD (based on IVUS 0% stenosis and patent diagonal branch)/  Cutting Balloon Angioplasty of Ramus branch  . CORONARY ANGIOPLASTY WITH STENT PLACEMENT  11-05-2005   dr [name]   PCI and DES x3 to birfurcation lesion LAD and diagonal branch of LAD/   20% dLM,  30-40% RCA, ef 60%  . CYSTOSCOPY WITH RETROGRADE PYELOGRAM, URETEROSCOPY AND STENT PLACEMENT  Right 07/14/2015   Procedure: CYSTOSCOPY WITH RETROGRADE PYELOGRAM, URETEROSCOPY AND STENT PLACEMENT;  Surgeon: Peter Frock, MD;  Location: [facility];  Service: Urology;  Laterality: Right;  . ELECTROPHYSIOLOGIC STUDY N/A 10/24/2016   Procedure: Atrial Fibrillation Ablation;  Surgeon: Peter Meredith Leeds, MD;  Location: [facility] CV LAB;  Service: Cardiovascular;  Laterality: N/A;  . EXTRACORPOREAL SHOCK WAVE LITHOTRIPSY  yrs ago  . IR GENERIC HISTORICAL  10/23/2016   IR  FLUORO GUIDE CV LINE RIGHT 10/23/2016 Peter Keen, MD [facility]INTER[facility]  . IR GENERIC HISTORICAL  10/23/2016   IR US GUIDE VASC ACCESS RIGHT 10/23/2016 Peter Keen, MD [facility]  . LEFT URETEROSCOPIC STONE EXTRACTION  04-10-2006  . STONE EXTRACTION WITH BASKET Right 07/14/2015   Procedure: STONE EXTRACTION WITH BASKET;  Surgeon: Peter Frock, MD;  Location: [facility];  Service: Urology;  Laterality: Right;  . TEE WITHOUT CARDIOVERSION N/A 08/05/2016   Procedure: TRANSESOPHAGEAL ECHOCARDIOGRAM (TEE);  Surgeon: Peter Klein, MD;  Location: [facility] ENDOSCOPY;  Service: Cardiovascular;  Laterality: N/A;  . TRANSTHORACIC ECHOCARDIOGRAM  02-04-2012   grade I diastolic dysfunction/  ef 55-60%/  trivial MR and TR/  mild LAE     Current Outpatient Prescriptions  Medication Sig Dispense Refill  . amiodarone (PACERONE) 200 MG tablet Take 200 mg by mouth daily.    Marland Kitchen atorvastatin (LIPITOR) 40 MG tablet TAKE 1 TABLET(40 MG) BY MOUTH EVERY MORNING 90 tablet 1  . carvedilol (COREG) 12.5 MG tablet Take 1 tablet (12.5 mg total) by mouth 2 (two) times daily with a meal. 180 tablet 3  . diphenhydramine-acetaminophen (TYLENOL PM) 25-500 MG TABS tablet Take 1 tablet by mouth at bedtime.    . ferrous sulfate 325 (65 FE) MG tablet Take 325 mg by mouth every morning.     . furosemide (LASIX) 40 MG tablet TAKE 1 TABLET(40 MG) BY MOUTH DAILY 30 tablet 11  . lisinopril (PRINIVIL,ZESTRIL) 40 MG tablet Take 1 tablet (40 mg total) by mouth daily. 90 tablet 3  . nitroGLYCERIN (NITROSTAT) 0.4 MG SL tablet Place 1 tablet (0.4 mg total) under the tongue every 5 (five) minutes as needed for chest pain. X 3 doses 25 tablet prn  . pantoprazole (PROTONIX) 40 MG tablet Take 40 mg by mouth daily.     . polyvinyl alcohol (LIQUIFILM TEARS) 1.4 % ophthalmic solution Place 1 drop into both eyes daily as needed for dry eyes.    . potassium chloride SA (K-DUR,KLOR-CON) 20 MEQ tablet TAKE 1 TABLET(20 MEQ) BY MOUTH DAILY  30 tablet 11  . rivaroxaban (XARELTO) 20 MG TABS tablet Take 1 tablet (20 mg total) by mouth daily with supper. 30 tablet 11   No current facility-administered medications for this visit.     Allergies:   Patient has no known allergies.   Social History:  The patient  reports that he has never smoked. He has never used smokeless tobacco. He reports that he does not drink alcohol or use drugs.   Family History:  The patient's family history includes Heart attack (age of onset: 45) in his father.    ROS:  Please see the history of present illness.   Otherwise, review of systems is positive for none.   All other systems are reviewed and negative.     PHYSICAL EXAM: VS:  BP (!) 150/98   Pulse 61   Ht 5\' 8"  (1.727 m)   Wt 233 lb (105.7 kg)   SpO2 97%   BMI 35.43 kg/m  ,  BMI Body mass index is 35.43 kg/m. GEN: Well nourished, well developed, in no acute distress  HEENT: normal  Neck: no JVD, carotid bruits, or masses Cardiac: RRR; no murmurs, rubs, or gallops,no edema  Respiratory:  clear to auscultation bilaterally, normal work of breathing GI: soft, nontender, nondistended, + BS MS: no deformity or atrophy  Skin: warm and dry Neuro:  Strength and sensation are intact Psych: euthymic mood, full affect  EKG:  EKG is ordered today. Personal review of the ekg ordered shows sinus rhythm, lateral Q waves, rate 61  Recent Labs: 07/17/2016: ALT 22 08/03/2016: B Natriuretic Peptide 414.8; TSH 0.871 08/05/2016: Magnesium 1.9 10/16/2016: BUN 25; Creat 1.72; Potassium 4.4; Sodium 142 10/25/2016: Hemoglobin 13.4; Platelets 166    Lipid Panel     Component Value Date/Time   CHOL 140 01/01/2013 1037   TRIG 230.0 (H) 01/01/2013 1037   HDL 38.20 (L) 01/01/2013 1037   CHOLHDL 4 01/01/2013 1037   VLDL 46.0 (H) 01/01/2013 1037   LDLDIRECT 68.6 01/01/2013 1037     Wt Readings from Last 3 Encounters:  04/29/17 233 lb (105.7 kg)  03/04/17 239 lb (108.4 kg)  02/24/17 232 lb 3.2 oz  (105.3 kg)      Other studies Reviewed: Additional studies/ records that were reviewed today include: TEE 08/05/16  Review of the above records today demonstrates:  - Left ventricle: The cavity size was normal. Systolic function was   moderately to severely reduced. The estimated ejection fraction   was in the range of 30% to 35%. Moderate diffuse hypokinesis with   no identifiable regional variations. No evidence of thrombus. - Aortic valve: There was trivial regurgitation. - Mitral valve: There was mild regurgitation directed centrally. - Left atrium: The atrium was severely dilated. No evidence of   thrombus in the atrial cavity or appendage.  There was   moderate continuous spontaneous echo contrast (&quot;smoke&quot;) in the   cavity and the appendage. - Atrial septum: No defect or patent foramen ovale was identified. - Tricuspid valve: There was moderate regurgitation directed   centrally.   ASSESSMENT AND PLAN:  1.  Persistent atrial fibrillation/flutter: Currently on Xarelto and amiodarone. Had ablation in December 2017. His remained in sinus rhythm since that time. Due to that, we Peter plan to stop his amiodarone today.  This patients CHA2DS2-VASc Score and unadjusted Ischemic Stroke Rate (% per year) is equal to 4.8 % stroke rate/year from a score of 4  Above score calculated as 1 point each if present [CHF, HTN, DM, Vascular=MI/PAD/Aortic Plaque, Age if 65-74, or Male] Above score calculated as 2 points each if present [Age > 75, or Stroke/TIA/TE]   2. Likely nonischemic cardiomyopathy: Ejection fraction is improved to 55-60% on echo in December 2018. Continue current management.  3. HTN: Elevated today. He Peter check his blood pressure at home and we Peter touch base in 2 weeks for possible medication management.  4. CAD s/p PCI to LAD in 2006: Currently asymptomatic. No changes at this time.   5. OSA: On BiPAP. Encouraged compliance  Current medicines are  reviewed at length with the patient today.   The patient does not have concerns regarding his medicines.  The following changes were made today:  Stop amiodarone  Labs/ tests ordered today include:  No orders of the defined types were placed in this encounter.    Disposition:   FU with Peter Camnitz 6 months  Signed, Peter Meredith Leeds, MD  04/29/2017 8:57 AM  [address] [place] [address] [phone] (office) [phone] (fax)

## 2017-04-29 NOTE — Telephone Encounter (Signed)
Pt came in for a Cardiology office visit  With Dr [name]. On his way out pt asked the check out receptionist that Dr Marlou Porch had recommended pt to take fish oil capsule  4 mg daily and he has not been able to find that dose in the pharmacy. When reviewing telephone message , on may 21 st [name] nurse called pt to let him know that [name] recommend for pt to take 4 grams of fish oil capsules daily. Pt is aware he verbalized understanding.

## 2017-04-29 NOTE — Patient Instructions (Signed)
Medication Instructions:    Your physician has recommended you make the following change in your medication: 1) STOP Amiodarone  - If you need a refill on your cardiac medications before your next appointment, please call your pharmacy.   Labwork:  None ordered  Testing/Procedures:  None ordered  Follow-Up:  Your physician wants you to follow-up in: 6 months with Dr. [name].  You will receive a reminder letter in the mail two months in advance. If you don't receive a letter, please call our office to schedule the follow-up appointment.  Thank you for choosing [facility]!!   Trinidad Curet, RN [phone]  Any Other Special Instructions Will Be Listed Below (If Applicable). Please keep track of your blood pressures over the next several weeks.  Claus Silvestro, RN will call you in several weeks to check on your blood pressure.

## 2017-04-30 ENCOUNTER — Ambulatory Visit (INDEPENDENT_AMBULATORY_CARE_PROVIDER_SITE_OTHER): Payer: Medicare Other | Admitting: Nurse Practitioner

## 2017-04-30 ENCOUNTER — Telehealth: Payer: Self-pay | Admitting: *Deleted

## 2017-04-30 VITALS — BP 164/100 | HR 64 | Ht 68.0 in | Wt 233.0 lb

## 2017-04-30 DIAGNOSIS — I10 Essential (primary) hypertension: Secondary | ICD-10-CM | POA: Diagnosis not present

## 2017-04-30 MED ORDER — SPIRONOLACTONE 25 MG PO TABS: 25.0000 mg | ORAL_TABLET | Freq: Every day | ORAL | 6 refills | Status: DC

## 2017-04-30 MED ORDER — CARVEDILOL 25 MG PO TABS: 25.0000 mg | ORAL_TABLET | Freq: Two times a day (BID) | ORAL | 6 refills | Status: DC

## 2017-04-30 NOTE — Patient Instructions (Signed)
Your physician will call you with plan of care

## 2017-04-30 NOTE — Progress Notes (Signed)
1.) Reason for visit: patient walked in with request for management of elevated BP  2.) Name of MD requesting visit: patient saw Dr. [name] yesterday and was advised to monitor BP for 2 weeks and call back to report  3.) H&P: recent onset elevated BP  4.) ROS related to problem: patient states he has not been feeling well recently; took BP at home and got readings of 200/100, went to [facility] to have BP by former co-workers and was advised to come here for an appointment  Readings at [facility] were: Rt arm 197/104 mmHg,  Lt arm 194/114 mmHg  5.) Assessment and plan per MD: I called Trinidad Curet, RN in our [facility] office where Dr. [name] is seeing patients this afternoon. She advised that she will review with Dr. [name] and call patient later today with advice  Patient verbalized understanding and agreement  and ambulated out of office in NAD

## 2017-04-30 NOTE — Telephone Encounter (Signed)
Swinyer, Lanice Schwab, RN at 04/30/2017 1:00 PM   Status: Signed    1. Reason for visit: patient walked in with request for management of elevated BP  2. Name of MD requesting visit: patient saw Dr. [name] yesterday and was advised to monitor BP for 2 weeks and call back to report  3. H&P: recent onset elevated BP  4. ROS related to problem: patient states he has not been feeling well recently; took BP at home and got readings of 200/100, went to [facility] to have BP by former co-workers and was advised to come here for an appointment             Readings at [facility] were: Rt arm 197/104 mmHg,         Lt arm 194/114 mmHg  5. Assessment and plan per MD: I called Trinidad Curet, RN in our [facility] office where Dr. [name] is seeing patients this afternoon. She advised that she will review with Dr. [name] and call patient later today with advice             Patient verbalized understanding and agreement     and ambulated out of office in NAD

## 2017-04-30 NOTE — Telephone Encounter (Signed)
Per Dr. [name]: Pt advised to increase Carvedilol to 25 mg BID and start Aldactone 25 mg daily. Advised to follow up with [name] if pressure remains an issue after starting medications. Patient verbalized understanding and agreeable to plan.

## 2017-05-15 ENCOUNTER — Other Ambulatory Visit: Payer: Self-pay | Admitting: *Deleted

## 2017-05-15 MED ORDER — FUROSEMIDE 40 MG PO TABS: ORAL_TABLET | ORAL | 3 refills | Status: DC

## 2017-05-15 MED ORDER — POTASSIUM CHLORIDE CRYS ER 20 MEQ PO TBCR: EXTENDED_RELEASE_TABLET | ORAL | 3 refills | Status: DC

## 2017-05-19 ENCOUNTER — Other Ambulatory Visit: Payer: Self-pay | Admitting: Cardiology

## 2017-05-21 ENCOUNTER — Telehealth: Payer: Self-pay | Admitting: *Deleted

## 2017-05-21 NOTE — Telephone Encounter (Signed)
Patient left a msg on the refill vm stating that he is unable to afford the xarelto at $400+/mo. He requested that Dr [name] switch him to something more affordable. Patient can be reached at [phone]. Thanks, MI

## 2017-05-23 NOTE — Telephone Encounter (Signed)
**Note De-Identified Jamieson Hetland Obfuscation** The pt states that Xarelto will cost him close to $200 for a 30 day supply. The pt is advised that I will do a tier exception to try to lower the cost of Xarelto. He states that he has a 30 day supply of Xarelto at this time.

## 2017-05-23 NOTE — Telephone Encounter (Signed)
Forwarding to prior auth dept to address w/ patient

## 2017-05-27 NOTE — Telephone Encounter (Signed)
I have done a tier exception on Xarelto through covermymeds. Awaiting response.

## 2017-05-28 NOTE — Telephone Encounter (Addendum)
**Note De-Identified Stevon Gough Obfuscation** Received fax stating that the Xarelto PA has been approved through [employer]. Approval good from 05/27/2017 until 11/10/2017.

## 2017-06-02 DIAGNOSIS — C44619 Basal cell carcinoma of skin of left upper limb, including shoulder: Secondary | ICD-10-CM | POA: Diagnosis not present

## 2017-06-02 DIAGNOSIS — Z85828 Personal history of other malignant neoplasm of skin: Secondary | ICD-10-CM | POA: Diagnosis not present

## 2017-06-11 DIAGNOSIS — L570 Actinic keratosis: Secondary | ICD-10-CM | POA: Diagnosis not present

## 2017-06-24 DIAGNOSIS — N183 Chronic kidney disease, stage 3 (moderate): Secondary | ICD-10-CM | POA: Diagnosis not present

## 2017-08-10 ENCOUNTER — Other Ambulatory Visit: Payer: Self-pay | Admitting: Physician Assistant

## 2017-08-11 NOTE — Telephone Encounter (Signed)
Medication Detail    Disp Refills Start End   furosemide (LASIX) 40 MG tablet 90 tablet 3 05/15/2017    Sig: TAKE 1 TABLET(40 MG) BY MOUTH DAILY   Sent to pharmacy as: furosemide (LASIX) 40 MG tablet   E-Prescribing Status: Receipt confirmed by pharmacy (05/15/2017 1:46 PM EDT)   Pharmacy   [id number] - [place], [address] AT [address]

## 2017-08-19 ENCOUNTER — Other Ambulatory Visit: Payer: Self-pay | Admitting: Physician Assistant

## 2017-09-16 ENCOUNTER — Telehealth: Payer: Self-pay | Admitting: Cardiology

## 2017-09-16 NOTE — Telephone Encounter (Signed)
New message    Patient wants to know if he can stop taking one these medications.   Pt c/o medication issue:  1. Name of Medication: furosemide (LASIX) 40 MG tablet and spironolactone (ALDACTONE) 25 MG tablet  2. How are you currently taking this medication (dosage and times per day)? As prescribed  3. Are you having a reaction (difficulty breathing--STAT)? NO  4. What is your medication issue? Patient feels he should not be taking both medications, because they work the same.

## 2017-09-17 ENCOUNTER — Other Ambulatory Visit: Payer: Self-pay | Admitting: Cardiology

## 2017-09-17 ENCOUNTER — Other Ambulatory Visit: Payer: Self-pay | Admitting: Physician Assistant

## 2017-09-17 NOTE — Telephone Encounter (Signed)
Medication Detail    Disp Refills Start End   potassium chloride SA (K-DUR,KLOR-CON) 20 MEQ tablet 90 tablet 3 05/15/2017    Sig: TAKE 1 TABLET(20 MEQ) BY MOUTH DAILY   Sent to pharmacy as: potassium chloride SA (K-DUR,KLOR-CON) 20 MEQ tablet   E-Prescribing Status: Receipt confirmed by pharmacy (05/15/2017 1:46 PM EDT)   Pharmacy   [id number] - [place], [address] AT [address] [place]

## 2017-09-18 NOTE — Telephone Encounter (Signed)
Advised to stop Aldactone, per Dr. [name].   Advised to monitor BPs and call office if they begin to remain elevated (>130-140s)  Pt agreeable to plan.

## 2017-09-18 NOTE — Telephone Encounter (Signed)
Follow up     Pt is returning your call about his medication

## 2017-09-18 NOTE — Telephone Encounter (Signed)
Pt reports 10-12 pds of "weight gain" since starting Spironolactone in June. States that he "looked it up on the internet" and it said a "side effect is severe weight gain". Reports having no energy and can walk the dog around the block and be tired and have to sit for 15 minutes minimum. Reported BP WNLs. Will discuss w/ Camnitz and call him back today/tomorrow. Pt is agreeable.

## 2017-09-22 DIAGNOSIS — K648 Other hemorrhoids: Secondary | ICD-10-CM | POA: Diagnosis not present

## 2017-10-13 ENCOUNTER — Ambulatory Visit: Payer: Self-pay | Admitting: Surgery

## 2017-10-13 NOTE — H&P (Signed)
History of Present Illness Peter Gave M. 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 Denies any real anorectal pain/discomfort.Happens with most BMs. Tissue typically self reduces. Denies prior anorectal procedures.  Does not take a daily fiber supplement. Last colonoscopy was 4 years ago and was normal per the patient. PMH: HTN, HLD, Afib (on xarelto) PSH: No prior anorectal surgeries Social: Denies use of tobacco/EtOH/drugs FHx: Denies FHx of malignancy ROS: A comprehensive 10 system review of systems was completed and pertinent findings noted above.  The patient is a 71 year old male.   Past Surgical History Peter Lorenzo, LPN; 34/28/7681 1:57 PM) Colon Polyp Removal - Colonoscopy   Open Inguinal Hernia Surgery   Bilateral. Vasectomy    Diagnostic Studies History Peter Lorenzo, LPN; 26/20/3559 7:41 PM) Colonoscopy   5-10 years ago  Allergies Peter Lorenzo, LPN; 63/84/5364 6:80 PM) No Known Drug Allergies  09/22/2017  Medication History Peter Lorenzo, LPN; 32/10/2481 5:00 PM) Atorvastatin Calcium  (40MG  Tablet, Oral) Active. Carvedilol  (25MG  Tablet, Oral) Active. Ferrous Sulfate  (325 (65 Fe)MG Tablet, Oral) Active. Furosemide  (40MG  Tablet, Oral) Active. Lisinopril  (40MG  Tablet, Oral) Active. Nitroglycerin  (0.4MG  Tab Sublingual, Sublingual) Active. Pantoprazole Sodium  (40MG  Tablet DR, Oral) Active. Potassium Chloride Crys ER  (20MEQ Tablet ER, Oral) Active. Xarelto  (20MG  Tablet, Oral) Active. Tylenol PM Extra Strength  (500-25MG  Tablet, Oral) Active. Nitrostat  (0.4MG  Tab Sublingual, Sublingual) Active. Liquifilm Tears  (1.4% Solution, Ophthalmic) Active. Medications Reconciled   Social History Peter Lorenzo, LPN; 37/02/8888 1:69 PM) Alcohol use   Occasional  alcohol use. Caffeine use   Carbonated beverages, Tea. No drug use   Tobacco use   Never smoker.  Family History Peter Lorenzo, LPN; 45/01/8881 8:00 PM) Heart Disease   Father. Hypertension   Father.  Other Problems Peter Lorenzo, LPN; 34/91/7915 0:56 PM) Atrial Fibrillation   Back Pain   Chest pain   Congestive Heart Failure   Gastroesophageal Reflux Disease   Hemorrhoids   High blood pressure   Hypercholesterolemia   Inguinal Hernia   Kidney Stone   Myocardial infarction   Sleep Apnea      Review of Systems Peter [name]; 09/22/2017 2:27 PM) General Present- Fatigue. Not Present- Appetite Loss, Chills, Fever, Night Sweats, Weight Gain and Weight Loss. HEENT Present- Wears glasses/contact lenses. Not Present- Earache, Hearing Loss, Hoarseness, Nose Bleed, Oral Ulcers, Ringing in the Ears, Seasonal Allergies, Sinus Pain, Sore Throat, Visual Disturbances and Yellow Eyes. Respiratory Present- Snoring. Not Present- Bloody sputum, Chronic Cough, Difficulty Breathing and Wheezing. Cardiovascular Present- Chest Pain, Rapid Heart Rate and Shortness of Breath. Not Present- Difficulty Breathing Lying Down, Leg Cramps, Palpitations and Swelling of Extremities. Gastrointestinal Present- Hemorrhoids. Not Present- Abdominal Pain, Bloating, Bloody Stool, Change in Bowel Habits, Chronic diarrhea, Constipation, Difficulty Swallowing, Excessive gas, Gets full quickly at meals, Indigestion, Nausea, Rectal Pain and Vomiting. Musculoskeletal Present- Back Pain. Not Present- Joint Pain, Joint Stiffness, Muscle Pain, Muscle Weakness and Swelling of Extremities. Neurological Not Present- Decreased Memory and Difficulty Speaking. Psychiatric Not Present- Anxiety and Depression. Endocrine Not Present- Appetite Changes and Cold Intolerance. Hematology Present- Blood Thinners. Not Present- Easy Bruising, Excessive bleeding, Gland problems, HIV and Persistent Infections.  Vitals Peter Billings Dockery  LPN; 97/94/8016 5:53 PM) 09/22/2017 1:44 PM Weight: 236.4 lb   Height: 68  in  Body Surface Area: 2.19 m   Body Mass Index: 35.94 kg/m   Temp.: 97.8 F (Temporal)    Pulse: 84 (Regular)    BP: 136/82 (Sitting, Left Arm, Standard)       Physical Exam Peter [name]; 09/22/2017 2:28 PM) The physical exam findings are as follows: Note: Constitutional: No acute distress, conversant, no deformities Eyes: Moist conjunctiva; no lid lag; anicteric; pupils equal round and reactive to light Neck: Trachea midline; no palpable thyromegaly Lungs: Normal respiratory effort; no tactile fremitus CV: Regular rate and rhythm; no palpable thrill; no pitting edema GI: Abdomen soft, nontender, nondistended; no palpable hepatosplenomegaly Anorectal: No external skin lesions. DRE - no palpable masses. Good tone. Anoscopy: Right anterior and posterior internal hemorrhoids; no ulceration or lesions noted. MSK: Normal gait; no clubbing/cyanosis Psych: Appropriate affect; alert and oriented 3 Lymphatic: No palpable cervical or axillary lymphadenopathy    Assessment & Plan Peter [name]; 09/22/2017 2:30 PM) INTERNAL HEMORRHOID, BLEEDING (K64.8) Impression: -We discussed the anatomy and physiology of the anal canal and pathophysiology of hemorrhoids -I recommend he start a daily fiber supplement - metamucil/citracel/fibercon -Additionally, I recommend he start drinking 64oz of water per day -Minimize time on commode to 2-3 minutes -Will schedule for EUA + banding of hemorrhoids. He will need cardiac clearance prior and to be off Xarelto.  Signed electronically by Ileana Roup, [name] (09/22/2017 2:30 PM)

## 2017-10-14 DIAGNOSIS — C44519 Basal cell carcinoma of skin of other part of trunk: Secondary | ICD-10-CM | POA: Diagnosis not present

## 2017-10-14 DIAGNOSIS — L570 Actinic keratosis: Secondary | ICD-10-CM | POA: Diagnosis not present

## 2017-10-14 DIAGNOSIS — D485 Neoplasm of uncertain behavior of skin: Secondary | ICD-10-CM | POA: Diagnosis not present

## 2017-10-14 DIAGNOSIS — Z85828 Personal history of other malignant neoplasm of skin: Secondary | ICD-10-CM | POA: Diagnosis not present

## 2017-10-16 NOTE — Pre-Procedure Instructions (Signed)
Peter [name]  10/16/2017      [facility] [address] AT [facility] [address] [place] [address] Phone: [phone] Fax: [phone]    Your procedure is scheduled on October 20, 2017.  Report to [facility] Admitting at [phone] A.M.  Call this number if you have problems the morning of surgery:  [phone]   Remember:  Do not eat food or drink liquids after midnight.  Take these medicines the morning of surgery with A SIP OF WATER Carvedilol (Coreg), Pantoprazole (Protonix).   STOP/TAKE Xarelto as directed by your doctor.   7 days prior to surgery STOP taking any Aspirin(unless otherwise instructed by your surgeon), Aleve, Naproxen, Ibuprofen, Motrin, Advil, Goody's, BC's, all herbal medications, fish oil, and all vitamins   Do not wear jewelry.  Do not wear lotions, powders, cologne, or deodorant.  Men may shave face and neck.  Do not bring valuables to the hospital.  [facility] is not responsible for any belongings or valuables.   [place]- Preparing For Surgery  Before surgery, you can play an important role. Because skin is not sterile, your skin needs to be as free of germs as possible. You can reduce the number of germs on your skin by washing with CHG (chlorahexidine gluconate) Soap before surgery.  CHG is an antiseptic cleaner which kills germs and bonds with the skin to continue killing germs even after washing.  Please do not use if you have an allergy to CHG or antibacterial soaps. If your skin becomes reddened/irritated stop using the CHG.  Do not shave (including legs and underarms) for at least 48 hours prior to first CHG shower. It is OK to shave your face.  Please follow these instructions carefully.   1. Shower the NIGHT BEFORE SURGERY and the MORNING OF SURGERY with CHG.   2. If you chose to wash your hair, wash your hair first as usual with your normal  shampoo.  3. After you shampoo, rinse your hair and body thoroughly to remove the shampoo.  4. Use CHG as you would any other liquid soap. You can apply CHG directly to the skin and wash gently with a scrungie or a clean washcloth.   5. Apply the CHG Soap to your body ONLY FROM THE NECK DOWN.  Do not use on open wounds or open sores. Avoid contact with your eyes, ears, mouth and genitals (private parts). Wash Face and genitals (private parts)  with your normal soap.  6. Wash thoroughly, paying special attention to the area where your surgery will be performed.  7. Thoroughly rinse your body with warm water from the neck down.  8. DO NOT shower/wash with your normal soap after using and rinsing off the CHG Soap.  9. Pat yourself dry with a CLEAN TOWEL.  10. Wear CLEAN PAJAMAS to bed the night before surgery, wear comfortable clothes the morning of surgery  11. Place CLEAN SHEETS on your bed the night of your first shower and DO NOT SLEEP WITH PETS.   Day of Surgery: Do not apply any deodorants/lotions. Please wear clean clothes to the hospital/surgery center.    Contacts, dentures or bridgework may not be worn into surgery.  Leave your suitcase in the car.  After surgery it may be brought to your room.  For patients admitted to the hospital, discharge time will be determined by your treatment team.  Patients discharged the day of surgery will not be allowed to drive home.    Please read over the following fact sheets that you were given. Pain Booklet

## 2017-10-17 ENCOUNTER — Encounter (HOSPITAL_COMMUNITY)
Admission: RE | Admit: 2017-10-17 | Discharge: 2017-10-17 | Disposition: A | Discharge status: Home / Self Care | Payer: Medicare Other | Source: Ambulatory Visit | Attending: Surgery | Admitting: Surgery

## 2017-10-17 ENCOUNTER — Other Ambulatory Visit: Payer: Self-pay

## 2017-10-17 ENCOUNTER — Encounter (HOSPITAL_COMMUNITY): Payer: Self-pay | Admitting: Anesthesiology

## 2017-10-17 ENCOUNTER — Encounter (HOSPITAL_COMMUNITY): Payer: Self-pay

## 2017-10-17 ENCOUNTER — Encounter (HOSPITAL_COMMUNITY): Payer: Self-pay | Admitting: Emergency Medicine

## 2017-10-17 DIAGNOSIS — G4733 Obstructive sleep apnea (adult) (pediatric): Secondary | ICD-10-CM | POA: Diagnosis not present

## 2017-10-17 DIAGNOSIS — D509 Iron deficiency anemia, unspecified: Secondary | ICD-10-CM | POA: Diagnosis not present

## 2017-10-17 DIAGNOSIS — I10 Essential (primary) hypertension: Secondary | ICD-10-CM | POA: Diagnosis not present

## 2017-10-17 DIAGNOSIS — K649 Unspecified hemorrhoids: Secondary | ICD-10-CM | POA: Diagnosis not present

## 2017-10-17 DIAGNOSIS — E785 Hyperlipidemia, unspecified: Secondary | ICD-10-CM | POA: Insufficient documentation

## 2017-10-17 DIAGNOSIS — I4891 Unspecified atrial fibrillation: Secondary | ICD-10-CM | POA: Diagnosis not present

## 2017-10-17 DIAGNOSIS — I2510 Atherosclerotic heart disease of native coronary artery without angina pectoris: Secondary | ICD-10-CM | POA: Insufficient documentation

## 2017-10-17 DIAGNOSIS — N183 Chronic kidney disease, stage 3 (moderate): Secondary | ICD-10-CM | POA: Insufficient documentation

## 2017-10-17 DIAGNOSIS — Z01812 Encounter for preprocedural laboratory examination: Secondary | ICD-10-CM | POA: Insufficient documentation

## 2017-10-17 DIAGNOSIS — K219 Gastro-esophageal reflux disease without esophagitis: Secondary | ICD-10-CM | POA: Diagnosis not present

## 2017-10-17 DIAGNOSIS — Z79899 Other long term (current) drug therapy: Secondary | ICD-10-CM | POA: Insufficient documentation

## 2017-10-17 DIAGNOSIS — I130 Hypertensive heart and chronic kidney disease with heart failure and stage 1 through stage 4 chronic kidney disease, or unspecified chronic kidney disease: Secondary | ICD-10-CM | POA: Insufficient documentation

## 2017-10-17 DIAGNOSIS — I5042 Chronic combined systolic (congestive) and diastolic (congestive) heart failure: Secondary | ICD-10-CM | POA: Insufficient documentation

## 2017-10-17 HISTORY — DX: Acute myocardial infarction, unspecified: I21.9

## 2017-10-17 HISTORY — DX: Dyspnea, unspecified: R06.00

## 2017-10-17 HISTORY — DX: Sleep apnea, unspecified: G47.30

## 2017-10-17 LAB — CBC
HEMATOCRIT: 36.2 % — AB (ref 39.0–52.0)
HEMOGLOBIN: 11.9 g/dL — AB (ref 13.0–17.0)
MCH: 32.3 pg (ref 26.0–34.0)
MCHC: 32.9 g/dL (ref 30.0–36.0)
MCV: 98.4 fL (ref 78.0–100.0)
Platelets: 143 10*3/uL — ABNORMAL LOW (ref 150–400)
RBC: 3.68 MIL/uL — AB (ref 4.22–5.81)
RDW: 13.4 % (ref 11.5–15.5)
WBC: 6.5 10*3/uL (ref 4.0–10.5)

## 2017-10-17 LAB — BASIC METABOLIC PANEL
ANION GAP: 9 (ref 5–15)
BUN: 16 mg/dL (ref 6–20)
CALCIUM: 9.3 mg/dL (ref 8.9–10.3)
CHLORIDE: 111 mmol/L (ref 101–111)
CO2: 22 mmol/L (ref 22–32)
Creatinine, Ser: 1.34 mg/dL — ABNORMAL HIGH (ref 0.61–1.24)
GFR calc non Af Amer: 51 mL/min — ABNORMAL LOW (ref >60)
GFR, EST AFRICAN AMERICAN: 59 mL/min — AB (ref >60)
Glucose, Bld: 130 mg/dL — ABNORMAL HIGH (ref 65–99)
POTASSIUM: 4.2 mmol/L (ref 3.5–5.1)
Sodium: 142 mmol/L (ref 135–145)

## 2017-10-17 MED ORDER — CEFAZOLIN SODIUM-DEXTROSE 2-4 GM/100ML-% IV SOLN: 2.0000 g | INTRAVENOUS | Status: DC

## 2017-10-17 NOTE — Progress Notes (Signed)
PCP - [name] Cardiologist - [name] and Dr. [name]  Chest x-ray - N/A EKG - 04/29/17 Stress Test - 01/06/13 ECHO - 11/22/16 Cardiac Cath - 2006- see report in notes section on 03/26/11  Sleep Study - 10/18/16, pt wears BIPAP at night settings start at 14 and increase to 20 (no higher than 24)  Blood Thinner Instructions: Pt last dose of Xarelto to be tonight 10/17/17. Pt states he was told he can start his Xarelto back on 10/27/17 per Dr. [name] (pt states Dr. Orest Dikes office called Dr. [name] for these instructions)  Anesthesia review: Hx CAD, per Dr. Orest Dikes note pt needs cardiac clearance prior to surgery. Pt states he did not have to see Dr. [name] or [name] prior to surgery, but he will see Dr. [name] on the 24th of December. Pt reports no cardiac issues recently, no chest pain since 2006. Pt reports since he was diagnosed with Afib and CHF he always gets tired and short of breath with much exertion. Pt states this is now normal for him and he is at his baseline.   Patient denies shortness of breath, fever, cough and chest pain at PAT appointment   Patient verbalized understanding of instructions that were given to them at the PAT appointment. Patient was also instructed that they will need to review over the PAT instructions again at home before surgery.

## 2017-10-17 NOTE — Progress Notes (Signed)
Anesthesia Chart Review:  Pt is a 71 year old male scheduled for hemorrhoidectomy on 10/20/2017 with Nadeen Landau, MD  - PCP is [name] Dorthy Cooler, MD - Cardiologisty is Candee Furbish, MD last office visit 03/04/17 - EP cardiologist is Allegra Lai, MD last office visit 04/29/17  PMH includes:  CAD (DES 3 to LAD with bifurcation lesion 2006), atrial fibrillation (s/p ablation 10/24/16), chronic combined systolic and diastolic CHF, HTN, dyslipidemia, CKD (stage III), OSA, iron deficiency anemia, GERD. Never smoker. BMI 37.  Medications include: Lipitor, carvedilol, iron, Lasix, lisinopril, Protonix, potassium, xarelto. Pt to hold xarelto 2 days before surgery.   Preoperative labs reviewed.   - PT/INR will be obtained day of surgery  EKG 04/29/17: NSR. Possible lateral infarct, age undetermined.   Echo 11/22/16:  - Left ventricle: The cavity size was normal. Wall thickness was increased in a pattern of mild LVH. Systolic function was normal. The estimated ejection fraction was in the range of 55% to 60%. Left ventricular diastolic function parameters were normal. - Mitral valve: Calcified annulus. Mildly thickened leaflets . - Left atrium: The atrium was mildly dilated. - Atrial septum: No defect or patent foramen ovale was identified.  CT cardiac morphology 10/23/16:  1) No LAA thrombus 2) Normal pulmonary veins with no anomaly measurements as above 3) Moderate biatrial enlargement 4) No pericardial effusion 5) Esophagus courses closest to the LLPV ostium  Nuclear stress test 01/06/13:  - Normal stress nuclear study. - LV Ejection Fraction: 57%.  LV Wall Motion:  NL LV Function; NL Wall Motion  Cardiac cath 11/05/05:  - LM: 20% distal stenosis - LAD: 95% discrete stenosis at the takeoff of D2; origin of D2 was involved. Distal vessel normal. 30-40% disease in intermediate branch - CX: 80% lesion in the fairly large ramus branch of the circumflex artery.  - RCA: catheter spasms. Distal  vessel had 30-40% tubular disease. RCA dominant - S/p PCI of the bifurcation lesion of the left anterior descending and diagonal branch of the left anterior descending with improvement in the left anterior descending stenosis from 90% to less than 10% but residual 90% stenosis in the diagonal branch with TIMI-I flow.  The procedure was complicated by transient occlusion of the left anterior descending requiring an additional, (third), stent. In spite the difficulty of the procedure, he will probably need intervention of the ramus branch at some later time.  If labs acceptable day of surgery, I anticipate pt can proceed with surgery as scheduled.   Willeen Cass, FNP-BC [facility]/Anesthesiology Phone: [phone] 10/17/2017 4:10 PM

## 2017-10-19 NOTE — Anesthesia Preprocedure Evaluation (Deleted)
Anesthesia Evaluation    Reviewed: Allergy & Precautions, Patient's Chart, lab work & pertinent test results  Airway        Dental   Pulmonary shortness of breath, sleep apnea and Continuous Positive Airway Pressure Ventilation ,           Cardiovascular hypertension, Pt. on home beta blockers and Pt. on medications + CAD, + Past MI, + Cardiac Stents, +CHF and + DOE       Neuro/Psych negative neurological ROS  negative psych ROS   GI/Hepatic Neg liver ROS, GERD  Medicated,  Endo/Other  negative endocrine ROSObesity  Renal/GU Renal InsufficiencyRenal disease     Musculoskeletal negative musculoskeletal ROS (+)   Abdominal   Peds  Hematology  (+) Blood dyscrasia (Xarelto), anemia ,   Anesthesia Other Findings Day of surgery medications reviewed with the patient.  Reproductive/Obstetrics                             Anesthesia Physical Anesthesia Plan  ASA: III  Anesthesia Plan: General   Post-op Pain Management:    Induction: Intravenous  PONV Risk Score and Plan: 2 and Dexamethasone and Ondansetron  Airway Management Planned: Oral ETT  Additional Equipment:   Intra-op Plan:   Post-operative Plan: Extubation in OR  Informed Consent:   Plan Discussed with: CRNA  Anesthesia Plan Comments:         Anesthesia Quick Evaluation                                   Anesthesia Evaluation  Patient identified by MRN, date of birth, ID band Patient awake    Reviewed: Allergy & Precautions, NPO status , Patient's Chart, lab work & pertinent test results  History of Anesthesia Complications Negative for: history of anesthetic complications  Airway Mallampati: II  TM Distance: <3 FB Neck ROM: Full    Dental  (+) Teeth Intact, Dental Advisory Given   Pulmonary neg pulmonary ROS,    breath sounds clear to auscultation       Cardiovascular hypertension, + CAD, +  Past MI, +CHF and + DOE   Rhythm:Irregular Rate:Tachycardia     Neuro/Psych negative neurological ROS     GI/Hepatic GERD  ,  Endo/Other    Renal/GU Renal disease     Musculoskeletal   Abdominal (+) + obese,   Peds  Hematology   Anesthesia Other Findings   Reproductive/Obstetrics                             Anesthesia Physical Anesthesia Plan  ASA: III  Anesthesia Plan: General   Post-op Pain Management:    Induction: Intravenous  Airway Management Planned: Oral ETT  Additional Equipment:   Intra-op Plan:   Post-operative Plan: Extubation in OR  Informed Consent:   Plan Discussed with: CRNA  Anesthesia Plan Comments:         Anesthesia Quick Evaluation

## 2017-10-20 ENCOUNTER — Telehealth: Payer: Self-pay | Admitting: Cardiology

## 2017-10-20 ENCOUNTER — Ambulatory Visit (HOSPITAL_COMMUNITY): Admission: RE | Admit: 2017-10-20 | Payer: Medicare Other | Source: Ambulatory Visit | Admitting: Surgery

## 2017-10-20 ENCOUNTER — Encounter (HOSPITAL_COMMUNITY): Admission: RE | Payer: Self-pay | Source: Ambulatory Visit

## 2017-10-20 SURGERY — HEMORRHOIDECTOMY
Anesthesia: Choice

## 2017-10-20 NOTE — Telephone Encounter (Signed)
-----   Message from Orie Rout sent at 10/17/2017  3:02 PM EST ----- Regarding: Cardiac Clearance Hello Dr. [name],  I'm Dr. Berneice Heinrich assistant and I am contacting you about Mr. Chong procedure on Monday with Dr. Dema Severin. Levada Dy with Short Stay is calling to see if he received cardiac clearance. Per your conversation with Dr. Dema Severin, these are the instructions:  HOLD XARELTO 2 DAYS PRIOR TO SX, RESTART AGAIN AFTER 1 WEEK.   Would you please do an Epic note that states these instructions so everyone has this information? Thanks, Alean Rinne, RMA

## 2017-10-21 ENCOUNTER — Ambulatory Visit: Payer: Self-pay | Admitting: Surgery

## 2017-10-22 ENCOUNTER — Encounter: Payer: Self-pay | Admitting: Cardiology

## 2017-10-22 NOTE — Progress Notes (Signed)
Peter [name] is a 71 y.o. male with a history of atrial fibrillation, planned to have hemorrhoidectomy. He is on anticoagulation with Xarelto and Rhian Asebedo need to be off anticoagulation for this procedure. Would plan to hold for 2 days prior to the procedure, would restart as soon as deemed medically stable by his surgeon.   Allegra Lai, MD

## 2017-10-24 ENCOUNTER — Ambulatory Visit (HOSPITAL_COMMUNITY)
Admission: RE | Admit: 2017-10-24 | Discharge: 2017-10-24 | Disposition: A | Discharge status: Home / Self Care | Payer: Medicare Other | Source: Ambulatory Visit | Attending: Surgery | Admitting: Surgery

## 2017-10-24 ENCOUNTER — Ambulatory Visit (HOSPITAL_COMMUNITY): Payer: Medicare Other | Admitting: Anesthesiology

## 2017-10-24 ENCOUNTER — Encounter (HOSPITAL_COMMUNITY)
Admission: RE | Disposition: A | Discharge status: Home / Self Care | Payer: Self-pay | Source: Ambulatory Visit | Attending: Surgery

## 2017-10-24 ENCOUNTER — Encounter (HOSPITAL_COMMUNITY): Payer: Self-pay | Admitting: *Deleted

## 2017-10-24 DIAGNOSIS — Z7901 Long term (current) use of anticoagulants: Secondary | ICD-10-CM | POA: Insufficient documentation

## 2017-10-24 DIAGNOSIS — I110 Hypertensive heart disease with heart failure: Secondary | ICD-10-CM | POA: Insufficient documentation

## 2017-10-24 DIAGNOSIS — K649 Unspecified hemorrhoids: Secondary | ICD-10-CM | POA: Diagnosis not present

## 2017-10-24 DIAGNOSIS — K219 Gastro-esophageal reflux disease without esophagitis: Secondary | ICD-10-CM | POA: Diagnosis not present

## 2017-10-24 DIAGNOSIS — G4730 Sleep apnea, unspecified: Secondary | ICD-10-CM | POA: Insufficient documentation

## 2017-10-24 DIAGNOSIS — D500 Iron deficiency anemia secondary to blood loss (chronic): Secondary | ICD-10-CM | POA: Diagnosis not present

## 2017-10-24 DIAGNOSIS — K644 Residual hemorrhoidal skin tags: Secondary | ICD-10-CM | POA: Diagnosis not present

## 2017-10-24 DIAGNOSIS — I5021 Acute systolic (congestive) heart failure: Secondary | ICD-10-CM | POA: Diagnosis not present

## 2017-10-24 DIAGNOSIS — I509 Heart failure, unspecified: Secondary | ICD-10-CM | POA: Insufficient documentation

## 2017-10-24 DIAGNOSIS — I4891 Unspecified atrial fibrillation: Secondary | ICD-10-CM | POA: Insufficient documentation

## 2017-10-24 DIAGNOSIS — K625 Hemorrhage of anus and rectum: Secondary | ICD-10-CM | POA: Diagnosis present

## 2017-10-24 DIAGNOSIS — I2510 Atherosclerotic heart disease of native coronary artery without angina pectoris: Secondary | ICD-10-CM | POA: Diagnosis not present

## 2017-10-24 DIAGNOSIS — I252 Old myocardial infarction: Secondary | ICD-10-CM | POA: Insufficient documentation

## 2017-10-24 DIAGNOSIS — Z79899 Other long term (current) drug therapy: Secondary | ICD-10-CM | POA: Diagnosis not present

## 2017-10-24 DIAGNOSIS — D649 Anemia, unspecified: Secondary | ICD-10-CM | POA: Insufficient documentation

## 2017-10-24 DIAGNOSIS — K648 Other hemorrhoids: Secondary | ICD-10-CM | POA: Diagnosis not present

## 2017-10-24 DIAGNOSIS — I10 Essential (primary) hypertension: Secondary | ICD-10-CM | POA: Diagnosis not present

## 2017-10-24 DIAGNOSIS — E7800 Pure hypercholesterolemia, unspecified: Secondary | ICD-10-CM | POA: Insufficient documentation

## 2017-10-24 HISTORY — PX: HEMORRHOID SURGERY: SHX153

## 2017-10-24 LAB — PROTIME-INR
INR: 1.12
Prothrombin Time: 14.3 seconds (ref 11.4–15.2)

## 2017-10-24 SURGERY — HEMORRHOIDECTOMY
Anesthesia: General

## 2017-10-24 MED ORDER — PROMETHAZINE HCL 25 MG/ML IJ SOLN: 6.2500 mg | INTRAMUSCULAR | Status: DC | PRN

## 2017-10-24 MED ORDER — CHLORHEXIDINE GLUCONATE CLOTH 2 % EX PADS: 6.0000 | MEDICATED_PAD | Freq: Once | CUTANEOUS | Status: DC

## 2017-10-24 MED ORDER — SUGAMMADEX SODIUM 200 MG/2ML IV SOLN
INTRAVENOUS | Status: AC
  Filled 2017-10-24: qty 2

## 2017-10-24 MED ORDER — PHENYLEPHRINE HCL 10 MG/ML IJ SOLN
INTRAMUSCULAR | Status: DC | PRN
  Administered 2017-10-24 (×4): 80 ug via INTRAVENOUS

## 2017-10-24 MED ORDER — LACTATED RINGERS IV SOLN
INTRAVENOUS | Status: DC | PRN
  Administered 2017-10-24 (×2): via INTRAVENOUS

## 2017-10-24 MED ORDER — CELECOXIB 200 MG PO CAPS: 200.0000 mg | ORAL_CAPSULE | ORAL | Status: DC

## 2017-10-24 MED ORDER — TAMSULOSIN HCL 0.4 MG PO CAPS: 0.4000 mg | ORAL_CAPSULE | Freq: Every day | ORAL | 0 refills | Status: DC

## 2017-10-24 MED ORDER — FENTANYL CITRATE (PF) 100 MCG/2ML IJ SOLN
INTRAMUSCULAR | Status: DC | PRN
Start: 2017-10-24 — End: 2017-10-24
  Administered 2017-10-24: 100 ug via INTRAVENOUS

## 2017-10-24 MED ORDER — EPHEDRINE SULFATE 50 MG/ML IJ SOLN
INTRAMUSCULAR | Status: DC | PRN
  Administered 2017-10-24: 10 mg via INTRAVENOUS
  Administered 2017-10-24: 5 mg via INTRAVENOUS

## 2017-10-24 MED ORDER — PROPOFOL 10 MG/ML IV BOLUS
INTRAVENOUS | Status: AC
  Filled 2017-10-24: qty 20

## 2017-10-24 MED ORDER — GABAPENTIN 300 MG PO CAPS
ORAL_CAPSULE | ORAL | Status: AC
  Administered 2017-10-24: 300 mg
  Filled 2017-10-24: qty 1

## 2017-10-24 MED ORDER — LIDOCAINE HCL (CARDIAC) 20 MG/ML IV SOLN
INTRAVENOUS | Status: DC | PRN
  Administered 2017-10-24: 100 mg via INTRAVENOUS

## 2017-10-24 MED ORDER — LIDOCAINE 2% (20 MG/ML) 5 ML SYRINGE
INTRAMUSCULAR | Status: AC
  Filled 2017-10-24: qty 5

## 2017-10-24 MED ORDER — BUPIVACAINE-EPINEPHRINE (PF) 0.25% -1:200000 IJ SOLN
INTRAMUSCULAR | Status: AC
  Filled 2017-10-24: qty 30

## 2017-10-24 MED ORDER — CEFAZOLIN SODIUM-DEXTROSE 2-4 GM/100ML-% IV SOLN
INTRAVENOUS | Status: AC
  Filled 2017-10-24: qty 100

## 2017-10-24 MED ORDER — CELECOXIB 200 MG PO CAPS
ORAL_CAPSULE | ORAL | Status: AC
  Administered 2017-10-24: 200 mg
  Filled 2017-10-24: qty 1

## 2017-10-24 MED ORDER — SUGAMMADEX SODIUM 200 MG/2ML IV SOLN
INTRAVENOUS | Status: DC | PRN
  Administered 2017-10-24: 225 mg via INTRAVENOUS

## 2017-10-24 MED ORDER — GABAPENTIN 300 MG PO CAPS: 300.0000 mg | ORAL_CAPSULE | ORAL | Status: DC

## 2017-10-24 MED ORDER — EPHEDRINE 5 MG/ML INJ
INTRAVENOUS | Status: AC
  Filled 2017-10-24: qty 10

## 2017-10-24 MED ORDER — BACIT-POLY-NEO HC 1 % EX OINT
TOPICAL_OINTMENT | CUTANEOUS | Status: AC
  Filled 2017-10-24: qty 15

## 2017-10-24 MED ORDER — DEXAMETHASONE SODIUM PHOSPHATE 10 MG/ML IJ SOLN
INTRAMUSCULAR | Status: AC
  Filled 2017-10-24: qty 1

## 2017-10-24 MED ORDER — BUPIVACAINE LIPOSOME 1.3 % IJ SUSP
20.0000 mL | INTRAMUSCULAR | Status: DC
  Filled 2017-10-24: qty 20

## 2017-10-24 MED ORDER — ACETAMINOPHEN 500 MG PO TABS: 1000.0000 mg | ORAL_TABLET | ORAL | Status: DC

## 2017-10-24 MED ORDER — CEFAZOLIN SODIUM-DEXTROSE 2-4 GM/100ML-% IV SOLN
2.0000 g | INTRAVENOUS | Status: AC
  Administered 2017-10-24: 2 g via INTRAVENOUS

## 2017-10-24 MED ORDER — DIBUCAINE 1 % RE OINT
TOPICAL_OINTMENT | RECTAL | Status: AC
  Filled 2017-10-24: qty 28

## 2017-10-24 MED ORDER — ACETAMINOPHEN 500 MG PO TABS
ORAL_TABLET | ORAL | Status: AC
Start: 2017-10-24 — End: 2017-10-24
  Administered 2017-10-24: 1000 mg
  Filled 2017-10-24: qty 2

## 2017-10-24 MED ORDER — MIDAZOLAM HCL 2 MG/2ML IJ SOLN
INTRAMUSCULAR | Status: AC
  Filled 2017-10-24: qty 2

## 2017-10-24 MED ORDER — PROPOFOL 10 MG/ML IV BOLUS
INTRAVENOUS | Status: DC | PRN
  Administered 2017-10-24: 160 mg via INTRAVENOUS

## 2017-10-24 MED ORDER — PHENYLEPHRINE 40 MCG/ML (10ML) SYRINGE FOR IV PUSH (FOR BLOOD PRESSURE SUPPORT)
PREFILLED_SYRINGE | INTRAVENOUS | Status: AC
  Filled 2017-10-24: qty 10

## 2017-10-24 MED ORDER — 0.9 % SODIUM CHLORIDE (POUR BTL) OPTIME
TOPICAL | Status: DC | PRN
  Administered 2017-10-24: 1000 mL

## 2017-10-24 MED ORDER — FENTANYL CITRATE (PF) 250 MCG/5ML IJ SOLN
INTRAMUSCULAR | Status: AC
Start: 2017-10-24 — End: ?
  Filled 2017-10-24: qty 5

## 2017-10-24 MED ORDER — ONDANSETRON HCL 4 MG/2ML IJ SOLN
INTRAMUSCULAR | Status: DC | PRN
  Administered 2017-10-24: 4 mg via INTRAVENOUS

## 2017-10-24 MED ORDER — ONDANSETRON HCL 4 MG/2ML IJ SOLN
INTRAMUSCULAR | Status: AC
  Filled 2017-10-24: qty 2

## 2017-10-24 MED ORDER — BUPIVACAINE-EPINEPHRINE 0.25% -1:200000 IJ SOLN
INTRAMUSCULAR | Status: DC | PRN
  Administered 2017-10-24: 30 mL

## 2017-10-24 MED ORDER — MIDAZOLAM HCL 5 MG/5ML IJ SOLN
INTRAMUSCULAR | Status: DC | PRN
  Administered 2017-10-24: 2 mg via INTRAVENOUS

## 2017-10-24 MED ORDER — HYDROCODONE-ACETAMINOPHEN 10-325 MG PO TABS: 1.0000 | ORAL_TABLET | Freq: Four times a day (QID) | ORAL | 0 refills | Status: DC | PRN

## 2017-10-24 MED ORDER — LACTATED RINGERS IV SOLN: INTRAVENOUS | Status: DC

## 2017-10-24 MED ORDER — TAMSULOSIN HCL 0.4 MG PO CAPS: 0.4000 mg | ORAL_CAPSULE | Freq: Once | ORAL | Status: DC

## 2017-10-24 MED ORDER — DEXAMETHASONE SODIUM PHOSPHATE 10 MG/ML IJ SOLN
INTRAMUSCULAR | Status: DC | PRN
  Administered 2017-10-24: 10 mg via INTRAVENOUS

## 2017-10-24 MED ORDER — BUPIVACAINE LIPOSOME 1.3 % IJ SUSP
INTRAMUSCULAR | Status: DC | PRN
  Administered 2017-10-24: 20 mL

## 2017-10-24 MED ORDER — HYDROMORPHONE HCL 1 MG/ML IJ SOLN: 0.2500 mg | INTRAMUSCULAR | Status: DC | PRN

## 2017-10-24 MED ORDER — ROCURONIUM BROMIDE 10 MG/ML (PF) SYRINGE
PREFILLED_SYRINGE | INTRAVENOUS | Status: AC
  Filled 2017-10-24: qty 5

## 2017-10-24 MED ORDER — ROCURONIUM BROMIDE 100 MG/10ML IV SOLN
INTRAVENOUS | Status: DC | PRN
  Administered 2017-10-24: 40 mg via INTRAVENOUS

## 2017-10-24 SURGICAL SUPPLY — 43 items
CANISTER SUCT 3000ML PPV (MISCELLANEOUS) ×3 IMPLANT
CONT SPEC 4OZ CLIKSEAL STRL BL (MISCELLANEOUS) ×2 IMPLANT
COVER SURGICAL LIGHT HANDLE (MISCELLANEOUS) ×3 IMPLANT
DECANTER SPIKE VIAL GLASS SM (MISCELLANEOUS) IMPLANT
DRAPE HALF SHEET 40X57 (DRAPES) ×3 IMPLANT
ELECT CAUTERY BLADE 6.4 (BLADE) ×3 IMPLANT
ELECT REM PT RETURN 9FT ADLT (ELECTROSURGICAL) ×3
ELECTRODE REM PT RTRN 9FT ADLT (ELECTROSURGICAL) ×1 IMPLANT
GAUZE SPONGE 4X4 12PLY STRL (GAUZE/BANDAGES/DRESSINGS) ×3 IMPLANT
GAUZE SPONGE 4X4 12PLY STRL LF (GAUZE/BANDAGES/DRESSINGS) ×2 IMPLANT
GLOVE BIOGEL M STRL SZ7.5 (GLOVE) ×3 IMPLANT
GLOVE BIOGEL PI IND STRL 8 (GLOVE) ×1 IMPLANT
GLOVE BIOGEL PI INDICATOR 8 (GLOVE) ×2
GOWN STRL REUS W/ TWL LRG LVL3 (GOWN DISPOSABLE) ×1 IMPLANT
GOWN STRL REUS W/ TWL XL LVL3 (GOWN DISPOSABLE) ×1 IMPLANT
GOWN STRL REUS W/TWL LRG LVL3 (GOWN DISPOSABLE) ×3
GOWN STRL REUS W/TWL XL LVL3 (GOWN DISPOSABLE) ×3
KIT BASIN OR (CUSTOM PROCEDURE TRAY) ×3 IMPLANT
KIT ROOM TURNOVER OR (KITS) ×3 IMPLANT
NDL HYPO 25GX1X1/2 BEV (NEEDLE) ×1 IMPLANT
NEEDLE HYPO 25GX1X1/2 BEV (NEEDLE) ×3 IMPLANT
NS IRRIG 1000ML POUR BTL (IV SOLUTION) ×3 IMPLANT
PACK LITHOTOMY IV (CUSTOM PROCEDURE TRAY) ×3 IMPLANT
PAD ARMBOARD 7.5X6 YLW CONV (MISCELLANEOUS) ×3 IMPLANT
PENCIL BUTTON HOLSTER BLD 10FT (ELECTRODE) ×3 IMPLANT
SPONGE LAP 18X18 X RAY DECT (DISPOSABLE) ×3 IMPLANT
SPONGE SURGIFOAM ABS GEL 100 (HEMOSTASIS) IMPLANT
SURGILUBE 2OZ TUBE FLIPTOP (MISCELLANEOUS) ×3 IMPLANT
SUT CHROMIC 2 0 SH (SUTURE) ×3 IMPLANT
SUT MNCRL AB 4-0 PS2 18 (SUTURE) ×4 IMPLANT
SUT VIC AB 2-0 CT1 27 (SUTURE) ×3
SUT VIC AB 2-0 CT1 TAPERPNT 27 (SUTURE) IMPLANT
SUT VIC AB 3-0 SH 27 (SUTURE) ×6
SUT VIC AB 3-0 SH 27X BRD (SUTURE) IMPLANT
SYR BULB 3OZ (MISCELLANEOUS) ×3 IMPLANT
SYR CONTROL 10ML LL (SYRINGE) ×3 IMPLANT
TOWEL OR 17X24 6PK STRL BLUE (TOWEL DISPOSABLE) ×3 IMPLANT
TOWEL OR 17X26 10 PK STRL BLUE (TOWEL DISPOSABLE) ×3 IMPLANT
TRAY PROCTOSCOPIC FIBER OPTIC (SET/KITS/TRAYS/PACK) IMPLANT
TUBE CONNECTING 12'X1/4 (SUCTIONS) ×1
TUBE CONNECTING 12X1/4 (SUCTIONS) ×2 IMPLANT
UNDERPAD 30X30 (UNDERPADS AND DIAPERS) ×3 IMPLANT
YANKAUER SUCT BULB TIP NO VENT (SUCTIONS) ×3 IMPLANT

## 2017-10-24 NOTE — Anesthesia Postprocedure Evaluation (Signed)
Anesthesia Post Note  Patient: [name]  Procedure(s) Performed: HEMORRHOIDECTOMY (N/A )     Patient location during evaluation: PACU Anesthesia Type: General Level of consciousness: awake and alert Pain management: pain level controlled Vital Signs Assessment: post-procedure vital signs reviewed and stable Respiratory status: spontaneous breathing, nonlabored ventilation, respiratory function stable and patient connected to nasal cannula oxygen Cardiovascular status: blood pressure returned to baseline and stable Postop Assessment: no apparent nausea or vomiting Anesthetic complications: no    Last Vitals:  Vitals:   10/24/17 1420 10/24/17 1450  BP: 123/77 (!) 150/84  Pulse: 63 64  Resp: 14 16  Temp: 36.7 C 36.7 C  SpO2: 96% 97%    Last Pain:  Vitals:   10/24/17 1420  TempSrc:   PainSc: 0-No pain                 Kyrielle Urbanski,JAMES TERRILL

## 2017-10-24 NOTE — Transfer of Care (Signed)
Immediate Anesthesia Transfer of Care Note  Patient: Peter [name]  Procedure(s) Performed: HEMORRHOIDECTOMY (N/A )  Patient Location: PACU  Anesthesia Type:General  Level of Consciousness: oriented, drowsy and patient cooperative  Airway & Oxygen Therapy: Patient Spontanous Breathing and Patient connected to face mask oxygen  Post-op Assessment: Report given to RN and Post -op Vital signs reviewed and stable  Post vital signs: Reviewed  Last Vitals:  Vitals:   10/24/17 0913 10/24/17 1327  BP: 132/66 126/74  Pulse: 70 66  Resp: 20 20  Temp: 36.8 C 36.7 C  SpO2: 98% 99%    Last Pain:  Vitals:   10/24/17 1327  TempSrc:   PainSc: 0-No pain      Patients Stated Pain Goal: 5 (44/01/02 7253)  Complications: No apparent anesthesia complications

## 2017-10-24 NOTE — Op Note (Signed)
10/24/2017  1:02 PM  PATIENT:  Peter [name]  71 y.o. male  Patient Care Team: [name], MD as PCP - General (Family Medicine) Richmond Campbell, MD as Consulting Physician (Gastroenterology)  PRE-OPERATIVE DIAGNOSIS:  INTERNAL BLEEDING HEMS  POST-OPERATIVE DIAGNOSIS:  INTERNAL BLEEDING HEMS  PROCEDURE:  Hemorrhoidectomy  SURGEON:  Sharon Mt. Bilan Tedesco, MD  ASSISTANT: None  ANESTHESIA:   general  COUNTS:  Sponge, needle and instrument counts were reported correct x2 at the conclusion of the operation.  EBL: 10cc  DRAINS: None  SPECIMEN:  1. Left lateral hemorrhoid bundle 2. Right anterior hemorrhoid bundle  FINDINGS: 2 column mixed internal and external hemorrhoids. 2 column excision with closure. There was additional small internal hemorrhoids in the right posterior position intentionally left in situ as they were not likely the source of bleeding and wanted to minimize risk of anal stenosis from excision of the 3rd column this go round.  DISPOSITION: PACU in satisfactory condition  INDICATION: Mr. Verno is a very pleasant 44M with hx of HTN, HLD, afib (on Xarelto) presents for evaluation of anal tissue prolapse and rectal bleeding x64yrs. Denies any real anorectal pain/discomfort.Happens with most BMs. Tissue typically self reduces. Denies prior anorectal procedures. Does not take a daily fiber supplement. Last colonoscopy was 4 years ago and was normal per the patient. On exam, he had multiple columns of mixed internal external hemorrhoids. Given that he had issues with prolapse and bleeding, surgical excision vs banding was offered. He elected to undergo hemorrhoidectomy.  The anatomy and physiology of the anal canal was described in detail using pictures/diagrams. The pathophysiology of hemorrhoids was also detailed. The procedure, material risks (including, but not limited to, pain, bleeding, infection, scarring, damage to surrounding structures, fecal  incontinence, need for blood transfusion, urinary retention, recurrence, need for additional procedures, heart attack, stroke, death), benefits and alternatives were explained. The patient's questions were answered to their satisfaction and he elected to proceed with surgery.  DESCRIPTION: The patient was identified in preop holding and taken to the OR where they were placed on the operating room table and SCDs were placed. General endotracheal anesthesia was induced without difficulty. The patient was then flipped over into the prone position. The buttock were gently taped apart and the patient was then prepped and draped in the usual sterile fashion. A surgical timeout was performed indicating the correct patient, procedure, positioning and need for preoperative antibiotics.  A rectal exam was performed. Anoscopy demonstrated his most significant hemorrhoid tissue was located in the left lateral and right anterior positions. A perianal block with exparel+marcaine with epinepherine was infiltrated. The left lateral column was then addressed. The tissue was elevated with a Debakey and the hemorrhoid column excised with electrocautery. The pedicle was then suture ligated with a 2-0 vicryl suture. The wound was irrigated and hemostasis verified. The wound was closed with a running locking 3-0 vicryl suture and the anoderm approximated with a 4-0 running subcuticular monocryl suture. The hemorrhoid was passed off as specimen. Attention was then turned to the right anterior bundle. The tissue was elevated with a Debakey and the hemorrhoid column excised with electrocautery. The pedicle was then suture ligated with a 2-0 vicryl suture. The wound was irrigated and hemostasis verified. The wound was closed with a running locking 3-0 vicryl suture and the anoderm approximated with a 4-0 running subcuticular monocryl suture. The hemorrhoid was passed off as specimen. Local anesthetic was then infiltrate into the  perianal skin around each separate hemorrhoid excision  site. The wound was dressed with bacitracin and covered with 4x4 gauze. The patient was then transferred back to a stretcher, extubated and transported back to PACU in satisfactory condition.  Note: This dictation was prepared with Dragon/digital dictation along with [employer]. Any transcriptional errors that result from this process are unintentional.

## 2017-10-24 NOTE — H&P (Signed)
H&P Update  H&P from 10/13/17 reviewed by myself - only change is in the plan.  PLAN -Hemorrhoidectomy today in OR -Prone position -The planned procedure, material risks (including but not limited pain, bleeding, need for transfusion, recurrence, infection, damage to surrounding structures including anal sphincter, need for additional procedures, fecal incontinence, heart attack, stroke, death) benefits and alternatives to surgery were discussed. He and his wife's questions were answered to their satisfaction and he elected to proceed.  Sharon Mt. Dema Severin, M.D. General and Colorectal Surgery [facility]

## 2017-10-24 NOTE — Anesthesia Procedure Notes (Signed)
Procedure Name: Intubation Date/Time: 10/24/2017 11:51 AM Performed by: Jenne Campus, CRNA Pre-anesthesia Checklist: Patient identified, Emergency Drugs available, Suction available and Patient being monitored Patient Re-evaluated:Patient Re-evaluated prior to induction Oxygen Delivery Method: Circle System Utilized Preoxygenation: Pre-oxygenation with 100% oxygen Induction Type: IV induction Ventilation: Mask ventilation without difficulty and Oral airway inserted - appropriate to patient size Laryngoscope Size: Sabra Heck and 2 Grade View: Grade II Tube type: Oral Tube size: 7.5 mm Number of attempts: 1 Airway Equipment and Method: Stylet and Oral airway Placement Confirmation: ETT inserted through vocal cords under direct vision,  positive ETCO2 and breath sounds checked- equal and bilateral Secured at: 23 cm Tube secured with: Tape Dental Injury: Teeth and Oropharynx as per pre-operative assessment

## 2017-10-24 NOTE — Anesthesia Preprocedure Evaluation (Addendum)
Anesthesia Evaluation  Patient identified by MRN, date of birth, ID band Patient awake    Reviewed: Allergy & Precautions, NPO status , Patient's Chart, lab work & pertinent test results, reviewed documented beta blocker date and time   Airway Mallampati: II  TM Distance: >3 FB Neck ROM: Full    Dental  (+) Teeth Intact, Dental Advisory Given   Pulmonary shortness of breath, sleep apnea ,    breath sounds clear to auscultation       Cardiovascular hypertension, Pt. on medications and Pt. on home beta blockers + CAD, + Past MI, +CHF and + DOE   Rhythm:Regular Rate:Normal     Neuro/Psych    GI/Hepatic GERD  Medicated and Controlled,  Endo/Other    Renal/GU Renal disease     Musculoskeletal   Abdominal (+) + obese,   Peds  Hematology  (+) anemia ,   Anesthesia Other Findings   Reproductive/Obstetrics                           Anesthesia Physical Anesthesia Plan  ASA: III  Anesthesia Plan: General   Post-op Pain Management:    Induction: Intravenous  PONV Risk Score and Plan: 3 and Treatment may vary due to age or medical condition  Airway Management Planned: Oral ETT  Additional Equipment:   Intra-op Plan:   Post-operative Plan: Extubation in OR  Informed Consent: I have reviewed the patients History and Physical, chart, labs and discussed the procedure including the risks, benefits and alternatives for the proposed anesthesia with the patient or authorized representative who has indicated his/her understanding and acceptance.   Dental advisory given  Plan Discussed with: CRNA  Anesthesia Plan Comments:       Anesthesia Quick Evaluation

## 2017-10-26 ENCOUNTER — Encounter (HOSPITAL_COMMUNITY): Payer: Self-pay | Admitting: Surgery

## 2017-11-03 ENCOUNTER — Encounter: Payer: Self-pay | Admitting: Cardiology

## 2017-11-03 ENCOUNTER — Ambulatory Visit (INDEPENDENT_AMBULATORY_CARE_PROVIDER_SITE_OTHER): Payer: Medicare Other | Admitting: Cardiology

## 2017-11-03 VITALS — BP 102/66 | HR 69 | Ht 68.0 in | Wt 233.0 lb

## 2017-11-03 DIAGNOSIS — I2583 Coronary atherosclerosis due to lipid rich plaque: Secondary | ICD-10-CM

## 2017-11-03 DIAGNOSIS — G4733 Obstructive sleep apnea (adult) (pediatric): Secondary | ICD-10-CM | POA: Diagnosis not present

## 2017-11-03 DIAGNOSIS — I4819 Other persistent atrial fibrillation: Secondary | ICD-10-CM

## 2017-11-03 DIAGNOSIS — I481 Persistent atrial fibrillation: Secondary | ICD-10-CM | POA: Diagnosis not present

## 2017-11-03 DIAGNOSIS — I10 Essential (primary) hypertension: Secondary | ICD-10-CM

## 2017-11-03 DIAGNOSIS — I2510 Atherosclerotic heart disease of native coronary artery without angina pectoris: Secondary | ICD-10-CM

## 2017-11-03 NOTE — Patient Instructions (Addendum)
Medication Instructions:  Your physician recommends that you continue on your current medications as directed. Please refer to the Current Medication list given to you today.  If you need a refill on your cardiac medications before your next appointment, please call your pharmacy.   Labwork: None ordered  Testing/Procedures: None ordered  Follow-Up: Your physician wants you to follow-up in: 6 months with Dr. Camnitz.  You will receive a reminder letter in the mail two months in advance. If you don't receive a letter, please call our office to schedule the follow-up appointment.  Thank you for choosing [facility]!!   Santi Troung, RN [phone]         

## 2017-11-03 NOTE — Progress Notes (Signed)
Electrophysiology Office Note   Date:  11/03/2017   ID:  Peter [name], DOB 06-Apr-1946, MRN [id number]  PCP:  [name], MD  Cardiologist:  Marlou Porch Primary Electrophysiologist:  Constance Haw, MD    Chief Complaint  Patient presents with  . Atrial Fibrillation     History of Present Illness: Peter [name] is a 71 y.o. male who presents today for electrophysiology evaluation.   History of CAD s/p PCI to LADx3 in 2006, previously chronic diastolic CHF, dyslipidemia, GERD, h/o GIB, HTN, hypertriglyceridemia, iron deficiency anemia, obesity, CKD stage III, recently diagnosed afib who was admitted with persistent afib (CHADSVASC 4). AF ablation 10/24/16.   Today, denies symptoms of palpitations, chest pain, shortness of breath, orthopnea, PND, lower extremity edema, claudication, dizziness, presyncope, syncope, bleeding, or neurologic sequela. The patient is tolerating medications without difficulties.  He is overall been feeling well without episodes of atrial fibrillation.  He recently had hemorrhoid surgery which has been causing him issues since December 14.   Past Medical History:  Diagnosis Date  . At risk for sleep apnea    STOP-BANG= 5       SENT TO PCP 07-12-2015  . CAD (coronary artery disease)    a. s/p PCI with DES x 3 to LAD in 2006 with bifurcation lesion and Plavix indefinitely was recommended.  . Chronic combined systolic and diastolic CHF (congestive heart failure) ([place])    a. Prior EF normal but EF dropped to 30-35% in 07/2016 in setting of AFIB.  . CKD (chronic kidney disease), stage III ([place])   . Dyslipidemia   . Dyspnea    tired quick d/t Afib  . Dyspnea on exertion   . GERD (gastroesophageal reflux disease)   . History of GI bleed   . History of kidney stones   . History of MI (myocardial infarction)    11-05-2005--  periprocedural MI  (cardiac cath) per dr [name] note  . Hypertension   . Hypertriglyceridemia   . Iron deficiency anemia   .  Myocardial infarction ([place])   . Persistent atrial fibrillation ([place])    a. 07/2016, started on Eliquis -> readmitted later that month for AF RVR/acute HF - initially unsuccessful DCCV following TEE, so loaded with amio with repeat successful DCCV 07/2816.  . Right ureteral stone   . S/P drug eluting coronary stent placement    x3  to LAD  2006  . Sleep apnea    wears BIPAP, 14 up to 24   Past Surgical History:  Procedure Laterality Date  . CARDIOVASCULAR STRESS TEST  01-06-2013  dr [name]   normal perfusion study/  no ischemia or infarct/  normal LV function and wall motion , ef 57%  . CARDIOVERSION N/A 08/05/2016   Procedure: CARDIOVERSION;  Surgeon: Sanda Klein, MD;  Location: [place];  Service: Cardiovascular;  Laterality: N/A;  . CARDIOVERSION N/A 08/08/2016   Procedure: CARDIOVERSION;  Surgeon: Jerline Pain, MD;  Location: [facility] ENDOSCOPY;  Service: Cardiovascular;  Laterality: N/A;  . CORONARY ANGIOPLASTY  11-13-2005 dr [name]   Successful touch up post-dilatation of the 3 tandem overlying Cypher stents in proximal to mid LAD (based on IVUS 0% stenosis and patent diagonal branch)/  Cutting Balloon Angioplasty of Ramus branch  . CORONARY ANGIOPLASTY WITH STENT PLACEMENT  11-05-2005   dr [name]   PCI and DES x3 to birfurcation lesion LAD and diagonal branch of LAD/   20% dLM,  30-40% RCA, ef 60%  . CYSTOSCOPY WITH  RETROGRADE PYELOGRAM, URETEROSCOPY AND STENT PLACEMENT Right 07/14/2015   Procedure: CYSTOSCOPY WITH RETROGRADE PYELOGRAM, URETEROSCOPY AND STENT PLACEMENT;  Surgeon: Alexis Frock, MD;  Location: [facility];  Service: Urology;  Laterality: Right;  . ELECTROPHYSIOLOGIC STUDY N/A 10/24/2016   Procedure: Atrial Fibrillation Ablation;  Surgeon: Ethyl Vila Meredith Leeds, MD;  Location: [place] CV LAB;  Service: Cardiovascular;  Laterality: N/A;  . EXTRACORPOREAL SHOCK WAVE LITHOTRIPSY  yrs ago  . HEMORRHOID SURGERY N/A 10/24/2017   Procedure:  HEMORRHOIDECTOMY;  Surgeon: Ileana Roup, MD;  Location: [place];  Service: General;  Laterality: N/A;  . IR GENERIC HISTORICAL  10/23/2016   IR FLUORO GUIDE CV LINE RIGHT 10/23/2016 Greggory Keen, MD [facility]  . IR GENERIC HISTORICAL  10/23/2016   IR US GUIDE VASC ACCESS RIGHT 10/23/2016 Greggory Keen, MD [facility]  . LEFT URETEROSCOPIC STONE EXTRACTION  04-10-2006  . STONE EXTRACTION WITH BASKET Right 07/14/2015   Procedure: STONE EXTRACTION WITH BASKET;  Surgeon: Alexis Frock, MD;  Location: [facility];  Service: Urology;  Laterality: Right;  . TEE WITHOUT CARDIOVERSION N/A 08/05/2016   Procedure: TRANSESOPHAGEAL ECHOCARDIOGRAM (TEE);  Surgeon: Sanda Klein, MD;  Location: [facility] ENDOSCOPY;  Service: Cardiovascular;  Laterality: N/A;  . TRANSTHORACIC ECHOCARDIOGRAM  02-04-2012   grade I diastolic dysfunction/  ef 55-60%/  trivial MR and TR/  mild LAE     Current Outpatient Medications  Medication Sig Dispense Refill  . atorvastatin (LIPITOR) 40 MG tablet TAKE 1 TABLET(40 MG) BY MOUTH EVERY MORNING 90 tablet 3  . carvedilol (COREG) 25 MG tablet Take 1 tablet (25 mg total) by mouth 2 (two) times daily with a meal. 60 tablet 6  . diphenhydramine-acetaminophen (TYLENOL PM) 25-500 MG TABS tablet Take 1 tablet by mouth at bedtime.    . ferrous sulfate 325 (65 FE) MG tablet Take 325 mg by mouth every morning.     . furosemide (LASIX) 40 MG tablet TAKE 1 TABLET(40 MG) BY MOUTH DAILY 90 tablet 3  . HYDROcodone-acetaminophen (NORCO) 10-325 MG tablet Take 1 tablet by mouth every 6 (six) hours as needed. 40 tablet 0  . nitroGLYCERIN (NITROSTAT) 0.4 MG SL tablet Place 1 tablet (0.4 mg total) under the tongue every 5 (five) minutes as needed for chest pain. X 3 doses 25 tablet prn  . pantoprazole (PROTONIX) 40 MG tablet Take 40 mg by mouth daily.     . polyvinyl alcohol (LIQUIFILM TEARS) 1.4 % ophthalmic solution Place 1 drop into both eyes daily as needed for dry eyes.      . potassium chloride SA (K-DUR,KLOR-CON) 20 MEQ tablet TAKE 1 TABLET(20 MEQ) BY MOUTH DAILY 90 tablet 3  . lisinopril (PRINIVIL,ZESTRIL) 40 MG tablet Take 1 tablet (40 mg total) by mouth daily. 90 tablet 3   No current facility-administered medications for this visit.     Allergies:   Patient has no known allergies.   Social History:  The patient  reports that  has never smoked. he has never used smokeless tobacco. He reports that he does not drink alcohol or use drugs.   Family History:  The patient's family history includes Heart attack (age of onset: 87) in his father.    ROS:  Please see the history of present illness.   Otherwise, review of systems is positive for none.   All other systems are reviewed and negative.   PHYSICAL EXAM: VS:  BP 102/66   Pulse 69   Ht 5\' 8"  (1.727 m)  Wt 233 lb (105.7 kg)   SpO2 97%   BMI 35.43 kg/m  , BMI Body mass index is 35.43 kg/m. GEN: Well nourished, well developed, in no acute distress  HEENT: normal  Neck: no JVD, carotid bruits, or masses Cardiac: RRR; no murmurs, rubs, or gallops,no edema  Respiratory:  clear to auscultation bilaterally, normal work of breathing GI: soft, nontender, nondistended, + BS MS: no deformity or atrophy  Skin: warm and dry Neuro:  Strength and sensation are intact Psych: euthymic mood, full affect  EKG:  EKG is ordered today. Personal review of the ekg ordered shows sinus rhythm, rate 68, LAD, lateral Q waves   Recent Labs: 10/17/2017: BUN 16; Creatinine, Ser 1.34; Hemoglobin 11.9; Platelets 143; Potassium 4.2; Sodium 142    Lipid Panel     Component Value Date/Time   CHOL 140 01/01/2013 1037   TRIG 230.0 (H) 01/01/2013 1037   HDL 38.20 (L) 01/01/2013 1037   CHOLHDL 4 01/01/2013 1037   VLDL 46.0 (H) 01/01/2013 1037   LDLDIRECT 68.6 01/01/2013 1037     Wt Readings from Last 3 Encounters:  11/03/17 233 lb (105.7 kg)  10/17/17 242 lb 4.8 oz (109.9 kg)  04/30/17 233 lb (105.7 kg)       Other studies Reviewed: Additional studies/ records that were reviewed today include: TEE 11/22/16 Review of the above records today demonstrates:  - Left ventricle: The cavity size was normal. Wall thickness was   increased in a pattern of mild LVH. Systolic function was normal.   The estimated ejection fraction was in the range of 55% to 60%.   Left ventricular diastolic function parameters were normal. - Mitral valve: Calcified annulus. Mildly thickened leaflets . - Left atrium: The atrium was mildly dilated. - Atrial septum: No defect or patent foramen ovale was identified.   ASSESSMENT AND PLAN:  1.  Persistent atrial fibrillation/flutter: Currently on Coreg and Xarelto.  Tolerating well.  No further episodes of atrial fibrillation since ablation.  Continue with current medical management.  This patients CHA2DS2-VASc Score and unadjusted Ischemic Stroke Rate (% per year) is equal to 4.8 % stroke rate/year from a score of 4  Above score calculated as 1 point each if present [CHF, HTN, DM, Vascular=MI/PAD/Aortic Plaque, Age if 65-74, or Male] Above score calculated as 2 points each if present [Age > 75, or Stroke/TIA/TE]  2. Likely nonischemic cardiomyopathy: Ejection fraction improved to 55-60%.  No changes.  3. HTN: Well-controlled today.  No changes  4. CAD s/p PCI to LAD in 2006: Peter [name] asymptomatic.  No changes.  5. OSA: On BiPAP.  Encourage compliance  Current medicines are reviewed at length with the patient today.   The patient does not have concerns regarding his medicines.  The following changes were made today: None  Labs/ tests ordered today include:  Orders Placed This Encounter  Procedures  . EKG 12-Lead     Disposition:   FU with Roy Snuffer 6 months  Signed, Rane Blitch Meredith Leeds, MD  11/03/2017 11:15 AM     [facility] [address] [place] [address] [phone] (office) [phone] (fax)

## 2017-11-06 ENCOUNTER — Other Ambulatory Visit: Payer: Self-pay

## 2017-11-06 ENCOUNTER — Other Ambulatory Visit: Payer: Self-pay | Admitting: Cardiology

## 2017-11-06 MED ORDER — FUROSEMIDE 40 MG PO TABS: ORAL_TABLET | ORAL | 3 refills | Status: DC

## 2017-11-06 MED ORDER — FERROUS SULFATE 325 (65 FE) MG PO TABS: 325.0000 mg | ORAL_TABLET | Freq: Every morning | ORAL | 3 refills | Status: DC

## 2017-11-06 MED ORDER — PANTOPRAZOLE SODIUM 40 MG PO TBEC: 40.0000 mg | DELAYED_RELEASE_TABLET | Freq: Every day | ORAL | 3 refills | Status: DC

## 2017-11-06 MED ORDER — POTASSIUM CHLORIDE CRYS ER 20 MEQ PO TBCR: EXTENDED_RELEASE_TABLET | ORAL | 3 refills | Status: DC

## 2017-11-06 MED ORDER — CARVEDILOL 25 MG PO TABS: 25.0000 mg | ORAL_TABLET | Freq: Two times a day (BID) | ORAL | 3 refills | Status: DC

## 2017-11-10 ENCOUNTER — Telehealth: Payer: Self-pay | Admitting: Cardiology

## 2017-11-10 MED ORDER — LISINOPRIL 40 MG PO TABS: ORAL_TABLET | ORAL | 3 refills | Status: DC

## 2017-11-10 NOTE — Telephone Encounter (Signed)
Pt calling requesting a refill on his Xarelto, medication is no longer on pt's medication list, pt stated that he was taken off for a week for surgery. Pt would like for this medication to be sent to [facility]. Please address

## 2017-11-12 ENCOUNTER — Other Ambulatory Visit: Payer: Self-pay

## 2017-11-12 MED ORDER — XARELTO 20 MG PO TABS: 20.0000 mg | ORAL_TABLET | Freq: Every day | ORAL | 11 refills | Status: DC

## 2017-11-13 ENCOUNTER — Observation Stay (HOSPITAL_COMMUNITY)
Admission: EM | Admit: 2017-11-13 | Discharge: 2017-11-14 | Disposition: A | Discharge status: Home / Self Care | Payer: Medicare HMO | Attending: Internal Medicine | Admitting: Internal Medicine

## 2017-11-13 ENCOUNTER — Encounter (HOSPITAL_COMMUNITY): Payer: Self-pay | Admitting: Emergency Medicine

## 2017-11-13 ENCOUNTER — Other Ambulatory Visit: Payer: Self-pay

## 2017-11-13 DIAGNOSIS — N183 Chronic kidney disease, stage 3 (moderate): Secondary | ICD-10-CM | POA: Insufficient documentation

## 2017-11-13 DIAGNOSIS — K625 Hemorrhage of anus and rectum: Secondary | ICD-10-CM | POA: Diagnosis not present

## 2017-11-13 DIAGNOSIS — E785 Hyperlipidemia, unspecified: Secondary | ICD-10-CM | POA: Diagnosis not present

## 2017-11-13 DIAGNOSIS — Z955 Presence of coronary angioplasty implant and graft: Secondary | ICD-10-CM | POA: Diagnosis not present

## 2017-11-13 DIAGNOSIS — Z79899 Other long term (current) drug therapy: Secondary | ICD-10-CM | POA: Diagnosis not present

## 2017-11-13 DIAGNOSIS — I5042 Chronic combined systolic (congestive) and diastolic (congestive) heart failure: Secondary | ICD-10-CM | POA: Diagnosis not present

## 2017-11-13 DIAGNOSIS — I130 Hypertensive heart and chronic kidney disease with heart failure and stage 1 through stage 4 chronic kidney disease, or unspecified chronic kidney disease: Secondary | ICD-10-CM | POA: Diagnosis not present

## 2017-11-13 DIAGNOSIS — E781 Pure hyperglyceridemia: Secondary | ICD-10-CM | POA: Diagnosis not present

## 2017-11-13 DIAGNOSIS — D62 Acute posthemorrhagic anemia: Secondary | ICD-10-CM | POA: Diagnosis not present

## 2017-11-13 DIAGNOSIS — K219 Gastro-esophageal reflux disease without esophagitis: Secondary | ICD-10-CM | POA: Diagnosis not present

## 2017-11-13 DIAGNOSIS — I252 Old myocardial infarction: Secondary | ICD-10-CM | POA: Diagnosis not present

## 2017-11-13 DIAGNOSIS — E669 Obesity, unspecified: Secondary | ICD-10-CM | POA: Insufficient documentation

## 2017-11-13 DIAGNOSIS — Z6835 Body mass index (BMI) 35.0-35.9, adult: Secondary | ICD-10-CM | POA: Insufficient documentation

## 2017-11-13 DIAGNOSIS — I2510 Atherosclerotic heart disease of native coronary artery without angina pectoris: Secondary | ICD-10-CM | POA: Diagnosis not present

## 2017-11-13 DIAGNOSIS — G4730 Sleep apnea, unspecified: Secondary | ICD-10-CM | POA: Insufficient documentation

## 2017-11-13 DIAGNOSIS — Z9889 Other specified postprocedural states: Secondary | ICD-10-CM | POA: Insufficient documentation

## 2017-11-13 DIAGNOSIS — Z7901 Long term (current) use of anticoagulants: Secondary | ICD-10-CM | POA: Diagnosis not present

## 2017-11-13 DIAGNOSIS — I481 Persistent atrial fibrillation: Secondary | ICD-10-CM | POA: Insufficient documentation

## 2017-11-13 DIAGNOSIS — K921 Melena: Secondary | ICD-10-CM | POA: Diagnosis not present

## 2017-11-13 LAB — CBC
HCT: 38.3 % — ABNORMAL LOW (ref 39.0–52.0)
HCT: 39.5 % (ref 39.0–52.0)
HEMOGLOBIN: 12.7 g/dL — AB (ref 13.0–17.0)
HEMOGLOBIN: 13.2 g/dL (ref 13.0–17.0)
MCH: 32.2 pg (ref 26.0–34.0)
MCH: 32.9 pg (ref 26.0–34.0)
MCHC: 33.2 g/dL (ref 30.0–36.0)
MCHC: 33.4 g/dL (ref 30.0–36.0)
MCV: 97.2 fL (ref 78.0–100.0)
MCV: 98.5 fL (ref 78.0–100.0)
Platelets: 169 10*3/uL (ref 150–400)
Platelets: 218 10*3/uL (ref 150–400)
RBC: 3.94 MIL/uL — AB (ref 4.22–5.81)
RBC: 4.01 MIL/uL — ABNORMAL LOW (ref 4.22–5.81)
RDW: 12.6 % (ref 11.5–15.5)
RDW: 12.8 % (ref 11.5–15.5)
WBC: 5.9 10*3/uL (ref 4.0–10.5)
WBC: 8.3 10*3/uL (ref 4.0–10.5)

## 2017-11-13 LAB — COMPREHENSIVE METABOLIC PANEL
ALK PHOS: 71 U/L (ref 38–126)
ALT: 20 U/L (ref 17–63)
ANION GAP: 7 (ref 5–15)
AST: 22 U/L (ref 15–41)
Albumin: 3.9 g/dL (ref 3.5–5.0)
BUN: 27 mg/dL — ABNORMAL HIGH (ref 6–20)
CO2: 25 mmol/L (ref 22–32)
Calcium: 9.3 mg/dL (ref 8.9–10.3)
Chloride: 109 mmol/L (ref 101–111)
Creatinine, Ser: 1.44 mg/dL — ABNORMAL HIGH (ref 0.61–1.24)
GFR calc non Af Amer: 47 mL/min — ABNORMAL LOW (ref >60)
GFR, EST AFRICAN AMERICAN: 55 mL/min — AB (ref >60)
Glucose, Bld: 104 mg/dL — ABNORMAL HIGH (ref 65–99)
Potassium: 4.1 mmol/L (ref 3.5–5.1)
SODIUM: 141 mmol/L (ref 135–145)
Total Bilirubin: 1 mg/dL (ref 0.3–1.2)
Total Protein: 6.7 g/dL (ref 6.5–8.1)

## 2017-11-13 LAB — TYPE AND SCREEN
ABO/RH(D): O POS
ANTIBODY SCREEN: NEGATIVE

## 2017-11-13 LAB — ABO/RH: ABO/RH(D): O POS

## 2017-11-13 LAB — PROTIME-INR
INR: 2.37
PROTHROMBIN TIME: 25.7 s — AB (ref 11.4–15.2)

## 2017-11-13 MED ORDER — XARELTO 20 MG PO TABS: 20.0000 mg | ORAL_TABLET | Freq: Every day | ORAL | 10 refills | Status: DC

## 2017-11-13 MED ORDER — FERROUS SULFATE 325 (65 FE) MG PO TABS
325.0000 mg | ORAL_TABLET | Freq: Every morning | ORAL | Status: DC
  Administered 2017-11-14: 325 mg via ORAL
  Filled 2017-11-13: qty 1

## 2017-11-13 MED ORDER — ONDANSETRON HCL 4 MG/2ML IJ SOLN: 4.0000 mg | Freq: Four times a day (QID) | INTRAMUSCULAR | Status: DC | PRN

## 2017-11-13 MED ORDER — ATORVASTATIN CALCIUM 40 MG PO TABS
40.0000 mg | ORAL_TABLET | Freq: Every morning | ORAL | Status: DC
  Administered 2017-11-14: 40 mg via ORAL
  Filled 2017-11-13: qty 1

## 2017-11-13 MED ORDER — ACETAMINOPHEN 500 MG PO TABS
500.0000 mg | ORAL_TABLET | Freq: Every day | ORAL | Status: DC
  Administered 2017-11-13: 500 mg via ORAL
  Filled 2017-11-13: qty 1

## 2017-11-13 MED ORDER — PANTOPRAZOLE SODIUM 40 MG PO TBEC
40.0000 mg | DELAYED_RELEASE_TABLET | Freq: Every day | ORAL | Status: DC
  Administered 2017-11-14: 40 mg via ORAL
  Filled 2017-11-13: qty 1

## 2017-11-13 MED ORDER — POTASSIUM CHLORIDE CRYS ER 20 MEQ PO TBCR
20.0000 meq | EXTENDED_RELEASE_TABLET | Freq: Every day | ORAL | Status: DC
  Administered 2017-11-14: 20 meq via ORAL
  Filled 2017-11-13: qty 1

## 2017-11-13 MED ORDER — CARVEDILOL 25 MG PO TABS
25.0000 mg | ORAL_TABLET | Freq: Two times a day (BID) | ORAL | Status: DC
  Administered 2017-11-13 – 2017-11-14 (×2): 25 mg via ORAL
  Filled 2017-11-13 (×2): qty 1

## 2017-11-13 MED ORDER — POLYVINYL ALCOHOL 1.4 % OP SOLN
1.0000 [drp] | Freq: Every day | OPHTHALMIC | Status: DC
  Filled 2017-11-13: qty 15

## 2017-11-13 MED ORDER — ONDANSETRON HCL 4 MG PO TABS: 4.0000 mg | ORAL_TABLET | Freq: Four times a day (QID) | ORAL | Status: DC | PRN

## 2017-11-13 MED ORDER — LISINOPRIL 20 MG PO TABS
40.0000 mg | ORAL_TABLET | Freq: Every day | ORAL | Status: DC
  Administered 2017-11-14: 40 mg via ORAL
  Filled 2017-11-13: qty 2

## 2017-11-13 MED ORDER — DIPHENHYDRAMINE-APAP (SLEEP) 25-500 MG PO TABS: 1.0000 | ORAL_TABLET | Freq: Every day | ORAL | Status: DC

## 2017-11-13 MED ORDER — FUROSEMIDE 40 MG PO TABS
40.0000 mg | ORAL_TABLET | Freq: Every day | ORAL | Status: DC
  Administered 2017-11-14: 40 mg via ORAL
  Filled 2017-11-13 (×2): qty 1

## 2017-11-13 MED ORDER — DIPHENHYDRAMINE HCL 25 MG PO CAPS
25.0000 mg | ORAL_CAPSULE | Freq: Every day | ORAL | Status: DC
  Administered 2017-11-13: 25 mg via ORAL
  Filled 2017-11-13: qty 1

## 2017-11-13 NOTE — ED Notes (Signed)
ED TO INPATIENT HANDOFF REPORT  Name/Age/Gender Peter [name] 72 y.o. male  Code Status Code Status History    Date Active Date Inactive Code Status Order ID Comments User Context   10/24/2016 16:16 10/25/2016 19:32 Full Code [id number]  Constance Haw, MD Inpatient   08/03/2016 15:32 08/09/2016 16:36 Full Code [id number]  Willia Craze, NP ED   07/16/2016 17:13 07/17/2016 20:32 Full Code [id number]  Tawni Millers, MD Inpatient   02/03/2012 22:48 02/05/2012 15:34 Full Code [id number]  Roxan Diesel, RN Inpatient    Advance Directive Documentation     Most Recent Value  Type of Advance Directive  Living will  Pre-existing out of facility DNR order (yellow form or pink MOST form)  No data  "MOST" Form in Place?  No data      Home/SNF/Other Home  Chief Complaint rectal bleeding   Level of Care/Admitting Diagnosis ED Disposition    ED Disposition Condition Comment   Admit  Hospital Area: [facility] [[id number]]  Level of Care: Med-Surg [16]  Diagnosis: Acute blood loss anemia [016010]  Admitting Physician: Maryruth Hancock  Attending Physician: Theodis Blaze [[id number]]  PT Class (Do Not Modify): Observation [104]  PT Acc Code (Do Not Modify): Observation [10022]       Medical History Past Medical History:  Diagnosis Date  . At risk for sleep apnea    STOP-BANG= 5       SENT TO PCP 07-12-2015  . CAD (coronary artery disease)    a. s/p PCI with DES x 3 to LAD in 2006 with bifurcation lesion and Plavix indefinitely was recommended.  . Chronic combined systolic and diastolic CHF (congestive heart failure) ([place])    a. Prior EF normal but EF dropped to 30-35% in 07/2016 in setting of AFIB.  . CKD (chronic kidney disease), stage III ([place])   . Dyslipidemia   . Dyspnea    tired quick d/t Afib  . Dyspnea on exertion   . GERD (gastroesophageal reflux disease)   . History of GI bleed   . History of kidney stones   . History of MI  (myocardial infarction)    11-05-2005--  periprocedural MI  (cardiac cath) per dr [name] note  . Hypertension   . Hypertriglyceridemia   . Iron deficiency anemia   . Myocardial infarction ([place])   . Persistent atrial fibrillation ([place])    a. 07/2016, started on Eliquis -> readmitted later that month for AF RVR/acute HF - initially unsuccessful DCCV following TEE, so loaded with amio with repeat successful DCCV 07/2816.  . Right ureteral stone   . S/P drug eluting coronary stent placement    x3  to LAD  2006  . Sleep apnea    wears BIPAP, 14 up to 24    Allergies No Known Allergies  IV Location/Drains/Wounds Patient Lines/Drains/Airways Status   Active Line/Drains/Airways    Name:   Placement date:   Placement time:   Site:   Days:   Peripheral IV 11/13/17 Left Antecubital   11/13/17    1053    Antecubital   less than 1   Incision (Closed) 10/24/17 Buttocks   10/24/17    1258     20          Labs/Imaging Results for orders placed or performed during the hospital encounter of 11/13/17 (from the past 48 hour(s))  Comprehensive metabolic panel     Status: Abnormal   Collection Time: 11/13/17 10:52  AM  Result Value Ref Range   Sodium 141 135 - 145 mmol/L   Potassium 4.1 3.5 - 5.1 mmol/L   Chloride 109 101 - 111 mmol/L   CO2 25 22 - 32 mmol/L   Glucose, Bld 104 (H) 65 - 99 mg/dL   BUN 27 (H) 6 - 20 mg/dL   Creatinine, Ser 1.44 (H) 0.61 - 1.24 mg/dL   Calcium 9.3 8.9 - 10.3 mg/dL   Total Protein 6.7 6.5 - 8.1 g/dL   Albumin 3.9 3.5 - 5.0 g/dL   AST 22 15 - 41 U/L   ALT 20 17 - 63 U/L   Alkaline Phosphatase 71 38 - 126 U/L   Total Bilirubin 1.0 0.3 - 1.2 mg/dL   GFR calc non Af Amer 47 (L) >60 mL/min   GFR calc Af Amer 55 (L) >60 mL/min    Comment: (NOTE) The eGFR has been calculated using the CKD EPI equation. This calculation has not been validated in all clinical situations. eGFR's persistently <60 mL/min signify possible Chronic Kidney Disease.    Anion gap 7 5 - 15   CBC     Status: Abnormal   Collection Time: 11/13/17 10:52 AM  Result Value Ref Range   WBC 5.9 4.0 - 10.5 K/uL   RBC 3.94 (L) 4.22 - 5.81 MIL/uL   Hemoglobin 12.7 (L) 13.0 - 17.0 g/dL   HCT 38.3 (L) 39.0 - 52.0 %   MCV 97.2 78.0 - 100.0 fL   MCH 32.2 26.0 - 34.0 pg   MCHC 33.2 30.0 - 36.0 g/dL   RDW 12.6 11.5 - 15.5 %   Platelets 169 150 - 400 K/uL  Type and screen [place]     Status: None   Collection Time: 11/13/17 10:52 AM  Result Value Ref Range   ABO/RH(D) O POS    Antibody Screen NEG    Sample Expiration 11/16/2017   Protime-INR     Status: Abnormal   Collection Time: 11/13/17 10:52 AM  Result Value Ref Range   Prothrombin Time 25.7 (H) 11.4 - 15.2 seconds   INR 2.37   ABO/Rh     Status: None   Collection Time: 11/13/17 10:52 AM  Result Value Ref Range   ABO/RH(D) O POS    No results found.  Pending Labs [employer] (From admission, onward)   Start     Ordered   Signed and Occupational hygienist morning,   R     Signed and Held   Signed and Held  CBC  Tomorrow morning,   R     Signed and Held      Vitals/Pain Today's Vitals   11/13/17 1052 11/13/17 1215 11/13/17 1219 11/13/17 1230  BP: (!) 143/83 135/64 133/83 109/86  Pulse: 65 70 71 66  Resp:   18   Temp:      TempSrc:      SpO2: 97% 96% 97% 98%  Weight:      Height:      PainSc:        Isolation Precautions No active isolations  Medications Medications - No data to display  Mobility walks with person assist

## 2017-11-13 NOTE — Telephone Encounter (Signed)
Rx was sent in yesterday by Hardie Pulley, CMA

## 2017-11-13 NOTE — ED Provider Notes (Signed)
[facility] Provider Note   CSN: [id number] Arrival date & time: 11/13/17  4098     History   Chief Complaint Chief Complaint  Patient presents with  . Rectal Bleeding    HPI BRYAN GOIN is a 72 y.o. male.  HPI   72 year old male with extensive past medical history as below on Xarelto who presents with rectal bleeding.  The patient is status post internal hemorrhoidectomy on 12/14 with Dr. Dema Severin.  He had been recovering well but has begun passing large amounts of bright red blood over the last 24 hours.  He notified the nursing staff at the surgery office and was sent here.  Denies any fevers.  Denies any ongoing pain.  Patient has been taking his Xarelto. No abdominal pain. No constipation. He's having normal brown but blood-streaked stools. He states he's soaked through > 1 pad per hour throughout the day today.  Past Medical History:  Diagnosis Date  . At risk for sleep apnea    STOP-BANG= 5       SENT TO PCP 07-12-2015  . CAD (coronary artery disease)    a. s/p PCI with DES x 3 to LAD in 2006 with bifurcation lesion and Plavix indefinitely was recommended.  . Chronic combined systolic and diastolic CHF (congestive heart failure) ([place])    a. Prior EF normal but EF dropped to 30-35% in 07/2016 in setting of AFIB.  . CKD (chronic kidney disease), stage III ([place])   . Dyslipidemia   . Dyspnea    tired quick d/t Afib  . Dyspnea on exertion   . GERD (gastroesophageal reflux disease)   . History of GI bleed   . History of kidney stones   . History of MI (myocardial infarction)    11-05-2005--  periprocedural MI  (cardiac cath) per dr [name] note  . Hypertension   . Hypertriglyceridemia   . Iron deficiency anemia   . Myocardial infarction ([place])   . Persistent atrial fibrillation ([place])    a. 07/2016, started on Eliquis -> readmitted later that month for AF RVR/acute HF - initially unsuccessful DCCV following TEE, so loaded with amio  with repeat successful DCCV 07/2816.  . Right ureteral stone   . S/P drug eluting coronary stent placement    x3  to LAD  2006  . Sleep apnea    wears BIPAP, 14 up to 24    Patient Active Problem List   Diagnosis Date Noted  . Acute blood loss anemia 11/13/2017  . AF (atrial fibrillation) ([place]) 10/24/2016  . Obesity 08/09/2016  . Atrial fibrillation with RVR ([place]) 08/03/2016  . New onset a-fib ([place]) 07/16/2016  . Persistent atrial fibrillation ([place]) 07/16/2016  . Acute systolic (congestive) heart failure ([place])   . Ejection fraction   . Iron deficiency anemia due to chronic blood loss 02/04/2012  . Anemia 02/03/2012  . CKD (chronic kidney disease), stage III ([place]) 02/03/2012  . DOE (dyspnea on exertion) 01/28/2012  . Hypertension   . Coronary artery disease due to lipid rich plaque   . Dyslipidemia   . GERD (gastroesophageal reflux disease)     Past Surgical History:  Procedure Laterality Date  . CARDIOVASCULAR STRESS TEST  01-06-2013  dr [name]   normal perfusion study/  no ischemia or infarct/  normal LV function and wall motion , ef 57%  . CARDIOVERSION N/A 08/05/2016   Procedure: CARDIOVERSION;  Surgeon: Sanda Klein, MD;  Location: [facility];  Service:  Cardiovascular;  Laterality: N/A;  . CARDIOVERSION N/A 08/08/2016   Procedure: CARDIOVERSION;  Surgeon: Jerline Pain, MD;  Location: [facility];  Service: Cardiovascular;  Laterality: N/A;  . CORONARY ANGIOPLASTY  11-13-2005 dr [name]   Successful touch up post-dilatation of the 3 tandem overlying Cypher stents in proximal to mid LAD (based on IVUS 0% stenosis and patent diagonal branch)/  Cutting Balloon Angioplasty of Ramus branch  . CORONARY ANGIOPLASTY WITH STENT PLACEMENT  11-05-2005   dr [name]   PCI and DES x3 to birfurcation lesion LAD and diagonal branch of LAD/   20% dLM,  30-40% RCA, ef 60%  . CYSTOSCOPY WITH RETROGRADE PYELOGRAM, URETEROSCOPY AND STENT PLACEMENT Right 07/14/2015   Procedure: CYSTOSCOPY  WITH RETROGRADE PYELOGRAM, URETEROSCOPY AND STENT PLACEMENT;  Surgeon: Alexis Frock, MD;  Location: [facility];  Service: Urology;  Laterality: Right;  . ELECTROPHYSIOLOGIC STUDY N/A 10/24/2016   Procedure: Atrial Fibrillation Ablation;  Surgeon: Will Meredith Leeds, MD;  Location: [facility];  Service: Cardiovascular;  Laterality: N/A;  . EXTRACORPOREAL SHOCK WAVE LITHOTRIPSY  yrs ago  . HEMORRHOID SURGERY N/A 10/24/2017   Procedure: HEMORRHOIDECTOMY;  Surgeon: Ileana Roup, MD;  Location: [facility];  Service: General;  Laterality: N/A;  . IR GENERIC HISTORICAL  10/23/2016   IR FLUORO GUIDE CV LINE RIGHT 10/23/2016 Greggory Keen, MD [facility]  . IR GENERIC HISTORICAL  10/23/2016   IR US GUIDE VASC ACCESS RIGHT 10/23/2016 Greggory Keen, MD [facility]  . LEFT URETEROSCOPIC STONE EXTRACTION  04-10-2006  . STONE EXTRACTION WITH BASKET Right 07/14/2015   Procedure: STONE EXTRACTION WITH BASKET;  Surgeon: Alexis Frock, MD;  Location: [facility];  Service: Urology;  Laterality: Right;  . TEE WITHOUT CARDIOVERSION N/A 08/05/2016   Procedure: TRANSESOPHAGEAL ECHOCARDIOGRAM (TEE);  Surgeon: Sanda Klein, MD;  Location: [facility] END[facility]COPY;  Service: Cardiovascular;  Laterality: N/A;  . TRANSTHORACIC ECHOCARDIOGRAM  02-04-2012   grade I diastolic dysfunction/  ef 55-60%/  trivial MR and TR/  mild LAE       Home Medications    Prior to Admission medications   Medication Sig Start Date End Date Taking? Authorizing Provider  atorvastatin (LIPITOR) 40 MG tablet TAKE 1 TABLET(40 MG) BY MOUTH EVERY MORNING 09/17/17  Yes Jerline Pain, MD  carvedilol (COREG) 25 MG tablet Take 1 tablet (25 mg total) by mouth 2 (two) times daily with a meal. 11/06/17  Yes Camnitz, Will Hassell Done, MD  diphenhydramine-acetaminophen (TYLENOL PM) 25-500 MG TABS tablet Take 1 tablet by mouth at bedtime.   Yes [provider]  ferrous sulfate 325 (65 FE) MG tablet Take 1  tablet (325 mg total) by mouth every morning. 11/06/17  Yes Camnitz, Will Hassell Done, MD  furosemide (LASIX) 40 MG tablet TAKE 1 TABLET(40 MG) BY MOUTH DAILY 11/06/17  Yes Camnitz, Will Hassell Done, MD  lisinopril (PRINIVIL,ZESTRIL) 40 MG tablet TAKE 1 TABLET(40 MG) BY MOUTH DAILY 11/10/17  Yes Camnitz, Ocie Doyne, MD  nitroGLYCERIN (NITROSTAT) 0.4 MG SL tablet Place 1 tablet (0.4 mg total) under the tongue every 5 (five) minutes as needed for chest pain. X 3 doses 02/24/17  Yes Jerline Pain, MD  pantoprazole (PROTONIX) 40 MG tablet Take 1 tablet (40 mg total) by mouth daily. 11/06/17  Yes Camnitz, Will Hassell Done, MD  polyvinyl alcohol (LIQUIFILM TEARS) 1.4 % ophthalmic solution Place 1 drop into the right eye daily.    Yes [provider]  potassium chloride SA (K-DUR,KLOR-CON) 20 MEQ tablet TAKE  1 TABLET(20 MEQ) BY MOUTH DAILY 11/06/17  Yes Camnitz, Ocie Doyne, MD  HYDROcodone-acetaminophen (NORCO) 10-325 MG tablet Take 1 tablet by mouth every 6 (six) hours as needed. Patient not taking: Reported on 11/13/2017 10/24/17   Ileana Roup, MD  XARELTO 20 MG TABS tablet Take 1 tablet (20 mg total) by mouth daily. 11/13/17   Camnitz, Ocie Doyne, MD    Family History Family History  Problem Relation Age of Onset  . Heart attack Father 33    Social History Social History   Tobacco Use  . Smoking status: Never Smoker  . Smokeless tobacco: Never Used  Substance Use Topics  . Alcohol use: No    Comment: 2 beers a year  . Drug use: No     Allergies   Patient has no known allergies.   Review of Systems Review of Systems  Constitutional: Positive for fatigue. Negative for fever.  Gastrointestinal: Positive for blood in stool.  All other systems reviewed and are negative.    Physical Exam Updated Vital Signs BP (!) 109/57 (BP Location: Left Arm)   Pulse 70   Temp 97.6 F (36.4 C) (Oral)   Resp 18   Ht 5\' 8"  (1.727 m)   Wt 105.7 kg (233 lb)   SpO2 98%   BMI 35.43 kg/m    Physical Exam  Constitutional: He is oriented to person, place, and time. He appears well-developed and well-nourished. No distress.  HENT:  Head: Normocephalic and atraumatic.  Eyes: Conjunctivae are normal.  Neck: Neck supple.  Cardiovascular: Normal rate, regular rhythm and normal heart sounds. Exam reveals no friction rub.  No murmur heard. Pulmonary/Chest: Effort normal and breath sounds normal. No respiratory distress. He has no wheezes. He has no rales.  Abdominal: He exhibits no distension.  Musculoskeletal: He exhibits no edema.  Neurological: He is alert and oriented to person, place, and time. He exhibits normal muscle tone.  Skin: Skin is warm. Capillary refill takes less than 2 seconds.  Psychiatric: He has a normal mood and affect.  Nursing note and vitals reviewed.    ED Treatments / Results  Labs (all labs ordered are listed, but only abnormal results are displayed) Labs Reviewed  COMPREHENSIVE METABOLIC PANEL - Abnormal; Notable for the following components:      Result Value   Glucose, Bld 104 (*)    BUN 27 (*)    Creatinine, Ser 1.44 (*)    GFR calc non Af Amer 47 (*)    GFR calc Af Amer 55 (*)    All other components within normal limits  CBC - Abnormal; Notable for the following components:   RBC 3.94 (*)    Hemoglobin 12.7 (*)    HCT 38.3 (*)    All other components within normal limits  PROTIME-INR - Abnormal; Notable for the following components:   Prothrombin Time 25.7 (*)    All other components within normal limits  CBC - Abnormal; Notable for the following components:   RBC 4.01 (*)    All other components within normal limits  BASIC METABOLIC PANEL  CBC  TYPE AND SCREEN  ABO/RH    EKG  EKG Interpretation None       Radiology No results found.  Procedures Procedures (including critical care time)  Medications Ordered in ED Medications  atorvastatin (LIPITOR) tablet 40 mg (not administered)  carvedilol (COREG) tablet 25 mg  (25 mg Oral Given 11/13/17 1703)  ferrous sulfate tablet 325 mg (not administered)  furosemide (  LASIX) tablet 40 mg (40 mg Oral Not Given 11/13/17 1413)  lisinopril (PRINIVIL,ZESTRIL) tablet 40 mg (not administered)  pantoprazole (PROTONIX) EC tablet 40 mg (not administered)  potassium chloride SA (K-DUR,KLOR-CON) CR tablet 20 mEq (not administered)  polyvinyl alcohol (LIQUIFILM TEARS) 1.4 % ophthalmic solution 1 drop (1 drop Right Eye Not Given 11/13/17 1413)  ondansetron (ZOFRAN) tablet 4 mg (not administered)    Or  ondansetron (ZOFRAN) injection 4 mg (not administered)  diphenhydrAMINE (BENADRYL) capsule 25 mg (not administered)    And  acetaminophen (TYLENOL) tablet 500 mg (not administered)     Initial Impression / Assessment and Plan / ED Course  I have reviewed the triage vital signs and the nursing notes.  Pertinent labs & imaging results that were available during my care of the patient were reviewed by me and considered in my medical decision making (see chart for details).     73 year old male with past medical history as above who presents with rectal bleeding.  He is status post hemorrhoidectomy.  Dr. Dema Severin has seen the patient in the ED.  No surgical intervention needed but he recommends hospitalist admission with holding of Xarelto.  He will see the patient in the morning.  Patient updated and in agreement.  Final Clinical Impressions(s) / ED Diagnoses   Final diagnoses:  Hematochezia    ED Discharge Orders    None       Duffy Bruce, MD 11/13/17 [phone]

## 2017-11-13 NOTE — H&P (Addendum)
History and Physical    NOTNAMED SCHOLZ UYQ:[id number] DOB: 1946/05/23 DOA: 11/13/2017  Referring MD/NP/PA: Voncille Lo  PCP: [name], MD   Patient coming from: home  Chief Complaint: bright red blood per rectum   HPI: Peter [name] is a 72 y.o. male with known atrial fibrillation on Xarelto, HTN, HLD, CAD and s/p drug eluting stent x3 placed in 2006, CKD stage III, recent hemorrhoidectomy on 12/14 by Dr. Dema Severin, presented with main concern of sudden onset of large amount of BRBPR in the past 24 hours. Pt denies any chest pain or dyspnea, no palpitations, no abd or urinary concerns. He reports he was back on Xarelto as he was recommended to start post surgery.   ED Course: Pt is hemodynamically stable, VSS, blood work stable. Admit for observation.   Review of Systems:  Constitutional: Negative for fever, chills, diaphoresis, activity change, appetite change HENT: Negative for ear pain, nosebleeds, congestion, facial swelling, rhinorrhea, neck pain, neck stiffness and ear discharge.   Eyes: Negative for pain, discharge, redness, itching and visual disturbance.  Respiratory: Negative for cough, choking, chest tightness, shortness of breath, wheezing and stridor.   Cardiovascular: Negative for chest pain, palpitations and leg swelling.  Gastrointestinal: Negative for abdominal distention.  Genitourinary: Negative for dysuria, urgency, frequency, hematuria, flank pain, decreased urine volume, difficulty urinating and dyspareunia.  Musculoskeletal: Negative for back pain, joint swelling, arthralgias and gait problem.  Neurological: Negative for dizziness, tremors, seizures, syncope, facial asymmetry, speech difficulty, weakness, light-headedness, numbness and headaches.  Hematological: Negative for adenopathy. Does not bruise/bleed easily.  Psychiatric/Behavioral: Negative for hallucinations, behavioral problems, confusion, dysphoric mood, decreased concentration and agitation.    Past Medical History:  Diagnosis Date  . At risk for sleep apnea    STOP-BANG= 5       SENT TO PCP 07-12-2015  . CAD (coronary artery disease)    a. s/p PCI with DES x 3 to LAD in 2006 with bifurcation lesion and Plavix indefinitely was recommended.  . Chronic combined systolic and diastolic CHF (congestive heart failure) ([place])    a. Prior EF normal but EF dropped to 30-35% in 07/2016 in setting of AFIB.  . CKD (chronic kidney disease), stage III ([place])   . Dyslipidemia   . Dyspnea    tired quick d/t Afib  . Dyspnea on exertion   . GERD (gastroesophageal reflux disease)   . History of GI bleed   . History of kidney stones   . History of MI (myocardial infarction)    11-05-2005--  periprocedural MI  (cardiac cath) per dr [name] note  . Hypertension   . Hypertriglyceridemia   . Iron deficiency anemia   . Myocardial infarction ([place])   . Persistent atrial fibrillation ([place])    a. 07/2016, started on Eliquis -> readmitted later that month for AF RVR/acute HF - initially unsuccessful DCCV following TEE, so loaded with amio with repeat successful DCCV 07/2816.  . Right ureteral stone   . S/P drug eluting coronary stent placement    x3  to LAD  2006  . Sleep apnea    wears BIPAP, 14 up to 24    Past Surgical History:  Procedure Laterality Date  . CARDIOVASCULAR STRESS TEST  01-06-2013  dr [name]   normal perfusion study/  no ischemia or infarct/  normal LV function and wall motion , ef 57%  . CARDIOVERSION N/A 08/05/2016   Procedure: CARDIOVERSION;  Surgeon: Sanda Klein, MD;  Location: [facility];  Service: Cardiovascular;  Laterality: N/A;  . CARDIOVERSION N/A 08/08/2016   Procedure: CARDIOVERSION;  Surgeon: Jerline Pain, MD;  Location: [facility] ENDOSCOPY;  Service: Cardiovascular;  Laterality: N/A;  . CORONARY ANGIOPLASTY  11-13-2005 dr [name]   Successful touch up post-dilatation of the 3 tandem overlying Cypher stents in proximal to mid LAD (based on IVUS 0% stenosis and patent  diagonal branch)/  Cutting Balloon Angioplasty of Ramus branch  . CORONARY ANGIOPLASTY WITH STENT PLACEMENT  11-05-2005   dr [name]   PCI and DES x3 to birfurcation lesion LAD and diagonal branch of LAD/   20% dLM,  30-40% RCA, ef 60%  . CYSTOSCOPY WITH RETROGRADE PYELOGRAM, URETEROSCOPY AND STENT PLACEMENT Right 07/14/2015   Procedure: CYSTOSCOPY WITH RETROGRADE PYELOGRAM, URETEROSCOPY AND STENT PLACEMENT;  Surgeon: Alexis Frock, MD;  Location: [facility];  Service: Urology;  Laterality: Right;  . ELECTROPHYSIOLOGIC STUDY N/A 10/24/2016   Procedure: Atrial Fibrillation Ablation;  Surgeon: Will Meredith Leeds, MD;  Location: [place] CV LAB;  Service: Cardiovascular;  Laterality: N/A;  . EXTRACORPOREAL SHOCK WAVE LITHOTRIPSY  yrs ago  . HEMORRHOID SURGERY N/A 10/24/2017   Procedure: HEMORRHOIDECTOMY;  Surgeon: Ileana Roup, MD;  Location: [place];  Service: General;  Laterality: N/A;  . IR GENERIC HISTORICAL  10/23/2016   IR FLUORO GUIDE CV LINE RIGHT 10/23/2016 Greggory Keen, MD [facility]  . IR GENERIC HISTORICAL  10/23/2016   IR US GUIDE VASC ACCESS RIGHT 10/23/2016 Greggory Keen, MD [facility]  . LEFT URETEROSCOPIC STONE EXTRACTION  04-10-2006  . STONE EXTRACTION WITH BASKET Right 07/14/2015   Procedure: STONE EXTRACTION WITH BASKET;  Surgeon: Alexis Frock, MD;  Location: [facility];  Service: Urology;  Laterality: Right;  . TEE WITHOUT CARDIOVERSION N/A 08/05/2016   Procedure: TRANSESOPHAGEAL ECHOCARDIOGRAM (TEE);  Surgeon: Sanda Klein, MD;  Location: [facility] ENDOSCOPY;  Service: Cardiovascular;  Laterality: N/A;  . TRANSTHORACIC ECHOCARDIOGRAM  02-04-2012   grade I diastolic dysfunction/  ef 55-60%/  trivial MR and TR/  mild LAE   Social Hx:  reports that  has never smoked. he has never used smokeless tobacco. He reports that he does not drink alcohol or use drugs.  No Known Allergies  Family History  Problem Relation Age of Onset   . Heart attack Father 4    Medication Sig  atorvastatin (LIPITOR) 40 MG tablet TAKE 1 TABLET(40 MG) BY MOUTH EVERY MORNING  carvedilol (COREG) 25 MG tablet Take 1 tablet (25 mg total) by mouth 2 (two) times daily with a meal.  ferrous sulfate 325 (65 FE) MG tablet Take 1 tablet (325 mg total) by mouth every morning.  furosemide (LASIX) 40 MG tablet TAKE 1 TABLET(40 MG) BY MOUTH DAILY  lisinopril (PRINIVIL,ZESTRIL) 40 MG tablet TAKE 1 TABLET(40 MG) BY MOUTH DAILY  nitroGLYCERIN (NITROSTAT) 0.4 MG SL tablet Place 1 tablet (0.4 mg total) under the tongue every 5 (five) minutes as needed for chest pain. X 3 doses  pantoprazole (PROTONIX) 40 MG tablet Take 1 tablet (40 mg total) by mouth daily.  potassium chloride SA (K-DUR,KLOR-CON) 20 MEQ tablet TAKE 1 TABLET(20 MEQ) BY MOUTH DAILY  XARELTO 20 MG TABS tablet Take 1 tablet (20 mg total) by mouth daily.    Physical Exam: Vitals:   11/13/17 1003 11/13/17 1005 11/13/17 1052 11/13/17 1219  BP:  130/85 (!) 143/83 133/83  Pulse:  77 65 71  Resp:  18  18  Temp:  (!) 97.5 F (36.4 C)    TempSrc:  Oral  SpO2:  98% 97% 97%  Weight: 105.7 kg (233 lb)     Height: 5\' 8"  (1.727 m)       Constitutional: NAD, calm, comfortable Vitals:   11/13/17 1003 11/13/17 1005 11/13/17 1052 11/13/17 1219  BP:  130/85 (!) 143/83 133/83  Pulse:  77 65 71  Resp:  18  18  Temp:  (!) 97.5 F (36.4 C)    TempSrc:  Oral    SpO2:  98% 97% 97%  Weight: 105.7 kg (233 lb)     Height: 5\' 8"  (1.727 m)      Eyes: PERRL, lids and conjunctivae normal ENMT: Mucous membranes are moist. Posterior pharynx clear of any exudate or lesions.Normal dentition.  Neck: normal, supple, no masses, no thyromegaly Respiratory: clear to auscultation bilaterally, no wheezing, no crackles. Normal respiratory effort. No accessory muscle use.  Cardiovascular: Irregular rate and rhythm, no rubs / gallops. No extremity edema. 2+ pedal pulses. No carotid bruits.  Abdomen: no tenderness,  no masses palpated. No hepatosplenomegaly. Bowel sounds positive.  Musculoskeletal: no clubbing / cyanosis. No joint deformity upper and lower extremities. Good ROM, no contractures. Normal muscle tone.  Skin: no rashes, lesions, ulcers. No induration Neurologic: CN 2-12 grossly intact. Sensation intact, DTR normal. Strength 5/5 in all 4.  Psychiatric: Normal judgment and insight. Alert and oriented x 3. Normal mood.    Labs on Admission: I have personally reviewed following labs and imaging studies  CBC: Recent Labs  Lab 11/13/17 1052  WBC 5.9  HGB 12.7*  HCT 38.3*  MCV 97.2  PLT 517   Basic Metabolic Panel: Recent Labs  Lab 11/13/17 1052  NA 141  K 4.1  CL 109  CO2 25  GLUCOSE 104*  BUN 27*  CREATININE 1.44*  CALCIUM 9.3   Liver Function Tests: Recent Labs  Lab 11/13/17 1052  AST 22  ALT 20  ALKPHOS 71  BILITOT 1.0  PROT 6.7  ALBUMIN 3.9   Coagulation Profile: Recent Labs  Lab 11/13/17 1052  INR 2.37   Radiological Exams on Admission: No results found.  EKG: pending   Assessment/Plan Active Problems:   Acute blood loss anemia, BRBPR - in the setting of recent hemorrhoidectomy and use of Xarelto  - Hg overall stable for now but agree with admission for closer monitoring - will repeat Hg today and again in AM to make sure its stable - holding Xarelto - currently no need for transfusion  - appreciate surgery team input     Hemorrhoidectomy (done 10/24/2017) - appreciate surgery team following - per recommendations, will hold Xarelto for about 7 days to allow complete healing     Atrial fibrillation, persistent  - rate controlled - continue Carvedilol and hold Xarelto as noted above - no need for cardiac monitoring at this time as pt is clinically stable, rate controlled and no CP    Chronic combined CHF - last ECHO in 11/2016 with stable EF 60% - in 2017 EF was as low as 30% - currently no signs of volume overload - continue home regimen  with lasix     HTN - continue home regimen with Lasix, Lisinopril, carvedilol     HLD - continue Atorvastatin    CKD stage III - review of records from at least one year back indicated Cr at baseline 1.3 - 1.4 - Cr is currently at baseline     Obesity  - Body mass index is 35.43 kg/m.  DVT prophylaxis: SCD's Code Status: Full  Family Communication: Pt updated at bedside Disposition Plan: home in 24-48 hours  Consults called: surgery  Admission status: observation   Faye Ramsay MD [facility] Pager [phone]  If 7PM-7AM, please contact night-coverage [email or web address] Password [facility]  11/13/2017, 12:32 PM

## 2017-11-13 NOTE — ED Triage Notes (Signed)
Patient reports that he had hemorrhoid surgery on 14th of December and had some bleeding since but yesterday had lots of rectal bleeding and about every hour reports he has to change underwear, having to wear pads and clean himself up. Denies having any hard stool or having to strain. Patient is taking Xeralto. Denies any pain.

## 2017-11-13 NOTE — Progress Notes (Addendum)
Peter [name] is well known to me. 72yo gentleman hx HTN, HLD, GERD, CKD, CAD, Afib s/p ablation, CHF/depressed EF which is now back to normal following ablation and lasix - takes Xarelto; s/p 2 column hemorrhoidectomy 10/24/17 (path benign) for bleeding hemorrhoids - recovered well from this and 1 week out restarted his Xarelto (12/21). At the time of restarting his Xarelto he no bleeding per rectum. A few days later he noticed a small amount of blood on the tissue paper after a bowel movement. Starting yesterday he noticed more bleeding and was wearing a pad. He was changing the pad every 2-4 hours. He called our office this morning and was told to come into the ER for further evaluation.  He denies dizziness/weakness/fatigue at this time He denies significant perianal pain  Objective: Vital signs in last 24 hours: Temp:  [97.5 F (36.4 C)] 97.5 F (36.4 C) (01/03 1005) Pulse Rate:  [77] 77 (01/03 1005) Resp:  [18] 18 (01/03 1005) BP: (130)/(85) 130/85 (01/03 1005) SpO2:  [98 %] 98 % (01/03 1005) Weight:  [105.7 kg (233 lb)] 105.7 kg (233 lb) (01/03 1003)     Gen: NAD, comfortable CV: RRR Pulm: Normal work of breathing Abd: Soft, NT Anorectal: Unable to tolerate an anoscopic exam but externally, the right posterior bundle incision has partially opened. There is no evidence of active bleeding at this time. There was dried blood on the gluteal cleft. The pad he was wearing was clean/dry. I placed two 4x4 gauze pads in the gluteal cleft to provide some manual pressure to the area.  Lab Results: Pending  Assessment/Plan: Patient Active Problem List   Diagnosis Date Noted  . AF (atrial fibrillation) ([place]) 10/24/2016  . Obesity 08/09/2016  . Atrial fibrillation with RVR ([place]) 08/03/2016  . New onset a-fib ([place]) 07/16/2016  . Persistent atrial fibrillation ([place]) 07/16/2016  . Acute systolic (congestive) heart failure ([place])   . Ejection fraction   . Iron deficiency anemia due to chronic  blood loss 02/04/2012  . Anemia 02/03/2012  . CKD (chronic kidney disease), stage III ([place]) 02/03/2012  . DOE (dyspnea on exertion) 01/28/2012  . Hypertension   . Coronary artery disease due to lipid rich plaque   . Dyslipidemia   . GERD (gastroesophageal reflux disease)    71yoM hx HTN, HLD, GERD, CKD, CAD, ?CHF, Afib/ablation now s/p 2 column hemorrhoidectomy 10/24/17 now with some oozing from the surgical bed. Last dose of Xarelto was last night  -CBC/CMP/coags/type & screen -Observation overnight tonight -Nothing actively bleeding per se at this time but given the amount of bleeding he has had over the last day by his description and increased amount from baseline, I believe the safest thing to do is hold the Xarelto, monitor overnight tonight and repeat CBC -Will anticipate holding his Xarelto for another 7 days to allow the area to close some more -Continue daily fiber supplement (metamucil)  Sharon Mt. Dema Severin, M.D. General and Colorectal Surgery [facility]

## 2017-11-14 DIAGNOSIS — K625 Hemorrhage of anus and rectum: Secondary | ICD-10-CM | POA: Diagnosis not present

## 2017-11-14 DIAGNOSIS — D62 Acute posthemorrhagic anemia: Secondary | ICD-10-CM | POA: Diagnosis not present

## 2017-11-14 DIAGNOSIS — K921 Melena: Secondary | ICD-10-CM | POA: Diagnosis not present

## 2017-11-14 LAB — CBC
HCT: 36.8 % — ABNORMAL LOW (ref 39.0–52.0)
Hemoglobin: 12.2 g/dL — ABNORMAL LOW (ref 13.0–17.0)
MCH: 32.5 pg (ref 26.0–34.0)
MCHC: 33.2 g/dL (ref 30.0–36.0)
MCV: 98.1 fL (ref 78.0–100.0)
PLATELETS: 162 10*3/uL (ref 150–400)
RBC: 3.75 MIL/uL — AB (ref 4.22–5.81)
RDW: 12.8 % (ref 11.5–15.5)
WBC: 6.9 10*3/uL (ref 4.0–10.5)

## 2017-11-14 LAB — BASIC METABOLIC PANEL
Anion gap: 7 (ref 5–15)
BUN: 26 mg/dL — ABNORMAL HIGH (ref 6–20)
CHLORIDE: 109 mmol/L (ref 101–111)
CO2: 23 mmol/L (ref 22–32)
CREATININE: 1.32 mg/dL — AB (ref 0.61–1.24)
Calcium: 9 mg/dL (ref 8.9–10.3)
GFR calc non Af Amer: 52 mL/min — ABNORMAL LOW (ref >60)
Glucose, Bld: 104 mg/dL — ABNORMAL HIGH (ref 65–99)
POTASSIUM: 3.9 mmol/L (ref 3.5–5.1)
SODIUM: 139 mmol/L (ref 135–145)

## 2017-11-14 MED ORDER — XARELTO 20 MG PO TABS: 20.0000 mg | ORAL_TABLET | Freq: Every day | ORAL | 0 refills | Status: DC

## 2017-11-14 NOTE — Progress Notes (Signed)
Subjective No acute events. Has had a pad in place since 7:30pm - no bleeding and pad dry this morning.  Objective: Vital signs in last 24 hours: Temp:  [97.5 F (36.4 C)-98.3 F (36.8 C)] 98.1 F (36.7 C) (01/04 0548) Pulse Rate:  [59-77] 59 (01/04 0548) Resp:  [16-20] 16 (01/04 0548) BP: (109-149)/(57-92) 149/83 (01/04 0548) SpO2:  [95 %-100 %] 95 % (01/04 0548) Weight:  [105.7 kg (233 lb)] 105.7 kg (233 lb) (01/03 1003)    Gen: NAD, comfortable CV: RRR Pulm: Normal work of breathing Anorectal: No evidence of active bleeding; pad dry Ext: SCDs in place  Lab Results: CBC  Recent Labs    11/13/17 1737 11/14/17 0548  WBC 8.3 6.9  HGB 13.2 12.2*  HCT 39.5 36.8*  PLT 218 162   BMET Recent Labs    11/13/17 1052 11/14/17 0548  NA 141 139  K 4.1 3.9  CL 109 109  CO2 25 23  GLUCOSE 104* 104*  BUN 27* 26*  CREATININE 1.44* 1.32*  CALCIUM 9.3 9.0   PT/INR Recent Labs    11/13/17 1052  LABPROT 25.7*  INR 2.37    Assessment/Plan: Patient Active Problem List   Diagnosis Date Noted  . Acute blood loss anemia 11/13/2017  . AF (atrial fibrillation) ([place]) 10/24/2016  . Obesity 08/09/2016  . Atrial fibrillation with RVR ([place]) 08/03/2016  . New onset a-fib ([place]) 07/16/2016  . Persistent atrial fibrillation ([place]) 07/16/2016  . Acute systolic (congestive) heart failure ([place])   . Ejection fraction   . Iron deficiency anemia due to chronic blood loss 02/04/2012  . Anemia 02/03/2012  . CKD (chronic kidney disease), stage III ([place]) 02/03/2012  . DOE (dyspnea on exertion) 01/28/2012  . Hypertension   . Coronary artery disease due to lipid rich plaque   . Dyslipidemia   . GERD (gastroesophageal reflux disease)    71yoM hx HTN, HLD, GERD, CKD, CAD, ?CHF, Afib/ablation now s/p 2 column hemorrhoidectomy 10/24/17 admitted for oozing from the surgical bed in the setting of Xarelto. Last dose of Xarelto was 36hrs ago  -No further bleeding; hgb more or less stable over  last 24hrs - ok for discharge from our standpoint -Appreciate medicine service's involvement/assistance with his care -Will anticipate holding his Xarelto until next Friday (11/21/17) -Continue daily fiber supplement (metamucil) indefinitely  Sharon Mt. Dema Severin, M.D. General and Colorectal Surgery [facility]

## 2017-11-17 NOTE — Discharge Summary (Addendum)
Physician Discharge Summary  Peter [name] MVE:[id number] DOB: August 16, 1946 DOA: 11/13/2017  PCP: [name], MD  Admit date: 11/13/2017 Discharge date: 11/14/2017  Admitted From: Home.  Disposition:  Home.   Recommendations for Outpatient Follow-up:  1. Follow up with PCP in 1-2 weeks 2. Please obtain BMP/CBC in one week Please follow up with surgery if recurrent bleeding occurs.  Surgery recommends holding xarelto for one week.    Discharge Condition:stable.  CODE STATUS: full code.  Diet recommendation: Heart Healthy   Brief/Interim Summary: Peter [name] is a 72 y.o. male with known atrial fibrillation on Xarelto, HTN, HLD, CAD and s/p drug eluting stent x3 placed in 2006, CKD stage III, recent hemorrhoidectomy on 12/14 by Dr. Dema Severin, presented with main concern of sudden onset of large amount of BRBPR  for one day.  Pt denies any chest pain or dyspnea, no palpitations, no abd or urinary concerns. He reports he was back on Xarelto as he was recommended to start post surgery.    Discharge Diagnoses:  Active Problems:   Acute blood loss anemia   Acute blood loss anemia, BRBPR - in the setting of recent hemorrhoidectomy and use of Xarelto  - Hg overall stable for now  -repeat hemoglobin is stable.  - holding Xarelto for one week as per surgery recommendations.  - currently no need for transfusion  - appreciate surgery team input     Hemorrhoidectomy (done 10/24/2017) - appreciate surgery team following - per recommendations, will hold Xarelto for about 7 days to allow complete healing     Atrial fibrillation, persistent  - rate controlled - continue Carvedilol and hold Xarelto for one week as recommended by surgery.      Chronic combined CHF - last ECHO in 11/2016 with stable EF 60% - in 2017 EF was as low as 30% - currently no signs of volume overload - continue home regimen with lasix     HTN - continue home regimen with Lasix, Lisinopril, carvedilol    HLD - continue Atorvastatin    CKD stage III - review of records from at least one year back indicated Cr at baseline 1.3 - 1.4 - Cr is currently at baseline     Obesity  - Body mass index is 35.43 kg/m.    Discharge Instructions  Discharge Instructions    Diet - low sodium heart healthy   Complete by:  As directed    Discharge instructions   Complete by:  As directed    Please follow up with PCP in one week.  Please follow up with surgery as needed.     Allergies as of 11/14/2017   No Known Allergies     Medication List    STOP taking these medications   HYDROcodone-acetaminophen 10-325 MG tablet Commonly known as:  [place] these medications   atorvastatin 40 MG tablet Commonly known as:  LIPITOR TAKE 1 TABLET(40 MG) BY MOUTH EVERY MORNING   carvedilol 25 MG tablet Commonly known as:  COREG Take 1 tablet (25 mg total) by mouth 2 (two) times daily with a meal.   diphenhydramine-acetaminophen 25-500 MG Tabs tablet Commonly known as:  TYLENOL PM Take 1 tablet by mouth at bedtime.   ferrous sulfate 325 (65 FE) MG tablet Take 1 tablet (325 mg total) by mouth every morning.   furosemide 40 MG tablet Commonly known as:  LASIX TAKE 1 TABLET(40 MG) BY MOUTH DAILY   lisinopril 40 MG tablet Commonly known  as:  PRINIVIL,ZESTRIL TAKE 1 TABLET(40 MG) BY MOUTH DAILY   nitroGLYCERIN 0.4 MG SL tablet Commonly known as:  NITROSTAT Place 1 tablet (0.4 mg total) under the tongue every 5 (five) minutes as needed for chest pain. X 3 doses   pantoprazole 40 MG tablet Commonly known as:  PROTONIX Take 1 tablet (40 mg total) by mouth daily.   polyvinyl alcohol 1.4 % ophthalmic solution Commonly known as:  LIQUIFILM TEARS Place 1 drop into the right eye daily.   potassium chloride SA 20 MEQ tablet Commonly known as:  K-DUR,KLOR-CON TAKE 1 TABLET(20 MEQ) BY MOUTH DAILY   XARELTO 20 MG Tabs tablet Generic drug:  rivaroxaban Take 1 tablet (20 mg total) by mouth  daily. Please hold xarelto for one week. Start taking on:  11/21/2017 What changed:    additional instructions  These instructions start on 11/21/2017. If you are unsure what to do until then, ask your doctor or other care provider.      Follow-up Information    Koirala, [name], MD. Schedule an appointment as soon as possible for a visit in 1 week(s).   Specialty:  Family Medicine Why:  check CBC on monday Contact information: [place] [address] [phone]          No Known Allergies  Consultations:  Surgery.    Procedures/Studies:  No results found.    Subjective: No new complaints.   Discharge Exam: Vitals:   11/14/17 0924 11/14/17 1218  BP: (!) 151/95 132/81  Pulse: 67 73  Resp: 18 20  Temp: 97.9 F (36.6 C) 98.2 F (36.8 C)  SpO2: 100% 97%   Vitals:   11/13/17 2123 11/14/17 0548 11/14/17 0924 11/14/17 1218  BP: (!) 137/92 (!) 149/83 (!) 151/95 132/81  Pulse: 64 (!) 59 67 73  Resp: 20 16 18 20   Temp: 98.3 F (36.8 C) 98.1 F (36.7 C) 97.9 F (36.6 C) 98.2 F (36.8 C)  TempSrc: Oral Oral Oral Oral  SpO2: 100% 95% 100% 97%  Weight:      Height:        General: Pt is alert, awake, not in acute distress Cardiovascular: RRR, S1/S2 +, no rubs, no gallops Respiratory: CTA bilaterally, no wheezing, no rhonchi Abdominal: Soft, NT, ND, bowel sounds + Extremities: no edema, no cyanosis    The results of significant diagnostics from this hospitalization (including imaging, microbiology, ancillary and laboratory) are listed below for reference.     Microbiology: No results found for this or any previous visit (from the past 240 hour(s)).   Labs: BNP (last 3 results) No results for input(s): BNP in the last 8760 hours. Basic Metabolic Panel: Recent Labs  Lab 11/13/17 1052 11/14/17 0548  NA 141 139  K 4.1 3.9  CL 109 109  CO2 25 23  GLUCOSE 104* 104*  BUN 27* 26*  CREATININE 1.44* 1.32*  CALCIUM 9.3  9.0   Liver Function Tests: Recent Labs  Lab 11/13/17 1052  AST 22  ALT 20  ALKPHOS 71  BILITOT 1.0  PROT 6.7  ALBUMIN 3.9   No results for input(s): LIPASE, AMYLASE in the last 168 hours. No results for input(s): AMMONIA in the last 168 hours. CBC: Recent Labs  Lab 11/13/17 1052 11/13/17 1737 11/14/17 0548  WBC 5.9 8.3 6.9  HGB 12.7* 13.2 12.2*  HCT 38.3* 39.5 36.8*  MCV 97.2 98.5 98.1  PLT 169 218 162   Cardiac Enzymes: No results for input(s): CKTOTAL,  CKMB, CKMBINDEX, TROPONINI in the last 168 hours. BNP: Invalid input(s): POCBNP CBG: No results for input(s): GLUCAP in the last 168 hours. D-Dimer No results for input(s): DDIMER in the last 72 hours. Hgb A1c No results for input(s): HGBA1C in the last 72 hours. Lipid Profile No results for input(s): CHOL, HDL, LDLCALC, TRIG, CHOLHDL, LDLDIRECT in the last 72 hours. Thyroid function studies No results for input(s): TSH, T4TOTAL, T3FREE, THYROIDAB in the last 72 hours.  Invalid input(s): FREET3 Anemia work up No results for input(s): VITAMINB12, FOLATE, FERRITIN, TIBC, IRON, RETICCTPCT in the last 72 hours. Urinalysis No results found for: COLORURINE, APPEARANCEUR, LABSPEC, [place], GLUCOSEU, HGBUR, BILIRUBINUR, KETONESUR, PROTEINUR, UROBILINOGEN, NITRITE, LEUKOCYTESUR Sepsis Labs Invalid input(s): PROCALCITONIN,  WBC,  LACTICIDVEN Microbiology No results found for this or any previous visit (from the past 240 hour(s)).   Time coordinating discharge: Over 30 minutes  SIGNED:   Hosie Poisson, MD  [facility] 11/17/2017, 9:32 AM Pager   If 7PM-7AM, please contact night-coverage [email or web address] Password [email or web address]

## 2017-11-19 ENCOUNTER — Other Ambulatory Visit: Payer: Self-pay | Admitting: Cardiology

## 2017-11-19 MED ORDER — XARELTO 20 MG PO TABS: 20.0000 mg | ORAL_TABLET | Freq: Every day | ORAL | 3 refills | Status: DC

## 2017-11-19 NOTE — Telephone Encounter (Signed)
Pt pharmacy is requesting a refill on Xarelto. Please address

## 2017-11-20 ENCOUNTER — Other Ambulatory Visit: Payer: Self-pay | Admitting: Cardiology

## 2017-11-20 MED ORDER — ATORVASTATIN CALCIUM 40 MG PO TABS: ORAL_TABLET | ORAL | 3 refills | Status: DC

## 2017-12-02 DIAGNOSIS — M5137 Other intervertebral disc degeneration, lumbosacral region: Secondary | ICD-10-CM | POA: Diagnosis not present

## 2017-12-02 DIAGNOSIS — M9903 Segmental and somatic dysfunction of lumbar region: Secondary | ICD-10-CM | POA: Diagnosis not present

## 2017-12-02 DIAGNOSIS — M9906 Segmental and somatic dysfunction of lower extremity: Secondary | ICD-10-CM | POA: Diagnosis not present

## 2017-12-02 DIAGNOSIS — M9904 Segmental and somatic dysfunction of sacral region: Secondary | ICD-10-CM | POA: Diagnosis not present

## 2017-12-02 DIAGNOSIS — M5136 Other intervertebral disc degeneration, lumbar region: Secondary | ICD-10-CM | POA: Diagnosis not present

## 2017-12-03 DIAGNOSIS — M9906 Segmental and somatic dysfunction of lower extremity: Secondary | ICD-10-CM | POA: Diagnosis not present

## 2017-12-03 DIAGNOSIS — M9904 Segmental and somatic dysfunction of sacral region: Secondary | ICD-10-CM | POA: Diagnosis not present

## 2017-12-03 DIAGNOSIS — M9903 Segmental and somatic dysfunction of lumbar region: Secondary | ICD-10-CM | POA: Diagnosis not present

## 2017-12-03 DIAGNOSIS — M5137 Other intervertebral disc degeneration, lumbosacral region: Secondary | ICD-10-CM | POA: Diagnosis not present

## 2017-12-03 DIAGNOSIS — M5136 Other intervertebral disc degeneration, lumbar region: Secondary | ICD-10-CM | POA: Diagnosis not present

## 2017-12-04 DIAGNOSIS — M5136 Other intervertebral disc degeneration, lumbar region: Secondary | ICD-10-CM | POA: Diagnosis not present

## 2017-12-04 DIAGNOSIS — M9903 Segmental and somatic dysfunction of lumbar region: Secondary | ICD-10-CM | POA: Diagnosis not present

## 2017-12-04 DIAGNOSIS — M5137 Other intervertebral disc degeneration, lumbosacral region: Secondary | ICD-10-CM | POA: Diagnosis not present

## 2017-12-04 DIAGNOSIS — M9904 Segmental and somatic dysfunction of sacral region: Secondary | ICD-10-CM | POA: Diagnosis not present

## 2017-12-04 DIAGNOSIS — M9906 Segmental and somatic dysfunction of lower extremity: Secondary | ICD-10-CM | POA: Diagnosis not present

## 2017-12-09 DIAGNOSIS — G4733 Obstructive sleep apnea (adult) (pediatric): Secondary | ICD-10-CM | POA: Diagnosis not present

## 2017-12-29 ENCOUNTER — Other Ambulatory Visit: Payer: Self-pay | Admitting: Cardiology

## 2018-02-28 ENCOUNTER — Other Ambulatory Visit: Payer: Self-pay

## 2018-02-28 ENCOUNTER — Emergency Department (HOSPITAL_COMMUNITY): Payer: Medicare HMO

## 2018-02-28 ENCOUNTER — Encounter (HOSPITAL_COMMUNITY): Payer: Self-pay | Admitting: *Deleted

## 2018-02-28 ENCOUNTER — Inpatient Hospital Stay (HOSPITAL_COMMUNITY)
Admission: EM | Admit: 2018-02-28 | Discharge: 2018-03-03 | DRG: 392 | Disposition: A | Discharge status: Home / Self Care | Payer: Medicare HMO | Attending: Internal Medicine | Admitting: Internal Medicine

## 2018-02-28 DIAGNOSIS — Z6833 Body mass index (BMI) 33.0-33.9, adult: Secondary | ICD-10-CM

## 2018-02-28 DIAGNOSIS — Z8249 Family history of ischemic heart disease and other diseases of the circulatory system: Secondary | ICD-10-CM | POA: Diagnosis not present

## 2018-02-28 DIAGNOSIS — I252 Old myocardial infarction: Secondary | ICD-10-CM

## 2018-02-28 DIAGNOSIS — R1110 Vomiting, unspecified: Secondary | ICD-10-CM | POA: Diagnosis not present

## 2018-02-28 DIAGNOSIS — I130 Hypertensive heart and chronic kidney disease with heart failure and stage 1 through stage 4 chronic kidney disease, or unspecified chronic kidney disease: Secondary | ICD-10-CM | POA: Diagnosis present

## 2018-02-28 DIAGNOSIS — I5042 Chronic combined systolic (congestive) and diastolic (congestive) heart failure: Secondary | ICD-10-CM | POA: Diagnosis not present

## 2018-02-28 DIAGNOSIS — I2510 Atherosclerotic heart disease of native coronary artery without angina pectoris: Secondary | ICD-10-CM | POA: Diagnosis present

## 2018-02-28 DIAGNOSIS — I480 Paroxysmal atrial fibrillation: Secondary | ICD-10-CM | POA: Diagnosis present

## 2018-02-28 DIAGNOSIS — K5732 Diverticulitis of large intestine without perforation or abscess without bleeding: Secondary | ICD-10-CM | POA: Diagnosis not present

## 2018-02-28 DIAGNOSIS — K219 Gastro-esophageal reflux disease without esophagitis: Secondary | ICD-10-CM

## 2018-02-28 DIAGNOSIS — E785 Hyperlipidemia, unspecified: Secondary | ICD-10-CM | POA: Diagnosis present

## 2018-02-28 DIAGNOSIS — I481 Persistent atrial fibrillation: Secondary | ICD-10-CM | POA: Diagnosis not present

## 2018-02-28 DIAGNOSIS — I4891 Unspecified atrial fibrillation: Secondary | ICD-10-CM | POA: Diagnosis not present

## 2018-02-28 DIAGNOSIS — Z955 Presence of coronary angioplasty implant and graft: Secondary | ICD-10-CM | POA: Diagnosis not present

## 2018-02-28 DIAGNOSIS — R109 Unspecified abdominal pain: Secondary | ICD-10-CM | POA: Diagnosis not present

## 2018-02-28 DIAGNOSIS — N183 Chronic kidney disease, stage 3 (moderate): Secondary | ICD-10-CM | POA: Diagnosis present

## 2018-02-28 DIAGNOSIS — G4733 Obstructive sleep apnea (adult) (pediatric): Secondary | ICD-10-CM | POA: Diagnosis present

## 2018-02-28 DIAGNOSIS — Z7901 Long term (current) use of anticoagulants: Secondary | ICD-10-CM | POA: Diagnosis not present

## 2018-02-28 DIAGNOSIS — E781 Pure hyperglyceridemia: Secondary | ICD-10-CM | POA: Diagnosis not present

## 2018-02-28 DIAGNOSIS — E669 Obesity, unspecified: Secondary | ICD-10-CM | POA: Diagnosis present

## 2018-02-28 DIAGNOSIS — I5032 Chronic diastolic (congestive) heart failure: Secondary | ICD-10-CM | POA: Diagnosis not present

## 2018-02-28 DIAGNOSIS — R1031 Right lower quadrant pain: Secondary | ICD-10-CM | POA: Diagnosis not present

## 2018-02-28 DIAGNOSIS — K5792 Diverticulitis of intestine, part unspecified, without perforation or abscess without bleeding: Secondary | ICD-10-CM | POA: Diagnosis present

## 2018-02-28 DIAGNOSIS — N182 Chronic kidney disease, stage 2 (mild): Secondary | ICD-10-CM | POA: Diagnosis not present

## 2018-02-28 LAB — COMPREHENSIVE METABOLIC PANEL
ALK PHOS: 81 U/L (ref 38–126)
ALT: 15 U/L — AB (ref 17–63)
ANION GAP: 10 (ref 5–15)
AST: 15 U/L (ref 15–41)
Albumin: 3.9 g/dL (ref 3.5–5.0)
BUN: 21 mg/dL — ABNORMAL HIGH (ref 6–20)
CALCIUM: 10 mg/dL (ref 8.9–10.3)
CHLORIDE: 112 mmol/L — AB (ref 101–111)
CO2: 21 mmol/L — ABNORMAL LOW (ref 22–32)
CREATININE: 1.21 mg/dL (ref 0.61–1.24)
GFR, EST NON AFRICAN AMERICAN: 58 mL/min — AB (ref >60)
Glucose, Bld: 102 mg/dL — ABNORMAL HIGH (ref 65–99)
Potassium: 4.6 mmol/L (ref 3.5–5.1)
Sodium: 143 mmol/L (ref 135–145)
Total Bilirubin: 1.2 mg/dL (ref 0.3–1.2)
Total Protein: 7.7 g/dL (ref 6.5–8.1)

## 2018-02-28 LAB — CBC
HCT: 43.6 % (ref 39.0–52.0)
Hemoglobin: 14.5 g/dL (ref 13.0–17.0)
MCH: 32.1 pg (ref 26.0–34.0)
MCHC: 33.3 g/dL (ref 30.0–36.0)
MCV: 96.5 fL (ref 78.0–100.0)
PLATELETS: 167 10*3/uL (ref 150–400)
RBC: 4.52 MIL/uL (ref 4.22–5.81)
RDW: 13.4 % (ref 11.5–15.5)
WBC: 11.8 10*3/uL — AB (ref 4.0–10.5)

## 2018-02-28 LAB — URINALYSIS, ROUTINE W REFLEX MICROSCOPIC
Bilirubin Urine: NEGATIVE
Glucose, UA: NEGATIVE mg/dL
Hgb urine dipstick: NEGATIVE
Ketones, ur: 5 mg/dL — AB
LEUKOCYTES UA: NEGATIVE
Nitrite: NEGATIVE
PROTEIN: NEGATIVE mg/dL
Specific Gravity, Urine: 1.024 (ref 1.005–1.030)
pH: 5 (ref 5.0–8.0)

## 2018-02-28 LAB — LIPASE, BLOOD: LIPASE: 28 U/L (ref 11–51)

## 2018-02-28 MED ORDER — DIPHENHYDRAMINE-APAP (SLEEP) 25-500 MG PO TABS: 1.0000 | ORAL_TABLET | Freq: Every day | ORAL | Status: DC

## 2018-02-28 MED ORDER — PANTOPRAZOLE SODIUM 40 MG PO TBEC
40.0000 mg | DELAYED_RELEASE_TABLET | Freq: Every day | ORAL | Status: DC
  Administered 2018-03-01 – 2018-03-03 (×3): 40 mg via ORAL
  Filled 2018-02-28 (×3): qty 1

## 2018-02-28 MED ORDER — ACETAMINOPHEN 325 MG PO TABS
650.0000 mg | ORAL_TABLET | Freq: Four times a day (QID) | ORAL | Status: DC | PRN
  Administered 2018-03-02 (×3): 650 mg via ORAL
  Filled 2018-02-28 (×3): qty 2

## 2018-02-28 MED ORDER — SODIUM CHLORIDE 0.9 % IJ SOLN
INTRAMUSCULAR | Status: AC
  Administered 2018-02-28: 15:00:00
  Filled 2018-02-28: qty 50

## 2018-02-28 MED ORDER — LISINOPRIL 20 MG PO TABS
40.0000 mg | ORAL_TABLET | Freq: Every day | ORAL | Status: DC
  Administered 2018-03-01 – 2018-03-03 (×3): 40 mg via ORAL
  Filled 2018-02-28 (×4): qty 2

## 2018-02-28 MED ORDER — METRONIDAZOLE IN NACL 5-0.79 MG/ML-% IV SOLN
500.0000 mg | Freq: Once | INTRAVENOUS | Status: AC
  Administered 2018-02-28: 500 mg via INTRAVENOUS
  Filled 2018-02-28: qty 100

## 2018-02-28 MED ORDER — ATORVASTATIN CALCIUM 40 MG PO TABS
40.0000 mg | ORAL_TABLET | Freq: Every day | ORAL | Status: DC
  Filled 2018-02-28: qty 1

## 2018-02-28 MED ORDER — FUROSEMIDE 40 MG PO TABS
40.0000 mg | ORAL_TABLET | Freq: Every day | ORAL | Status: DC
  Filled 2018-02-28: qty 1

## 2018-02-28 MED ORDER — METRONIDAZOLE IN NACL 5-0.79 MG/ML-% IV SOLN
500.0000 mg | Freq: Three times a day (TID) | INTRAVENOUS | Status: DC
  Administered 2018-03-01 – 2018-03-03 (×7): 500 mg via INTRAVENOUS
  Filled 2018-02-28 (×7): qty 100

## 2018-02-28 MED ORDER — RIVAROXABAN 20 MG PO TABS
20.0000 mg | ORAL_TABLET | Freq: Every day | ORAL | Status: DC
  Administered 2018-02-28 – 2018-03-02 (×3): 20 mg via ORAL
  Filled 2018-02-28 (×3): qty 1

## 2018-02-28 MED ORDER — IOPAMIDOL (ISOVUE-300) INJECTION 61%
100.0000 mL | Freq: Once | INTRAVENOUS | Status: AC | PRN
  Administered 2018-02-28: 100 mL via INTRAVENOUS

## 2018-02-28 MED ORDER — ONDANSETRON HCL 4 MG/2ML IJ SOLN
4.0000 mg | Freq: Four times a day (QID) | INTRAMUSCULAR | Status: DC | PRN
  Administered 2018-02-28 – 2018-03-02 (×5): 4 mg via INTRAVENOUS
  Filled 2018-02-28 (×5): qty 2

## 2018-02-28 MED ORDER — CIPROFLOXACIN IN D5W 400 MG/200ML IV SOLN
400.0000 mg | Freq: Once | INTRAVENOUS | Status: AC
  Administered 2018-02-28: 400 mg via INTRAVENOUS
  Filled 2018-02-28: qty 200

## 2018-02-28 MED ORDER — ONDANSETRON HCL 4 MG/2ML IJ SOLN
4.0000 mg | Freq: Once | INTRAMUSCULAR | Status: AC
  Administered 2018-02-28: 4 mg via INTRAVENOUS
  Filled 2018-02-28: qty 2

## 2018-02-28 MED ORDER — IOPAMIDOL (ISOVUE-300) INJECTION 61%
INTRAVENOUS | Status: AC
  Filled 2018-02-28: qty 100

## 2018-02-28 MED ORDER — MORPHINE SULFATE (PF) 4 MG/ML IV SOLN
4.0000 mg | Freq: Once | INTRAVENOUS | Status: AC
  Administered 2018-02-28: 4 mg via INTRAVENOUS
  Filled 2018-02-28: qty 1

## 2018-02-28 MED ORDER — HYDROMORPHONE HCL 1 MG/ML IJ SOLN
1.0000 mg | INTRAMUSCULAR | Status: DC | PRN
Start: 2018-02-28 — End: 2018-03-03
  Administered 2018-02-28 – 2018-03-02 (×4): 1 mg via INTRAVENOUS
  Filled 2018-02-28 (×4): qty 1

## 2018-02-28 MED ORDER — FERROUS SULFATE 325 (65 FE) MG PO TABS
325.0000 mg | ORAL_TABLET | Freq: Every morning | ORAL | Status: DC
  Administered 2018-03-01 – 2018-03-03 (×3): 325 mg via ORAL
  Filled 2018-02-28 (×3): qty 1

## 2018-02-28 MED ORDER — HYDRALAZINE HCL 20 MG/ML IJ SOLN: 10.0000 mg | Freq: Four times a day (QID) | INTRAMUSCULAR | Status: DC | PRN

## 2018-02-28 MED ORDER — CARVEDILOL 25 MG PO TABS
25.0000 mg | ORAL_TABLET | Freq: Two times a day (BID) | ORAL | Status: DC
  Administered 2018-02-28 – 2018-03-03 (×6): 25 mg via ORAL
  Filled 2018-02-28 (×6): qty 1

## 2018-02-28 MED ORDER — CIPROFLOXACIN IN D5W 400 MG/200ML IV SOLN
400.0000 mg | Freq: Two times a day (BID) | INTRAVENOUS | Status: DC
  Administered 2018-03-01 – 2018-03-03 (×5): 400 mg via INTRAVENOUS
  Filled 2018-02-28 (×5): qty 200

## 2018-02-28 MED ORDER — SODIUM CHLORIDE 0.9 % IV BOLUS
1000.0000 mL | Freq: Once | INTRAVENOUS | Status: AC
  Administered 2018-02-28: 1000 mL via INTRAVENOUS

## 2018-02-28 MED ORDER — PROMETHAZINE HCL 25 MG/ML IJ SOLN
12.5000 mg | Freq: Once | INTRAMUSCULAR | Status: AC
  Administered 2018-02-28: 12.5 mg via INTRAVENOUS
  Filled 2018-02-28: qty 1

## 2018-02-28 NOTE — Progress Notes (Signed)
Pharmacy Antibiotic Note  Peter [name] is a 72 y.o. male admitted on 02/28/2018 with abdominal pain.  Pharmacy has been consulted for cipro dosing.  Plan: Cipro 400 mg IV q12 for diverticulitis Flagyl 500 IV q8h per MD Pharmacy to sign off  Height: 5\' 8"  (172.7 cm) Weight: 220 lb (99.8 kg) IBW/kg (Calculated) : 68.4  Temp (24hrs), Avg:98.4 F (36.9 C), Min:98.4 F (36.9 C), Max:98.4 F (36.9 C)  Recent Labs  Lab 02/28/18 1320  WBC 11.8*  CREATININE 1.21    Estimated Creatinine Clearance: 63.2 mL/min (by C-G formula based on SCr of 1.21 mg/dL).    No Known Allergies  Thank you for allowing pharmacy to be a part of this patient's care.  Eudelia Bunch, Pharm.D. [phone] 02/28/2018 3:44 PM

## 2018-02-28 NOTE — ED Notes (Signed)
Patient transported to CT 

## 2018-02-28 NOTE — ED Notes (Signed)
ED TO INPATIENT HANDOFF REPORT  Name/Age/Gender Peter [name] 72 y.o. male  Code Status    Code Status Orders  (From admission, onward)        Start     Ordered   02/28/18 1551  Full code  Continuous     02/28/18 1550    Code Status History    Date Active Date Inactive Code Status Order ID Comments User Context   11/13/2017 1346 11/14/2017 1618 Full Code [id number]  Theodis Blaze, MD Inpatient   10/24/2016 1616 10/25/2016 1932 Full Code [id number]  Constance Haw, MD Inpatient   08/03/2016 1532 08/09/2016 1636 Full Code [id number]  Willia Craze, NP ED   07/16/2016 1713 07/17/2016 2032 Full Code [id number]  Tawni Millers, MD Inpatient   02/03/2012 2248 02/05/2012 1534 Full Code [id number]  Roxan Diesel, RN Inpatient      Home/SNF/Other Home  Chief Complaint abd pain  Level of Care/Admitting Diagnosis ED Disposition    ED Disposition Condition [facility] Area: [facility] [[id number]]  Level of Care: Med-Surg [16]  Diagnosis: Diverticulitis [834196]  Admitting Physician: Kayleen Memos [[id number]]  Attending Physician: Kayleen Memos [[id number]]  Estimated length of stay: past midnight tomorrow  Certification:: I certify this patient will need inpatient services for at least 2 midnights  PT Class (Do Not Modify): Inpatient [101]  PT Acc Code (Do Not Modify): Private [1]       Medical History Past Medical History:  Diagnosis Date  . At risk for sleep apnea    STOP-BANG= 5       SENT TO PCP 07-12-2015  . CAD (coronary artery disease)    a. s/p PCI with DES x 3 to LAD in 2006 with bifurcation lesion and Plavix indefinitely was recommended.  . Chronic combined systolic and diastolic CHF (congestive heart failure) ([place])    a. Prior EF normal but EF dropped to 30-35% in 07/2016 in setting of AFIB.  . CKD (chronic kidney disease), stage III ([place])   . Dyslipidemia   . Dyspnea    tired quick d/t Afib  . Dyspnea on exertion    . GERD (gastroesophageal reflux disease)   . History of GI bleed   . History of kidney stones   . History of MI (myocardial infarction)    11-05-2005--  periprocedural MI  (cardiac cath) per dr [name] note  . Hypertension   . Hypertriglyceridemia   . Iron deficiency anemia   . Myocardial infarction ([place])   . Persistent atrial fibrillation ([place])    a. 07/2016, started on Eliquis -> readmitted later that month for AF RVR/acute HF - initially unsuccessful DCCV following TEE, so loaded with amio with repeat successful DCCV 07/2816.  . Right ureteral stone   . S/P drug eluting coronary stent placement    x3  to LAD  2006  . Sleep apnea    wears BIPAP, 14 up to 24    Allergies No Known Allergies  IV Location/Drains/Wounds Patient Lines/Drains/Airways Status   Active Line/Drains/Airways    Name:   Placement date:   Placement time:   Site:   Days:   Peripheral IV 02/28/18 Left Antecubital   02/28/18    1350    Antecubital   less than 1   Peripheral IV 02/28/18 Left Hand   02/28/18    1704    Hand   less than 1  Labs/Imaging Results for orders placed or performed during the hospital encounter of 02/28/18 (from the past 48 hour(s))  Urinalysis, Routine w reflex microscopic     Status: Abnormal   Collection Time: 02/28/18  1:09 PM  Result Value Ref Range   Color, Urine YELLOW YELLOW   APPearance CLEAR CLEAR   Specific Gravity, Urine 1.024 1.005 - 1.030   pH 5.0 5.0 - 8.0   Glucose, UA NEGATIVE NEGATIVE mg/dL   Hgb urine dipstick NEGATIVE NEGATIVE   Bilirubin Urine NEGATIVE NEGATIVE   Ketones, ur 5 (A) NEGATIVE mg/dL   Protein, ur NEGATIVE NEGATIVE mg/dL   Nitrite NEGATIVE NEGATIVE   Leukocytes, UA NEGATIVE NEGATIVE    Comment: Performed at [facility] [address], [place], [place] [address]  Lipase, blood     Status: None   Collection Time: 02/28/18  1:20 PM  Result Value Ref Range   Lipase 28 11 - 51 U/L    Comment: Performed at [facility], [place] [address], [place], [place] [address]  Comprehensive metabolic panel     Status: Abnormal   Collection Time: 02/28/18  1:20 PM  Result Value Ref Range   Sodium 143 135 - 145 mmol/L   Potassium 4.6 3.5 - 5.1 mmol/L   Chloride 112 (H) 101 - 111 mmol/L   CO2 21 (L) 22 - 32 mmol/L   Glucose, Bld 102 (H) 65 - 99 mg/dL   BUN 21 (H) 6 - 20 mg/dL   Creatinine, Ser 1.21 0.61 - 1.24 mg/dL   Calcium 10.0 8.9 - 10.3 mg/dL   Total Protein 7.7 6.5 - 8.1 g/dL   Albumin 3.9 3.5 - 5.0 g/dL   AST 15 15 - 41 U/L   ALT 15 (L) 17 - 63 U/L   Alkaline Phosphatase 81 38 - 126 U/L   Total Bilirubin 1.2 0.3 - 1.2 mg/dL   GFR calc non Af Amer 58 (L) >60 mL/min   GFR calc Af Amer >60 >60 mL/min    Comment: (NOTE) The eGFR has been calculated using the CKD EPI equation. This calculation has not been validated in all clinical situations. eGFR's persistently <60 mL/min signify possible Chronic Kidney Disease.    Anion gap 10 5 - 15    Comment: Performed at [facility], [place] [address], [place], [place] [address]  CBC     Status: Abnormal   Collection Time: 02/28/18  1:20 PM  Result Value Ref Range   WBC 11.8 (H) 4.0 - 10.5 K/uL   RBC 4.52 4.22 - 5.81 MIL/uL   Hemoglobin 14.5 13.0 - 17.0 g/dL   HCT 43.6 39.0 - 52.0 %   MCV 96.5 78.0 - 100.0 fL   MCH 32.1 26.0 - 34.0 pg   MCHC 33.3 30.0 - 36.0 g/dL   RDW 13.4 11.5 - 15.5 %   Platelets 167 150 - 400 K/uL    Comment: Performed at [facility], [place] [address], [place], [place] [address]   Ct Abdomen Pelvis W Contrast  Result Date: 02/28/2018 CLINICAL DATA:  Lower abdominal pain since Thursday. Nausea and vomiting. EXAM: CT ABDOMEN AND PELVIS WITH CONTRAST TECHNIQUE: Multidetector CT imaging of the abdomen and pelvis was performed using the standard protocol following bolus administration of intravenous contrast. CONTRAST:  122m ISOVUE-300 IOPAMIDOL (ISOVUE-300) INJECTION 61% COMPARISON:   06/12/2015 FINDINGS: Lower chest: Likely calcified nodule along the right major fissure at 2 mm. Mild cardiomegaly, without pericardial effusion. Right coronary  artery atherosclerosis. Moderate hiatal hernia. Probable dilatation of the thoracic duct including at 1.6 cm on image 2/2. Hepatobiliary: Normal liver. 2 mm gallstone without acute cholecystitis or biliary duct dilatation. Pancreas: Normal, without mass or ductal dilatation. Spleen: Normal in size, without focal abnormality. Adrenals/Urinary Tract: Normal adrenal glands. Mild renal cortical thinning bilaterally. Bilateral too small to characterize renal lesions. An upper pole left renal lesion measures slightly greater than fluid density and 1.7 cm on image 22/2. When compared to 06/12/2015, most consistent with a minimally complex cyst. No hydronephrosis. Bladder demonstrates equivocal wall thickening on image 77/2. Mild adjacent edema. Stomach/Bowel: Normal remainder of the stomach. Scattered colonic diverticula. sigmoid wall thickening and moderate pericolonic edema, including on image 70/2. No pericolonic fluid collection or free extraluminal air. Normal terminal ileum and appendix. Normal small bowel. Vascular/Lymphatic: Aortic and branch vessel atherosclerosis. No abdominopelvic adenopathy. Reproductive: Normal prostate. Other: No significant free fluid. Musculoskeletal: Degenerate disc disease at L3-4. Convex left lumbar spine curvature. IMPRESSION: 1. Non complicated sigmoid diverticulitis. 2. Possible bladder wall thickening and adjacent edema. Question secondary cystitis. No well-defined fistulous communication. 3. Coronary artery atherosclerosis. Aortic Atherosclerosis (ICD10-I70.0). 4. Hiatal hernia. 5. Cholelithiasis. Electronically Signed   By: Abigail Miyamoto M.D.   On: 02/28/2018 14:54    Pending Labs Unresulted Labs (From admission, onward)   None      Vitals/Pain Today's Vitals   02/28/18 1520 02/28/18 1550 02/28/18 1600 02/28/18  1700  BP:  110/74 114/72 133/80  Pulse:  71 71 72  Resp:  '20 18 19  ' Temp:      TempSrc:      SpO2:  96% 93% 95%  Weight:      Height:      PainSc: (S) 3        Isolation Precautions No active isolations  Medications Medications  metroNIDAZOLE (FLAGYL) IVPB 500 mg (has no administration in time range)  metroNIDAZOLE (FLAGYL) IVPB 500 mg (has no administration in time range)  HYDROmorphone (DILAUDID) injection 1 mg (has no administration in time range)  atorvastatin (LIPITOR) tablet 40 mg (has no administration in time range)  carvedilol (COREG) tablet 25 mg (has no administration in time range)  ferrous sulfate tablet 325 mg (has no administration in time range)  furosemide (LASIX) tablet 40 mg (has no administration in time range)  lisinopril (PRINIVIL,ZESTRIL) tablet 40 mg (has no administration in time range)  pantoprazole (PROTONIX) EC tablet 40 mg (has no administration in time range)  rivaroxaban (XARELTO) tablet 20 mg (has no administration in time range)  ciprofloxacin (CIPRO) IVPB 400 mg (has no administration in time range)  ondansetron (ZOFRAN) injection 4 mg (has no administration in time range)  acetaminophen (TYLENOL) tablet 650 mg (has no administration in time range)  hydrALAZINE (APRESOLINE) injection 10 mg (has no administration in time range)  ondansetron (ZOFRAN) injection 4 mg (4 mg Intravenous Given 02/28/18 1422)  sodium chloride 0.9 % bolus 1,000 mL (0 mLs Intravenous Stopped 02/28/18 1539)  morphine 4 MG/ML injection 4 mg (4 mg Intravenous Given 02/28/18 1423)  sodium chloride 0.9 % injection (  Given by Other 02/28/18 1439)  iopamidol (ISOVUE-300) 61 % injection 100 mL ( Intravenous Canceled Entry 02/28/18 1438)  ciprofloxacin (CIPRO) IVPB 400 mg (400 mg Intravenous New Bag/Given 02/28/18 1515)    Mobility walks

## 2018-02-28 NOTE — ED Provider Notes (Addendum)
[facility] DEPT Provider Note   CSN: [id number] Arrival date & time: 02/28/18  1139     History   Chief Complaint Chief Complaint  Patient presents with  . Abdominal Pain    HPI Peter [name] is a 72 y.o. male.  Patient presents with right lower quadrant pain, decreased appetite since Thursday.  He also reports mucousy slimy stool.  No blood in stool.  Past surgical history includes bilateral inguinal hernia repair.  Past medical history includes CAD, hypertension, GI bleed, ureteral stone, chronic kidney disease, many others.  Severity of pain is moderate.  Nothing makes symptoms better or worse.     Past Medical History:  Diagnosis Date  . At risk for sleep apnea    STOP-BANG= 5       SENT TO PCP 07-12-2015  . CAD (coronary artery disease)    a. s/p PCI with DES x 3 to LAD in 2006 with bifurcation lesion and Plavix indefinitely was recommended.  . Chronic combined systolic and diastolic CHF (congestive heart failure) ([place])    a. Prior EF normal but EF dropped to 30-35% in 07/2016 in setting of AFIB.  . CKD (chronic kidney disease), stage III ([place])   . Dyslipidemia   . Dyspnea    tired quick d/t Afib  . Dyspnea on exertion   . GERD (gastroesophageal reflux disease)   . History of GI bleed   . History of kidney stones   . History of MI (myocardial infarction)    11-05-2005--  periprocedural MI  (cardiac cath) per dr [name] note  . Hypertension   . Hypertriglyceridemia   . Iron deficiency anemia   . Myocardial infarction ([place])   . Persistent atrial fibrillation ([place])    a. 07/2016, started on Eliquis -> readmitted later that month for AF RVR/acute HF - initially unsuccessful DCCV following TEE, so loaded with amio with repeat successful DCCV 07/2816.  . Right ureteral stone   . S/P drug eluting coronary stent placement    x3  to LAD  2006  . Sleep apnea    wears BIPAP, 14 up to 24    Patient Active Problem List   Diagnosis Date  Noted  . Acute blood loss anemia 11/13/2017  . AF (atrial fibrillation) ([place]) 10/24/2016  . Obesity 08/09/2016  . Atrial fibrillation with RVR ([place]) 08/03/2016  . New onset a-fib ([place]) 07/16/2016  . Persistent atrial fibrillation ([place]) 07/16/2016  . Acute systolic (congestive) heart failure ([place])   . Ejection fraction   . Iron deficiency anemia due to chronic blood loss 02/04/2012  . Anemia 02/03/2012  . CKD (chronic kidney disease), stage III ([place]) 02/03/2012  . DOE (dyspnea on exertion) 01/28/2012  . Hypertension   . Coronary artery disease due to lipid rich plaque   . Dyslipidemia   . GERD (gastroesophageal reflux disease)     Past Surgical History:  Procedure Laterality Date  . CARDIOVASCULAR STRESS TEST  01-06-2013  dr [name]   normal perfusion study/  no ischemia or infarct/  normal LV function and wall motion , ef 57%  . CARDIOVERSION N/A 08/05/2016   Procedure: CARDIOVERSION;  Surgeon: Sanda Klein, MD;  Location: [facility];  Service: Cardiovascular;  Laterality: N/A;  . CARDIOVERSION N/A 08/08/2016   Procedure: CARDIOVERSION;  Surgeon: Jerline Pain, MD;  Location: [facility] ENDOSCOPY;  Service: Cardiovascular;  Laterality: N/A;  . CORONARY ANGIOPLASTY  11-13-2005 dr [name]   Successful touch up post-dilatation of the 3 tandem overlying  Cypher stents in proximal to mid LAD (based on IVUS 0% stenosis and patent diagonal branch)/  Cutting Balloon Angioplasty of Ramus branch  . CORONARY ANGIOPLASTY WITH STENT PLACEMENT  11-05-2005   dr [name]   PCI and DES x3 to birfurcation lesion LAD and diagonal branch of LAD/   20% dLM,  30-40% RCA, ef 60%  . CYSTOSCOPY WITH RETROGRADE PYELOGRAM, URETEROSCOPY AND STENT PLACEMENT Right 07/14/2015   Procedure: CYSTOSCOPY WITH RETROGRADE PYELOGRAM, URETEROSCOPY AND STENT PLACEMENT;  Surgeon: Alexis Frock, MD;  Location: [facility];  Service: Urology;  Laterality: Right;  . ELECTROPHYSIOLOGIC STUDY N/A 10/24/2016   Procedure:  Atrial Fibrillation Ablation;  Surgeon: Will Meredith Leeds, MD;  Location: [facility] CV LAB;  Service: Cardiovascular;  Laterality: N/A;  . EXTRACORPOREAL SHOCK WAVE LITHOTRIPSY  yrs ago  . HEMORRHOID SURGERY N/A 10/24/2017   Procedure: HEMORRHOIDECTOMY;  Surgeon: Ileana Roup, MD;  Location: [facility];  Service: General;  Laterality: N/A;  . IR GENERIC HISTORICAL  10/23/2016   IR FLUORO GUIDE CV LINE RIGHT 10/23/2016 Greggory Keen, MD [facility]INTER[facility]  . IR GENERIC HISTORICAL  10/23/2016   IR US GUIDE VASC ACCESS RIGHT 10/23/2016 Greggory Keen, MD [facility]  . LEFT URETEROSCOPIC STONE EXTRACTION  04-10-2006  . STONE EXTRACTION WITH BASKET Right 07/14/2015   Procedure: STONE EXTRACTION WITH BASKET;  Surgeon: Alexis Frock, MD;  Location: [facility];  Service: Urology;  Laterality: Right;  . TEE WITHOUT CARDIOVERSION N/A 08/05/2016   Procedure: TRANSESOPHAGEAL ECHOCARDIOGRAM (TEE);  Surgeon: Sanda Klein, MD;  Location: [facility] ENDOSCOPY;  Service: Cardiovascular;  Laterality: N/A;  . TRANSTHORACIC ECHOCARDIOGRAM  02-04-2012   grade I diastolic dysfunction/  ef 55-60%/  trivial MR and TR/  mild LAE        Home Medications    Prior to Admission medications   Medication Sig Start Date End Date Taking? Authorizing Provider  atorvastatin (LIPITOR) 40 MG tablet TAKE 1 TABLET(40 MG) BY MOUTH EVERY MORNING 11/20/17  Yes Camnitz, Will Hassell Done, MD  carvedilol (COREG) 25 MG tablet TAKE 1 TABLET BY MOUTH TWICE DAILY WITH MEALS 12/29/17  Yes Camnitz, Will Hassell Done, MD  diphenhydramine-acetaminophen (TYLENOL PM) 25-500 MG TABS tablet Take 1 tablet by mouth at bedtime.   Yes [provider]  ferrous sulfate 325 (65 FE) MG tablet Take 1 tablet (325 mg total) by mouth every morning. 11/06/17  Yes Camnitz, Will Hassell Done, MD  furosemide (LASIX) 40 MG tablet TAKE 1 TABLET(40 MG) BY MOUTH DAILY 11/06/17  Yes Camnitz, Will Hassell Done, MD  lisinopril (PRINIVIL,ZESTRIL) 40 MG tablet TAKE 1  TABLET(40 MG) BY MOUTH DAILY 11/10/17  Yes Camnitz, Ocie Doyne, MD  nitroGLYCERIN (NITROSTAT) 0.4 MG SL tablet Place 1 tablet (0.4 mg total) under the tongue every 5 (five) minutes as needed for chest pain. X 3 doses 02/24/17  Yes Jerline Pain, MD  pantoprazole (PROTONIX) 40 MG tablet Take 1 tablet (40 mg total) by mouth daily. 11/06/17  Yes Camnitz, Will Hassell Done, MD  polyvinyl alcohol (LIQUIFILM TEARS) 1.4 % ophthalmic solution Place 1 drop into the right eye daily.    Yes [provider]  potassium chloride SA (K-DUR,KLOR-CON) 20 MEQ tablet TAKE 1 TABLET(20 MEQ) BY MOUTH DAILY 11/06/17  Yes Camnitz, Will Hassell Done, MD  XARELTO 20 MG TABS tablet Take 1 tablet (20 mg total) by mouth daily. 11/21/17  Yes Camnitz, Ocie Doyne, MD    Family History Family History  Problem Relation Age of Onset  . Heart attack Father  48    Social History Social History   Tobacco Use  . Smoking status: Never Smoker  . Smokeless tobacco: Never Used  Substance Use Topics  . Alcohol use: No    Comment: 2 beers a year  . Drug use: No     Allergies   Patient has no known allergies.   Review of Systems Review of Systems  All other systems reviewed and are negative.    Physical Exam Updated Vital Signs BP 123/65 (BP Location: Right Arm)   Pulse 78   Temp 98.4 F (36.9 C) (Oral)   Resp 18   Ht 5\' 8"  (1.727 m)   Wt 99.8 kg (220 lb)   SpO2 95%   BMI 33.45 kg/m   Physical Exam  Constitutional: He is oriented to person, place, and time. He appears well-developed and well-nourished.  HENT:  Head: Normocephalic and atraumatic.  Eyes: Conjunctivae are normal.  Neck: Neck supple.  Cardiovascular: Normal rate and regular rhythm.  Pulmonary/Chest: Effort normal and breath sounds normal.  Abdominal: Bowel sounds are normal.  Tender in the right lower quadrant.  Musculoskeletal: Normal range of motion.  Neurological: He is alert and oriented to person, place, and time.  Skin: Skin is warm  and dry.  Psychiatric: He has a normal mood and affect. His behavior is normal.  Nursing note and vitals reviewed.    ED Treatments / Results  Labs (all labs ordered are listed, but only abnormal results are displayed) Labs Reviewed  COMPREHENSIVE METABOLIC PANEL - Abnormal; Notable for the following components:      Result Value   Chloride 112 (*)    CO2 21 (*)    Glucose, Bld 102 (*)    BUN 21 (*)    ALT 15 (*)    GFR calc non Af Amer 58 (*)    All other components within normal limits  CBC - Abnormal; Notable for the following components:   WBC 11.8 (*)    All other components within normal limits  URINALYSIS, ROUTINE W REFLEX MICROSCOPIC - Abnormal; Notable for the following components:   Ketones, ur 5 (*)    All other components within normal limits  LIPASE, BLOOD    EKG None  Radiology Ct Abdomen Pelvis W Contrast  Result Date: 02/28/2018 CLINICAL DATA:  Lower abdominal pain since Thursday. Nausea and vomiting. EXAM: CT ABDOMEN AND PELVIS WITH CONTRAST TECHNIQUE: Multidetector CT imaging of the abdomen and pelvis was performed using the standard protocol following bolus administration of intravenous contrast. CONTRAST:  142mL ISOVUE-300 IOPAMIDOL (ISOVUE-300) INJECTION 61% COMPARISON:  06/12/2015 FINDINGS: Lower chest: Likely calcified nodule along the right major fissure at 2 mm. Mild cardiomegaly, without pericardial effusion. Right coronary artery atherosclerosis. Moderate hiatal hernia. Probable dilatation of the thoracic duct including at 1.6 cm on image 2/2. Hepatobiliary: Normal liver. 2 mm gallstone without acute cholecystitis or biliary duct dilatation. Pancreas: Normal, without mass or ductal dilatation. Spleen: Normal in size, without focal abnormality. Adrenals/Urinary Tract: Normal adrenal glands. Mild renal cortical thinning bilaterally. Bilateral too small to characterize renal lesions. An upper pole left renal lesion measures slightly greater than fluid density  and 1.7 cm on image 22/2. When compared to 06/12/2015, most consistent with a minimally complex cyst. No hydronephrosis. Bladder demonstrates equivocal wall thickening on image 77/2. Mild adjacent edema. Stomach/Bowel: Normal remainder of the stomach. Scattered colonic diverticula. sigmoid wall thickening and moderate pericolonic edema, including on image 70/2. No pericolonic fluid collection or free extraluminal air. Normal terminal  ileum and appendix. Normal small bowel. Vascular/Lymphatic: Aortic and branch vessel atherosclerosis. No abdominopelvic adenopathy. Reproductive: Normal prostate. Other: No significant free fluid. Musculoskeletal: Degenerate disc disease at L3-4. Convex left lumbar spine curvature. IMPRESSION: 1. Non complicated sigmoid diverticulitis. 2. Possible bladder wall thickening and adjacent edema. Question secondary cystitis. No well-defined fistulous communication. 3. Coronary artery atherosclerosis. Aortic Atherosclerosis (ICD10-I70.0). 4. Hiatal hernia. 5. Cholelithiasis. Electronically Signed   By: Abigail Miyamoto M.D.   On: 02/28/2018 14:54    Procedures Procedures (including critical care time)  Medications Ordered in ED Medications  sodium chloride 0.9 % bolus 1,000 mL (1,000 mLs Intravenous New Bag/Given 02/28/18 1422)  ciprofloxacin (CIPRO) IVPB 400 mg (has no administration in time range)  metroNIDAZOLE (FLAGYL) IVPB 500 mg (has no administration in time range)  ondansetron (ZOFRAN) injection 4 mg (4 mg Intravenous Given 02/28/18 1422)  morphine 4 MG/ML injection 4 mg (4 mg Intravenous Given 02/28/18 1423)  sodium chloride 0.9 % injection (  Given by Other 02/28/18 1439)  iopamidol (ISOVUE-300) 61 % injection 100 mL ( Intravenous Canceled Entry 02/28/18 1438)     Initial Impression / Assessment and Plan / ED Course  I have reviewed the triage vital signs and the nursing notes.  Pertinent labs & imaging results that were available during my care of the patient were  reviewed by me and considered in my medical decision making (see chart for details).     Patient presents with right lower quadrant pain and mucousy stool.  Anatomically this is in the appendix area.  However the mucus stool is a red herring.  Could be some form of colitis.  Will obtain CT scan of abdomen and pelvis.  White count minimally elevated.  CT scan reveals sigmoid diverticulitis.  Patient has not eaten in 2 days.  Will initiate IV Cipro, IV Flagyl.  Admit to general medicine.  Final Clinical Impressions(s) / ED Diagnoses   Final diagnoses:  RLQ abdominal pain  Sigmoid diverticulitis    ED Discharge Orders    None       Nat Christen, MD 02/28/18 1423    Nat Christen, MD 02/28/18 1505

## 2018-02-28 NOTE — ED Notes (Signed)
ED Provider at bedside. 

## 2018-02-28 NOTE — ED Notes (Signed)
Bed: WN46 Expected date:  Expected time:  Means of arrival:  Comments: Hold for Triage

## 2018-02-28 NOTE — ED Triage Notes (Signed)
Lower abd pain since Thursday with nausea and vomiting at intervals, unable to eat, last bm 2 days ago.Decreased bowel sounds in lower abd, last bm mucus

## 2018-02-28 NOTE — H&P (Signed)
History and Physical  Peter [name] VVO:[id number] DOB: Jan 31, 1946 DOA: 02/28/2018  Referring physician: Dr Lacinda Axon PCP: [name], MD  Outpatient Specialists: Howie Ill, cardiology Patient coming from: Home  Chief Complaint: Severe right lower quadrant abdominal pain  HPI: Peter [name] is a 72 y.o. male with medical history significant for paroxysmal A. fib on Xarelto, CKD 3, hyperlipidemia, chronic diastolic heart failure, obesity, colon polyps, who presented to [facility] ED with complaints of severe right and left lower quadrant pain.  Onset 2 days ago associated with mucousy stools, nausea and vomiting.  Has not eaten in 2 days due to severe abdominal pain.  Denies any fevers or chills.  Denies any cardiopulmonary symptoms.  Reports had 3 colonoscopies in the past. Last colonoscopy was less than 5 years ago with colon polyps removed. No family history of GI disease or GI cancer. Follows with [facility].  ED Course: Vital signs unremarkable, laboratory findings are unremarkable.  CT abdomen pelvis with contrast revealed non complicated sigmoid diverticulitis.  Patient admitted for acute sigmoid diverticulitis requiring IV antibiotics due to lack of oral intake and IV pain medications.  Review of Systems: Review of systems as noted in HPI.  All other systems reviewed and are negative.   Past Medical History:  Diagnosis Date  . At risk for sleep apnea    STOP-BANG= 5       SENT TO PCP 07-12-2015  . CAD (coronary artery disease)    a. s/p PCI with DES x 3 to LAD in 2006 with bifurcation lesion and Plavix indefinitely was recommended.  . Chronic combined systolic and diastolic CHF (congestive heart failure) ([place])    a. Prior EF normal but EF dropped to 30-35% in 07/2016 in setting of AFIB.  . CKD (chronic kidney disease), stage III ([place])   . Dyslipidemia   . Dyspnea    tired quick d/t Afib  . Dyspnea on exertion   . GERD (gastroesophageal reflux disease)   . History of GI bleed   .  History of kidney stones   . History of MI (myocardial infarction)    11-05-2005--  periprocedural MI  (cardiac cath) per dr [name] note  . Hypertension   . Hypertriglyceridemia   . Iron deficiency anemia   . Myocardial infarction ([place])   . Persistent atrial fibrillation ([place])    a. 07/2016, started on Eliquis -> readmitted later that month for AF RVR/acute HF - initially unsuccessful DCCV following TEE, so loaded with amio with repeat successful DCCV 07/2816.  . Right ureteral stone   . S/P drug eluting coronary stent placement    x3  to LAD  2006  . Sleep apnea    wears BIPAP, 14 up to 24   Past Surgical History:  Procedure Laterality Date  . CARDIOVASCULAR STRESS TEST  01-06-2013  dr [name]   normal perfusion study/  no ischemia or infarct/  normal LV function and wall motion , ef 57%  . CARDIOVERSION N/A 08/05/2016   Procedure: CARDIOVERSION;  Surgeon: Sanda Klein, MD;  Location: [place];  Service: Cardiovascular;  Laterality: N/A;  . CARDIOVERSION N/A 08/08/2016   Procedure: CARDIOVERSION;  Surgeon: Jerline Pain, MD;  Location: [facility] ENDOSCOPY;  Service: Cardiovascular;  Laterality: N/A;  . CORONARY ANGIOPLASTY  11-13-2005 dr [name]   Successful touch up post-dilatation of the 3 tandem overlying Cypher stents in proximal to mid LAD (based on IVUS 0% stenosis and patent diagonal branch)/  Cutting Balloon Angioplasty of Ramus branch  .  CORONARY ANGIOPLASTY WITH STENT PLACEMENT  11-05-2005   dr [name]   PCI and DES x3 to birfurcation lesion LAD and diagonal branch of LAD/   20% dLM,  30-40% RCA, ef 60%  . CYSTOSCOPY WITH RETROGRADE PYELOGRAM, URETEROSCOPY AND STENT PLACEMENT Right 07/14/2015   Procedure: CYSTOSCOPY WITH RETROGRADE PYELOGRAM, URETEROSCOPY AND STENT PLACEMENT;  Surgeon: Alexis Frock, MD;  Location: [facility];  Service: Urology;  Laterality: Right;  . ELECTROPHYSIOLOGIC STUDY N/A 10/24/2016   Procedure: Atrial Fibrillation Ablation;  Surgeon: Will  Meredith Leeds, MD;  Location: [place] CV LAB;  Service: Cardiovascular;  Laterality: N/A;  . EXTRACORPOREAL SHOCK WAVE LITHOTRIPSY  yrs ago  . HEMORRHOID SURGERY N/A 10/24/2017   Procedure: HEMORRHOIDECTOMY;  Surgeon: Ileana Roup, MD;  Location: [place];  Service: General;  Laterality: N/A;  . IR GENERIC HISTORICAL  10/23/2016   IR FLUORO GUIDE CV LINE RIGHT 10/23/2016 Greggory Keen, MD [facility]  . IR GENERIC HISTORICAL  10/23/2016   IR US GUIDE VASC ACCESS RIGHT 10/23/2016 Greggory Keen, MD [facility]  . LEFT URETEROSCOPIC STONE EXTRACTION  04-10-2006  . STONE EXTRACTION WITH BASKET Right 07/14/2015   Procedure: STONE EXTRACTION WITH BASKET;  Surgeon: Alexis Frock, MD;  Location: [facility];  Service: Urology;  Laterality: Right;  . TEE WITHOUT CARDIOVERSION N/A 08/05/2016   Procedure: TRANSESOPHAGEAL ECHOCARDIOGRAM (TEE);  Surgeon: Sanda Klein, MD;  Location: [facility] ENDOSCOPY;  Service: Cardiovascular;  Laterality: N/A;  . TRANSTHORACIC ECHOCARDIOGRAM  02-04-2012   grade I diastolic dysfunction/  ef 55-60%/  trivial MR and TR/  mild LAE    Social History:  reports that he has never smoked. He has never used smokeless tobacco. He reports that he does not drink alcohol or use drugs.   No Known Allergies  Family History  Problem Relation Age of Onset  . Heart attack Father 29    Self reported no family history of GI disease or colon cancer.  Prior to Admission medications   Medication Sig Start Date End Date Taking? Authorizing Provider  atorvastatin (LIPITOR) 40 MG tablet TAKE 1 TABLET(40 MG) BY MOUTH EVERY MORNING 11/20/17  Yes Camnitz, Will Hassell Done, MD  carvedilol (COREG) 25 MG tablet TAKE 1 TABLET BY MOUTH TWICE DAILY WITH MEALS 12/29/17  Yes Camnitz, Will Hassell Done, MD  diphenhydramine-acetaminophen (TYLENOL PM) 25-500 MG TABS tablet Take 1 tablet by mouth at bedtime.   Yes [provider]  ferrous sulfate 325 (65 FE) MG tablet Take 1 tablet  (325 mg total) by mouth every morning. 11/06/17  Yes Camnitz, Will Hassell Done, MD  furosemide (LASIX) 40 MG tablet TAKE 1 TABLET(40 MG) BY MOUTH DAILY 11/06/17  Yes Camnitz, Will Hassell Done, MD  lisinopril (PRINIVIL,ZESTRIL) 40 MG tablet TAKE 1 TABLET(40 MG) BY MOUTH DAILY 11/10/17  Yes Camnitz, Ocie Doyne, MD  nitroGLYCERIN (NITROSTAT) 0.4 MG SL tablet Place 1 tablet (0.4 mg total) under the tongue every 5 (five) minutes as needed for chest pain. X 3 doses 02/24/17  Yes Jerline Pain, MD  pantoprazole (PROTONIX) 40 MG tablet Take 1 tablet (40 mg total) by mouth daily. 11/06/17  Yes Camnitz, Will Hassell Done, MD  polyvinyl alcohol (LIQUIFILM TEARS) 1.4 % ophthalmic solution Place 1 drop into the right eye daily.    Yes [provider]  potassium chloride SA (K-DUR,KLOR-CON) 20 MEQ tablet TAKE 1 TABLET(20 MEQ) BY MOUTH DAILY 11/06/17  Yes Camnitz, Will Hassell Done, MD  XARELTO 20 MG TABS tablet Take 1 tablet (20 mg total) by  mouth daily. 11/21/17  Yes Constance Haw, MD    Physical Exam: BP 129/73   Pulse 68   Temp 98.4 F (36.9 C) (Oral)   Resp 18   Ht 5\' 8"  (1.727 m)   Wt 99.8 kg (220 lb)   SpO2 97%   BMI 33.45 kg/m   General: 72 year old Caucasian male well-developed well-nourished in no acute distress.  Alert and oriented x3. Eyes: Sclarae anicteric. ENT: Mucous membranes dry with no erythema or exudates. Neck: No JVD or thyromegaly. Cardiovascular: Regular rate and rhythm with no rubs or gallops. Respiratory: Clear to auscultation with no wheezes or rales. Abdomen: Right and left lower quadrant abdominal tenderness on palpation.  Normal bowel sounds x4 quadrants. Skin: No ulcerative lesions or rashes. Musculoskeletal: Moves all 4 extremities.  No lower extremity edema. Psychiatric: Mood is appropriate for condition and setting. Neurologic: Alert and oriented x3.  No focal motor deficits.          Labs on Admission:  Basic Metabolic Panel: Recent Labs  Lab 02/28/18 1320  NA  143  K 4.6  CL 112*  CO2 21*  GLUCOSE 102*  BUN 21*  CREATININE 1.21  CALCIUM 10.0   Liver Function Tests: Recent Labs  Lab 02/28/18 1320  AST 15  ALT 15*  ALKPHOS 81  BILITOT 1.2  PROT 7.7  ALBUMIN 3.9   Recent Labs  Lab 02/28/18 1320  LIPASE 28   No results for input(s): AMMONIA in the last 168 hours. CBC: Recent Labs  Lab 02/28/18 1320  WBC 11.8*  HGB 14.5  HCT 43.6  MCV 96.5  PLT 167   Cardiac Enzymes: No results for input(s): CKTOTAL, CKMB, CKMBINDEX, TROPONINI in the last 168 hours.  BNP (last 3 results) No results for input(s): BNP in the last 8760 hours.  ProBNP (last 3 results) No results for input(s): PROBNP in the last 8760 hours.  CBG: No results for input(s): GLUCAP in the last 168 hours.  Radiological Exams on Admission: Ct Abdomen Pelvis W Contrast  Result Date: 02/28/2018 CLINICAL DATA:  Lower abdominal pain since Thursday. Nausea and vomiting. EXAM: CT ABDOMEN AND PELVIS WITH CONTRAST TECHNIQUE: Multidetector CT imaging of the abdomen and pelvis was performed using the standard protocol following bolus administration of intravenous contrast. CONTRAST:  126mL ISOVUE-300 IOPAMIDOL (ISOVUE-300) INJECTION 61% COMPARISON:  06/12/2015 FINDINGS: Lower chest: Likely calcified nodule along the right major fissure at 2 mm. Mild cardiomegaly, without pericardial effusion. Right coronary artery atherosclerosis. Moderate hiatal hernia. Probable dilatation of the thoracic duct including at 1.6 cm on image 2/2. Hepatobiliary: Normal liver. 2 mm gallstone without acute cholecystitis or biliary duct dilatation. Pancreas: Normal, without mass or ductal dilatation. Spleen: Normal in size, without focal abnormality. Adrenals/Urinary Tract: Normal adrenal glands. Mild renal cortical thinning bilaterally. Bilateral too small to characterize renal lesions. An upper pole left renal lesion measures slightly greater than fluid density and 1.7 cm on image 22/2. When compared  to 06/12/2015, most consistent with a minimally complex cyst. No hydronephrosis. Bladder demonstrates equivocal wall thickening on image 77/2. Mild adjacent edema. Stomach/Bowel: Normal remainder of the stomach. Scattered colonic diverticula. sigmoid wall thickening and moderate pericolonic edema, including on image 70/2. No pericolonic fluid collection or free extraluminal air. Normal terminal ileum and appendix. Normal small bowel. Vascular/Lymphatic: Aortic and branch vessel atherosclerosis. No abdominopelvic adenopathy. Reproductive: Normal prostate. Other: No significant free fluid. Musculoskeletal: Degenerate disc disease at L3-4. Convex left lumbar spine curvature. IMPRESSION: 1. Non complicated sigmoid diverticulitis. 2. Possible  bladder wall thickening and adjacent edema. Question secondary cystitis. No well-defined fistulous communication. 3. Coronary artery atherosclerosis. Aortic Atherosclerosis (ICD10-I70.0). 4. Hiatal hernia. 5. Cholelithiasis. Electronically Signed   By: Abigail Miyamoto M.D.   On: 02/28/2018 14:54    EKG: Independently reviewed.  None available in the ED at the time of this dictation.  Assessment/Plan Present on Admission: . Diverticulitis  Active Problems:   Diverticulitis  Acute sigmoid diverticulitis Last colonoscopy was less than 5 years ago 15 with [facility] Admitted for IV antibiotics and IV pain medications.  Lack of oral intake due to severe abdominal pain. Pain management in place with IV Dilaudid 1 mg every 4 hours as needed for severe pain With improvement of abdominal pain start clear liquid diet as tolerated IV Zofran for nausea as needed Continue IV ciprofloxacin and IV Flagyl Discharge on oral antibiotics.  Will need to complete a total of 10-14 days of antibiotics. Caution with IV fluid due to history of chronic diastolic heart failure  Chronic diastolic heart failure Not in exacerbation Continue current medications Strict I's and O's Daily  weight Last 2D echo done 11/22/2016 revealed LV EF 55-60%  Paroxysmal A. fib Rate controlled On Xarelto for CVA prophylaxis Continue close monitoring  Obesity Recommend weight loss outpatient  GERD Continue Protonix    DVT prophylaxis: Xarelto  Code Status: Full code  Family Communication: None at bedside  Disposition Plan: Admit to [place] called: None  Admission status: Inpatient    Kayleen Memos MD [facility] Pager [phone]  If 7PM-7AM, please contact night-coverage [email or web address] Password [facility]  02/28/2018, 3:28 PM

## 2018-02-28 NOTE — Plan of Care (Signed)
Plan of care discussed with pt and wife; both verbalized understanding.  VSS on RA.  Pain controlled at tolerable level.  Low bed; no falls.  Call bell and bedside table within reach.  Pt with no questions or concerns at this time.

## 2018-02-28 NOTE — ED Notes (Signed)
Attempted to call report. RN stepped off floor. Secretary informed RN that [employer] will call main ED number to get in touch with me.

## 2018-03-01 DIAGNOSIS — N182 Chronic kidney disease, stage 2 (mild): Secondary | ICD-10-CM

## 2018-03-01 DIAGNOSIS — G4733 Obstructive sleep apnea (adult) (pediatric): Secondary | ICD-10-CM

## 2018-03-01 DIAGNOSIS — I5032 Chronic diastolic (congestive) heart failure: Secondary | ICD-10-CM

## 2018-03-01 DIAGNOSIS — I4891 Unspecified atrial fibrillation: Secondary | ICD-10-CM

## 2018-03-01 MED ORDER — ATORVASTATIN CALCIUM 40 MG PO TABS
40.0000 mg | ORAL_TABLET | Freq: Every day | ORAL | Status: DC
  Administered 2018-03-02 – 2018-03-03 (×2): 40 mg via ORAL
  Filled 2018-03-01 (×2): qty 1

## 2018-03-01 MED ORDER — HYDROMORPHONE HCL 1 MG/ML IJ SOLN: 1.0000 mg | Freq: Once | INTRAMUSCULAR | Status: DC

## 2018-03-01 MED ORDER — PROMETHAZINE HCL 25 MG/ML IJ SOLN
12.5000 mg | Freq: Four times a day (QID) | INTRAMUSCULAR | Status: DC | PRN
  Administered 2018-03-01 – 2018-03-02 (×2): 12.5 mg via INTRAVENOUS
  Filled 2018-03-01 (×2): qty 1

## 2018-03-01 NOTE — Progress Notes (Signed)
Pt. Refused CPAP/BIPAP with reasons on tooth extraction recently and air dries socket and causes pain and swelling.  Will make available for patient if he changes in mind throughout shift.  RN aware.

## 2018-03-01 NOTE — Progress Notes (Signed)
PROGRESS NOTE  Peter [name] CVE:[id number] DOB: 04-20-1946 DOA: 02/28/2018 PCP: [name], MD  HPI/Recap of past 24 hours: Feeling better, less pain, no fever,no n/v, no diarrhea  Assessment/Plan: Active Problems:   Diverticulitis  Acute sigmoid diverticulitis with poor oral intake, ab pain -Last colonoscopy was less than 5 years ago by [facility] -improving on cipro/flagyl, continue  -prn analgesics and antiemetics -hold lasix, Caution with IV fluid due to history of chronic diastolic heart failure -currently on clears, advance as tolerated   Possible bladder wall thickening and adjacent edema. Question secondary cystitis. No well-defined fistulous communication. However, ua unremarkable monitor   Atrial fibrillation, s/p ablation in 2017 - rate controlled - continue Carvedilol and  Xarelto .   CAD s/p pci to lad in 2006 No chest pain, continue statin  Chronic combined CHF - last ECHO in 11/2016 with stable EF 60% - in 2017 EF was as low as 30% - currently no signs of volume overload -  lasix  Hold for now due to poor oral intake, continue lisinopril  HTN - continue home regimen  Lisinopril, carvedilol , hold lasix  HLD - continue Atorvastatin  CKD stage II -  Cr is currently at baseline  -renal dosing meds   OSA Report has not use bipap for the last week due to recent dental work Body mass index is 33.82 kg/m.   Code Status: full  Family Communication: patient   Disposition Plan: not ready for discharge   Consultants:  none  Procedures:  none  Antibiotics:  cipro/flagyl   Objective: BP (!) 116/94 (BP Location: Right Arm)   Pulse 64   Temp 98.8 F (37.1 C) (Oral)   Resp 16   Ht 5\' 8"  (1.727 m)   Wt 100.9 kg (222 lb 7.1 oz)   SpO2 98%   BMI 33.82 kg/m   Intake/Output Summary (Last 24 hours) at 03/01/2018 1720 Last data filed at 03/01/2018 0900 Gross per 24 hour  Intake 620 ml  Output -  Net 620 ml   Filed  Weights   02/28/18 1307 03/01/18 1651  Weight: 99.8 kg (220 lb) 100.9 kg (222 lb 7.1 oz)    Exam: Patient is examined daily including today on 03/01/2018, exams remain the same as of yesterday except that has changed    General:  NAD  Cardiovascular: RRR  Respiratory: CTABL  Abdomen: mild tender suprapubic and left lower quadrant, no guarding, no rebound, positive BS  Musculoskeletal: No Edema  Neuro: alert, oriented   Data Reviewed: Basic Metabolic Panel: Recent Labs  Lab 02/28/18 1320  NA 143  K 4.6  CL 112*  CO2 21*  GLUCOSE 102*  BUN 21*  CREATININE 1.21  CALCIUM 10.0   Liver Function Tests: Recent Labs  Lab 02/28/18 1320  AST 15  ALT 15*  ALKPHOS 81  BILITOT 1.2  PROT 7.7  ALBUMIN 3.9   Recent Labs  Lab 02/28/18 1320  LIPASE 28   No results for input(s): AMMONIA in the last 168 hours. CBC: Recent Labs  Lab 02/28/18 1320  WBC 11.8*  HGB 14.5  HCT 43.6  MCV 96.5  PLT 167   Cardiac Enzymes:   No results for input(s): CKTOTAL, CKMB, CKMBINDEX, TROPONINI in the last 168 hours. BNP (last 3 results) No results for input(s): BNP in the last 8760 hours.  ProBNP (last 3 results) No results for input(s): PROBNP in the last 8760 hours.  CBG: No results for input(s): GLUCAP in the last  168 hours.  No results found for this or any previous visit (from the past 240 hour(s)).   Studies: No results found.  Scheduled Meds: . atorvastatin  40 mg Oral q1800  . carvedilol  25 mg Oral BID WC  . ferrous sulfate  325 mg Oral q morning - 10a  . furosemide  40 mg Oral Daily  . lisinopril  40 mg Oral Daily  . pantoprazole  40 mg Oral Daily  . rivaroxaban  20 mg Oral Q supper    Continuous Infusions: . ciprofloxacin Stopped (03/01/18 0618)  . metronidazole 500 mg (03/01/18 1652)     Time spent: 35 mins I have personally reviewed and interpreted on  03/01/2018 daily labs, tele strips, imagings as discussed above under date review session and  assessment and plans.  I reviewed all nursing notes, pharmacy notes, vitals, pertinent old records  I have discussed plan of care as described above with RN , patient  on 03/01/2018   Florencia Reasons MD, PhD  [facility] Pager [phone]. If 7PM-7AM, please contact night-coverage at [email or web address], password [facility] 03/01/2018, 5:20 PM  LOS: 1 day

## 2018-03-02 DIAGNOSIS — N183 Chronic kidney disease, stage 3 (moderate): Secondary | ICD-10-CM

## 2018-03-02 DIAGNOSIS — I480 Paroxysmal atrial fibrillation: Secondary | ICD-10-CM

## 2018-03-02 LAB — CBC WITH DIFFERENTIAL/PLATELET
BASOS ABS: 0 10*3/uL (ref 0.0–0.1)
Basophils Relative: 0 %
Eosinophils Absolute: 0.1 10*3/uL (ref 0.0–0.7)
Eosinophils Relative: 1 %
HEMATOCRIT: 38.2 % — AB (ref 39.0–52.0)
HEMOGLOBIN: 12.2 g/dL — AB (ref 13.0–17.0)
LYMPHS ABS: 1.1 10*3/uL (ref 0.7–4.0)
LYMPHS PCT: 13 %
MCH: 31.5 pg (ref 26.0–34.0)
MCHC: 31.9 g/dL (ref 30.0–36.0)
MCV: 98.7 fL (ref 78.0–100.0)
Monocytes Absolute: 0.6 10*3/uL (ref 0.1–1.0)
Monocytes Relative: 7 %
NEUTROS ABS: 6.3 10*3/uL (ref 1.7–7.7)
Neutrophils Relative %: 79 %
PLATELETS: 150 10*3/uL (ref 150–400)
RBC: 3.87 MIL/uL — AB (ref 4.22–5.81)
RDW: 13.5 % (ref 11.5–15.5)
WBC: 8 10*3/uL (ref 4.0–10.5)

## 2018-03-02 LAB — BASIC METABOLIC PANEL
ANION GAP: 12 (ref 5–15)
BUN: 20 mg/dL (ref 6–20)
CO2: 22 mmol/L (ref 22–32)
Calcium: 9 mg/dL (ref 8.9–10.3)
Chloride: 110 mmol/L (ref 101–111)
Creatinine, Ser: 1.38 mg/dL — ABNORMAL HIGH (ref 0.61–1.24)
GFR calc Af Amer: 57 mL/min — ABNORMAL LOW (ref >60)
GFR, EST NON AFRICAN AMERICAN: 50 mL/min — AB (ref >60)
GLUCOSE: 94 mg/dL (ref 65–99)
Potassium: 4.2 mmol/L (ref 3.5–5.1)
Sodium: 144 mmol/L (ref 135–145)

## 2018-03-02 LAB — LACTIC ACID, PLASMA: Lactic Acid, Venous: 0.9 mmol/L (ref 0.5–1.9)

## 2018-03-02 LAB — MAGNESIUM: Magnesium: 1.7 mg/dL (ref 1.7–2.4)

## 2018-03-02 MED ORDER — MAGNESIUM SULFATE 2 GM/50ML IV SOLN
2.0000 g | Freq: Once | INTRAVENOUS | Status: AC
  Administered 2018-03-02: 2 g via INTRAVENOUS
  Filled 2018-03-02: qty 50

## 2018-03-02 NOTE — Progress Notes (Signed)
CPAP offered and refused by patient.  Requested that if he changes his mind to call and I would be glad to bring him a machine.

## 2018-03-02 NOTE — Progress Notes (Addendum)
PROGRESS NOTE  Peter [name] [id number]:[id number] DOB: 09/25/1946 DOA: 02/28/2018 PCP: [name], MD  HPI/Recap of past 24 hours:   less pain, no fever,no n/v, no diarrhea He tolerated clears  Assessment/Plan: Active Problems:   Diverticulitis  Acute sigmoid diverticulitis with poor oral intake, ab pain -Last colonoscopy was less than 5 years ago by [facility] -improving on cipro/flagyl, continue  -prn analgesics and antiemetics -hold lasix, Caution with IV fluid due to history of chronic diastolic heart failure -wbc normalized. Lactic acid 0.9. -advance diet to full liquid   Possible bladder wall thickening and adjacent edema. Question secondary cystitis. No well-defined fistulous communication. However, ua unremarkable monitor  Hypomagnesemia:  replace mag to keep>2 in the setting of h/o afib.  Atrial fibrillation, s/p ablation in 2017 - rate controlled - continue Carvedilol and  Xarelto .   CAD s/p pci to lad in 2006 No chest pain, continue statin  Chronic combined CHF - last ECHO in 11/2016 with stable EF 60% - in 2017 EF was as low as 30% - currently no signs of volume overload -  lasix  Hold for now due to poor oral intake, continue lisinopril  HTN - continue home regimen  Lisinopril, carvedilol , hold lasix  HLD - continue Atorvastatin  CKD stage II -  Cr is currently at baseline  -renal dosing meds   OSA Report has not use bipap for the last week due to recent dental work Body mass index is 33.83 kg/m.   Code Status: full  Family Communication: patient   Disposition Plan: home hopefully tomorrow if able to tolerate diet advancement and ab pain continue to improve.   Consultants:  none  Procedures:  none  Antibiotics:  cipro/flagyl   Objective: BP (!) 129/115 (BP Location: Right Arm)   Pulse 83   Temp 99.5 F (37.5 C) (Oral)   Resp 16   Ht 5\' 8"  (1.727 m)   Wt 100.9 kg (222 lb 8 oz)   SpO2 (!) 86%   BMI  33.83 kg/m   Intake/Output Summary (Last 24 hours) at 03/02/2018 1458 Last data filed at 03/02/2018 1000 Gross per 24 hour  Intake 1860 ml  Output 1 ml  Net 1859 ml   Filed Weights   02/28/18 1307 03/01/18 1651 03/02/18 0519  Weight: 99.8 kg (220 lb) 100.9 kg (222 lb 7.1 oz) 100.9 kg (222 lb 8 oz)    Exam: Patient is examined daily including today on 03/02/2018, exams remain the same as of yesterday except that has changed    General:  NAD  Cardiovascular: RRR  Respiratory: CTABL  Abdomen: mild tender suprapubic and left lower quadrant, no guarding, no rebound, positive BS  Musculoskeletal: No Edema  Neuro: alert, oriented   Data Reviewed: Basic Metabolic Panel: Recent Labs  Lab 02/28/18 1320 03/02/18 0429  NA 143 144  K 4.6 4.2  CL 112* 110  CO2 21* 22  GLUCOSE 102* 94  BUN 21* 20  CREATININE 1.21 1.38*  CALCIUM 10.0 9.0  MG  --  1.7   Liver Function Tests: Recent Labs  Lab 02/28/18 1320  AST 15  ALT 15*  ALKPHOS 81  BILITOT 1.2  PROT 7.7  ALBUMIN 3.9   Recent Labs  Lab 02/28/18 1320  LIPASE 28   No results for input(s): AMMONIA in the last 168 hours. CBC: Recent Labs  Lab 02/28/18 1320 03/02/18 0429  WBC 11.8* 8.0  NEUTROABS  --  6.3  HGB 14.5 12.2*  HCT 43.6 38.2*  MCV 96.5 98.7  PLT 167 150   Cardiac Enzymes:   No results for input(s): CKTOTAL, CKMB, CKMBINDEX, TROPONINI in the last 168 hours. BNP (last 3 results) No results for input(s): BNP in the last 8760 hours.  ProBNP (last 3 results) No results for input(s): PROBNP in the last 8760 hours.  CBG: No results for input(s): GLUCAP in the last 168 hours.  No results found for this or any previous visit (from the past 240 hour(s)).   Studies: No results found.  Scheduled Meds: . atorvastatin  40 mg Oral Daily  . carvedilol  25 mg Oral BID WC  . ferrous sulfate  325 mg Oral q morning - 10a  .  HYDROmorphone (DILAUDID) injection  1 mg Intravenous Once  . lisinopril  40 mg  Oral Daily  . pantoprazole  40 mg Oral Daily  . rivaroxaban  20 mg Oral Q supper    Continuous Infusions: . ciprofloxacin Stopped (03/02/18 0600)  . metronidazole Stopped (03/02/18 0914)     Time spent: 25 mins I have personally reviewed and interpreted on  03/02/2018 daily labs, tele strips, imagings as discussed above under date review session and assessment and plans.  I reviewed all nursing notes, pharmacy notes, vitals, pertinent old records  I have discussed plan of care as described above with RN , patient  on 03/02/2018   Florencia Reasons MD, PhD  [facility] Pager [phone]. If 7PM-7AM, please contact night-coverage at [email or web address], password [facility] 03/02/2018, 2:58 PM  LOS: 2 days

## 2018-03-03 LAB — CBC WITH DIFFERENTIAL/PLATELET
BASOS PCT: 0 %
Basophils Absolute: 0 10*3/uL (ref 0.0–0.1)
EOS ABS: 0.2 10*3/uL (ref 0.0–0.7)
EOS PCT: 3 %
HEMATOCRIT: 38.1 % — AB (ref 39.0–52.0)
Hemoglobin: 12.1 g/dL — ABNORMAL LOW (ref 13.0–17.0)
LYMPHS PCT: 22 %
Lymphs Abs: 1.4 10*3/uL (ref 0.7–4.0)
MCH: 30.6 pg (ref 26.0–34.0)
MCHC: 31.8 g/dL (ref 30.0–36.0)
MCV: 96.5 fL (ref 78.0–100.0)
MONO ABS: 0.6 10*3/uL (ref 0.1–1.0)
Monocytes Relative: 10 %
Neutro Abs: 4.2 10*3/uL (ref 1.7–7.7)
Neutrophils Relative %: 65 %
Platelets: 139 10*3/uL — ABNORMAL LOW (ref 150–400)
RBC: 3.95 MIL/uL — AB (ref 4.22–5.81)
RDW: 13 % (ref 11.5–15.5)
WBC: 6.4 10*3/uL (ref 4.0–10.5)

## 2018-03-03 LAB — BASIC METABOLIC PANEL
Anion gap: 10 (ref 5–15)
BUN: 14 mg/dL (ref 6–20)
CO2: 23 mmol/L (ref 22–32)
Calcium: 9.1 mg/dL (ref 8.9–10.3)
Chloride: 111 mmol/L (ref 101–111)
Creatinine, Ser: 1.19 mg/dL (ref 0.61–1.24)
GFR calc non Af Amer: 59 mL/min — ABNORMAL LOW (ref >60)
Glucose, Bld: 87 mg/dL (ref 65–99)
POTASSIUM: 3.7 mmol/L (ref 3.5–5.1)
SODIUM: 144 mmol/L (ref 135–145)

## 2018-03-03 LAB — MAGNESIUM: MAGNESIUM: 1.7 mg/dL (ref 1.7–2.4)

## 2018-03-03 MED ORDER — OXYCODONE-ACETAMINOPHEN 5-325 MG PO TABS: 1.0000 | ORAL_TABLET | Freq: Three times a day (TID) | ORAL | 0 refills | Status: DC | PRN

## 2018-03-03 MED ORDER — METRONIDAZOLE 500 MG PO TABS: 500.0000 mg | ORAL_TABLET | Freq: Three times a day (TID) | ORAL | 0 refills | Status: AC

## 2018-03-03 MED ORDER — POTASSIUM CHLORIDE CRYS ER 20 MEQ PO TBCR
30.0000 meq | EXTENDED_RELEASE_TABLET | Freq: Once | ORAL | Status: AC
  Administered 2018-03-03: 30 meq via ORAL
  Filled 2018-03-03 (×2): qty 1

## 2018-03-03 MED ORDER — CIPROFLOXACIN HCL 500 MG PO TABS: 500.0000 mg | ORAL_TABLET | Freq: Two times a day (BID) | ORAL | 0 refills | Status: AC

## 2018-03-03 MED ORDER — MAGNESIUM SULFATE 2 GM/50ML IV SOLN
2.0000 g | Freq: Once | INTRAVENOUS | Status: AC
  Administered 2018-03-03: 2 g via INTRAVENOUS
  Filled 2018-03-03: qty 50

## 2018-03-03 NOTE — Discharge Summary (Signed)
Discharge Summary  Peter [name] VFI:[id number] DOB: [birth year]  PCP: [name], MD  Admit date: 02/28/2018 Discharge date: 03/03/2018  Time spent: <85mins  Recommendations for Outpatient Follow-up:  1. F/u with PMD within a week  for hospital discharge follow up, repeat cbc/bmp at follow up.  2. F/u with [facility]  Discharge Diagnoses:  Active Hospital Problems   Diagnosis Date Noted  . Diverticulitis 02/28/2018    Resolved Hospital Problems  No resolved problems to display.    Discharge Condition: stable  Diet recommendation: heart healthy  Filed Weights   03/01/18 1651 03/02/18 0519 03/03/18 0500  Weight: 100.9 kg (222 lb 7.1 oz) 100.9 kg (222 lb 8 oz) 99.8 kg (220 lb 0.3 oz)    History of present illness: (per admitting MD Dr Nevada Crane) PCP: [name], MD  Outpatient Specialists: Howie Ill, cardiology Patient coming from: Home  Chief Complaint: Severe right lower quadrant abdominal pain  HPI: Peter [name] is a 72 y.o. male with medical history significant for paroxysmal A. fib on Xarelto, CKD 3, hyperlipidemia, chronic diastolic heart failure, obesity, colon polyps, who presented to [facility] ED with complaints of severe right and left lower quadrant pain.  Onset 2 days ago associated with mucousy stools, nausea and vomiting.  Has not eaten in 2 days due to severe abdominal pain.  Denies any fevers or chills.  Denies any cardiopulmonary symptoms.  Reports had 3 colonoscopies in the past. Last colonoscopy was less than 5 years ago with colon polyps removed. No family history of GI disease or GI cancer. Follows with [facility].  ED Course: Vital signs unremarkable, laboratory findings are unremarkable.  CT abdomen pelvis with contrast revealed non complicated sigmoid diverticulitis.  Patient admitted for acute sigmoid diverticulitis requiring IV antibiotics due to lack of oral intake and IV pain medications.     Hospital Course:  Active Problems:  Diverticulitis   Acute sigmoid diverticulitis with poor oral intake, ab pain -Last colonoscopy was less than 5 years ago by [facility] -improving on cipro/flagyl/ivf  -wbc normalized. Lactic acid 0.9. -ab pain has much improved, no n/v, no fever, he tolerated diet advancement, he wants to go home -he is discharged on cipro/flagyl for another 7 days to finish total of 10days treatment -he is to follow up with pmd and [facility].   Possible bladder wall thickening and adjacent edema. Question secondary cystitis. Not well-defined fistulous communication. However, ua unremarkable Monitor, follow up with pmd.  Hypomagnesemia:  replace mag to keep>2 in the setting of h/o afib.  Atrial fibrillation, s/p ablation in 2017 - rate controlled - continue Carvedilol and  Xarelto.  CAD s/p pci to lad in 2006 No chest pain, continue statin  Chronic combined CHF - last ECHO in 11/2016 with stable EF 60% - in 2017 EF was as low as 30% - currently no signs of volume overload -continue lisinopril, coreg -  lasix  Hold in the hospital, resumed at discharge.  HTN - continue home regimen  Lisinopril, carvedilol ,  - lasix held in the hospital, resumed at discharge.  HLD - continue Atorvastatin  CKD stage II -bun 21/cr 1.2 on presentation, -bun 14/cr 1.19 at discharge -  Cr is currently at baseline  -renal dosing meds   OSA Report has not use bipap for the last week due to recent dental work Body mass index is 33.83 kg/m.   Code Status: full  Family Communication: patient   Disposition Plan: home    Consultants:  none  Procedures:  none  Antibiotics:  cipro/flagyl   Discharge Exam: BP 127/87 (BP Location: Left Arm)   Pulse 65   Temp 98.9 F (37.2 C) (Oral)   Resp 18   Ht 5\' 8"  (1.727 m)   Wt 99.8 kg (220 lb 0.3 oz)   SpO2 96%   BMI 33.45 kg/m   General: NAD Cardiovascular: RRR Respiratory: CTABL Abdomen: soft, lower abdoment  tenderness has almost resolved, no guarding , no rebound, + bs  Discharge Instructions You were cared for by a hospitalist during your hospital stay. If you have any questions about your discharge medications or the care you received while you were in the hospital after you are discharged, you can call the unit and asked to speak with the hospitalist on call if the hospitalist that took care of you is not available. Once you are discharged, your primary care physician will handle any further medical issues. Please note that NO REFILLS for any discharge medications will be authorized once you are discharged, as it is imperative that you return to your primary care physician (or establish a relationship with a primary care physician if you do not have one) for your aftercare needs so that they can reassess your need for medications and monitor your lab values.  Discharge Instructions    Diet - low sodium heart healthy   Complete by:  As directed    Increase activity slowly   Complete by:  As directed      Allergies as of 03/03/2018   No Known Allergies     Medication List    TAKE these medications   atorvastatin 40 MG tablet Commonly known as:  LIPITOR TAKE 1 TABLET(40 MG) BY MOUTH EVERY MORNING   carvedilol 25 MG tablet Commonly known as:  COREG TAKE 1 TABLET BY MOUTH TWICE DAILY WITH MEALS   ciprofloxacin 500 MG tablet Commonly known as:  CIPRO Take 1 tablet (500 mg total) by mouth 2 (two) times daily for 7 days.   diphenhydramine-acetaminophen 25-500 MG Tabs tablet Commonly known as:  TYLENOL PM Take 1 tablet by mouth at bedtime.   ferrous sulfate 325 (65 FE) MG tablet Take 1 tablet (325 mg total) by mouth every morning.   furosemide 40 MG tablet Commonly known as:  LASIX TAKE 1 TABLET(40 MG) BY MOUTH DAILY   lisinopril 40 MG tablet Commonly known as:  PRINIVIL,ZESTRIL TAKE 1 TABLET(40 MG) BY MOUTH DAILY   metroNIDAZOLE 500 MG tablet Commonly known as:  FLAGYL Take 1  tablet (500 mg total) by mouth 3 (three) times daily for 7 days.   nitroGLYCERIN 0.4 MG SL tablet Commonly known as:  NITROSTAT Place 1 tablet (0.4 mg total) under the tongue every 5 (five) minutes as needed for chest pain. X 3 doses   oxyCODONE-acetaminophen 5-325 MG tablet Commonly known as:  PERCOCET Take 1 tablet by mouth every 8 (eight) hours as needed for severe pain.   pantoprazole 40 MG tablet Commonly known as:  PROTONIX Take 1 tablet (40 mg total) by mouth daily.   polyvinyl alcohol 1.4 % ophthalmic solution Commonly known as:  LIQUIFILM TEARS Place 1 drop into the right eye daily.   potassium chloride SA 20 MEQ tablet Commonly known as:  K-DUR,KLOR-CON TAKE 1 TABLET(20 MEQ) BY MOUTH DAILY   XARELTO 20 MG Tabs tablet Generic drug:  rivaroxaban Take 1 tablet (20 mg total) by mouth daily.      No Known Allergies Follow-up Information    G[facility], [facility].  Call in 3 week(s).   Why:  for diverticulitis  Contact information: [place] [address] [phone]        Koirala, [name], MD Follow up in 1 week(s).   Specialty:  Family Medicine Why:  hospital discharge follow up, repeat cbc/bmp at discharge follow up. Contact information: [place] [address] [phone]            The results of significant diagnostics from this hospitalization (including imaging, microbiology, ancillary and laboratory) are listed below for reference.    Significant Diagnostic Studies: Ct Abdomen Pelvis W Contrast  Result Date: 02/28/2018 CLINICAL DATA:  Lower abdominal pain since Thursday. Nausea and vomiting. EXAM: CT ABDOMEN AND PELVIS WITH CONTRAST TECHNIQUE: Multidetector CT imaging of the abdomen and pelvis was performed using the standard protocol following bolus administration of intravenous contrast. CONTRAST:  177mL ISOVUE-300 IOPAMIDOL (ISOVUE-300) INJECTION 61% COMPARISON:  06/12/2015 FINDINGS: Lower  chest: Likely calcified nodule along the right major fissure at 2 mm. Mild cardiomegaly, without pericardial effusion. Right coronary artery atherosclerosis. Moderate hiatal hernia. Probable dilatation of the thoracic duct including at 1.6 cm on image 2/2. Hepatobiliary: Normal liver. 2 mm gallstone without acute cholecystitis or biliary duct dilatation. Pancreas: Normal, without mass or ductal dilatation. Spleen: Normal in size, without focal abnormality. Adrenals/Urinary Tract: Normal adrenal glands. Mild renal cortical thinning bilaterally. Bilateral too small to characterize renal lesions. An upper pole left renal lesion measures slightly greater than fluid density and 1.7 cm on image 22/2. When compared to 06/12/2015, most consistent with a minimally complex cyst. No hydronephrosis. Bladder demonstrates equivocal wall thickening on image 77/2. Mild adjacent edema. Stomach/Bowel: Normal remainder of the stomach. Scattered colonic diverticula. sigmoid wall thickening and moderate pericolonic edema, including on image 70/2. No pericolonic fluid collection or free extraluminal air. Normal terminal ileum and appendix. Normal small bowel. Vascular/Lymphatic: Aortic and branch vessel atherosclerosis. No abdominopelvic adenopathy. Reproductive: Normal prostate. Other: No significant free fluid. Musculoskeletal: Degenerate disc disease at L3-4. Convex left lumbar spine curvature. IMPRESSION: 1. Non complicated sigmoid diverticulitis. 2. Possible bladder wall thickening and adjacent edema. Question secondary cystitis. No well-defined fistulous communication. 3. Coronary artery atherosclerosis. Aortic Atherosclerosis (ICD10-I70.0). 4. Hiatal hernia. 5. Cholelithiasis. Electronically Signed   By: Abigail Miyamoto M.D.   On: 02/28/2018 14:54    Microbiology: No results found for this or any previous visit (from the past 240 hour(s)).   Labs: Basic Metabolic Panel: Recent Labs  Lab 02/28/18 1320 03/02/18 0429  03/03/18 0447  NA 143 144 144  K 4.6 4.2 3.7  CL 112* 110 111  CO2 21* 22 23  GLUCOSE 102* 94 87  BUN 21* 20 14  CREATININE 1.21 1.38* 1.19  CALCIUM 10.0 9.0 9.1  MG  --  1.7 1.7   Liver Function Tests: Recent Labs  Lab 02/28/18 1320  AST 15  ALT 15*  ALKPHOS 81  BILITOT 1.2  PROT 7.7  ALBUMIN 3.9   Recent Labs  Lab 02/28/18 1320  LIPASE 28   No results for input(s): AMMONIA in the last 168 hours. CBC: Recent Labs  Lab 02/28/18 1320 03/02/18 0429 03/03/18 0447  WBC 11.8* 8.0 6.4  NEUTROABS  --  6.3 4.2  HGB 14.5 12.2* 12.1*  HCT 43.6 38.2* 38.1*  MCV 96.5 98.7 96.5  PLT 167 150 139*   Cardiac Enzymes: No results for input(s): CKTOTAL, CKMB, CKMBINDEX, TROPONINI in the last 168 hours. BNP: BNP (last 3 results) No  results for input(s): BNP in the last 8760 hours.  ProBNP (last 3 results) No results for input(s): PROBNP in the last 8760 hours.  CBG: No results for input(s): GLUCAP in the last 168 hours.     Signed:  Florencia Reasons MD, PhD  [facility] 03/03/2018, 11:55 AM

## 2018-03-03 NOTE — Plan of Care (Signed)
Pt alert and oriented, states he is feeling better today.  Minimal complaints of abd pain. RN will monitor.

## 2018-03-03 NOTE — Care Management Important Message (Signed)
Important Message  Patient Details  Name: Peter [name] MRN: [id number] Date of Birth: [birth year]   Medicare Important Message Given:  Yes    Kerin Salen 03/03/2018, 10:58 AMImportant Message  Patient Details  Name: Peter [name] MRN: [id number] Date of Birth: [birth year]   Medicare Important Message Given:  Yes    Kerin Salen 03/03/2018, 10:57 AM

## 2018-03-03 NOTE — Progress Notes (Signed)
Pt alert and oriented, resting w minimal complaints of pain.  Discharge paperwork given and pt understands.  No questions at this time.

## 2018-03-09 ENCOUNTER — Other Ambulatory Visit: Payer: Self-pay

## 2018-03-09 NOTE — Patient Outreach (Signed)
[place] [facility]) Care Management  03/09/2018  JAMARI MOTEN [birth year] [id number]     EMMI-GENERAL DISCHARGE RED ON EMMI ALERT Day # 4 Date: 03/08/18 Red Alert Reason: " Unfilled prescriptions? Yes"   Outreach attempt # 1 to patient. No answer at present. RN CM left HIPAA compliant voicemail message along with contact info.         Plan: RN CM will make outreach attempt to patient within 3-4 business days.    Enzo Montgomery, RN,BSN,CCM [place] [facility] Telephonic Care Management Coordinator Direct Phone: [phone] Toll Free: [phone] Fax: [phone]

## 2018-03-09 NOTE — Patient Outreach (Signed)
[place] [facility]) Care Management  03/09/2018  Peter [name] 1946/01/12 [id number]   EMMI-GENERAL DISCHARGE RED ON EMMI ALERT Day # 4 Date: 03/08/18 Red Alert Reason: " Unfilled prescriptions? Yes"   Incoming call from patient. Patient voices he is doing better since return home. RN CM reviewed and addressed red alert with patient. He voices that response was a mistake. He states he picked up all his meds after discharge. He denies any questions or concerns regarding them. He completed antibiotic therapy today. He is pleased to report that he has had no pain and not needed to take any pain meds since return home. He has follow up appt next week with PCP and then PCP will be making referral to [facility] MD for follow up. He voices that he will have his spouse drive him to appts as he still feels a little. He reports that he is gradually increasing diet and reintroducing foods and tolerating them fine. He denies any further RN CM needs or concerns at this time. Patient has completed automated post discharge calls at this time. He was appreciative of follow up call.     Plan: RN CM will close case at this time as not further interventions needed.     Enzo Montgomery, RN,BSN,CCM [place] [facility] Telephonic Care Management Coordinator Direct Phone: [phone] Toll Free: [phone] Fax: [phone]

## 2018-03-30 DIAGNOSIS — I10 Essential (primary) hypertension: Secondary | ICD-10-CM | POA: Diagnosis not present

## 2018-03-30 DIAGNOSIS — Z79899 Other long term (current) drug therapy: Secondary | ICD-10-CM | POA: Diagnosis not present

## 2018-03-30 DIAGNOSIS — E7800 Pure hypercholesterolemia, unspecified: Secondary | ICD-10-CM | POA: Diagnosis not present

## 2018-03-30 DIAGNOSIS — R7301 Impaired fasting glucose: Secondary | ICD-10-CM | POA: Diagnosis not present

## 2018-03-30 DIAGNOSIS — Z0001 Encounter for general adult medical examination with abnormal findings: Secondary | ICD-10-CM | POA: Diagnosis not present

## 2018-03-30 DIAGNOSIS — I480 Paroxysmal atrial fibrillation: Secondary | ICD-10-CM | POA: Diagnosis not present

## 2018-03-30 DIAGNOSIS — N183 Chronic kidney disease, stage 3 (moderate): Secondary | ICD-10-CM | POA: Diagnosis not present

## 2018-03-30 DIAGNOSIS — I2510 Atherosclerotic heart disease of native coronary artery without angina pectoris: Secondary | ICD-10-CM | POA: Diagnosis not present

## 2018-06-29 DIAGNOSIS — R600 Localized edema: Secondary | ICD-10-CM | POA: Diagnosis not present

## 2018-06-29 DIAGNOSIS — Z7901 Long term (current) use of anticoagulants: Secondary | ICD-10-CM | POA: Diagnosis not present

## 2018-06-29 DIAGNOSIS — M25571 Pain in right ankle and joints of right foot: Secondary | ICD-10-CM | POA: Diagnosis not present

## 2018-07-03 ENCOUNTER — Encounter: Payer: Self-pay | Admitting: Podiatry

## 2018-07-03 ENCOUNTER — Other Ambulatory Visit: Payer: Self-pay | Admitting: Podiatry

## 2018-07-03 ENCOUNTER — Ambulatory Visit (INDEPENDENT_AMBULATORY_CARE_PROVIDER_SITE_OTHER): Payer: Medicare HMO | Admitting: Podiatry

## 2018-07-03 ENCOUNTER — Ambulatory Visit (INDEPENDENT_AMBULATORY_CARE_PROVIDER_SITE_OTHER): Payer: Medicare HMO

## 2018-07-03 DIAGNOSIS — M1000 Idiopathic gout, unspecified site: Secondary | ICD-10-CM | POA: Diagnosis not present

## 2018-07-03 DIAGNOSIS — R600 Localized edema: Secondary | ICD-10-CM

## 2018-07-03 DIAGNOSIS — M109 Gout, unspecified: Secondary | ICD-10-CM | POA: Diagnosis not present

## 2018-07-03 DIAGNOSIS — M779 Enthesopathy, unspecified: Secondary | ICD-10-CM | POA: Diagnosis not present

## 2018-07-03 MED ORDER — METHYLPREDNISOLONE 4 MG PO TBPK: ORAL_TABLET | ORAL | 0 refills | Status: DC

## 2018-07-03 MED ORDER — TRIAMCINOLONE ACETONIDE 10 MG/ML IJ SUSP
10.0000 mg | Freq: Once | INTRAMUSCULAR | Status: AC
  Administered 2018-07-03: 10 mg

## 2018-07-03 NOTE — Patient Instructions (Signed)

## 2018-07-05 NOTE — Progress Notes (Signed)
Subjective:   Patient ID: Peter [name], male   DOB: 72 y.o.   MRN: [id number]   HPI Patient presents with quite a bit of swelling on the medial side of the right ankle and stated it started a few days ago and he does not remember specific injury.  Does not remember ever having an event like this and is very sore when palpated making it very hard to walk   Review of Systems  All other systems reviewed and are negative.       Objective:  Physical Exam  Constitutional: He appears well-developed and well-nourished.  Cardiovascular: Intact distal pulses.  Pulmonary/Chest: Effort normal.  Musculoskeletal: Normal range of motion.  Neurological: He is alert.  Skin: Skin is warm.  Nursing note and vitals reviewed.   Neurovascular status intact muscle strength is adequate range of motion within normal limits with patient found to have quite a bit of inflammation fluid around the medial side of the right ankle.  He has lost some motion around this area but it appears to be due to splinting due to the pain that he is experiencing.  Patient has no other systemic signs of injury with no proximal edema erythema drainage noted     Assessment:  Possibility for acute gout attack right versus inflammatory tendinitis or posterior tibial tendinitis right     Plan:  H&P x-rays reviewed and today I am sending him for blood work to rule out arthritic event or gout and I did do an injection of the medial side right ankle right posterior tibial tendon 3 mg Kenalog problem Xylocaine applied an Unna boot to try to take some of the swelling calm the tissue down and continue with boot usage.  Reappoint 1 week to reevaluate  X-ray indicates no indications of any bony injury or significant depression of the arch

## 2018-07-07 LAB — URIC ACID: URIC ACID, SERUM: 7.1 mg/dL (ref 4.0–8.0)

## 2018-07-07 LAB — ANA: ANA: NEGATIVE

## 2018-07-07 LAB — C-REACTIVE PROTEIN: CRP: 37.3 mg/L — AB (ref <8.0)

## 2018-07-07 LAB — SEDIMENTATION RATE: SED RATE: 19 mm/h (ref 0–20)

## 2018-07-07 LAB — RHEUMATOID FACTOR: Rhuematoid fact SerPl-aCnc: <14 IU/mL (ref <14)

## 2018-07-10 ENCOUNTER — Encounter: Payer: Self-pay | Admitting: Podiatry

## 2018-07-10 ENCOUNTER — Ambulatory Visit: Payer: Medicare HMO | Admitting: Podiatry

## 2018-07-10 DIAGNOSIS — M7751 Other enthesopathy of right foot: Secondary | ICD-10-CM

## 2018-07-10 DIAGNOSIS — R600 Localized edema: Secondary | ICD-10-CM

## 2018-07-10 DIAGNOSIS — M1000 Idiopathic gout, unspecified site: Secondary | ICD-10-CM

## 2018-07-10 DIAGNOSIS — M779 Enthesopathy, unspecified: Secondary | ICD-10-CM

## 2018-07-10 MED ORDER — TRIAMCINOLONE ACETONIDE 10 MG/ML IJ SUSP
10.0000 mg | Freq: Once | INTRAMUSCULAR | Status: AC
  Administered 2018-07-10: 10 mg

## 2018-07-10 NOTE — Progress Notes (Signed)
Subjective:   Patient ID: Peter [name], male   DOB: 73 y.o.   MRN: [id number]   HPI Patient presents stating I seem to do well for a few days after the last treatment but then all of a sudden started getting sore again and is not the same place but it is higher up on the ankle   ROS      Objective:  Physical Exam  No change neurovascular status with exquisite discomfort occurring around the medial malleolus and just plantar to the medial malleolus with posterior tibial tendon significant reduction in inflammation.  Is very tender in this area is having trouble walking     Assessment:  Still appears to be an acute inflammatory condition with negative x-rays and no indication of gout currently with elevated C-reactive protein     Plan:  I reviewed blood work with him at this time I did do a careful injection around the medial malleolus and just plantar to it and reapplied Unna boot to try to reduce the swelling and gave him strict instructions that if problems were to occur that he is going to need an MRI or CT scan to try to do what is going on with him patient is given strict instructions to let me know if anything is going on

## 2018-08-05 IMAGING — CR DG CHEST 2V
2 series · 2 of 2 positions shown · non-contrast
Comparison: 02/03/2012

CLINICAL DATA: Heart attack 3 years ago. Increasing shortness of
breath and hard time getting a deep breath starting about 3 days
ago.

EXAM:
CHEST  2 VIEW

[w chest pa]
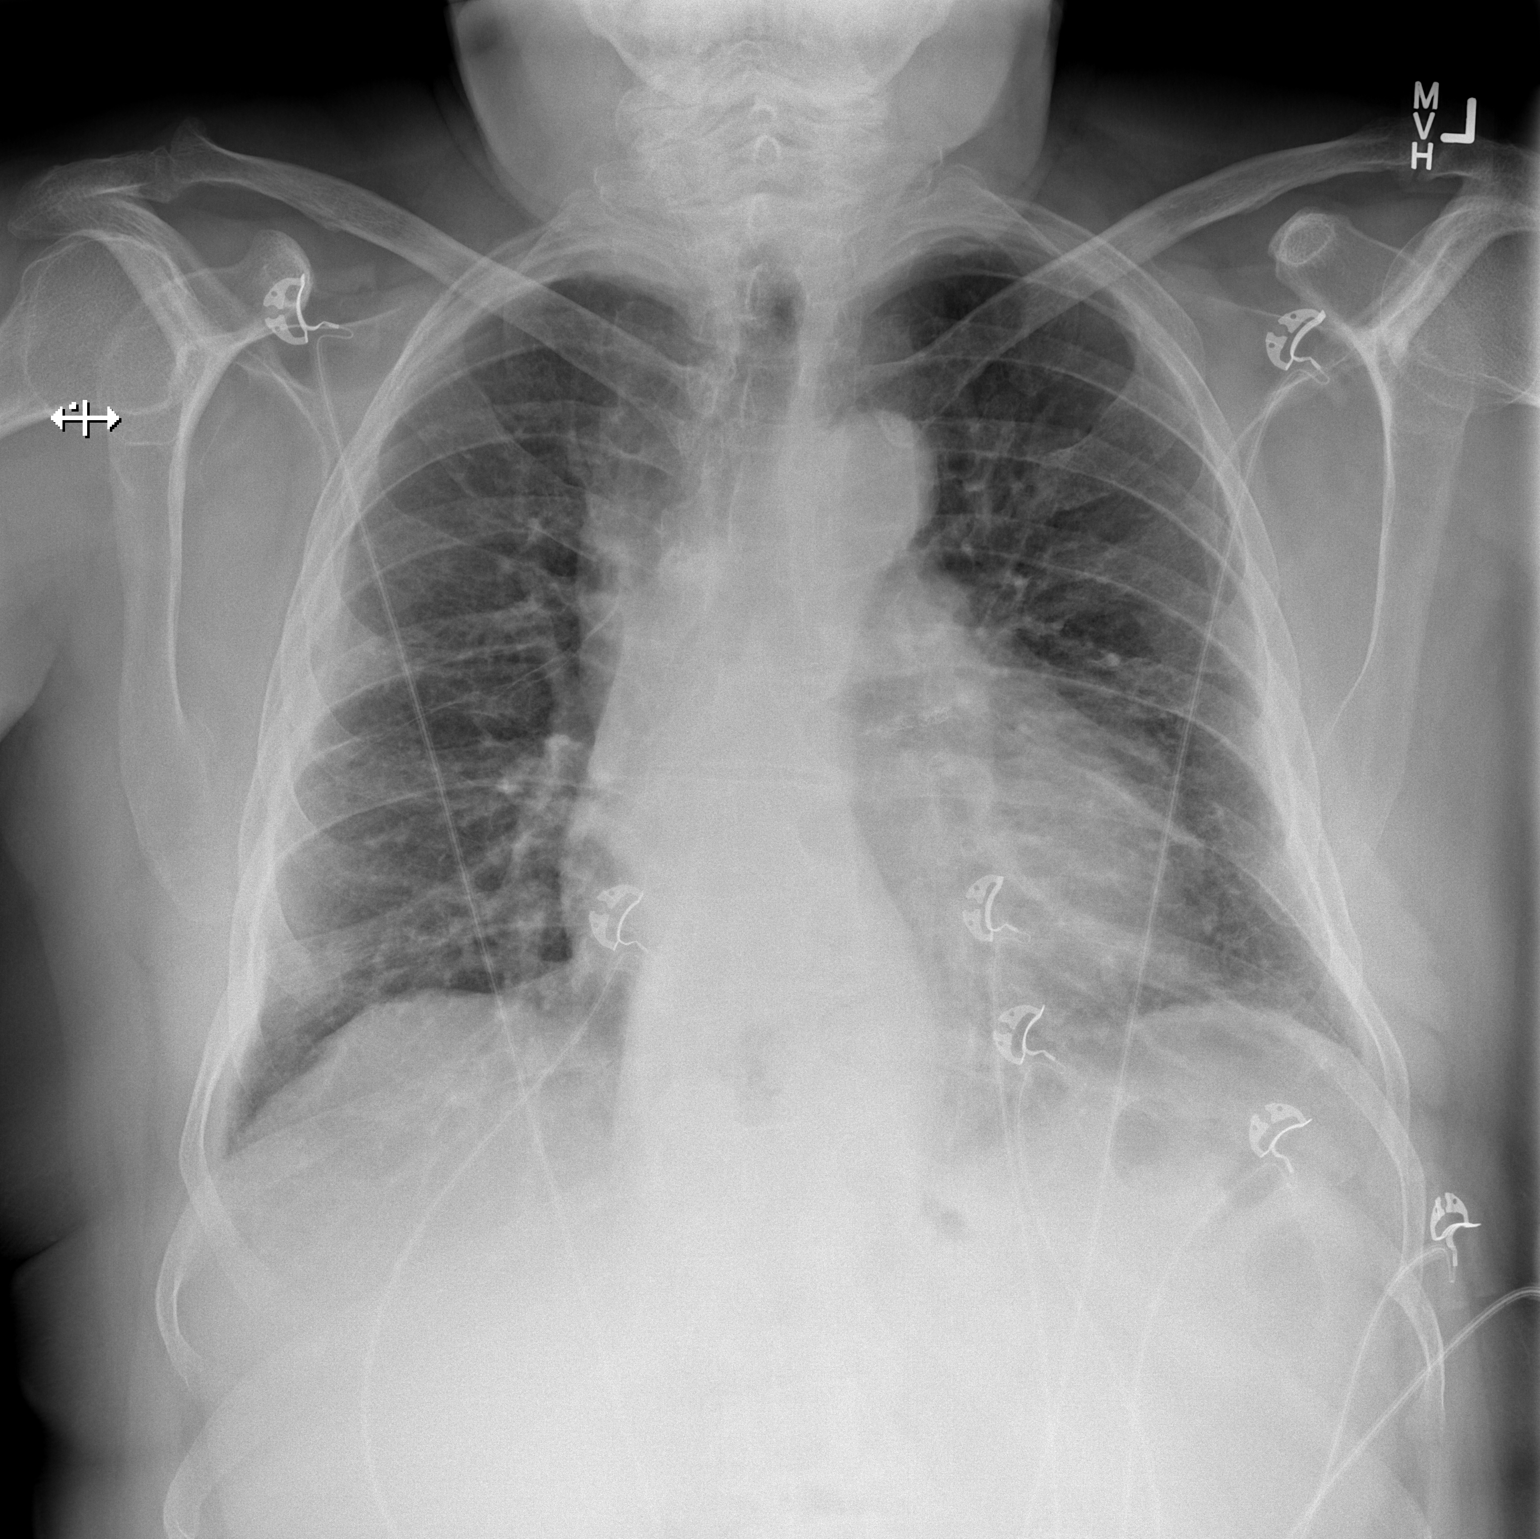

[w chest lat]
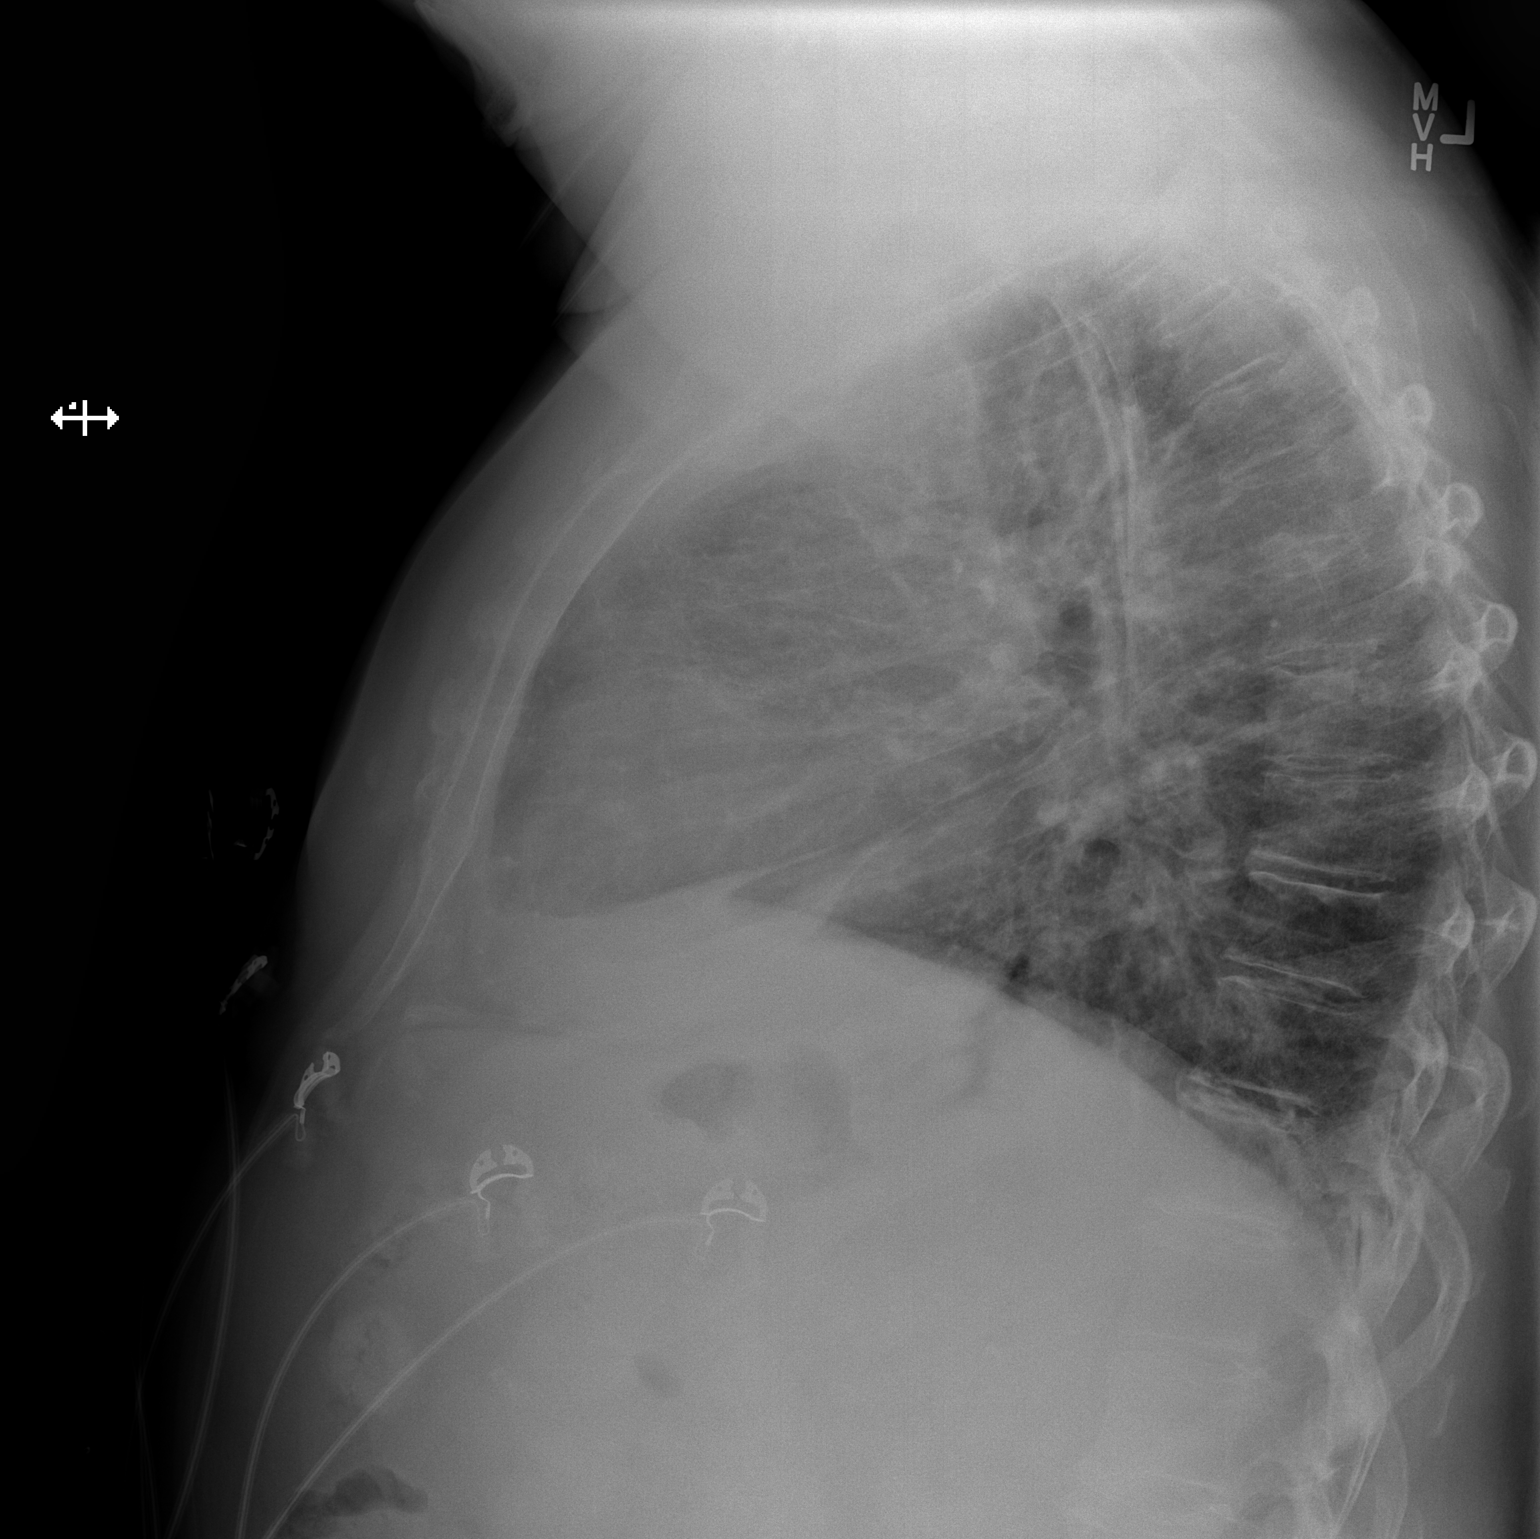

[2 of 2 positions shown; findings below may reference images not displayed]

FINDINGS: Low volume film. Cardiopericardial silhouette is at upper limits of
normal for size. There is pulmonary vascular congestion without
overt pulmonary edema. No focal airspace consolidation or pleural
effusion. Probable small hiatal hernia. The visualized bony
structures of the thorax are intact. Telemetry leads overlie the
chest.
IMPRESSION: Borderline cardiomegaly with vascular congestion.

## 2018-09-03 ENCOUNTER — Ambulatory Visit: Payer: Medicare HMO | Admitting: Cardiology

## 2018-09-03 ENCOUNTER — Encounter: Payer: Self-pay | Admitting: Cardiology

## 2018-09-03 VITALS — BP 122/70 | HR 63 | Ht 68.0 in | Wt 187.0 lb

## 2018-09-03 DIAGNOSIS — I2510 Atherosclerotic heart disease of native coronary artery without angina pectoris: Secondary | ICD-10-CM

## 2018-09-03 DIAGNOSIS — I4819 Other persistent atrial fibrillation: Secondary | ICD-10-CM | POA: Diagnosis not present

## 2018-09-03 DIAGNOSIS — I10 Essential (primary) hypertension: Secondary | ICD-10-CM | POA: Diagnosis not present

## 2018-09-03 DIAGNOSIS — I428 Other cardiomyopathies: Secondary | ICD-10-CM

## 2018-09-03 DIAGNOSIS — G4733 Obstructive sleep apnea (adult) (pediatric): Secondary | ICD-10-CM | POA: Diagnosis not present

## 2018-09-03 NOTE — Progress Notes (Signed)
Electrophysiology Office Note   Date:  09/03/2018   ID:  Peter [name], DOB Sep 27, 1946, MRN [id number]  PCP:  [name], MD  Cardiologist:  Marlou Porch Primary Electrophysiologist:  Constance Haw, MD    No chief complaint on file.    History of Present Illness: Peter [name] is a 72 y.o. male who presents today for electrophysiology evaluation.   History of CAD s/p PCI to LADx3 in 2006, previously chronic diastolic CHF, dyslipidemia, GERD, h/o GIB, HTN, hypertriglyceridemia, iron deficiency anemia, obesity, CKD stage III, recently diagnosed afib who was admitted with persistent afib (CHADSVASC 4). AF ablation 10/24/16.   Today, denies symptoms of palpitations, chest pain, shortness of breath, orthopnea, PND, lower extremity edema, claudication, dizziness, presyncope, syncope, bleeding, or neurologic sequela. The patient is tolerating medications without difficulties.  Overall he is feeling well.  He has noted no further episodes of atrial fibrillation.  He is able to do all of his daily activities without restriction.  He has been losing weight over the last few months.  He has lost up to 40 pounds and is feeling well without weakness, or fatigue.  He has quite a bit more energy.   Past Medical History:  Diagnosis Date  . At risk for sleep apnea    STOP-BANG= 5       SENT TO PCP 07-12-2015  . CAD (coronary artery disease)    a. s/p PCI with DES x 3 to LAD in 2006 with bifurcation lesion and Plavix indefinitely was recommended.  . Chronic combined systolic and diastolic CHF (congestive heart failure) ([place])    a. Prior EF normal but EF dropped to 30-35% in 07/2016 in setting of AFIB.  . CKD (chronic kidney disease), stage III ([place])   . Dyslipidemia   . Dyspnea    tired quick d/t Afib  . Dyspnea on exertion   . GERD (gastroesophageal reflux disease)   . History of GI bleed   . History of kidney stones   . History of MI (myocardial infarction)    11-05-2005--   periprocedural MI  (cardiac cath) per dr [name] note  . Hypertension   . Hypertriglyceridemia   . Iron deficiency anemia   . Myocardial infarction ([place])   . Persistent atrial fibrillation    a. 07/2016, started on Eliquis -> readmitted later that month for AF RVR/acute HF - initially unsuccessful DCCV following TEE, so loaded with amio with repeat successful DCCV 07/2816.  . Right ureteral stone   . S/P drug eluting coronary stent placement    x3  to LAD  2006  . Sleep apnea    wears BIPAP, 14 up to 24   Past Surgical History:  Procedure Laterality Date  . CARDIOVASCULAR STRESS TEST  01-06-2013  dr [name]   normal perfusion study/  no ischemia or infarct/  normal LV function and wall motion , ef 57%  . CARDIOVERSION N/A 08/05/2016   Procedure: CARDIOVERSION;  Surgeon: Sanda Klein, MD;  Location: [facility];  Service: Cardiovascular;  Laterality: N/A;  . CARDIOVERSION N/A 08/08/2016   Procedure: CARDIOVERSION;  Surgeon: Jerline Pain, MD;  Location: [facility] ENDOSCOPY;  Service: Cardiovascular;  Laterality: N/A;  . CORONARY ANGIOPLASTY  11-13-2005 dr [name]   Successful touch up post-dilatation of the 3 tandem overlying Cypher stents in proximal to mid LAD (based on IVUS 0% stenosis and patent diagonal branch)/  Cutting Balloon Angioplasty of Ramus branch  . CORONARY ANGIOPLASTY WITH STENT PLACEMENT  11-05-2005   dr  bruce brodie   PCI and DES x3 to birfurcation lesion LAD and diagonal branch of LAD/   20% dLM,  30-40% RCA, ef 60%  . CYSTOSCOPY WITH RETROGRADE PYELOGRAM, URETEROSCOPY AND STENT PLACEMENT Right 07/14/2015   Procedure: CYSTOSCOPY WITH RETROGRADE PYELOGRAM, URETEROSCOPY AND STENT PLACEMENT;  Surgeon: Alexis Frock, MD;  Location: [facility];  Service: Urology;  Laterality: Right;  . ELECTROPHYSIOLOGIC STUDY N/A 10/24/2016   Procedure: Atrial Fibrillation Ablation;  Surgeon: Jakaiya Netherland Meredith Leeds, MD;  Location: [place] [facility] CV LAB;  Service: Cardiovascular;  Laterality:  N/A;  . EXTRACORPOREAL SHOCK WAVE LITHOTRIPSY  yrs ago  . HEMORRHOID SURGERY N/A 10/24/2017   Procedure: HEMORRHOIDECTOMY;  Surgeon: Ileana Roup, MD;  Location: [facility];  Service: General;  Laterality: N/A;  . IR GENERIC HISTORICAL  10/23/2016   IR FLUORO GUIDE CV LINE RIGHT 10/23/2016 Greggory Keen, MD [facility]  . IR GENERIC HISTORICAL  10/23/2016   IR US GUIDE VASC ACCESS RIGHT 10/23/2016 Greggory Keen, MD [facility]  . LEFT URETEROSCOPIC STONE EXTRACTION  04-10-2006  . STONE EXTRACTION WITH BASKET Right 07/14/2015   Procedure: STONE EXTRACTION WITH BASKET;  Surgeon: Alexis Frock, MD;  Location: [facility];  Service: Urology;  Laterality: Right;  . TEE WITHOUT CARDIOVERSION N/A 08/05/2016   Procedure: TRANSESOPHAGEAL ECHOCARDIOGRAM (TEE);  Surgeon: Sanda Klein, MD;  Location: [facility] ENDOSCOPY;  Service: Cardiovascular;  Laterality: N/A;  . TRANSTHORACIC ECHOCARDIOGRAM  02-04-2012   grade I diastolic dysfunction/  ef 55-60%/  trivial MR and TR/  mild LAE     Current Outpatient Medications  Medication Sig Dispense Refill  . atorvastatin (LIPITOR) 40 MG tablet TAKE 1 TABLET(40 MG) BY MOUTH EVERY MORNING 90 tablet 3  . carvedilol (COREG) 25 MG tablet TAKE 1 TABLET BY MOUTH TWICE DAILY WITH MEALS 60 tablet 10  . diphenhydramine-acetaminophen (TYLENOL PM) 25-500 MG TABS tablet Take 1 tablet by mouth at bedtime.    . ferrous sulfate 325 (65 FE) MG tablet Take 1 tablet (325 mg total) by mouth every morning. 90 tablet 3  . furosemide (LASIX) 40 MG tablet TAKE 1 TABLET(40 MG) BY MOUTH DAILY 90 tablet 3  . lisinopril (PRINIVIL,ZESTRIL) 40 MG tablet TAKE 1 TABLET(40 MG) BY MOUTH DAILY 90 tablet 3  . nitroGLYCERIN (NITROSTAT) 0.4 MG SL tablet Place 1 tablet (0.4 mg total) under the tongue every 5 (five) minutes as needed for chest pain. X 3 doses 25 tablet prn  . pantoprazole (PROTONIX) 40 MG tablet Take 1 tablet (40 mg total) by mouth daily. 90 tablet 3  . polyvinyl  alcohol (LIQUIFILM TEARS) 1.4 % ophthalmic solution Place 1 drop into the right eye daily.     . potassium chloride SA (K-DUR,KLOR-CON) 20 MEQ tablet TAKE 1 TABLET(20 MEQ) BY MOUTH DAILY 90 tablet 3  . XARELTO 20 MG TABS tablet Take 1 tablet (20 mg total) by mouth daily. 90 tablet 3   No current facility-administered medications for this visit.     Allergies:   Patient has no known allergies.   Social History:  The patient  reports that he has never smoked. He has never used smokeless tobacco. He reports that he does not drink alcohol or use drugs.   Family History:  The patient's family history includes Cancer in his other; Congestive Heart Failure (age of onset: 1) in his father; Heart attack (age of onset: 64) in his father.    ROS:  Please see the history of present illness.   Otherwise, review  of systems is positive for none.   All other systems are reviewed and negative.   PHYSICAL EXAM: VS:  BP 122/70   Pulse 63   Ht 5\' 8"  (1.727 m)   Wt 187 lb (84.8 kg)   BMI 28.43 kg/m  , BMI Body mass index is 28.43 kg/m. GEN: Well nourished, well developed, in no acute distress  HEENT: normal  Neck: no JVD, carotid bruits, or masses Cardiac: RRR; no murmurs, rubs, or gallops,no edema  Respiratory:  clear to auscultation bilaterally, normal work of breathing GI: soft, nontender, nondistended, + BS MS: no deformity or atrophy  Skin: warm and dry Neuro:  Strength and sensation are intact Psych: euthymic mood, full affect  EKG:  EKG is ordered today. Personal review of the ekg ordered shows this rhythm, rate 63  Recent Labs: 02/28/2018: ALT 15 03/03/2018: BUN 14; Creatinine, Ser 1.19; Hemoglobin 12.1; Magnesium 1.7; Platelets 139; Potassium 3.7; Sodium 144    Lipid Panel     Component Value Date/Time   CHOL 140 01/01/2013 1037   TRIG 230.0 (H) 01/01/2013 1037   HDL 38.20 (L) 01/01/2013 1037   CHOLHDL 4 01/01/2013 1037   VLDL 46.0 (H) 01/01/2013 1037   LDLDIRECT 68.6 01/01/2013  1037     Wt Readings from Last 3 Encounters:  09/03/18 187 lb (84.8 kg)  03/03/18 220 lb 0.3 oz (99.8 kg)  11/13/17 233 lb (105.7 kg)      Other studies Reviewed: Additional studies/ records that were reviewed today include: TEE 11/22/16 Review of the above records today demonstrates:  - Left ventricle: The cavity size was normal. Wall thickness was   increased in a pattern of mild LVH. Systolic function was normal.   The estimated ejection fraction was in the range of 55% to 60%.   Left ventricular diastolic function parameters were normal. - Mitral valve: Calcified annulus. Mildly thickened leaflets . - Left atrium: The atrium was mildly dilated. - Atrial septum: No defect or patent foramen ovale was identified.   ASSESSMENT AND PLAN:  1.  Persistent atrial fibrillation/flutter: Coreg and Xarelto.  Status post ablation.  No further episodes of atrial fibrillation.  Continue current management.  This patients CHA2DS2-VASc Score and unadjusted Ischemic Stroke Rate (% per year) is equal to 4.8 % stroke rate/year from a score of 4  Above score calculated as 1 point each if present [CHF, HTN, DM, Vascular=MI/PAD/Aortic Plaque, Age if 65-74, or Male] Above score calculated as 2 points each if present [Age > 75, or Stroke/TIA/TE]  2. Likely nonischemic cardiomyopathy: Ejection fraction has improved and normal.  No changes.  3. HTN: Well-controlled.  No changes.  4. CAD s/p PCI to LAD in 2006: Asymptomatic.  No changes.  5. OSA: On BiPAP.  Appliance encouraged  Current medicines are reviewed at length with the patient today.   The patient does not have concerns regarding his medicines.  The following changes were made today: None  Labs/ tests ordered today include:  Orders Placed This Encounter  Procedures  . EKG 12-Lead     Disposition:   FU with Shadana Pry 12 months  Signed, Tim Wilhide Meredith Leeds, MD  09/03/2018 12:00 PM     [address] [place] [phone] (office) [phone] (fax)

## 2018-09-03 NOTE — Patient Instructions (Signed)
Medication Instructions:  Your physician recommends that you continue on your current medications as directed. Please refer to the Current Medication list given to you today.  If you need a refill on your cardiac medications before your next appointment, please call your pharmacy.   Lab work: None ordered  Testing/Procedures: None ordered  Follow-Up: At [facility], you and your health needs are our priority.  As part of our continuing mission to provide you with exceptional heart care, we have created designated Provider Care Teams.  These Care Teams include your primary Cardiologist (physician) and Advanced Practice Providers (APPs -  Physician Assistants and Nurse Practitioners) who all work together to provide you with the care you need, when you need it. You will need a follow up appointment in 1 years.  Please call our office 2 months in advance to schedule this appointment.  You may see Will Martin Camnitz, MD or one of the following Advanced Practice Providers on your designated Care Team:   Amber Seiler, NP . Renee Ursuy, PA-C  Thank you for choosing [facility]!!   Eduarda Scrivens, RN [phone]     

## 2018-10-12 ENCOUNTER — Other Ambulatory Visit: Payer: Self-pay | Admitting: Cardiology

## 2018-10-12 DIAGNOSIS — I480 Paroxysmal atrial fibrillation: Secondary | ICD-10-CM

## 2018-10-12 MED ORDER — XARELTO 20 MG PO TABS: 20.0000 mg | ORAL_TABLET | Freq: Every day | ORAL | 3 refills | Status: DC

## 2018-10-27 ENCOUNTER — Other Ambulatory Visit: Payer: Self-pay | Admitting: Cardiology

## 2018-10-27 DIAGNOSIS — I480 Paroxysmal atrial fibrillation: Secondary | ICD-10-CM

## 2018-10-27 MED ORDER — CARVEDILOL 25 MG PO TABS: 25.0000 mg | ORAL_TABLET | Freq: Two times a day (BID) | ORAL | 2 refills | Status: DC

## 2018-10-27 MED ORDER — XARELTO 20 MG PO TABS: 20.0000 mg | ORAL_TABLET | Freq: Every day | ORAL | 1 refills | Status: DC

## 2018-10-27 MED ORDER — POTASSIUM CHLORIDE CRYS ER 20 MEQ PO TBCR: EXTENDED_RELEASE_TABLET | ORAL | 2 refills | Status: DC

## 2018-10-27 MED ORDER — FUROSEMIDE 40 MG PO TABS: ORAL_TABLET | ORAL | 2 refills | Status: DC

## 2018-10-27 MED ORDER — FERROUS SULFATE 325 (65 FE) MG PO TABS: 325.0000 mg | ORAL_TABLET | Freq: Every morning | ORAL | 2 refills | Status: AC

## 2018-10-27 MED ORDER — ATORVASTATIN CALCIUM 40 MG PO TABS: ORAL_TABLET | ORAL | 2 refills | Status: DC

## 2018-10-27 MED ORDER — PANTOPRAZOLE SODIUM 40 MG PO TBEC: 40.0000 mg | DELAYED_RELEASE_TABLET | Freq: Every day | ORAL | 2 refills | Status: DC

## 2018-10-27 MED ORDER — LISINOPRIL 40 MG PO TABS: ORAL_TABLET | ORAL | 2 refills | Status: DC

## 2018-10-27 NOTE — Telephone Encounter (Signed)
Pt needing a refill on Xarelto sent to [facility] mail order pharmacy. Please address

## 2018-10-27 NOTE — Telephone Encounter (Signed)
Pt last saw Dr [name] 09/03/18, last labs 03/03/18 Creat 1.19, age 72, weight 84.8kg, CrCl 66.31, based on CrCl pt is on appropriate dosage of Xarelto 20mg  QD.  Will refill rx.

## 2018-10-29 ENCOUNTER — Ambulatory Visit: Payer: Medicare HMO

## 2018-10-29 ENCOUNTER — Ambulatory Visit: Payer: Medicare HMO | Admitting: Podiatry

## 2018-10-29 ENCOUNTER — Encounter: Payer: Self-pay | Admitting: Podiatry

## 2018-10-29 DIAGNOSIS — R600 Localized edema: Secondary | ICD-10-CM | POA: Diagnosis not present

## 2018-10-29 DIAGNOSIS — M1000 Idiopathic gout, unspecified site: Secondary | ICD-10-CM

## 2018-10-29 DIAGNOSIS — M779 Enthesopathy, unspecified: Secondary | ICD-10-CM

## 2018-10-29 DIAGNOSIS — M79671 Pain in right foot: Secondary | ICD-10-CM

## 2018-10-29 DIAGNOSIS — M109 Gout, unspecified: Secondary | ICD-10-CM

## 2018-10-29 MED ORDER — METHYLPREDNISOLONE 4 MG PO TBPK: ORAL_TABLET | ORAL | 0 refills | Status: DC

## 2018-10-29 MED ORDER — TRIAMCINOLONE ACETONIDE 10 MG/ML IJ SUSP: 10.0000 mg | Freq: Once | INTRAMUSCULAR | Status: DC

## 2018-10-29 MED ORDER — TRIAMCINOLONE ACETONIDE 10 MG/ML IJ SUSP
10.0000 mg | Freq: Once | INTRAMUSCULAR | Status: DC
Start: 2018-10-29 — End: 2023-12-19

## 2018-10-30 LAB — URIC ACID: Uric Acid, Serum: 8.7 mg/dL — ABNORMAL HIGH (ref 4.0–8.0)

## 2018-10-30 LAB — C-REACTIVE PROTEIN: CRP: 5 mg/L (ref <8.0)

## 2018-10-30 LAB — SEDIMENTATION RATE: SED RATE: 6 mm/h (ref 0–20)

## 2018-10-31 NOTE — Progress Notes (Signed)
Subjective:   Patient ID: Peter [name], male   DOB: 72 y.o.   MRN: [id number]   HPI Patient presents stating he thinks he is getting a flareup of his ankle and its been hurting him for several days.  He cannot get out of bed and does not remember specific injury to this area and it is also swollen and the wrap seemed to help last time   ROS      Objective:  Physical Exam  Neurovascular status intact muscle strength is adequate patient found to have redness on the medial side of the dorsal foot with inflammation fluid buildup and around the ankle.  This does seem to have flared up recently and seems to be making it difficult for him to walk     Assessment:  Probability for acute inflammation of the right ankle with inflammatory changes with strong possibility that gout or systemic inflammatory condition is part of this     Plan:  H&P condition reviewed and at this time I did do an injection carefully around the posterior tibial tendon at the area of maximum inflammation.  I also placed him on 6-day steroid pack and applied Unna boot to try to reduce the swelling associated with it and I am sending him for blood work to rule out that there is not a systemic pathology occurring here.  Reappoint in 2 weeks or earlier if needed  X-ray was negative for any changes consistent with fracture or ostial lysis or advanced arthritis

## 2018-11-12 IMAGING — CT CT HEART MORPH/PULM VEIN W/ CM & W/O CA SCORE
1 of 10 series · 1 of 20 positions shown, 2 images · IV contrast (Iodine)
Comparison: None.

CLINICAL DATA: Pre Ablation

EXAM:
Cardiac CTA
MEDICATIONS:
10 mg iv lopressor due to rapid afib. IR radiology needed to start
an iv with US guidance
TECHNIQUE: The patient was scanned on a [employer] [REDACTED]ice scanner. Gantry
rotation speed was 270 msecs. Collimation was .9mm. A 100 kV
prospective scan was triggered in the descending thoracic aorta at
111 HU's with 5% padding centered around 78% of the R-R interval.
Average HR during the scan was 88 bpm. The 3D data set was
interpreted on a dedicated work station using MPR, MIP and VRT
modes. A total of 80cc of contrast was used.

[Series 300: locator · axial · 0.35mm/px · z∈[+978,+978]mm · 1 of 1 slices shown, 2 images]
[im 1/1  vessel]
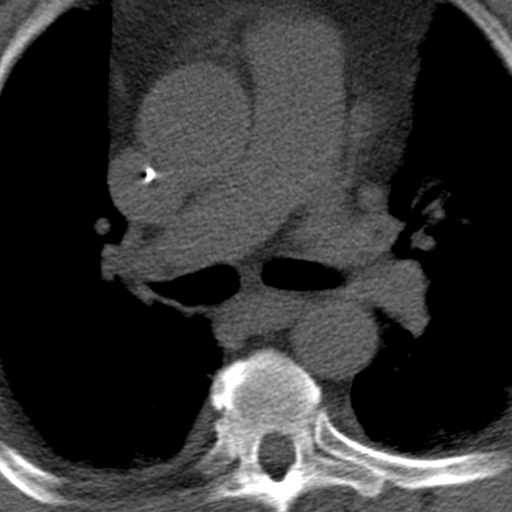
[im 1/1  lung]
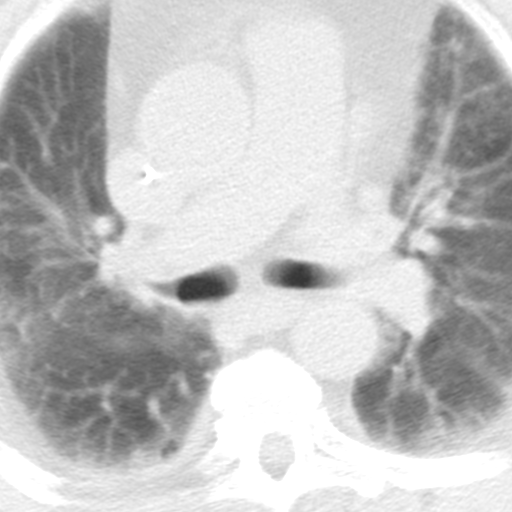

[1 of 20 positions shown; findings below may reference images not displayed]

FINDINGS: Non-cardiac: See separate report from [REDACTED]. No
significant findings on limited lung and soft tissue windows.

Aortic root dilated at 4.0 cm

There was moderate biatrial enlargement The LV appeared enlarged.
The CASILLAS had no thrombus. There was no ASD/VSD or pericardial
effusion. The esophagus coursed closest to the ostium of the LLPV

The pulmonary veins drained normally into the LA with no anomalies
Measurements as below

LUPV:  Ostium 16.5 mm Area 2.3 cm2

LLPV:  Ostium 17.1 mm Area 2.4 cm2

RUPV:  Ostium 21.1 mm Area 4.0 cm2

RLPV:  Ostium 16.6 mm Area 2.1 cm2
IMPRESSION: 1) No CASILLAS thrombus

2) Normal pulmonary veins with no anomaly measurements as above

3) Moderate biatrial enlargement

4) No pericardial effusion

5) Esophagus courses closest to the LLPV ostium

Adebisi Mcmurray

EXAM:
OVER-READ INTERPRETATION  CT CHEST

The following report is an over-read performed by radiologist Dr.
Vianey Loft [REDACTED] on 10/23/2016. This over-read
does not include interpretation of cardiac or coronary anatomy or
pathology. The coronary CTA interpretation by the cardiologist is
attached.
FINDINGS: Cardiovascular: Ascending aortic diameter is 4 cm. Visualized
descending thoracic aortic diameter and aortic arch are normal
caliber. Heart is borderline in size.

Mediastinum/Nodes: Small scattered mediastinal lymph nodes, none
pathologically enlarged. No visible hilar adenopathy. Small to
moderate-sized hiatal hernia.

Lungs/Pleura: Peripheral interlobular septal thickening and
ground-glass densities in the lungs likely reflects scarring and
chronic lung disease. Somewhat more focal opacity peripherally in
the right upper lobe could reflect early pneumonia or be related to
scarring. Trace right pleural effusion.

Upper Abdomen: Imaging into the upper abdomen shows no acute
findings.

Musculoskeletal: Chest wall soft tissues are unremarkable. No acute
bony abnormality or focal bone lesion.
IMPRESSION: 4.0 cm ascending thoracic aortic aneurysm. Recommend annual imaging
followup by CTA or MRA. This recommendation follows 9777
ACCF/AHA/AATS/ACR/ASA/SCA/FELISA/MECEDES/HOLM/AUJLA Guidelines for the
Diagnosis and Management of Patients with Thoracic Aortic Disease.
Circulation. 9777; 121: [id number]

Peripheral opacities in the lungs, favor scarring. Peripheral
wedge-shaped opacity in the right upper lobe could reflect scarring
or developing pneumonia.

Trace right pleural effusion.

Small to moderate hiatal hernia.

## 2018-11-19 ENCOUNTER — Ambulatory Visit: Payer: Medicare HMO | Admitting: Podiatry

## 2019-02-04 DIAGNOSIS — K5792 Diverticulitis of intestine, part unspecified, without perforation or abscess without bleeding: Secondary | ICD-10-CM | POA: Diagnosis not present

## 2019-02-04 DIAGNOSIS — R197 Diarrhea, unspecified: Secondary | ICD-10-CM | POA: Diagnosis not present

## 2019-03-10 ENCOUNTER — Ambulatory Visit: Payer: Medicare HMO | Admitting: Podiatry

## 2019-03-10 ENCOUNTER — Encounter: Payer: Self-pay | Admitting: Podiatry

## 2019-03-10 ENCOUNTER — Other Ambulatory Visit: Payer: Self-pay

## 2019-03-10 ENCOUNTER — Ambulatory Visit: Payer: Medicare HMO

## 2019-03-10 VITALS — Temp 97.3°F

## 2019-03-10 DIAGNOSIS — M25571 Pain in right ankle and joints of right foot: Secondary | ICD-10-CM

## 2019-03-10 DIAGNOSIS — M7752 Other enthesopathy of left foot: Secondary | ICD-10-CM

## 2019-03-10 DIAGNOSIS — M109 Gout, unspecified: Secondary | ICD-10-CM | POA: Diagnosis not present

## 2019-03-10 DIAGNOSIS — M779 Enthesopathy, unspecified: Secondary | ICD-10-CM

## 2019-03-10 MED ORDER — ALLOPURINOL 100 MG PO TABS
100.0000 mg | ORAL_TABLET | Freq: Every day | ORAL | 6 refills | Status: DC
Start: 2019-03-10 — End: 2019-06-02

## 2019-03-10 MED ORDER — TRIAMCINOLONE ACETONIDE 10 MG/ML IJ SUSP
10.0000 mg | Freq: Once | INTRAMUSCULAR | Status: AC
  Administered 2019-03-10: 16:00:00 10 mg

## 2019-03-10 MED ORDER — COLCHICINE 0.6 MG PO TABS: 0.6000 mg | ORAL_TABLET | Freq: Two times a day (BID) | ORAL | 2 refills | Status: DC

## 2019-03-11 NOTE — Progress Notes (Signed)
Subjective:   Patient ID: Peter [name], male   DOB: 73 y.o.   MRN: [id number]   HPI Patient states is been 4 months and I developed another gout attack or other unknown inflammation and it is extremely sore   ROS      Objective:  Physical Exam  Neurovascular status intact with patient's medial ankle right showing red and inflamed painful tissue and slight pain also in the lateral side of the ankle     Assessment:  Probability for acute tendinitis right with probability for systemic gout condition     Plan:  H&P condition reviewed and sterile prep and careful injection administered 3 mg Kenalog 5 mg Xylocaine.  I then discussed gout and we are going to start him on medicine I am to start him on allopurinol to be taken regular and colchicine for when he develops attacks.  I educated on both of these and sent prescriptions and and he will be seen back as needed

## 2019-04-09 ENCOUNTER — Telehealth: Payer: Self-pay | Admitting: Podiatry

## 2019-04-09 NOTE — Telephone Encounter (Signed)
error 

## 2019-04-14 ENCOUNTER — Ambulatory Visit
Admission: RE | Admit: 2019-04-14 | Discharge: 2019-04-14 | Disposition: A | Discharge status: Home / Self Care | Payer: Medicare HMO | Source: Ambulatory Visit | Attending: Family Medicine | Admitting: Family Medicine

## 2019-04-14 ENCOUNTER — Other Ambulatory Visit: Payer: Self-pay | Admitting: Family Medicine

## 2019-04-14 DIAGNOSIS — M25571 Pain in right ankle and joints of right foot: Secondary | ICD-10-CM | POA: Diagnosis not present

## 2019-04-14 DIAGNOSIS — N183 Chronic kidney disease, stage 3 (moderate): Secondary | ICD-10-CM | POA: Diagnosis not present

## 2019-04-14 DIAGNOSIS — I10 Essential (primary) hypertension: Secondary | ICD-10-CM | POA: Diagnosis not present

## 2019-04-14 DIAGNOSIS — K219 Gastro-esophageal reflux disease without esophagitis: Secondary | ICD-10-CM | POA: Diagnosis not present

## 2019-04-14 DIAGNOSIS — E7800 Pure hypercholesterolemia, unspecified: Secondary | ICD-10-CM | POA: Diagnosis not present

## 2019-04-14 DIAGNOSIS — I480 Paroxysmal atrial fibrillation: Secondary | ICD-10-CM | POA: Diagnosis not present

## 2019-04-14 DIAGNOSIS — I2510 Atherosclerotic heart disease of native coronary artery without angina pectoris: Secondary | ICD-10-CM | POA: Diagnosis not present

## 2019-04-14 DIAGNOSIS — Z0001 Encounter for general adult medical examination with abnormal findings: Secondary | ICD-10-CM | POA: Diagnosis not present

## 2019-05-07 ENCOUNTER — Other Ambulatory Visit: Payer: Self-pay | Admitting: Cardiology

## 2019-05-07 DIAGNOSIS — I480 Paroxysmal atrial fibrillation: Secondary | ICD-10-CM

## 2019-05-10 NOTE — Telephone Encounter (Signed)
Prescription refill request for Xarelto received.   Last office visit: Dr. [name] (09-03-2018) Weight: 84.8 kg (09-03-2018) Age: 73 y.o. Scr: 1.3 ([facility]ia [facility] 04-14-2019) CrCl: 60 ml/min  Prescription refill sent.

## 2019-05-24 ENCOUNTER — Telehealth: Payer: Self-pay | Admitting: Cardiovascular Disease

## 2019-05-24 NOTE — Telephone Encounter (Signed)
°  Patient [facility] Claiborne [facility]. He needs a new mask for his cpap machine, and the company who sends the supplies told the patient he needs a new prescription.

## 2019-06-02 ENCOUNTER — Encounter: Payer: Self-pay | Admitting: Cardiovascular Disease

## 2019-06-02 ENCOUNTER — Other Ambulatory Visit: Payer: Self-pay

## 2019-06-02 ENCOUNTER — Ambulatory Visit (INDEPENDENT_AMBULATORY_CARE_PROVIDER_SITE_OTHER): Payer: Medicare HMO | Admitting: Cardiovascular Disease

## 2019-06-02 ENCOUNTER — Encounter (INDEPENDENT_AMBULATORY_CARE_PROVIDER_SITE_OTHER): Payer: Self-pay

## 2019-06-02 VITALS — BP 119/82 | HR 63 | Ht 68.0 in | Wt 193.4 lb

## 2019-06-02 DIAGNOSIS — I2510 Atherosclerotic heart disease of native coronary artery without angina pectoris: Secondary | ICD-10-CM

## 2019-06-02 DIAGNOSIS — I480 Paroxysmal atrial fibrillation: Secondary | ICD-10-CM | POA: Diagnosis not present

## 2019-06-02 DIAGNOSIS — G4733 Obstructive sleep apnea (adult) (pediatric): Secondary | ICD-10-CM

## 2019-06-02 DIAGNOSIS — I10 Essential (primary) hypertension: Secondary | ICD-10-CM

## 2019-06-02 NOTE — Progress Notes (Signed)
Cardiology Office Note    Date:  06/04/2019   ID:  Peter [name], DOB Mar 13, 1946, MRN [id number]  PCP:  [name], MD  Cardiologist:  [name], MD (sleep); [name]  F/U sleep clinic evaluation  History of Present Illness:  Peter [name] is a 73 y.o. male who is followed by Dr. Candee Furbish for cardiology care.    Peter [name] has a history of CAD and is status post PCI to his LAD in 2006, and is status post atrial fibrillation ablation in December 2017.  He has a history of chronic systolic heart failure, remote GI bleed, GERD, iron deficiency anemia, and hyperlipidemia.    Due to his significant atrial fibrillation history, he was referred for a sleep study which was done as a split-night protocol on 10/09/2016.  This revealed severe sleep apnea with an AHI of 84.2 per hour.  He was unable to achieve rems sleep on the diagnostic portion of the study.  He at significant oxygen desaturation to 63% with time below 88% at 53.9 minutes.  CPAP was implemented and he was titrated up to 19 cm water pressure; however, due to continued events he was switched to BiPAP therapy.  He was titrated up to 27/23 and AHI was still increased at 6.7, with an RDI of 13.4, and oxygen desaturation to 87%.  Optimal titration was not achieved.  During that study, he was in atrial fibrillation.  He was referred for BiPAP set up and this was initiated on 12/09/2016.  I saw him for initial sleep evaluation in April 2018. A download of his ResMed AirCurve 10 VAuto unit  set at a maximum IPAP pressure of 25 and minimum EPAP pressure of 20 demonstrated compliance with 97% of days with usage and 93% of days with usage greater than 4 hours.  Average usage on days used was 7 hours and 33 minutes.  His AHI was excellent at 0.7.  He was having difficulty with a very high pressure.  Further review of his download indicates his 95th percentile EPAP pressures 20.1 and IPAP pressure 24.1.  He had a newly released  Dreamwear full face mask and was tolerating exceptionally the new design well and much better than the other full face mask that he had been using.  When I reviewed his BiPAP settings, he currently has a ramp time of 45 minutes with initiation of therapy at 14/10.  He had noticed significant improvement since initiating CPAP therapy.  His previous nocturia had significantly reduced.  He was unaware of breakthrough snoring.  He had leak with his other full face mask, but the new Dreamwear mask is significantly improved.  He denied any breakthrough snoring, daytime sleepiness, hypersomnolence, bruxism, restless legs, hypnogognic hallucinations, or cataplexy.  There are no parasomnias.  An Epworth Sleepiness Scale score was calculated in the office  as shown below:  Epworth Sleepiness Scale: Situation   Chance of Dozing/Sleeping (0 = never , 1 = slight chance , 2 = moderate chance , 3 = high chance )   sitting and reading 0   watching TV 0   sitting inactive in a public place 0   being a passenger in a motor vehicle for an hour or more 1   lying down in the afternoon 3   sitting and talking to someone 0   sitting quietly after lunch (no alcohol) 0   while stopped for a few minutes in traffic as the driver 0   Total Score  4    Since his sleep evaluation, he states he had used BiPAP continuously until his mask broke and February 2020.  He had lost over 45 pounds since March 2019.  His waist size went from 42 down to 36.  Without a mass, he noticed that he was sleeping better that he had then he had had prior to initiating BiPAP therapy.  He called his DME company which is [employer] about obtaining a new mask and they had requested he needed to see me initially to do this.    He admits to mild snoring.  He denies any witnessed apneic spells.  He denies daytime sleepiness and a new Epworth scale was calculated in the office today which endorsed at 2.  He would like another trial with BiPAP therapy and  is in need for new mask.  He presents for evaluation.  Past Medical History:  Diagnosis Date   At risk for sleep apnea    STOP-BANG= 5       SENT TO PCP 07-12-2015   CAD (coronary artery disease)    a. s/p PCI with DES x 3 to LAD in 2006 with bifurcation lesion and Plavix indefinitely was recommended.   Chronic combined systolic and diastolic CHF (congestive heart failure) ([place])    a. Prior EF normal but EF dropped to 30-35% in 07/2016 in setting of AFIB.   CKD (chronic kidney disease), stage III (HCC)    Dyslipidemia    Dyspnea    tired quick d/t Afib   Dyspnea on exertion    GERD (gastroesophageal reflux disease)    History of GI bleed    History of kidney stones    History of MI (myocardial infarction)    11-05-2005--  periprocedural MI  (cardiac cath) per dr [name] note   Hypertension    Hypertriglyceridemia    Iron deficiency anemia    Myocardial infarction ([place])    Persistent atrial fibrillation    a. 07/2016, started on Eliquis -> readmitted later that month for AF RVR/acute HF - initially unsuccessful DCCV following TEE, so loaded with amio with repeat successful DCCV 07/2816.   Right ureteral stone    S/P drug eluting coronary stent placement    x3  to LAD  2006   Sleep apnea    wears BIPAP, 14 up to 24    Past Surgical History:  Procedure Laterality Date   CARDIOVASCULAR STRESS TEST  01-06-2013  dr [name]   normal perfusion study/  no ischemia or infarct/  normal LV function and wall motion , ef 57%   CARDIOVERSION N/A 08/05/2016   Procedure: CARDIOVERSION;  Surgeon: Sanda Klein, MD;  Location: [facility];  Service: Cardiovascular;  Laterality: N/A;   CARDIOVERSION N/A 08/08/2016   Procedure: CARDIOVERSION;  Surgeon: Jerline Pain, MD;  Location: [facility];  Service: Cardiovascular;  Laterality: N/A;   CORONARY ANGIOPLASTY  11-13-2005 dr [name]   Successful touch up post-dilatation of the 3 tandem overlying Cypher stents in proximal to mid  LAD (based on IVUS 0% stenosis and patent diagonal branch)/  Cutting Balloon Angioplasty of Ramus branch   CORONARY ANGIOPLASTY WITH STENT PLACEMENT  11-05-2005   dr [name]   PCI and DES x3 to birfurcation lesion LAD and diagonal branch of LAD/   20% dLM,  30-40% RCA, ef 60%   CYSTOSCOPY WITH RETROGRADE PYELOGRAM, URETEROSCOPY AND STENT PLACEMENT Right 07/14/2015   Procedure: CYSTOSCOPY WITH RETROGRADE PYELOGRAM, URETEROSCOPY AND STENT PLACEMENT;  Surgeon: Alexis Frock, MD;  Location: [facility];  Service: Urology;  Laterality: Right;   ELECTROPHYSIOLOGIC STUDY N/A 10/24/2016   Procedure: Atrial Fibrillation Ablation;  Surgeon: Will Meredith Leeds, MD;  Location: [facility];  Service: Cardiovascular;  Laterality: N/A;   EXTRACORPOREAL SHOCK WAVE LITHOTRIPSY  yrs ago   HEMORRHOID SURGERY N/A 10/24/2017   Procedure: HEMORRHOIDECTOMY;  Surgeon: Ileana Roup, MD;  Location: [facility];  Service: General;  Laterality: N/A;   IR GENERIC HISTORICAL  10/23/2016   IR FLUORO GUIDE CV LINE RIGHT 10/23/2016 Greggory Keen, MD [facility]   IR GENERIC HISTORICAL  10/23/2016   IR US GUIDE VASC ACCESS RIGHT 10/23/2016 Greggory Keen, MD [facility]   LEFT URETEROSCOPIC STONE EXTRACTION  04-10-2006   STONE EXTRACTION WITH BASKET Right 07/14/2015   Procedure: STONE EXTRACTION WITH BASKET;  Surgeon: Alexis Frock, MD;  Location: [facility];  Service: Urology;  Laterality: Right;   TEE WITHOUT CARDIOVERSION N/A 08/05/2016   Procedure: TRANSESOPHAGEAL ECHOCARDIOGRAM (TEE);  Surgeon: Sanda Klein, MD;  Location: [facility];  Service: Cardiovascular;  Laterality: N/A;   TRANSTHORACIC ECHOCARDIOGRAM  02-04-2012   grade I diastolic dysfunction/  ef 55-60%/  trivial MR and TR/  mild LAE    Current Medications: Outpatient Medications Prior to Visit  Medication Sig Dispense Refill   allopurinol (ZYLOPRIM) 300 MG tablet Take 300 mg by mouth daily.      atorvastatin (LIPITOR) 40 MG tablet TAKE 1 TABLET(40 MG) BY MOUTH EVERY MORNING 90 tablet 2   carvedilol (COREG) 25 MG tablet Take 1 tablet (25 mg total) by mouth 2 (two) times daily with a meal. 180 tablet 2   diphenhydramine-acetaminophen (TYLENOL PM) 25-500 MG TABS tablet Take 1 tablet by mouth at bedtime.     ferrous sulfate 325 (65 FE) MG tablet Take 1 tablet (325 mg total) by mouth every morning. 90 tablet 2   furosemide (LASIX) 40 MG tablet TAKE 1 TABLET(40 MG) BY MOUTH DAILY 90 tablet 2   lisinopril (PRINIVIL,ZESTRIL) 40 MG tablet TAKE 1 TABLET(40 MG) BY MOUTH DAILY 90 tablet 2   nitroGLYCERIN (NITROSTAT) 0.4 MG SL tablet Place 1 tablet (0.4 mg total) under the tongue every 5 (five) minutes as needed for chest pain. X 3 doses 25 tablet prn   pantoprazole (PROTONIX) 40 MG tablet Take 1 tablet (40 mg total) by mouth daily. 90 tablet 2   polyvinyl alcohol (LIQUIFILM TEARS) 1.4 % ophthalmic solution Place 1 drop into the right eye daily.      potassium chloride SA (K-DUR,KLOR-CON) 20 MEQ tablet TAKE 1 TABLET(20 MEQ) BY MOUTH DAILY 90 tablet 2   XARELTO 20 MG TABS tablet TAKE 1 TABLET EVERY DAY 90 tablet 1   allopurinol (ZYLOPRIM) 100 MG tablet Take 1 tablet (100 mg total) by mouth daily. 30 tablet 6   colchicine 0.6 MG tablet Take 1 tablet (0.6 mg total) by mouth 2 (two) times daily. 30 tablet 2   Facility-Administered Medications Prior to Visit  Medication Dose Route Frequency Provider Last Rate Last Dose   triamcinolone acetonide (KENALOG) 10 MG/ML injection 10 mg  10 mg Other Once Regal, Norman S, DPM       triamcinolone acetonide (KENALOG) 10 MG/ML injection 10 mg  10 mg Other Once Wallene Huh, DPM         Allergies:   Patient has no known allergies.   Social History   Socioeconomic History   Marital status: Married    Spouse name: Not on file  Number of children: Not on file   Years of education: Not on file   Highest education level: Not on file    Occupational History   Not on file  Social Needs   Financial resource strain: Not on file   Food insecurity    Worry: Not on file    Inability: Not on file   Transportation needs    Medical: Not on file    Non-medical: Not on file  Tobacco Use   Smoking status: Never Smoker   Smokeless tobacco: Never Used  Substance and Sexual Activity   Alcohol use: No    Comment: 2 beers a year   Drug use: No   Sexual activity: Not on file  Lifestyle   Physical activity    Days per week: Not on file    Minutes per session: Not on file   Stress: Not on file  Relationships   Social connections    Talks on phone: Not on file    Gets together: Not on file    Attends religious service: Not on file    Active member of club or organization: Not on file    Attends meetings of clubs or organizations: Not on file    Relationship status: Not on file  Other Topics Concern   Not on file  Social History Narrative   Not on file     Family History:  The patient's family history includes Cancer in an other family member; Congestive Heart Failure (age of onset: 50) in his father; Heart attack (age of onset: 26) in his father.   ROS General: Negative; No fevers, chills, or night sweats;  HEENT: Negative; No changes in vision or hearing, sinus congestion, difficulty swallowing Pulmonary: Negative; No cough, wheezing, shortness of breath, hemoptysis Cardiovascular: History of atrial fibrillation, status post ablation, CAD, no recent chest pain GI: Positive for GERD GU: Negative; No dysuria, hematuria, or difficulty voiding Musculoskeletal: Negative; no myalgias, joint pain, or weakness Hematologic/Oncology: Negative; no easy bruising, bleeding Endocrine: Negative; no heat/cold intolerance; no diabetes Neuro: Negative; no changes in balance, headaches Skin: Negative; No rashes or skin lesions Psychiatric: Negative; No behavioral problems, depression Sleep: see HPI Other comprehensive  14 point system review is negative.   PHYSICAL EXAM:   VS:  BP 119/82    Pulse 63    Ht _0  (1.727 m)    Wt 193 lb 6.4 oz (87.7 kg)    SpO2 95%    BMI 29.41 kg/m     Repeat blood pressure by me was 120/80  Weight reduction from 239 pounds down to 193 pounds.  Wt Readings from Last 3 Encounters:  06/02/19 193 lb 6.4 oz (87.7 kg)  09/03/18 187 lb (84.8 kg)  03/03/18 220 lb 0.3 oz (99.8 kg)    General: Alert, oriented, no distress.  Skin: normal turgor, no rashes, warm and dry HEENT: Normocephalic, atraumatic. Pupils equal round and reactive to light; sclera anicteric; extraocular muscles intact;  Nose without nasal septal hypertrophy Mouth/Parynx benign; Mallinpatti scale 3/4 Neck: No JVD, no carotid bruits; normal carotid upstroke Lungs: clear to ausculatation and percussion; no wheezing or rales Chest wall: without tenderness to palpitation Heart: PMI not displaced, RRR, s1 s2 normal, 1/6 systolic murmur, no diastolic murmur, no rubs, gallops, thrills, or heaves Abdomen: soft, nontender; no hepatosplenomehaly, BS+; abdominal aorta nontender and not dilated by palpation. Back: no CVA tenderness Pulses 2+ Musculoskeletal: full range of motion, normal strength, no joint deformities Extremities: no clubbing cyanosis  or edema, Homan's sign negative  Neurologic: grossly nonfocal; Cranial nerves grossly wnl Psychologic: Normal mood and affect   Studies/Labs Reviewed:   EKG:  EKG is ordered today.  ECG (independently read by me): NSR at 63; Nondiagnostic T changes  April 2018 ECG (independently read by me): Sinus rhythm at 63 bpm.  Left affix deviation.  Small Q waves in 1 and aVL.  Nondiagnostic T-wave changes V2 and V3.  Recent Labs: BMP Latest Ref Rng & Units 03/03/2018 03/02/2018 02/28/2018  Glucose 65 - 99 mg/dL 87 94 102(H)  BUN 6 - 20 mg/dL 14 20 21(H)  Creatinine 0.61 - 1.24 mg/dL 1.19 1.38(H) 1.21  Sodium 135 - 145 mmol/L 144 144 143  Potassium 3.5 - 5.1 mmol/L 3.7 4.2  4.6  Chloride 101 - 111 mmol/L 111 110 112(H)  CO2 22 - 32 mmol/L 23 22 21(L)  Calcium 8.9 - 10.3 mg/dL 9.1 9.0 10.0     Hepatic Function Latest Ref Rng & Units 02/28/2018 11/13/2017 07/17/2016  Total Protein 6.5 - 8.1 g/dL 7.7 6.7 6.6  Albumin 3.5 - 5.0 g/dL 3.9 3.9 3.7  AST 15 - 41 U/L _0 ALT 17 - 63 U/L 15(L) 20 22  Alk Phosphatase 38 - 126 U/L 81 71 38  Total Bilirubin 0.3 - 1.2 mg/dL 1.2 1.0 0.6    CBC Latest Ref Rng & Units 03/03/2018 03/02/2018 02/28/2018  WBC 4.0 - 10.5 K/uL 6.4 8.0 11.8(H)  Hemoglobin 13.0 - 17.0 g/dL 12.1(L) 12.2(L) 14.5  Hematocrit 39.0 - 52.0 % 38.1(L) 38.2(L) 43.6  Platelets 150 - 400 K/uL 139(L) 150 167   Lab Results  Component Value Date   MCV 96.5 03/03/2018   MCV 98.7 03/02/2018   MCV 96.5 02/28/2018   Lab Results  Component Value Date   TSH 0.871 08/03/2016   No results found for: HGBA1C   BNP    Component Value Date/Time   BNP 414.8 (H) 08/03/2016 1103    ProBNP    Component Value Date/Time   PROBNP 144.7 (H) 02/03/2012 1854     Lipid Panel     Component Value Date/Time   CHOL 140 01/01/2013 1037   TRIG 230.0 (H) 01/01/2013 1037   HDL 38.20 (L) 01/01/2013 1037   CHOLHDL 4 01/01/2013 1037   VLDL 46.0 (H) 01/01/2013 1037   LDLDIRECT 68.6 01/01/2013 1037     RADIOLOGY: No results found.   Additional studies/ records that were reviewed today include:  I reviewed the patient's office records, atrial fibrillation clinic, split-night study, [employer] information, and download.    ASSESSMENT:    1. OSA (obstructive sleep apnea)   2. Paroxysmal atrial fibrillation (HCC)   3. Essential hypertension   4. CAD in native artery      PLAN:  Peter [name] is a 73 year old male who has a history  CAD, status post PCI to his LAD, and atrial fibrillation for which he underwent successful ablation in December 2018.  Prior to his ablation procedure, he had undergone a sleep study, which verified very severe sleep apnea  with an AHI of 84.2 and markedly reduced oxygen desaturation to 63% with time spent below 88% saturation at 53.9 minutes.  When I saw him in April 2018 following institution of BiPAP therapy, he was feeling significantly improved and was meeting Medicare compliance standards.  He had excellent response to BiPAP with an AHI of 0.7.  Since March 2019, he has lost approximately 45 pounds in 6 inches from  his waist with waist size being reduced from 42 down to 36.  His Respironics DreamWear mask broke earlier this year and as result he has not been using BiPAP therapy.  However, he admits that he is sleeping better now than when he had slept prior to BiPAP institution but he would like to reinstitute therapy.  He had noticed significant improvement with a DreamWear mask design.  I discussed with him that a new ResMed air fit F 30i mask has subsequently come out with similar design but may actually have more stability under the nose.  I am writing a prescription for him to receive this mask which will be helpful to compare to his previous DreamWear full face mask style.  Since he has lost over 45 pounds, I will initiate therapy at lower pressure and will reduce EPAP minimum from 20 down to 12 with IPAP maximum up to 25.  I will decrease his ramp time from 45 minutes down to 25 minutes with ramp start an EPAP of 10.  He states he will use this every night and in the next several weeks we will obtain a new download and at that time adjustments will be made to his pressure settings if necessary.  His blood pressure today is stable on carvedilol 25 mg twice a day furosemide 40 mg daily and lisinopril 40 mg.  He is maintaining sinus rhythm and is on anticoagulation with Xarelto without bleeding.  He continues to be on atorvastatin for hyperlipidemia.   Medication Adjustments/Labs and Tests Ordered: Current medicines are reviewed at length with the patient today.  Concerns regarding medicines are outlined above.   Medication changes, Labs and Tests ordered today are listed in the Patient Instructions below.  Patient Instructions  Medication Instructions:  The current medical regimen is effective;  continue present plan and medications.  If you need a refill on your cardiac medications before your next appointment, please call your pharmacy.   Follow-Up: At [facility], you and your health needs are our priority.  As part of our continuing mission to provide you with exceptional heart care, we have created designated Provider Care Teams.  These Care Teams include your primary Cardiologist (physician) and Advanced Practice Providers (APPs -  Physician Assistants and Nurse Practitioners) who all work together to provide you with the care you need, when you need it. You will need a follow up appointment in 6 months.  Please call our office 2 months in advance to schedule this appointment.  You may see Dr.Juwann Sherk (sleep) or one of the following Advanced Practice Providers on your designated Care Team: Almyra Deforest, [place]  Fabian Sharp, PA-C  Any Other Special Instructions Will Be Listed Below (If Applicable). Changes pressure, sent new to machine- Mask with be ordered through [employer] We will check new download in 3-4 weeks.      Signed, [name], MD  06/04/2019 4:39 PM    [place] [address], [place], [place], [place]  [address] Phone: [phone]

## 2019-06-02 NOTE — Patient Instructions (Signed)
Medication Instructions:  The current medical regimen is effective;  continue present plan and medications.  If you need a refill on your cardiac medications before your next appointment, please call your pharmacy.   Follow-Up: At [facility], you and your health needs are our priority.  As part of our continuing mission to provide you with exceptional heart care, we have created designated Provider Care Teams.  These Care Teams include your primary Cardiologist (physician) and Advanced Practice Providers (APPs -  Physician Assistants and Nurse Practitioners) who all work together to provide you with the care you need, when you need it. You will need a follow up appointment in 6 months.  Please call our office 2 months in advance to schedule this appointment.  You may see Dr.Kelly (sleep) or one of the following Advanced Practice Providers on your designated Care Team: Almyra Deforest, [place] . Fabian Sharp, PA-C  Any Other Special Instructions Will Be Listed Below (If Applicable). Changes pressure, sent new to machine- Mask with be ordered through [employer] We will check new download in 3-4 weeks.

## 2019-06-04 ENCOUNTER — Encounter: Payer: Self-pay | Admitting: Cardiovascular Disease

## 2019-06-04 ENCOUNTER — Telehealth: Payer: Self-pay | Admitting: *Deleted

## 2019-06-04 ENCOUNTER — Other Ambulatory Visit: Payer: Self-pay | Admitting: Cardiovascular Disease

## 2019-06-04 DIAGNOSIS — G4733 Obstructive sleep apnea (adult) (pediatric): Secondary | ICD-10-CM

## 2019-06-04 NOTE — Addendum Note (Signed)
Addended by: [name] A on: 06/04/2019 04:41 PM   Modules accepted: Level of Service

## 2019-06-04 NOTE — Telephone Encounter (Signed)
Order for new mask submitted via Epic to [facility].

## 2019-06-04 NOTE — Telephone Encounter (Signed)
-----   Message from Caprice Beaver, LPN sent at 9/47/1252 11:37 AM EDT ----- Patient seen Dr.Kelly- he needs a new mask ordered: ResMed airfit f30i.  DME: [employer]

## 2019-06-08 DIAGNOSIS — D485 Neoplasm of uncertain behavior of skin: Secondary | ICD-10-CM | POA: Diagnosis not present

## 2019-06-08 DIAGNOSIS — C44729 Squamous cell carcinoma of skin of left lower limb, including hip: Secondary | ICD-10-CM | POA: Diagnosis not present

## 2019-06-08 DIAGNOSIS — Z85828 Personal history of other malignant neoplasm of skin: Secondary | ICD-10-CM | POA: Diagnosis not present

## 2019-06-10 DIAGNOSIS — G4733 Obstructive sleep apnea (adult) (pediatric): Secondary | ICD-10-CM | POA: Diagnosis not present

## 2019-07-09 DIAGNOSIS — M109 Gout, unspecified: Secondary | ICD-10-CM | POA: Diagnosis not present

## 2019-08-06 ENCOUNTER — Other Ambulatory Visit: Payer: Self-pay | Admitting: Cardiology

## 2019-08-20 DIAGNOSIS — M109 Gout, unspecified: Secondary | ICD-10-CM | POA: Diagnosis not present

## 2019-10-12 ENCOUNTER — Encounter: Payer: Self-pay | Admitting: Cardiology

## 2019-10-12 ENCOUNTER — Other Ambulatory Visit: Payer: Self-pay

## 2019-10-12 ENCOUNTER — Ambulatory Visit: Payer: Medicare HMO | Admitting: Cardiology

## 2019-10-12 VITALS — BP 116/80 | HR 69 | Ht 68.0 in | Wt 207.6 lb

## 2019-10-12 DIAGNOSIS — I480 Paroxysmal atrial fibrillation: Secondary | ICD-10-CM

## 2019-10-12 NOTE — Progress Notes (Signed)
Electrophysiology Office Note   Date:  10/12/2019   ID:  Peter [name], DOB 1946/06/16, MRN [id number]  PCP:  [name], MD  Cardiologist:  Marlou Porch Primary Electrophysiologist:  Constance Haw, MD    No chief complaint on file.    History of Present Illness: Peter [name] is a 73 y.o. male who presents today for electrophysiology evaluation.   History of CAD s/p PCI to LADx3 in 2006, previously chronic diastolic CHF, dyslipidemia, GERD, h/o GIB, HTN, hypertriglyceridemia, iron deficiency anemia, obesity, CKD stage III, recently diagnosed afib who was admitted with persistent afib (CHADSVASC 4). AF ablation 10/24/16.   Today, denies symptoms of palpitations, chest pain, shortness of breath, orthopnea, PND, lower extremity edema, claudication, dizziness, presyncope, syncope, bleeding, or neurologic sequela. The patient is tolerating medications without difficulties.  Overall he is doing well.  No chest pain or shortness of breath.  Able to do all of his daily activities.   Past Medical History:  Diagnosis Date  . At risk for sleep apnea    STOP-BANG= 5       SENT TO PCP 07-12-2015  . CAD (coronary artery disease)    a. s/p PCI with DES x 3 to LAD in 2006 with bifurcation lesion and Plavix indefinitely was recommended.  . Chronic combined systolic and diastolic CHF (congestive heart failure) ([place])    a. Prior EF normal but EF dropped to 30-35% in 07/2016 in setting of AFIB.  . CKD (chronic kidney disease), stage III   . Dyslipidemia   . Dyspnea    tired quick d/t Afib  . Dyspnea on exertion   . GERD (gastroesophageal reflux disease)   . History of GI bleed   . History of kidney stones   . History of MI (myocardial infarction)    11-05-2005--  periprocedural MI  (cardiac cath) per dr [name] note  . Hypertension   . Hypertriglyceridemia   . Iron deficiency anemia   . Myocardial infarction ([place])   . Persistent atrial fibrillation ([place])    a. 07/2016, started on  Eliquis -> readmitted later that month for AF RVR/acute HF - initially unsuccessful DCCV following TEE, so loaded with amio with repeat successful DCCV 07/2816.  . Right ureteral stone   . S/P drug eluting coronary stent placement    x3  to LAD  2006  . Sleep apnea    wears BIPAP, 14 up to 24   Past Surgical History:  Procedure Laterality Date  . CARDIOVASCULAR STRESS TEST  01-06-2013  dr [name]   normal perfusion study/  no ischemia or infarct/  normal LV function and wall motion , ef 57%  . CARDIOVERSION N/A 08/05/2016   Procedure: CARDIOVERSION;  Surgeon: Sanda Klein, MD;  Location: [place];  Service: Cardiovascular;  Laterality: N/A;  . CARDIOVERSION N/A 08/08/2016   Procedure: CARDIOVERSION;  Surgeon: Jerline Pain, MD;  Location: [facility] ENDOSCOPY;  Service: Cardiovascular;  Laterality: N/A;  . CORONARY ANGIOPLASTY  11-13-2005 dr [name]   Successful touch up post-dilatation of the 3 tandem overlying Cypher stents in proximal to mid LAD (based on IVUS 0% stenosis and patent diagonal branch)/  Cutting Balloon Angioplasty of Ramus branch  . CORONARY ANGIOPLASTY WITH STENT PLACEMENT  11-05-2005   dr [name]   PCI and DES x3 to birfurcation lesion LAD and diagonal branch of LAD/   20% dLM,  30-40% RCA, ef 60%  . CYSTOSCOPY WITH RETROGRADE PYELOGRAM, URETEROSCOPY AND STENT PLACEMENT Right 07/14/2015   Procedure:  CYSTOSCOPY WITH RETROGRADE PYELOGRAM, URETEROSCOPY AND STENT PLACEMENT;  Surgeon: Alexis Frock, MD;  Location: [facility];  Service: Urology;  Laterality: Right;  . ELECTROPHYSIOLOGIC STUDY N/A 10/24/2016   Procedure: Atrial Fibrillation Ablation;  Surgeon: Kristi Hyer Meredith Leeds, MD;  Location: [place] CV LAB;  Service: Cardiovascular;  Laterality: N/A;  . EXTRACORPOREAL SHOCK WAVE LITHOTRIPSY  yrs ago  . HEMORRHOID SURGERY N/A 10/24/2017   Procedure: HEMORRHOIDECTOMY;  Surgeon: Ileana Roup, MD;  Location: [place];  Service: General;  Laterality: N/A;  .  IR GENERIC HISTORICAL  10/23/2016   IR FLUORO GUIDE CV LINE RIGHT 10/23/2016 Greggory Keen, MD [facility]  . IR GENERIC HISTORICAL  10/23/2016   IR US GUIDE VASC ACCESS RIGHT 10/23/2016 Greggory Keen, MD [facility]  . LEFT URETEROSCOPIC STONE EXTRACTION  04-10-2006  . STONE EXTRACTION WITH BASKET Right 07/14/2015   Procedure: STONE EXTRACTION WITH BASKET;  Surgeon: Alexis Frock, MD;  Location: [facility];  Service: Urology;  Laterality: Right;  . TEE WITHOUT CARDIOVERSION N/A 08/05/2016   Procedure: TRANSESOPHAGEAL ECHOCARDIOGRAM (TEE);  Surgeon: Sanda Klein, MD;  Location: [facility] ENDOSCOPY;  Service: Cardiovascular;  Laterality: N/A;  . TRANSTHORACIC ECHOCARDIOGRAM  02-04-2012   grade I diastolic dysfunction/  ef 55-60%/  trivial MR and TR/  mild LAE     Current Outpatient Medications  Medication Sig Dispense Refill  . allopurinol (ZYLOPRIM) 100 MG tablet Take 1 tablet by mouth at bedtime.    Marland Kitchen allopurinol (ZYLOPRIM) 300 MG tablet Take 300 mg by mouth daily.    Marland Kitchen atorvastatin (LIPITOR) 40 MG tablet TAKE 1 TABLET EVERY MORNING 90 tablet 0  . carvedilol (COREG) 25 MG tablet TAKE 1 TABLET TWICE DAILY WITH MEALS 180 tablet 0  . diphenhydramine-acetaminophen (TYLENOL PM) 25-500 MG TABS tablet Take 1 tablet by mouth at bedtime.    . ferrous sulfate 325 (65 FE) MG tablet Take 1 tablet (325 mg total) by mouth every morning. 90 tablet 2  . furosemide (LASIX) 40 MG tablet TAKE 1 TABLET EVERY DAY 90 tablet 0  . lisinopril (ZESTRIL) 40 MG tablet TAKE 1 TABLET EVERY DAY 90 tablet 0  . nitroGLYCERIN (NITROSTAT) 0.4 MG SL tablet Place 1 tablet (0.4 mg total) under the tongue every 5 (five) minutes as needed for chest pain. X 3 doses 25 tablet prn  . pantoprazole (PROTONIX) 40 MG tablet TAKE 1 TABLET EVERY DAY 90 tablet 0  . polyvinyl alcohol (LIQUIFILM TEARS) 1.4 % ophthalmic solution Place 1 drop into the right eye daily.     . potassium chloride SA (K-DUR) 20 MEQ tablet TAKE 1  TABLET EVERY DAY 90 tablet 0  . XARELTO 20 MG TABS tablet TAKE 1 TABLET EVERY DAY 90 tablet 1   Current Facility-Administered Medications  Medication Dose Route Frequency Provider Last Rate Last Dose  . triamcinolone acetonide (KENALOG) 10 MG/ML injection 10 mg  10 mg Other Once Regal, Norman S, DPM      . triamcinolone acetonide (KENALOG) 10 MG/ML injection 10 mg  10 mg Other Once Wallene Huh, DPM        Allergies:   Patient has no known allergies.   Social History:  The patient  reports that he has never smoked. He has never used smokeless tobacco. He reports that he does not drink alcohol or use drugs.   Family History:  The patient's family history includes Cancer in an other family member; Congestive Heart Failure (age of onset: 51) in his father; Heart  attack (age of onset: 20) in his father.    ROS:  Please see the history of present illness.   Otherwise, review of systems is positive for none.   All other systems are reviewed and negative.   PHYSICAL EXAM: VS:  BP 116/80   Pulse 69   Ht 5\' 8"  (1.727 m)   Wt 207 lb 9.6 oz (94.2 kg)   SpO2 97%   BMI 31.57 kg/m  , BMI Body mass index is 31.57 kg/m. GEN: Well nourished, well developed, in no acute distress  HEENT: normal  Neck: no JVD, carotid bruits, or masses Cardiac: RRR; no murmurs, rubs, or gallops,no edema  Respiratory:  clear to auscultation bilaterally, normal work of breathing GI: soft, nontender, nondistended, + BS MS: no deformity or atrophy  Skin: warm and dry Neuro:  Strength and sensation are intact Psych: euthymic mood, full affect  EKG:  EKG is ordered today. Personal review of the ekg ordered shows sinus rhythm, rate 69  Recent Labs: No results found for requested labs within last 8760 hours.    Lipid Panel     Component Value Date/Time   CHOL 140 01/01/2013 1037   TRIG 230.0 (H) 01/01/2013 1037   HDL 38.20 (L) 01/01/2013 1037   CHOLHDL 4 01/01/2013 1037   VLDL 46.0 (H) 01/01/2013 1037    LDLDIRECT 68.6 01/01/2013 1037     Wt Readings from Last 3 Encounters:  10/12/19 207 lb 9.6 oz (94.2 kg)  06/02/19 193 lb 6.4 oz (87.7 kg)  09/03/18 187 lb (84.8 kg)      Other studies Reviewed: Additional studies/ records that were reviewed today include: TEE 11/22/16 Review of the above records today demonstrates:  - Left ventricle: The cavity size was normal. Wall thickness was   increased in a pattern of mild LVH. Systolic function was normal.   The estimated ejection fraction was in the range of 55% to 60%.   Left ventricular diastolic function parameters were normal. - Mitral valve: Calcified annulus. Mildly thickened leaflets . - Left atrium: The atrium was mildly dilated. - Atrial septum: No defect or patent foramen ovale was identified.   ASSESSMENT AND PLAN:  1.  Persistent atrial fibrillation/flutter: Status post ablation.  Currently on Coreg and Xarelto.  Remains in sinus rhythm  This patients CHA2DS2-VASc Score and unadjusted Ischemic Stroke Rate (% per year) is equal to 4.8 % stroke rate/year from a score of 4  Above score calculated as 1 point each if present [CHF, HTN, DM, Vascular=MI/PAD/Aortic Plaque, Age if 65-74, or Male] Above score calculated as 2 points each if present [Age > 75, or Stroke/TIA/TE]   2. Likely nonischemic cardiomyopathy: Ejection fraction has normalized.  No changes.  3. HTN: Controlled  4. CAD s/p PCI to LAD in 2006: Currently asymptomatic.  No changes.  5. OSA: BiPAP compliance encouraged  Current medicines are reviewed at length with the patient today.   The patient does not have concerns regarding his medicines.  The following changes were made today: None none  Labs/ tests ordered today include:  Orders Placed This Encounter  Procedures  . EKG 12-Lead     Disposition:   FU with Ronalee Scheunemann 12 months  Signed, Sequita Wise Meredith Leeds, MD  10/12/2019 9:50 AM     [facility] [address] [place] [address] [phone] (office) [phone] (fax)

## 2019-10-12 NOTE — Patient Instructions (Signed)
Medication Instructions:  Your physician recommends that you continue on your current medications as directed. Please refer to the Current Medication list given to you today.  * If you need a refill on your cardiac medications before your next appointment, please call your pharmacy.   Labwork: None ordered  Testing/Procedures: None ordered  Follow-Up: At [facility], you and your health needs are our priority.  As part of our continuing mission to provide you with exceptional heart care, we have created designated Provider Care Teams.  These Care Teams include your primary Cardiologist (physician) and Advanced Practice Providers (APPs -  Physician Assistants and Nurse Practitioners) who all work together to provide you with the care you need, when you need it.  You will need a follow up appointment in 1 year.  Please call our office 2 months in advance to schedule this appointment.  You may see Dr [name] or one of the following Advanced Practice Providers on your designated Care Team:    Chanetta Marshall, NP  Tommye Standard, PA-C  Oda Kilts, [place]  *Please note that any paperwork needing to be filled out by the provider will need to be addressed at the front desk prior to seeing the provider. Please note that any FMLA, disability or other documents regarding health condition is subject to a $25.00 charge that must be received prior to completion of paperwork in the form of a money order or check.  Thank you for choosing [facility]!!   Trinidad Curet, RN [phone]  Any Other Special Instructions Will Be Listed Below (If Applicable).

## 2019-11-16 ENCOUNTER — Other Ambulatory Visit: Payer: Self-pay | Admitting: Cardiology

## 2019-11-17 ENCOUNTER — Other Ambulatory Visit: Payer: Self-pay

## 2019-11-17 DIAGNOSIS — Z20822 Contact with and (suspected) exposure to covid-19: Secondary | ICD-10-CM

## 2019-11-18 LAB — NOVEL CORONAVIRUS, NAA: SARS-CoV-2, NAA: DETECTED — AB

## 2019-11-19 ENCOUNTER — Telehealth: Payer: Self-pay | Admitting: Critical Care Medicine

## 2019-11-19 ENCOUNTER — Other Ambulatory Visit: Payer: Self-pay | Admitting: Critical Care Medicine

## 2019-11-19 DIAGNOSIS — I4891 Unspecified atrial fibrillation: Secondary | ICD-10-CM

## 2019-11-19 DIAGNOSIS — I2583 Coronary atherosclerosis due to lipid rich plaque: Secondary | ICD-10-CM

## 2019-11-19 DIAGNOSIS — I2510 Atherosclerotic heart disease of native coronary artery without angina pectoris: Secondary | ICD-10-CM

## 2019-11-19 DIAGNOSIS — N1831 Chronic kidney disease, stage 3a: Secondary | ICD-10-CM

## 2019-11-19 DIAGNOSIS — U071 COVID-19: Secondary | ICD-10-CM

## 2019-11-19 DIAGNOSIS — I10 Essential (primary) hypertension: Secondary | ICD-10-CM

## 2019-11-19 NOTE — Telephone Encounter (Signed)
I attempted to call this patient who is Covid positive from January 6.  He is extremely high risk due to multiple comorbidities.  He would be a candidate for monoclonal antibody.  I will attempt to reach him again in a left a message for him to return my call

## 2019-11-19 NOTE — Progress Notes (Signed)
  I connected by phone with Peter [name] on 11/19/2019 at 10:37 AM to discuss the potential use of an new treatment for mild to moderate COVID-19 viral infection in non-hospitalized patients.  This patient is a 74 y.o. male that meets the FDA criteria for Emergency Use Authorization of bamlanivimab or casirivimab\imdevimab.  Has a (+) direct SARS-CoV-2 viral test result  Has mild or moderate COVID-19   Is ? 75 years of age and weighs ? 40 kg  Is NOT hospitalized due to COVID-19  Is NOT requiring oxygen therapy or requiring an increase in baseline oxygen flow rate due to COVID-19  Is within 10 days of symptom onset  Has at least one of the high risk factor(s) for progression to severe COVID-19 and/or hospitalization as defined in EUA.  Specific high risk criteria : Chronic Kidney Disease (CKD) age > 4, HTN, cardiovascular   I have spoken and communicated the following to the patient or parent/caregiver:  1. FDA has authorized the emergency use of bamlanivimab and casirivimab\imdevimab for the treatment of mild to moderate COVID-19 in adults and pediatric patients with positive results of direct SARS-CoV-2 viral testing who are 59 years of age and older weighing at least 40 kg, and who are at high risk for progressing to severe COVID-19 and/or hospitalization.  2. The significant known and potential risks and benefits of bamlanivimab and casirivimab\imdevimab, and the extent to which such potential risks and benefits are unknown.  3. Information on available alternative treatments and the risks and benefits of those alternatives, including clinical trials.  4. Patients treated with bamlanivimab and casirivimab\imdevimab should continue to self-isolate and use infection control measures (e.g., wear mask, isolate, social distance, avoid sharing personal items, clean and disinfect "high touch" surfaces, and frequent handwashing) according to CDC guidelines.   5. The patient or  parent/caregiver has the option to accept or refuse bamlanivimab or casirivimab\imdevimab .  After reviewing this information with the patient, The patient agreed to proceed with receiving the bamlanimivab infusion and will be provided a copy of the Fact sheet prior to receiving the infusion.Peter [name] 11/19/2019 10:37 AM

## 2019-11-19 NOTE — Telephone Encounter (Signed)
I connected with this patient who is Covid positive from January 6.  He is having symptoms of cough fever muscle aches and chills nasal congestion he is a very high risk patient with multiple comorbidities including his age and cardiovascular disease  Called to discuss with patient about Covid symptoms and the use of bamlanivimab, a monoclonal antibody infusion for those with mild to moderate Covid symptoms and at a high risk of hospitalization.  Pt is qualified for this infusion at the [facility] infusion center due to Age > 65 cardiovascular

## 2019-11-22 ENCOUNTER — Ambulatory Visit (HOSPITAL_COMMUNITY)
Admission: RE | Admit: 2019-11-22 | Discharge: 2019-11-22 | Disposition: A | Discharge status: Home / Self Care | Payer: Medicare Other | Source: Ambulatory Visit | Attending: Pulmonary Disease | Admitting: Pulmonary Disease

## 2019-11-22 DIAGNOSIS — N1831 Chronic kidney disease, stage 3a: Secondary | ICD-10-CM | POA: Insufficient documentation

## 2019-11-22 DIAGNOSIS — I2510 Atherosclerotic heart disease of native coronary artery without angina pectoris: Secondary | ICD-10-CM | POA: Insufficient documentation

## 2019-11-22 DIAGNOSIS — U071 COVID-19: Secondary | ICD-10-CM | POA: Diagnosis present

## 2019-11-22 DIAGNOSIS — I4891 Unspecified atrial fibrillation: Secondary | ICD-10-CM | POA: Diagnosis present

## 2019-11-22 DIAGNOSIS — Z23 Encounter for immunization: Secondary | ICD-10-CM | POA: Insufficient documentation

## 2019-11-22 DIAGNOSIS — I10 Essential (primary) hypertension: Secondary | ICD-10-CM | POA: Diagnosis present

## 2019-11-22 DIAGNOSIS — I2583 Coronary atherosclerosis due to lipid rich plaque: Secondary | ICD-10-CM | POA: Insufficient documentation

## 2019-11-22 MED ORDER — ALBUTEROL SULFATE HFA 108 (90 BASE) MCG/ACT IN AERS: 2.0000 | INHALATION_SPRAY | Freq: Once | RESPIRATORY_TRACT | Status: DC | PRN

## 2019-11-22 MED ORDER — SODIUM CHLORIDE 0.9 % IV SOLN: INTRAVENOUS | Status: DC | PRN

## 2019-11-22 MED ORDER — METHYLPREDNISOLONE SODIUM SUCC 125 MG IJ SOLR: 125.0000 mg | Freq: Once | INTRAMUSCULAR | Status: DC | PRN

## 2019-11-22 MED ORDER — FAMOTIDINE IN NACL 20-0.9 MG/50ML-% IV SOLN: 20.0000 mg | Freq: Once | INTRAVENOUS | Status: DC | PRN

## 2019-11-22 MED ORDER — EPINEPHRINE 0.3 MG/0.3ML IJ SOAJ: 0.3000 mg | Freq: Once | INTRAMUSCULAR | Status: DC | PRN

## 2019-11-22 MED ORDER — SODIUM CHLORIDE 0.9 % IV SOLN
700.0000 mg | Freq: Once | INTRAVENOUS | Status: AC
  Administered 2019-11-22: 13:00:00 700 mg via INTRAVENOUS
  Filled 2019-11-22: qty 20

## 2019-11-22 MED ORDER — DIPHENHYDRAMINE HCL 50 MG/ML IJ SOLN: 50.0000 mg | Freq: Once | INTRAMUSCULAR | Status: DC | PRN

## 2019-11-22 NOTE — Progress Notes (Signed)
  Diagnosis: COVID-19  Physician: Dr. Wright  Procedure: Covid Infusion Clinic Med: bamlanivimab infusion - Provided patient with bamlanimivab fact sheet for patients, parents and caregivers prior to infusion.  Complications: No immediate complications noted.  Discharge: Discharged home   Peter [name] 11/22/2019   

## 2019-11-22 NOTE — Discharge Instructions (Signed)

## 2019-11-26 ENCOUNTER — Other Ambulatory Visit: Payer: Self-pay | Admitting: Cardiology

## 2019-11-26 ENCOUNTER — Other Ambulatory Visit: Payer: Self-pay

## 2019-11-26 DIAGNOSIS — I480 Paroxysmal atrial fibrillation: Secondary | ICD-10-CM

## 2019-11-26 MED ORDER — LISINOPRIL 40 MG PO TABS: 40.0000 mg | ORAL_TABLET | Freq: Every day | ORAL | 3 refills | Status: DC

## 2019-11-26 MED ORDER — XARELTO 20 MG PO TABS: 20.0000 mg | ORAL_TABLET | Freq: Every day | ORAL | 0 refills | Status: DC

## 2019-11-26 NOTE — Telephone Encounter (Signed)
New Message  Pt c/o medication issue:  1. Name of Medication: XARELTO & LISINOPRIL  2. How are you currently taking this medication (dosage and times per day)? As written  3. Are you having a reaction (difficulty breathing--STAT)? No  4. What is your medication issue? [employer] did not send in medication on time. Will be out of medication tonight. Says that medication will be in on Thursday or Friday of next week. Needs 10 XARELTO 20 MG TABS tablet and 10 lisinopril (ZESTRIL) 40 MG tablet. Send medication to [facility] #[id number] - [place], [place]

## 2019-11-26 NOTE — Telephone Encounter (Signed)
Pt pharmacy called. He is waiting at [facility] now.

## 2019-11-26 NOTE — Telephone Encounter (Signed)
Pt last saw Dr [name] 10/12/19, last labs 08/20/19 Creat 1.14 at [facility] per [facility], age 74, weight 94.2kg, CrCl 75.75, based on CrCl pt is on appropriate dosage of Xarelto 20mg  QD.  Will refill rx x 10 day supply as pt requested awaiting mail order rx. Will forward message back to refills to fill Lisinopril rx.

## 2019-11-29 NOTE — Telephone Encounter (Signed)
lmomed that pt needed labs for refill

## 2019-12-30 ENCOUNTER — Ambulatory Visit: Payer: Medicare HMO | Admitting: Cardiovascular Disease

## 2020-01-05 ENCOUNTER — Other Ambulatory Visit: Payer: Self-pay | Admitting: Cardiology

## 2020-01-05 DIAGNOSIS — I480 Paroxysmal atrial fibrillation: Secondary | ICD-10-CM

## 2020-01-06 NOTE — Telephone Encounter (Signed)
Prescription refill request for Xarelto received.   Last office visit: 10/12/2019, [place] Weight: 94.2 kg Age: 74 y.o. Scr: 1.14, 08/20/2019, Via [facility] CrCl: 76 ml/min   Prescription refill sent.

## 2020-02-21 ENCOUNTER — Ambulatory Visit: Payer: Medicare HMO | Attending: Internal Medicine

## 2020-02-21 DIAGNOSIS — Z23 Encounter for immunization: Secondary | ICD-10-CM

## 2020-02-21 NOTE — Progress Notes (Signed)
   Covid-19 Vaccination Clinic  Name:  Peter [name]    MRN: [id number] DOB: 1946-05-30  02/21/2020  [name] was observed post Covid-19 immunization for 15 minutes without incident. He was provided with Vaccine Information Sheet and instruction to access the V-Safe system.   [name] was instructed to call 911 with any severe reactions post vaccine: Marland Kitchen Difficulty breathing  . Swelling of face and throat  . A fast heartbeat  . A bad rash all over body  . Dizziness and weakness   Immunizations Administered    Name Date Dose VIS Date Route   Pfizer COVID-19 Vaccine 02/21/2020 12:52 PM 0.3 mL 10/22/2019 Intramuscular   Manufacturer: [place]   Lot: [id number]   [place]: [id number]

## 2020-03-03 ENCOUNTER — Telehealth: Payer: Self-pay | Admitting: Cardiology

## 2020-03-13 ENCOUNTER — Ambulatory Visit: Payer: Medicare HMO | Attending: Internal Medicine

## 2020-03-13 DIAGNOSIS — Z23 Encounter for immunization: Secondary | ICD-10-CM

## 2020-03-13 NOTE — Progress Notes (Signed)
   Covid-19 Vaccination Clinic  Name:  Peter [name]    MRN: [id number] DOB: February 12, 1946  03/13/2020  Peter [name] was observed post Covid-19 immunization for 15 minutes without incident. He was provided with Vaccine Information Sheet and instruction to access the V-Safe system.   Peter [name] was instructed to call 911 with any severe reactions post vaccine: Marland Kitchen Difficulty breathing  . Swelling of face and throat  . A fast heartbeat  . A bad rash all over body  . Dizziness and weakness   Immunizations Administered    Name Date Dose VIS Date Route   Pfizer COVID-19 Vaccine 03/13/2020  2:22 PM 0.3 mL 01/05/2019 Intramuscular   Manufacturer: [place]   Lot: [id number]   [place]: [id number]      Covid-19 Vaccination Clinic  Name:  Peter [name]    MRN: [id number] DOB: October 03, 1946  03/13/2020  Peter [name] was observed post Covid-19 immunization for 15 minutes without incident. He was provided with Vaccine Information Sheet and instruction to access the V-Safe system.   Peter [name] was instructed to call 911 with any severe reactions post vaccine: Marland Kitchen Difficulty breathing  . Swelling of face and throat  . A fast heartbeat  . A bad rash all over body  . Dizziness and weakness   Immunizations Administered    Name Date Dose VIS Date Route   Pfizer COVID-19 Vaccine 03/13/2020  2:22 PM 0.3 mL 01/05/2019 Intramuscular   Manufacturer: [place]   Lot: [id number]   [place]: [id number]

## 2020-03-29 ENCOUNTER — Encounter: Payer: Self-pay | Admitting: Cardiovascular Disease

## 2020-03-29 ENCOUNTER — Telehealth (INDEPENDENT_AMBULATORY_CARE_PROVIDER_SITE_OTHER): Payer: Medicare HMO | Admitting: Cardiovascular Disease

## 2020-03-29 VITALS — Ht 68.0 in | Wt 204.0 lb

## 2020-03-29 DIAGNOSIS — I480 Paroxysmal atrial fibrillation: Secondary | ICD-10-CM | POA: Diagnosis not present

## 2020-03-29 DIAGNOSIS — G4733 Obstructive sleep apnea (adult) (pediatric): Secondary | ICD-10-CM | POA: Diagnosis not present

## 2020-03-29 DIAGNOSIS — I10 Essential (primary) hypertension: Secondary | ICD-10-CM

## 2020-03-29 DIAGNOSIS — I2510 Atherosclerotic heart disease of native coronary artery without angina pectoris: Secondary | ICD-10-CM

## 2020-03-29 NOTE — Progress Notes (Signed)
Virtual Visit via Telephone Note   This visit type was conducted due to national recommendations for restrictions regarding the COVID-19 Pandemic (e.g. social distancing) in an effort to limit this patient's exposure and mitigate transmission in our community.  Due to his co-morbid illnesses, this patient is at least at moderate risk for complications without adequate follow up.  This format is felt to be most appropriate for this patient at this time.  The patient did not have access to video technology/had technical difficulties with video requiring transitioning to audio format only (telephone).  All issues noted in this document were discussed and addressed.  No physical exam could be performed with this format.  Please refer to the patient's chart for his  consent to telehealth for [facility]   The patient was identified using 2 identifiers.  Date:  03/29/2020   ID:  Peter [name], DOB 01/20/1946, MRN [id number]  Patient Location: Home Provider Location: Office  PCP:  Peter Amel, MD  Cardiologist: Dr. Ezra [name], Corky Downs (sleep) Electrophysiologist:  Peter Meredith Leeds, MD   Evaluation Performed:  Follow-Up Visit  Chief Complaint:  F/U  History of Present Illness:     Peter [name] is a 73 y.o. male who is followed by Dr. Candee [name] for cardiology care. [name] has a history of CAD and is status post PCI to his LAD in 2006, and is status post atrial fibrillation ablation in December 2017.  He has a history of chronic systolic heart failure, remote GI bleed, GERD, iron deficiency anemia, and hyperlipidemia.    Due to his significant atrial fibrillation history, he was referred for a sleep study which was done as a split-night protocol on 10/09/2016.  This revealed severe sleep apnea with an AHI of 84.2 per hour.  He was unable to achieve rems sleep on the diagnostic portion of the study.  He at significant oxygen desaturation to 63% with time below 88% at 53.9  minutes.  CPAP was implemented and he was titrated up to 19 cm water pressure; however, due to continued events he was switched to BiPAP therapy.  He was titrated up to 27/23 and AHI was still increased at 6.7, with an RDI of 13.4, and oxygen desaturation to 87%.  Optimal titration was not achieved.  During that study, he was in atrial fibrillation.  He was referred for BiPAP set up and this was initiated on 12/09/2016.  I saw him for initial sleep evaluation in April 2018. A download of his ResMed AirCurve 10 VAuto unit  set at a maximum IPAP pressure of 25 and minimum EPAP pressure of 20 demonstrated compliance with 97% of days with usage and 93% of days with usage greater than 4 hours.  Average usage on days used was 7 hours and 33 minutes.  His AHI was excellent at 0.7.  He was having difficulty with a very high pressure.  Further review of his download indicates his 95th percentile EPAP pressures 20.1 and IPAP pressure 24.1.  He had a newly released Dreamwear full face mask and was tolerating exceptionally the new design well and much better than the other full face mask that he had been using.  When I reviewed his BiPAP settings, he currently has a ramp time of 45 minutes with initiation of therapy at 14/10.  He had noticed significant improvement since initiating CPAP therapy.  His previous nocturia had significantly reduced.  He was unaware of breakthrough snoring.  He had leak with his  other full face mask, but the new Dreamwear mask is significantly improved.  He denied any breakthrough snoring, daytime sleepiness, hypersomnolence, bruxism, restless legs, hypnogognic hallucinations, or cataplexy.  There are no parasomnias.  An Epworth Sleepiness Scale score was calculated in the office  as shown below:  Epworth Sleepiness Scale: Situation   Chance of Dozing/Sleeping (0 = never , 1 = slight chance , 2 = moderate chance , 3 = high chance )   sitting and reading 0   watching TV 0   sitting  inactive in a public place 0   being a passenger in a motor vehicle for an hour or more 1   lying down in the afternoon 3   sitting and talking to someone 0   sitting quietly after lunch (no alcohol) 0   while stopped for a few minutes in traffic as the driver 0   Total Score  4    Since his sleep evaluation, he states he had used BiPAP continuously until his mask broke and February 2020.  He had lost over 45 pounds since March 2019.  His waist size went from 42 down to 36.    I last saw him in July 2022 at which time he stated he was sleeping well but still admitted to mild snoring but denied witnessed apnea spells.  Without a mask he noticed that he was sleeping better that he had then he had had prior to initiating BiPAP therapy.  He called his DME company which is [employer] about obtaining a new mask and they had requested he needed to see me initially to do this.  He denied daytime sleepiness and an Epworth Sleepiness Scale score was recalculated and this endorsed at 2.  Since I last saw him, he feels well and believes he is sleeping well.  He feels much better BiPAP therapy tickly with his new mask.  His previous 3 times per night nocturia has been reduced to 1 time per night.  He denies any daytime sleepiness or awareness of breakthrough snoring.  A download was obtained in the office today from February 28, 2020 through Mar 28, 2020.  He is meeting compliance standards 3% of usage.  Average usage is 6 hours and 51 minutes.  His BiPAP is set at a minimum EPAP pressure of 12 with maximum IPAP of 25 and his 95th percentile pressure is 19.1/15.1.  I is excellent at 2.6.  There is no leak.    The patient developed COVID-19 viral infection and received treatment with bamlanimivab in January 2021.   Past Medical History:  Diagnosis Date  . At risk for sleep apnea    STOP-BANG= 5       SENT TO PCP 07-12-2015  . CAD (coronary artery disease)    a. s/p PCI with DES x 3 to LAD in 2006 with  bifurcation lesion and Plavix indefinitely was recommended.  . Chronic combined systolic and diastolic CHF (congestive heart failure) ([place])    a. Prior EF normal but EF dropped to 30-35% in 07/2016 in setting of AFIB.  . CKD (chronic kidney disease), stage III   . Dyslipidemia   . Dyspnea    tired quick d/t Afib  . Dyspnea on exertion   . GERD (gastroesophageal reflux disease)   . History of GI bleed   . History of kidney stones   . History of MI (myocardial infarction)    11-05-2005--  periprocedural MI  (cardiac cath) per dr [name] note  .  Hypertension   . Hypertriglyceridemia   . Iron deficiency anemia   . Myocardial infarction ([place])   . Persistent atrial fibrillation ([place])    a. 07/2016, started on Eliquis -> readmitted later that month for AF RVR/acute HF - initially unsuccessful DCCV following TEE, so loaded with amio with repeat successful DCCV 07/2816.  . Right ureteral stone   . S/P drug eluting coronary stent placement    x3  to LAD  2006  . Sleep apnea    wears BIPAP, 14 up to 24   Past Surgical History:  Procedure Laterality Date  . CARDIOVASCULAR STRESS TEST  01-06-2013  dr [name]   normal perfusion study/  no ischemia or infarct/  normal LV function and wall motion , ef 57%  . CARDIOVERSION N/A 08/05/2016   Procedure: CARDIOVERSION;  Surgeon: Sanda Klein, MD;  Location: [place];  Service: Cardiovascular;  Laterality: N/A;  . CARDIOVERSION N/A 08/08/2016   Procedure: CARDIOVERSION;  Surgeon: Jerline Pain, MD;  Location: [facility] ENDOSCOPY;  Service: Cardiovascular;  Laterality: N/A;  . CORONARY ANGIOPLASTY  11-13-2005 dr [name]   Successful touch up post-dilatation of the 3 tandem overlying Cypher stents in proximal to mid LAD (based on IVUS 0% stenosis and patent diagonal branch)/  Cutting Balloon Angioplasty of Ramus branch  . CORONARY ANGIOPLASTY WITH STENT PLACEMENT  11-05-2005   dr [name]   PCI and DES x3 to birfurcation lesion LAD and diagonal branch of LAD/    20% dLM,  30-40% RCA, ef 60%  . CYSTOSCOPY WITH RETROGRADE PYELOGRAM, URETEROSCOPY AND STENT PLACEMENT Right 07/14/2015   Procedure: CYSTOSCOPY WITH RETROGRADE PYELOGRAM, URETEROSCOPY AND STENT PLACEMENT;  Surgeon: Alexis Frock, MD;  Location: [facility];  Service: Urology;  Laterality: Right;  . ELECTROPHYSIOLOGIC STUDY N/A 10/24/2016   Procedure: Atrial Fibrillation Ablation;  Surgeon: Peter Meredith Leeds, MD;  Location: [place] CV LAB;  Service: Cardiovascular;  Laterality: N/A;  . EXTRACORPOREAL SHOCK WAVE LITHOTRIPSY  yrs ago  . HEMORRHOID SURGERY N/A 10/24/2017   Procedure: HEMORRHOIDECTOMY;  Surgeon: Ileana Roup, MD;  Location: [facility];  Service: General;  Laterality: N/A;  . IR GENERIC HISTORICAL  10/23/2016   IR FLUORO GUIDE CV LINE RIGHT 10/23/2016 Greggory Keen, MD [facility]  . IR GENERIC HISTORICAL  10/23/2016   IR US GUIDE VASC ACCESS RIGHT 10/23/2016 Greggory Keen, MD [facility]  . LEFT URETEROSCOPIC STONE EXTRACTION  04-10-2006  . STONE EXTRACTION WITH BASKET Right 07/14/2015   Procedure: STONE EXTRACTION WITH BASKET;  Surgeon: Alexis Frock, MD;  Location[facility];  Service: Urology;  Laterality: Right;  . TEE WITHOUT CARDIOVERSION N/A 08/05/2016   Procedure: TRANSESOPHAGEAL ECHOCARDIOGRAM (TEE);  Surgeon: Sanda Klein, MD;  Location: [facility] ENDOSCOPY;  Service: Cardiovascular;  Laterality: N/A;  . TRANSTHORACIC ECHOCARDIOGRAM  02-04-2012   grade I diastolic dysfunction/  ef 55-60%/  trivial MR and TR/  mild LAE     Current Meds  Medication Sig  . allopurinol (ZYLOPRIM) 100 MG tablet Take 1 tablet by mouth at bedtime.  Marland Kitchen allopurinol (ZYLOPRIM) 300 MG tablet Take 300 mg by mouth daily.  Marland Kitchen atorvastatin (LIPITOR) 40 MG tablet TAKE 1 TABLET EVERY MORNING  . carvedilol (COREG) 25 MG tablet TAKE 1 TABLET TWICE DAILY WITH MEALS  . diphenhydramine-acetaminophen (TYLENOL PM) 25-500 MG TABS tablet Take 1 tablet by mouth at bedtime.    . ferrous sulfate 325 (65 FE) MG tablet Take 1 tablet (325 mg total) by mouth every morning.  Marland Kitchen  furosemide (LASIX) 40 MG tablet TAKE 1 TABLET EVERY DAY  . lisinopril (ZESTRIL) 40 MG tablet Take 1 tablet (40 mg total) by mouth daily.  . nitroGLYCERIN (NITROSTAT) 0.4 MG SL tablet Place 1 tablet (0.4 mg total) under the tongue every 5 (five) minutes as needed for chest pain. X 3 doses  . pantoprazole (PROTONIX) 40 MG tablet TAKE 1 TABLET EVERY DAY  . polyvinyl alcohol (LIQUIFILM TEARS) 1.4 % ophthalmic solution Place 1 drop into the right eye daily.   . potassium chloride SA (KLOR-CON) 20 MEQ tablet TAKE 1 TABLET EVERY DAY  . XARELTO 20 MG TABS tablet TAKE 1 TABLET EVERY DAY   Current Facility-Administered Medications for the 03/29/20 encounter (Telemedicine) with Troy Sine, MD  Medication  . triamcinolone acetonide (KENALOG) 10 MG/ML injection 10 mg  . triamcinolone acetonide (KENALOG) 10 MG/ML injection 10 mg     Allergies:   Patient has no known allergies.   Social History   Tobacco Use  . Smoking status: Never Smoker  . Smokeless tobacco: Never Used  Substance Use Topics  . Alcohol use: No    Comment: 2 beers a year  . Drug use: No     Family Hx: The patient's family history includes Cancer in an other family member; Congestive Heart Failure (age of onset: 44) in his father; Heart attack (age of onset: 7) in his father.  ROS:   Please see the history of present illness.    No fevers, chills, or night sweats No wheezing No recent angina History of Covid 19 viral infection with bamlananivimab infusion Remote history of A. fib ablation unaware of recurrent AF No breakthrough snoring  All other systems reviewed and are negative.   Prior CV studies:   The following studies were reviewed today:  A new download was obtained in the office today from February 28, 2020 through Mar 28, 2020 as described above.  AHI 2.6.  95th percentile pressure 19.1/15.1.  Labs/Other Tests  and Data Reviewed:    EKG: Since this was a virtual visit and ECG was not obtained today.  However I personally reviewed his most recent ECG from October 12, 2019 which showed normal sinus rhythm at 69 bpm.  No ectopy.  QTc interval 450 ms.  Recent Labs: No results found for requested labs within last 8760 hours.   Recent Lipid Panel Lab Results  Component Value Date/Time   CHOL 140 01/01/2013 10:37 AM   TRIG 230.0 (H) 01/01/2013 10:37 AM   HDL 38.20 (L) 01/01/2013 10:37 AM   CHOLHDL 4 01/01/2013 10:37 AM   LDLDIRECT 68.6 01/01/2013 10:37 AM    Wt Readings from Last 3 Encounters:  03/29/20 204 lb (92.5 kg)  10/12/19 207 lb 9.6 oz (94.2 kg)  06/02/19 193 lb 6.4 oz (87.7 kg)     Objective:    Vital Signs:  Ht 5\' 8"  (1.727 m)   Wt 204 lb (92.5 kg)   BMI 31.02 kg/m    He tells me that recent blood pressures have been running in the 130/76-78 range.  Since this was a telemedicine visit I could not physically examine the patient. Breathing was normal and not labored no audible wheezing Palpation of rhythm was stable No chest tightness No abdominal discomfort No awareness of edema Affect normal  ASSESSMENT & PLAN:    1. Obstructive sleep apnea: Previously diagnosed severe sleep apnea with AHI 84.2/h.  He is now back on BiPAP therapy.  Compliance continues to be excellent.  AHI  2.6 with 95th percentile pressure 19.1/15.1 and maximum average pressure 20.6/16.6.  No significant mask leak.  [employer] was his DME company which had been bought out by [employer].  He has been using the ResMed air fit F 30i mask. With his AF we again discussed the importance of optimal compliance with BiPAP since there is increased recurrence with AF if sleep apnea is untreated. 2. PAF: Maintaining sinus rhythm.  Status post ablation December 2017 3. Essential hypertension: Reported blood pressures at home typically been running around 130/76-78.  He continues to be on carvedilol and lisinopril. 4. CAD: No  recurrent anginal symptoms, status post PCI to his LAD 5. Covid 19 infection: Underwent treatment with bamlanivimab in January 2021  COVID-19 Education: The signs and symptoms of COVID-19 were discussed with the patient and how to seek care for testing (follow up with PCP or arrange E-visit).  The importance of social distancing was discussed today.  Time:   Today, I have spent 18 minutes with the patient with telehealth technology discussing the above problems.     Medication Adjustments/Labs and Tests Ordered: Current medicines are reviewed at length with the patient today.  Concerns regarding medicines are outlined above.   Tests Ordered: No orders of the defined types were placed in this encounter.   Medication Changes: No orders of the defined types were placed in this encounter.   Follow Up: In 1 year.  Signed, [name], MD  03/29/2020 3:26 PM    [place]

## 2020-03-29 NOTE — Patient Instructions (Signed)

## 2020-04-05 ENCOUNTER — Encounter: Payer: Self-pay | Admitting: Cardiovascular Disease

## 2020-04-12 ENCOUNTER — Ambulatory Visit: Payer: Medicare HMO | Admitting: Cardiovascular Disease

## 2020-04-18 DIAGNOSIS — E7800 Pure hypercholesterolemia, unspecified: Secondary | ICD-10-CM | POA: Diagnosis not present

## 2020-04-18 DIAGNOSIS — Z0001 Encounter for general adult medical examination with abnormal findings: Secondary | ICD-10-CM | POA: Diagnosis not present

## 2020-04-18 DIAGNOSIS — I2510 Atherosclerotic heart disease of native coronary artery without angina pectoris: Secondary | ICD-10-CM | POA: Diagnosis not present

## 2020-04-18 DIAGNOSIS — R1031 Right lower quadrant pain: Secondary | ICD-10-CM | POA: Diagnosis not present

## 2020-04-18 DIAGNOSIS — I10 Essential (primary) hypertension: Secondary | ICD-10-CM | POA: Diagnosis not present

## 2020-04-18 DIAGNOSIS — Z79899 Other long term (current) drug therapy: Secondary | ICD-10-CM | POA: Diagnosis not present

## 2020-04-18 DIAGNOSIS — N1831 Chronic kidney disease, stage 3a: Secondary | ICD-10-CM | POA: Diagnosis not present

## 2020-04-18 DIAGNOSIS — M1A9XX0 Chronic gout, unspecified, without tophus (tophi): Secondary | ICD-10-CM | POA: Diagnosis not present

## 2020-04-18 DIAGNOSIS — I480 Paroxysmal atrial fibrillation: Secondary | ICD-10-CM | POA: Diagnosis not present

## 2020-05-22 DIAGNOSIS — G4733 Obstructive sleep apnea (adult) (pediatric): Secondary | ICD-10-CM | POA: Diagnosis not present

## 2020-05-26 DIAGNOSIS — G4733 Obstructive sleep apnea (adult) (pediatric): Secondary | ICD-10-CM | POA: Diagnosis not present

## 2020-06-02 ENCOUNTER — Other Ambulatory Visit: Payer: Self-pay | Admitting: Cardiology

## 2020-06-02 DIAGNOSIS — I480 Paroxysmal atrial fibrillation: Secondary | ICD-10-CM

## 2020-06-02 NOTE — Telephone Encounter (Signed)
Prescription refill request for Xarelto received.  Indication: Atrial Fibrillation Last office visit: 10/12/2019 Dr [name] Weight: 92.5 kg Age: 74 Scr: 1.1 04/2020 CrCl: 75.92 ml/min  Prescription refilled

## 2020-10-06 ENCOUNTER — Other Ambulatory Visit: Payer: Self-pay | Admitting: Cardiology

## 2020-10-18 ENCOUNTER — Other Ambulatory Visit: Payer: Self-pay | Admitting: Cardiology

## 2020-11-16 DIAGNOSIS — Z20828 Contact with and (suspected) exposure to other viral communicable diseases: Secondary | ICD-10-CM | POA: Diagnosis not present

## 2020-11-20 ENCOUNTER — Ambulatory Visit: Payer: Medicare HMO | Admitting: Cardiology

## 2020-11-20 ENCOUNTER — Encounter: Payer: Self-pay | Admitting: Cardiology

## 2020-11-20 ENCOUNTER — Other Ambulatory Visit: Payer: Self-pay

## 2020-11-20 DIAGNOSIS — I480 Paroxysmal atrial fibrillation: Secondary | ICD-10-CM | POA: Diagnosis not present

## 2020-11-20 MED ORDER — NITROGLYCERIN 0.4 MG SL SUBL: 0.4000 mg | SUBLINGUAL_TABLET | SUBLINGUAL | 1 refills | Status: DC | PRN

## 2020-11-20 MED ORDER — FUROSEMIDE 40 MG PO TABS: 40.0000 mg | ORAL_TABLET | Freq: Every day | ORAL | 3 refills | Status: DC

## 2020-11-20 MED ORDER — POTASSIUM CHLORIDE CRYS ER 20 MEQ PO TBCR: 20.0000 meq | EXTENDED_RELEASE_TABLET | Freq: Every day | ORAL | 3 refills | Status: DC

## 2020-11-20 MED ORDER — PANTOPRAZOLE SODIUM 40 MG PO TBEC: 40.0000 mg | DELAYED_RELEASE_TABLET | Freq: Every day | ORAL | 3 refills | Status: DC

## 2020-11-20 MED ORDER — XARELTO 20 MG PO TABS: 20.0000 mg | ORAL_TABLET | Freq: Every day | ORAL | 3 refills | Status: DC

## 2020-11-20 MED ORDER — LISINOPRIL 40 MG PO TABS: 40.0000 mg | ORAL_TABLET | Freq: Every day | ORAL | 3 refills | Status: DC

## 2020-11-20 MED ORDER — CARVEDILOL 25 MG PO TABS: 25.0000 mg | ORAL_TABLET | Freq: Two times a day (BID) | ORAL | 3 refills | Status: DC

## 2020-11-20 MED ORDER — ATORVASTATIN CALCIUM 40 MG PO TABS: 40.0000 mg | ORAL_TABLET | Freq: Every morning | ORAL | 2 refills | Status: DC

## 2020-11-20 NOTE — Progress Notes (Signed)
Electrophysiology Office Note   Date:  11/20/2020   ID:  Peter [name], DOB [birth year], MRN ID:[id number]  PCP:  [name], MD  Cardiologist:  Marlou Porch Primary Electrophysiologist:  Constance Haw, MD    No chief complaint on file.    History of Present Illness: Peter [name] is a 75 y.o. male who presents today for electrophysiology evaluation.     He has a history of coronary artery disease status post PCI x3 to the LAD in 2006, previous chronic diastolic heart failure, hyperlipidemia, GERD, history of GI bleed, hypertension, hypertriglyceridemia, iron deficiency anemia, obesity, CKD stage III, atrial fibrillation.  He is status post AF ablation 10/24/2016.  Today, denies symptoms of palpitations, chest pain, shortness of breath, orthopnea,, PND, lower extremity edema, claudication, dizziness, presyncope, syncope, bleeding, or neurologic sequela. The patient is tolerating medications without difficulties.  Since last being seen he has done well.  He has had no further episodes of atrial fibrillation following his son got COVID last summer.  He was hospitalized in the ICU intubated for many weeks.  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  Aside from that, the patient is doing well and has no complaints.  Past Medical History:  Diagnosis Date  . At risk for sleep apnea    STOP-BANG= 5       SENT TO PCP 07-12-2015  . CAD (coronary artery disease)    a. s/p PCI with DES x 3 to LAD in 2006 with bifurcation lesion and Plavix indefinitely was recommended.  . Chronic combined systolic and diastolic CHF (congestive heart failure) ([place])    a. Prior EF normal but EF dropped to 30-35% in 07/2016 in setting of AFIB.  . CKD (chronic kidney disease), stage III ([place])   . Dyslipidemia   . Dyspnea    tired quick d/t Afib  . Dyspnea on exertion   . GERD (gastroesophageal reflux disease)   . History of GI bleed   . History of kidney stones   . History of MI (myocardial infarction)     11-05-2005--  periprocedural MI  (cardiac cath) per dr [name] note  . Hypertension   . Hypertriglyceridemia   . Iron deficiency anemia   . Myocardial infarction ([place])   . Persistent atrial fibrillation ([place])    a. 07/2016, started on Eliquis -> readmitted later that month for AF RVR/acute HF - initially unsuccessful DCCV following TEE, so loaded with amio with repeat successful DCCV 07/2816.  . Right ureteral stone   . S/P drug eluting coronary stent placement    x3  to LAD  2006  . Sleep apnea    wears BIPAP, 14 up to 24   Past Surgical History:  Procedure Laterality Date  . CARDIOVASCULAR STRESS TEST  01-06-2013  dr [name]   normal perfusion study/  no ischemia or infarct/  normal LV function and wall motion , ef 57%  . CARDIOVERSION N/A 08/05/2016   Procedure: CARDIOVERSION;  Surgeon: Sanda Klein, MD;  Location: [place];  Service: Cardiovascular;  Laterality: N/A;  . CARDIOVERSION N/A 08/08/2016   Procedure: CARDIOVERSION;  Surgeon: Jerline Pain, MD;  Location: [facility] ENDOSCOPY;  Service: Cardiovascular;  Laterality: N/A;  . CORONARY ANGIOPLASTY  11-13-2005 dr [name]   Successful touch up post-dilatation of the 3 tandem overlying Cypher stents in proximal to mid LAD (based on IVUS 0% stenosis and patent diagonal branch)/  Cutting Balloon Angioplasty of Ramus branch  . CORONARY ANGIOPLASTY WITH STENT PLACEMENT  11-05-2005  dr [name]   PCI and DES x3 to birfurcation lesion LAD and diagonal branch of LAD/   20% dLM,  30-40% RCA, ef 60%  . CYSTOSCOPY WITH RETROGRADE PYELOGRAM, URETEROSCOPY AND STENT PLACEMENT Right 07/14/2015   Procedure: CYSTOSCOPY WITH RETROGRADE PYELOGRAM, URETEROSCOPY AND STENT PLACEMENT;  Surgeon: Alexis Frock, MD;  [facility];  Service: Urology;  Laterality: Right;  . ELECTROPHYSIOLOGIC STUDY N/A 10/24/2016   Procedure: Atrial Fibrillation Ablation;  Surgeon: Debar Plate Meredith Leeds, MD;  Location: [place] CV LAB;  Service:  Cardiovascular;  Laterality: N/A;  . EXTRACORPOREAL SHOCK WAVE LITHOTRIPSY  yrs ago  . HEMORRHOID SURGERY N/A 10/24/2017   Procedure: HEMORRHOIDECTOMY;  Surgeon: Ileana Roup, MD;  Location: [place];  Service: General;  Laterality: N/A;  . IR GENERIC HISTORICAL  10/23/2016   IR FLUORO GUIDE CV LINE RIGHT 10/23/2016 Greggory Keen, MD [facility]  . IR GENERIC HISTORICAL  10/23/2016   IR US GUIDE VASC ACCESS RIGHT 10/23/2016 Greggory Keen, MD [facility]  . LEFT URETEROSCOPIC STONE EXTRACTION  04-10-2006  . STONE EXTRACTION WITH BASKET Right 07/14/2015   Procedure: STONE EXTRACTION WITH BASKET;  Surgeon: Alexis Frock, MD;  [facility];  Service: Urology;  Laterality: Right;  . TEE WITHOUT CARDIOVERSION N/A 08/05/2016   Procedure: TRANSESOPHAGEAL ECHOCARDIOGRAM (TEE);  Surgeon: Sanda Klein, MD;  Location: [facility] ENDOSCOPY;  Service: Cardiovascular;  Laterality: N/A;  . TRANSTHORACIC ECHOCARDIOGRAM  02-04-2012   grade I diastolic dysfunction/  ef 55-60%/  trivial MR and TR/  mild LAE     Current Outpatient Medications  Medication Sig Dispense Refill  . allopurinol (ZYLOPRIM) 300 MG tablet Take 300 mg by mouth daily.    . diphenhydramine-acetaminophen (TYLENOL PM) 25-500 MG TABS tablet Take 1 tablet by mouth at bedtime.    . ferrous sulfate 325 (65 FE) MG tablet Take 1 tablet (325 mg total) by mouth every morning. 90 tablet 2  . polyvinyl alcohol (LIQUIFILM TEARS) 1.4 % ophthalmic solution Place 1 drop into the right eye daily.    Marland Kitchen atorvastatin (LIPITOR) 40 MG tablet Take 1 tablet (40 mg total) by mouth every morning. 90 tablet 2  . carvedilol (COREG) 25 MG tablet Take 1 tablet (25 mg total) by mouth 2 (two) times daily with a meal. 180 tablet 3  . furosemide (LASIX) 40 MG tablet Take 1 tablet (40 mg total) by mouth daily. 90 tablet 3  . lisinopril (ZESTRIL) 40 MG tablet Take 1 tablet (40 mg total) by mouth daily. 90 tablet 3  . nitroGLYCERIN (NITROSTAT) 0.4  MG SL tablet Place 1 tablet (0.4 mg total) under the tongue every 5 (five) minutes as needed for chest pain. X 3 doses 25 tablet 1  . pantoprazole (PROTONIX) 40 MG tablet Take 1 tablet (40 mg total) by mouth daily. 90 tablet 3  . potassium chloride SA (KLOR-CON) 20 MEQ tablet Take 1 tablet (20 mEq total) by mouth daily. 90 tablet 3  . XARELTO 20 MG TABS tablet Take 1 tablet (20 mg total) by mouth daily. 90 tablet 3   Current Facility-Administered Medications  Medication Dose Route Frequency Provider Last Rate Last Admin  . triamcinolone acetonide (KENALOG) 10 MG/ML injection 10 mg  10 mg Other Once Regal, Norman S, DPM      . triamcinolone acetonide (KENALOG) 10 MG/ML injection 10 mg  10 mg Other Once Wallene Huh, DPM        Allergies:   Patient has no known allergies.  Social History:  The patient  reports that he has never smoked. He has never used smokeless tobacco. He reports that he does not drink alcohol and does not use drugs.   Family History:  The patient's family history includes Cancer in an other family member; Congestive Heart Failure (age of onset: 61) in his father; Heart attack (age of onset: 46) in his father.   ROS:  Please see the history of present illness.   Otherwise, review of systems is positive for none.   All other systems are reviewed and negative.   PHYSICAL EXAM: VS:  BP 118/82   Pulse 69   Ht 5\' 8"  (1.727 m)   Wt 209 lb 6.4 oz (95 kg)   SpO2 95%   BMI 31.84 kg/m  , BMI Body mass index is 31.84 kg/m. GEN: Well nourished, well developed, in no acute distress  HEENT: normal  Neck: no JVD, carotid bruits, or masses Cardiac: RRR; no murmurs, rubs, or gallops,no edema  Respiratory:  clear to auscultation bilaterally, normal work of breathing GI: soft, nontender, nondistended, + BS MS: no deformity or atrophy  Skin: warm and dry Neuro:  Strength and sensation are intact Psych: euthymic mood, full affect  EKG:  EKG is ordered today. Personal review  of the ekg ordered shows sinus rhythm, rate 69  Recent Labs: No results found for requested labs within last 8760 hours.    Lipid Panel     Component Value Date/Time   CHOL 140 01/01/2013 1037   TRIG 230.0 (H) 01/01/2013 1037   HDL 38.20 (L) 01/01/2013 1037   CHOLHDL 4 01/01/2013 1037   VLDL 46.0 (H) 01/01/2013 1037   LDLDIRECT 68.6 01/01/2013 1037     Wt Readings from Last 3 Encounters:  11/20/20 209 lb 6.4 oz (95 kg)  03/29/20 204 lb (92.5 kg)  10/12/19 207 lb 9.6 oz (94.2 kg)      Other studies Reviewed: Additional studies/ records that were reviewed today include: TEE 11/22/16 Review of the above records today demonstrates:  - Left ventricle: The cavity size was normal. Wall thickness was   increased in a pattern of mild LVH. Systolic function was normal.   The estimated ejection fraction was in the range of 55% to 60%.   Left ventricular diastolic function parameters were normal. - Mitral valve: Calcified annulus. Mildly thickened leaflets . - Left atrium: The atrium was mildly dilated. - Atrial septum: No defect or patent foramen ovale was identified.   ASSESSMENT AND PLAN:  1.  Persistent atrial fibrillation/flutter: Status post ablation.  Currently on Xarelto and Coreg.  Remains in sinus rhythm.  CHA2DS2-VASc of 4.  He is currently feeling well and has had no further atrial fibrillation.  No changes.  2. likely nonischemic cardiomyopathy: Ejection fraction has normalized.  No changes.  3. hypertension: Currently well controlled  4. coronary artery disease status post PCI to the LAD in 2006: Currently asymptomatic.  Changes  5. obstructive sleep apnea: BiPAP compliance.  Current medicines are reviewed at length with the patient today.   The patient does not have concerns regarding his medicines.  The following changes were made today: None  Labs/ tests ordered today include:  Orders Placed This Encounter  Procedures  . EKG 12-Lead     Disposition:   FU  with Vi Biddinger 12 months  Signed, Naitik Hermann Meredith Leeds, MD  11/20/2020 10:21 AM     [facility] [address] [place] [address] [phone] (office) (  [phone] (fax)

## 2020-11-20 NOTE — Patient Instructions (Addendum)
Medication Instructions:  Your physician recommends that you continue on your current medications as directed. Please refer to the Current Medication list given to you today.  *If you need a refill on your cardiac medications before your next appointment, please call your pharmacy*   Lab Work: None ordered   Testing/Procedures: None ordered   Follow-Up: At [facility], you and your health needs are our priority.  As part of our continuing mission to provide you with exceptional heart care, we have created designated Provider Care Teams.  These Care Teams include your primary Cardiologist (physician) and Advanced Practice Providers (APPs -  Physician Assistants and Nurse Practitioners) who all work together to provide you with the care you need, when you need it.  We recommend signing up for the patient portal called "MyChart".  Sign up information is provided on this After Visit Summary.  MyChart is used to connect with patients for Virtual Visits (Telemedicine).  Patients are able to view lab/test results, encounter notes, upcoming appointments, etc.  Non-urgent messages can be sent to your provider as well.   To learn more about what you can do with MyChart, go to [email or web address].    Your next appointment:   1 year(s)  The format for your next appointment:   In Person  Provider:   Allegra Lai, MD    Thank you for choosing [facility]!!   Trinidad Curet, RN [phone] Returns to go

## 2021-05-15 DIAGNOSIS — Z0001 Encounter for general adult medical examination with abnormal findings: Secondary | ICD-10-CM | POA: Diagnosis not present

## 2021-05-15 DIAGNOSIS — K219 Gastro-esophageal reflux disease without esophagitis: Secondary | ICD-10-CM | POA: Diagnosis not present

## 2021-05-15 DIAGNOSIS — I2510 Atherosclerotic heart disease of native coronary artery without angina pectoris: Secondary | ICD-10-CM | POA: Diagnosis not present

## 2021-05-15 DIAGNOSIS — M1A9XX0 Chronic gout, unspecified, without tophus (tophi): Secondary | ICD-10-CM | POA: Diagnosis not present

## 2021-05-15 DIAGNOSIS — I10 Essential (primary) hypertension: Secondary | ICD-10-CM | POA: Diagnosis not present

## 2021-05-15 DIAGNOSIS — Z79899 Other long term (current) drug therapy: Secondary | ICD-10-CM | POA: Diagnosis not present

## 2021-05-15 DIAGNOSIS — M25551 Pain in right hip: Secondary | ICD-10-CM | POA: Diagnosis not present

## 2021-05-15 DIAGNOSIS — E7800 Pure hypercholesterolemia, unspecified: Secondary | ICD-10-CM | POA: Diagnosis not present

## 2021-05-15 DIAGNOSIS — R7309 Other abnormal glucose: Secondary | ICD-10-CM | POA: Diagnosis not present

## 2021-05-15 DIAGNOSIS — I480 Paroxysmal atrial fibrillation: Secondary | ICD-10-CM | POA: Diagnosis not present

## 2021-05-29 ENCOUNTER — Encounter: Payer: Self-pay | Admitting: Orthopaedic Surgery

## 2021-05-29 ENCOUNTER — Ambulatory Visit (INDEPENDENT_AMBULATORY_CARE_PROVIDER_SITE_OTHER): Payer: Medicare HMO

## 2021-05-29 ENCOUNTER — Other Ambulatory Visit: Payer: Self-pay

## 2021-05-29 ENCOUNTER — Ambulatory Visit: Payer: Medicare HMO | Admitting: Orthopaedic Surgery

## 2021-05-29 DIAGNOSIS — M1611 Unilateral primary osteoarthritis, right hip: Secondary | ICD-10-CM | POA: Diagnosis not present

## 2021-05-29 DIAGNOSIS — M25551 Pain in right hip: Secondary | ICD-10-CM | POA: Diagnosis not present

## 2021-05-29 NOTE — Progress Notes (Signed)
Office Visit Note   Patient: Peter [name]           Date of Birth: 27-Jul-1946           MRN: [id number] Visit Date: 05/29/2021              Requested by: [name], MD [place],  [place] [address] PCP: [name], MD   Assessment & Plan: Visit Diagnoses:  1. Primary osteoarthritis of right hip     Plan: Even though the x-rays show moderately severe DJD from a clinical standpoint he is actually pretty well compensated and doing very well.  I do not feel that he needs a hip replacement right now.  He may think about make an appointment with Dr. Junius Roads for cortisone injection if he is interested in trying this but otherwise he will just stick with over-the-counter medications for now.  We will see him back as needed.  Follow-Up Instructions: Return if symptoms worsen or fail to improve.   Orders:  Orders Placed This Encounter  Procedures   XR HIP UNILAT W OR W/O PELVIS 2-3 VIEWS RIGHT   No orders of the defined types were placed in this encounter.     Procedures: No procedures performed   Clinical Data: No additional findings.   Subjective: Chief Complaint  Patient presents with   Right Hip - Pain    Peter [name] is a very pleasant 75 year old gentleman who his family members I have taken care of in the past who comes in for evaluation of chronic right groin pain for about a year.  Denies any injuries or trauma or surgeries.  He has increased pain when going downstairs when he is planting on the right leg.  He takes Advil states that pain feels like a pulled muscle.  He has done physical therapy and home exercises without any relief.  These were prescribed by his PCP.  He has pain when he changes directions with the right foot planted.  Activity does make it worse but overall the pain is 2 out of 10.   Review of Systems  Constitutional: Negative.   All other systems reviewed and are negative.   Objective: Vital Signs: There were  no vitals taken for this visit.  Physical Exam Vitals and nursing note reviewed.  Constitutional:      Appearance: He is well-developed.  HENT:     Head: Normocephalic and atraumatic.  Eyes:     Pupils: Pupils are equal, round, and reactive to light.  Pulmonary:     Effort: Pulmonary effort is normal.  Abdominal:     Palpations: Abdomen is soft.  Musculoskeletal:        General: Normal range of motion.     Cervical back: Neck supple.  Skin:    General: Skin is warm.  Neurological:     Mental Status: He is alert and oriented to person, place, and time.  Psychiatric:        Behavior: Behavior normal.        Thought Content: Thought content normal.        Judgment: Judgment normal.    Ortho Exam Right hip shows pretty good range of motion without significant pain.  He is ambulating pretty well without much of a limp.  He does not have any tenderness to palpation. Specialty Comments:  No specialty comments available.  Imaging: XR HIP UNILAT W OR W/O PELVIS 2-3 VIEWS RIGHT  Result Date: 05/29/2021 Moderately severe  bilateral hip DJD with cam deformities    PMFS History: Patient Active Problem List   Diagnosis Date Noted   Diverticulitis 02/28/2018   RLQ abdominal pain    Sigmoid diverticulitis    Acute blood loss anemia 11/13/2017   Anticoagulation adequate 11/12/2016   Epistaxis, recurrent 11/12/2016   AF (atrial fibrillation) ([place]) 10/24/2016   Obesity 08/09/2016   Atrial fibrillation with RVR ([place]) 08/03/2016   New onset a-fib ([place]) 07/16/2016   Persistent atrial fibrillation ([place]) 84/66/5993   Acute systolic (congestive) heart failure ([facility])    Ejection fraction    Iron deficiency anemia due to chronic blood loss 02/04/2012   Anemia 02/03/2012   CKD (chronic kidney disease), stage III ([place]) 02/03/2012   DOE (dyspnea on exertion) 01/28/2012   Hypertension    Coronary artery disease due to lipid rich plaque    Dyslipidemia    GERD (gastroesophageal reflux  disease)    Past Medical History:  Diagnosis Date   At risk for sleep apnea    STOP-BANG= 5       SENT TO PCP 07-12-2015   CAD (coronary artery disease)    a. s/p PCI with DES x 3 to LAD in 2006 with bifurcation lesion and Plavix indefinitely was recommended.   Chronic combined systolic and diastolic CHF (congestive heart failure) ([place])    a. Prior EF normal but EF dropped to 30-35% in 07/2016 in setting of AFIB.   CKD (chronic kidney disease), stage III ([facility])    Dyslipidemia    Dyspnea    tired quick d/t Afib   Dyspnea on exertion    GERD (gastroesophageal reflux disease)    History of GI bleed    History of kidney stones    History of MI (myocardial infarction)    11-05-2005--  periprocedural MI  (cardiac cath) per dr [name] note   Hypertension    Hypertriglyceridemia    Iron deficiency anemia    Myocardial infarction [facility])    Persistent atrial fibrillation ([place])    a. 07/2016, started on Eliquis -> readmitted later that month for AF RVR/acute HF - initially unsuccessful DCCV following TEE, so loaded with amio with repeat successful DCCV 07/2816.   Right ureteral stone    S/P drug eluting coronary stent placement    x3  to LAD  2006   Sleep apnea    wears BIPAP, 14 up to 24    Family History  Problem Relation Age of Onset   Heart attack Father 47   Congestive Heart Failure Father 54   Cancer Other     Past Surgical History:  Procedure Laterality Date   CARDIOVASCULAR STRESS TEST  01-06-2013  dr [name]   normal perfusion study/  no ischemia or infarct/  normal LV function and wall motion , ef 57%   CARDIOVERSION N/A 08/05/2016   Procedure: CARDIOVERSION;  Surgeon: Sanda Klein, MD;  Location: [place];  Service: Cardiovascular;  Laterality: N/A;   CARDIOVERSION N/A 08/08/2016   Procedure: CARDIOVERSION;  Surgeon: Jerline Pain, MD;  Location: [place];  Service: Cardiovascular;  Laterality: N/A;   CORONARY ANGIOPLASTY  11-13-2005 dr [name]   Successful touch up  post-dilatation of the 3 tandem overlying Cypher stents in proximal to mid LAD (based on IVUS 0% stenosis and patent diagonal branch)/  Cutting Balloon Angioplasty of Ramus branch   CORONARY ANGIOPLASTY WITH STENT PLACEMENT  11-05-2005   dr [name]   PCI and DES x3 to birfurcation lesion LAD and diagonal branch  of LAD/   20% dLM,  30-40% RCA, ef 60%   CYSTOSCOPY WITH RETROGRADE PYELOGRAM, URETEROSCOPY AND STENT PLACEMENT Right 07/14/2015   Procedure: CYSTOSCOPY WITH RETROGRADE PYELOGRAM, URETEROSCOPY AND STENT PLACEMENT;  Surgeon: Alexis Frock, MD;  Location: [facility];  Service: Urology;  Laterality: Right;   ELECTROPHYSIOLOGIC STUDY N/A 10/24/2016   Procedure: Atrial Fibrillation Ablation;  Surgeon: Will Meredith Leeds, MD;  Location: [place] CV LAB;  Service: Cardiovascular;  Laterality: N/A;   EXTRACORPOREAL SHOCK WAVE LITHOTRIPSY  yrs ago   HEMORRHOID SURGERY N/A 10/24/2017   Procedure: HEMORRHOIDECTOMY;  Surgeon: Ileana Roup, MD;  Location: [place] [facility];  Service: General;  Laterality: N/A;   IR GENERIC HISTORICAL  10/23/2016   IR FLUORO GUIDE CV LINE RIGHT 10/23/2016 Greggory Keen, MD [facility]   IR GENERIC HISTORICAL  10/23/2016   IR US GUIDE VASC ACCESS RIGHT 10/23/2016 Greggory Keen, MD [facility]   LEFT URETEROSCOPIC STONE EXTRACTION  04-10-2006   STONE EXTRACTION WITH BASKET Right 07/14/2015   Procedure: STONE EXTRACTION WITH BASKET;  Surgeon: Alexis Frock, MD;  Location: [facility];  Service: Urology;  Laterality: Right;   TEE WITHOUT CARDIOVERSION N/A 08/05/2016   Procedure: TRANSESOPHAGEAL ECHOCARDIOGRAM (TEE);  Surgeon: Sanda Klein, MD;  Location: [facility] ENDOSCOPY;  Service: Cardiovascular;  Laterality: N/A;   TRANSTHORACIC ECHOCARDIOGRAM  02-04-2012   grade I diastolic dysfunction/  ef 55-60%/  trivial MR and TR/  mild LAE   Social History   Occupational History   Not on file  Tobacco Use   Smoking status: Never   Smokeless  tobacco: Never  Vaping Use   Vaping Use: Never used  Substance and Sexual Activity   Alcohol use: No    Comment: 2 beers a year   Drug use: No   Sexual activity: Not on file

## 2021-07-12 ENCOUNTER — Other Ambulatory Visit: Payer: Self-pay | Admitting: Cardiovascular Disease

## 2021-07-12 ENCOUNTER — Other Ambulatory Visit: Payer: Self-pay

## 2021-07-12 ENCOUNTER — Ambulatory Visit: Payer: Medicare HMO | Admitting: Cardiovascular Disease

## 2021-07-12 ENCOUNTER — Encounter: Payer: Self-pay | Admitting: Cardiovascular Disease

## 2021-07-12 VITALS — BP 120/76 | HR 69 | Ht 68.0 in | Wt 199.2 lb

## 2021-07-12 DIAGNOSIS — I480 Paroxysmal atrial fibrillation: Secondary | ICD-10-CM

## 2021-07-12 DIAGNOSIS — Z7901 Long term (current) use of anticoagulants: Secondary | ICD-10-CM | POA: Diagnosis not present

## 2021-07-12 DIAGNOSIS — I130 Hypertensive heart and chronic kidney disease with heart failure and stage 1 through stage 4 chronic kidney disease, or unspecified chronic kidney disease: Secondary | ICD-10-CM | POA: Diagnosis not present

## 2021-07-12 DIAGNOSIS — I10 Essential (primary) hypertension: Secondary | ICD-10-CM | POA: Diagnosis not present

## 2021-07-12 DIAGNOSIS — E785 Hyperlipidemia, unspecified: Secondary | ICD-10-CM

## 2021-07-12 DIAGNOSIS — I493 Ventricular premature depolarization: Secondary | ICD-10-CM

## 2021-07-12 DIAGNOSIS — I509 Heart failure, unspecified: Secondary | ICD-10-CM | POA: Diagnosis not present

## 2021-07-12 DIAGNOSIS — G4733 Obstructive sleep apnea (adult) (pediatric): Secondary | ICD-10-CM

## 2021-07-12 NOTE — Patient Instructions (Signed)

## 2021-07-12 NOTE — Progress Notes (Signed)
Cardiology Office Note    Date:  07/12/2021   ID:  Peter [name], DOB [birth year], MRN [id number]  PCP:  [name], MD  Cardiologist:  [name], MD (sleep); [name]  39-monthF/U sleep clinic evaluation  History of Present Illness:  Peter CARDINis a 75y.o. male who is followed by Dr. MCandee Furbishfor cardiology care and Dr. CCurt Bearsfollowing his atrial fibrillation ablation.  Mr. BYellowhairhas a history of CAD and is status post PCI to his LAD in 2006, and is status post atrial fibrillation ablation in December 2017.  He has a history of chronic systolic heart failure, remote GI bleed, GERD, iron deficiency anemia, and hyperlipidemia.    Due to his significant atrial fibrillation history, he was referred for a sleep study which was done as a split-night protocol on 10/09/2016.  This revealed severe sleep apnea with an AHI of 84.2 per hour.  He was unable to achieve rems sleep on the diagnostic portion of the study.  He at significant oxygen desaturation to 63% with time below 88% at 53.9 minutes.  CPAP was implemented and he was titrated up to 19 cm water pressure; however, due to continued events he was switched to BiPAP therapy.  He was titrated up to 27/23 and AHI was still increased at 6.7, with an RDI of 13.4, and oxygen desaturation to 87%.  Optimal titration was not achieved.  During that study, he was in atrial fibrillation.  He was referred for BiPAP set up and this was initiated on 12/09/2016.  I saw him for initial sleep evaluation in April 2018. A download of his ResMed AirCurve 10 VAuto unit  set at a maximum IPAP pressure of 25 and minimum EPAP pressure of 20 demonstrated compliance with 97% of days with usage and 93% of days with usage greater than 4 hours.  Average usage on days used was 7 hours and 33 minutes.  His AHI was excellent at 0.7.  He was having difficulty with a very high pressure.  Further review of his download indicates his 95th percentile EPAP  pressures 20.1 and IPAP pressure 24.1.  He had a newly released Dreamwear full face mask and was tolerating exceptionally the new design well and much better than the other full face mask that he had been using.  When I reviewed his BiPAP settings, he currently has a ramp time of 45 minutes with initiation of therapy at 14/10.  He had noticed significant improvement since initiating CPAP therapy.  His previous nocturia had significantly reduced.  He was unaware of breakthrough snoring.  He had leak with his other full face mask, but the new Dreamwear mask is significantly improved.  He denied any breakthrough snoring, daytime sleepiness, hypersomnolence, bruxism, restless legs, hypnogognic hallucinations, or cataplexy.  There are no parasomnias.  An Epworth Sleepiness Scale score was calculated in the office  as shown below:  Epworth Sleepiness Scale: Situation   Chance of Dozing/Sleeping (0 = never , 1 = slight chance , 2 = moderate chance , 3 = high chance )   sitting and reading 0   watching TV 0   sitting inactive in a public place 0   being a passenger in a motor vehicle for an hour or more 1   lying down in the afternoon 3   sitting and talking to someone 0   sitting quietly after lunch (no alcohol) 0   while stopped for a few minutes in traffic as  the driver 0   Total Score  4    Since his sleep evaluation, he states he had used BiPAP continuously until his mask broke and February 2020.  He had lost over 45 pounds since March 2019.  His waist size went from 42 down to 36.  Without a mass, he noticed that he was sleeping better that he had then he had had prior to initiating BiPAP therapy.  He called his DME company which is [employer] about obtaining a new mask and they had requested he needed to see me initially to do this.    I saw him in the office in July 2020.  At that time he admitted to mild snoring and denied any witnessed apneic spells.  He denied daytime sleepiness and a new  Epworth Sleepiness Scale score was calculated which endorsed at 2.    He was last evaluated by me in a telemedicine visit on Mar 29, 2020 for evaluation he felt he was sleeping well.  He was feeling much better with BiPAP therapy with his new mask.  His 3 time nocturia had significantly improved to 1 or less per night.  He denied any breakthrough snoring.  A download was obtained from February 28, 2020 through Mar 28, 2020.  He was meeting compliance standards with average use at 6 hours and 51 minutes.  BiPAP settings were EPAP of 12 with maximum IPAP of 25 and 95th percentile pressure was 19.1/15.1 AHI was excellent at 2.6.  Over the past year, he has continued to use BiPAP with excellent compliance.  However, over the past month he has begun to notice significant mask leak.  His mask is a ResMed AirFit F 30i medium mask.  He has significantly preferred this mass to his prior 1.  Typically he goes to bed around 1 PM and often wakes up for good at 7-7 30.  However over the past several months he has been taking his mask off when he returns from the bathroom around 4:56 AM and does not put it back on due to the significant pressure marks made on his face when he awakens.  Over the last several weeks he has noticed significant increase mask leak and red rather than green mask leak has come up on his morning monitor.  He feels significantly better using BiPAP therapy.  He previously had [employer] is his DME company which was brought out by [employer].  He has not yet contacted them about his mask leak.  He denies any chest pain or shortness of breath.  He is unaware of any recurrent atrial fibrillation.  He continues to be on lisinopril 40 mg, furosemide 40 mg, carvedilol 25 mg twice a day for blood pressure and rate control.  He is on Xarelto for anticoagulation and atorvastatin 40 mg for hyperlipidemia.  He is unaware of any breakthrough snoring and denies any residual daytime sleepiness.   Past Medical History:   Diagnosis Date   At risk for sleep apnea    STOP-BANG= 5       SENT TO PCP 07-12-2015   CAD (coronary artery disease)    a. s/p PCI with DES x 3 to LAD in 2006 with bifurcation lesion and Plavix indefinitely was recommended.   Chronic combined systolic and diastolic CHF (congestive heart failure) ([place])    a. Prior EF normal but EF dropped to 30-35% in 07/2016 in setting of AFIB.   CKD (chronic kidney disease), stage III ([place])  Dyslipidemia    Dyspnea    tired quick d/t Afib   Dyspnea on exertion    GERD (gastroesophageal reflux disease)    History of GI bleed    History of kidney stones    History of MI (myocardial infarction)    11-05-2005--  periprocedural MI  (cardiac cath) per dr [name] note   Hypertension    Hypertriglyceridemia    Iron deficiency anemia    Myocardial infarction [facility])    Persistent atrial fibrillation ([place])    a. 07/2016, started on Eliquis -> readmitted later that month for AF RVR/acute HF - initially unsuccessful DCCV following TEE, so loaded with amio with repeat successful DCCV 07/2816.   Right ureteral stone    S/P drug eluting coronary stent placement    x3  to LAD  2006   Sleep apnea    wears BIPAP, 14 up to 24    Past Surgical History:  Procedure Laterality Date   CARDIOVASCULAR STRESS TEST  01-06-2013  dr [name]   normal perfusion study/  no ischemia or infarct/  normal LV function and wall motion , ef 57%   CARDIOVERSION N/A 08/05/2016   Procedure: CARDIOVERSION;  Surgeon: Sanda Klein, MD;  Location: [facility];  Service: Cardiovascular;  Laterality: N/A;   CARDIOVERSION N/A 08/08/2016   Procedure: CARDIOVERSION;  Surgeon: Jerline Pain, MD;  Location: [facility];  Service: Cardiovascular;  Laterality: N/A;   CORONARY ANGIOPLASTY  11-13-2005 dr [name]   Successful touch up post-dilatation of the 3 tandem overlying Cypher stents in proximal to mid LAD (based on IVUS 0% stenosis and patent diagonal branch)/  Cutting Balloon Angioplasty of  [place] branch   CORONARY ANGIOPLASTY WITH STENT PLACEMENT  11-05-2005   dr [name]   PCI and DES x3 to birfurcation lesion LAD and diagonal branch of LAD/   20% dLM,  30-40% RCA, ef 60%   CYSTOSCOPY WITH RETROGRADE PYELOGRAM, URETEROSCOPY AND STENT PLACEMENT Right 07/14/2015   Procedure: CYSTOSCOPY WITH RETROGRADE PYELOGRAM, URETEROSCOPY AND STENT PLACEMENT;  Surgeon: Alexis Frock, MD;  Location[facility];  Service: Urology;  Laterality: Right;   ELECTROPHYSIOLOGIC STUDY N/A 10/24/2016   Procedure: Atrial Fibrillation Ablation;  Surgeon: Will Meredith Leeds, MD;  Location: [facility];  Service: Cardiovascular;  Laterality: N/A;   EXTRACORPOREAL SHOCK WAVE LITHOTRIPSY  yrs ago   HEMORRHOID SURGERY N/A 10/24/2017   Procedure: HEMORRHOIDECTOMY;  Surgeon: Ileana Roup, MD;  Location: [facility];  Service: General;  Laterality: N/A;   IR GENERIC HISTORICAL  10/23/2016   IR FLUORO GUIDE CV LINE RIGHT 10/23/2016 Greggory Keen, MD [facility]   IR GENERIC HISTORICAL  10/23/2016   IR US GUIDE VASC ACCESS RIGHT 10/23/2016 Greggory Keen, MD [facility]   LEFT URETEROSCOPIC STONE EXTRACTION  04-10-2006   STONE EXTRACTION WITH BASKET Right 07/14/2015   Procedure: STONE EXTRACTION WITH BASKET;  Surgeon: Alexis Frock, MD;  Location: [facility];  Service: Urology;  Laterality: Right;   TEE WITHOUT CARDIOVERSION N/A 08/05/2016   Procedure: TRANSESOPHAGEAL ECHOCARDIOGRAM (TEE);  Surgeon: Sanda Klein, MD;  Location: [facility] ENDOSCOPY;  Service: Cardiovascular;  Laterality: N/A;   TRANSTHORACIC ECHOCARDIOGRAM  02-04-2012   grade I diastolic dysfunction/  ef 55-60%/  trivial MR and TR/  mild LAE    Current Medications: Outpatient Medications Prior to Visit  Medication Sig Dispense Refill   allopurinol (ZYLOPRIM) 300 MG tablet Take 300 mg by mouth daily.     atorvastatin (LIPITOR) 40 MG tablet Take  1 tablet (40 mg total) by mouth every morning. 90 tablet 2    carvedilol (COREG) 25 MG tablet Take 1 tablet (25 mg total) by mouth 2 (two) times daily with a meal. 180 tablet 3   diphenhydramine-acetaminophen (TYLENOL PM) 25-500 MG TABS tablet Take 1 tablet by mouth at bedtime.     ferrous sulfate 325 (65 FE) MG tablet Take 1 tablet (325 mg total) by mouth every morning. 90 tablet 2   furosemide (LASIX) 40 MG tablet Take 1 tablet (40 mg total) by mouth daily. 90 tablet 3   lisinopril (ZESTRIL) 40 MG tablet Take 1 tablet (40 mg total) by mouth daily. 90 tablet 3   nitroGLYCERIN (NITROSTAT) 0.4 MG SL tablet Place 1 tablet (0.4 mg total) under the tongue every 5 (five) minutes as needed for chest pain. X 3 doses 25 tablet 1   pantoprazole (PROTONIX) 40 MG tablet Take 1 tablet (40 mg total) by mouth daily. 90 tablet 3   polyvinyl alcohol (LIQUIFILM TEARS) 1.4 % ophthalmic solution Place 1 drop into the right eye daily.     potassium chloride SA (KLOR-CON) 20 MEQ tablet Take 1 tablet (20 mEq total) by mouth daily. 90 tablet 3   XARELTO 20 MG TABS tablet Take 1 tablet (20 mg total) by mouth daily. 90 tablet 3   Facility-Administered Medications Prior to Visit  Medication Dose Route Frequency Provider Last Rate Last Admin   triamcinolone acetonide (KENALOG) 10 MG/ML injection 10 mg  10 mg Other Once Regal, Norman S, DPM       triamcinolone acetonide (KENALOG) 10 MG/ML injection 10 mg  10 mg Other Once [place] Huh, DPM         Allergies:   Patient has no known allergies.   Social History   Socioeconomic History   Marital status: Married    Spouse name: Not on file   Number of children: Not on file   Years of education: Not on file   Highest education level: Not on file  Occupational History   Not on file  Tobacco Use   Smoking status: Never   Smokeless tobacco: Never  Vaping Use   Vaping Use: Never used  Substance and Sexual Activity   Alcohol use: No    Comment: 2 beers a year   Drug use: No   Sexual activity: Not on file  Other Topics  Concern   Not on file  Social History Narrative   Not on file   Social Determinants of Health   Financial Resource Strain: Not on file  Food Insecurity: Not on file  Transportation Needs: Not on file  Physical Activity: Not on file  Stress: Not on file  Social Connections: Not on file     Family History:  The patient's family history includes Cancer in an other family member; Congestive Heart Failure (age of onset: 60) in his father; Heart attack (age of onset: 64) in his father.   ROS General: Negative; No fevers, chills, or night sweats;  HEENT: Negative; No changes in vision or hearing, sinus congestion, difficulty swallowing Pulmonary: Negative; No cough, wheezing, shortness of breath, hemoptysis Cardiovascular: History of atrial fibrillation, status post ablation, CAD, no recent chest pain GI: Positive for GERD GU: Negative; No dysuria, hematuria, or difficulty voiding Musculoskeletal: Negative; no myalgias, joint pain, or weakness Hematologic/Oncology: Negative; no easy bruising, bleeding Endocrine: Negative; no heat/cold intolerance; no diabetes Neuro: Negative; no changes in balance, headaches Skin: Negative; No rashes or skin lesions Psychiatric:  Negative; No behavioral problems, depression Sleep: see HPI Other comprehensive 14 point system review is negative.   PHYSICAL EXAM:   VS:  BP 120/76 (BP Location: Left Arm)   Pulse 69   Ht _0  (1.727 m)   Wt 199 lb 3.2 oz (90.4 kg)   SpO2 96%   BMI 30.29 kg/m     Repeat blood pressure by me was 130/78  Wt Readings from Last 3 Encounters:  07/12/21 199 lb 3.2 oz (90.4 kg)  11/20/20 209 lb 6.4 oz (95 kg)  03/29/20 204 lb (92.5 kg)    General: Alert, oriented, no distress.  Skin: normal turgor, no rashes, warm and dry HEENT: Normocephalic, atraumatic. Pupils equal round and reactive to light; sclera anicteric; extraocular muscles intact;  Nose without nasal septal hypertrophy Mouth/Parynx benign; Mallinpatti  scale 3/4 Neck: No JVD, no carotid bruits; normal carotid upstroke Lungs: clear to ausculatation and percussion; no wheezing or rales Chest wall: without tenderness to palpitation Heart: PMI not displaced, RRR, s1 s2 normal, 1/6 systolic murmur, no diastolic murmur, no rubs, gallops, thrills, or heaves Abdomen: soft, nontender; no hepatosplenomehaly, BS+; abdominal aorta nontender and not dilated by palpation. Back: no CVA tenderness Pulses 2+ Musculoskeletal: full range of motion, normal strength, no joint deformities Extremities: no clubbing cyanosis or edema, Homan's sign negative  Neurologic: grossly nonfocal; Cranial nerves grossly wnl Psychologic: Normal mood and affect    Studies/Labs Reviewed:   EKG:  EKG is ordered today.  ECG (independently read by me): Normal sinus rhythm at 69 bpm with several isolated PVCs.  QTc interval 439 ms.  July 2020 ECG (independently read by me): NSR at 63; Nondiagnostic T changes  April 2018 ECG (independently read by me): Sinus rhythm at 63 bpm.  Left affix deviation.  Small Q waves in 1 and aVL.  Nondiagnostic T-wave changes V2 and V3.  Recent Labs: BMP Latest Ref Rng & Units 03/03/2018 03/02/2018 02/28/2018  Glucose 65 - 99 mg/dL 87 94 102(H)  BUN 6 - 20 mg/dL 14 20 21(H)  Creatinine 0.61 - 1.24 mg/dL 1.19 1.38(H) 1.21  Sodium 135 - 145 mmol/L 144 144 143  Potassium 3.5 - 5.1 mmol/L 3.7 4.2 4.6  Chloride 101 - 111 mmol/L 111 110 112(H)  CO2 22 - 32 mmol/L 23 22 21(L)  Calcium 8.9 - 10.3 mg/dL 9.1 9.0 10.0     Hepatic Function Latest Ref Rng & Units 02/28/2018 11/13/2017 07/17/2016  Total Protein 6.5 - 8.1 g/dL 7.7 6.7 6.6  Albumin 3.5 - 5.0 g/dL 3.9 3.9 3.7  AST 15 - 41 U/L _1 ALT 17 - 63 U/L 15(L) 20 22  Alk Phosphatase 38 - 126 U/L 81 71 38  Total Bilirubin 0.3 - 1.2 mg/dL 1.2 1.0 0.6    CBC Latest Ref Rng & Units 03/03/2018 03/02/2018 02/28/2018  WBC 4.0 - 10.5 K/uL 6.4 8.0 11.8(H)  Hemoglobin 13.0 - 17.0 g/dL 12.1(L) 12.2(L)  14.5  Hematocrit 39.0 - 52.0 % 38.1(L) 38.2(L) 43.6  Platelets 150 - 400 K/uL 139(L) 150 167   Lab Results  Component Value Date   MCV 96.5 03/03/2018   MCV 98.7 03/02/2018   MCV 96.5 02/28/2018   Lab Results  Component Value Date   TSH 0.871 08/03/2016   No results found for: HGBA1C   BNP    Component Value Date/Time   BNP 414.8 (H) 08/03/2016 1103    ProBNP    Component Value Date/Time   PROBNP 144.7 (H) 02/03/2012 1854  Lipid Panel     Component Value Date/Time   CHOL 140 01/01/2013 1037   TRIG 230.0 (H) 01/01/2013 1037   HDL 38.20 (L) 01/01/2013 1037   CHOLHDL 4 01/01/2013 1037   VLDL 46.0 (H) 01/01/2013 1037   LDLDIRECT 68.6 01/01/2013 1037     RADIOLOGY: No results found.   Additional studies/ records that were reviewed today include:  I reviewed the patient's office records, atrial fibrillation clinic, split-night study, [employer] information, and download.    ASSESSMENT:    1. Essential hypertension   2. OSA (obstructive sleep apnea)   3. Paroxysmal atrial fibrillation (HCC)   4. PVC's (premature ventricular contractions)   5. Hyperlipidemia with target LDL less than 70   6. Anticoagulation adequate     PLAN:  Mr. Crecencio Kwiatek is a 75 year old male who has a history  CAD, status post PCI to his LAD, and atrial fibrillation for which he underwent successful ablation in December 2018.  Prior to his ablation procedure, he had undergone a sleep study, which verified very severe sleep apnea with an AHI of 84.2 and markedly reduced oxygen desaturation to 63% with time spent below 88% saturation at 53.9 minutes.  When I saw him in April 2018 following institution of BiPAP therapy, he was feeling significantly improved and was meeting Medicare compliance standards.  He had excellent response to BiPAP with an AHI of 0.7.  Since March 2019, he has lost approximately 45 pounds in 6 inches from his waist with waist size being reduced from 42 down to 36.   Over the last several years, I have made adjustments to his BiPAP settings and most recently he is on a ramp time of 25 with start EPAP pressure at 10.  Minimum EPAP setting is 12 with maximum IPAP setting of 25.  Over the past year, he is continue to use BiPAP with 100% compliance.  He is averaging, however only 6 hours and 37 minutes of use per night despite being in bed for longer duration.  He has been taking his mask off when he returns from the bathroom at about 4:56 AM.  I discussed with him that in the past he had very severe sleep apnea with significant oxygen desaturation to a nadir of 63 and on his diagnostics portion of his split-night evaluation he was unable to achieve any REM sleep.  We discussed that from 4 to 7 AM there is still high likelihood that he will obtain rem sleep and as result he is subjecting himself to potential nocturnal hypoxia if he is not continuing to use his mask.  His download is showing a 95th percentile pressure of 19.4/15.4 with maximum average pressure 21.7/17.7 cm of water.  His download through August shows significant mask leak.  I have the need for new mask and have sent an order to [employer] for a new ResMed AirFit F 30i medium size mask.  We discussed the importance of baths cleansing.  He will plan to use therapy for the nights duration.  His blood pressure today is stable on his medical regimen consisting of carvedilol 25 mg twice a day, lisinopril 40 mg in addition to furosemide 40 mg daily.  There is no edema.  He does have an occasional PVC on ECG, isolated.  He continues to be on Xarelto 20 mg.  He is on atorvastatin 40 mg for hyperlipidemia.  Target LDL is less than 70.  He is followed by [name] and Dr. [name].  From a  sleep perspective I will see him in 1 year for reevaluation or sooner as needed.     Medication Adjustments/Labs and Tests Ordered: Current medicines are reviewed at length with the patient today.  Concerns regarding medicines are outlined  above.  Medication changes, Labs and Tests ordered today are listed in the Patient Instructions below.  Patient Instructions  Medication Instructions:  The current medical regimen is effective;  continue present plan and medications.  *If you need a refill on your cardiac medications before your next appointment, please call your pharmacy*   Follow-Up: At [facility], you and your health needs are our priority.  As part of our continuing mission to provide you with exceptional heart care, we have created designated Provider Care Teams.  These Care Teams include your primary Cardiologist (physician) and Advanced Practice Providers (APPs -  Physician Assistants and Nurse Practitioners) who all work together to provide you with the care you need, when you need it.  We recommend signing up for the patient portal called "MyChart".  Sign up information is provided on this After Visit Summary.  MyChart is used to connect with patients for Virtual Visits (Telemedicine).  Patients are able to view lab/test results, encounter notes, upcoming appointments, etc.  Non-urgent messages can be sent to your provider as well.   To learn more about what you can do with MyChart, go to [email or web address].    Your next appointment:   12 month(s)  The format for your next appointment:   In Person  Provider:   [name], MD     Signed, [name], MD  07/12/2021 3:14 PM    [facility] [address] [place], [place], [place]  [address] Phone: [phone]

## 2021-07-13 ENCOUNTER — Other Ambulatory Visit: Payer: Self-pay

## 2021-08-09 DIAGNOSIS — D485 Neoplasm of uncertain behavior of skin: Secondary | ICD-10-CM | POA: Diagnosis not present

## 2021-08-09 DIAGNOSIS — C44612 Basal cell carcinoma of skin of right upper limb, including shoulder: Secondary | ICD-10-CM | POA: Diagnosis not present

## 2021-08-09 DIAGNOSIS — D0461 Carcinoma in situ of skin of right upper limb, including shoulder: Secondary | ICD-10-CM | POA: Diagnosis not present

## 2021-08-09 DIAGNOSIS — C44619 Basal cell carcinoma of skin of left upper limb, including shoulder: Secondary | ICD-10-CM | POA: Diagnosis not present

## 2021-08-09 DIAGNOSIS — L570 Actinic keratosis: Secondary | ICD-10-CM | POA: Diagnosis not present

## 2021-08-09 DIAGNOSIS — Z85828 Personal history of other malignant neoplasm of skin: Secondary | ICD-10-CM | POA: Diagnosis not present

## 2021-08-17 DIAGNOSIS — N183 Chronic kidney disease, stage 3 unspecified: Secondary | ICD-10-CM | POA: Diagnosis not present

## 2021-09-06 ENCOUNTER — Telehealth: Payer: Self-pay

## 2021-09-06 NOTE — Telephone Encounter (Signed)
[place]

## 2021-09-06 NOTE — Telephone Encounter (Signed)
Pt called stating that he needed an apt with Dr. Junius Roads for an injection in his hip. Since hilts is no longer in the office who do you want this patient to see regarding this injection

## 2021-09-07 ENCOUNTER — Other Ambulatory Visit: Payer: Self-pay

## 2021-09-07 DIAGNOSIS — M1611 Unilateral primary osteoarthritis, right hip: Secondary | ICD-10-CM

## 2021-09-07 NOTE — Telephone Encounter (Signed)
Order made

## 2021-09-07 NOTE — Telephone Encounter (Signed)
Called patient no answer. LMOM. Order placed

## 2021-09-11 ENCOUNTER — Telehealth: Payer: Self-pay | Admitting: Physical Medicine and Rehabilitation

## 2021-09-11 NOTE — Telephone Encounter (Signed)
Pt called stating he missed a call from [facility] and would like to be tried again. He states he's off for the rest of the afternoon.   [phone]

## 2021-09-12 ENCOUNTER — Telehealth: Payer: Self-pay | Admitting: Physical Medicine and Rehabilitation

## 2021-09-12 NOTE — Telephone Encounter (Signed)
Pt called again. Would like call back   [phone]

## 2021-09-12 NOTE — Telephone Encounter (Signed)
Pt called stating he missed a call because he was in a meeting but he's off for the rest of the afternoon and would like to be tried again.   [phone]

## 2021-09-29 ENCOUNTER — Other Ambulatory Visit: Payer: Self-pay | Admitting: Cardiology

## 2021-10-01 ENCOUNTER — Other Ambulatory Visit: Payer: Self-pay

## 2021-10-01 ENCOUNTER — Ambulatory Visit: Payer: Medicare HMO | Admitting: Physical Medicine and Rehabilitation

## 2021-10-01 ENCOUNTER — Encounter: Payer: Self-pay | Admitting: Physical Medicine and Rehabilitation

## 2021-10-01 ENCOUNTER — Ambulatory Visit: Payer: Self-pay

## 2021-10-01 DIAGNOSIS — M25551 Pain in right hip: Secondary | ICD-10-CM | POA: Diagnosis not present

## 2021-10-01 NOTE — Progress Notes (Signed)
Pt state right hip pain. Pt state any movement makes the pain worse. Pt state he takes pain meds to help ease his pain.  Numeric Pain Rating Scale and Functional Assessment Average Pain 7   In the last MONTH (on 0-10 scale) has pain interfered with the following?  1. General activity like being  able to carry out your everyday physical activities such as walking, climbing stairs, carrying groceries, or moving a chair?  Rating(10)    -BT, -Dye Allergies.

## 2021-10-02 MED ORDER — TRIAMCINOLONE ACETONIDE 40 MG/ML IJ SUSP
60.0000 mg | INTRAMUSCULAR | Status: AC | PRN
  Administered 2021-10-01: 60 mg via INTRA_ARTICULAR

## 2021-10-02 MED ORDER — BUPIVACAINE HCL 0.25 % IJ SOLN
4.0000 mL | INTRAMUSCULAR | Status: AC | PRN
  Administered 2021-10-01: 4 mL via INTRA_ARTICULAR

## 2021-10-02 NOTE — Progress Notes (Signed)
   Peter [name] - 75 y.o. male MRN [id number]  Date of birth: 06/18/1946  Office Visit Note: Visit Date: 10/01/2021 PCP: [name], MD Referred by: [name], MD  Subjective: Chief Complaint  Patient presents with   Right Hip - Pain   HPI:  Peter [name] is a 75 y.o. male who comes in today at the request of Dr. Eduard Roux for planned Right anesthetic hip arthrogram with fluoroscopic guidance.  The patient has failed conservative care including home exercise, medications, time and activity modification.  This injection will be diagnostic and hopefully therapeutic.  Please see requesting physician notes for further details and justification.  ROS Otherwise per HPI.  Assessment & Plan: Visit Diagnoses:    ICD-10-CM   1. Pain in right hip  M25.551 XR C-ARM NO REPORT      Plan: No additional findings.   Meds & Orders: No orders of the defined types were placed in this encounter.   Orders Placed This Encounter  Procedures   Large Joint Inj   XR C-ARM NO REPORT    Follow-up: Return for visit to requesting provider as needed.   Procedures: Large Joint Inj: R hip joint on 10/01/2021 3:15 PM Indications: diagnostic evaluation and pain Details: 22 G 3.5 in needle, fluoroscopy-guided anterior approach  Arthrogram: No  Medications: 4 mL bupivacaine 0.25 %; 60 mg triamcinolone acetonide 40 MG/ML Outcome: tolerated well, no immediate complications  There was excellent flow of contrast producing a partial arthrogram of the hip. The patient did have relief of symptoms during the anesthetic phase of the injection. Procedure, treatment alternatives, risks and benefits explained, specific risks discussed. Consent was given by the patient. Immediately prior to procedure a time out was called to verify the correct patient, procedure, equipment, support staff and site/side marked as required. Patient was prepped and draped in the usual sterile fashion.         Clinical  History: No specialty comments available.     Objective:  VS:  HT:    WT:   BMI:     BP:   HR: bpm  TEMP: ( )  RESP:  Physical Exam   Imaging: XR C-ARM NO REPORT  Result Date: 10/01/2021 Please see Notes tab for imaging impression.

## 2021-10-08 ENCOUNTER — Other Ambulatory Visit: Payer: Self-pay | Admitting: Cardiology

## 2021-11-01 ENCOUNTER — Other Ambulatory Visit: Payer: Self-pay | Admitting: Cardiology

## 2021-11-01 DIAGNOSIS — I480 Paroxysmal atrial fibrillation: Secondary | ICD-10-CM

## 2021-11-01 NOTE — Telephone Encounter (Signed)
Prescription refill request for Xarelto received.  Indication:Afib Last office visit:9/22 Weight:90.4 kg Age:75 Scr:1.4 CrCl:57.4 ml/min  Prescription refilled

## 2021-12-06 ENCOUNTER — Other Ambulatory Visit: Payer: Self-pay

## 2021-12-06 ENCOUNTER — Encounter: Payer: Self-pay | Admitting: Cardiology

## 2021-12-06 ENCOUNTER — Ambulatory Visit: Payer: Medicare HMO | Admitting: Cardiology

## 2021-12-06 VITALS — BP 136/74 | HR 78 | Ht 68.0 in | Wt 219.0 lb

## 2021-12-06 DIAGNOSIS — I509 Heart failure, unspecified: Secondary | ICD-10-CM | POA: Diagnosis not present

## 2021-12-06 DIAGNOSIS — I4819 Other persistent atrial fibrillation: Secondary | ICD-10-CM

## 2021-12-06 DIAGNOSIS — I130 Hypertensive heart and chronic kidney disease with heart failure and stage 1 through stage 4 chronic kidney disease, or unspecified chronic kidney disease: Secondary | ICD-10-CM | POA: Diagnosis not present

## 2021-12-06 NOTE — Patient Instructions (Signed)
Medication Instructions:  Your physician recommends that you continue on your current medications as directed. Please refer to the Current Medication list given to you today.  *If you need a refill on your cardiac medications before your next appointment, please call your pharmacy*   Lab Work: None ordered If you have labs (blood work) drawn today and your tests are completely normal, you will receive your results only by: [place] (if you have MyChart) OR A paper copy in the mail If you have any lab test that is abnormal or we need to change your treatment, we will call you to review the results.   Testing/Procedures: None ordered   Follow-Up: At [facility], you and your health needs are our priority.  As part of our continuing mission to provide you with exceptional heart care, we have created designated Provider Care Teams.  These Care Teams include your primary Cardiologist (physician) and Advanced Practice Providers (APPs -  Physician Assistants and Nurse Practitioners) who all work together to provide you with the care you need, when you need it.  Your next appointment:   1 year(s)  The format for your next appointment:   In Person  Provider:   Allegra Lai, MD    Thank you for choosing [facility]!!   Trinidad Curet, RN [phone]   Other Instructions

## 2021-12-06 NOTE — Progress Notes (Signed)
Electrophysiology Office Note   Date:  12/06/2021   ID:  Peter [name], DOB May 30, 1946, MRN [id number]  PCP:  [name], MD  Cardiologist:  Marlou Porch Primary Electrophysiologist:  Constance Haw, MD    No chief complaint on file.    History of Present Illness: Peter [name] is a 76 y.o. male who presents today for electrophysiology evaluation.     He has a history significant coronary artery disease status post PCI x3 to the LAD in 0347, chronic diastolic heart failure, hyperlipidemia, GERD, GI bleeding, hypertension, obesity, CKD stage III, atrial fibrillation.  He is status post atrial fibrillation ablation 10/24/2016.  Today, denies symptoms of palpitations, chest pain, shortness of breath, orthopnea, PND, lower extremity edema, claudication, dizziness, presyncope, syncope, bleeding, or neurologic sequela. The patient is tolerating medications without difficulties.  He has been feeling well.  He has no chest pain or shortness of breath.  He is able to do all of his daily activities.  He is overall happy with his control.  He has noted no further episodes of atrial fibrillation.  Past Medical History:  Diagnosis Date   At risk for sleep apnea    STOP-BANG= 5       SENT TO PCP 07-12-2015   CAD (coronary artery disease)    a. s/p PCI with DES x 3 to LAD in 2006 with bifurcation lesion and Plavix indefinitely was recommended.   Chronic combined systolic and diastolic CHF (congestive heart failure) ([place])    a. Prior EF normal but EF dropped to 30-35% in 07/2016 in setting of AFIB.   CKD (chronic kidney disease), stage III (HCC)    Dyslipidemia    Dyspnea    tired quick d/t Afib   Dyspnea on exertion    GERD (gastroesophageal reflux disease)    History of GI bleed    History of kidney stones    History of MI (myocardial infarction)    11-05-2005--  periprocedural MI  (cardiac cath) per dr [name] note   Hypertension    Hypertriglyceridemia    Iron deficiency anemia     Myocardial infarction [facility])    Persistent atrial fibrillation ([place])    a. 07/2016, started on Eliquis -> readmitted later that month for AF RVR/acute HF - initially unsuccessful DCCV following TEE, so loaded with amio with repeat successful DCCV 07/2816.   Right ureteral stone    S/P drug eluting coronary stent placement    x3  to LAD  2006   Sleep apnea    wears BIPAP, 14 up to 24   Past Surgical History:  Procedure Laterality Date   CARDIOVASCULAR STRESS TEST  01-06-2013  dr [name]   normal perfusion study/  no ischemia or infarct/  normal LV function and wall motion , ef 57%   CARDIOVERSION N/A 08/05/2016   Procedure: CARDIOVERSION;  Surgeon: Sanda Klein, MD;  Location: [place];  Service: Cardiovascular;  Laterality: N/A;   CARDIOVERSION N/A 08/08/2016   Procedure: CARDIOVERSION;  Surgeon: Jerline Pain, MD;  Location: [place];  Service: Cardiovascular;  Laterality: N/A;   CORONARY ANGIOPLASTY  11-13-2005 dr [name]   Successful touch up post-dilatation of the 3 tandem overlying Cypher stents in proximal to mid LAD (based on IVUS 0% stenosis and patent diagonal branch)/  Cutting Balloon Angioplasty of Ramus branch   CORONARY ANGIOPLASTY WITH STENT PLACEMENT  11-05-2005   dr [name]   PCI and DES x3 to birfurcation lesion LAD and diagonal branch of LAD/  20% dLM,  30-40% RCA, ef 60%   CYSTOSCOPY WITH RETROGRADE PYELOGRAM, URETEROSCOPY AND STENT PLACEMENT Right 07/14/2015   Procedure: CYSTOSCOPY WITH RETROGRADE PYELOGRAM, URETEROSCOPY AND STENT PLACEMENT;  Surgeon: Alexis Frock, MD;  Location: [facility];  Service: Urology;  Laterality: Right;   ELECTROPHYSIOLOGIC STUDY N/A 10/24/2016   Procedure: Atrial Fibrillation Ablation;  Surgeon: Mackenzye Mackel Meredith Leeds, MD;  Location: [place] [facility];  Service: Cardiovascular;  Laterality: N/A;   EXTRACORPOREAL SHOCK WAVE LITHOTRIPSY  yrs ago   HEMORRHOID SURGERY N/A 10/24/2017   Procedure: HEMORRHOIDECTOMY;   Surgeon: Ileana Roup, MD;  Location: [place] [facility];  Service: General;  Laterality: N/A;   IR GENERIC HISTORICAL  10/23/2016   IR FLUORO GUIDE CV LINE RIGHT 10/23/2016 Greggory Keen, MD [facility]   IR GENERIC HISTORICAL  10/23/2016   IR US GUIDE VASC ACCESS RIGHT 10/23/2016 Greggory Keen, MD [facility]   LEFT URETEROSCOPIC STONE EXTRACTION  04-10-2006   STONE EXTRACTION WITH BASKET Right 07/14/2015   Procedure: STONE EXTRACTION WITH BASKET;  Surgeon: Alexis Frock, MD;  Location: [facility];  Service: Urology;  Laterality: Right;   TEE WITHOUT CARDIOVERSION N/A 08/05/2016   Procedure: TRANSESOPHAGEAL ECHOCARDIOGRAM (TEE);  Surgeon: Sanda Klein, MD;  Location: [facility] ENDOSCOPY;  Service: Cardiovascular;  Laterality: N/A;   TRANSTHORACIC ECHOCARDIOGRAM  02-04-2012   grade I diastolic dysfunction/  ef 55-60%/  trivial MR and TR/  mild LAE     Current Outpatient Medications  Medication Sig Dispense Refill   allopurinol (ZYLOPRIM) 100 MG tablet Take 100 mg by mouth at bedtime.     allopurinol (ZYLOPRIM) 300 MG tablet Take 300 mg by mouth daily.     atorvastatin (LIPITOR) 40 MG tablet Take 1 tablet (40 mg total) by mouth every morning. Please keep upcoming appt in January 2023 with Dr. [name] before anymore refills. Thank you 90 tablet 2   carvedilol (COREG) 25 MG tablet TAKE 1 TABLET TWICE DAILY WITH MEALS 180 tablet 3   diphenhydramine-acetaminophen (TYLENOL PM) 25-500 MG TABS tablet Take 1 tablet by mouth at bedtime.     ferrous sulfate 325 (65 FE) MG tablet Take 1 tablet (325 mg total) by mouth every morning. 90 tablet 2   furosemide (LASIX) 40 MG tablet TAKE 1 TABLET EVERY DAY 90 tablet 3   lisinopril (ZESTRIL) 40 MG tablet Take 1 tablet (40 mg total) by mouth daily. Please keep upcoming appt. With Dr. [name] in Jan. In order to receive future refills. Thank You> 90 tablet 0   nitroGLYCERIN (NITROSTAT) 0.4 MG SL tablet Place 1 tablet (0.4 mg total) under the tongue  every 5 (five) minutes as needed for chest pain. X 3 doses 25 tablet 1   pantoprazole (PROTONIX) 40 MG tablet TAKE 1 TABLET EVERY DAY 90 tablet 3   polyvinyl alcohol (LIQUIFILM TEARS) 1.4 % ophthalmic solution Place 1 drop into the right eye daily.     potassium chloride SA (KLOR-CON M) 20 MEQ tablet TAKE 1 TABLET EVERY DAY 90 tablet 3   XARELTO 20 MG TABS tablet TAKE 1 TABLET EVERY DAY 90 tablet 3   Current Facility-Administered Medications  Medication Dose Route Frequency Provider Last Rate Last Admin   triamcinolone acetonide (KENALOG) 10 MG/ML injection 10 mg  10 mg Other Once Regal, Norman S, DPM       triamcinolone acetonide (KENALOG) 10 MG/ML injection 10 mg  10 mg Other Once Wallene Huh, DPM        Allergies:  Patient has no known allergies.   Social History:  The patient  reports that he has never smoked. He has never used smokeless tobacco. He reports that he does not drink alcohol and does not use drugs.   Family History:  The patient's family history includes Cancer in an other family member; Congestive Heart Failure (age of onset: 76) in his father; Heart attack (age of onset: 75) in his father.   ROS:  Please see the history of present illness.   Otherwise, review of systems is positive for none.   All other systems are reviewed and negative.   PHYSICAL EXAM: VS:  BP 136/74    Pulse 78    Ht 5\' 8"  (1.727 m)    Wt 219 lb (99.3 kg)    SpO2 94%    BMI 33.30 kg/m  , BMI Body mass index is 33.3 kg/m. GEN: Well nourished, well developed, in no acute distress  HEENT: normal  Neck: no JVD, carotid bruits, or masses Cardiac: RRR; no murmurs, rubs, or gallops,no edema  Respiratory:  clear to auscultation bilaterally, normal work of breathing GI: soft, nontender, nondistended, + BS MS: no deformity or atrophy  Skin: warm and dry Neuro:  Strength and sensation are intact Psych: euthymic mood, full affect  EKG:  EKG is not ordered today. Personal review of the ekg ordered  07/12/21 shows sinus rhythm, PVCs, rate 69  Recent Labs: No results found for requested labs within last 8760 hours.    Lipid Panel     Component Value Date/Time   CHOL 140 01/01/2013 1037   TRIG 230.0 (H) 01/01/2013 1037   HDL 38.20 (L) 01/01/2013 1037   CHOLHDL 4 01/01/2013 1037   VLDL 46.0 (H) 01/01/2013 1037   LDLDIRECT 68.6 01/01/2013 1037     Wt Readings from Last 3 Encounters:  12/06/21 219 lb (99.3 kg)  07/12/21 199 lb 3.2 oz (90.4 kg)  11/20/20 209 lb 6.4 oz (95 kg)      Other studies Reviewed: Additional studies/ records that were reviewed today include: TEE 11/22/16 Review of the above records today demonstrates:  - Left ventricle: The cavity size was normal. Wall thickness was   increased in a pattern of mild LVH. Systolic function was normal.   The estimated ejection fraction was in the range of 55% to 60%.   Left ventricular diastolic function parameters were normal. - Mitral valve: Calcified annulus. Mildly thickened leaflets . - Left atrium: The atrium was mildly dilated. - Atrial septum: No defect or patent foramen ovale was identified.   ASSESSMENT AND PLAN:  1.  Persistent atrial fibrillation/flutter: Status post ablation 10/24/2016.  Currently on Xarelto 20 mg daily, carvedilol 25 mg twice daily.  CHA2DS2-VASc of 4.  He is remained in sinus rhythm without episodes of atrial fibrillation.  He is happy with his control.  No changes.  2.  Likely nonischemic cardiomyopathy: Ejection fraction is fortunately improved.  No changes at this time.  3.  Hypertension: Currently well controlled.  Continue carvedilol 25 mg twice daily, lisinopril 40 mg daily.  4.  Coronary artery disease: Status post PCI to the LAD in 2006.  Currently asymptomatic.  No changes.  5.  Obstructive sleep apnea: BiPAP compliance encouraged  Current medicines are reviewed at length with the patient today.   The patient does not have concerns regarding his medicines.  The following  changes were made today: None  Labs/ tests ordered today include:  No orders of the defined types were  placed in this encounter.    Disposition:   FU with Tildon Silveria 12 months  Signed, Brielyn Bosak Meredith Leeds, MD  12/06/2021 9:00 AM     [facility] [address] [place] [address] [phone] (office) [phone] (fax)

## 2021-12-31 ENCOUNTER — Telehealth: Payer: Self-pay | Admitting: Physical Medicine and Rehabilitation

## 2021-12-31 NOTE — Telephone Encounter (Signed)
Pt called to see if it's ok to set an appt for another injection. Last injection was Nov/22. Please call pt at [phone].

## 2022-01-17 ENCOUNTER — Encounter: Payer: Self-pay | Admitting: Physical Medicine and Rehabilitation

## 2022-01-17 ENCOUNTER — Ambulatory Visit: Payer: Medicare HMO | Admitting: Physical Medicine and Rehabilitation

## 2022-01-17 ENCOUNTER — Ambulatory Visit: Payer: Self-pay

## 2022-01-17 ENCOUNTER — Other Ambulatory Visit: Payer: Self-pay

## 2022-01-17 DIAGNOSIS — M25551 Pain in right hip: Secondary | ICD-10-CM | POA: Diagnosis not present

## 2022-01-17 NOTE — Progress Notes (Signed)
Pt state right hip pain. Pt state walking and turning makes the pain worse. Pt state he takes over the counter pain meds to help ease his pain. ? ?Numeric Pain Rating Scale and Functional Assessment ?Average Pain 4 ? ? ?In the last MONTH (on 0-10 scale) has pain interfered with the following? ? ?1. General activity like being  able to carry out your everyday physical activities such as walking, climbing stairs, carrying groceries, or moving a chair?  ?Rating(8) ? ? ? +BT, -Dye Allergies. ? ?

## 2022-01-26 MED ORDER — BUPIVACAINE HCL 0.25 % IJ SOLN
4.0000 mL | INTRAMUSCULAR | Status: AC | PRN
  Administered 2022-01-17: 4 mL via INTRA_ARTICULAR

## 2022-01-26 MED ORDER — TRIAMCINOLONE ACETONIDE 40 MG/ML IJ SUSP
60.0000 mg | INTRAMUSCULAR | Status: AC | PRN
  Administered 2022-01-17: 60 mg via INTRA_ARTICULAR

## 2022-01-26 NOTE — Progress Notes (Signed)
? ?  Peter [name] - 76 y.o. male MRN [id number]  Date of birth: 01-16-1946 ? ?Office Visit Note: ?Visit Date: 01/17/2022 ?PCP: [name], MD ?Referred by: [name], MD ? ?Subjective: ?Chief Complaint  ?Patient presents with  ? Right Hip - Pain  ? ?HPI:  Peter [name] is a 76 y.o. male who comes in today for planned repeat Right anesthetic hip arthrogram with fluoroscopic guidance.  The patient has failed conservative care including home exercise, medications, time and activity modification. Prior injection gave more than 50% relief for several months. This injection will be diagnostic and hopefully therapeutic.  Please see requesting physician notes for further details and justification. ? ?Referring: Dr. Eduard Roux  ? ?ROS Otherwise per HPI. ? ?Assessment & Plan: ?Visit Diagnoses:  ?  ICD-10-CM   ?1. Pain in right hip  M25.551 XR C-ARM NO REPORT  ?  ?  ?Plan: No additional findings.  ? ?Meds & Orders: No orders of the defined types were placed in this encounter. ?  ?Orders Placed This Encounter  ?Procedures  ? Large Joint Inj  ? XR C-ARM NO REPORT  ?  ?Follow-up: No follow-ups on file.  ? ?Procedures: ?Large Joint Inj: R hip joint on 01/17/2022 1:00 PM ?Indications: diagnostic evaluation and pain ?Details: 22 G 3.5 in needle, fluoroscopy-guided anterior approach ? ?Arthrogram: No ? ?Medications: 4 mL bupivacaine 0.25 %; 60 mg triamcinolone acetonide 40 MG/ML ?Outcome: tolerated well, no immediate complications ? ?There was excellent flow of contrast producing a partial arthrogram of the hip. The patient did have relief of symptoms during the anesthetic phase of the injection. ?Procedure, treatment alternatives, risks and benefits explained, specific risks discussed. Consent was given by the patient. Immediately prior to procedure a time out was called to verify the correct patient, procedure, equipment, support staff and site/side marked as required. Patient was prepped and draped in the usual sterile  fashion.  ? ?  ?   ? ?Clinical History: ?No specialty comments available.  ? ? ? ?Objective:  VS:  HT:    WT:   BMI:     BP:   HR: bpm  TEMP: ( )  RESP:  ?Physical Exam  ? ?Imaging: ?No results found. ?

## 2022-02-25 ENCOUNTER — Telehealth: Payer: Self-pay | Admitting: Physical Medicine and Rehabilitation

## 2022-02-25 NOTE — Telephone Encounter (Signed)
Pt asking for a call back from [facility] when possible to discuss previous inj he received. Pt stated his inj only lasted a few days and wanted to know what next steps will be. The best call back number is [phone].  ?

## 2022-03-07 ENCOUNTER — Ambulatory Visit (INDEPENDENT_AMBULATORY_CARE_PROVIDER_SITE_OTHER): Payer: Medicare HMO

## 2022-03-07 ENCOUNTER — Encounter: Payer: Self-pay | Admitting: Orthopaedic Surgery

## 2022-03-07 ENCOUNTER — Ambulatory Visit: Payer: Medicare HMO | Admitting: Orthopaedic Surgery

## 2022-03-07 DIAGNOSIS — G8929 Other chronic pain: Secondary | ICD-10-CM | POA: Diagnosis not present

## 2022-03-07 DIAGNOSIS — M25511 Pain in right shoulder: Secondary | ICD-10-CM

## 2022-03-07 DIAGNOSIS — M1611 Unilateral primary osteoarthritis, right hip: Secondary | ICD-10-CM

## 2022-03-07 DIAGNOSIS — M79641 Pain in right hand: Secondary | ICD-10-CM

## 2022-03-07 MED ORDER — BUPIVACAINE HCL 0.5 % IJ SOLN
3.0000 mL | INTRAMUSCULAR | Status: AC | PRN
  Administered 2022-03-07: 3 mL via INTRA_ARTICULAR

## 2022-03-07 MED ORDER — METHYLPREDNISOLONE ACETATE 40 MG/ML IJ SUSP
40.0000 mg | INTRAMUSCULAR | Status: AC | PRN
  Administered 2022-03-07: 40 mg via INTRA_ARTICULAR

## 2022-03-07 MED ORDER — LIDOCAINE HCL 1 % IJ SOLN
3.0000 mL | INTRAMUSCULAR | Status: AC | PRN
  Administered 2022-03-07: 3 mL

## 2022-03-07 NOTE — Progress Notes (Signed)
? ?Office Visit Note ?  ?Patient: Peter [name]           ?Date of Birth: [birth year]           ?MRN: [id number] ?Visit Date: 03/07/2022 ?             ?Requested by: [name], MD ?[address] ?[address] ?[place],  [place] [address] ?PCP: [name], MD ? ? ?Assessment & Plan: ?Visit Diagnoses:  ?1. Primary osteoarthritis of right hip   ?2. Chronic right shoulder pain   ?3. Pain of right hand   ? ? ?Plan: For the right hip he will think about his options and let us know how he wants to proceed.  For now he will just manage with Advil.  For the shoulder he requested a subacromial injection which she tolerated well today.  Exercises provided as well.  For the thumb we will place him in a supportive orthotic to use during activity and hopefully this will help him be able to play guitar.  We will see him back as needed. ? ?Follow-Up Instructions: No follow-ups on file.  ? ?Orders:  ?Orders Placed This Encounter  ?Procedures  ? XR HIP UNILAT W OR W/O PELVIS 2-3 VIEWS RIGHT  ? XR Shoulder Right  ? XR Hand Complete Right  ? ?No orders of the defined types were placed in this encounter. ? ? ? ? Procedures: ?Large Joint Inj: R subacromial bursa on 03/07/2022 9:40 AM ?Indications: pain ?Details: 22 G needle, anterolateral approach ? ?Arthrogram: No ? ?Medications: 3 mL lidocaine 1 %; 3 mL bupivacaine 0.5 %; 40 mg methylPREDNISolone acetate 40 MG/ML ?Outcome: tolerated well, no immediate complications ?Consent was given by the patient. Patient was prepped and draped in the usual sterile fashion.  ? ? ? ? ?Clinical Data: ?No additional findings. ? ? ?Subjective: ?Chief Complaint  ?Patient presents with  ? Right Hip - Pain  ? Right Shoulder - Pain  ? Right Thumb - Pain  ? ? ?HPI ? ?Mr. Common comes in today for evaluation of right hip OA, right shoulder pain, right thumb pain.  The right shoulder and right thumb pain are separate and new problems that we are looking at today.  Underwent hip injection by Dr. Ernestina Patches  on 01/17/2022.  Helped for about 10 days.  Continues to have trouble working part-time at [employer].  Has start up pain and stiffness.  Also reporting pain in the right shoulder for months with limited range of motion.  Right-hand-dominant.  Pain with elevation of the arm above the head.  Takes ibuprofen for the pain occasionally.  Denies any radicular symptoms.  Right thumb hurts at the base and preventing him from playing guitar.  Denies any injuries.  Has constant pain to the thumb with activity. ? ?Review of Systems  ?Constitutional: Negative.   ?All other systems reviewed and are negative. ? ? ?Objective: ?Vital Signs: There were no vitals taken for this visit. ? ?Physical Exam ?Vitals and nursing note reviewed.  ?Constitutional:   ?   Appearance: He is well-developed.  ?Pulmonary:  ?   Effort: Pulmonary effort is normal.  ?Abdominal:  ?   Palpations: Abdomen is soft.  ?Skin: ?   General: Skin is warm.  ?Neurological:  ?   Mental Status: He is alert and oriented to person, place, and time.  ?Psychiatric:     ?   Behavior: Behavior normal.     ?   Thought Content: Thought  content normal.     ?   Judgment: Judgment normal.  ? ? ?Ortho Exam ? ?Examination of right hip is unchanged. ?Examination of right shoulder shows pain with testing of the superior rotator cuff and impingement and Speed test.  Range of motion is mildly limited secondary to pain and guarding.  Cervical spine exam is unremarkable. ?Examination of the right thumb shows pain and crepitus with grind test.  Negative Finkelstein's.  Moderate limitation in thumb opposition. ? ?Specialty Comments:  ?No specialty comments available. ? ?Imaging: ?No results found. ? ? ?PMFS History: ?Patient Active Problem List  ? Diagnosis Date Noted  ? 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  ? Chronic right shoulder pain 03/07/2022  ? Pain of right hand 03/07/2022  ? Diverticulitis 02/28/2018  ? RLQ abdominal pain   ? Sigmoid diverticulitis   ? Acute  blood loss anemia 11/13/2017  ? Anticoagulation adequate 11/12/2016  ? Epistaxis, recurrent 11/12/2016  ? AF (atrial fibrillation) ([place]) 10/24/2016  ? Obesity 08/09/2016  ? Atrial fibrillation with RVR ([place]) 08/03/2016  ? New onset a-fib ([place]) 07/16/2016  ? Persistent atrial fibrillation ([place]) 07/16/2016  ? Acute systolic (congestive) heart failure (HCC)   ? Ejection fraction   ? Iron deficiency anemia due to chronic blood loss 02/04/2012  ? Anemia 02/03/2012  ? CKD (chronic kidney disease), stage III ([place]) 02/03/2012  ? DOE (dyspnea on exertion) 01/28/2012  ? Hypertension   ? Coronary artery disease due to lipid rich plaque   ? Dyslipidemia   ? GERD (gastroesophageal reflux disease)   ? ?Past Medical History:  ?Diagnosis Date  ? At risk for sleep apnea   ? STOP-BANG= 5       SENT TO PCP 07-12-2015  ? CAD (coronary artery disease)   ? a. s/p PCI with DES x 3 to LAD in 2006 with bifurcation lesion and Plavix indefinitely was recommended.  ? Chronic combined systolic and diastolic CHF (congestive heart failure) ([place])   ? a. Prior EF normal but EF dropped to 30-35% in 07/2016 in setting of AFIB.  ? CKD (chronic kidney disease), stage III ([place])   ? Dyslipidemia   ? Dyspnea   ? tired quick d/t Afib  ? Dyspnea on exertion   ? GERD (gastroesophageal reflux disease)   ? History of GI bleed   ? History of kidney stones   ? History of MI (myocardial infarction)   ? 11-05-2005--  periprocedural MI  (cardiac cath) per dr [name] note  ? Hypertension   ? Hypertriglyceridemia   ? Iron deficiency anemia   ? Myocardial infarction [facility])   ? Persistent atrial fibrillation ([place])   ? a. 07/2016, started on Eliquis -> readmitted later that month for AF RVR/acute HF - initially unsuccessful DCCV following TEE, so loaded with amio with repeat successful DCCV 07/2816.  ? Right ureteral stone   ? S/P drug eluting coronary stent placement   ? x3  to LAD  2006  ? Sleep apnea   ? wears BIPAP, 14 up to 24  ?  ?Family History  ?Problem Relation Age  of Onset  ? Heart attack Father 45  ? Congestive Heart Failure Father 27  ? Cancer Other   ?  ?Past Surgical History:  ?Procedure Laterality Date  ? CARDIOVASCULAR STRESS TEST  01-06-2013  dr [name]  ? normal perfusion study/  no ischemia or infarct/  normal LV function and wall motion , ef 57%  ? CARDIOVERSION N/A 08/05/2016  ? Procedure: CARDIOVERSION;  Surgeon: Sanda Klein, MD;  Location: [place];  Service: Cardiovascular;  Laterality: N/A;  ? CARDIOVERSION N/A 08/08/2016  ? Procedure: CARDIOVERSION;  Surgeon: Jerline Pain, MD;  Location: [facility] ENDOSCOPY;  Service: Cardiovascular;  Laterality: N/A;  ? CORONARY ANGIOPLASTY  11-13-2005 dr [name]  ? Successful touch up post-dilatation of the 3 tandem overlying Cypher stents in proximal to mid LAD (based on IVUS 0% stenosis and patent diagonal branch)/  Cutting Balloon Angioplasty of Ramus branch  ? CORONARY ANGIOPLASTY WITH STENT PLACEMENT  11-05-2005   dr [name]  ? PCI and DES x3 to birfurcation lesion LAD and diagonal branch of LAD/   20% dLM,  30-40% RCA, ef 60%  ? CYSTOSCOPY WITH RETROGRADE PYELOGRAM, URETEROSCOPY AND STENT PLACEMENT Right 07/14/2015  ? Procedure: CYSTOSCOPY WITH RETROGRADE PYELOGRAM, URETEROSCOPY AND STENT PLACEMENT;  Surgeon: Alexis Frock, MD;  Location: [facility];  Service: Urology;  Laterality: Right;  ? ELECTROPHYSIOLOGIC STUDY N/A 10/24/2016  ? Procedure: Atrial Fibrillation Ablation;  Surgeon: Will Meredith Leeds, MD;  Location: [place] CV LAB;  Service: Cardiovascular;  Laterality: N/A;  ? EXTRACORPOREAL SHOCK WAVE LITHOTRIPSY  yrs ago  ? HEMORRHOID SURGERY N/A 10/24/2017  ? Procedure: HEMORRHOIDECTOMY;  Surgeon: Ileana Roup, MD;  Location: [place];  Service: General;  Laterality: N/A;  ? IR GENERIC HISTORICAL  10/23/2016  ? IR FLUORO GUIDE CV LINE RIGHT 10/23/2016 Greggory Keen, MD [facility]  ? IR GENERIC HISTORICAL  10/23/2016  ? IR US GUIDE VASC ACCESS RIGHT 10/23/2016 Greggory Keen, MD  [facility]  ? LEFT URETEROSCOPIC STONE EXTRACTION  04-10-2006  ? STONE EXTRACTION WITH BASKET Right 07/14/2015  ? Procedure: STONE EXTRACTION WITH BASKET;  Surgeon: Alexis Frock, MD;  Location: [place] [facility]

## 2022-03-30 ENCOUNTER — Other Ambulatory Visit: Payer: Self-pay | Admitting: Cardiology

## 2022-04-15 ENCOUNTER — Encounter: Payer: Self-pay | Admitting: Orthopedic Surgery

## 2022-04-15 ENCOUNTER — Telehealth: Payer: Self-pay | Admitting: Orthopedic Surgery

## 2022-04-15 ENCOUNTER — Ambulatory Visit: Payer: Medicare HMO | Admitting: Orthopedic Surgery

## 2022-04-15 DIAGNOSIS — M1811 Unilateral primary osteoarthritis of first carpometacarpal joint, right hand: Secondary | ICD-10-CM

## 2022-04-15 NOTE — Telephone Encounter (Signed)
Patient is in severe pain in his thumb that he had worked on today patient has injection this morning and has taken 3 tylenol and nothing is helping he would like something to help with pain, please advise if rx is being sent in please send to [facility] on [address].

## 2022-04-16 DIAGNOSIS — M1811 Unilateral primary osteoarthritis of first carpometacarpal joint, right hand: Secondary | ICD-10-CM | POA: Diagnosis not present

## 2022-04-16 MED ORDER — LIDOCAINE HCL 1 % IJ SOLN
0.5000 mL | INTRAMUSCULAR | Status: AC | PRN
  Administered 2022-04-16: .5 mL

## 2022-04-16 MED ORDER — BETAMETHASONE SOD PHOS & ACET 6 (3-3) MG/ML IJ SUSP
3.0000 mg | INTRAMUSCULAR | Status: AC | PRN
  Administered 2022-04-16: 3 mg via INTRA_ARTICULAR

## 2022-04-16 NOTE — Progress Notes (Signed)
Office Visit Note   Patient: Peter [name]           Date of Birth: 1946/01/27           MRN: [id number] Visit Date: 04/15/2022              Requested by: [name], MD [place],  [place] [address] PCP: [name], MD   Assessment & Plan: Visit Diagnoses:  1. Arthritis of carpometacarpal (CMC) joint of right thumb     Plan: Patient is osteoarthritis of the right thumb CMC joint with possible arthritic changes present at the right thumb MCP joint.  We discussed the nature of thumb arthritis as well as diagnosis, prognosis, conservative and surgical treatment options.  After discussion, he would like to try a corticosteroid injection in the right thumb CMC joint.  He does have pain with use of the thumb MCP joint.  We discussed conservative management of thumb MCP arthritis with bracing, topical and oral anti-inflammatory medications, and corticosteroid injections.  We also discussed that surgical treatment of thumb MCP or arthritis typically involves an arthrodesis.  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  We will see how much relief he gets from a steroid injection into the [facility] joint and we may be able to get by with continued conservative treatment of the MP joint even if we eventually move forward with surgery at the [facility] joint.  Follow-Up Instructions: No follow-ups on file.   Orders:  No orders of the defined types were placed in this encounter.  No orders of the defined types were placed in this encounter.     Procedures: Hand/UE Inj: R thumb CMC for osteoarthritis on 04/16/2022 8:40 AM Indications: therapeutic and pain Details: 25 G needle, fluoroscopy-guided radial approach Medications: 0.5 mL lidocaine 1 %; 3 mg betamethasone acetate-betamethasone sodium phosphate 6 (3-3) MG/ML Procedure, treatment alternatives, risks and benefits explained,  specific risks discussed. Consent was given by the patient. Immediately prior to procedure a time out was called to verify the correct patient, procedure, equipment, support staff and site/side marked as required. Patient was prepped and draped in the usual sterile fashion.      Clinical Data: No additional findings.   Subjective: Chief Complaint  Patient presents with   Right Hand - Pain    RIGHT Handed, Pain:10/10, Per Dr. Erlinda Hong he has no cartilage left at [facility] joint, can't move it at the [facility] joint and can't open salt/pepper, onset x 2 years but gotten worse over last 6 months, hurts to pick up anything.    This is a 76 year old right-hand-dominant male who presents with pain at the right thumb.  Is been going on for about 2 years now.  It has worsened over the last 6 months or so.  He describes pain at the both the base of the thumb and at the thumb MCP joint that can be as bad as 10/10 at worst.  He has difficulty with certain activities that involve removing a lid from a jar or twisting and grasping activities.  He is an avid Administrator, [employer] and has difficulty playing secondary to thumb pain both at the [facility] and the MP joints.  He has not had any treatment for this other than a CMC brace.   Review of Systems   Objective: Vital Signs: BP (!) 141/83 (BP  Location: Left Arm, Patient Position: Sitting)   Pulse 61   Ht '5\' 8"'$  (1.727 m)   Wt 219 lb (99.3 kg)   BMI 33.30 kg/m   Physical Exam Constitutional:      Appearance: Normal appearance.  Cardiovascular:     Rate and Rhythm: Normal rate.     Pulses: Normal pulses.  Pulmonary:     Effort: Pulmonary effort is normal.  Skin:    General: Skin is warm and dry.     Capillary Refill: Capillary refill takes less than 2 seconds.  Neurological:     Mental Status: He is alert.    Right Hand Exam   Tenderness  Right hand tenderness location: TTP at base of thumb at [facility] joint.  Other  Erythema: absent Sensation:  normal Pulse: present  Comments:  Pain and crepitus with CMC grind test.  Some discomfort with grind test at the MCP joint but no pain w/ A/PROM.  No static or dynamic MCP hyper-extension.  No palmar abduction contracture of thumb.      Specialty Comments:  No specialty comments available.  Imaging: No results found.   PMFS History: Patient Active Problem List   Diagnosis Date Noted   Arthritis of carpometacarpal [facility] joint of right thumb 04/16/2022   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   Chronic right shoulder pain 03/07/2022   Pain of right hand 03/07/2022   Diverticulitis 02/28/2018   RLQ abdominal pain    Sigmoid diverticulitis    Acute blood loss anemia 11/13/2017   Anticoagulation adequate 11/12/2016   Epistaxis, recurrent 11/12/2016   AF (atrial fibrillation) ([place]) 10/24/2016   Obesity 08/09/2016   Atrial fibrillation with RVR ([place]) 08/03/2016   New onset a-fib ([place]) 07/16/2016   Persistent atrial fibrillation ([place]) 53/29/9242   Acute systolic (congestive) heart failure (HCC)    Ejection fraction    Iron deficiency anemia due to chronic blood loss 02/04/2012   Anemia 02/03/2012   CKD (chronic kidney disease), stage III ([place]) 02/03/2012   DOE (dyspnea on exertion) 01/28/2012   Hypertension    Coronary artery disease due to lipid rich plaque    Dyslipidemia    GERD (gastroesophageal reflux disease)    Past Medical History:  Diagnosis Date   At risk for sleep apnea    STOP-BANG= 5       SENT TO PCP 07-12-2015   CAD (coronary artery disease)    a. s/p PCI with DES x 3 to LAD in 2006 with bifurcation lesion and Plavix indefinitely was recommended.   Chronic combined systolic and diastolic CHF (congestive heart failure) ([place])    a. Prior EF normal but EF dropped to 30-35% in 07/2016 in setting of AFIB.   CKD (chronic kidney disease), stage III (HCC)    Dyslipidemia    Dyspnea    tired quick d/t Afib   Dyspnea on exertion    GERD (gastroesophageal  reflux disease)    History of GI bleed    History of kidney stones    History of MI (myocardial infarction)    11-05-2005--  periprocedural MI  (cardiac cath) per dr [name] note   Hypertension    Hypertriglyceridemia    Iron deficiency anemia    Myocardial infarction [facility])    Persistent atrial fibrillation ([place])    a. 07/2016, started on Eliquis -> readmitted later that month for AF RVR/acute HF - initially unsuccessful DCCV following TEE, so loaded with amio with repeat successful DCCV 07/2816.   Right ureteral  stone    S/P drug eluting coronary stent placement    x3  to LAD  2006   Sleep apnea    wears BIPAP, 14 up to 24    Family History  Problem Relation Age of Onset   Heart attack Father 85   Congestive Heart Failure Father 27   Cancer Other     Past Surgical History:  Procedure Laterality Date   CARDIOVASCULAR STRESS TEST  01-06-2013  dr [name]   normal perfusion study/  no ischemia or infarct/  normal LV function and wall motion , ef 57%   CARDIOVERSION N/A 08/05/2016   Procedure: CARDIOVERSION;  Surgeon: Sanda Klein, MD;  Location: [place];  Service: Cardiovascular;  Laterality: N/A;   CARDIOVERSION N/A 08/08/2016   Procedure: CARDIOVERSION;  Surgeon: Jerline Pain, MD;  Location: [place];  Service: Cardiovascular;  Laterality: N/A;   CORONARY ANGIOPLASTY  11-13-2005 dr [name]   Successful touch up post-dilatation of the 3 tandem overlying Cypher stents in proximal to mid LAD (based on IVUS 0% stenosis and patent diagonal branch)/  Cutting Balloon Angioplasty of Ramus branch   CORONARY ANGIOPLASTY WITH STENT PLACEMENT  11-05-2005   dr [name]   PCI and DES x3 to birfurcation lesion LAD and diagonal branch of LAD/   20% dLM,  30-40% RCA, ef 60%   CYSTOSCOPY WITH RETROGRADE PYELOGRAM, URETEROSCOPY AND STENT PLACEMENT Right 07/14/2015   Procedure: CYSTOSCOPY WITH RETROGRADE PYELOGRAM, URETEROSCOPY AND STENT PLACEMENT;  Surgeon: Alexis Frock, MD;  Location: [facility];  Service: Urology;  Laterality: Right;   ELECTROPHYSIOLOGIC STUDY N/A 10/24/2016   Procedure: Atrial Fibrillation Ablation;  Surgeon: Will Meredith Leeds, MD;  Location: [place] CV LAB;  Service: Cardiovascular;  Laterality: N/A;   EXTRACORPOREAL SHOCK WAVE LITHOTRIPSY  yrs ago   HEMORRHOID SURGERY N/A 10/24/2017   Procedure: HEMORRHOIDECTOMY;  Surgeon: Ileana Roup, MD;  Location: [place] [facility];  Service: General;  Laterality: N/A;   IR GENERIC HISTORICAL  10/23/2016   IR FLUORO GUIDE CV LINE RIGHT 10/23/2016 Greggory Keen, MD [facility]   IR GENERIC HISTORICAL  10/23/2016   IR US GUIDE VASC ACCESS RIGHT 10/23/2016 Greggory Keen, MD [facility]   LEFT URETEROSCOPIC STONE EXTRACTION  04-10-2006   STONE EXTRACTION WITH BASKET Right 07/14/2015   Procedure: STONE EXTRACTION WITH BASKET;  Surgeon: Alexis Frock, MD;  Location: [facility]  Service: Urology;  Laterality: Right;   TEE WITHOUT CARDIOVERSION N/A 08/05/2016   Procedure: TRANSESOPHAGEAL ECHOCARDIOGRAM (TEE);  Surgeon: Sanda Klein, MD;  Location: [facility] ENDOSCOPY;  Service: Cardiovascular;  Laterality: N/A;   TRANSTHORACIC ECHOCARDIOGRAM  02-04-2012   grade I diastolic dysfunction/  ef 55-60%/  trivial MR and TR/  mild LAE   Social History   Occupational History   Not on file  Tobacco Use   Smoking status: Never   Smokeless tobacco: Never  Vaping Use   Vaping Use: Never used  Substance and Sexual Activity   Alcohol use: No    Comment: 2 beers a year   Drug use: No   Sexual activity: Not on file

## 2022-04-16 NOTE — Telephone Encounter (Signed)
Attempted to contact patient to follow up on injection that was received in his thumb which caused pain however had to leave a voicemail. Office number was provided to the patient to return call when he is able to do so.

## 2022-05-07 ENCOUNTER — Encounter: Payer: Self-pay | Admitting: Orthopaedic Surgery

## 2022-05-07 ENCOUNTER — Ambulatory Visit: Payer: Medicare HMO | Admitting: Orthopaedic Surgery

## 2022-05-07 VITALS — Ht 68.0 in | Wt 209.0 lb

## 2022-05-07 DIAGNOSIS — M1611 Unilateral primary osteoarthritis, right hip: Secondary | ICD-10-CM

## 2022-05-07 NOTE — Progress Notes (Signed)
Office Visit Note   Patient: Peter [name]           Date of Birth: [birth year]           MRN: [id number] Visit Date: 05/07/2022              Requested by: Darrow Bussing, MD [address] [place],  [place] [address] PCP: Darrow Bussing, MD   Assessment & Plan: Visit Diagnoses:  1. Primary osteoarthritis of right hip     Plan: Impression is end-stage right hip DJD.  Unfortunately the second injection was not effective.  He has to walk with crutch now.  Experiencing severe chronic pain in hip and groin with interruption of quality of life and ADLs.  Based on options he has elected for a right total hip replacement pending medical and cardiac clearance.  He will need to stop Xarelto 3 days in advance of surgery.  Risk benefits rehab recovery prognosis the surgery reviewed with the patient detail.  Questions encouraged and answered.  Eunice Blase will call the patient to schedule surgery as soon as we get the clearances.  Follow-Up Instructions: No follow-ups on file.   Orders:  No orders of the defined types were placed in this encounter.  No orders of the defined types were placed in this encounter.     Procedures: No procedures performed   Clinical Data: No additional findings.   Subjective: Chief Complaint  Patient presents with   Right Hip - Pain    HPI Peter [name] returns today for ongoing severe right hip and groin pain secondary to DJD.  This most recent injection performed in March lasted about 10 days.  This was the second injection.  First 1 worked about 4 months.  He is now having to walk with a crutch.  Experiencing a lot of pain and difficulty with ADLs.  Review of Systems   Objective: Vital Signs: Ht 5\' 8"  (1.727 m)   Wt 209 lb (94.8 kg)   BMI 31.78 kg/m   Physical Exam  Ortho Exam Examination right hip shows severe pain with any circumduction or hip flexion with internal rotation. Specialty Comments:  No specialty comments  available.  Imaging: No results found.   PMFS History: Patient Active Problem List   Diagnosis Date Noted   Arthritis of carpometacarpal [facility]) joint of right thumb 04/16/2022   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   Chronic right shoulder pain 03/07/2022   Pain of right hand 03/07/2022   Diverticulitis 02/28/2018   RLQ abdominal pain    Sigmoid diverticulitis    Acute blood loss anemia 11/13/2017   Anticoagulation adequate 11/12/2016   Epistaxis, recurrent 11/12/2016   AF (atrial fibrillation) (HCC) 10/24/2016   Obesity 08/09/2016   Atrial fibrillation with RVR (HCC) 08/03/2016   New onset a-fib (HCC) 07/16/2016   Persistent atrial fibrillation (HCC) 07/16/2016   Acute systolic (congestive) heart failure (HCC)    Ejection fraction    Iron deficiency anemia due to chronic blood loss 02/04/2012   Anemia 02/03/2012   CKD (chronic kidney disease), stage III (HCC) 02/03/2012   DOE (dyspnea on exertion) 01/28/2012   Hypertension    Coronary artery disease due to lipid rich plaque    Dyslipidemia    GERD (gastroesophageal reflux disease)    Past Medical History:  Diagnosis Date   At risk for sleep apnea    STOP-BANG= 5       SENT TO PCP 07-12-2015   CAD (coronary  artery disease)    a. s/p PCI with DES x 3 to LAD in 2006 with bifurcation lesion and Plavix indefinitely was recommended.   Chronic combined systolic and diastolic CHF (congestive heart failure) (HCC)    a. Prior EF normal but EF dropped to 30-35% in 07/2016 in setting of AFIB.   CKD (chronic kidney disease), stage III (HCC)    Dyslipidemia    Dyspnea    tired quick d/t Afib   Dyspnea on exertion    GERD (gastroesophageal reflux disease)    History of GI bleed    History of kidney stones    History of MI (myocardial infarction)    11-05-2005--  periprocedural MI  (cardiac cath) per dr Juanda Chance note   Hypertension    Hypertriglyceridemia    Iron deficiency anemia    Myocardial infarction [facility])     Persistent atrial fibrillation (HCC)    a. 07/2016, started on Eliquis -> readmitted later that month for AF RVR/acute HF - initially unsuccessful DCCV following TEE, so loaded with amio with repeat successful DCCV 07/2816.   Right ureteral stone    S/P drug eluting coronary stent placement    x3  to LAD  2006   Sleep apnea    wears BIPAP, 14 up to 24    Family History  Problem Relation Age of Onset   Heart attack Father 88   Congestive Heart Failure Father 17   Cancer Other     Past Surgical History:  Procedure Laterality Date   CARDIOVASCULAR STRESS TEST  01-06-2013  dr Myrtis Ser   normal perfusion study/  no ischemia or infarct/  normal LV function and wall motion , ef 57%   CARDIOVERSION N/A 08/05/2016   Procedure: CARDIOVERSION;  Surgeon: Thurmon Fair, MD;  [facility]: [facility] ENDOSCOPY;  Service: Cardiovascular;  Laterality: N/A;   CARDIOVERSION N/A 08/08/2016   Procedure: CARDIOVERSION;  Surgeon: Jake Bathe, MD;  Location: [facility] ENDOSCOPY;  Service: Cardiovascular;  Laterality: N/A;   CORONARY ANGIOPLASTY  11-13-2005 dr Juanda Chance   Successful touch up post-dilatation of the 3 tandem overlying Cypher stents in proximal to mid LAD (based on IVUS 0% stenosis and patent diagonal branch)/  Cutting Balloon Angioplasty of Ramus branch   CORONARY ANGIOPLASTY WITH STENT PLACEMENT  11-05-2005   dr Smitty Cords brodie   PCI and DES x3 to birfurcation lesion LAD and diagonal branch of LAD/   20% dLM,  30-40% RCA, ef 60%   CYSTOSCOPY WITH RETROGRADE PYELOGRAM, URETEROSCOPY AND STENT PLACEMENT Right 07/14/2015   Procedure: CYSTOSCOPY WITH RETROGRADE PYELOGRAM, URETEROSCOPY AND STENT PLACEMENT;  Surgeon: Sebastian Ache, MD;  Location: [facility];  Service: Urology;  Laterality: Right;   ELECTROPHYSIOLOGIC STUDY N/A 10/24/2016   Procedure: Atrial Fibrillation Ablation;  Surgeon: Will Jorja Loa, MD;  [facility] INVASIVE CV LAB;  Service: Cardiovascular;  Laterality: N/A;   EXTRACORPOREAL SHOCK WAVE  LITHOTRIPSY  yrs ago   HEMORRHOID SURGERY N/A 10/24/2017   Procedure: HEMORRHOIDECTOMY;  Surgeon: Andria Meuse, MD;  Location: [facility] OR;  Service: General;  Laterality: N/A;   IR GENERIC HISTORICAL  10/23/2016   IR FLUORO GUIDE CV LINE RIGHT 10/23/2016 Berdine Dance, MD [facility]   IR GENERIC HISTORICAL  10/23/2016   IR US GUIDE VASC ACCESS RIGHT 10/23/2016 Berdine Dance, MD [facility]   LEFT URETEROSCOPIC STONE EXTRACTION  04-10-2006   STONE EXTRACTION WITH BASKET Right 07/14/2015   Procedure: STONE EXTRACTION WITH BASKET;  Surgeon: Sebastian Ache, MD;  Location: [facility];  Service: Urology;  Laterality: Right;   TEE WITHOUT CARDIOVERSION N/A 08/05/2016   Procedure: TRANSESOPHAGEAL ECHOCARDIOGRAM (TEE);  Surgeon: Thurmon Fair, MD;  Location: [facility] ENDOSCOPY;  Service: Cardiovascular;  Laterality: N/A;   TRANSTHORACIC ECHOCARDIOGRAM  02-04-2012   grade I diastolic dysfunction/  ef 55-60%/  trivial MR and TR/  mild LAE   Social History   Occupational History   Not on file  Tobacco Use   Smoking status: Never   Smokeless tobacco: Never  Vaping Use   Vaping Use: Never used  Substance and Sexual Activity   Alcohol use: No    Comment: 2 beers a year   Drug use: No   Sexual activity: Not on file

## 2022-05-08 ENCOUNTER — Telehealth: Payer: Self-pay

## 2022-05-08 NOTE — Telephone Encounter (Signed)
   Pre-operative Risk Assessment    Patient Name: Peter [name]  DOB: 12-15-1945 MRN: [id number]      Request for Surgical Clearance    Procedure:   Right total hip arthroplasty  Date of Surgery:  Clearance TBD                                 Surgeon:  Dr. Erlinda Hong Surgeon's Group or Practice Name:  [facility] Phone number:  [phone] Fax number:  [phone] ATTN: Jackelyn Poling   Type of Clearance Requested:   - Medical  - Pharmacy:  Hold Rivaroxaban (Xarelto) x 3 days   Type of Anesthesia:  Spinal   Additional requests/questions:   None  Signed, Cindy Fullman   05/08/2022, 3:40 PM

## 2022-05-09 NOTE — Telephone Encounter (Signed)
Patient with diagnosis of afib on Xarelto for anticoagulation.    Procedure: right THA Date of procedure: TBD  CHA2DS2-VASc Score = 5  This indicates a 7.2% annual risk of stroke. The patient's score is based upon: CHF History: 1 HTN History: 1 Diabetes History: 0 Stroke History: 0 Vascular Disease History: 1 Age Score: 2 Gender Score: 0   CrCl 63m/min Platelet count - hasn't been checked in 4 years, needs to be repeated  Per office protocol, patient can hold Xarelto for 3 days prior to procedure.    **This guidance is not considered finalized until pre-operative APP has relayed final recommendations.**

## 2022-05-13 DIAGNOSIS — E7800 Pure hypercholesterolemia, unspecified: Secondary | ICD-10-CM | POA: Diagnosis not present

## 2022-05-13 DIAGNOSIS — N1831 Chronic kidney disease, stage 3a: Secondary | ICD-10-CM | POA: Diagnosis not present

## 2022-05-13 DIAGNOSIS — R7301 Impaired fasting glucose: Secondary | ICD-10-CM | POA: Diagnosis not present

## 2022-05-13 DIAGNOSIS — M1A9XX0 Chronic gout, unspecified, without tophus (tophi): Secondary | ICD-10-CM | POA: Diagnosis not present

## 2022-05-13 DIAGNOSIS — R7303 Prediabetes: Secondary | ICD-10-CM | POA: Diagnosis not present

## 2022-05-13 DIAGNOSIS — Z79899 Other long term (current) drug therapy: Secondary | ICD-10-CM | POA: Diagnosis not present

## 2022-05-13 NOTE — Telephone Encounter (Signed)
   Name: Peter [name]  DOB: 1945/12/11  MRN: [id number]  Primary Cardiologist: [name], MD   Preoperative team, please contact this patient and set up a phone call appointment for further preoperative risk assessment. Please obtain consent and complete medication review. Thank you for your help.  I confirm that guidance regarding antiplatelet and oral anticoagulation therapy has been completed and, if necessary, noted below.  Patient with diagnosis of afib on Xarelto for anticoagulation.     Procedure: right THA Date of procedure: TBD   CHA2DS2-VASc Score = 5  This indicates a 7.2% annual risk of stroke. The patient's score is based upon: CHF History: 1 HTN History: 1 Diabetes History: 0 Stroke History: 0 Vascular Disease History: 1 Age Score: 2 Gender Score: 0   CrCl 36m/min Platelet count - hasn't been checked in 4 years, needs to be repeated   Per office protocol, patient can hold Xarelto for 3 days prior to procedure.     ELenna Sciara NP 05/13/2022, 11:41 AM [place] [id number]

## 2022-05-13 NOTE — Telephone Encounter (Signed)
Left message for the pt to call the office for tele visit, pre op

## 2022-05-15 ENCOUNTER — Telehealth: Payer: Self-pay | Admitting: Cardiology

## 2022-05-15 ENCOUNTER — Telehealth: Payer: Self-pay | Admitting: *Deleted

## 2022-05-15 NOTE — Telephone Encounter (Signed)
Returned call to the pt and left message for pt to cal back. Pt needs to schedule a tele pre op appt

## 2022-05-15 NOTE — Telephone Encounter (Signed)
Patient was returning call. Please advise ?

## 2022-05-15 NOTE — Telephone Encounter (Signed)
Patient returned RN's call.  Request call back on mobile# [phone].

## 2022-05-15 NOTE — Telephone Encounter (Signed)
Pt has been scheduled for tele pre op appt 05/21/22@ 11:40. Med rec and consent are done.

## 2022-05-15 NOTE — Telephone Encounter (Signed)
Pt has been scheduled for tele pre op appt 05/21/22@ 11:40. Med rec and consent are done.     Patient Consent for Virtual Visit        Peter [name] has provided verbal consent on 05/15/2022 for a virtual visit (video or telephone).   CONSENT FOR VIRTUAL VISIT FOR:  Peter [name]  By participating in this virtual visit I agree to the following:  I hereby voluntarily request, consent and authorize [facility] and its employed or contracted physicians, physician assistants, nurse practitioners or other licensed health care professionals (the Practitioner), to provide me with telemedicine health care services (the "Services") as deemed necessary by the treating Practitioner. I acknowledge and consent to receive the Services by the Practitioner via telemedicine. I understand that the telemedicine visit will involve communicating with the Practitioner through live audiovisual communication technology and the disclosure of certain medical information by electronic transmission. I acknowledge that I have been given the opportunity to request an in-person assessment or other available alternative prior to the telemedicine visit and am voluntarily participating in the telemedicine visit.  I understand that I have the right to withhold or withdraw my consent to the use of telemedicine in the course of my care at any time, without affecting my right to future care or treatment, and that the Practitioner or I may terminate the telemedicine visit at any time. I understand that I have the right to inspect all information obtained and/or recorded in the course of the telemedicine visit and may receive copies of available information for a reasonable fee.  I understand that some of the potential risks of receiving the Services via telemedicine include:  Delay or interruption in medical evaluation due to technological equipment failure or disruption; Information transmitted may not be sufficient (e.g. poor  resolution of images) to allow for appropriate medical decision making by the Practitioner; and/or  In rare instances, security protocols could fail, causing a breach of personal health information.  Furthermore, I acknowledge that it is my responsibility to provide information about my medical history, conditions and care that is complete and accurate to the best of my ability. I acknowledge that Practitioner's advice, recommendations, and/or decision may be based on factors not within their control, such as incomplete or inaccurate data provided by me or distortions of diagnostic images or specimens that may result from electronic transmissions. I understand that the practice of medicine is not an exact science and that Practitioner makes no warranties or guarantees regarding treatment outcomes. I acknowledge that a copy of this consent can be made available to me via my patient portal ([facility]), or I can request a printed copy by calling the office of [facility].    I understand that my insurance will be billed for this visit.   I have read or had this consent read to me. I understand the contents of this consent, which adequately explains the benefits and risks of the Services being provided via telemedicine.  I have been provided ample opportunity to ask questions regarding this consent and the Services and have had my questions answered to my satisfaction. I give my informed consent for the services to be provided through the use of telemedicine in my medical care

## 2022-05-17 DIAGNOSIS — R454 Irritability and anger: Secondary | ICD-10-CM | POA: Diagnosis not present

## 2022-05-17 DIAGNOSIS — K219 Gastro-esophageal reflux disease without esophagitis: Secondary | ICD-10-CM | POA: Diagnosis not present

## 2022-05-17 DIAGNOSIS — I2510 Atherosclerotic heart disease of native coronary artery without angina pectoris: Secondary | ICD-10-CM | POA: Diagnosis not present

## 2022-05-17 DIAGNOSIS — Z79899 Other long term (current) drug therapy: Secondary | ICD-10-CM | POA: Diagnosis not present

## 2022-05-17 DIAGNOSIS — I480 Paroxysmal atrial fibrillation: Secondary | ICD-10-CM | POA: Diagnosis not present

## 2022-05-17 DIAGNOSIS — E7800 Pure hypercholesterolemia, unspecified: Secondary | ICD-10-CM | POA: Diagnosis not present

## 2022-05-17 DIAGNOSIS — I10 Essential (primary) hypertension: Secondary | ICD-10-CM | POA: Diagnosis not present

## 2022-05-17 DIAGNOSIS — Z0000 Encounter for general adult medical examination without abnormal findings: Secondary | ICD-10-CM | POA: Diagnosis not present

## 2022-05-17 DIAGNOSIS — N1831 Chronic kidney disease, stage 3a: Secondary | ICD-10-CM | POA: Diagnosis not present

## 2022-05-21 ENCOUNTER — Ambulatory Visit (INDEPENDENT_AMBULATORY_CARE_PROVIDER_SITE_OTHER): Payer: Medicare HMO | Admitting: Nurse Practitioner

## 2022-05-21 ENCOUNTER — Encounter: Payer: Self-pay | Admitting: Nurse Practitioner

## 2022-05-21 DIAGNOSIS — Z01810 Encounter for preprocedural cardiovascular examination: Secondary | ICD-10-CM

## 2022-05-21 NOTE — Progress Notes (Signed)
Virtual Visit via Telephone Note   Because of Verlyn V Bettes's co-morbid illnesses, he is at least at moderate risk for complications without adequate follow up.  This format is felt to be most appropriate for this patient at this time.  The patient did not have access to video technology/had technical difficulties with video requiring transitioning to audio format only (telephone).  All issues noted in this document were discussed and addressed.  No physical exam could be performed with this format.  Please refer to the patient's chart for his consent to telehealth for [facility].  Evaluation Performed:  Preoperative cardiovascular risk assessment _____________   Date:  05/21/2022   Patient ID:  Peter [name], DOB 07-07-1946, MRN [id number] Patient Location:  Home Provider location:   Office  Primary Care Provider:  [name], MD Primary Cardiologist:  [name], MD  Chief Complaint / Patient Profile   76 y.o. y/o male with a h/o persistent atrial fibrillation s/p ablation 2017, CAD s/p PCI x 3 to LAD in 2006, chronic HFpEF, hyperlipidemia, CKD, hypertension, and obesity who is pending right total hip arthroplasty and presents today for telephonic preoperative cardiovascular risk assessment.  Past Medical History    Past Medical History:  Diagnosis Date   At risk for sleep apnea    STOP-BANG= 5       SENT TO PCP 07-12-2015   CAD (coronary artery disease)    a. s/p PCI with DES x 3 to LAD in 2006 with bifurcation lesion and Plavix indefinitely was recommended.   Chronic combined systolic and diastolic CHF (congestive heart failure) ([place])    a. Prior EF normal but EF dropped to 30-35% in 07/2016 in setting of AFIB.   CKD (chronic kidney disease), stage III (HCC)    Dyslipidemia    Dyspnea    tired quick d/t Afib   Dyspnea on exertion    GERD (gastroesophageal reflux disease)    History of GI bleed    History of kidney stones    History of MI (myocardial  infarction)    11-05-2005--  periprocedural MI  (cardiac cath) per dr [name] note   Hypertension    Hypertriglyceridemia    Iron deficiency anemia    Myocardial infarction [facility])    Persistent atrial fibrillation ([place])    a. 07/2016, started on Eliquis -> readmitted later that month for AF RVR/acute HF - initially unsuccessful DCCV following TEE, so loaded with amio with repeat successful DCCV 07/2816.   Right ureteral stone    S/P drug eluting coronary stent placement    x3  to LAD  2006   Sleep apnea    wears BIPAP, 14 up to 24   Past Surgical History:  Procedure Laterality Date   CARDIOVASCULAR STRESS TEST  01-06-2013  dr [name]   normal perfusion study/  no ischemia or infarct/  normal LV function and wall motion , ef 57%   CARDIOVERSION N/A 08/05/2016   Procedure: CARDIOVERSION;  Surgeon: Sanda Klein, MD;  Location: [facility];  Service: Cardiovascular;  Laterality: N/A;   CARDIOVERSION N/A 08/08/2016   Procedure: CARDIOVERSION;  Surgeon: Jerline Pain, MD;  Location: [facility];  Service: Cardiovascular;  Laterality: N/A;   CORONARY ANGIOPLASTY  11-13-2005 dr [name]   Successful touch up post-dilatation of the 3 tandem overlying Cypher stents in proximal to mid LAD (based on IVUS 0% stenosis and patent diagonal branch)/  Cutting Balloon Angioplasty of Ramus branch   CORONARY ANGIOPLASTY WITH STENT PLACEMENT  11-05-2005   dr [name]   PCI and DES x3 to birfurcation lesion LAD and diagonal branch of LAD/   20% dLM,  30-40% RCA, ef 60%   CYSTOSCOPY WITH RETROGRADE PYELOGRAM, URETEROSCOPY AND STENT PLACEMENT Right 07/14/2015   Procedure: CYSTOSCOPY WITH RETROGRADE PYELOGRAM, URETEROSCOPY AND STENT PLACEMENT;  Surgeon: Alexis Frock, MD;  Location: [facility];  Service: Urology;  Laterality: Right;   ELECTROPHYSIOLOGIC STUDY N/A 10/24/2016   Procedure: Atrial Fibrillation Ablation;  Surgeon: Will Meredith Leeds, MD;  Location: [facility] CV LAB;  Service:  Cardiovascular;  Laterality: N/A;   EXTRACORPOREAL SHOCK WAVE LITHOTRIPSY  yrs ago   HEMORRHOID SURGERY N/A 10/24/2017   Procedure: HEMORRHOIDECTOMY;  Surgeon: Ileana Roup, MD;  Location: [facility] OR;  Service: General;  Laterality: N/A;   IR GENERIC HISTORICAL  10/23/2016   IR FLUORO GUIDE CV LINE RIGHT 10/23/2016 Greggory Keen, MD [facility]   IR GENERIC HISTORICAL  10/23/2016   IR US GUIDE VASC ACCESS RIGHT 10/23/2016 Greggory Keen, MD [facility]   LEFT URETEROSCOPIC STONE EXTRACTION  04-10-2006   STONE EXTRACTION WITH BASKET Right 07/14/2015   Procedure: STONE EXTRACTION WITH BASKET;  Surgeon: Alexis Frock, MD;  Location: [facility];  Service: Urology;  Laterality: Right;   TEE WITHOUT CARDIOVERSION N/A 08/05/2016   Procedure: TRANSESOPHAGEAL ECHOCARDIOGRAM (TEE);  Surgeon: Sanda Klein, MD;  Location: [facility] ENDOSCOPY;  Service: Cardiovascular;  Laterality: N/A;   TRANSTHORACIC ECHOCARDIOGRAM  02-04-2012   grade I diastolic dysfunction/  ef 55-60%/  trivial MR and TR/  mild LAE    Allergies  No Known Allergies  History of Present Illness    Peter [name] is a 76 y.o. male who presents via audio/video conferencing for a telehealth visit today.  Pt was last seen in cardiology clinic on 12/06/21 by Allegra Lai, MD. At that time Peter [name] was doing well.  The patient is now pending procedure as outlined above. Since his last visit, he denies chest pain, shortness of breath, lower extremity edema, fatigue, palpitations, melena, hematuria, hemoptysis, diaphoresis, weakness, presyncope, syncope, orthopnea, and PND. BP elevated at recent visit with PCP. Was advised to monitor and report back. He reports BP today is 145/84.    Home Medications    Prior to Admission medications   Medication Sig Start Date End Date Taking? Authorizing Provider  allopurinol (ZYLOPRIM) 100 MG tablet Take 100 mg by mouth at bedtime. 11/19/21   [provider]  allopurinol  (ZYLOPRIM) 300 MG tablet Take 300 mg by mouth daily.    [provider]  atorvastatin (LIPITOR) 40 MG tablet Take 1 tablet (40 mg total) by mouth every morning. Please keep upcoming appt in January 2023 with Dr. [name] before anymore refills. Thank you 10/10/21   Constance Haw, MD  carvedilol (COREG) 25 MG tablet TAKE 1 TABLET TWICE DAILY WITH MEALS 11/01/21   Jerline Pain, MD  diphenhydramine-acetaminophen (TYLENOL PM) 25-500 MG TABS tablet Take 1 tablet by mouth at bedtime.    [provider]  ferrous sulfate 325 (65 FE) MG tablet Take 1 tablet (325 mg total) by mouth every morning. 10/27/18   Camnitz, Will Hassell Done, MD  furosemide (LASIX) 40 MG tablet TAKE 1 TABLET EVERY DAY 11/01/21   Jerline Pain, MD  lisinopril (ZESTRIL) 40 MG tablet Take 1 tablet (40 mg total) by mouth daily. 04/01/22   Camnitz, Ocie Doyne, MD  nitroGLYCERIN (NITROSTAT) 0.4 MG SL tablet Place 1 tablet (0.4 mg total)  under the tongue every 5 (five) minutes as needed for chest pain. X 3 doses 11/20/20   Camnitz, Will Hassell Done, MD  pantoprazole (PROTONIX) 40 MG tablet TAKE 1 TABLET EVERY DAY 11/01/21   Jerline Pain, MD  polyvinyl alcohol (LIQUIFILM TEARS) 1.4 % ophthalmic solution Place 1 drop into the right eye daily.    [provider]  potassium chloride SA (KLOR-CON M) 20 MEQ tablet TAKE 1 TABLET EVERY DAY 11/01/21   Jerline Pain, MD  XARELTO 20 MG TABS tablet TAKE 1 TABLET EVERY DAY 11/01/21   Constance Haw, MD    Physical Exam    Vital Signs:  Peter [name] does not have vital signs available for review today.  Given telephonic nature of communication, physical exam is limited. AAOx3. NAD. Normal affect.  Speech and respirations are unlabored.  Accessory Clinical Findings    None  Assessment & Plan    1.  Preoperative Cardiovascular Risk Assessment: Patient is doing well from a cardiac perspective and may proceed to surgery without further testing. According to the  Revised Cardiac Risk Index (RCRI), his Perioperative Risk of Major Cardiac Event is (%): 0.9.  His Functional Capacity in METs is: 6.18 according to the [facility] Activity Status Index (DASI).  Per office protocol, patient can hold Xarelto for 3 days prior to procedure.    A copy of this note will be routed to requesting surgeon.  Time:   Today, I have spent 10 minutes with the patient with telehealth technology discussing medical history, symptoms, and management plan.     Emmaline Life, NP-C    05/21/2022, 11:48 AM [place] [address] Office [phone] Fax [phone]

## 2022-06-05 DIAGNOSIS — N1831 Chronic kidney disease, stage 3a: Secondary | ICD-10-CM | POA: Diagnosis not present

## 2022-06-12 ENCOUNTER — Other Ambulatory Visit: Payer: Self-pay

## 2022-06-18 ENCOUNTER — Other Ambulatory Visit: Payer: Self-pay | Admitting: Physician Assistant

## 2022-06-18 ENCOUNTER — Telehealth: Payer: Self-pay | Admitting: Orthopaedic Surgery

## 2022-06-18 MED ORDER — HYDROCODONE-ACETAMINOPHEN 5-325 MG PO TABS
1.0000 | ORAL_TABLET | Freq: Two times a day (BID) | ORAL | 0 refills | Status: DC | PRN
Start: 2022-06-18 — End: 2022-07-17

## 2022-06-18 NOTE — Telephone Encounter (Signed)
Patient called asked if there is anything he can take that can help with the pain he is experiencing. Patient said it is hard for him to sleep at night and even sitting in the chair is so uncomfortable. Patient said he can not even go to work.    The number to contact patient is [phone]

## 2022-06-18 NOTE — Telephone Encounter (Signed)
I have sent in [place]co

## 2022-07-08 ENCOUNTER — Other Ambulatory Visit: Payer: Self-pay | Admitting: Physician Assistant

## 2022-07-08 MED ORDER — DOCUSATE SODIUM 100 MG PO CAPS: 100.0000 mg | ORAL_CAPSULE | Freq: Every day | ORAL | 2 refills | Status: DC | PRN

## 2022-07-08 MED ORDER — OXYCODONE-ACETAMINOPHEN 5-325 MG PO TABS: 1.0000 | ORAL_TABLET | Freq: Four times a day (QID) | ORAL | 0 refills | Status: DC | PRN

## 2022-07-08 MED ORDER — METHOCARBAMOL 500 MG PO TABS: 500.0000 mg | ORAL_TABLET | Freq: Two times a day (BID) | ORAL | 0 refills | Status: DC | PRN

## 2022-07-08 MED ORDER — ONDANSETRON HCL 4 MG PO TABS: 4.0000 mg | ORAL_TABLET | Freq: Three times a day (TID) | ORAL | 0 refills | Status: DC | PRN

## 2022-07-08 NOTE — Pre-Procedure Instructions (Signed)
Surgical Instructions    Your procedure is scheduled on 07/16/22.  Report to [facility] Main Entrance "A" at 05:30 A.M., then check in with the Admitting office.  Call this number if you have problems the morning of surgery:  [phone]   If you have any questions prior to your surgery date call [phone]: Open Monday-Friday 8am-4pm    Remember:  Do not eat after midnight the night before your surgery  You may drink clear liquids until 04:30 the morning of your surgery.   Clear liquids allowed are: Water, Non-Citrus Juices (without pulp), Carbonated Beverages, Clear Tea, Black Coffee ONLY (NO MILK, CREAM OR POWDERED CREAMER of any kind), and Gatorade    Enhanced Recovery after Surgery for Orthopedics Enhanced Recovery after Surgery is a protocol used to improve the stress on your body and your recovery after surgery.  Patient Instructions  The day of surgery (if you do NOT have diabetes):  Drink ONE (1) Pre-Surgery Clear Ensure by __04:30___ am the morning of surgery   This drink was given to you during your hospital  pre-op appointment visit. Nothing else to drink after completing the  Pre-Surgery Clear Ensure.         If you have questions, please contact your surgeon's office.   Take these medicines the morning of surgery with A SIP OF WATER:  allopurinol (ZYLOPRIM), carvedilol (COREG, pantoprazole (PROTONIX), sertraline (ZOLOFT)  Please follow your surgeon's instructions for XARELTO . If you have not received instructions, please contact your surgeon's office for blood thinner instructions.   As of today, STOP taking any Aspirin (unless otherwise instructed by your surgeon) Aleve, Naproxen, Ibuprofen, Motrin, Advil, Goody's, BC's, all herbal medications, fish oil, and all vitamins.           Do not wear jewelry or makeup. Do not wear lotions, powders, perfumes/cologne or deodorant. Do not shave 48 hours prior to surgery.  Men may shave face and neck. Do not bring  valuables to the hospital. Do not wear nail polish, gel polish, artificial nails, or any other type of covering on natural nails (fingers and toes) If you have artificial nails or gel coating that need to be removed by a nail salon, please have this removed prior to surgery. Artificial nails or gel coating may interfere with anesthesia's ability to adequately monitor your vital signs.  [place] is not responsible for any belongings or valuables.    Do NOT Smoke (Tobacco/Vaping)  24 hours prior to your procedure  If you use a CPAP at night, you may bring your mask for your overnight stay.   Contacts, glasses, hearing aids, dentures or partials may not be worn into surgery, please bring cases for these belongings   For patients admitted to the hospital, discharge time will be determined by your treatment team.   Patients discharged the day of surgery will not be allowed to drive home, and someone needs to stay with them for 24 hours.   SURGICAL WAITING ROOM VISITATION Patients having surgery or a procedure may have no more than 2 support people in the waiting area - these visitors may rotate.   Children under the age of 86 must have an adult with them who is not the patient. If the patient needs to stay at the hospital during part of their recovery, the visitor guidelines for inpatient rooms apply. Pre-op nurse will coordinate an appropriate time for 1 support person to accompany patient in pre-op.  This support person may not rotate.  Please refer to the [facility] website for the visitor guidelines for Inpatients (after your surgery is over and you are in a regular room).    Special instructions:    Oral Hygiene is also important to reduce your risk of infection.  Remember - BRUSH YOUR TEETH THE MORNING OF SURGERY WITH YOUR REGULAR TOOTHPASTE   [place]- Preparing For Surgery  Before surgery, you can play an important role. Because skin is not sterile, your skin needs to be as  free of germs as possible. You can reduce the number of germs on your skin by washing with CHG (chlorahexidine gluconate) Soap before surgery.  CHG is an antiseptic cleaner which kills germs and bonds with the skin to continue killing germs even after washing.     Please do not use if you have an allergy to CHG or antibacterial soaps. If your skin becomes reddened/irritated stop using the CHG.  Do not shave (including legs and underarms) for at least 48 hours prior to first CHG shower. It is OK to shave your face.  Please follow these instructions carefully.     Shower the NIGHT BEFORE SURGERY and the MORNING OF SURGERY with CHG Soap.   If you chose to wash your hair, wash your hair first as usual with your normal shampoo. After you shampoo, rinse your hair and body thoroughly to remove the shampoo.  Then [employer] and genitals (private parts) with your normal soap and rinse thoroughly to remove soap.  After that Use CHG Soap as you would any other liquid soap. You can apply CHG directly to the skin and wash gently with a scrungie or a clean washcloth.   Apply the CHG Soap to your body ONLY FROM THE NECK DOWN.  Do not use on open wounds or open sores. Avoid contact with your eyes, ears, mouth and genitals (private parts). Wash Face and genitals (private parts)  with your normal soap.   Wash thoroughly, paying special attention to the area where your surgery will be performed.  Thoroughly rinse your body with warm water from the neck down.  DO NOT shower/wash with your normal soap after using and rinsing off the CHG Soap.  Pat yourself dry with a CLEAN TOWEL.  Wear CLEAN PAJAMAS to bed the night before surgery  Place CLEAN SHEETS on your bed the night before your surgery  DO NOT SLEEP WITH PETS.   Day of Surgery:  Take a shower with CHG soap. Wear Clean/Comfortable clothing the morning of surgery Do not apply any deodorants/lotions.   Remember to brush your teeth WITH YOUR REGULAR  TOOTHPASTE.    If you received a COVID test during your pre-op visit, it is requested that you wear a mask when out in public, stay away from anyone that may not be feeling well, and notify your surgeon if you develop symptoms. If you have been in contact with anyone that has tested positive in the last 10 days, please notify your surgeon.    Please read over the following fact sheets that you were given.

## 2022-07-09 ENCOUNTER — Encounter (HOSPITAL_COMMUNITY)
Admission: RE | Admit: 2022-07-09 | Discharge: 2022-07-09 | Disposition: A | Discharge status: Home / Self Care | Payer: Medicare HMO | Source: Ambulatory Visit | Attending: Orthopaedic Surgery | Admitting: Orthopaedic Surgery

## 2022-07-09 ENCOUNTER — Encounter (HOSPITAL_COMMUNITY): Payer: Self-pay

## 2022-07-09 ENCOUNTER — Other Ambulatory Visit: Payer: Self-pay

## 2022-07-09 VITALS — BP 120/73 | HR 62 | Temp 98.2°F | Resp 18 | Ht 68.0 in | Wt 211.3 lb

## 2022-07-09 DIAGNOSIS — I5042 Chronic combined systolic (congestive) and diastolic (congestive) heart failure: Secondary | ICD-10-CM | POA: Insufficient documentation

## 2022-07-09 DIAGNOSIS — I252 Old myocardial infarction: Secondary | ICD-10-CM | POA: Diagnosis not present

## 2022-07-09 DIAGNOSIS — Z01818 Encounter for other preprocedural examination: Secondary | ICD-10-CM | POA: Insufficient documentation

## 2022-07-09 DIAGNOSIS — Z6832 Body mass index (BMI) 32.0-32.9, adult: Secondary | ICD-10-CM | POA: Insufficient documentation

## 2022-07-09 DIAGNOSIS — I130 Hypertensive heart and chronic kidney disease with heart failure and stage 1 through stage 4 chronic kidney disease, or unspecified chronic kidney disease: Secondary | ICD-10-CM | POA: Insufficient documentation

## 2022-07-09 DIAGNOSIS — I2510 Atherosclerotic heart disease of native coronary artery without angina pectoris: Secondary | ICD-10-CM | POA: Insufficient documentation

## 2022-07-09 DIAGNOSIS — N183 Chronic kidney disease, stage 3 unspecified: Secondary | ICD-10-CM | POA: Insufficient documentation

## 2022-07-09 DIAGNOSIS — E669 Obesity, unspecified: Secondary | ICD-10-CM | POA: Insufficient documentation

## 2022-07-09 DIAGNOSIS — M1611 Unilateral primary osteoarthritis, right hip: Secondary | ICD-10-CM

## 2022-07-09 DIAGNOSIS — E785 Hyperlipidemia, unspecified: Secondary | ICD-10-CM | POA: Diagnosis not present

## 2022-07-09 DIAGNOSIS — K219 Gastro-esophageal reflux disease without esophagitis: Secondary | ICD-10-CM | POA: Diagnosis not present

## 2022-07-09 DIAGNOSIS — G4733 Obstructive sleep apnea (adult) (pediatric): Secondary | ICD-10-CM | POA: Diagnosis not present

## 2022-07-09 HISTORY — DX: Cardiac arrhythmia, unspecified: I49.9

## 2022-07-09 HISTORY — DX: Depression, unspecified: F32.A

## 2022-07-09 HISTORY — DX: Unspecified osteoarthritis, unspecified site: M19.90

## 2022-07-09 LAB — COMPREHENSIVE METABOLIC PANEL
ALT: 19 U/L (ref 0–44)
AST: 23 U/L (ref 15–41)
Albumin: 3.8 g/dL (ref 3.5–5.0)
Alkaline Phosphatase: 89 U/L (ref 38–126)
Anion gap: 6 (ref 5–15)
BUN: 23 mg/dL (ref 8–23)
CO2: 25 mmol/L (ref 22–32)
Calcium: 9.2 mg/dL (ref 8.9–10.3)
Chloride: 108 mmol/L (ref 98–111)
Creatinine, Ser: 1.32 mg/dL — ABNORMAL HIGH (ref 0.61–1.24)
GFR, Estimated: 56 mL/min — ABNORMAL LOW (ref >60)
Glucose, Bld: 102 mg/dL — ABNORMAL HIGH (ref 70–99)
Potassium: 4 mmol/L (ref 3.5–5.1)
Sodium: 139 mmol/L (ref 135–145)
Total Bilirubin: 0.6 mg/dL (ref 0.3–1.2)
Total Protein: 6.6 g/dL (ref 6.5–8.1)

## 2022-07-09 LAB — CBC
HCT: 40.7 % (ref 39.0–52.0)
Hemoglobin: 13.7 g/dL (ref 13.0–17.0)
MCH: 33.8 pg (ref 26.0–34.0)
MCHC: 33.7 g/dL (ref 30.0–36.0)
MCV: 100.5 fL — ABNORMAL HIGH (ref 80.0–100.0)
Platelets: 137 10*3/uL — ABNORMAL LOW (ref 150–400)
RBC: 4.05 MIL/uL — ABNORMAL LOW (ref 4.22–5.81)
RDW: 12.4 % (ref 11.5–15.5)
WBC: 9.5 10*3/uL (ref 4.0–10.5)
nRBC: 0 % (ref 0.0–0.2)

## 2022-07-09 LAB — TYPE AND SCREEN
ABO/RH(D): O POS
Antibody Screen: NEGATIVE

## 2022-07-09 LAB — SURGICAL PCR SCREEN
MRSA, PCR: NEGATIVE
Staphylococcus aureus: POSITIVE — AB

## 2022-07-09 NOTE — Progress Notes (Signed)
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  History includes never smoker, HTN, dyslipidemia, CAD (DES x2 at LAD/DIAG bifurcation complicated by periprocedural MI due to LAD occlusion->s/p 3rd DES to LAD 11/05/05; PTCA Ramus & to overlapping LAD/DIAG stents 11/13/05), afib (new onset 07/16/16, unsuccessful DCCV 08/05/16, successful 08/08/16; radiofrequency catheter ablation 10/24/16), chronic combined systolic and diastolic CHF, OSA (uses BiPAP), CKD, GI bleed, GERD, exertional dyspnea, nephrolithiasis. BMI is consistent with obesity.  Preoperative cardiology input outlined by Christen Bame, NP on 05/21/22, "Preoperative Cardiovascular Risk Assessment: Patient is doing well from a cardiac perspective and may proceed to surgery without further testing. According to the Revised Cardiac Risk Index (RCRI), his Perioperative Risk of Major Cardiac Event is (%): 0.9.  His Functional Capacity in METs is: 6.18 according to the [facility] Activity Status Index (DASI).   Per office protocol, patient can hold Xarelto for 3 days prior to procedure."  He takes Xarelto 20 mg at bedtime, last dose would be 07/12/22 PM for full 72 hour off. Attempted to contact patient to confirm, but only got voicemail.   VS: BP 120/73   Pulse 62   Temp 36.8 C (Oral)   Resp 18   Ht '5\' 8"'  (1.727 m)   Wt 95.8 kg   SpO2 99%   BMI 32.13 kg/m    PROVIDERS: Koirala, [name], MD is PCP  - Candee Furbish, MD is cardiologist, although he is primarily followed by Dr. [name] and Dr. Claiborne Billings since 2018.  [name], Will, MD is EP cardiologist. Last visit 12/06/21. No known recurrent afib. EF 30-35% shortly after  diagnosis of afib, improved to 55-60% 11/2016 following afib ablation. CM felt likely non-ischemic. No chest pain. One year follow-up advised.  [name], MD is cardiologist (for OSA). Last visit 07/12/21. Overall compliant with BiPAP. New mask ordered for leak. One year follow-up planned.    LABS: Labs reviewed: Acceptable for surgery.  Known CKD--Cr 1.44, eGFR 50 on 05/15/21 ([place], scanned under Media tab). (all labs ordered are listed, but only abnormal results are displayed)  Labs Reviewed  SURGICAL PCR SCREEN - Abnormal; Notable for the following components:      Result Value   Staphylococcus aureus POSITIVE (*)    All other components within normal limits  CBC - Abnormal; Notable for the following components:   RBC 4.05 (*)    MCV 100.5 (*)    Platelets 137 (*)    All other components within normal limits  COMPREHENSIVE METABOLIC PANEL - Abnormal; Notable for the following components:   Glucose, Bld 102 (*)    Creatinine, Ser 1.32 (*)    GFR, Estimated 56 (*)    All other components within normal limits  TYPE AND SCREEN     EKG: 07/09/22: Sinus bradycardia at 58 bpm Left anterior fasicular block Nonspecific T wave abnormality Abnormal ECG When compared with ECG of 25-Nov-2016 08:31, No significant change since last tracing Confirmed by Gwyndolyn Kaufman [phone]) on 07/09/2022 12:59:30 PM   CV: Echo 11/22/16: Study Conclusions  - Left ventricle: The cavity size was normal. Wall thickness was    increased in a pattern  of mild LVH. Systolic function was normal.    The estimated ejection fraction was in the range of 55% to 60%.    Left ventricular diastolic function parameters were normal.  - Mitral valve: Calcified annulus. Mildly thickened leaflets .  - Left atrium: The atrium was mildly dilated.  - Atrial septum: No defect or patent foramen ovale was identified.  - Comparison: 08/05/16 EF 30-35% with diffuse LV hypokinesis; 07/17/16 EF 55-60%   CT cardiac  morphology 10/23/16:  IMPRESSION: 1) No LAA thrombus 2) Normal pulmonary veins with no anomaly measurements as above 3) Moderate biatrial enlargement 4) No pericardial effusion 5) Esophagus courses closest to the LLPV ostium    Nuclear stress test 01/06/13:  - Normal stress nuclear study. - LV Ejection Fraction: 57%.  LV Wall Motion:  NL LV Function; NL Wall Motion    Cardiac cath 11/13/05:  RESULTS: 1.  Initially, the LAD stent appeared to be widely patent.  The diagonal branch had TIMI-III flow with ostium that appeared to be 80% narrowed. The IVUS measurements in the stent showed that the distal reference was about 3.0, and there were areas within the stent where the stent diameter was about 2.2-2.5.  For this reason, we elected to post-dilate this.  We did not perform a final IVUS after the post-dilatation, but angiographically it looked quite good. 2.  The stenosis in the ramus branch was initially estimated at 80% and following cutting balloon angioplasty, this improved to less than 20%. CONCLUSIONS: 1.  Successful touch up post-dilatation of the three tandem overlying Cypher stents in the proximal to mid-LAD based on IVUS measurements with 0% stenosis angiographically at the end of the procedure and a patent diagonal branch with TIMI-III flow. 2.  Successful cutting balloon angioplasty of the ramus branch of the circumflex artery with improvement in percent narrowing from 80% to less than 20%. DISPOSITION: The patient will remain on Plavix long term with three overlapping drug-eluting stents in the LAD. - Plavix discontinued 07/26/16 after he developed a GI bleed while on both Plavix and Xarelto (which was started ~ 07/16/16 due to afib)   Past Medical History:  Diagnosis Date   Arthritis    At risk for sleep apnea    STOP-BANG= 5       SENT TO PCP 07-12-2015   CAD (coronary artery disease)    a. s/p PCI with DES x 3 to LAD in 2006 with bifurcation lesion and Plavix indefinitely was  recommended.   Chronic combined systolic and diastolic CHF (congestive heart failure) ([place])    a. Prior EF normal but EF dropped to 30-35% in 07/2016 in setting of AFIB.   CKD (chronic kidney disease), stage III (HCC)    Depression    Dyslipidemia    Dyspnea    tired quick d/t Afib   Dyspnea on exertion    Dysrhythmia    A.FIB   GERD (gastroesophageal reflux disease)    History of GI bleed    History of kidney stones    History of MI (myocardial infarction)    11-05-2005--  periprocedural MI  (cardiac cath) per dr [name] note   Hypertension    Hypertriglyceridemia    Iron deficiency anemia    Myocardial infarction [facility])    Persistent atrial fibrillation ([place])    a. 07/2016, started on Eliquis -> readmitted later that month for AF RVR/acute HF - initially unsuccessful DCCV following TEE, so loaded with amio with repeat successful DCCV 07/2816.   Right  ureteral stone    S/P drug eluting coronary stent placement    x3  to LAD  2006   Sleep apnea    wears BIPAP, 14 up to 24    Past Surgical History:  Procedure Laterality Date   CARDIOVASCULAR STRESS TEST  01-06-2013  dr [name]   normal perfusion study/  no ischemia or infarct/  normal LV function and wall motion , ef 57%   CARDIOVERSION N/A 08/05/2016   Procedure: CARDIOVERSION;  Surgeon: Sanda Klein, MD;  Location: [place];  Service: Cardiovascular;  Laterality: N/A;   CARDIOVERSION N/A 08/08/2016   Procedure: CARDIOVERSION;  Surgeon: Jerline Pain, MD;  Location: [place];  Service: Cardiovascular;  Laterality: N/A;   CORONARY ANGIOPLASTY  11-13-2005 dr [name]   Successful touch up post-dilatation of the 3 tandem overlying Cypher stents in proximal to mid LAD (based on IVUS 0% stenosis and patent diagonal branch)/  Cutting Balloon Angioplasty of Ramus branch   CORONARY ANGIOPLASTY WITH STENT PLACEMENT  11-05-2005   dr [name]   PCI and DES x3 to birfurcation lesion LAD and diagonal branch of LAD/   20% dLM,  30-40%  RCA, ef 60%   CYSTOSCOPY WITH RETROGRADE PYELOGRAM, URETEROSCOPY AND STENT PLACEMENT Right 07/14/2015   Procedure: [place], URETEROSCOPY AND STENT PLACEMENT;  Surgeon: Alexis Frock, MD;  Location: [facility];  Service: Urology;  Laterality: Right;   ELECTROPHYSIOLOGIC STUDY N/A 10/24/2016   Procedure: Atrial Fibrillation Ablation;  Surgeon: Will Meredith Leeds, MD;  Location: [place] CV LAB;  Service: Cardiovascular;  Laterality: N/A;   EXTRACORPOREAL SHOCK WAVE LITHOTRIPSY  yrs ago   HEMORRHOID SURGERY N/A 10/24/2017   Procedure: HEMORRHOIDECTOMY;  Surgeon: Ileana Roup, MD;  Location: [place] OR;  Service: General;  Laterality: N/A;   HERNIA REPAIR Bilateral 2000   Inguinal Hernia   IR GENERIC HISTORICAL  10/23/2016   IR FLUORO GUIDE CV LINE RIGHT 10/23/2016 Greggory Keen, MD [facility]   IR GENERIC HISTORICAL  10/23/2016   IR US GUIDE VASC ACCESS RIGHT 10/23/2016 Greggory Keen, MD [facility]   LEFT URETEROSCOPIC STONE EXTRACTION  04/10/2006   STONE EXTRACTION WITH BASKET Right 07/14/2015   Procedure: STONE EXTRACTION WITH BASKET;  Surgeon: Alexis Frock, MD;  Location: [facility];  Service: Urology;  Laterality: Right;   TEE WITHOUT CARDIOVERSION N/A 08/05/2016   Procedure: TRANSESOPHAGEAL ECHOCARDIOGRAM (TEE);  Surgeon: Sanda Klein, MD;  Location: [facility] ENDOSCOPY;  Service: Cardiovascular;  Laterality: N/A;   TRANSTHORACIC ECHOCARDIOGRAM  02/04/2012   grade I diastolic dysfunction/  ef 55-60%/  trivial MR and TR/  mild LAE    MEDICATIONS:  allopurinol (ZYLOPRIM) 100 MG tablet   allopurinol (ZYLOPRIM) 300 MG tablet   atorvastatin (LIPITOR) 40 MG tablet   carvedilol (COREG) 25 MG tablet   diphenhydramine-acetaminophen (TYLENOL PM) 25-500 MG TABS tablet   docusate sodium (COLACE) 100 MG capsule   ferrous sulfate 325 (65 FE) MG tablet   furosemide (LASIX) 40 MG tablet   HYDROcodone-acetaminophen (NORCO) 5-325  MG tablet   lisinopril (ZESTRIL) 40 MG tablet   methocarbamol (ROBAXIN) 500 MG tablet   nitroGLYCERIN (NITROSTAT) 0.4 MG SL tablet   ondansetron (ZOFRAN) 4 MG tablet   oxyCODONE-acetaminophen (PERCOCET) 5-325 MG tablet   pantoprazole (PROTONIX) 40 MG tablet   polyvinyl alcohol (LIQUIFILM TEARS) 1.4 % ophthalmic solution   potassium chloride SA (KLOR-CON M) 20 MEQ tablet   sertraline (ZOLOFT) 25 MG tablet   spironolactone (ALDACTONE) 25 MG tablet  XARELTO 20 MG TABS tablet    triamcinolone acetonide (KENALOG) 10 MG/ML injection 10 mg   triamcinolone acetonide (KENALOG) 10 MG/ML injection 10 mg    Myra Gianotti, PA-C Surgical Short Stay/Anesthesiology [facility] Phone [phone] [facility] Phone [phone] 07/09/2022 6:36 PM

## 2022-07-09 NOTE — Pre-Procedure Instructions (Signed)
Surgical Instructions    Your procedure is scheduled on July 16, 2022.  Report to [facility] Main Entrance "A" at 5:30 A.M., then check in with the Admitting office.  Call this number if you have problems the morning of surgery:  [phone]   If you have any questions prior to your surgery date call [phone]: Open Monday-Friday 8am-4pm    Remember:  Do not eat after midnight the night before your surgery  You may drink clear liquids until 4:30 AM the morning of your surgery.   Clear liquids allowed are: Water, Non-Citrus Juices (without pulp), Carbonated Beverages, Clear Tea, Black Coffee Only (NO MILK, CREAM OR POWDERED CREAMER of any kind), and Gatorade.  Patient Instructions  The night before surgery:  No food after midnight. ONLY clear liquids after midnight  The day of surgery (if you do NOT have diabetes):  Drink ONE (1) Pre-Surgery Clear Ensure by 4:30 AM the morning of surgery. Drink in one sitting. Do not sip.  This drink was given to you during your hospital  pre-op appointment visit.  Nothing else to drink after completing the  Pre-Surgery Clear Ensure.         If you have questions, please contact your surgeon's office.     Take these medicines the morning of surgery with A SIP OF WATER:  allopurinol (ZYLOPRIM  atorvastatin (LIPITOR)  carvedilol (COREG)  pantoprazole (PROTONIX)   sertraline (ZOLOFT)   polyvinyl alcohol (LIQUIFILM TEARS)  ferrous sulfate 325 (65 FE)   Take these medicines the morning of surgery AS NEEDED:  docusate sodium (COLACE)   nitroGLYCERIN (NITROSTAT)  ondansetron (ZOFRAN)     As of today, STOP taking any Aspirin (unless otherwise instructed by your surgeon) Aleve, Naproxen, Ibuprofen, Motrin, Advil, Goody's, BC's, all herbal medications, fish oil, and all vitamins.                     Do NOT Smoke (Tobacco/Vaping) for 24 hours prior to your procedure.  If you use a CPAP at night, you may bring your mask/headgear for  your overnight stay.   Contacts, glasses, piercing's, hearing aid's, dentures or partials may not be worn into surgery, please bring cases for these belongings.    For patients admitted to the hospital, discharge time will be determined by your treatment team.   Patients discharged the day of surgery will not be allowed to drive home, and someone needs to stay with them for 24 hours.  SURGICAL WAITING ROOM VISITATION Patients having surgery or a procedure may have no more than 2 support people in the waiting area - these visitors may rotate.   Children under the age of 64 must have an adult with them who is not the patient. If the patient needs to stay at the hospital during part of their recovery, the visitor guidelines for inpatient rooms apply. Pre-op nurse will coordinate an appropriate time for 1 support person to accompany patient in pre-op.  This support person may not rotate.   Please refer to the [facility] website for the visitor guidelines for Inpatients (after your surgery is over and you are in a regular room).    Special instructions:   [place]- Preparing For Surgery  Before surgery, you can play an important role. Because skin is not sterile, your skin needs to be as free of germs as possible. You can reduce the number of germs on your skin by washing with CHG (chlorahexidine gluconate) Soap before surgery.  CHG  is an antiseptic cleaner which kills germs and bonds with the skin to continue killing germs even after washing.    Oral Hygiene is also important to reduce your risk of infection.  Remember - BRUSH YOUR TEETH THE MORNING OF SURGERY WITH YOUR REGULAR TOOTHPASTE  Please do not use if you have an allergy to CHG or antibacterial soaps. If your skin becomes reddened/irritated stop using the CHG.  Do not shave (including legs and underarms) for at least 48 hours prior to first CHG shower. It is OK to shave your face.  Please follow these instructions  carefully.   Shower the NIGHT BEFORE SURGERY and the MORNING OF SURGERY  If you chose to wash your hair, wash your hair first as usual with your normal shampoo.  After you shampoo, rinse your hair and body thoroughly to remove the shampoo.  Use CHG Soap as you would any other liquid soap. You can apply CHG directly to the skin and wash gently with a scrungie or a clean washcloth.   Apply the CHG Soap to your body ONLY FROM THE NECK DOWN.  Do not use on open wounds or open sores. Avoid contact with your eyes, ears, mouth and genitals (private parts). Wash Face and genitals (private parts)  with your normal soap.   Wash thoroughly, paying special attention to the area where your surgery will be performed.  Thoroughly rinse your body with warm water from the neck down.  DO NOT shower/wash with your normal soap after using and rinsing off the CHG Soap.  Pat yourself dry with a CLEAN TOWEL.  Wear CLEAN PAJAMAS to bed the night before surgery  Place CLEAN SHEETS on your bed the night before your surgery  DO NOT SLEEP WITH PETS.   Day of Surgery: Take a shower with CHG soap. Do not wear jewelry or makeup Do not wear lotions, powders, colognes, or deodorant. Do not shave 48 hours prior to surgery.  Men may shave face and neck. Do not bring valuables to the hospital.  [facility] is not responsible for any belongings or valuables. Do not wear nail polish, gel polish, artificial nails, or any other type of covering on natural nails (fingers and toes) If you have artificial nails or gel coating that need to be removed by a nail salon, please have this removed prior to surgery. Artificial nails or gel coating may interfere with anesthesia's ability to adequately monitor your vital signs.  Wear Clean/Comfortable clothing the morning of surgery Remember to brush your teeth WITH YOUR REGULAR TOOTHPASTE.   Please read over the following fact sheets that you were given.    If you received  a COVID test during your pre-op visit  it is requested that you wear a mask when out in public, stay away from anyone that may not be feeling well and notify your surgeon if you develop symptoms. If you have been in contact with anyone that has tested positive in the last 10 days please notify you surgeon.

## 2022-07-09 NOTE — Progress Notes (Signed)
PCP - Dr. Lauretta Grill [place] Cardiologist - Dr. [name]  PPM/ICD - Denies Device Orders - n/a Rep Notified - n/a  Chest x-ray - 07/16/2016 EKG - 07/09/2022 Stress Test - 01/06/2013 ECHO - 11/12/2016 Cardiac Cath - 2006 - 3 stents placed  Sleep Study - 07/15/2019 Pt wears BiPAP every night. Pressure setting 12-25  No DM  Blood Thinner Instructions: Per pt, Dr. Erlinda Hong said to stop Xarelto 3 days prior to surgery. Per Cardiac Clearance note, pt is to stop Xarelto 3 days prior to surgery. Pt never heard back from Cardiologist on when to stop. Instructions given to patient at PAT appointment to contact Dr. [name] office today and verify when to stop taking Xarelto. Patient understood instructions. Aspirin Instructions: n/a  ERAS Protcol - Yes. Clear liquids until 0430 morning of surgery. PRE-SURGERY Ensure or G2- Yes. Ensure given to patient at PAT appointment.  COVID TEST- n/a   Anesthesia review: Yes. Cardiac Hx with MI. Cardiac Clearance note in Epic dated 05/21/2022.   Patient denies shortness of breath, fever, cough and chest pain at PAT appointment   All instructions explained to the patient, with a verbal understanding of the material. Patient agrees to go over the instructions while at home for a better understanding. Patient also instructed to self quarantine after being tested for COVID-19. The opportunity to ask questions was provided.

## 2022-07-10 NOTE — Anesthesia Preprocedure Evaluation (Addendum)
Anesthesia Evaluation  Patient identified by MRN, date of birth, ID band Patient awake    Reviewed: Allergy & Precautions, NPO status , Patient's Chart, lab work & pertinent test results  Airway Mallampati: III  TM Distance: >3 FB Neck ROM: Full    Dental  (+) Teeth Intact, Dental Advisory Given   Pulmonary sleep apnea and Continuous Positive Airway Pressure Ventilation ,    Pulmonary exam normal breath sounds clear to auscultation       Cardiovascular hypertension, Pt. on home beta blockers and Pt. on medications (-) angina+ CAD, + Past MI, + Cardiac Stents and +CHF  Normal cardiovascular exam+ dysrhythmias Atrial Fibrillation  Rhythm:Regular Rate:Normal  Echo 11/22/2016: - Left ventricle: The cavity size was normal. Wall thickness was  increased in a pattern of mild LVH. Systolic function was normal.  The estimated ejection fraction was in the range of 55% to 60%.  Left ventricular diastolic function parameters were normal.  - Mitral valve: Calcified annulus. Mildly thickened leaflets .  - Left atrium: The atrium was mildly dilated.  - Atrial septum: No defect or patent foramen ovale was identified.    Neuro/Psych PSYCHIATRIC DISORDERS Depression negative neurological ROS     GI/Hepatic Neg liver ROS, GERD  Medicated,  Endo/Other  Obesity   Renal/GU Renal InsufficiencyRenal disease     Musculoskeletal  (+) Arthritis ,   Abdominal   Peds  Hematology  (+) Blood dyscrasia (Xarelto; PLT 137k), ,   Anesthesia Other Findings Day of surgery medications reviewed with the patient.  Reproductive/Obstetrics                           Anesthesia Physical Anesthesia Plan  ASA: 3  Anesthesia Plan: Spinal   Post-op Pain Management: Tylenol PO (pre-op)*   Induction: Intravenous  PONV Risk Score and Plan: 1 and TIVA, Treatment may vary due to age or medical condition, Dexamethasone and  Ondansetron  Airway Management Planned: Natural Airway and Simple Face Mask  Additional Equipment:   Intra-op Plan:   Post-operative Plan:   Informed Consent: I have reviewed the patients History and Physical, chart, labs and discussed the procedure including the risks, benefits and alternatives for the proposed anesthesia with the patient or authorized representative who has indicated his/her understanding and acceptance.     Dental advisory given  Plan Discussed with: CRNA  Anesthesia Plan Comments: (PAT note written 07/10/2022 by Myra Gianotti, PA-C. )      Anesthesia Quick Evaluation

## 2022-07-12 ENCOUNTER — Telehealth: Payer: Self-pay | Admitting: *Deleted

## 2022-07-12 NOTE — Telephone Encounter (Signed)
Attempted Ortho bundle pre-op call to patient; no answer and left VM requesting call back. 

## 2022-07-15 ENCOUNTER — Telehealth: Payer: Self-pay | Admitting: *Deleted

## 2022-07-15 NOTE — Telephone Encounter (Signed)
Ortho bundle pre-op call completed. 

## 2022-07-15 NOTE — Care Plan (Signed)
[facility] RNCM call to patient to discuss his upcoming Right total hip arthroplasty with Dr. Erlinda Hong on 07/16/22. He is an Ortho bundle patient through [facility] and is agreeable to case management. His wife will be assisting after discharge. He has a RW and 3in1/BSC. No further DME is needed. Anticipate HHPT will be needed after a short hospital stay. Referral made to [facility] after choice provided. Reviewed all post op care instructions. Will continue to follow for CM needs.

## 2022-07-16 ENCOUNTER — Encounter (HOSPITAL_COMMUNITY): Payer: Self-pay | Admitting: Orthopaedic Surgery

## 2022-07-16 ENCOUNTER — Encounter (HOSPITAL_COMMUNITY)
Admission: RE | Disposition: A | Discharge status: Home / Self Care | Payer: Self-pay | Source: Home / Self Care | Attending: Orthopaedic Surgery

## 2022-07-16 ENCOUNTER — Observation Stay (HOSPITAL_COMMUNITY)
Admission: RE | Admit: 2022-07-16 | Discharge: 2022-07-17 | Disposition: A | Discharge status: Home / Self Care | Payer: Medicare HMO | Attending: Orthopaedic Surgery | Admitting: Orthopaedic Surgery

## 2022-07-16 ENCOUNTER — Ambulatory Visit (HOSPITAL_BASED_OUTPATIENT_CLINIC_OR_DEPARTMENT_OTHER): Payer: Medicare HMO | Admitting: Anesthesiology

## 2022-07-16 ENCOUNTER — Ambulatory Visit (HOSPITAL_COMMUNITY): Payer: Medicare HMO

## 2022-07-16 ENCOUNTER — Observation Stay (HOSPITAL_COMMUNITY): Payer: Medicare HMO

## 2022-07-16 ENCOUNTER — Other Ambulatory Visit: Payer: Self-pay

## 2022-07-16 ENCOUNTER — Ambulatory Visit (HOSPITAL_COMMUNITY): Payer: Medicare HMO | Admitting: Vascular Surgery

## 2022-07-16 DIAGNOSIS — M1611 Unilateral primary osteoarthritis, right hip: Secondary | ICD-10-CM

## 2022-07-16 DIAGNOSIS — I4819 Other persistent atrial fibrillation: Secondary | ICD-10-CM | POA: Insufficient documentation

## 2022-07-16 DIAGNOSIS — I2510 Atherosclerotic heart disease of native coronary artery without angina pectoris: Secondary | ICD-10-CM

## 2022-07-16 DIAGNOSIS — Z955 Presence of coronary angioplasty implant and graft: Secondary | ICD-10-CM | POA: Diagnosis not present

## 2022-07-16 DIAGNOSIS — I130 Hypertensive heart and chronic kidney disease with heart failure and stage 1 through stage 4 chronic kidney disease, or unspecified chronic kidney disease: Secondary | ICD-10-CM | POA: Insufficient documentation

## 2022-07-16 DIAGNOSIS — Z471 Aftercare following joint replacement surgery: Secondary | ICD-10-CM | POA: Diagnosis not present

## 2022-07-16 DIAGNOSIS — I509 Heart failure, unspecified: Secondary | ICD-10-CM

## 2022-07-16 DIAGNOSIS — Z79899 Other long term (current) drug therapy: Secondary | ICD-10-CM | POA: Insufficient documentation

## 2022-07-16 DIAGNOSIS — I5042 Chronic combined systolic (congestive) and diastolic (congestive) heart failure: Secondary | ICD-10-CM | POA: Diagnosis not present

## 2022-07-16 DIAGNOSIS — I252 Old myocardial infarction: Secondary | ICD-10-CM

## 2022-07-16 DIAGNOSIS — N289 Disorder of kidney and ureter, unspecified: Secondary | ICD-10-CM

## 2022-07-16 DIAGNOSIS — N183 Chronic kidney disease, stage 3 unspecified: Secondary | ICD-10-CM | POA: Diagnosis not present

## 2022-07-16 DIAGNOSIS — I110 Hypertensive heart disease with heart failure: Secondary | ICD-10-CM

## 2022-07-16 DIAGNOSIS — Z96641 Presence of right artificial hip joint: Secondary | ICD-10-CM | POA: Diagnosis not present

## 2022-07-16 DIAGNOSIS — M169 Osteoarthritis of hip, unspecified: Secondary | ICD-10-CM | POA: Diagnosis present

## 2022-07-16 HISTORY — PX: TOTAL HIP ARTHROPLASTY: SHX124

## 2022-07-16 SURGERY — ARTHROPLASTY, HIP, TOTAL, ANTERIOR APPROACH
Anesthesia: Spinal | Site: Hip | Laterality: Right

## 2022-07-16 MED ORDER — ORAL CARE MOUTH RINSE: 15.0000 mL | Freq: Once | OROMUCOSAL | Status: AC

## 2022-07-16 MED ORDER — SORBITOL 70 % SOLN
30.0000 mL | Freq: Every day | Status: DC | PRN
Start: 2022-07-16 — End: 2022-07-17

## 2022-07-16 MED ORDER — LACTATED RINGERS IV SOLN: INTRAVENOUS | Status: DC

## 2022-07-16 MED ORDER — CEFAZOLIN SODIUM-DEXTROSE 2-4 GM/100ML-% IV SOLN
2.0000 g | INTRAVENOUS | Status: AC
Start: 2022-07-16 — End: 2022-07-16
  Administered 2022-07-16: 2 g via INTRAVENOUS
  Filled 2022-07-16: qty 100

## 2022-07-16 MED ORDER — PRONTOSAN WOUND IRRIGATION OPTIME
TOPICAL | Status: DC | PRN
  Administered 2022-07-16: 1 via TOPICAL

## 2022-07-16 MED ORDER — VANCOMYCIN HCL 1 G IV SOLR
INTRAVENOUS | Status: DC | PRN
  Administered 2022-07-16: 1000 mg

## 2022-07-16 MED ORDER — ALUM & MAG HYDROXIDE-SIMETH 200-200-20 MG/5ML PO SUSP: 30.0000 mL | ORAL | Status: DC | PRN

## 2022-07-16 MED ORDER — OXYCODONE HCL ER 10 MG PO T12A
10.0000 mg | EXTENDED_RELEASE_TABLET | Freq: Two times a day (BID) | ORAL | Status: DC
  Administered 2022-07-16 – 2022-07-17 (×2): 10 mg via ORAL
  Filled 2022-07-16 (×2): qty 1

## 2022-07-16 MED ORDER — OXYCODONE HCL 5 MG PO TABS
ORAL_TABLET | ORAL | Status: AC
  Filled 2022-07-16: qty 1

## 2022-07-16 MED ORDER — METHOCARBAMOL 500 MG PO TABS
500.0000 mg | ORAL_TABLET | Freq: Four times a day (QID) | ORAL | Status: DC | PRN
  Administered 2022-07-16 – 2022-07-17 (×3): 500 mg via ORAL
  Filled 2022-07-16 (×3): qty 1

## 2022-07-16 MED ORDER — STERILE WATER FOR IRRIGATION IR SOLN
Status: DC | PRN
  Administered 2022-07-16: 1000 mL

## 2022-07-16 MED ORDER — TRANEXAMIC ACID-NACL 1000-0.7 MG/100ML-% IV SOLN
1000.0000 mg | Freq: Once | INTRAVENOUS | Status: AC
  Administered 2022-07-16: 1000 mg via INTRAVENOUS
  Filled 2022-07-16 (×2): qty 100

## 2022-07-16 MED ORDER — OXYCODONE HCL 5 MG PO TABS
10.0000 mg | ORAL_TABLET | ORAL | Status: DC | PRN
  Administered 2022-07-16 – 2022-07-17 (×3): 10 mg via ORAL
  Filled 2022-07-16: qty 2

## 2022-07-16 MED ORDER — PHENYLEPHRINE HCL-NACL 20-0.9 MG/250ML-% IV SOLN
INTRAVENOUS | Status: DC | PRN
  Administered 2022-07-16: 30 ug/min via INTRAVENOUS
  Administered 2022-07-16: 80 ug via INTRAVENOUS

## 2022-07-16 MED ORDER — METOCLOPRAMIDE HCL 5 MG/ML IJ SOLN: 5.0000 mg | Freq: Three times a day (TID) | INTRAMUSCULAR | Status: DC | PRN

## 2022-07-16 MED ORDER — LIDOCAINE 2% (20 MG/ML) 5 ML SYRINGE
INTRAMUSCULAR | Status: AC
Start: 2022-07-16 — End: ?
  Filled 2022-07-16: qty 5

## 2022-07-16 MED ORDER — BUPIVACAINE-MELOXICAM ER 400-12 MG/14ML IJ SOLN
INTRAMUSCULAR | Status: AC
  Filled 2022-07-16: qty 1

## 2022-07-16 MED ORDER — VASOPRESSIN 20 UNIT/ML IV SOLN
INTRAVENOUS | Status: AC
  Filled 2022-07-16: qty 1

## 2022-07-16 MED ORDER — SPIRONOLACTONE 25 MG PO TABS
25.0000 mg | ORAL_TABLET | Freq: Every day | ORAL | Status: DC
  Administered 2022-07-17: 25 mg via ORAL
  Filled 2022-07-16: qty 1

## 2022-07-16 MED ORDER — DIPHENHYDRAMINE HCL 12.5 MG/5ML PO ELIX: 25.0000 mg | ORAL_SOLUTION | ORAL | Status: DC | PRN

## 2022-07-16 MED ORDER — PROPOFOL 500 MG/50ML IV EMUL
INTRAVENOUS | Status: DC | PRN
  Administered 2022-07-16: 20 mg via INTRAVENOUS
  Administered 2022-07-16: 50 ug/kg/min via INTRAVENOUS
  Administered 2022-07-16: 20 mg via INTRAVENOUS

## 2022-07-16 MED ORDER — OXYCODONE HCL 5 MG PO TABS
5.0000 mg | ORAL_TABLET | ORAL | Status: DC | PRN
  Administered 2022-07-16: 5 mg via ORAL
  Filled 2022-07-16 (×2): qty 2

## 2022-07-16 MED ORDER — METOCLOPRAMIDE HCL 5 MG PO TABS: 5.0000 mg | ORAL_TABLET | Freq: Three times a day (TID) | ORAL | Status: DC | PRN

## 2022-07-16 MED ORDER — POVIDONE-IODINE 10 % EX SWAB
2.0000 | Freq: Once | CUTANEOUS | Status: AC
  Administered 2022-07-16: 2 via TOPICAL

## 2022-07-16 MED ORDER — RIVAROXABAN 20 MG PO TABS
20.0000 mg | ORAL_TABLET | Freq: Every day | ORAL | Status: DC
  Administered 2022-07-17: 20 mg via ORAL
  Filled 2022-07-16: qty 1

## 2022-07-16 MED ORDER — PANTOPRAZOLE SODIUM 40 MG PO TBEC
40.0000 mg | DELAYED_RELEASE_TABLET | Freq: Every day | ORAL | Status: DC
  Administered 2022-07-16 – 2022-07-17 (×2): 40 mg via ORAL
  Filled 2022-07-16 (×2): qty 1

## 2022-07-16 MED ORDER — TRANEXAMIC ACID 1000 MG/10ML IV SOLN
2000.0000 mg | INTRAVENOUS | Status: DC
  Filled 2022-07-16: qty 20

## 2022-07-16 MED ORDER — ACETAMINOPHEN 325 MG PO TABS: 325.0000 mg | ORAL_TABLET | Freq: Four times a day (QID) | ORAL | Status: DC | PRN

## 2022-07-16 MED ORDER — MENTHOL 3 MG MT LOZG: 1.0000 | LOZENGE | OROMUCOSAL | Status: DC | PRN

## 2022-07-16 MED ORDER — PHENYLEPHRINE 80 MCG/ML (10ML) SYRINGE FOR IV PUSH (FOR BLOOD PRESSURE SUPPORT)
PREFILLED_SYRINGE | INTRAVENOUS | Status: AC
  Filled 2022-07-16: qty 10

## 2022-07-16 MED ORDER — FENTANYL CITRATE (PF) 100 MCG/2ML IJ SOLN: 25.0000 ug | INTRAMUSCULAR | Status: DC | PRN

## 2022-07-16 MED ORDER — PHENOL 1.4 % MT LIQD: 1.0000 | OROMUCOSAL | Status: DC | PRN

## 2022-07-16 MED ORDER — BUPIVACAINE-MELOXICAM ER 400-12 MG/14ML IJ SOLN
INTRAMUSCULAR | Status: DC | PRN
  Administered 2022-07-16: 400 mg

## 2022-07-16 MED ORDER — HYDROMORPHONE HCL 1 MG/ML IJ SOLN: 0.5000 mg | INTRAMUSCULAR | Status: DC | PRN

## 2022-07-16 MED ORDER — ONDANSETRON HCL 4 MG/2ML IJ SOLN
4.0000 mg | Freq: Once | INTRAMUSCULAR | Status: DC | PRN
Start: 2022-07-16 — End: 2022-07-16

## 2022-07-16 MED ORDER — ACETAMINOPHEN 500 MG PO TABS
1000.0000 mg | ORAL_TABLET | Freq: Four times a day (QID) | ORAL | Status: AC
  Administered 2022-07-16 – 2022-07-17 (×3): 1000 mg via ORAL
  Filled 2022-07-16 (×3): qty 2

## 2022-07-16 MED ORDER — FENTANYL CITRATE (PF) 250 MCG/5ML IJ SOLN
INTRAMUSCULAR | Status: AC
  Filled 2022-07-16: qty 5

## 2022-07-16 MED ORDER — TRANEXAMIC ACID-NACL 1000-0.7 MG/100ML-% IV SOLN
1000.0000 mg | INTRAVENOUS | Status: AC
  Administered 2022-07-16: 1000 mg via INTRAVENOUS
  Filled 2022-07-16: qty 100

## 2022-07-16 MED ORDER — METHOCARBAMOL 1000 MG/10ML IJ SOLN: 500.0000 mg | Freq: Four times a day (QID) | INTRAVENOUS | Status: DC | PRN

## 2022-07-16 MED ORDER — CARVEDILOL 25 MG PO TABS
25.0000 mg | ORAL_TABLET | Freq: Two times a day (BID) | ORAL | Status: DC
  Administered 2022-07-16 – 2022-07-17 (×2): 25 mg via ORAL
  Filled 2022-07-16 (×2): qty 1

## 2022-07-16 MED ORDER — CEFAZOLIN SODIUM-DEXTROSE 2-4 GM/100ML-% IV SOLN
2.0000 g | Freq: Four times a day (QID) | INTRAVENOUS | Status: AC
  Administered 2022-07-16 (×2): 2 g via INTRAVENOUS
  Filled 2022-07-16 (×2): qty 100

## 2022-07-16 MED ORDER — DOCUSATE SODIUM 100 MG PO CAPS
100.0000 mg | ORAL_CAPSULE | Freq: Two times a day (BID) | ORAL | Status: DC
  Administered 2022-07-16 – 2022-07-17 (×3): 100 mg via ORAL
  Filled 2022-07-16 (×3): qty 1

## 2022-07-16 MED ORDER — VANCOMYCIN HCL 1000 MG IV SOLR
INTRAVENOUS | Status: AC
  Filled 2022-07-16: qty 20

## 2022-07-16 MED ORDER — POLYETHYLENE GLYCOL 3350 17 G PO PACK
17.0000 g | PACK | Freq: Every day | ORAL | Status: DC
  Administered 2022-07-16 – 2022-07-17 (×2): 17 g via ORAL
  Filled 2022-07-16 (×2): qty 1

## 2022-07-16 MED ORDER — ONDANSETRON HCL 4 MG/2ML IJ SOLN
INTRAMUSCULAR | Status: AC
  Filled 2022-07-16: qty 2

## 2022-07-16 MED ORDER — ONDANSETRON HCL 4 MG/2ML IJ SOLN
4.0000 mg | Freq: Four times a day (QID) | INTRAMUSCULAR | Status: DC | PRN
  Administered 2022-07-16: 4 mg via INTRAVENOUS
  Filled 2022-07-16: qty 2

## 2022-07-16 MED ORDER — ONDANSETRON HCL 4 MG/2ML IJ SOLN
4.0000 mg | Freq: Once | INTRAMUSCULAR | Status: AC | PRN
  Administered 2022-07-16: 4 mg via INTRAVENOUS

## 2022-07-16 MED ORDER — 0.9 % SODIUM CHLORIDE (POUR BTL) OPTIME
TOPICAL | Status: DC | PRN
  Administered 2022-07-16: 1000 mL

## 2022-07-16 MED ORDER — CHLORHEXIDINE GLUCONATE 0.12 % MT SOLN
15.0000 mL | Freq: Once | OROMUCOSAL | Status: AC
  Administered 2022-07-16: 15 mL via OROMUCOSAL
  Filled 2022-07-16: qty 15

## 2022-07-16 MED ORDER — ACETAMINOPHEN 500 MG PO TABS
1000.0000 mg | ORAL_TABLET | Freq: Once | ORAL | Status: AC
  Administered 2022-07-16: 1000 mg via ORAL
  Filled 2022-07-16: qty 2

## 2022-07-16 MED ORDER — BUPIVACAINE IN DEXTROSE 0.75-8.25 % IT SOLN
INTRATHECAL | Status: DC | PRN
  Administered 2022-07-16: 1.8 mL via INTRATHECAL

## 2022-07-16 MED ORDER — EPHEDRINE 5 MG/ML INJ
INTRAVENOUS | Status: AC
  Filled 2022-07-16: qty 5

## 2022-07-16 MED ORDER — HYDROXYZINE HCL 50 MG/ML IM SOLN
50.0000 mg | Freq: Four times a day (QID) | INTRAMUSCULAR | Status: DC | PRN
  Administered 2022-07-16: 50 mg via INTRAMUSCULAR
  Filled 2022-07-16: qty 1

## 2022-07-16 MED ORDER — EPHEDRINE SULFATE-NACL 50-0.9 MG/10ML-% IV SOSY
PREFILLED_SYRINGE | INTRAVENOUS | Status: DC | PRN
  Administered 2022-07-16: 5 mg via INTRAVENOUS
  Administered 2022-07-16: 10 mg via INTRAVENOUS
  Administered 2022-07-16: 5 mg via INTRAVENOUS

## 2022-07-16 MED ORDER — ONDANSETRON HCL 4 MG PO TABS: 4.0000 mg | ORAL_TABLET | Freq: Four times a day (QID) | ORAL | Status: DC | PRN

## 2022-07-16 MED ORDER — MAGNESIUM CITRATE PO SOLN: 1.0000 | Freq: Once | ORAL | Status: DC | PRN

## 2022-07-16 MED ORDER — PROPOFOL 1000 MG/100ML IV EMUL
INTRAVENOUS | Status: AC
Start: 2022-07-16 — End: ?
  Filled 2022-07-16: qty 100

## 2022-07-16 MED ORDER — DEXAMETHASONE SODIUM PHOSPHATE 10 MG/ML IJ SOLN
INTRAMUSCULAR | Status: AC
  Filled 2022-07-16: qty 1

## 2022-07-16 MED ORDER — FERROUS SULFATE 325 (65 FE) MG PO TABS
325.0000 mg | ORAL_TABLET | Freq: Every day | ORAL | Status: DC
  Administered 2022-07-17: 325 mg via ORAL
  Filled 2022-07-16 (×2): qty 1

## 2022-07-16 MED ORDER — SUCCINYLCHOLINE CHLORIDE 200 MG/10ML IV SOSY
PREFILLED_SYRINGE | INTRAVENOUS | Status: AC
  Filled 2022-07-16: qty 10

## 2022-07-16 MED ORDER — TRANEXAMIC ACID 1000 MG/10ML IV SOLN
INTRAVENOUS | Status: DC | PRN
  Administered 2022-07-16: 2000 mg via TOPICAL

## 2022-07-16 MED ORDER — DEXAMETHASONE SODIUM PHOSPHATE 10 MG/ML IJ SOLN
10.0000 mg | Freq: Once | INTRAMUSCULAR | Status: AC
  Administered 2022-07-17: 10 mg via INTRAVENOUS
  Filled 2022-07-16: qty 1

## 2022-07-16 MED ORDER — SODIUM CHLORIDE 0.9 % IV SOLN: INTRAVENOUS | Status: DC

## 2022-07-16 MED ORDER — SODIUM CHLORIDE 0.9 % IR SOLN
Status: DC | PRN
  Administered 2022-07-16: 1000 mL

## 2022-07-16 MED ORDER — DEXAMETHASONE SODIUM PHOSPHATE 10 MG/ML IJ SOLN
INTRAMUSCULAR | Status: DC | PRN
  Administered 2022-07-16: 10 mg via INTRAVENOUS

## 2022-07-16 SURGICAL SUPPLY — 73 items
ACETAB CUP W/GRIPTION 54 (Plate) ×1 IMPLANT
ADH SKN CLS APL DERMABOND .7 (GAUZE/BANDAGES/DRESSINGS) ×1
BAG COUNTER SPONGE SURGICOUNT (BAG) ×1 IMPLANT
BAG DECANTER FOR FLEXI CONT (MISCELLANEOUS) ×1 IMPLANT
BAG SPNG CNTER NS LX DISP (BAG) ×1
CELLS DAT CNTRL 66122 CELL SVR (MISCELLANEOUS) IMPLANT
COVER PERINEAL POST (MISCELLANEOUS) ×1 IMPLANT
COVER SURGICAL LIGHT HANDLE (MISCELLANEOUS) ×1 IMPLANT
CUP ACETAB W/GRIPTION 54 (Plate) IMPLANT
DERMABOND ADVANCED (GAUZE/BANDAGES/DRESSINGS) ×1
DERMABOND ADVANCED .7 DNX12 (GAUZE/BANDAGES/DRESSINGS) IMPLANT
DRAPE C-ARM 42X72 X-RAY (DRAPES) ×1 IMPLANT
DRAPE POUCH INSTRU U-SHP 10X18 (DRAPES) ×1 IMPLANT
DRAPE STERI IOBAN 125X83 (DRAPES) ×1 IMPLANT
DRAPE U-SHAPE 47X51 STRL (DRAPES) ×2 IMPLANT
DRESSING AQUACEL AG SP 3.5X10 (GAUZE/BANDAGES/DRESSINGS) IMPLANT
DRSG AQUACEL AG ADV 3.5X10 (GAUZE/BANDAGES/DRESSINGS) ×1 IMPLANT
DRSG AQUACEL AG SP 3.5X10 (GAUZE/BANDAGES/DRESSINGS) ×1
DURAPREP 26ML APPLICATOR (WOUND CARE) ×2 IMPLANT
ELECT BLADE 4.0 EZ CLEAN MEGAD (MISCELLANEOUS) ×1
ELECT REM PT RETURN 9FT ADLT (ELECTROSURGICAL) ×1
ELECTRODE BLDE 4.0 EZ CLN MEGD (MISCELLANEOUS) ×1 IMPLANT
ELECTRODE REM PT RTRN 9FT ADLT (ELECTROSURGICAL) ×1 IMPLANT
GLOVE BIOGEL PI IND STRL 7.0 (GLOVE) ×2 IMPLANT
GLOVE BIOGEL PI IND STRL 7.5 (GLOVE) ×5 IMPLANT
GLOVE ECLIPSE 7.0 STRL STRAW (GLOVE) ×2 IMPLANT
GLOVE SKINSENSE STRL SZ7.5 (GLOVE) ×1 IMPLANT
GLOVE SURG SYN 7.5  E (GLOVE) ×2
GLOVE SURG SYN 7.5 E (GLOVE) ×2 IMPLANT
GLOVE SURG SYN 7.5 PF PI (GLOVE) ×2 IMPLANT
GLOVE SURG UNDER POLY LF SZ7 (GLOVE) ×1 IMPLANT
GLOVE SURG UNDER POLY LF SZ7.5 (GLOVE) ×2 IMPLANT
GOWN STRL REIN XL XLG (GOWN DISPOSABLE) ×1 IMPLANT
GOWN STRL REUS W/ TWL LRG LVL3 (GOWN DISPOSABLE) IMPLANT
GOWN STRL REUS W/ TWL XL LVL3 (GOWN DISPOSABLE) ×1 IMPLANT
GOWN STRL REUS W/TWL LRG LVL3 (GOWN DISPOSABLE)
GOWN STRL REUS W/TWL XL LVL3 (GOWN DISPOSABLE)
HANDPIECE INTERPULSE COAX TIP (DISPOSABLE) ×1
HEAD M SROM 36MM PLUS 1.5 (Hips) IMPLANT
HOOD PEEL AWAY FLYTE STAYCOOL (MISCELLANEOUS) ×2 IMPLANT
IV NS IRRIG 3000ML ARTHROMATIC (IV SOLUTION) ×1 IMPLANT
JET LAVAGE IRRISEPT WOUND (IRRIGATION / IRRIGATOR) ×1
KIT BASIN OR (CUSTOM PROCEDURE TRAY) ×1 IMPLANT
LAVAGE JET IRRISEPT WOUND (IRRIGATION / IRRIGATOR) ×1 IMPLANT
LINER NEUTRAL 54X36MM PLUS 4 (Hips) IMPLANT
MARKER SKIN DUAL TIP RULER LAB (MISCELLANEOUS) ×1 IMPLANT
NDL SPNL 18GX3.5 QUINCKE PK (NEEDLE) ×1 IMPLANT
NEEDLE SPNL 18GX3.5 QUINCKE PK (NEEDLE) ×1 IMPLANT
PACK TOTAL JOINT (CUSTOM PROCEDURE TRAY) ×1 IMPLANT
PACK UNIVERSAL I (CUSTOM PROCEDURE TRAY) ×1 IMPLANT
PENCIL BUTTON HOLSTER BLD 10FT (ELECTRODE) IMPLANT
RETRACTOR WND ALEXIS 18 MED (MISCELLANEOUS) IMPLANT
RTRCTR WOUND ALEXIS 18CM MED (MISCELLANEOUS)
SAW OSC TIP CART 19.5X105X1.3 (SAW) ×1 IMPLANT
SCREW 6.5MMX25MM (Screw) IMPLANT
SET HNDPC FAN SPRY TIP SCT (DISPOSABLE) ×1 IMPLANT
SROM M HEAD 36MM PLUS 1.5 (Hips) ×1 IMPLANT
STAPLER VISISTAT 35W (STAPLE) IMPLANT
STEM FEM ACTIS HIGH SZ7 (Stem) IMPLANT
SUT ETHIBOND 2 V 37 (SUTURE) ×1 IMPLANT
SUT ETHILON 2 0 FS 18 (SUTURE) IMPLANT
SUT VIC AB 0 CT1 27 (SUTURE) ×1
SUT VIC AB 0 CT1 27XBRD ANBCTR (SUTURE) ×1 IMPLANT
SUT VIC AB 1 CTX 36 (SUTURE) ×1
SUT VIC AB 1 CTX36XBRD ANBCTR (SUTURE) ×1 IMPLANT
SUT VIC AB 2-0 CT1 27 (SUTURE) ×2
SUT VIC AB 2-0 CT1 TAPERPNT 27 (SUTURE) ×2 IMPLANT
SYR 50ML LL SCALE MARK (SYRINGE) ×1 IMPLANT
TOWEL GREEN STERILE (TOWEL DISPOSABLE) ×1 IMPLANT
TRAY CATH 16FR W/PLASTIC CATH (SET/KITS/TRAYS/PACK) IMPLANT
TRAY FOLEY W/BAG SLVR 16FR (SET/KITS/TRAYS/PACK) ×1
TRAY FOLEY W/BAG SLVR 16FR ST (SET/KITS/TRAYS/PACK) ×1 IMPLANT
YANKAUER SUCT BULB TIP NO VENT (SUCTIONS) ×1 IMPLANT

## 2022-07-16 NOTE — Discharge Instructions (Signed)

## 2022-07-16 NOTE — Transfer of Care (Signed)
Immediate Anesthesia Transfer of Care Note  Patient: Peter [name]  Procedure(s) Performed: RIGHT TOTAL HIP ARTHROPLASTY ANTERIOR APPROACH (Right: Hip)  Patient Location: PACU  Anesthesia Type:MAC and Spinal  Level of Consciousness: awake, alert  and oriented  Airway & Oxygen Therapy: Patient Spontanous Breathing  Post-op Assessment: Report given to RN and Post -op Vital signs reviewed and stable  Post vital signs: Reviewed and stable  Last Vitals:  Vitals Value Taken Time  BP 102/64 07/16/22 0915  Temp 36.5 C 07/16/22 0915  Pulse 60 07/16/22 0917  Resp 16 07/16/22 0917  SpO2 96 % 07/16/22 0917  Vitals shown include unvalidated device data.  Last Pain:  Vitals:   07/16/22 0622  TempSrc: Oral  PainSc:          Complications: No notable events documented.

## 2022-07-16 NOTE — Evaluation (Signed)
Physical Therapy Evaluation Patient Details Name: Peter [name] MRN: [id number] DOB: 02-May-1946 Today's Date: 07/16/2022  History of Present Illness  Pt is a 76 y/o male s/p R THA, direct anterior. PMH includes HTN, CHF, CAD s/p stent, a fib, and CKD.  Clinical Impression  Pt admitted secondary to problem above with deficits below. Pt requiring min to min guard A for mobility using RW. Pt with some light headedness throughout. Anticipate pt will progress well. Discussed using RW instead of rollator for increased support and ease of mobility throughout home. Will continue to follow acutely.        Recommendations for follow up therapy are one component of a multi-disciplinary discharge planning process, led by the attending physician.  Recommendations may be updated based on patient status, additional functional criteria and insurance authorization.  Follow Up Recommendations Follow physician's recommendations for discharge plan and follow up therapies      Assistance Recommended at Discharge Intermittent Supervision/Assistance  Patient can return home with the following  Assistance with cooking/housework;Assist for transportation    Equipment Recommendations Rolling walker (2 wheels)  Recommendations for Other Services       Functional Status Assessment Patient has had a recent decline in their functional status and demonstrates the ability to make significant improvements in function in a reasonable and predictable amount of time.     Precautions / Restrictions Precautions Precautions: Fall Restrictions Weight Bearing Restrictions: Yes RLE Weight Bearing: Weight bearing as tolerated      Mobility  Bed Mobility Overal bed mobility: Needs Assistance Bed Mobility: Supine to Sit     Supine to sit: Min assist     General bed mobility comments: ASsist for RLE management    Transfers Overall transfer level: Needs assistance Equipment used: Rolling walker (2  wheels) Transfers: Sit to/from Stand Sit to Stand: From elevated surface, Min assist           General transfer comment: Min A for lift assist and steadying. Cues for hand placement.    Ambulation/Gait Ambulation/Gait assistance: Min guard Gait Distance (Feet): 20 Feet Assistive device: Rolling walker (2 wheels) Gait Pattern/deviations: Step-to pattern, Decreased step length - right, Decreased step length - left, Decreased weight shift to right, Antalgic Gait velocity: Decreased     General Gait Details: Slow, antalgic gait. Ambulated to bathroom and back. Min guard for safety. Cues for sequencing.  Stairs            Wheelchair Mobility    Modified Rankin (Stroke Patients Only)       Balance Overall balance assessment: Needs assistance Sitting-balance support: No upper extremity supported, Feet supported Sitting balance-Leahy Scale: Good     Standing balance support: During functional activity, Bilateral upper extremity supported Standing balance-Leahy Scale: Poor Standing balance comment: REliant on UE support                             Pertinent Vitals/Pain      Home Living Family/patient expects to be discharged to:: Private residence Living Arrangements: Spouse/significant other Available Help at Discharge: Family Type of Home: House Home Access: Stairs to enter Entrance Stairs-Rails: Right Entrance Stairs-Number of Steps: 3   Home Layout: Two level;Able to live on main level with bedroom/bathroom Home Equipment: Rollator (4 wheels);Crutches;BSC/3in1      Prior Function Prior Level of Function : Independent/Modified Independent             Mobility Comments: Using crutches intermittently  Hand Dominance        Extremity/Trunk Assessment   Upper Extremity Assessment Upper Extremity Assessment: Overall WFL for tasks assessed    Lower Extremity Assessment Lower Extremity Assessment: RLE deficits/detail RLE  Deficits / Details: Deficits consistent with post op pain and weakness.    Cervical / Trunk Assessment Cervical / Trunk Assessment: Normal  Communication   Communication: No difficulties  Cognition Arousal/Alertness: Awake/alert Behavior During Therapy: WFL for tasks assessed/performed Overall Cognitive Status: Within Functional Limits for tasks assessed                                          General Comments      Exercises Total Joint Exercises Ankle Circles/Pumps: AROM, Both, 10 reps, Seated   Assessment/Plan    PT Assessment Patient needs continued PT services  PT Problem List Decreased strength;Decreased range of motion;Decreased activity tolerance;Decreased balance;Decreased mobility;Decreased knowledge of use of DME;Decreased knowledge of precautions       PT Treatment Interventions DME instruction;Gait training;Stair training;Functional mobility training;Therapeutic activities;Therapeutic exercise;Balance training;Patient/family education    PT Goals (Current goals can be found in the Care Plan section)  Acute Rehab PT Goals Patient Stated Goal: to go home PT Goal Formulation: With patient Time For Goal Achievement: 07/30/22 Potential to Achieve Goals: Good    Frequency 7X/week     Co-evaluation               AM-PAC PT "6 Clicks" Mobility  Outcome Measure Help needed turning from your back to your side while in a flat bed without using bedrails?: A Little Help needed moving from lying on your back to sitting on the side of a flat bed without using bedrails?: A Little Help needed moving to and from a bed to a chair (including a wheelchair)?: A Little Help needed standing up from a chair using your arms (e.g., wheelchair or bedside chair)?: A Little Help needed to walk in hospital room?: A Little Help needed climbing 3-5 steps with a railing? : A Lot 6 Click Score: 17    End of Session Equipment Utilized During Treatment: Gait  belt Activity Tolerance: Patient tolerated treatment well Patient left: in chair;with call bell/phone within reach Nurse Communication: Mobility status PT Visit Diagnosis: Unsteadiness on feet (R26.81);Difficulty in walking, not elsewhere classified (R26.2);Pain Pain - Right/Left: Right Pain - part of body: Hip    Time: 0630-1601 PT Time Calculation (min) (ACUTE ONLY): 26 min   Charges:   PT Evaluation $PT Eval Low Complexity: 1 Low PT Treatments $Therapeutic Activity: 8-22 mins        Peter [name], Peter [name]  Acute Rehabilitation Services  Office: [phone]   Peter [name] 07/16/2022, 3:58 PM

## 2022-07-16 NOTE — Anesthesia Procedure Notes (Signed)
Spinal  Patient location during procedure: OR Start time: 07/16/2022 7:25 AM End time: 07/16/2022 7:30 AM Reason for block: surgical anesthesia Staffing Performed: anesthesiologist  Anesthesiologist: Santa Lighter, MD Performed by: Santa Lighter, MD Authorized by: Santa Lighter, MD   Preanesthetic Checklist Completed: patient identified, IV checked, risks and benefits discussed, surgical consent, monitors and equipment checked, pre-op evaluation and timeout performed Spinal Block Patient position: sitting Prep: DuraPrep and site prepped and draped Patient monitoring: continuous pulse ox and blood pressure Approach: midline Location: L3-4 Injection technique: single-shot Needle Needle type: Pencan  Needle gauge: 24 G Assessment Events: CSF return Additional Notes Functioning IV was confirmed and monitors were applied. Sterile prep and drape, including hand hygiene, mask and sterile gloves were used. The patient was positioned and the spine was prepped. The skin was anesthetized with lidocaine.  Free flow of clear CSF was obtained prior to injecting local anesthetic into the CSF.  The spinal needle aspirated freely following injection.  The needle was carefully withdrawn.  The patient tolerated the procedure well. Consent was obtained prior to procedure with all questions answered and concerns addressed. Risks including but not limited to bleeding, infection, nerve damage, paralysis, failed block, inadequate analgesia, allergic reaction, high spinal, itching and headache were discussed and the patient wished to proceed.   Hoy Morn, MD

## 2022-07-16 NOTE — Op Note (Signed)
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 Total hip replacement; Right hip; uncemented [id number]   Surgeon: Frankey Shown, M.D.  Assist: Madalyn Rob, PA-C   Anesthesia: spinal  Prosthesis: Depuy Acetabulum: Pinnacle 54 mm Femur: Actis 7 HO Head: 36 mm size: +1.5 Liner: +4 Bearing Type: metal/poly  Total Hip Arthroplasty (Anterior Approach) Op Note:  After informed consent was obtained and the operative extremity marked in the holding area, the patient was brought back to the operating room and placed supine on the HANA table. Next, the operative extremity was prepped and draped in normal sterile fashion. Surgical timeout occurred verifying patient identification, surgical site, surgical procedure and administration of antibiotics.  A modified anterior Smith-Peterson approach to the hip was performed, using the interval between tensor fascia lata and sartorius.  Dissection was carried bluntly down onto the anterior hip capsule. The lateral femoral circumflex vessels were identified and coagulated. A capsulotomy was performed and the capsular flaps tagged for later repair.  The neck osteotomy was performed. The femoral head was removed which showed significant wear, the acetabular rim was cleared of soft tissue and attention was turned to reaming the acetabulum.  Sequential reaming was performed under fluoroscopic guidance. We reamed to a size 54 mm, and then impacted the acetabular shell. A 25 mm cancellous screw was placed through the shell for added fixation.  The liner was then placed after irrigation and attention turned to the femur.  After placing the femoral hook, the leg was taken to  externally rotated, extended and adducted position taking care to perform soft tissue releases to allow for adequate mobilization of the femur. Soft tissue was cleared from the shoulder of the greater trochanter and the hook elevator used to improve exposure of the proximal femur. Sequential broaching performed up to a size 7. Trial neck and head were placed. The leg was brought back up to neutral and the construct reduced.  Antibiotic irrigation was placed in the surgical wound.  The position and sizing of components, offset and leg lengths were checked using fluoroscopy. Stability of the construct was checked in extension and external rotation without any subluxation or impingement of prosthesis. We dislocated the prosthesis, dropped the leg back into position, removed trial components, and irrigated copiously. The final stem and head was then placed, the leg brought back up, the system reduced and fluoroscopy used to verify positioning.  We irrigated, obtained hemostasis and closed the capsule using #2 ethibond suture.  One gram of vancomycin powder was placed in the surgical bed.   One gram of topical tranexamic acid was injected into the joint.  The fascia was closed with #1 vicryl plus, the deep fat layer was closed with 0 vicryl, the subcutaneous layers closed with 2.0 Vicryl Plus and the skin closed with 2.0 nylon and dermabond. A sterile dressing was applied. The patient was awakened in the operating room and taken to recovery in stable condition.  All sponge, needle, and instrument counts were correct at the end of the case.   Tawanna Cooler, my PA, was a medical necessity for opening, closing, limb positioning, retracting, exposing, and overall facilitation and timely completion of the surgery.  Position: supine  Complications: see description of procedure.  Time Out: performed   Drains/Packing: none  Estimated blood loss: see anesthesia record  Returned to Recovery Room: in good  condition.   Antibiotics: yes   Mechanical VTE (DVT) Prophylaxis: sequential compression devices, TED thigh-high  Chemical VTE (DVT) Prophylaxis: resume xarelto POD 1   Fluid Replacement: see anesthesia record  Specimens Removed: 1 to pathology   Sponge and Instrument Count Correct? yes   PACU: portable radiograph - low AP   Plan/RTC: Return in 2 weeks for staple removal. Weight Bearing/Load Lower Extremity: full  Hip precautions: none Suture Removal: 2 weeks   N. Eduard Roux, MD [facility] 8:45 AM   Implant Name Type Inv. Item Serial No. Manufacturer Lot No. LRB No. Used Action  LINER NEUTRAL 54X36MM PLUS 4 - [id number] Hips LINER NEUTRAL 54X36MM PLUS 4  [employer] [id number] Right 1 Implanted  ACETAB CUP W/GRIPTION 54 - [id number] Plate ACETAB CUP W/GRIPTION 54  [employer] [id number] Right 1 Implanted  SCREW 6.5MMX25MM - [id number] Screw SCREW 6.5MMX25MM  [employer] [id number] Right 1 Implanted  STEM FEM ACTIS HIGH SZ7 - [id number] Stem STEM FEM ACTIS HIGH SZ7  [employer] [id number] Right 1 Implanted  SROM M HEAD 36MM PLUS 1.5 - [id number] Hips SROM M HEAD 36MM PLUS 1.5  [employer] [id number] Right 1 Implanted

## 2022-07-16 NOTE — Anesthesia Postprocedure Evaluation (Signed)
Anesthesia Post Note  Patient: Peter [name]  Procedure(s) Performed: RIGHT TOTAL HIP ARTHROPLASTY ANTERIOR APPROACH (Right: Hip)     Patient location during evaluation: PACU Anesthesia Type: Spinal Level of consciousness: awake, awake and alert and oriented Pain management: pain level controlled Vital Signs Assessment: post-procedure vital signs reviewed and stable Respiratory status: spontaneous breathing, nonlabored ventilation and respiratory function stable Cardiovascular status: blood pressure returned to baseline and stable Postop Assessment: no headache, no backache, spinal receding and no apparent nausea or vomiting Anesthetic complications: no   No notable events documented.  Last Vitals:  Vitals:   07/16/22 1015 07/16/22 1030  BP: 111/61 (!) 124/99  Pulse: (!) 53 64  Resp: 19 18  Temp: 36.7 C   SpO2: 96% 96%    Last Pain:  Vitals:   07/16/22 1035  TempSrc:   PainSc: 0-No pain                 Santa Lighter

## 2022-07-16 NOTE — H&P (Signed)
Peter H&P  Chief Complaint: right hip degenerative joint disease  HPI: Peter [name] is a 76 y.o. male who presents for surgical treatment of right hip degenerative joint disease.  He denies any changes in medical history.  Past Medical History:  Diagnosis Date   Arthritis    At risk for sleep apnea    STOP-BANG= 5       SENT [name] PCP 07-12-2015   CAD (coronary artery disease)    a. s/p PCI with DES x 3 [name] LAD in 2006 with bifurcation lesion and Plavix indefinitely was recommended.   Chronic combined systolic and diastolic CHF (congestive heart failure) ([place])    a. Prior EF normal but EF dropped [name] 30-35% in 07/2016 in setting of AFIB.   CKD (chronic kidney disease), stage III (HCC)    Depression    Dyslipidemia    Dyspnea    tired quick d/t Afib   Dyspnea on exertion    Dysrhythmia    A.FIB   GERD (gastroesophageal reflux disease)    History of GI bleed    History of kidney stones    History of MI (myocardial infarction)    11-05-2005--  periprocedural MI  (cardiac cath) per dr [name] note   Hypertension    Hypertriglyceridemia    Iron deficiency anemia    Myocardial infarction [facility])    Persistent atrial fibrillation ([place])    a. 07/2016, started on Eliquis -> readmitted later that month for AF RVR/acute HF - initially unsuccessful DCCV following TEE, so loaded with amio with repeat successful DCCV 07/2816.   Right ureteral stone    S/P drug eluting coronary stent placement    x3  [name] LAD  2006   Sleep apnea    wears BIPAP, 14 up [name] 24   Past Surgical History:  Procedure Laterality Date   CARDIOVASCULAR STRESS TEST  01-06-2013  dr [name]   normal perfusion study/  no ischemia or infarct/  normal LV function and wall motion , ef 57%   CARDIOVERSION N/A 08/05/2016   Procedure: CARDIOVERSION;  Surgeon: Sanda Klein, MD;  Location: [place];  Service: Cardiovascular;  Laterality: N/A;   CARDIOVERSION N/A 08/08/2016   Procedure: CARDIOVERSION;  Surgeon: Jerline Pain, MD;  Location: [place];  Service: Cardiovascular;  Laterality: N/A;   CORONARY ANGIOPLASTY  11-13-2005 dr [name]   Successful touch up post-dilatation of the 3 tandem overlying Cypher stents in proximal [name] mid LAD (based on IVUS 0% stenosis and patent diagonal branch)/  Cutting Balloon Angioplasty of Ramus branch   CORONARY ANGIOPLASTY WITH STENT PLACEMENT  11-05-2005   dr [name]   PCI and DES x3 [name] birfurcation lesion LAD and diagonal branch of LAD/   20% dLM,  30-40% RCA, ef 60%   CYSTOSCOPY WITH RETROGRADE PYELOGRAM, URETEROSCOPY AND STENT PLACEMENT Right 07/14/2015   Procedure: [place], URETEROSCOPY AND STENT PLACEMENT;  Surgeon: Alexis Frock, MD;  Location: [facility];  Service: Urology;  Laterality: Right;   ELECTROPHYSIOLOGIC STUDY N/A 10/24/2016   Procedure: Atrial Fibrillation Ablation;  Surgeon: Will Meredith Leeds, MD;  Location: [place] CV [facility];  Service: Cardiovascular;  Laterality: N/A;   EXTRACORPOREAL SHOCK WAVE LITHOTRIPSY  yrs ago   HEMORRHOID SURGERY N/A 10/24/2017   Procedure: HEMORRHOIDECTOMY;  Surgeon: Ileana Roup, MD;  Location: [place];  Service: General;  Laterality: N/A;   HERNIA REPAIR Bilateral 2000   Inguinal Hernia   IR GENERIC HISTORICAL  10/23/2016  IR FLUORO GUIDE CV LINE RIGHT 10/23/2016 Greggory Keen, MD [facility]   IR GENERIC HISTORICAL  10/23/2016   IR US GUIDE VASC ACCESS RIGHT 10/23/2016 Greggory Keen, MD [facility]   LEFT URETEROSCOPIC STONE EXTRACTION  04/10/2006   STONE EXTRACTION WITH BASKET Right 07/14/2015   Procedure: STONE EXTRACTION WITH BASKET;  Surgeon: Alexis Frock, MD;  Location: [facility];  Service: Urology;  Laterality: Right;   TEE WITHOUT CARDIOVERSION N/A 08/05/2016   Procedure: TRANSESOPHAGEAL ECHOCARDIOGRAM (TEE);  Surgeon: Sanda Klein, MD;  Location: [facility] ENDOSCOPY;  Service: Cardiovascular;  Laterality: N/A;   TRANSTHORACIC  ECHOCARDIOGRAM  02/04/2012   grade I diastolic dysfunction/  ef 55-60%/  trivial MR and TR/  mild LAE   Social History   Socioeconomic History   Marital status: Married    Spouse name: Not on file   Number of children: Not on file   Years of education: Not on file   Highest education level: Not on file  Occupational History   Not on file  Tobacco Use   Smoking status: Never   Smokeless tobacco: Never  Vaping Use   Vaping Use: Never used  Substance and Sexual Activity   Alcohol use: No    Comment: 2 beers a year   Drug use: No   Sexual activity: Not on file  Other Topics Concern   Not on file  Social History Narrative   Not on file   Social Determinants of Health   Financial Resource Strain: Not on file  Food Insecurity: Not on file  Transportation Needs: Not on file  Physical Activity: Not on file  Stress: Not on file  Social Connections: Not on file   Family History  Problem Relation Age of Onset   Heart attack Father 90   Congestive Heart Failure Father 43   Cancer Other    No Known Allergies Prior [name] Admission medications   Medication Sig Start Date End Date Taking? Authorizing Provider  allopurinol (ZYLOPRIM) 100 MG tablet Take 100 mg by mouth at bedtime. 11/19/21  Yes [provider]  allopurinol (ZYLOPRIM) 300 MG tablet Take 300 mg by mouth in the morning.   Yes [provider]  atorvastatin (LIPITOR) 40 MG tablet Take 1 tablet (40 mg total) by mouth every morning. Please keep upcoming appt in January 2023 with Dr. [name] before anymore refills. Thank you 10/10/21  Yes Camnitz, Will Hassell Done, MD  carvedilol (COREG) 25 MG tablet TAKE 1 TABLET TWICE DAILY WITH MEALS 11/01/21  Yes Jerline Pain, MD  diphenhydramine-acetaminophen (TYLENOL PM) 25-500 MG TABS tablet Take 1 tablet by mouth at bedtime.   Yes [provider]  ferrous sulfate 325 (65 FE) MG tablet Take 1 tablet (325 mg total) by mouth every morning. Patient taking differently:  Take 325 mg by mouth every other day. 10/27/18  Yes Camnitz, Will Hassell Done, MD  furosemide (LASIX) 40 MG tablet TAKE 1 TABLET EVERY DAY 11/01/21  Yes Jerline Pain, MD  lisinopril (ZESTRIL) 40 MG tablet Take 1 tablet (40 mg total) by mouth daily. 04/01/22  Yes Camnitz, Ocie Doyne, MD  nitroGLYCERIN (NITROSTAT) 0.4 MG SL tablet Place 1 tablet (0.4 mg total) under the tongue every 5 (five) minutes as needed for chest pain. X 3 doses 11/20/20  Yes Camnitz, Will Hassell Done, MD  pantoprazole (PROTONIX) 40 MG tablet TAKE 1 TABLET EVERY DAY 11/01/21  Yes Jerline Pain, MD  polyvinyl alcohol (LIQUIFILM TEARS) 1.4 % ophthalmic solution Place 1 drop into  the right eye daily.   Yes [provider]  sertraline (ZOLOFT) 25 MG tablet Take 25 mg by mouth daily. 06/17/22  Yes [provider]  spironolactone (ALDACTONE) 25 MG tablet Take 25 mg by mouth daily. 05/22/22  Yes [provider]  XARELTO 20 MG TABS tablet TAKE 1 TABLET EVERY DAY Patient taking differently: Take 20 mg by mouth at bedtime. 11/01/21  Yes Camnitz, Will Hassell Done, MD  docusate sodium (COLACE) 100 MG capsule Take 1 capsule (100 mg total) by mouth daily as needed. 07/08/22 07/08/23  Aundra Dubin, PA-C  HYDROcodone-acetaminophen (NORCO) 5-325 MG tablet Take 1 tablet by mouth 2 (two) times daily as needed. Patient not taking: Reported on 07/02/2022 06/18/22   Aundra Dubin, PA-C  methocarbamol (ROBAXIN) 500 MG tablet Take 1 tablet (500 mg total) by mouth 2 (two) times daily as needed. [name] be taken after surgery 07/08/22   Aundra Dubin, PA-C  ondansetron (ZOFRAN) 4 MG tablet Take 1 tablet (4 mg total) by mouth every 8 (eight) hours as needed for nausea or vomiting. 07/08/22   Aundra Dubin, PA-C  oxyCODONE-acetaminophen (PERCOCET) 5-325 MG tablet Take 1-2 tablets by mouth every 6 (six) hours as needed. [name] be taken after surgery 07/08/22   Aundra Dubin, PA-C  potassium chloride SA (KLOR-CON M) 20 MEQ tablet TAKE 1 TABLET EVERY  DAY Patient not taking: Reported on 07/02/2022 11/01/21   Jerline Pain, MD     Positive ROS: All other systems have been reviewed and were otherwise negative with the exception of those mentioned in the HPI and as above.  Physical Exam: General: Alert, no acute distress Cardiovascular: No pedal edema Respiratory: No cyanosis, no use of accessory musculature GI: abdomen soft Skin: No lesions in the area of chief complaint Neurologic: Sensation intact distally Psychiatric: Patient is competent for consent with normal mood and affect Lymphatic: no lymphedema  MUSCULOSKELETAL: exam stable  Assessment: right hip degenerative joint disease  Plan: Plan for Procedure(s): RIGHT TOTAL HIP ARTHROPLASTY ANTERIOR APPROACH  The risks benefits and alternatives were discussed with the patient including but not limited [name] the risks of nonoperative treatment, versus surgical intervention including infection, bleeding, nerve injury,  blood clots, cardiopulmonary complications, morbidity, mortality, among others, and they were willing [name] proceed.   Eduard Roux, MD 07/16/2022 7:09 AM

## 2022-07-17 ENCOUNTER — Encounter (HOSPITAL_COMMUNITY): Payer: Self-pay | Admitting: Orthopaedic Surgery

## 2022-07-17 DIAGNOSIS — I2510 Atherosclerotic heart disease of native coronary artery without angina pectoris: Secondary | ICD-10-CM | POA: Diagnosis not present

## 2022-07-17 DIAGNOSIS — I5042 Chronic combined systolic (congestive) and diastolic (congestive) heart failure: Secondary | ICD-10-CM | POA: Diagnosis not present

## 2022-07-17 DIAGNOSIS — Z79899 Other long term (current) drug therapy: Secondary | ICD-10-CM | POA: Diagnosis not present

## 2022-07-17 DIAGNOSIS — N183 Chronic kidney disease, stage 3 unspecified: Secondary | ICD-10-CM | POA: Diagnosis not present

## 2022-07-17 DIAGNOSIS — I130 Hypertensive heart and chronic kidney disease with heart failure and stage 1 through stage 4 chronic kidney disease, or unspecified chronic kidney disease: Secondary | ICD-10-CM | POA: Diagnosis not present

## 2022-07-17 DIAGNOSIS — Z955 Presence of coronary angioplasty implant and graft: Secondary | ICD-10-CM | POA: Diagnosis not present

## 2022-07-17 DIAGNOSIS — I4819 Other persistent atrial fibrillation: Secondary | ICD-10-CM | POA: Diagnosis not present

## 2022-07-17 DIAGNOSIS — M1611 Unilateral primary osteoarthritis, right hip: Secondary | ICD-10-CM | POA: Diagnosis not present

## 2022-07-17 LAB — CBC
HCT: 39 % (ref 39.0–52.0)
Hemoglobin: 13.4 g/dL (ref 13.0–17.0)
MCH: 34 pg (ref 26.0–34.0)
MCHC: 34.4 g/dL (ref 30.0–36.0)
MCV: 99 fL (ref 80.0–100.0)
Platelets: 123 10*3/uL — ABNORMAL LOW (ref 150–400)
RBC: 3.94 MIL/uL — ABNORMAL LOW (ref 4.22–5.81)
RDW: 13.1 % (ref 11.5–15.5)
WBC: 16.7 10*3/uL — ABNORMAL HIGH (ref 4.0–10.5)
nRBC: 0 % (ref 0.0–0.2)

## 2022-07-17 NOTE — Care Management Obs Status (Signed)
[facility] NOTIFICATION   Patient Details  Name: Peter [name] MRN: [id number] Date of Birth: [birth year]   Medicare Observation Status Notification Given:  Yes    Pollie Friar, RN 07/17/2022, 10:27 AM

## 2022-07-17 NOTE — Discharge Summary (Signed)
Patient ID: IHAN PAT MRN: [id number] DOB/AGE: [birth year] 76 y.o.  Admit date: 07/16/2022 Discharge date: 07/17/2022  Admission Diagnoses:  Principal Problem:   Primary osteoarthritis of right hip Active Problems:   Status post total replacement of right hip   Degenerative joint disease (DJD) of hip   Discharge Diagnoses:  Same  Past Medical History:  Diagnosis Date   Arthritis    At risk for sleep apnea    STOP-BANG= 5       SENT TO PCP 07-12-2015   CAD (coronary artery disease)    a. s/p PCI with DES x 3 to LAD in 2006 with bifurcation lesion and Plavix indefinitely was recommended.   Chronic combined systolic and diastolic CHF (congestive heart failure) ([place])    a. Prior EF normal but EF dropped to 30-35% in 07/2016 in setting of AFIB.   CKD (chronic kidney disease), stage III (HCC)    Depression    Dyslipidemia    Dyspnea    tired quick d/t Afib   Dyspnea on exertion    Dysrhythmia    A.FIB   GERD (gastroesophageal reflux disease)    History of GI bleed    History of kidney stones    History of MI (myocardial infarction)    11-05-2005--  periprocedural MI  (cardiac cath) per dr [name] note   Hypertension    Hypertriglyceridemia    Iron deficiency anemia    Myocardial infarction [facility])    Persistent atrial fibrillation ([place])    a. 07/2016, started on Eliquis -> readmitted later that month for AF RVR/acute HF - initially unsuccessful DCCV following TEE, so loaded with amio with repeat successful DCCV 07/2816.   Right ureteral stone    S/P drug eluting coronary stent placement    x3  to LAD  2006   Sleep apnea    wears BIPAP, 14 up to 24    Surgeries: Procedure(s): RIGHT TOTAL HIP ARTHROPLASTY ANTERIOR APPROACH on 07/16/2022   Consultants:   Discharged Condition: Improved  Hospital Course: Peter [name] is an 76 y.o. male who was admitted 07/16/2022 for operative treatment ofPrimary osteoarthritis of right hip. Patient has severe unremitting pain that  affects sleep, daily activities, and work/hobbies. After pre-op clearance the patient was taken to the operating room on 07/16/2022 and underwent  Procedure(s): RIGHT TOTAL HIP ARTHROPLASTY ANTERIOR APPROACH.    Patient was given perioperative antibiotics:  Anti-infectives (From admission, onward)    Start     Dose/Rate Route Frequency Ordered Stop   07/16/22 1330  ceFAZolin (ANCEF) IVPB 2g/100 mL premix        2 g 200 mL/hr over 30 Minutes Intravenous Every 6 hours 07/16/22 0933 07/16/22 1853   07/16/22 0758  vancomycin (VANCOCIN) powder  Status:  Discontinued          As needed 07/16/22 0759 07/16/22 0911   07/16/22 0600  ceFAZolin (ANCEF) IVPB 2g/100 mL premix        2 g 200 mL/hr over 30 Minutes Intravenous On call to O.R. 07/16/22 0550 07/16/22 0805        Patient was given sequential compression devices, early ambulation, and chemoprophylaxis to prevent DVT.  Patient benefited maximally from hospital stay and there were no complications.    Recent vital signs: Patient Vitals for the past 24 hrs:  BP Temp Temp src Pulse Resp SpO2  07/17/22 0747 115/70 98.2 F (36.8 C) Oral 71 16 97 %  07/17/22 0348 114/73 98.3 F (36.8 C) Oral 69  18 94 %  07/16/22 2212 (!) 145/88 98 F (36.7 C) Oral 64 18 92 %  07/16/22 1920 (!) 156/81 98 F (36.7 C) -- 61 20 94 %  07/16/22 1652 133/83 98.5 F (36.9 C) Oral 61 17 98 %  07/16/22 1030 (!) 124/99 -- -- 64 18 96 %  07/16/22 1015 111/61 98.1 F (36.7 C) -- (!) 53 19 96 %  07/16/22 1000 117/63 -- -- (!) 55 13 94 %  07/16/22 0945 119/68 -- -- (!) 57 19 97 %  07/16/22 0930 107/66 -- -- (!) 56 13 95 %  07/16/22 0915 102/64 97.7 F (36.5 C) -- (!) 58 16 95 %     Recent laboratory studies: No results for input(s): "WBC", "HGB", "HCT", "PLT", "NA", "K", "CL", "CO2", "BUN", "CREATININE", "GLUCOSE", "INR", "CALCIUM" in the last 72 hours.  Invalid input(s): "PT", "2"   Discharge Medications:   Allergies as of 07/17/2022   No Known Allergies       Medication List     STOP taking these medications    HYDROcodone-acetaminophen 5-325 MG tablet Commonly known as: Norco       TAKE these medications    allopurinol 300 MG tablet Commonly known as: ZYLOPRIM Take 300 mg by mouth in the morning.   allopurinol 100 MG tablet Commonly known as: ZYLOPRIM Take 100 mg by mouth at bedtime.   atorvastatin 40 MG tablet Commonly known as: LIPITOR Take 1 tablet (40 mg total) by mouth every morning. Please keep upcoming appt in January 2023 with Dr. [name] before anymore refills. Thank you   carvedilol 25 MG tablet Commonly known as: COREG TAKE 1 TABLET TWICE DAILY WITH MEALS   diphenhydramine-acetaminophen 25-500 MG Tabs tablet Commonly known as: TYLENOL PM Take 1 tablet by mouth at bedtime.   docusate sodium 100 MG capsule Commonly known as: Colace Take 1 capsule (100 mg total) by mouth daily as needed.   ferrous sulfate 325 (65 FE) MG tablet Take 1 tablet (325 mg total) by mouth every morning. What changed: when to take this   furosemide 40 MG tablet Commonly known as: LASIX TAKE 1 TABLET EVERY DAY   lisinopril 40 MG tablet Commonly known as: ZESTRIL Take 1 tablet (40 mg total) by mouth daily.   methocarbamol 500 MG tablet Commonly known as: ROBAXIN Take 1 tablet (500 mg total) by mouth 2 (two) times daily as needed. To be taken after surgery   nitroGLYCERIN 0.4 MG SL tablet Commonly known as: NITROSTAT Place 1 tablet (0.4 mg total) under the tongue every 5 (five) minutes as needed for chest pain. X 3 doses   ondansetron 4 MG tablet Commonly known as: Zofran Take 1 tablet (4 mg total) by mouth every 8 (eight) hours as needed for nausea or vomiting.   oxyCODONE-acetaminophen 5-325 MG tablet Commonly known as: Percocet Take 1-2 tablets by mouth every 6 (six) hours as needed. To be taken after surgery   pantoprazole 40 MG tablet Commonly known as: PROTONIX TAKE 1 TABLET EVERY DAY   polyvinyl alcohol 1.4 %  ophthalmic solution Commonly known as: LIQUIFILM TEARS Place 1 drop into the right eye daily.   potassium chloride SA 20 MEQ tablet Commonly known as: KLOR-CON M TAKE 1 TABLET EVERY DAY   sertraline 25 MG tablet Commonly known as: ZOLOFT Take 25 mg by mouth daily.   spironolactone 25 MG tablet Commonly known as: ALDACTONE Take 25 mg by mouth daily.   Xarelto 20 MG Tabs tablet Generic drug:  rivaroxaban TAKE 1 TABLET EVERY DAY What changed:  how much to take when to take this               Durable Medical Equipment  (From admission, onward)           Start     Ordered   07/16/22 1024  DME Walker rolling  Once       Question:  Patient needs a walker to treat with the following condition  Answer:  History of hip replacement   07/16/22 1023   07/16/22 1024  DME 3 n 1  Once        07/16/22 1023   07/16/22 1024  DME Bedside commode  Once       Question:  Patient needs a bedside commode to treat with the following condition  Answer:  History of hip replacement   07/16/22 1023            Diagnostic Studies: DG Pelvis Portable  Result Date: 07/16/2022 CLINICAL DATA:  Postop total right hip arthroplasty. EXAM: PORTABLE PELVIS 1-2 VIEWS COMPARISON:  [facility] 07/11/2015; right hip radiographs 03/07/2022 FINDINGS: Interval total right hip arthroplasty. No perihardware lucency is seen to indicate hardware failure or loosening. Bilateral superior scrotal surgical clips are again seen, likely from prior vasectomy. Mild left sacroiliac subchondral sclerosis and joint space narrowing. Moderate superomedial left femoroacetabular joint space narrowing. The pubic symphysis joint space is maintained. Expected right hip operative changes including proximal lateral thigh subcutaneous air. No acute fracture or dislocation. IMPRESSION: Interval total right hip arthroplasty without evidence of hardware failure. Electronically Signed   By: Yvonne Kendall M.D.   On: 07/16/2022 10:08   DG HIP  UNILAT WITH PELVIS 1V RIGHT  Result Date: 07/16/2022 CLINICAL DATA:  Total right hip arthroplasty with anterior approach. EXAM: DG HIP (WITH OR WITHOUT PELVIS) 1V RIGHT COMPARISON:  Right hip radiographs 03/07/2022 FINDINGS: Images were performed intraoperatively without the presence of a radiologist. Interval total right hip arthroplasty. No hardware complication is seen. Total fluoroscopy images: 4 Total fluoroscopy time: 25 seconds Total dose: Radiation Exposure Index (as provided by the fluoroscopic device): 3.205 mGy air Kerma Please see intraoperative findings for further detail. IMPRESSION: Intraoperative fluoroscopy for total right hip arthroplasty. Electronically Signed   By: Yvonne Kendall M.D.   On: 07/16/2022 08:49   DG C-Arm 1-60 Min-No Report  Result Date: 07/16/2022 Fluoroscopy was utilized by the requesting physician.  No radiographic interpretation.   DG C-Arm 1-60 Min-No Report  Result Date: 07/16/2022 Fluoroscopy was utilized by the requesting physician.  No radiographic interpretation.    Disposition: Discharge disposition: 01-Home or Self Care          Follow-up Information     Leandrew Koyanagi, MD. Schedule an appointment as soon as possible for a visit in 2 week(s).   Specialty: Orthopedic Surgery Contact information: [place] [address] [phone]         [facility] [place] Follow up.   Specialty: Home Health Services Why: The home health agency will contact you for the first home visit. Contact information: [address] [place] [address] [phone]                  Signed: Aundra Dubin 07/17/2022, 8:01 AM

## 2022-07-17 NOTE — Plan of Care (Signed)
Pt doing well. Pt and wife given D/C instructions with verbal understanding. Rx's were sent to the pharmacy by MD. Pt's incision is clean and dry with no sign of infection. Pt's IV was removed prior to D/C. Pt received RW from [employer] per MD order. Pt D/C'd home via wheelchair per MD order. Pt is stable @ D/C and has no other needs at this time. Holli Humbles, RN

## 2022-07-17 NOTE — Progress Notes (Signed)
Physical Therapy Treatment Patient Details Name: Peter [name] MRN: [id number] DOB: 1946-10-12 Today's Date: 07/17/2022   History of Present Illness Pt is a 76 y/o male s/p R THA, direct anterior. PMH includes HTN, CHF, CAD s/p stent, a fib, and CKD.    PT Comments    Pt progressing towards physical therapy goals. Was able to perform transfers and ambulation with up to min assist and RW for support. Reviewed HEP and handout was provided, and pt was educated on stair negotiation, car transfer, and appropriate activity progression. Pt would like to see PT again before he discharges. Will continue to follow and progress as able per POC.     Recommendations for follow up therapy are one component of a multi-disciplinary discharge planning process, led by the attending physician.  Recommendations may be updated based on patient status, additional functional criteria and insurance authorization.  Follow Up Recommendations  Follow physician's recommendations for discharge plan and follow up therapies     Assistance Recommended at Discharge Intermittent Supervision/Assistance  Patient can return home with the following Assistance with cooking/housework;Assist for transportation   Equipment Recommendations  Rolling walker (2 wheels)    Recommendations for Other Services       Precautions / Restrictions Precautions Precautions: Fall Precaution Comments: Direct anterior approach, no precautions Restrictions Weight Bearing Restrictions: Yes RLE Weight Bearing: Weight bearing as tolerated     Mobility  Bed Mobility Overal bed mobility: Needs Assistance Bed Mobility: Sit to Supine       Sit to supine: Min assist   General bed mobility comments: Assist for RLE management back up into bed at end of session.    Transfers Overall transfer level: Needs assistance Equipment used: Rolling walker (2 wheels) Transfers: Sit to/from Stand Sit to Stand: Supervision           General  transfer comment: Pt demonstrated proper hand placement on seated surface for safety. No assist required.    Ambulation/Gait Ambulation/Gait assistance: Min guard, Supervision Gait Distance (Feet): 300 Feet Assistive device: Rolling walker (2 wheels) Gait Pattern/deviations: Decreased step length - right, Decreased weight shift to right, Antalgic, Step-through pattern, Decreased dorsiflexion - right Gait velocity: Decreased Gait velocity interpretation: <1.8 ft/sec, indicate of risk for recurrent falls   General Gait Details: Slow but generally steady with RW for support. No assist required. Progressed to supervision level for safety.   Stairs Stairs: Yes Stairs assistance: Min guard Stair Management: One rail Right, One rail Left, Step to pattern, Sideways Number of Stairs: 10 General stair comments: VC's for general sequencing and safety. Pt turned 1/4 turn towards the rail and held with BUE's for support. Safe with stair management.   Wheelchair Mobility    Modified Rankin (Stroke Patients Only)       Balance Overall balance assessment: Needs assistance Sitting-balance support: No upper extremity supported, Feet supported Sitting balance-Leahy Scale: Good     Standing balance support: During functional activity, Bilateral upper extremity supported Standing balance-Leahy Scale: Fair Standing balance comment: Pt could static stand without UE support.                            Cognition Arousal/Alertness: Awake/alert Behavior During Therapy: WFL for tasks assessed/performed Overall Cognitive Status: Within Functional Limits for tasks assessed  Exercises Total Joint Exercises Ankle Circles/Pumps: 20 reps Quad Sets: 15 reps Short Arc Quad: 15 reps Heel Slides: 10 reps Hip ABduction/ADduction: 10 reps Long Arc Quad: 15 reps    General Comments        Pertinent Vitals/Pain Pain  Assessment Pain Assessment: Faces Faces Pain Scale: Hurts a little bit Pain Location: R hip Pain Descriptors / Indicators: Operative site guarding, Pressure Pain Intervention(s): Limited activity within patient's tolerance, Monitored during session, Repositioned    Home Living                          Prior Function            PT Goals (current goals can now be found in the care plan section) Acute Rehab PT Goals Patient Stated Goal: to go home PT Goal Formulation: With patient Time For Goal Achievement: 07/30/22 Potential to Achieve Goals: Good Progress towards PT goals: Progressing toward goals    Frequency    7X/week      PT Plan Current plan remains appropriate    Co-evaluation              AM-PAC PT "6 Clicks" Mobility   Outcome Measure  Help needed turning from your back to your side while in a flat bed without using bedrails?: None Help needed moving from lying on your back to sitting on the side of a flat bed without using bedrails?: A Little Help needed moving to and from a bed to a chair (including a wheelchair)?: A Little Help needed standing up from a chair using your arms (e.g., wheelchair or bedside chair)?: A Little Help needed to walk in hospital room?: A Little Help needed climbing 3-5 steps with a railing? : A Little 6 Click Score: 19    End of Session Equipment Utilized During Treatment: Gait belt Activity Tolerance: Patient tolerated treatment well Patient left: in chair;with call bell/phone within reach Nurse Communication: Mobility status PT Visit Diagnosis: Unsteadiness on feet (R26.81);Difficulty in walking, not elsewhere classified (R26.2);Pain Pain - Right/Left: Right Pain - part of body: Hip     Time: 1950-9326 PT Time Calculation (min) (ACUTE ONLY): 33 min  Charges:  $Gait Training: 23-37 mins                     Rolinda Roan, PT, DPT Acute Rehabilitation Services Secure Chat Preferred Office: [phone]     Thelma Comp 07/17/2022, 10:10 AM

## 2022-07-17 NOTE — Progress Notes (Signed)
Subjective: 1 Day Post-Op Procedure(s) (LRB): RIGHT TOTAL HIP ARTHROPLASTY ANTERIOR APPROACH (Right) Patient reports pain as mild.    Objective: Vital signs in last 24 hours: Temp:  [97.7 F (36.5 C)-98.5 F (36.9 C)] 98.2 F (36.8 C) (09/06 0747) Pulse Rate:  [53-71] 71 (09/06 0747) Resp:  [13-20] 16 (09/06 0747) BP: (102-156)/(61-99) 115/70 (09/06 0747) SpO2:  [92 %-98 %] 97 % (09/06 0747)  Intake/Output from previous day: 09/05 0701 - 09/06 0700 In: 1200 [I.V.:1000; IV Piggyback:200] Out: 675 [Urine:375; Blood:300] Intake/Output this shift: No intake/output data recorded.  No results for input(s): "HGB" in the last 72 hours. No results for input(s): "WBC", "RBC", "HCT", "PLT" in the last 72 hours. No results for input(s): "NA", "K", "CL", "CO2", "BUN", "CREATININE", "GLUCOSE", "CALCIUM" in the last 72 hours. No results for input(s): "LABPT", "INR" in the last 72 hours.  Neurologically intact Neurovascular intact Sensation intact distally Intact pulses distally Dorsiflexion/Plantar flexion intact Incision: dressing C/D/I No cellulitis present Compartment soft   Assessment/Plan: 1 Day Post-Op Procedure(s) (LRB): RIGHT TOTAL HIP ARTHROPLASTY ANTERIOR APPROACH (Right) Advance diet Up with therapy D/C IV fluids WBAT RLE D/c after first or second PT session (once cleared by PT)      Aundra Dubin 07/17/2022, 8:00 AM

## 2022-07-17 NOTE — Progress Notes (Signed)
Physical Therapy Treatment Patient Details Name: Peter [name] MRN: [id number] DOB: 1946-09-16 Today's Date: 07/17/2022   History of Present Illness Pt is a 76 y/o male s/p R THA, direct anterior. PMH includes HTN, CHF, CAD s/p stent, a fib, and CKD.    PT Comments    Pt progressing towards physical therapy goals. Reinforced safety precautions, car transfer, frequency of HEP and progression of ambulation. Pt's wife present at end of session for education as well. Will continue to follow and progress as able per POC however pt anticipates d/c home as soon as possible.   Recommendations for follow up therapy are one component of a multi-disciplinary discharge planning process, led by the attending physician.  Recommendations may be updated based on patient status, additional functional criteria and insurance authorization.  Follow Up Recommendations  Follow physician's recommendations for discharge plan and follow up therapies     Assistance Recommended at Discharge Intermittent Supervision/Assistance  Patient can return home with the following Assistance with cooking/housework;Assist for transportation   Equipment Recommendations  Rolling walker (2 wheels)    Recommendations for Other Services       Precautions / Restrictions Precautions Precautions: Fall Precaution Comments: Direct anterior approach, no precautions Restrictions Weight Bearing Restrictions: Yes RLE Weight Bearing: Weight bearing as tolerated     Mobility  Bed Mobility Overal bed mobility: Needs Assistance Bed Mobility: Supine to Sit     Supine to sit: Min guard Sit to supine: Min assist   General bed mobility comments: Increased time and use of rails required. No assist required from therapist for RLE management.    Transfers Overall transfer level: Needs assistance Equipment used: Rolling walker (2 wheels) Transfers: Sit to/from Stand Sit to Stand: Supervision           General transfer  comment: Pt demonstrated proper hand placement on seated surface for safety. No assist required.    Ambulation/Gait Ambulation/Gait assistance: Supervision Gait Distance (Feet): 300 Feet Assistive device: Rolling walker (2 wheels) Gait Pattern/deviations: Decreased step length - right, Decreased weight shift to right, Antalgic, Step-through pattern, Decreased dorsiflexion - right Gait velocity: Decreased Gait velocity interpretation: 1.31 - 2.62 ft/sec, indicative of limited community ambulator   General Gait Details: Slow but generally steady with RW for support. No assist required.   Stairs Stairs: Yes Stairs assistance: Min guard Stair Management: One rail Right, One rail Left, Step to pattern, Sideways Number of Stairs: 10 General stair comments: VC's for general sequencing and safety. Pt turned 1/4 turn towards the rail and held with BUE's for support. Safe with stair management.   Wheelchair Mobility    Modified Rankin (Stroke Patients Only)       Balance Overall balance assessment: Needs assistance Sitting-balance support: No upper extremity supported, Feet supported Sitting balance-Leahy Scale: Good     Standing balance support: During functional activity, Bilateral upper extremity supported Standing balance-Leahy Scale: Fair Standing balance comment: Pt could static stand without UE support.                            Cognition Arousal/Alertness: Awake/alert Behavior During Therapy: WFL for tasks assessed/performed Overall Cognitive Status: Within Functional Limits for tasks assessed                                          Exercises Total Joint Exercises Ankle Circles/Pumps:  20 reps Quad Sets: 15 reps Short Arc Quad: 15 reps Heel Slides: 10 reps Hip ABduction/ADduction: 10 reps Long Arc Quad: 15 reps    General Comments        Pertinent Vitals/Pain Pain Assessment Pain Assessment: Faces Faces Pain Scale: Hurts a  little bit Pain Location: R hip Pain Descriptors / Indicators: Operative site guarding, Pressure Pain Intervention(s): Limited activity within patient's tolerance, Monitored during session, Repositioned    Home Living                          Prior Function            PT Goals (current goals can now be found in the care plan section) Acute Rehab PT Goals Patient Stated Goal: to go home PT Goal Formulation: With patient Time For Goal Achievement: 07/30/22 Potential to Achieve Goals: Good Progress towards PT goals: Progressing toward goals    Frequency    7X/week      PT Plan Current plan remains appropriate    Co-evaluation              AM-PAC PT "6 Clicks" Mobility   Outcome Measure  Help needed turning from your back to your side while in a flat bed without using bedrails?: None Help needed moving from lying on your back to sitting on the side of a flat bed without using bedrails?: A Little Help needed moving to and from a bed to a chair (including a wheelchair)?: A Little Help needed standing up from a chair using your arms (e.g., wheelchair or bedside chair)?: A Little Help needed to walk in hospital room?: A Little Help needed climbing 3-5 steps with a railing? : A Little 6 Click Score: 19    End of Session Equipment Utilized During Treatment: Gait belt Activity Tolerance: Patient tolerated treatment well Patient left: in chair;with call bell/phone within reach Nurse Communication: Mobility status PT Visit Diagnosis: Unsteadiness on feet (R26.81);Difficulty in walking, not elsewhere classified (R26.2);Pain Pain - Right/Left: Right Pain - part of body: Hip     Time: 1107-1130 PT Time Calculation (min) (ACUTE ONLY): 23 min  Charges:  $Gait Training: 23-37 mins                     Rolinda Roan, PT, DPT Acute Rehabilitation Services Secure Chat Preferred Office: [phone]    Thelma Comp 07/17/2022, 12:13 PM

## 2022-07-19 ENCOUNTER — Telehealth: Payer: Self-pay | Admitting: *Deleted

## 2022-07-19 NOTE — Telephone Encounter (Signed)
Ortho bundle D/C call completed. 

## 2022-07-25 ENCOUNTER — Telehealth: Payer: Self-pay | Admitting: *Deleted

## 2022-07-25 NOTE — Telephone Encounter (Signed)
Ortho bundle 7 day call completed. 

## 2022-07-30 ENCOUNTER — Emergency Department (HOSPITAL_COMMUNITY)
Admission: EM | Admit: 2022-07-30 | Discharge: 2022-07-30 | Disposition: A | Discharge status: Home / Self Care | Payer: Medicare HMO | Attending: Emergency Medicine | Admitting: Emergency Medicine

## 2022-07-30 ENCOUNTER — Encounter: Payer: Medicare HMO | Admitting: Orthopaedic Surgery

## 2022-07-30 ENCOUNTER — Telehealth: Payer: Self-pay | Admitting: *Deleted

## 2022-07-30 ENCOUNTER — Encounter (HOSPITAL_COMMUNITY): Payer: Self-pay

## 2022-07-30 ENCOUNTER — Emergency Department (HOSPITAL_COMMUNITY): Payer: Medicare HMO

## 2022-07-30 DIAGNOSIS — K5641 Fecal impaction: Secondary | ICD-10-CM | POA: Diagnosis not present

## 2022-07-30 DIAGNOSIS — R1030 Lower abdominal pain, unspecified: Secondary | ICD-10-CM | POA: Diagnosis not present

## 2022-07-30 DIAGNOSIS — D72829 Elevated white blood cell count, unspecified: Secondary | ICD-10-CM | POA: Insufficient documentation

## 2022-07-30 DIAGNOSIS — Z96641 Presence of right artificial hip joint: Secondary | ICD-10-CM | POA: Insufficient documentation

## 2022-07-30 DIAGNOSIS — Z7901 Long term (current) use of anticoagulants: Secondary | ICD-10-CM | POA: Diagnosis not present

## 2022-07-30 DIAGNOSIS — K5730 Diverticulosis of large intestine without perforation or abscess without bleeding: Secondary | ICD-10-CM | POA: Diagnosis not present

## 2022-07-30 DIAGNOSIS — I10 Essential (primary) hypertension: Secondary | ICD-10-CM | POA: Diagnosis not present

## 2022-07-30 DIAGNOSIS — K5900 Constipation, unspecified: Secondary | ICD-10-CM | POA: Diagnosis not present

## 2022-07-30 DIAGNOSIS — Z79899 Other long term (current) drug therapy: Secondary | ICD-10-CM | POA: Insufficient documentation

## 2022-07-30 DIAGNOSIS — K8020 Calculus of gallbladder without cholecystitis without obstruction: Secondary | ICD-10-CM | POA: Diagnosis not present

## 2022-07-30 LAB — URINALYSIS, ROUTINE W REFLEX MICROSCOPIC
Bilirubin Urine: NEGATIVE
Glucose, UA: NEGATIVE mg/dL
Ketones, ur: NEGATIVE mg/dL
Leukocytes,Ua: NEGATIVE
Nitrite: NEGATIVE
Protein, ur: NEGATIVE mg/dL
Specific Gravity, Urine: >1.046 — ABNORMAL HIGH (ref 1.005–1.030)
pH: 5 (ref 5.0–8.0)

## 2022-07-30 LAB — CBC WITH DIFFERENTIAL/PLATELET
Abs Immature Granulocytes: 0.11 10*3/uL — ABNORMAL HIGH (ref 0.00–0.07)
Basophils Absolute: 0 10*3/uL (ref 0.0–0.1)
Basophils Relative: 0 %
Eosinophils Absolute: 0 10*3/uL (ref 0.0–0.5)
Eosinophils Relative: 0 %
HCT: 36.8 % — ABNORMAL LOW (ref 39.0–52.0)
Hemoglobin: 12.1 g/dL — ABNORMAL LOW (ref 13.0–17.0)
Immature Granulocytes: 1 %
Lymphocytes Relative: 3 %
Lymphs Abs: 0.6 10*3/uL — ABNORMAL LOW (ref 0.7–4.0)
MCH: 33.7 pg (ref 26.0–34.0)
MCHC: 32.9 g/dL (ref 30.0–36.0)
MCV: 102.5 fL — ABNORMAL HIGH (ref 80.0–100.0)
Monocytes Absolute: 0.9 10*3/uL (ref 0.1–1.0)
Monocytes Relative: 5 %
Neutro Abs: 17 10*3/uL — ABNORMAL HIGH (ref 1.7–7.7)
Neutrophils Relative %: 91 %
Platelets: 171 10*3/uL (ref 150–400)
RBC: 3.59 MIL/uL — ABNORMAL LOW (ref 4.22–5.81)
RDW: 13.7 % (ref 11.5–15.5)
WBC: 18.7 10*3/uL — ABNORMAL HIGH (ref 4.0–10.5)
nRBC: 0 % (ref 0.0–0.2)

## 2022-07-30 LAB — COMPREHENSIVE METABOLIC PANEL
ALT: 14 U/L (ref 0–44)
AST: 17 U/L (ref 15–41)
Albumin: 3.5 g/dL (ref 3.5–5.0)
Alkaline Phosphatase: 103 U/L (ref 38–126)
Anion gap: 8 (ref 5–15)
BUN: 26 mg/dL — ABNORMAL HIGH (ref 8–23)
CO2: 24 mmol/L (ref 22–32)
Calcium: 9.5 mg/dL (ref 8.9–10.3)
Chloride: 107 mmol/L (ref 98–111)
Creatinine, Ser: 1.18 mg/dL (ref 0.61–1.24)
GFR, Estimated: >60 mL/min (ref >60)
Glucose, Bld: 143 mg/dL — ABNORMAL HIGH (ref 70–99)
Potassium: 4.9 mmol/L (ref 3.5–5.1)
Sodium: 139 mmol/L (ref 135–145)
Total Bilirubin: 0.9 mg/dL (ref 0.3–1.2)
Total Protein: 6.4 g/dL — ABNORMAL LOW (ref 6.5–8.1)

## 2022-07-30 LAB — LIPASE, BLOOD: Lipase: 41 U/L (ref 11–51)

## 2022-07-30 MED ORDER — SODIUM CHLORIDE 0.9 % IV BOLUS
500.0000 mL | Freq: Once | INTRAVENOUS | Status: AC
Start: 2022-07-30 — End: 2022-07-30
  Administered 2022-07-30: 500 mL via INTRAVENOUS

## 2022-07-30 MED ORDER — FENTANYL CITRATE PF 50 MCG/ML IJ SOSY
50.0000 ug | PREFILLED_SYRINGE | Freq: Once | INTRAMUSCULAR | Status: AC
  Administered 2022-07-30: 50 ug via INTRAVENOUS
  Filled 2022-07-30: qty 1

## 2022-07-30 MED ORDER — IOHEXOL 300 MG/ML  SOLN
100.0000 mL | Freq: Once | INTRAMUSCULAR | Status: AC | PRN
Start: 2022-07-30 — End: 2022-07-30
  Administered 2022-07-30: 100 mL via INTRAVENOUS

## 2022-07-30 NOTE — Telephone Encounter (Signed)
Call from patient stating he is currently in [facility] Emergency Dept due to fecal impaction. NOted that CM just spoke with patient last week and he was doing well. States he was having bowel movements (a couple since being home). Abd began to hurt and thought he was having an episode of diverticulitis- called ambulance and noted he was impacted. Has not regularly been taking pain medication. Is being discharged today and won't be able to attend today's appointment. Rescheduled for Thursday, 08/01/22 at 9:15 am. Dr. Erlinda Hong made aware.

## 2022-07-30 NOTE — ED Provider Notes (Signed)
  Physical Exam  BP 107/74 (BP Location: Right Arm)   Pulse 82   Temp 98.9 F (37.2 C)   Resp 20   Ht '5\' 8"'$  (1.727 m)   Wt 94.8 kg   SpO2 91%   BMI 31.78 kg/m     Procedures  Procedures  ED Course / MDM   Clinical Course as of 07/30/22 0901  Tue Jul 30, 2022  0346 CBC with WBC of 18.7, more elevated than previous.  [CS]  3888 CMP and lipase are unremarkable.  [CS]  [id number] I personally viewed the images from radiology studies and agree with radiologist interpretation: CT neg for diverticulitis, there is a moderate sized stool ball in the distal colon.   [CS]  7579 Chaperone present. Digital rectal exam with moderate stool impaction, broken up and partially disimpacted, stool is dark brown.  [CS]  7282 Patient has had a large BM.  [CS]  0601 Care of the patient signed out to Dr. Armandina Gemma pending UA.  [CS]    Clinical Course User Index [CS] Truddie Hidden, MD   Medical Decision Making Amount and/or Complexity of Data Reviewed Labs: ordered. Radiology: ordered.  Risk Prescription drug management.    76 year old male presenting to the emergency department abdominal pain and constipation.  Had a leukocytosis to 18.7 but also had a fecal impaction.  CT abdomen pelvis was generally unremarkable, negative for diverticulitis, moderate-sized stool ball noted in the distal colon which is subsequently has been removed by the prior ED provider.  Urinalysis was performed unconvincing for urinary tract infection.  Patient feeling symptomatically improved on repeat assessment, abdomen soft, nontender, nondistended.  Overall stable for discharge.      Regan Lemming, MD 07/30/22 [phone]

## 2022-07-30 NOTE — ED Triage Notes (Signed)
Pt reports abd pain and constipation Had small bm last Wednesday and today no relief of lower abd pain with them.   Hip surgery 2 weeks ago  has been on pain medication and stool softener. Ambulated with walker for EMS

## 2022-07-30 NOTE — ED Provider Notes (Signed)
[facility] DEPT  Provider Note  CSN: [id number] Arrival date & time: 07/30/22 0146  History Chief Complaint  Patient presents with   Abdominal Pain    Constipation     Peter [name] is a 76 y.o. male with remote history of diverticulitis also recently had R hip replacement about 2 weeks ago. He has been taking pain medications sparingly (half tab every 6 hours) but has not had regular bowel movement in almost a week. He began having cramping, intermittent lower abdominal pains earlier in the day which was similar to prior diverticulitis. He has not had a fever, vomiting or dysuria. He has been taking oral stool softener, ?dulcolax, without results.    Home Medications Prior to Admission medications   Medication Sig Start Date End Date Taking? Authorizing Provider  allopurinol (ZYLOPRIM) 100 MG tablet Take 100 mg by mouth at bedtime. 11/19/21   [provider]  allopurinol (ZYLOPRIM) 300 MG tablet Take 300 mg by mouth in the morning.    [provider]  atorvastatin (LIPITOR) 40 MG tablet Take 1 tablet (40 mg total) by mouth every morning. Please keep upcoming appt in January 2023 with Dr. [name] before anymore refills. Thank you 10/10/21   Constance Haw, MD  carvedilol (COREG) 25 MG tablet TAKE 1 TABLET TWICE DAILY WITH MEALS 11/01/21   Jerline Pain, MD  diphenhydramine-acetaminophen (TYLENOL PM) 25-500 MG TABS tablet Take 1 tablet by mouth at bedtime.    [provider]  docusate sodium (COLACE) 100 MG capsule Take 1 capsule (100 mg total) by mouth daily as needed. 07/08/22 07/08/23  Aundra Dubin, PA-C  ferrous sulfate 325 (65 FE) MG tablet Take 1 tablet (325 mg total) by mouth every morning. Patient taking differently: Take 325 mg by mouth every other day. 10/27/18   Camnitz, Will Hassell Done, MD  furosemide (LASIX) 40 MG tablet TAKE 1 TABLET EVERY DAY 11/01/21   Jerline Pain, MD  lisinopril (ZESTRIL) 40 MG tablet Take  1 tablet (40 mg total) by mouth daily. 04/01/22   Camnitz, Ocie Doyne, MD  methocarbamol (ROBAXIN) 500 MG tablet Take 1 tablet (500 mg total) by mouth 2 (two) times daily as needed. To be taken after surgery 07/08/22   Aundra Dubin, PA-C  nitroGLYCERIN (NITROSTAT) 0.4 MG SL tablet Place 1 tablet (0.4 mg total) under the tongue every 5 (five) minutes as needed for chest pain. X 3 doses 11/20/20   Camnitz, Ocie Doyne, MD  ondansetron (ZOFRAN) 4 MG tablet Take 1 tablet (4 mg total) by mouth every 8 (eight) hours as needed for nausea or vomiting. 07/08/22   Aundra Dubin, PA-C  oxyCODONE-acetaminophen (PERCOCET) 5-325 MG tablet Take 1-2 tablets by mouth every 6 (six) hours as needed. To be taken after surgery 07/08/22   Aundra Dubin, PA-C  pantoprazole (PROTONIX) 40 MG tablet TAKE 1 TABLET EVERY DAY 11/01/21   Jerline Pain, MD  polyvinyl alcohol (LIQUIFILM TEARS) 1.4 % ophthalmic solution Place 1 drop into the right eye daily.    [provider]  potassium chloride SA (KLOR-CON M) 20 MEQ tablet TAKE 1 TABLET EVERY DAY Patient not taking: Reported on 07/02/2022 11/01/21   Jerline Pain, MD  sertraline (ZOLOFT) 25 MG tablet Take 25 mg by mouth daily. 06/17/22   [provider]  spironolactone (ALDACTONE) 25 MG tablet Take 25 mg by mouth daily. 05/22/22   [provider]  XARELTO 20 MG TABS tablet TAKE 1  TABLET EVERY DAY Patient taking differently: Take 20 mg by mouth at bedtime. 11/01/21   Camnitz, Ocie Doyne, MD     Allergies    Patient has no known allergies.   Review of Systems   Review of Systems Please see HPI for pertinent positives and negatives  Physical Exam BP 102/78   Pulse 75   Temp 98.9 F (37.2 C)   Resp (!) 25   Ht '5\' 8"'$  (1.727 m)   Wt 94.8 kg   SpO2 97%   BMI 31.78 kg/m   Physical Exam Vitals and nursing note reviewed.  Constitutional:      Appearance: Normal appearance.  HENT:     Head: Normocephalic and atraumatic.     Nose:  Nose normal.     Mouth/Throat:     Mouth: Mucous membranes are moist.  Eyes:     Extraocular Movements: Extraocular movements intact.     Conjunctiva/sclera: Conjunctivae normal.  Cardiovascular:     Rate and Rhythm: Normal rate.  Pulmonary:     Effort: Pulmonary effort is normal.     Breath sounds: Normal breath sounds.  Abdominal:     General: Abdomen is flat. Bowel sounds are normal.     Palpations: Abdomen is soft.     Tenderness: There is abdominal tenderness in the right lower quadrant and left lower quadrant. There is no guarding. Negative signs include Murphy's sign and McBurney's sign.  Musculoskeletal:        General: No swelling. Normal range of motion.     Cervical back: Neck supple.  Skin:    General: Skin is warm and dry.  Neurological:     General: No focal deficit present.     Mental Status: He is alert.  Psychiatric:        Mood and Affect: Mood normal.     ED Results / Procedures / Treatments   EKG EKG Interpretation  Date/Time:  Tuesday July 30 2022 01:58:27 EDT Ventricular Rate:  79 PR Interval:  164 QRS Duration: 106 QT Interval:  374 QTC Calculation: 429 R Axis:   8 Text Interpretation: Sinus rhythm Nonspecific T abnormalities, lateral leads Since last tracing Rate faster Confirmed by Calvert Cantor [phone]) on 07/30/2022 3:02:33 AM  Procedures Procedures  Medications Ordered in the ED Medications  fentaNYL (SUBLIMAZE) injection 50 mcg (50 mcg Intravenous Given 07/30/22 0346)  iohexol (OMNIPAQUE) 300 MG/ML solution 100 mL (100 mLs Intravenous Contrast Given 07/30/22 0449)  sodium chloride 0.9 % bolus 500 mL (500 mLs Intravenous New Bag/Given 07/30/22 0510)    Initial Impression and Plan  Patient here with abdominal pain and decreased stooling since hip replacement surgery two weeks ago. Consider diverticulitis vs constipation. Labs and CT ordered. Pain meds for comfort.   ED Course   Clinical Course as of 07/30/22 0720  Tue Jul 30, 2022   0346 CBC with WBC of 18.7, more elevated than previous.  [CS]  1914 CMP and lipase are unremarkable.  [CS]  [id number] I personally viewed the images from radiology studies and agree with radiologist interpretation: CT neg for diverticulitis, there is a moderate sized stool ball in the distal colon.   [CS]  7829 Chaperone present. Digital rectal exam with moderate stool impaction, broken up and partially disimpacted, stool is dark brown.  [CS]  5621 Patient has had a large BM.  [CS]  Plain View of the patient signed out to Dr. Armandina Gemma pending UA.  [CS]    Clinical Course User Index [CS] Karle Starch,  Mercie Eon, MD     MDM Rules/Calculators/A&P Medical Decision Making Problems Addressed: Fecal impaction [facility]): acute illness or injury  Amount and/or Complexity of Data Reviewed Labs: ordered. Decision-making details documented in ED Course. Radiology: ordered and independent interpretation performed. Decision-making details documented in ED Course.  Risk Prescription drug management.    Final Clinical Impression(s) / ED Diagnoses Final diagnoses:  Fecal impaction [facility])    Rx / DC Orders ED Discharge Orders     None        Truddie Hidden, MD 07/30/22 [phone]

## 2022-07-30 NOTE — ED Notes (Signed)
Pt was able to give a urine sample was able to have a BM. He had no current complaints of pain.

## 2022-08-01 ENCOUNTER — Encounter: Payer: Medicare HMO | Admitting: Orthopaedic Surgery

## 2022-08-02 ENCOUNTER — Ambulatory Visit (INDEPENDENT_AMBULATORY_CARE_PROVIDER_SITE_OTHER): Payer: Medicare HMO | Admitting: Orthopaedic Surgery

## 2022-08-02 ENCOUNTER — Telehealth: Payer: Self-pay | Admitting: *Deleted

## 2022-08-02 DIAGNOSIS — Z96641 Presence of right artificial hip joint: Secondary | ICD-10-CM

## 2022-08-02 NOTE — Progress Notes (Signed)
Post-Op Visit Note   Patient: Peter [name]           Date of Birth: [birth year]           MRN: [id number] Visit Date: 08/02/2022 PCP: [name], MD   Assessment & Plan:  Chief Complaint:  Chief Complaint  Patient presents with   Right Hip - Routine Post Op    Right THA 07/16/22   Visit Diagnoses:  1. History of total hip replacement, right     Plan: Peter [name] is 2 weeks status post right total hip replacement on 07/16/2022.  He experienced constipation and had to be disimpacted in the ER.  He feels much better now.  He has not needed oxycodone for a week.  He feels some soreness but overall he has no real complaints.  Was able to take a shower alone yesterday without any problems.  Examination of right hip shows a healed surgical incision.  Expected postoperative swelling.  No signs of infection or drainage.  Sutures were removed and Steri-Strips were applied.  Implant card handicap placard provided.  Refrain from elective dental work until released.  Continue home exercises.  Recheck in 4 weeks with standing AP pelvis x-rays.  Follow-Up Instructions: Return in about 4 weeks (around 08/30/2022).   Orders:  No orders of the defined types were placed in this encounter.  No orders of the defined types were placed in this encounter.   Imaging: No results found.  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 thumb 04/16/2022   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   Chronic right shoulder pain 03/07/2022   Pain of right hand 03/07/2022   Diverticulitis 02/28/2018   RLQ abdominal pain    Sigmoid diverticulitis    Acute blood loss anemia 11/13/2017   Anticoagulation adequate 11/12/2016   Epistaxis, recurrent 11/12/2016   AF (atrial fibrillation) ([place]) 10/24/2016   Obesity 08/09/2016    Atrial fibrillation with RVR ([place]) 08/03/2016   New onset a-fib ([place]) 07/16/2016   Persistent atrial fibrillation ([place]) 43/32/9518   Acute systolic (congestive) heart failure (HCC)    Ejection fraction    Iron deficiency anemia due to chronic blood loss 02/04/2012   Anemia 02/03/2012   CKD (chronic kidney disease), stage III ([place]) 02/03/2012   DOE (dyspnea on exertion) 01/28/2012   Hypertension    Coronary artery disease due to lipid rich plaque    Dyslipidemia    GERD (gastroesophageal reflux disease)    Past Medical History:  Diagnosis Date   Arthritis    At risk for sleep apnea    STOP-BANG= 5       SENT TO PCP 07-12-2015   CAD (coronary artery disease)    a. s/p PCI with DES x 3 to LAD in 2006 with bifurcation lesion and Plavix indefinitely was recommended.   Chronic combined systolic and diastolic CHF (congestive heart failure) ([place])    a. Prior EF normal but EF dropped to 30-35% in 07/2016 in setting of AFIB.   CKD (chronic kidney disease), stage III (HCC)    Depression    Dyslipidemia    Dyspnea    tired quick d/t Afib   Dyspnea on exertion    Dysrhythmia    A.FIB   GERD (gastroesophageal reflux disease)    History of GI bleed  History of kidney stones    History of MI (myocardial infarction)    11-05-2005--  periprocedural MI  (cardiac cath) per dr [name] note   Hypertension    Hypertriglyceridemia    Iron deficiency anemia    Myocardial infarction [facility])    Persistent atrial fibrillation ([place])    a. 07/2016, started on Eliquis -> readmitted later that month for AF RVR/acute HF - initially unsuccessful DCCV following TEE, so loaded with amio with repeat successful DCCV 07/2816.   Right ureteral stone    S/P drug eluting coronary stent placement    x3  to LAD  2006   Sleep apnea    wears BIPAP, 14 up to 24    Family History  Problem Relation Age of Onset   Heart attack Father 48   Congestive Heart Failure Father 58   Cancer Other     Past Surgical  History:  Procedure Laterality Date   CARDIOVASCULAR STRESS TEST  01-06-2013  dr [name]   normal perfusion study/  no ischemia or infarct/  normal LV function and wall motion , ef 57%   CARDIOVERSION N/A 08/05/2016   Procedure: CARDIOVERSION;  Surgeon: Sanda Klein, MD;  Location: [place];  Service: Cardiovascular;  Laterality: N/A;   CARDIOVERSION N/A 08/08/2016   Procedure: CARDIOVERSION;  Surgeon: Jerline Pain, MD;  Location: [place];  Service: Cardiovascular;  Laterality: N/A;   CORONARY ANGIOPLASTY  11-13-2005 dr [name]   Successful touch up post-dilatation of the 3 tandem overlying Cypher stents in proximal to mid LAD (based on IVUS 0% stenosis and patent diagonal branch)/  Cutting Balloon Angioplasty of Ramus branch   CORONARY ANGIOPLASTY WITH STENT PLACEMENT  11-05-2005   dr [name]   PCI and DES x3 to birfurcation lesion LAD and diagonal branch of LAD/   20% dLM,  30-40% RCA, ef 60%   CYSTOSCOPY WITH RETROGRADE PYELOGRAM, URETEROSCOPY AND STENT PLACEMENT Right 07/14/2015   Procedure: [place], URETEROSCOPY AND STENT PLACEMENT;  Surgeon: Alexis Frock, MD;  Location: [facility];  Service: Urology;  Laterality: Right;   ELECTROPHYSIOLOGIC STUDY N/A 10/24/2016   Procedure: Atrial Fibrillation Ablation;  Surgeon: Will Meredith Leeds, MD;  Location: [employer];  Service: Cardiovascular;  Laterality: N/A;   EXTRACORPOREAL SHOCK WAVE LITHOTRIPSY  yrs ago   HEMORRHOID SURGERY N/A 10/24/2017   Procedure: HEMORRHOIDECTOMY;  Surgeon: Ileana Roup, MD;  Location: [place] [facility];  Service: General;  Laterality: N/A;   HERNIA REPAIR Bilateral 2000   Inguinal Hernia   IR GENERIC HISTORICAL  10/23/2016   IR FLUORO GUIDE CV LINE RIGHT 10/23/2016 Greggory Keen, MD [facility]   IR GENERIC HISTORICAL  10/23/2016   IR US GUIDE VASC ACCESS RIGHT 10/23/2016 Greggory Keen, MD [facility]   LEFT URETEROSCOPIC STONE EXTRACTION   04/10/2006   STONE EXTRACTION WITH BASKET Right 07/14/2015   Procedure: STONE EXTRACTION WITH BASKET;  Surgeon: Alexis Frock, MD;  Location: [facility];  Service: Urology;  Laterality: Right;   TEE WITHOUT CARDIOVERSION N/A 08/05/2016   Procedure: TRANSESOPHAGEAL ECHOCARDIOGRAM (TEE);  Surgeon: Sanda Klein, MD;  Location: [place];  Service: Cardiovascular;  Laterality: N/A;   TOTAL HIP ARTHROPLASTY Right 07/16/2022   Procedure: RIGHT TOTAL HIP ARTHROPLASTY ANTERIOR APPROACH;  Surgeon: Leandrew Koyanagi, MD;  Location: [place];  Service: Orthopedics;  Laterality: Right;  3-C   TRANSTHORACIC ECHOCARDIOGRAM  02/04/2012   grade I diastolic dysfunction/  ef 55-60%/  trivial MR and TR/  mild LAE   Social History   Occupational History   Not on file  Tobacco Use   Smoking status: Never   Smokeless tobacco: Never  Vaping Use   Vaping Use: Never used  Substance and Sexual Activity   Alcohol use: No    Comment: 2 beers a year   Drug use: No   Sexual activity: Not on file

## 2022-08-02 NOTE — Telephone Encounter (Signed)
Ortho bundle 14 day in office meeting completed. °

## 2022-08-14 DIAGNOSIS — L570 Actinic keratosis: Secondary | ICD-10-CM | POA: Diagnosis not present

## 2022-08-14 DIAGNOSIS — L218 Other seborrheic dermatitis: Secondary | ICD-10-CM | POA: Diagnosis not present

## 2022-08-14 DIAGNOSIS — L821 Other seborrheic keratosis: Secondary | ICD-10-CM | POA: Diagnosis not present

## 2022-08-14 DIAGNOSIS — C44519 Basal cell carcinoma of skin of other part of trunk: Secondary | ICD-10-CM | POA: Diagnosis not present

## 2022-08-14 DIAGNOSIS — D485 Neoplasm of uncertain behavior of skin: Secondary | ICD-10-CM | POA: Diagnosis not present

## 2022-08-14 DIAGNOSIS — Z85828 Personal history of other malignant neoplasm of skin: Secondary | ICD-10-CM | POA: Diagnosis not present

## 2022-08-21 ENCOUNTER — Telehealth: Payer: Self-pay | Admitting: *Deleted

## 2022-08-21 NOTE — Telephone Encounter (Signed)
Ortho bundle 30 day call completed. °

## 2022-09-03 ENCOUNTER — Ambulatory Visit (INDEPENDENT_AMBULATORY_CARE_PROVIDER_SITE_OTHER): Payer: Medicare HMO | Admitting: Orthopaedic Surgery

## 2022-09-03 ENCOUNTER — Ambulatory Visit (INDEPENDENT_AMBULATORY_CARE_PROVIDER_SITE_OTHER): Payer: Medicare HMO

## 2022-09-03 DIAGNOSIS — Z96641 Presence of right artificial hip joint: Secondary | ICD-10-CM | POA: Diagnosis not present

## 2022-09-03 NOTE — Progress Notes (Signed)
Post-Op Visit Note   Patient: Peter [name]           Date of Birth: [birth year]           MRN: [id number] Visit Date: 09/03/2022 PCP: [name], MD   Assessment & Plan:  Chief Complaint:  Chief Complaint  Patient presents with   Right Hip - Routine Post Op   Visit Diagnoses:  1. History of total hip replacement, right     Plan: Reford is 7 weeks status post right total hip replacement 07/16/2022.  Reports some start up stiffness.  He is now ambulating without any devices.  He has returned back to work at the [employer].  He has not taken any pain medications in quite some time.  He is very pleased with the outcome so far.  Examination of the right hip shows a fully healed surgical scar.  No signs of infection.  He has some residual weakness with hip flexion from a seated position.  He is walking quite well.  X-rays are unremarkable.  Pleased with Zebadiah's recovery so far.  He will continue to increase activity as tolerated.  Dental prophylaxis reinforced.  Recheck in 6 weeks.  Follow-Up Instructions: Return in about 6 weeks (around 10/15/2022).   Orders:  Orders Placed This Encounter  Procedures   XR Pelvis 1-2 Views   No orders of the defined types were placed in this encounter.   Imaging: XR Pelvis 1-2 Views  Result Date: 09/03/2022 Stable right total hip replacement without complications   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 thumb 04/16/2022   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   Chronic right shoulder pain 03/07/2022   Pain of right hand 03/07/2022   Diverticulitis 02/28/2018   RLQ abdominal pain    Sigmoid diverticulitis    Acute blood loss anemia 11/13/2017   Anticoagulation adequate 11/12/2016   Epistaxis, recurrent 11/12/2016   AF (atrial  fibrillation) ([place]) 10/24/2016   Obesity 08/09/2016   Atrial fibrillation with RVR ([place]) 08/03/2016   New onset a-fib ([place]) 07/16/2016   Persistent atrial fibrillation ([place]) 17/49/4496   Acute systolic (congestive) heart failure (HCC)    Ejection fraction    Iron deficiency anemia due to chronic blood loss 02/04/2012   Anemia 02/03/2012   CKD (chronic kidney disease), stage III ([place]) 02/03/2012   DOE (dyspnea on exertion) 01/28/2012   Hypertension    Coronary artery disease due to lipid rich plaque    Dyslipidemia    GERD (gastroesophageal reflux disease)    Past Medical History:  Diagnosis Date   Arthritis    At risk for sleep apnea    STOP-BANG= 5       SENT TO PCP 07-12-2015   CAD (coronary artery disease)    a. s/p PCI with DES x 3 to LAD in 2006 with bifurcation lesion and Plavix indefinitely was recommended.   Chronic combined systolic and diastolic CHF (congestive heart failure) ([place])    a. Prior EF normal but EF dropped to 30-35% in 07/2016 in setting of AFIB.   CKD (chronic kidney disease), stage III (HCC)    Depression    Dyslipidemia    Dyspnea    tired quick d/t Afib   Dyspnea on exertion    Dysrhythmia    A.FIB  GERD (gastroesophageal reflux disease)    History of GI bleed    History of kidney stones    History of MI (myocardial infarction)    11-05-2005--  periprocedural MI  (cardiac cath) per dr [name] note   Hypertension    Hypertriglyceridemia    Iron deficiency anemia    Myocardial infarction [facility])    Persistent atrial fibrillation ([place])    a. 07/2016, started on Eliquis -> readmitted later that month for AF RVR/acute HF - initially unsuccessful DCCV following TEE, so loaded with amio with repeat successful DCCV 07/2816.   Right ureteral stone    S/P drug eluting coronary stent placement    x3  to LAD  2006   Sleep apnea    wears BIPAP, 14 up to 24    Family History  Problem Relation Age of Onset   Heart attack Father 76   Congestive Heart Failure  Father 23   Cancer Other     Past Surgical History:  Procedure Laterality Date   CARDIOVASCULAR STRESS TEST  01-06-2013  dr [name]   normal perfusion study/  no ischemia or infarct/  normal LV function and wall motion , ef 57%   CARDIOVERSION N/A 08/05/2016   Procedure: CARDIOVERSION;  Surgeon: Sanda Klein, MD;  Location: [place];  Service: Cardiovascular;  Laterality: N/A;   CARDIOVERSION N/A 08/08/2016   Procedure: CARDIOVERSION;  Surgeon: Jerline Pain, MD;  Location: [place];  Service: Cardiovascular;  Laterality: N/A;   CORONARY ANGIOPLASTY  11-13-2005 dr [name]   Successful touch up post-dilatation of the 3 tandem overlying Cypher stents in proximal to mid LAD (based on IVUS 0% stenosis and patent diagonal branch)/  Cutting Balloon Angioplasty of Ramus branch   CORONARY ANGIOPLASTY WITH STENT PLACEMENT  11-05-2005   dr [name]   PCI and DES x3 to birfurcation lesion LAD and diagonal branch of LAD/   20% dLM,  30-40% RCA, ef 60%   CYSTOSCOPY WITH RETROGRADE PYELOGRAM, URETEROSCOPY AND STENT PLACEMENT Right 07/14/2015   Procedure: [place], URETEROSCOPY AND STENT PLACEMENT;  Surgeon: Alexis Frock, MD;  Location: [facility];  Service: Urology;  Laterality: Right;   ELECTROPHYSIOLOGIC STUDY N/A 10/24/2016   Procedure: Atrial Fibrillation Ablation;  Surgeon: Will Meredith Leeds, MD;  Location: [place] CV LAB;  Service: Cardiovascular;  Laterality: N/A;   EXTRACORPOREAL SHOCK WAVE LITHOTRIPSY  yrs ago   HEMORRHOID SURGERY N/A 10/24/2017   Procedure: HEMORRHOIDECTOMY;  Surgeon: Ileana Roup, MD;  Location: [place] [facility];  Service: General;  Laterality: N/A;   HERNIA REPAIR Bilateral 2000   Inguinal Hernia   IR GENERIC HISTORICAL  10/23/2016   IR FLUORO GUIDE CV LINE RIGHT 10/23/2016 Greggory Keen, MD [facility]   IR GENERIC HISTORICAL  10/23/2016   IR US GUIDE VASC ACCESS RIGHT 10/23/2016 Greggory Keen, MD [facility]    LEFT URETEROSCOPIC STONE EXTRACTION  04/10/2006   STONE EXTRACTION WITH BASKET Right 07/14/2015   Procedure: STONE EXTRACTION WITH BASKET;  Surgeon: Alexis Frock, MD;  Location[facility];  Service: Urology;  Laterality: Right;   TEE WITHOUT CARDIOVERSION N/A 08/05/2016   Procedure: TRANSESOPHAGEAL ECHOCARDIOGRAM (TEE);  Surgeon: Sanda Klein, MD;  Location: [place];  Service: Cardiovascular;  Laterality: N/A;   TOTAL HIP ARTHROPLASTY Right 07/16/2022   Procedure: RIGHT TOTAL HIP ARTHROPLASTY ANTERIOR APPROACH;  Surgeon: Leandrew Koyanagi, MD;  Location: [place];  Service: Orthopedics;  Laterality: Right;  3-C   TRANSTHORACIC ECHOCARDIOGRAM  02/04/2012  grade I diastolic dysfunction/  ef 55-60%/  trivial MR and TR/  mild LAE   Social History   Occupational History   Not on file  Tobacco Use   Smoking status: Never   Smokeless tobacco: Never  Vaping Use   Vaping Use: Never used  Substance and Sexual Activity   Alcohol use: No    Comment: 2 beers a year   Drug use: No   Sexual activity: Not on file

## 2022-09-26 ENCOUNTER — Telehealth: Payer: Self-pay | Admitting: *Deleted

## 2022-09-26 ENCOUNTER — Other Ambulatory Visit: Payer: Self-pay | Admitting: Physician Assistant

## 2022-09-26 MED ORDER — PREDNISONE 10 MG (21) PO TBPK
ORAL_TABLET | ORAL | 0 refills | Status: DC
Start: 2022-09-26 — End: 2023-01-14

## 2022-09-26 NOTE — Telephone Encounter (Signed)
Patient called stating he had to go to his dentist yesterday due to waking up and profusely bleeding from the mouth/gums. He states that he has always had some small pockets or "indentions" under his gum, but yesterday he was bleeding and went to the dentist where they told him he probably had food get under the gum and got infected. They cleaned and scraped and flushed with an antibiotic solution he states. They also gave him 4 tablets before the procedure (I'm assuming that was his antibiotic) and also sent him home with a large bottle of antibiotic mouth rinse twice daily. Do you think he needs any other antibiotics? Don't want to do to much, but just want to be safe.  He woke up this morning and states he is having a lot of pain in the joint and unable to bear too much weight. He is using a crutch to get around he says it hurts so badly all of a sudden. He has returned to work recently and been on it a lot. Don't know if the two are related. F/U with [place] is 10/15/22.  Also, he is going back in a few weeks to have a routine cleaning. Could we go ahead and send in his antibiotics for this now?

## 2022-09-26 NOTE — Telephone Encounter (Signed)
I spoke to dr. Erlinda Hong.  He does not think we need further oral abx.  As far as the pain, it's very unlikely infectious from the dental cleaning as it's just not been long enough from the cleaning to get an infection.  We have sent in a steroid taper instead of nsaids as he is on xarelto.  Back off activity and work as much as possible.  If he were to have worsening symptoms, fever chills or any signes of infection, please let us know

## 2022-09-26 NOTE — Telephone Encounter (Signed)
Patient made aware of following message from PA.

## 2022-10-15 ENCOUNTER — Ambulatory Visit (INDEPENDENT_AMBULATORY_CARE_PROVIDER_SITE_OTHER): Payer: Medicare HMO | Admitting: Orthopaedic Surgery

## 2022-10-15 ENCOUNTER — Telehealth: Payer: Self-pay | Admitting: *Deleted

## 2022-10-15 DIAGNOSIS — Z96641 Presence of right artificial hip joint: Secondary | ICD-10-CM

## 2022-10-15 NOTE — Progress Notes (Signed)
Post-Op Visit Note   Patient: Peter [name]           Date of Birth: 1946-06-01           MRN: [id number] Visit Date: 10/15/2022 PCP: [name], MD   Assessment & Plan:  Chief Complaint:  Chief Complaint  Patient presents with   Right Hip - Follow-up    Right total hip arthroplasty 07/16/2022   Visit Diagnoses:  1. Status post total replacement of right hip     Plan: Bogdan is 3 months status post right total hip replacement on 07/16/2022.  He is doing well overall the only complaint he has some discomfort when he swings his leg outward when he gets into a car.  Otherwise he is very pleased.  Examination of the right hip shows a fully healed surgical scar.  There is no evidence of infection.  No swelling.  He does have some limitation with hip abduction and internal rotation with some slight discomfort.  He has a good gait pattern.  From my standpoint Bryer has done well.  I think the discomfort will ultimately resolve.  He is now 3 months from the surgery.  Dental prophylaxis reinforced.  Activity as tolerated.  Recheck in 3 months with standing AP pelvis x-rays.  Follow-Up Instructions: No follow-ups on file.   Orders:  No orders of the defined types were placed in this encounter.  No orders of the defined types were placed in this encounter.   Imaging: No results found.  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 thumb 04/16/2022   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   Chronic right shoulder pain 03/07/2022   Pain of right hand 03/07/2022   Diverticulitis 02/28/2018   RLQ abdominal pain    Sigmoid diverticulitis    Acute blood loss anemia 11/13/2017   Anticoagulation adequate 11/12/2016   Epistaxis, recurrent 11/12/2016   AF (atrial fibrillation) ([place])  10/24/2016   Obesity 08/09/2016   Atrial fibrillation with RVR ([place]) 08/03/2016   New onset a-fib ([place]) 07/16/2016   Persistent atrial fibrillation ([place]) 83/38/2505   Acute systolic (congestive) heart failure (HCC)    Ejection fraction    Iron deficiency anemia due to chronic blood loss 02/04/2012   Anemia 02/03/2012   CKD (chronic kidney disease), stage III ([place]) 02/03/2012   DOE (dyspnea on exertion) 01/28/2012   Hypertension    Coronary artery disease due to lipid rich plaque    Dyslipidemia    GERD (gastroesophageal reflux disease)    Past Medical History:  Diagnosis Date   Arthritis    At risk for sleep apnea    STOP-BANG= 5       SENT TO PCP 07-12-2015   CAD (coronary artery disease)    a. s/p PCI with DES x 3 to LAD in 2006 with bifurcation lesion and Plavix indefinitely was recommended.   Chronic combined systolic and diastolic CHF (congestive heart failure) ([place])    a. Prior EF normal but EF dropped to 30-35% in 07/2016 in setting of AFIB.   CKD (chronic kidney disease), stage III (HCC)    Depression    Dyslipidemia    Dyspnea    tired quick d/t Afib   Dyspnea on exertion    Dysrhythmia    A.FIB   GERD (gastroesophageal  reflux disease)    History of GI bleed    History of kidney stones    History of MI (myocardial infarction)    11-05-2005--  periprocedural MI  (cardiac cath) per dr [name] note   Hypertension    Hypertriglyceridemia    Iron deficiency anemia    Myocardial infarction [facility])    Persistent atrial fibrillation ([place])    a. 07/2016, started on Eliquis -> readmitted later that month for AF RVR/acute HF - initially unsuccessful DCCV following TEE, so loaded with amio with repeat successful DCCV 07/2816.   Right ureteral stone    S/P drug eluting coronary stent placement    x3  to LAD  2006   Sleep apnea    wears BIPAP, 14 up to 24    Family History  Problem Relation Age of Onset   Heart attack Father 48   Congestive Heart Failure Father 43   Cancer  Other     Past Surgical History:  Procedure Laterality Date   CARDIOVASCULAR STRESS TEST  01-06-2013  dr [name]   normal perfusion study/  no ischemia or infarct/  normal LV function and wall motion , ef 57%   CARDIOVERSION N/A 08/05/2016   Procedure: CARDIOVERSION;  Surgeon: Sanda Klein, MD;  Location: [place];  Service: Cardiovascular;  Laterality: N/A;   CARDIOVERSION N/A 08/08/2016   Procedure: CARDIOVERSION;  Surgeon: Jerline Pain, MD;  Location: [place];  Service: Cardiovascular;  Laterality: N/A;   CORONARY ANGIOPLASTY  11-13-2005 dr [name]   Successful touch up post-dilatation of the 3 tandem overlying Cypher stents in proximal to mid LAD (based on IVUS 0% stenosis and patent diagonal branch)/  Cutting Balloon Angioplasty of Ramus branch   CORONARY ANGIOPLASTY WITH STENT PLACEMENT  11-05-2005   dr [name]   PCI and DES x3 to birfurcation lesion LAD and diagonal branch of LAD/   20% dLM,  30-40% RCA, ef 60%   CYSTOSCOPY WITH RETROGRADE PYELOGRAM, URETEROSCOPY AND STENT PLACEMENT Right 07/14/2015   Procedure: [place], URETEROSCOPY AND STENT PLACEMENT;  Surgeon: Alexis Frock, MD;  Location[facility];  Service: Urology;  Laterality: Right;   ELECTROPHYSIOLOGIC STUDY N/A 10/24/2016   Procedure: Atrial Fibrillation Ablation;  Surgeon: Will Meredith Leeds, MD;  Location: [place] CV LAB;  Service: Cardiovascular;  Laterality: N/A;   EXTRACORPOREAL SHOCK WAVE LITHOTRIPSY  yrs ago   HEMORRHOID SURGERY N/A 10/24/2017   Procedure: HEMORRHOIDECTOMY;  Surgeon: Ileana Roup, MD;  Location: [place] [facility];  Service: General;  Laterality: N/A;   HERNIA REPAIR Bilateral 2000   Inguinal Hernia   IR GENERIC HISTORICAL  10/23/2016   IR FLUORO GUIDE CV LINE RIGHT 10/23/2016 Greggory Keen, MD [facility]   IR GENERIC HISTORICAL  10/23/2016   IR US GUIDE VASC ACCESS RIGHT 10/23/2016 Greggory Keen, MD [facility]   LEFT URETEROSCOPIC  STONE EXTRACTION  04/10/2006   STONE EXTRACTION WITH BASKET Right 07/14/2015   Procedure: STONE EXTRACTION WITH BASKET;  Surgeon: Alexis Frock, MD;  Location: [facility];  Service: Urology;  Laterality: Right;   TEE WITHOUT CARDIOVERSION N/A 08/05/2016   Procedure: TRANSESOPHAGEAL ECHOCARDIOGRAM (TEE);  Surgeon: Sanda Klein, MD;  Location: [place];  Service: Cardiovascular;  Laterality: N/A;   TOTAL HIP ARTHROPLASTY Right 07/16/2022   Procedure: RIGHT TOTAL HIP ARTHROPLASTY ANTERIOR APPROACH;  Surgeon: Leandrew Koyanagi, MD;  Location: [place];  Service: Orthopedics;  Laterality: Right;  3-C   TRANSTHORACIC ECHOCARDIOGRAM  02/04/2012   grade  I diastolic dysfunction/  ef 55-60%/  trivial MR and TR/  mild LAE   Social History   Occupational History   Not on file  Tobacco Use   Smoking status: Never   Smokeless tobacco: Never  Vaping Use   Vaping Use: Never used  Substance and Sexual Activity   Alcohol use: No    Comment: 2 beers a year   Drug use: No   Sexual activity: Not on file

## 2022-10-15 NOTE — Telephone Encounter (Signed)
Ortho bundle 90 day call completed. 

## 2022-11-25 ENCOUNTER — Other Ambulatory Visit: Payer: Self-pay | Admitting: Cardiology

## 2022-11-25 DIAGNOSIS — I480 Paroxysmal atrial fibrillation: Secondary | ICD-10-CM

## 2022-11-25 MED ORDER — RIVAROXABAN 20 MG PO TABS: 20.0000 mg | ORAL_TABLET | Freq: Every day | ORAL | 3 refills | Status: DC

## 2022-11-25 MED ORDER — POTASSIUM CHLORIDE CRYS ER 20 MEQ PO TBCR: 20.0000 meq | EXTENDED_RELEASE_TABLET | Freq: Every day | ORAL | 0 refills | Status: DC

## 2022-11-25 MED ORDER — LISINOPRIL 40 MG PO TABS: 40.0000 mg | ORAL_TABLET | Freq: Every day | ORAL | 0 refills | Status: DC

## 2022-11-25 MED ORDER — ATORVASTATIN CALCIUM 40 MG PO TABS: 40.0000 mg | ORAL_TABLET | Freq: Every morning | ORAL | 0 refills | Status: DC

## 2022-11-25 MED ORDER — NITROGLYCERIN 0.4 MG SL SUBL: 0.4000 mg | SUBLINGUAL_TABLET | SUBLINGUAL | 0 refills | Status: DC | PRN

## 2022-11-25 MED ORDER — SPIRONOLACTONE 25 MG PO TABS: 25.0000 mg | ORAL_TABLET | Freq: Every day | ORAL | 0 refills | Status: DC

## 2022-11-25 NOTE — Telephone Encounter (Signed)
Prescription refill request for Xarelto received.  Indication: Afib  Last office visit: 05/21/22 ([place])  Weight: 94.8kg Age: 77 Scr: 1.18 (07/30/22)  CrCl: 70.23m/min  Appropriate dose and refill sent to requested pharmacy.

## 2022-12-03 ENCOUNTER — Telehealth: Payer: Self-pay | Admitting: *Deleted

## 2022-12-03 ENCOUNTER — Telehealth: Payer: Self-pay | Admitting: Cardiovascular Disease

## 2022-12-03 NOTE — Telephone Encounter (Signed)
S/w pt due to pt calling in on refill line about 7 scripts.  Theses have all been sent into [facility] [facility] on Jan 15. Pt stated Dr. Evette Georges office and [facility] called pt to clarify.

## 2022-12-03 NOTE — Telephone Encounter (Signed)
Pt c/o medication issue:  1. Name of Medication:   rivaroxaban (XARELTO) 20 MG TABS tablet    2. How are you currently taking this medication (dosage and times per day)?   3. Are you having a reaction (difficulty breathing--STAT)?   4. What is your medication issue? Pt states he spoke to [facility] and they told him they don't have his medication. Confirmation was sent last week and pt states he paid them extra to have it shipped over night but he is almost out. Pt is unsure what to do, please advise.

## 2022-12-03 NOTE — Telephone Encounter (Signed)
Called patient, LVM advising that I spoke with [facility] pharmacy- they sent out his medication on 01/18 and should be arriving to him soon.   Advised patient to call back with any other questions/concerns. Left call back number.

## 2022-12-16 DIAGNOSIS — R42 Dizziness and giddiness: Secondary | ICD-10-CM | POA: Diagnosis not present

## 2022-12-16 DIAGNOSIS — N1831 Chronic kidney disease, stage 3a: Secondary | ICD-10-CM | POA: Diagnosis not present

## 2022-12-16 DIAGNOSIS — I480 Paroxysmal atrial fibrillation: Secondary | ICD-10-CM | POA: Diagnosis not present

## 2022-12-17 ENCOUNTER — Other Ambulatory Visit: Payer: Self-pay | Admitting: Cardiology

## 2022-12-18 ENCOUNTER — Other Ambulatory Visit: Payer: Self-pay | Admitting: Cardiology

## 2022-12-24 ENCOUNTER — Telehealth: Payer: Self-pay | Admitting: Cardiology

## 2022-12-24 MED ORDER — PANTOPRAZOLE SODIUM 40 MG PO TBEC: 40.0000 mg | DELAYED_RELEASE_TABLET | Freq: Every day | ORAL | 0 refills | Status: DC

## 2022-12-24 MED ORDER — FUROSEMIDE 40 MG PO TABS: 40.0000 mg | ORAL_TABLET | Freq: Every day | ORAL | 0 refills | Status: DC

## 2022-12-24 MED ORDER — CARVEDILOL 25 MG PO TABS: 25.0000 mg | ORAL_TABLET | Freq: Two times a day (BID) | ORAL | 0 refills | Status: DC

## 2022-12-24 NOTE — Telephone Encounter (Signed)
Pt's medications were sent to pt's pharmacy as requested. Confirmation received.  

## 2022-12-24 NOTE — Telephone Encounter (Signed)
Called stating that new prescriptions for the below medications need to be sent. Please advise   carvedilol (COREG) 25 MG tablet    furosemide (LASIX) 40 MG tablet   pantoprazole (PROTONIX) 40 MG tablet

## 2022-12-30 DIAGNOSIS — Z03818 Encounter for observation for suspected exposure to other biological agents ruled out: Secondary | ICD-10-CM | POA: Diagnosis not present

## 2022-12-30 DIAGNOSIS — R52 Pain, unspecified: Secondary | ICD-10-CM | POA: Diagnosis not present

## 2022-12-30 DIAGNOSIS — R059 Cough, unspecified: Secondary | ICD-10-CM | POA: Diagnosis not present

## 2022-12-30 DIAGNOSIS — J069 Acute upper respiratory infection, unspecified: Secondary | ICD-10-CM | POA: Diagnosis not present

## 2023-01-03 ENCOUNTER — Other Ambulatory Visit: Payer: Self-pay | Admitting: Cardiology

## 2023-01-06 ENCOUNTER — Other Ambulatory Visit: Payer: Self-pay | Admitting: Cardiology

## 2023-01-14 ENCOUNTER — Ambulatory Visit: Payer: Medicare HMO | Admitting: Orthopaedic Surgery

## 2023-01-14 ENCOUNTER — Encounter: Payer: Self-pay | Admitting: Orthopaedic Surgery

## 2023-01-14 ENCOUNTER — Ambulatory Visit (INDEPENDENT_AMBULATORY_CARE_PROVIDER_SITE_OTHER): Payer: Medicare HMO

## 2023-01-14 DIAGNOSIS — Z96641 Presence of right artificial hip joint: Secondary | ICD-10-CM

## 2023-01-14 DIAGNOSIS — I509 Heart failure, unspecified: Secondary | ICD-10-CM | POA: Diagnosis not present

## 2023-01-14 NOTE — Progress Notes (Signed)
Office Visit Note   Patient: Peter [name]           Date of Birth: February 22, 1946           MRN: [id number] Visit Date: 01/14/2023              Requested by: [name], MD [place],  [place] [id number] PCP: [name], MD   Assessment & Plan: Visit Diagnoses:  1. Status post total replacement of right hip     Plan: Peter [name] is 6 months status post right total hip replacement.  He is doing very well and has been very happy.  He has no complaints.  He will call back next month to get antibiotics for his upcoming dentist appointment.  Otherwise we will see him back in 6 months with standing AP pelvis x-rays.  Follow-Up Instructions: Return in about 6 months (around 07/17/2023).   Orders:  Orders Placed This Encounter  Procedures   XR Pelvis 1-2 Views   No orders of the defined types were placed in this encounter.     Procedures: No procedures performed   Clinical Data: No additional findings.   Subjective: Chief Complaint  Patient presents with   Right Hip - Follow-up    Right total hip arthroplasty 07/16/2022    HPI  Peter [name] is 6 months status post right total hip replacement.  Very satisfied.  Review of Systems   Objective: Vital Signs: There were no vitals taken for this visit.  Physical Exam  Ortho Exam  Examination shows fully healed surgical scar.  Fluid painless range of motion of the hip.  Normal gait and ambulation.  Specialty Comments:  No specialty comments available.  Imaging: XR Pelvis 1-2 Views  Result Date: 01/14/2023 Stable right total hip replacement without complications    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 thumb 04/16/2022   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   Chronic right shoulder pain  03/07/2022   Pain of right hand 03/07/2022   Diverticulitis 02/28/2018   RLQ abdominal pain    Sigmoid diverticulitis    Acute blood loss anemia 11/13/2017   Anticoagulation adequate 11/12/2016   Epistaxis, recurrent 11/12/2016   AF (atrial fibrillation) ([place]) 10/24/2016   Obesity 08/09/2016   Atrial fibrillation with RVR ([place]) 08/03/2016   New onset a-fib ([place]) 07/16/2016   Persistent atrial fibrillation ([place]) 0000000   Acute systolic (congestive) heart failure (HCC)    Ejection fraction    Iron deficiency anemia due to chronic blood loss 02/04/2012   Anemia 02/03/2012   CKD (chronic kidney disease), stage III ([place]) 02/03/2012   DOE (dyspnea on exertion) 01/28/2012   Hypertension    Coronary artery disease due to lipid rich plaque    Dyslipidemia    GERD (gastroesophageal reflux disease)    Past Medical History:  Diagnosis Date   Arthritis    At risk for sleep apnea    STOP-BANG= 5       SENT TO PCP 07-12-2015   CAD (coronary artery disease)    a. s/p PCI with DES x 3 to LAD in 2006 with bifurcation lesion and Plavix indefinitely was recommended.   Chronic combined systolic and diastolic CHF (congestive heart failure) ([place])    a. Prior EF normal but EF dropped  to 30-35% in 07/2016 in setting of AFIB.   CKD (chronic kidney disease), stage III (HCC)    Depression    Dyslipidemia    Dyspnea    tired quick d/t Afib   Dyspnea on exertion    Dysrhythmia    A.FIB   GERD (gastroesophageal reflux disease)    History of GI bleed    History of kidney stones    History of MI (myocardial infarction)    11-05-2005--  periprocedural MI  (cardiac cath) per dr [name] note   Hypertension    Hypertriglyceridemia    Iron deficiency anemia    Myocardial infarction [facility])    Persistent atrial fibrillation ([place])    a. 07/2016, started on Eliquis -> readmitted later that month for AF RVR/acute HF - initially unsuccessful DCCV following TEE, so loaded with amio with repeat successful DCCV  07/2816.   Right ureteral stone    S/P drug eluting coronary stent placement    x3  to LAD  2006   Sleep apnea    wears BIPAP, 14 up to 24    Family History  Problem Relation Age of Onset   Heart attack Father 8   Congestive Heart Failure Father 89   Cancer Other     Past Surgical History:  Procedure Laterality Date   CARDIOVASCULAR STRESS TEST  01-06-2013  dr [name]   normal perfusion study/  no ischemia or infarct/  normal LV function and wall motion , ef 57%   CARDIOVERSION N/A 08/05/2016   Procedure: CARDIOVERSION;  Surgeon: Sanda Klein, MD;  Location: [place];  Service: Cardiovascular;  Laterality: N/A;   CARDIOVERSION N/A 08/08/2016   Procedure: CARDIOVERSION;  Surgeon: Jerline Pain, MD;  Location: [place];  Service: Cardiovascular;  Laterality: N/A;   CORONARY ANGIOPLASTY  11-13-2005 dr [name]   Successful touch up post-dilatation of the 3 tandem overlying Cypher stents in proximal to mid LAD (based on IVUS 0% stenosis and patent diagonal branch)/  Cutting Balloon Angioplasty of Ramus branch   CORONARY ANGIOPLASTY WITH STENT PLACEMENT  11-05-2005   dr [name]   PCI and DES x3 to birfurcation lesion LAD and diagonal branch of LAD/   20% dLM,  30-40% RCA, ef 60%   CYSTOSCOPY WITH RETROGRADE PYELOGRAM, URETEROSCOPY AND STENT PLACEMENT Right 07/14/2015   Procedure: [place], URETEROSCOPY AND STENT PLACEMENT;  Surgeon: Alexis Frock, MD;  [facility];  Service: Urology;  Laterality: Right;   ELECTROPHYSIOLOGIC STUDY N/A 10/24/2016   Procedure: Atrial Fibrillation Ablation;  Surgeon: Will Meredith Leeds, MD;  Location: [place] [facility];  Service: Cardiovascular;  Laterality: N/A;   EXTRACORPOREAL SHOCK WAVE LITHOTRIPSY  yrs ago   HEMORRHOID SURGERY N/A 10/24/2017   Procedure: HEMORRHOIDECTOMY;  Surgeon: Ileana Roup, MD;  Location: [place] [facility];  Service: General;  Laterality: N/A;   HERNIA REPAIR Bilateral  2000   Inguinal Hernia   IR GENERIC HISTORICAL  10/23/2016   IR FLUORO GUIDE CV LINE RIGHT 10/23/2016 Greggory Keen, MD [facility]   IR GENERIC HISTORICAL  10/23/2016   IR US GUIDE VASC ACCESS RIGHT 10/23/2016 Greggory Keen, MD [facility]   LEFT URETEROSCOPIC STONE EXTRACTION  04/10/2006   STONE EXTRACTION WITH BASKET Right 07/14/2015   Procedure: STONE EXTRACTION WITH BASKET;  Surgeon: Alexis Frock, MD;  [facility];  Service: Urology;  Laterality: Right;   TEE WITHOUT CARDIOVERSION N/A 08/05/2016   Procedure: TRANSESOPHAGEAL ECHOCARDIOGRAM (TEE);  Surgeon: Sanda Klein, MD;  [facility];  Service: Cardiovascular;  Laterality: N/A;   TOTAL HIP ARTHROPLASTY Right 07/16/2022   Procedure: RIGHT TOTAL HIP ARTHROPLASTY ANTERIOR APPROACH;  Surgeon: Leandrew Koyanagi, MD;  Location: [place];  Service: Orthopedics;  Laterality: Right;  3-C   TRANSTHORACIC ECHOCARDIOGRAM  02/04/2012   grade I diastolic dysfunction/  ef 55-60%/  trivial MR and TR/  mild LAE   Social History   Occupational History   Not on file  Tobacco Use   Smoking status: Never   Smokeless tobacco: Never  Vaping Use   Vaping Use: Never used  Substance and Sexual Activity   Alcohol use: No    Comment: 2 beers a year   Drug use: No   Sexual activity: Not on file

## 2023-01-18 ENCOUNTER — Other Ambulatory Visit: Payer: Self-pay | Admitting: Cardiology

## 2023-01-31 ENCOUNTER — Telehealth: Payer: Self-pay | Admitting: Cardiology

## 2023-01-31 ENCOUNTER — Other Ambulatory Visit: Payer: Self-pay

## 2023-01-31 MED ORDER — CARVEDILOL 25 MG PO TABS: ORAL_TABLET | ORAL | 0 refills | Status: DC

## 2023-01-31 MED ORDER — FUROSEMIDE 40 MG PO TABS: ORAL_TABLET | ORAL | 0 refills | Status: DC

## 2023-01-31 MED ORDER — PANTOPRAZOLE SODIUM 40 MG PO TBEC: DELAYED_RELEASE_TABLET | ORAL | 0 refills | Status: DC

## 2023-01-31 NOTE — Telephone Encounter (Signed)
Pt's medications were sent to pt's pharmacy as requested. Confirmation received.  

## 2023-01-31 NOTE — Telephone Encounter (Signed)
*  STAT* If patient is at the pharmacy, call can be transferred to refill team.   1. Which medications need to be refilled? (please list name of each medication and dose if known) carvedilol (COREG) 25 MG tablet furosemide (LASIX) 40 MG tablet pantoprazole (PROTONIX) 40 MG tablet  2. Which pharmacy/location (including street and city if local pharmacy) is medication to be sent to?  [place], [place]    3. Do they need a 30 day or 90 day supply? [place]

## 2023-03-11 DIAGNOSIS — N1831 Chronic kidney disease, stage 3a: Secondary | ICD-10-CM | POA: Diagnosis not present

## 2023-03-11 DIAGNOSIS — N644 Mastodynia: Secondary | ICD-10-CM | POA: Diagnosis not present

## 2023-03-11 DIAGNOSIS — I10 Essential (primary) hypertension: Secondary | ICD-10-CM | POA: Diagnosis not present

## 2023-03-27 ENCOUNTER — Other Ambulatory Visit: Payer: Self-pay | Admitting: Cardiology

## 2023-04-09 ENCOUNTER — Telehealth: Payer: Self-pay | Admitting: Cardiovascular Disease

## 2023-04-09 DIAGNOSIS — K219 Gastro-esophageal reflux disease without esophagitis: Secondary | ICD-10-CM | POA: Diagnosis not present

## 2023-04-09 DIAGNOSIS — E7800 Pure hypercholesterolemia, unspecified: Secondary | ICD-10-CM | POA: Diagnosis not present

## 2023-04-09 DIAGNOSIS — I10 Essential (primary) hypertension: Secondary | ICD-10-CM | POA: Diagnosis not present

## 2023-04-09 DIAGNOSIS — N1831 Chronic kidney disease, stage 3a: Secondary | ICD-10-CM | POA: Diagnosis not present

## 2023-04-09 MED ORDER — NITROGLYCERIN 0.4 MG SL SUBL: 0.4000 mg | SUBLINGUAL_TABLET | SUBLINGUAL | 5 refills | Status: DC | PRN

## 2023-04-09 NOTE — Telephone Encounter (Signed)
Left voicemail for patient to return call to office. 

## 2023-04-09 NOTE — Telephone Encounter (Signed)
Pt c/o medication issue:  1. Name of Medication:   furosemide (LASIX) 40 MG tablet   2. How are you currently taking this medication (dosage and times per day)?   3. Are you having a reaction (difficulty breathing--STAT)?   4. What is your medication issue?   Caller stated patient is only taking this medication 4 days weekly and does not take any medication on the days he is working.  Caller stated can follow-up directly with patient.

## 2023-04-09 NOTE — Telephone Encounter (Signed)
*  STAT* If patient is at the pharmacy, call can be transferred to refill team.   1. Which medications need to be refilled? (please list name of each medication and dose if known)   nitroGLYCERIN (NITROSTAT) 0.4 MG SL tablet   2. Which pharmacy/location (including street and city if local pharmacy) is medication to be sent to?  [id number] - [place], [place] - [address]   3. Do they need a 30 day or 90 day supply?   Caller is not sure if patient still has medication left.

## 2023-04-09 NOTE — Telephone Encounter (Signed)
Refills has been sent to the pharmacy. 

## 2023-04-10 NOTE — Telephone Encounter (Signed)
FYI for refills pertaining to Lasix or Nitro --- these should go through general cardiologist, not Dr. Elberta Fortis.

## 2023-04-10 NOTE — Telephone Encounter (Signed)
Call to [facility], CMA  with [facility].  She ask regard Dr Elberta Fortis and refill of nitro tablets.  Had her review the message as it states information regarding Lasix.  She states that patient saw their pharmacist Scott,and that they want doctor to be aware that patient only taking Lasix 4 times a day. States patient does not take on days he works, no information on what those days are. "Lorin Picket' stated in notes,  no evidence of  fluid retention nor SOB. She then ask for nitro to be refilled and advised it was sent yesterday with a note that patient needs follow up prior to any further refills.  Also noted he is set for July. Nothing further needed.

## 2023-04-17 ENCOUNTER — Other Ambulatory Visit: Payer: Self-pay | Admitting: Cardiology

## 2023-04-29 ENCOUNTER — Ambulatory Visit: Payer: Medicare HMO | Admitting: Orthopaedic Surgery

## 2023-04-29 DIAGNOSIS — M1811 Unilateral primary osteoarthritis of first carpometacarpal joint, right hand: Secondary | ICD-10-CM | POA: Diagnosis not present

## 2023-04-29 MED ORDER — LIDOCAINE HCL 1 % IJ SOLN
0.3000 mL | INTRAMUSCULAR | Status: AC | PRN
  Administered 2023-04-29: .3 mL

## 2023-04-29 MED ORDER — METHYLPREDNISOLONE ACETATE 40 MG/ML IJ SUSP
0.3000 mg | INTRAMUSCULAR | Status: AC | PRN
Start: 2023-04-29 — End: 2023-04-29
  Administered 2023-04-29: .3 mg

## 2023-04-29 MED ORDER — BUPIVACAINE HCL 0.5 % IJ SOLN
0.3000 mL | INTRAMUSCULAR | Status: AC | PRN
  Administered 2023-04-29: .3 mL

## 2023-04-29 NOTE — Progress Notes (Signed)
Office Visit Note   Patient: Peter [name]           Date of Birth: 1946/04/09           MRN: [id number] Visit Date: 04/29/2023              Requested by: Darrow Bussing, MD [address] [place],  [place] [address] PCP: Darrow Bussing, MD   Assessment & Plan: Visit Diagnoses:  1. Arthritis of carpometacarpal (CMC) joint of right thumb     Plan: Impression is recurrent right thumb CMC OA.  CMC injection performed today.  He tolerates well.  Will see him back as needed.  Follow-Up Instructions: No follow-ups on file.   Orders:  No orders of the defined types were placed in this encounter.  No orders of the defined types were placed in this encounter.     Procedures: Hand/UE Inj: R thumb CMC for osteoarthritis on 04/29/2023 9:29 AM Indications: pain Details: 25 G needle Medications: 0.3 mL lidocaine 1 %; 0.3 mg methylPREDNISolone acetate 40 MG/ML; 0.3 mL bupivacaine 0.5 % Outcome: tolerated well, no immediate complications Patient was prepped and draped in the usual sterile fashion.       Clinical Data: No additional findings.   Subjective: Chief Complaint  Patient presents with   Right Hand - Pain    HPI [name] returns today for follow-up on right thumb CMC OA.  He saw Dr. Frazier Butt last year and they did a CMC injection which helped for entire year.  His symptoms have returned and is requesting another injection.  He has been wearing a supportive brace in the meantime. Review of Systems   Objective: Vital Signs: There were no vitals taken for this visit.  Physical Exam  Ortho Exam Exam of the right hand is unchanged. Specialty Comments:  No specialty comments available.  Imaging: No results found.   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  thumb 04/16/2022   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   Chronic right shoulder pain 03/07/2022   Pain of right hand 03/07/2022   Diverticulitis 02/28/2018   RLQ abdominal pain    Sigmoid diverticulitis    Acute blood loss anemia 11/13/2017   Anticoagulation adequate 11/12/2016   Epistaxis, recurrent 11/12/2016   AF (atrial fibrillation) (HCC) 10/24/2016   Obesity 08/09/2016   Atrial fibrillation with RVR (HCC) 08/03/2016   New onset a-fib (HCC) 07/16/2016   Persistent atrial fibrillation (HCC) 07/16/2016   Acute systolic (congestive) heart failure (HCC)    Ejection fraction    Iron deficiency anemia due to chronic blood loss 02/04/2012   Anemia 02/03/2012   CKD (chronic kidney disease), stage III (HCC) 02/03/2012   DOE (dyspnea on exertion) 01/28/2012   Hypertension    Coronary artery disease due to lipid rich plaque    Dyslipidemia    GERD (gastroesophageal reflux disease)    Past Medical History:  Diagnosis Date   Arthritis    At risk for sleep apnea    STOP-BANG= 5       SENT TO PCP 07-12-2015   CAD (coronary artery disease)    a. s/p PCI with DES x 3 to LAD in 2006 with bifurcation lesion and Plavix indefinitely was recommended.   Chronic combined systolic and diastolic CHF (congestive heart  failure) (HCC)    a. Prior EF normal but EF dropped to 30-35% in 07/2016 in setting of AFIB.   CKD (chronic kidney disease), stage III (HCC)    Depression    Dyslipidemia    Dyspnea    tired quick d/t Afib   Dyspnea on exertion    Dysrhythmia    A.FIB   GERD (gastroesophageal reflux disease)    History of GI bleed    History of kidney stones    History of MI (myocardial infarction)    11-05-2005--  periprocedural MI  (cardiac cath) per dr Juanda Chance note   Hypertension    Hypertriglyceridemia    Iron deficiency anemia    Myocardial infarction [facility])    Persistent atrial fibrillation (HCC)    a. 07/2016, started on Eliquis -> readmitted later that month for AF  RVR/acute HF - initially unsuccessful DCCV following TEE, so loaded with amio with repeat successful DCCV 07/2816.   Right ureteral stone    S/P drug eluting coronary stent placement    x3  to LAD  2006   Sleep apnea    wears BIPAP, 14 up to 24    Family History  Problem Relation Age of Onset   Heart attack Father 56   Congestive Heart Failure Father 48   Cancer Other     Past Surgical History:  Procedure Laterality Date   CARDIOVASCULAR STRESS TEST  01-06-2013  dr Myrtis Ser   normal perfusion study/  no ischemia or infarct/  normal LV function and wall motion , ef 57%   CARDIOVERSION N/A 08/05/2016   Procedure: CARDIOVERSION;  Surgeon: Thurmon Fair, MD;  [facility];  Service: Cardiovascular;  Laterality: N/A;   CARDIOVERSION N/A 08/08/2016   Procedure: CARDIOVERSION;  Surgeon: Jake Bathe, MD;  [facility];  Service: Cardiovascular;  Laterality: N/A;   CORONARY ANGIOPLASTY  11-13-2005 dr Juanda Chance   Successful touch up post-dilatation of the 3 tandem overlying Cypher stents in proximal to mid LAD (based on IVUS 0% stenosis and patent diagonal branch)/  Cutting Balloon Angioplasty of Ramus branch   CORONARY ANGIOPLASTY WITH STENT PLACEMENT  11-05-2005   dr Smitty Cords brodie   PCI and DES x3 to birfurcation lesion LAD and diagonal branch of LAD/   20% dLM,  30-40% RCA, ef 60%   CYSTOSCOPY WITH RETROGRADE PYELOGRAM, URETEROSCOPY AND STENT PLACEMENT Right 07/14/2015   Procedure: CYSTOSCOPY WITH RETROGRADE PYELOGRAM, URETEROSCOPY AND STENT PLACEMENT;  Surgeon: Sebastian Ache, MD;  [facility];  Service: Urology;  Laterality: Right;   ELECTROPHYSIOLOGIC STUDY N/A 10/24/2016   Procedure: Atrial Fibrillation Ablation;  Surgeon: Will Jorja Loa, MD;  [facility];  Service: Cardiovascular;  Laterality: N/A;   EXTRACORPOREAL SHOCK WAVE LITHOTRIPSY  yrs ago   HEMORRHOID SURGERY N/A 10/24/2017   Procedure: HEMORRHOIDECTOMY;  Surgeon:  Andria Meuse, MD;  [facility];  Service: General;  Laterality: N/A;   HERNIA REPAIR Bilateral 2000   Inguinal Hernia   IR GENERIC HISTORICAL  10/23/2016   IR FLUORO GUIDE CV LINE RIGHT 10/23/2016 Berdine Dance, MD [facility]   IR GENERIC HISTORICAL  10/23/2016   IR US GUIDE VASC ACCESS RIGHT 10/23/2016 Berdine Dance, MD [facility]   LEFT URETEROSCOPIC STONE EXTRACTION  04/10/2006   STONE EXTRACTION WITH BASKET Right 07/14/2015   Procedure: STONE EXTRACTION WITH BASKET;  Surgeon: Sebastian Ache, MD;  Location[facility];  Service: Urology;  Laterality: Right;   TEE WITHOUT CARDIOVERSION N/A  08/05/2016   Procedure: TRANSESOPHAGEAL ECHOCARDIOGRAM (TEE);  Surgeon: Thurmon Fair, MD;  Location: [facility] ENDOSCOPY;  Service: Cardiovascular;  Laterality: N/A;   TOTAL HIP ARTHROPLASTY Right 07/16/2022   Procedure: RIGHT TOTAL HIP ARTHROPLASTY ANTERIOR APPROACH;  Surgeon: Tarry Kos, MD;  [facility];  Service: Orthopedics;  Laterality: Right;  3-C   TRANSTHORACIC ECHOCARDIOGRAM  02/04/2012   grade I diastolic dysfunction/  ef 55-60%/  trivial MR and TR/  mild LAE   Social History   Occupational History   Not on file  Tobacco Use   Smoking status: Never   Smokeless tobacco: Never  Vaping Use   Vaping Use: Never used  Substance and Sexual Activity   Alcohol use: No    Comment: 2 beers a year   Drug use: No   Sexual activity: Not on file

## 2023-05-26 DIAGNOSIS — N1831 Chronic kidney disease, stage 3a: Secondary | ICD-10-CM | POA: Diagnosis not present

## 2023-05-26 DIAGNOSIS — I2510 Atherosclerotic heart disease of native coronary artery without angina pectoris: Secondary | ICD-10-CM | POA: Diagnosis not present

## 2023-05-26 DIAGNOSIS — K219 Gastro-esophageal reflux disease without esophagitis: Secondary | ICD-10-CM | POA: Diagnosis not present

## 2023-05-26 DIAGNOSIS — D6869 Other thrombophilia: Secondary | ICD-10-CM | POA: Diagnosis not present

## 2023-05-26 DIAGNOSIS — Z0001 Encounter for general adult medical examination with abnormal findings: Secondary | ICD-10-CM | POA: Diagnosis not present

## 2023-05-26 DIAGNOSIS — I4819 Other persistent atrial fibrillation: Secondary | ICD-10-CM | POA: Diagnosis not present

## 2023-05-26 DIAGNOSIS — E7800 Pure hypercholesterolemia, unspecified: Secondary | ICD-10-CM | POA: Diagnosis not present

## 2023-05-26 DIAGNOSIS — I700 Atherosclerosis of aorta: Secondary | ICD-10-CM | POA: Diagnosis not present

## 2023-05-26 DIAGNOSIS — R7301 Impaired fasting glucose: Secondary | ICD-10-CM | POA: Diagnosis not present

## 2023-05-26 DIAGNOSIS — Z79899 Other long term (current) drug therapy: Secondary | ICD-10-CM | POA: Diagnosis not present

## 2023-05-26 DIAGNOSIS — I428 Other cardiomyopathies: Secondary | ICD-10-CM | POA: Diagnosis not present

## 2023-06-04 ENCOUNTER — Encounter: Payer: Self-pay | Admitting: Cardiovascular Disease

## 2023-06-04 ENCOUNTER — Ambulatory Visit: Payer: Medicare HMO | Attending: Cardiovascular Disease | Admitting: Cardiovascular Disease

## 2023-06-04 DIAGNOSIS — E785 Hyperlipidemia, unspecified: Secondary | ICD-10-CM

## 2023-06-04 DIAGNOSIS — I493 Ventricular premature depolarization: Secondary | ICD-10-CM | POA: Diagnosis not present

## 2023-06-04 DIAGNOSIS — I480 Paroxysmal atrial fibrillation: Secondary | ICD-10-CM | POA: Diagnosis not present

## 2023-06-04 DIAGNOSIS — I10 Essential (primary) hypertension: Secondary | ICD-10-CM | POA: Diagnosis not present

## 2023-06-04 DIAGNOSIS — I4819 Other persistent atrial fibrillation: Secondary | ICD-10-CM | POA: Diagnosis not present

## 2023-06-04 DIAGNOSIS — G4733 Obstructive sleep apnea (adult) (pediatric): Secondary | ICD-10-CM | POA: Diagnosis not present

## 2023-06-04 DIAGNOSIS — Z7901 Long term (current) use of anticoagulants: Secondary | ICD-10-CM

## 2023-06-04 NOTE — Patient Instructions (Signed)
Medication Instructions:  NO CHANGES *If you need a refill on your cardiac medications before your next appointment, please call your pharmacy*   Lab Work: NONE If you have labs (blood work) drawn today and your tests are completely normal, you will receive your results only by: MyChart Message (if you have MyChart) OR A paper copy in the mail If you have any lab test that is abnormal or we need to change your treatment, we will call you to review the results.   Testing/Procedures: NONE  Follow-Up: At [facility], you and your health needs are our priority.  As part of our continuing mission to provide you with exceptional heart care, we have created designated Provider Care Teams.  These Care Teams include your primary Cardiologist (physician) and Advanced Practice Providers (APPs -  Physician Assistants and Nurse Practitioners) who all work together to provide you with the care you need, when you need it.  We recommend signing up for the patient portal called "MyChart".  Sign up information is provided on this After Visit Summary.  MyChart is used to connect with patients for Virtual Visits (Telemedicine).  Patients are able to view lab/test results, encounter notes, upcoming appointments, etc.  Non-urgent messages can be sent to your provider as well.   To learn more about what you can do with MyChart, go to [email or web address].    Your next appointment:   3 month(s) IF YOUR RECEIVE A NEW MACHINE. IF YOU DO NOT RECEIVE A NEW MACHINE, YOU WILL RETURN TO SEE DR. KELLY IN MAY 2025. A LETTER WILL BE SENT TO YOU AS A REMINDER TO CALL THE OFFICE FOR THAT MAY, 2025 APPOINTMENT.  Provider:   Nicki Gu[place], MD

## 2023-06-04 NOTE — Progress Notes (Signed)
Cardiology Office Note    Date:  06/10/2023   ID:  Peter [name], DOB 1946-07-04, MRN [id number]  PCP:  Darrow Bussing, MD  Cardiologist:  Nicki Guadalajara, MD (sleep); Dr. Anne Fu  30-month F/U sleep clinic evaluation  History of Present Illness:  Peter [name] is a 77 y.o. male who is followed by Dr. Donato Schultz for cardiology care and Dr. Elberta Fortis following his atrial fibrillation ablation.  Peter [name] has a history of CAD and is status post PCI to his LAD in 2006, and is status post atrial fibrillation ablation in December 2017.  He has a history of chronic systolic heart failure, remote GI bleed, GERD, iron deficiency anemia, and hyperlipidemia.    Due to his significant atrial fibrillation history, he was referred for a sleep study which was done as a split-night protocol on 10/09/2016.  This revealed severe sleep apnea with an AHI of 84.2 per hour.  He was unable to achieve rems sleep on the diagnostic portion of the study.  He at significant oxygen desaturation to 63% with time below 88% at 53.9 minutes.  CPAP was implemented and he was titrated up to 19 cm water pressure; however, due to continued events he was switched to BiPAP therapy.  He was titrated up to 27/23 and AHI was still increased at 6.7, with an RDI of 13.4, and oxygen desaturation to 87%.  Optimal titration was not achieved.  During that study, he was in atrial fibrillation.  He was referred for BiPAP set up and this was initiated on 12/09/2016.  I saw him for initial sleep evaluation in April 2018. A download of his ResMed AirCurve 10 VAuto unit  set at a maximum IPAP pressure of 25 and minimum EPAP pressure of 20 demonstrated compliance with 97% of days with usage and 93% of days with usage greater than 4 hours.  Average usage on days used was 7 hours and 33 minutes.  His AHI was excellent at 0.7.  He was having difficulty with a very high pressure.  Further review of his download indicates his 95th percentile EPAP  pressures 20.1 and IPAP pressure 24.1.  He had a newly released Dreamwear full face mask and was tolerating exceptionally the new design well and much better than the other full face mask that he had been using.  When I reviewed his BiPAP settings, he currently has a ramp time of 45 minutes with initiation of therapy at 14/10.  He had noticed significant improvement since initiating CPAP therapy.  His previous nocturia had significantly reduced.  He was unaware of breakthrough snoring.  He had leak with his other full face mask, but the new Dreamwear mask is significantly improved.  He denied any breakthrough snoring, daytime sleepiness, hypersomnolence, bruxism, restless legs, hypnogognic hallucinations, or cataplexy.  There are no parasomnias.  An Epworth Sleepiness Scale score was calculated in the office  as shown below:  Epworth Sleepiness Scale: Situation   Chance of Dozing/Sleeping (0 = never , 1 = slight chance , 2 = moderate chance , 3 = high chance )   sitting and reading 0   watching TV 0   sitting inactive in a public place 0   being a passenger in a motor vehicle for an hour or more 1   lying down in the afternoon 3   sitting and talking to someone 0   sitting quietly after lunch (no alcohol) 0   while stopped for a few minutes in traffic as  the driver 0   Total Score  4    Since his sleep evaluation, he states he had used BiPAP continuously until his mask broke and February 2020.  He had lost over 45 pounds since March 2019.  His waist size went from 42 down to 36.  Without a mass, he noticed that he was sleeping better that he had then he had had prior to initiating BiPAP therapy.  He called his DME company which is [employer] about obtaining a new mask and they had requested he needed to see me initially to do this.    I saw him in the office in July 2020.  At that time he admitted to mild snoring and denied any witnessed apneic spells.  He denied daytime sleepiness and a new  Epworth Sleepiness Scale score was calculated which endorsed at 2.    He was evaluated by me in a telemedicine visit on Mar 29, 2020 for evaluation he felt he was sleeping well.  He was feeling much better with BiPAP therapy with his new mask.  His 3 time nocturia had significantly improved to 1 or less per night.  He denied any breakthrough snoring.  A download was obtained from February 28, 2020 through Mar 28, 2020.  He was meeting compliance standards with average use at 6 hours and 51 minutes.  BiPAP settings were EPAP of 12 with maximum IPAP of 25 and 95th percentile pressure was 19.1/15.1 AHI was excellent at 2.6.  I last saw him on July 12, 2021.  He was continuing to use BiPAP  with excellent compliance.  However, over the past month he has begun to notice significant mask leak.  His mask is a ResMed AirFit F 30i medium mask.  He has significantly preferred this mass to his prior 1.  Typically he goes to bed around 1 PM and often wakes up for good at 7-7 30.  However over the past several months he has been taking his mask off when he returns from the bathroom around 4:56 AM and does not put it back on due to the significant pressure marks made on his face when he awakens.  Over the last several weeks he has noticed significant increase mask leak and red rather than green mask leak has come up on his morning monitor.  He feels significantly better using BiPAP therapy.  He previously had [employer] is his DME company which was brought out by [employer].  He has not yet contacted them about his mask leak.  He denies any chest pain or shortness of breath.  He is unaware of any recurrent atrial fibrillation.  He continues to be on lisinopril 40 mg, furosemide 40 mg, carvedilol 25 mg twice a day for blood pressure and rate control.  He is on Xarelto for anticoagulation and atorvastatin 40 mg for hyperlipidemia.  He is unaware of any breakthrough snoring and denies any residual daytime sleepiness.  Since I last  saw him, he has continued to be followed by Dr. Anne Fu and Dr. Elberta Fortis.  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  He has still been using a ResMed AirCurveair 10 VAuto unit with set up date December 09, 2016.  Recently, he is now noticing corrosion of his wires and tubes and is in need for a new device.  His unit is over 49-1/78 years old and he qualifies for a new device.  I obtained a download from June 24 through  June 03, 2023.  AHI is excellent with 100% use.  Average use is 8 hours and 18 minutes.  His BiPAP is set at a minimum EPAP pressure of 12 with maximum IPAP pressure of 25.  AHI is excellent at 1.9 with 95th percentile pressure at 20.9/16.9 with maximum average pressure 23.5/19.5.  An Epworth scale was calculated in the office today and this endorsed to argue against residual daytime sleepiness.  He presents for evaluation.   Past Medical History:  Diagnosis Date   Arthritis    At risk for sleep apnea    STOP-BANG= 5       SENT TO PCP 07-12-2015   CAD (coronary artery disease)    a. s/p PCI with DES x 3 to LAD in 2006 with bifurcation lesion and Plavix indefinitely was recommended.   Chronic combined systolic and diastolic CHF (congestive heart failure) (HCC)    a. Prior EF normal but EF dropped to 30-35% in 07/2016 in setting of AFIB.   CKD (chronic kidney disease), stage III (HCC)    Depression    Dyslipidemia    Dyspnea    tired quick d/t Afib   Dyspnea on exertion    Dysrhythmia    A.FIB   GERD (gastroesophageal reflux disease)    History of GI bleed    History of kidney stones    History of MI (myocardial infarction)    11-05-2005--  periprocedural MI  (cardiac cath) per dr Juanda Chance note   Hypertension    Hypertriglyceridemia    Iron deficiency anemia    Myocardial infarction [facility])    Persistent atrial fibrillation (HCC)    a. 07/2016, started on Eliquis -> readmitted later that month for AF RVR/acute HF - initially unsuccessful DCCV following  TEE, so loaded with amio with repeat successful DCCV 07/2816.   Right ureteral stone    S/P drug eluting coronary stent placement    x3  to LAD  2006   Sleep apnea    wears BIPAP, 14 up to 24    Past Surgical History:  Procedure Laterality Date   CARDIOVASCULAR STRESS TEST  01-06-2013  dr Myrtis Ser   normal perfusion study/  no ischemia or infarct/  normal LV function and wall motion , ef 57%   CARDIOVERSION N/A 08/05/2016   Procedure: CARDIOVERSION;  Surgeon: Thurmon Fair, MD;  Location: [facility] ENDOSCOPY;  Service: Cardiovascular;  Laterality: N/A;   CARDIOVERSION N/A 08/08/2016   Procedure: CARDIOVERSION;  Surgeon: Jake Bathe, MD;  Location: [facility] ENDOSCOPY;  Service: Cardiovascular;  Laterality: N/A;   CORONARY ANGIOPLASTY  11-13-2005 dr Juanda Chance   Successful touch up post-dilatation of the 3 tandem overlying Cypher stents in proximal to mid LAD (based on IVUS 0% stenosis and patent diagonal branch)/  Cutting Balloon Angioplasty of Ramus branch   CORONARY ANGIOPLASTY WITH STENT PLACEMENT  11-05-2005   dr Smitty Cords brodie   PCI and DES x3 to birfurcation lesion LAD and diagonal branch of LAD/   20% dLM,  30-40% RCA, ef 60%   CYSTOSCOPY WITH RETROGRADE PYELOGRAM, URETEROSCOPY AND STENT PLACEMENT Right 07/14/2015   Procedure: CYSTOSCOPY WITH RETROGRADE PYELOGRAM, URETEROSCOPY AND STENT PLACEMENT;  Surgeon: Sebastian Ache, MD;  Location: [facility];  Service: Urology;  Laterality: Right;   ELECTROPHYSIOLOGIC STUDY N/A 10/24/2016   Procedure: Atrial Fibrillation Ablation;  Surgeon: Will Jorja Loa, MD;  Location: [facility] INVASIVE CV LAB;  Service: Cardiovascular;  Laterality: N/A;   EXTRACORPOREAL SHOCK WAVE LITHOTRIPSY  yrs ago   HEMORRHOID SURGERY  N/A 10/24/2017   Procedure: HEMORRHOIDECTOMY;  Surgeon: Andria Meuse, MD;  Location: [facility] OR;  Service: General;  Laterality: N/A;   HERNIA REPAIR Bilateral 2000   Inguinal Hernia   IR GENERIC HISTORICAL  10/23/2016   IR FLUORO GUIDE CV  LINE RIGHT 10/23/2016 Berdine Dance, MD [facility]   IR GENERIC HISTORICAL  10/23/2016   IR US GUIDE VASC ACCESS RIGHT 10/23/2016 Berdine Dance, MD [facility]   LEFT URETEROSCOPIC STONE EXTRACTION  04/10/2006   STONE EXTRACTION WITH BASKET Right 07/14/2015   Procedure: STONE EXTRACTION WITH BASKET;  Surgeon: Sebastian Ache, MD;  Location: [facility];  Service: Urology;  Laterality: Right;   TEE WITHOUT CARDIOVERSION N/A 08/05/2016   Procedure: TRANSESOPHAGEAL ECHOCARDIOGRAM (TEE);  Surgeon: Thurmon Fair, MD;  Location[facility] ENDOSCOPY;  Service: Cardiovascular;  Laterality: N/A;   TOTAL HIP ARTHROPLASTY Right 07/16/2022   Procedure: RIGHT TOTAL HIP ARTHROPLASTY ANTERIOR APPROACH;  Surgeon: Tarry Kos, MD;  Location: [facility] OR;  Service: Orthopedics;  Laterality: Right;  3-C   TRANSTHORACIC ECHOCARDIOGRAM  02/04/2012   grade I diastolic dysfunction/  ef 55-60%/  trivial MR and TR/  mild LAE    Current Medications: Outpatient Medications Prior to Visit  Medication Sig Dispense Refill   allopurinol (ZYLOPRIM) 100 MG tablet Take 100 mg by mouth at bedtime. Pt takes 300 mg in morning and  100 mg at bedtime     atorvastatin (LIPITOR) 40 MG tablet Take 1 tablet (40 mg total) by mouth daily. 90 tablet 3   carvedilol (COREG) 25 MG tablet TAKE 1 TABLET TWICE DAILY WITH MEALS 180 tablet 3   diphenhydramine-acetaminophen (TYLENOL PM) 25-500 MG TABS tablet Take 1 tablet by mouth at bedtime.     ferrous sulfate 325 (65 FE) MG tablet Take 1 tablet (325 mg total) by mouth every morning. (Patient taking differently: Take 325 mg by mouth every other day.) 90 tablet 2   furosemide (LASIX) 40 MG tablet TAKE 1 TABLET EVERY DAY 90 tablet 3   lisinopril (ZESTRIL) 40 MG tablet TAKE 1 TABLET EVERY DAY 90 tablet 0   nitroGLYCERIN (NITROSTAT) 0.4 MG SL tablet Place 1 tablet (0.4 mg total) under the tongue every 5 (five) minutes as needed for chest pain. X 3 doses 25 tablet 5   pantoprazole (PROTONIX)  40 MG tablet TAKE 1 TABLET EVERY DAY (NEED MD APPOINTMENT) 90 tablet 3   polyvinyl alcohol (LIQUIFILM TEARS) 1.4 % ophthalmic solution Place 1 drop into the right eye daily.     rivaroxaban (XARELTO) 20 MG TABS tablet Take 1 tablet (20 mg total) by mouth daily. 90 tablet 3   sertraline (ZOLOFT) 25 MG tablet Take 25 mg by mouth daily.     spironolactone (ALDACTONE) 25 MG tablet Take 1 tablet (25 mg total) by mouth daily. 30 tablet 0   allopurinol (ZYLOPRIM) 300 MG tablet Take 300 mg by mouth in the morning. (Patient not taking: Reported on 06/04/2023)     Facility-Administered Medications Prior to Visit  Medication Dose Route Frequency Provider Last Rate Last Admin   triamcinolone acetonide (KENALOG) 10 MG/ML injection 10 mg  10 mg Other Once Regal, Norman S, DPM       triamcinolone acetonide (KENALOG) 10 MG/ML injection 10 mg  10 mg Other Once Lenn Sink, DPM         Allergies:   Patient has no known allergies.   Social History   Socioeconomic History   Marital status: Married    Spouse  name: Not on file   Number of children: Not on file   Years of education: Not on file   Highest education level: Not on file  Occupational History   Not on file  Tobacco Use   Smoking status: Never   Smokeless tobacco: Never  Vaping Use   Vaping status: Never Used  Substance and Sexual Activity   Alcohol use: No    Comment: 2 beers a year   Drug use: No   Sexual activity: Not on file  Other Topics Concern   Not on file  Social History Narrative   Not on file   Social Determinants of Health   Financial Resource Strain: Not on file  Food Insecurity: Not on file  Transportation Needs: Not on file  Physical Activity: Not on file  Stress: Not on file  Social Connections: Not on file     Family History:  The patient's family history includes Cancer in an other family member; Congestive Heart Failure (age of onset: 33) in his father; Heart attack (age of onset: 33) in his father.    ROS General: Negative; No fevers, chills, or night sweats;  HEENT: Negative; No changes in vision or hearing, sinus congestion, difficulty swallowing Pulmonary: Negative; No cough, wheezing, shortness of breath, hemoptysis Cardiovascular: History of atrial fibrillation, status post ablation, CAD, no recent chest pain GI: Positive for GERD GU: Negative; No dysuria, hematuria, or difficulty voiding Musculoskeletal: Negative; no myalgias, joint pain, or weakness Hematologic/Oncology: Negative; no easy bruising, bleeding Endocrine: Negative; no heat/cold intolerance; no diabetes Neuro: Negative; no changes in balance, headaches Skin: Negative; No rashes or skin lesions Psychiatric: Negative; No behavioral problems, depression Sleep: see HPI Epworth scale calculated today endorsed at 2, arguing against residual daytime sleepiness. Other comprehensive 14 point system review is negative.   PHYSICAL EXAM:   VS:  BP (!) 148/86 (BP Location: Right Arm, Patient Position: Sitting, Cuff Size: Normal)   Pulse 60   Ht 5\' 8"  (1.727 m)   Wt 216 lb 3.2 oz (98.1 kg)   SpO2 96%   BMI 32.87 kg/m     Repeat blood pressure by me was initial repeat by me was 135/82.  Subsequent repeat was 122/72.  Wt Readings from Last 3 Encounters:  06/06/23 213 lb (96.6 kg)  06/04/23 216 lb 3.2 oz (98.1 kg)  07/30/22 209 lb (94.8 kg)      Physical Exam BP (!) 148/86 (BP Location: Right Arm, Patient Position: Sitting, Cuff Size: Normal)   Pulse 60   Ht 5\' 8"  (1.727 m)   Wt 216 lb 3.2 oz (98.1 kg)   SpO2 96%   BMI 32.87 kg/m  General: Alert, oriented, no distress.  Skin: normal turgor, no rashes, warm and dry HEENT: Normocephalic, atraumatic. Pupils equal round and reactive to light; sclera anicteric; extraocular muscles intact;  Nose without nasal septal hypertrophy Mouth/Parynx benign; Mallinpatti scale 3/4 Neck: No JVD, no carotid bruits; normal carotid upstroke Lungs: clear to ausculatation and  percussion; no wheezing or rales Chest wall: without tenderness to palpitation Heart: PMI not displaced, RRR, s1 s2 normal, 1/6 systolic murmur, no diastolic murmur, no rubs, gallops, thrills, or heaves Abdomen: soft, nontender; no hepatosplenomehaly, BS+; abdominal aorta nontender and not dilated by palpation. Back: no CVA tenderness Pulses 2+ Musculoskeletal: full range of motion, normal strength, no joint deformities Extremities: no clubbing cyanosis or edema, Homan's sign negative  Neurologic: grossly nonfocal; Cranial nerves grossly wnl Psychologic: Normal mood and affect    Studies/Labs Reviewed:  EKG Interpretation Date/Time:  Wednesday June 04 2023 08:14:04 EDT Ventricular Rate:  60 PR Interval:  188 QRS Duration:  134 QT Interval:  446 QTC Calculation: 446 R Axis:   -22  Text Interpretation: Sinus rhythm with occasional Premature ventricular complexes Right bundle branch block Possible Lateral infarct , age undetermined When compared with ECG of 30-Jul-2022 01:58, PREVIOUS ECG IS PRESENT Confirmed by Nicki Guadalajara ([id number]) on 06/04/2023 8:42:02 AM    EKG:  EKG is ordered today.  ECG (independently read by me): Normal sinus rhythm at 69 bpm with several isolated PVCs.  QTc interval 439 ms.  July 2020 ECG (independently read by me): NSR at 63; Nondiagnostic T changes  April 2018 ECG (independently read by me): Sinus rhythm at 63 bpm.  Left affix deviation.  Small Q waves in 1 and aVL.  Nondiagnostic T-wave changes V2 and V3.  Recent Labs:    Latest Ref Rng & Units 07/30/2022    3:22 AM 07/09/2022   10:00 AM 03/03/2018    4:47 AM  BMP  Glucose 70 - 99 mg/dL 244  010  87   BUN 8 - 23 mg/dL 26  23  14    Creatinine 0.61 - 1.24 mg/dL 2.72  5.36  6.44   Sodium 135 - 145 mmol/L 139  139  144   Potassium 3.5 - 5.1 mmol/L 4.9  4.0  3.7   Chloride 98 - 111 mmol/L 107  108  111   CO2 22 - 32 mmol/L 24  25  23    Calcium 8.9 - 10.3 mg/dL 9.5  9.2  9.1         Latest Ref Rng &  Units 07/30/2022    3:22 AM 07/09/2022   10:00 AM 02/28/2018    1:20 PM  Hepatic Function  Total Protein 6.5 - 8.1 g/dL 6.4  6.6  7.7   Albumin 3.5 - 5.0 g/dL 3.5  3.8  3.9   AST 15 - 41 U/L 17  23  15    ALT 0 - 44 U/L 14  19  15    Alk Phosphatase 38 - 126 U/L 103  89  81   Total Bilirubin 0.3 - 1.2 mg/dL 0.9  0.6  1.2        Latest Ref Rng & Units 07/30/2022    3:22 AM 07/17/2022    9:56 AM 07/09/2022   10:00 AM  CBC  WBC 4.0 - 10.5 K/uL 18.7  16.7  9.5   Hemoglobin 13.0 - 17.0 g/dL 03.4  74.2  59.5   Hematocrit 39.0 - 52.0 % 36.8  39.0  40.7   Platelets 150 - 400 K/uL 171  123  137    Lab Results  Component Value Date   MCV 102.5 (H) 07/30/2022   MCV 99.0 07/17/2022   MCV 100.5 (H) 07/09/2022   Lab Results  Component Value Date   TSH 0.871 08/03/2016   No results found for: "HGBA1C"   BNP    Component Value Date/Time   BNP 414.8 (H) 08/03/2016 1103    ProBNP    Component Value Date/Time   PROBNP 144.7 (H) 02/03/2012 1854     Lipid Panel     Component Value Date/Time   CHOL 140 01/01/2013 1037   TRIG 230.0 (H) 01/01/2013 1037   HDL 38.20 (L) 01/01/2013 1037   CHOLHDL 4 01/01/2013 1037   VLDL 46.0 (H) 01/01/2013 1037   LDLDIRECT 68.6 01/01/2013 1037     RADIOLOGY: No results found.  Additional studies/ records that were reviewed today include:  I reviewed the patient's office records, atrial fibrillation clinic, split-night study, [employer] information, and download.    ASSESSMENT:    1. OSA (obstructive sleep apnea)   2. PVC's (premature ventricular contractions)   3. Paroxysmal atrial fibrillation (HCC)   4. Hyperlipidemia with target LDL less than 70   5. Anticoagulation adequate   6. Essential hypertension     PLAN:  Peter [name] is a 77 year old male who has a history CAD, status post PCI to his LAD, and atrial fibrillation for which he underwent successful ablation in December 2018.  Prior to his ablation procedure, he had  undergone a sleep study, which verified very severe sleep apnea with an AHI of 84.2 and markedly reduced oxygen desaturation to 63% with time spent below 88% saturation at 53.9 minutes.  When I saw him in April 2018 following institution of BiPAP therapy, he was feeling significantly improved and was meeting Medicare compliance standards.  He had excellent response to BiPAP with an AHI of 0.7.  Since March 2019, he has lost approximately 45 pounds in 6 inches from his waist with waist size being reduced from 42 down to 36.  Over the last several years, I have made adjustments to his BiPAP settings and most recently he is on a ramp time of 25 with start EPAP pressure at 10.  Minimum EPAP setting is 12 with maximum IPAP setting of 25.  Over the past year, he is continue to use BiPAP with 100% compliance.  He is averaging, however only 6 hours and 37 minutes of use per night despite being in bed for longer duration.  He has been taking his mask off when he returns from the bathroom at about 4:56 AM.  At a prior office visit I discussed with him that in the past he had very severe sleep apnea with significant oxygen desaturation to a nadir of 63% and on his diagnostics portion of his split-night evaluation he was unable to achieve any REM sleep.  We discussed that from 4 to 7 AM there is still high likelihood that he will obtain REM sleep and as result he is subjecting himself to potential nocturnal hypoxia if he is not continuing to use his mask.  His download is showing a 95th percentile pressure of 19.4/15.4 with maximum average pressure 21.7/17.7 cm of water.  Presently, CPAP use is excellent.  However he is not now noticing corrosion of some of his water tub.  He has been using the same BiPAP device since January 2018.  He qualifies for new machine.  I [employer] is his DME.  He is in need for a new mask which is over 97 years old.  In the office today I provided him with a new sample mask a ResMed F30i mask which he has  used and has done well.  I will refer him to [employer] to receive a new ResMed air curve 11 VAuto device and will set his pressures at his previous range with minimum EPAP 12 maximum IPAP 25 and pressure support of 4.  I will need to see him within 3 months following receiving a new machine.  Presently he is maintaining sinus rhythm and is unaware of recurrent atrial fibrillation.  His blood pressure on repeat by me was stable.  He continues to be on carvedilol 25 mg twice a day, lisinopril 40 mg daily, furosemide 40 mg.  He apparently is no longer taking spironolactone  which was discontinued by his primary physician Dr. Docia Chuck.  He continues to be on Xarelto for anticoagulation and atorvastatin 40 mg for hyperlipidemia.  I will see him in follow-up in several months following his receiving a new BiPAP device.    Medication Adjustments/Labs and Tests Ordered: Current medicines are reviewed at length with the patient today.  Concerns regarding medicines are outlined above.  Medication changes, Labs and Tests ordered today are listed in the Patient Instructions below.  Patient Instructions  Medication Instructions:  NO CHANGES *If you need a refill on your cardiac medications before your next appointment, please call your pharmacy*   Lab Work: NONE If you have labs (blood work) drawn today and your tests are completely normal, you will receive your results only by: MyChart Message (if you have MyChart) OR A paper copy in the mail If you have any lab test that is abnormal or we need to change your treatment, we will call you to review the results.   Testing/Procedures: NONE  Follow-Up: At [facility], you and your health needs are our priority.  As part of our continuing mission to provide you with exceptional heart care, we have created designated Provider Care Teams.  These Care Teams include your primary Cardiologist (physician) and Advanced Practice Providers (APPs -  Physician  Assistants and Nurse Practitioners) who all work together to provide you with the care you need, when you need it.  We recommend signing up for the patient portal called "MyChart".  Sign up information is provided on this After Visit Summary.  MyChart is used to connect with patients for Virtual Visits (Telemedicine).  Patients are able to view lab/test results, encounter notes, upcoming appointments, etc.  Non-urgent messages can be sent to your provider as well.   To learn more about what you can do with MyChart, go to [email or web address].    Your next appointment:   3 month(s) IF YOUR RECEIVE A NEW MACHINE. IF YOU DO NOT RECEIVE A NEW MACHINE, YOU WILL RETURN TO SEE DR. Linet Brash IN MAY 2025. A LETTER WILL BE SENT TO YOU AS A REMINDER TO CALL THE OFFICE FOR THAT MAY, 2025 APPOINTMENT.  Provider:   Nicki Guadalajara, MD        Signed, Nicki Guadalajara, MD, [facility], ABSM Diplomate, AAmerican Board of Sleep Medicine  06/10/2023 12:02 PM    [facility] [address], [place], [place]  [address] Phone: [phone]

## 2023-06-06 ENCOUNTER — Encounter: Payer: Self-pay | Admitting: Cardiology

## 2023-06-06 ENCOUNTER — Ambulatory Visit: Payer: Medicare HMO | Attending: Cardiology | Admitting: Cardiology

## 2023-06-06 VITALS — BP 136/82 | HR 76 | Ht 68.0 in | Wt 213.0 lb

## 2023-06-06 DIAGNOSIS — I493 Ventricular premature depolarization: Secondary | ICD-10-CM | POA: Diagnosis not present

## 2023-06-06 DIAGNOSIS — I4819 Other persistent atrial fibrillation: Secondary | ICD-10-CM | POA: Diagnosis not present

## 2023-06-06 DIAGNOSIS — I2510 Atherosclerotic heart disease of native coronary artery without angina pectoris: Secondary | ICD-10-CM | POA: Diagnosis not present

## 2023-06-06 DIAGNOSIS — D6869 Other thrombophilia: Secondary | ICD-10-CM | POA: Diagnosis not present

## 2023-06-06 DIAGNOSIS — I10 Essential (primary) hypertension: Secondary | ICD-10-CM

## 2023-06-06 DIAGNOSIS — G4733 Obstructive sleep apnea (adult) (pediatric): Secondary | ICD-10-CM

## 2023-06-06 NOTE — Patient Instructions (Signed)

## 2023-06-06 NOTE — Progress Notes (Signed)
  Electrophysiology Office Note:   Date:  06/06/2023  ID:  [name], DOB 1946-06-04, MRN [id number]  Primary Cardiologist: Nicki Guadalajara, MD Electrophysiologist: Regan Lemming, MD      History of Present Illness:   Peter [name] is a 77 y.o. male with h/o atrial fibrillation seen today for routine electrophysiology followup.  Since last being seen in our clinic the patient reports doing well.  He has noted no further episodes of atrial fibrillation.  He had his hip replaced last September and has done well.  He is able to ambulate and exert himself more freely.  He was having significant pain prior to hip replacement but now is pain-free.  he denies chest pain, palpitations, dyspnea, PND, orthopnea, nausea, vomiting, dizziness, syncope, edema, weight gain, or early satiety.     He has a history significant for coronary disease post PCI x 3 to the LAD in 2006, chronic diastolic heart failure, hyperlipidemia, GERD, GI bleed, hypertension, obesity, CKD stage III, atrial fibrillation.  He is post atrial fibrillation ablation 10/24/2016.     Review of systems complete and found to be negative unless listed in HPI.   EP Information / Studies Reviewed:    EKG is not ordered today. EKG from 06/04/23 reviewed which showed sinus rhythm, RBBB, PVC        Risk Assessment/Calculations:    CHA2DS2-VASc Score = 4   This indicates a 4.8% annual risk of stroke. The patient's score is based upon: CHF History: 0 HTN History: 1 Diabetes History: 0 Stroke History: 0 Vascular Disease History: 1 Age Score: 2 Gender Score: 0             Physical Exam:   VS:  BP 136/82 (BP Location: Left Arm, Patient Position: Sitting, Cuff Size: Large)   Pulse 76   Ht 5\' 8"  (1.727 m)   Wt 213 lb (96.6 kg)   SpO2 96%   BMI 32.39 kg/m    Wt Readings from Last 3 Encounters:  06/06/23 213 lb (96.6 kg)  06/04/23 216 lb 3.2 oz (98.1 kg)  07/30/22 209 lb (94.8 kg)     GEN: Well nourished, well  developed in no acute distress NECK: No JVD; No carotid bruits CARDIAC: Regular rate and rhythm, no murmurs, rubs, gallops RESPIRATORY:  Clear to auscultation without rales, wheezing or rhonchi  ABDOMEN: Soft, non-tender, non-distended EXTREMITIES:  No edema; No deformity   ASSESSMENT AND PLAN:    1.  Persistent atrial fibrillation/flutter: Status post ablation 10/24/2016.  Currently on Xarelto and carvedilol.  Has remained in sinus rhythm without episodes of atrial fibrillation.  No changes.  2.  Likely nonischemic cardiomyopathy: Ejection fraction is improved with maintenance of sinus rhythm  3.  Hypertension: Currently well-controlled  4.  Coronary artery disease: PCI to the LAD in 2006.  Asymptomatic.  5.  Obstructive sleep apnea: BiPAP compliance encouraged  Follow up with Dr. Elberta Fortis in 12 months  Signed, Bernadean Saling Jorja Loa, MD

## 2023-06-08 ENCOUNTER — Other Ambulatory Visit: Payer: Self-pay | Admitting: Cardiology

## 2023-06-10 ENCOUNTER — Encounter: Payer: Self-pay | Admitting: Cardiovascular Disease

## 2023-07-15 ENCOUNTER — Ambulatory Visit: Payer: Medicare HMO | Admitting: Orthopaedic Surgery

## 2023-07-16 ENCOUNTER — Other Ambulatory Visit (INDEPENDENT_AMBULATORY_CARE_PROVIDER_SITE_OTHER): Payer: Medicare HMO

## 2023-07-16 ENCOUNTER — Ambulatory Visit: Payer: Medicare HMO | Admitting: Orthopaedic Surgery

## 2023-07-16 ENCOUNTER — Encounter: Payer: Self-pay | Admitting: Orthopaedic Surgery

## 2023-07-16 VITALS — BP 122/77

## 2023-07-16 DIAGNOSIS — Z96641 Presence of right artificial hip joint: Secondary | ICD-10-CM

## 2023-07-16 NOTE — Progress Notes (Signed)
Post-Op Visit Note   Patient: Peter [name]           Date of Birth: 1946/09/26           MRN: [id number] Visit Date: 07/16/2023 PCP: Darrow Bussing, MD   Assessment & Plan:  Chief Complaint:  Chief Complaint  Patient presents with   Right Hip - Follow-up    Right total hip arthroplasty 07/16/2022   Visit Diagnoses:  1. Status post total replacement of right hip     Plan: Patient is a very pleasant 77 year old gentleman who comes in today 1 year status post right total hip replacement on 07/16/2022.  He has been doing great.  No complaints.  Examination of the right hip reveals painless hip flexion and logroll.  His right leg is a little longer than the left.  This is not causing pain.  At this point, patient is doing well.  Continue to advance with activity as tolerated.  Dental prophylaxis reinforced for another year.  Follow-up next year for repeat evaluation and AP pelvis x-rays.  Call with concerns or questions.  Follow-Up Instructions: Return in about 1 year (around 07/15/2024).   Orders:  Orders Placed This Encounter  Procedures   XR Pelvis 1-2 Views   No orders of the defined types were placed in this encounter.   Imaging: XR Pelvis 1-2 Views  Result Date: 07/16/2023 X-rays demonstrate a well-seated prosthesis without complication   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 thumb 04/16/2022   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   Chronic right shoulder pain 03/07/2022   Pain of right hand 03/07/2022   Diverticulitis 02/28/2018   RLQ abdominal pain    Sigmoid diverticulitis    Acute blood loss anemia 11/13/2017   Anticoagulation adequate 11/12/2016   Epistaxis, recurrent 11/12/2016   AF (atrial fibrillation) (HCC) 10/24/2016   Obesity 08/09/2016   Atrial  fibrillation with RVR (HCC) 08/03/2016   New onset a-fib (HCC) 07/16/2016   Persistent atrial fibrillation (HCC) 07/16/2016   Acute systolic (congestive) heart failure (HCC)    Ejection fraction    Iron deficiency anemia due to chronic blood loss 02/04/2012   Anemia 02/03/2012   CKD (chronic kidney disease), stage III (HCC) 02/03/2012   DOE (dyspnea on exertion) 01/28/2012   Hypertension    Coronary artery disease due to lipid rich plaque    Dyslipidemia    GERD (gastroesophageal reflux disease)    Past Medical History:  Diagnosis Date   Arthritis    At risk for sleep apnea    STOP-BANG= 5       SENT TO PCP 07-12-2015   CAD (coronary artery disease)    a. s/p PCI with DES x 3 to LAD in 2006 with bifurcation lesion and Plavix indefinitely was recommended.   Chronic combined systolic and diastolic CHF (congestive heart failure) (HCC)    a. Prior EF normal but EF dropped to 30-35% in 07/2016 in setting of AFIB.   CKD (chronic kidney disease), stage III (HCC)    Depression    Dyslipidemia    Dyspnea    tired quick d/t Afib   Dyspnea on exertion    Dysrhythmia    A.FIB   GERD (gastroesophageal reflux disease)    History of GI bleed    History of  kidney stones    History of MI (myocardial infarction)    11-05-2005--  periprocedural MI  (cardiac cath) per dr Juanda Chance note   Hypertension    Hypertriglyceridemia    Iron deficiency anemia    Myocardial infarction [facility])    Persistent atrial fibrillation (HCC)    a. 07/2016, started on Eliquis -> readmitted later that month for AF RVR/acute HF - initially unsuccessful DCCV following TEE, so loaded with amio with repeat successful DCCV 07/2816.   Right ureteral stone    S/P drug eluting coronary stent placement    x3  to LAD  2006   Sleep apnea    wears BIPAP, 13 up to 24    Family History  Problem Relation Age of Onset   Heart attack Father 56   Congestive Heart Failure Father 29   Cancer Other     Past Surgical History:   Procedure Laterality Date   CARDIOVASCULAR STRESS TEST  01-06-2013  dr Myrtis Ser   normal perfusion study/  no ischemia or infarct/  normal LV function and wall motion , ef 57%   CARDIOVERSION N/A 08/05/2016   Procedure: CARDIOVERSION;  Surgeon: Thurmon Fair, MD;  Location: [facility];  Service: Cardiovascular;  Laterality: N/A;   CARDIOVERSION N/A 08/08/2016   Procedure: CARDIOVERSION;  Surgeon: Jake Bathe, MD;  Location: [facility];  Service: Cardiovascular;  Laterality: N/A;   CORONARY ANGIOPLASTY  11-13-2005 dr Juanda Chance   Successful touch up post-dilatation of the 3 tandem overlying Cypher stents in proximal to mid LAD (based on IVUS 0% stenosis and patent diagonal branch)/  Cutting Balloon Angioplasty of Ramus branch   CORONARY ANGIOPLASTY WITH STENT PLACEMENT  11-05-2005   dr Smitty Cords brodie   PCI and DES x3 to birfurcation lesion LAD and diagonal branch of LAD/   20% dLM,  30-40% RCA, ef 60%   CYSTOSCOPY WITH RETROGRADE PYELOGRAM, URETEROSCOPY AND STENT PLACEMENT Right 07/14/2015   Procedure: CYSTOSCOPY WITH RETROGRADE PYELOGRAM, URETEROSCOPY AND STENT PLACEMENT;  Surgeon: Sebastian Ache, MD;  Location: [facility];  Service: Urology;  Laterality: Right;   ELECTROPHYSIOLOGIC STUDY N/A 10/24/2016   Procedure: Atrial Fibrillation Ablation;  Surgeon: Will Jorja Loa, MD;  Location: [facility];  Service: Cardiovascular;  Laterality: N/A;   EXTRACORPOREAL SHOCK WAVE LITHOTRIPSY  yrs ago   HEMORRHOID SURGERY N/A 10/24/2017   Procedure: HEMORRHOIDECTOMY;  Surgeon: Andria Meuse, MD;  Location: [facility] OR;  Service: General;  Laterality: N/A;   HERNIA REPAIR Bilateral 2000   Inguinal Hernia   IR GENERIC HISTORICAL  10/23/2016   IR FLUORO GUIDE CV LINE RIGHT 10/23/2016 Berdine Dance, MD [facility]   IR GENERIC HISTORICAL  10/23/2016   IR US GUIDE VASC ACCESS RIGHT 10/23/2016 Berdine Dance, MD [facility]   LEFT URETEROSCOPIC STONE EXTRACTION  04/10/2006    STONE EXTRACTION WITH BASKET Right 07/14/2015   Procedure: STONE EXTRACTION WITH BASKET;  Surgeon: Sebastian Ache, MD;  Location: [facility];  Service: Urology;  Laterality: Right;   TEE WITHOUT CARDIOVERSION N/A 08/05/2016   Procedure: TRANSESOPHAGEAL ECHOCARDIOGRAM (TEE);  Surgeon: Thurmon Fair, MD;  Location: [facility] ENDOSCOPY;  Service: Cardiovascular;  Laterality: N/A;   TOTAL HIP ARTHROPLASTY Right 07/16/2022   Procedure: RIGHT TOTAL HIP ARTHROPLASTY ANTERIOR APPROACH;  Surgeon: Tarry Kos, MD;  Location: [facility];  Service: Orthopedics;  Laterality: Right;  3-C   TRANSTHORACIC ECHOCARDIOGRAM  02/04/2012   grade I diastolic dysfunction/  ef 55-60%/  trivial MR and TR/  mild LAE  Social History   Occupational History   Not on file  Tobacco Use   Smoking status: Never   Smokeless tobacco: Never  Vaping Use   Vaping status: Never Used  Substance and Sexual Activity   Alcohol use: No    Comment: 2 beers a year   Drug use: No   Sexual activity: Not on file

## 2023-07-17 ENCOUNTER — Telehealth: Payer: Self-pay | Admitting: Cardiovascular Disease

## 2023-07-17 NOTE — Telephone Encounter (Signed)
Patient stated his sleep machine has been pausing 4-5 times during the night and he has to restart the machine each time.  Patient wants to get a new machine and next steps.

## 2023-07-23 DIAGNOSIS — I10 Essential (primary) hypertension: Secondary | ICD-10-CM | POA: Diagnosis not present

## 2023-07-23 DIAGNOSIS — N1831 Chronic kidney disease, stage 3a: Secondary | ICD-10-CM | POA: Diagnosis not present

## 2023-09-25 DIAGNOSIS — Z85828 Personal history of other malignant neoplasm of skin: Secondary | ICD-10-CM | POA: Diagnosis not present

## 2023-09-25 DIAGNOSIS — L821 Other seborrheic keratosis: Secondary | ICD-10-CM | POA: Diagnosis not present

## 2023-09-25 DIAGNOSIS — L570 Actinic keratosis: Secondary | ICD-10-CM | POA: Diagnosis not present

## 2023-09-25 DIAGNOSIS — L814 Other melanin hyperpigmentation: Secondary | ICD-10-CM | POA: Diagnosis not present

## 2023-10-01 DIAGNOSIS — N1831 Chronic kidney disease, stage 3a: Secondary | ICD-10-CM | POA: Diagnosis not present

## 2023-10-01 DIAGNOSIS — I10 Essential (primary) hypertension: Secondary | ICD-10-CM | POA: Diagnosis not present

## 2023-10-01 DIAGNOSIS — Z79899 Other long term (current) drug therapy: Secondary | ICD-10-CM | POA: Diagnosis not present

## 2023-10-22 ENCOUNTER — Other Ambulatory Visit: Payer: Self-pay | Admitting: Cardiology

## 2023-11-19 ENCOUNTER — Other Ambulatory Visit: Payer: Self-pay | Admitting: Cardiovascular Disease

## 2023-11-19 ENCOUNTER — Other Ambulatory Visit: Payer: Self-pay | Admitting: Cardiology

## 2023-11-19 DIAGNOSIS — I480 Paroxysmal atrial fibrillation: Secondary | ICD-10-CM

## 2023-11-19 NOTE — Telephone Encounter (Signed)
 Xarelto  20mg  refill request received. Pt is 78 years old, weight-96.6kg, Crea-1.24 on 10/06/23 via Care Everywhere from PCP, last seen by Dr. Inocencio on 06/06/23, Diagnosis-Afib, CrCl- 67.08 mL/min; Dose is appropriate based on dosing criteria. Will send in refill to requested pharmacy.

## 2023-11-19 NOTE — Telephone Encounter (Signed)
*  STAT* If patient is at the pharmacy, call can be transferred to refill team.   1. Which medications need to be refilled? (please list name of each medication and dose if known) carvedilol  (COREG ) 25 MG tablet , furosemide  (LASIX ) 40 MG tablet , lisinopril  (ZESTRIL ) 40 MG tablet , pantoprazole  (PROTONIX ) 40 MG tablet , rivaroxaban  (XARELTO ) 20 MG TABS tablet    2. Would you like to learn more about the convenience, safety, & potential cost savings by using the [facility]? NO   3. Are you open to using the [facility] NO   4. Which pharmacy/location (including street and city if local pharmacy) is medication to be sent to?  [facility] - [place], [place] - [address]     5. Do they need a 30 day or 90 day supply? 90  Patient states that Dr. Inocencio office normally sends in his refills for the year to [facility] at the beginning of every year.

## 2023-11-26 DIAGNOSIS — I10 Essential (primary) hypertension: Secondary | ICD-10-CM | POA: Diagnosis not present

## 2023-11-26 DIAGNOSIS — Z79899 Other long term (current) drug therapy: Secondary | ICD-10-CM | POA: Diagnosis not present

## 2023-11-26 DIAGNOSIS — I2510 Atherosclerotic heart disease of native coronary artery without angina pectoris: Secondary | ICD-10-CM | POA: Diagnosis not present

## 2023-11-26 DIAGNOSIS — R7301 Impaired fasting glucose: Secondary | ICD-10-CM | POA: Diagnosis not present

## 2023-11-26 DIAGNOSIS — N1831 Chronic kidney disease, stage 3a: Secondary | ICD-10-CM | POA: Diagnosis not present

## 2023-11-26 DIAGNOSIS — I428 Other cardiomyopathies: Secondary | ICD-10-CM | POA: Diagnosis not present

## 2023-11-26 NOTE — Telephone Encounter (Signed)
 Pt calling in to f/u on this request. He would like a new machine and mask, he states his is outdated and now working properly please advise. He states he still uses [facility]

## 2023-12-01 ENCOUNTER — Telehealth: Payer: Self-pay | Admitting: Cardiovascular Disease

## 2023-12-01 DIAGNOSIS — I480 Paroxysmal atrial fibrillation: Secondary | ICD-10-CM

## 2023-12-01 MED ORDER — RIVAROXABAN 20 MG PO TABS
20.0000 mg | ORAL_TABLET | Freq: Every day | ORAL | 1 refills | Status: DC
Start: 2023-12-01 — End: 2024-02-09

## 2023-12-01 NOTE — Telephone Encounter (Signed)
*  STAT* If patient is at the pharmacy, call can be transferred to refill team.   1. Which medications need to be refilled? (please list name of each medication and dose if known)   rivaroxaban (XARELTO) 20 MG TABS tablet   2. Would you like to learn more about the convenience, safety, & potential cost savings by using the [facility]?   3. Are you open to using the [facility] (Type [facility]. ).  4. Which pharmacy/location (including street and city if local pharmacy) is medication to be sent to?  [id number] - [place], [place] - [address]   5. Do they need a 30 day or 90 day supply?   30 day  Patient stated he has been out of this medication for 3 days now and wants a prescription sent to [facility] [place] until his insurance gets his approval for [facility].   Patient wants call to confirm prescription sent to [facility].

## 2023-12-01 NOTE — Telephone Encounter (Signed)
Xarelto 20mg  refill request received. Pt is 78 years old, weight-96.6kg, Crea-1.24 on 10/06/23 via care Everywhere from PCP, last seen by Dr. Elberta Fortis on 06/06/23, Diagnosis-Afib, CrCl-67.86ml/min; Dose is appropriate based on dosing criteria. Will send in refill to requested pharmacy.     Last sent to mail order and now requesting local per request.

## 2023-12-03 ENCOUNTER — Telehealth: Payer: Self-pay | Admitting: Cardiovascular Disease

## 2023-12-03 DIAGNOSIS — I4819 Other persistent atrial fibrillation: Secondary | ICD-10-CM

## 2023-12-03 DIAGNOSIS — G4733 Obstructive sleep apnea (adult) (pediatric): Secondary | ICD-10-CM

## 2023-12-03 DIAGNOSIS — I480 Paroxysmal atrial fibrillation: Secondary | ICD-10-CM

## 2023-12-03 NOTE — Telephone Encounter (Signed)
Notified patient that order for [employer] and all supplies needed faxed to [facility] today 12/03/23

## 2023-12-03 NOTE — Telephone Encounter (Signed)
Patient stated he needs to get a new BiPAP machine.  Patient stated his machine turns off during the night while he is sleeping.  Patient stated his machine is also using a lot of water.  Patient noted he also needs a new mask.

## 2023-12-16 ENCOUNTER — Telehealth: Payer: Self-pay | Admitting: Cardiovascular Disease

## 2023-12-16 NOTE — Telephone Encounter (Signed)
 Spoke with patient of Dr. Burnard (per operator). Patient said he sees Dr.Camnitz (remote hx with Dr. Jeffrie) Symptoms of shortness of breath started Saturday afternoon. This is accompanied by weakness. He does not feel this is afib related.  BP was 196/116 Sunday afternoon -- took extra lisinopril  40mg  -- did not take lasix  this day as he had granddaughters in for a visit  BP yesterday 175/95 yesterday -- felt a little better, went to part-time job at the [employer] but then was wiped out and had to go home. He reports walking to the bathroom in his workplace wore him out   BP this morning 145/88 - HR 70 -- tried to go to work again today, but wiped out and needed someone to drive him home  Symptoms: shortness of breath, weakness, elevated BP Denies bleeding issues, denies chest pain, denies palpitations   Scheduled for first available appointment on 12/19/23 with LOIS Satterfield DNP Patient asked to have Sherri RN call him but she is out today   Will route to Dr. Inocencio and Maeola RN for review of the message and f/u and/or advice prior to upcoming visit.

## 2023-12-16 NOTE — Telephone Encounter (Signed)
 Pt c/o Shortness Of Breath: STAT if SOB developed within the last 24 hours or pt is noticeably SOB on the phone  1. Are you currently SOB (can you hear that pt is SOB on the phone)? Yes   2. How long have you been experiencing SOB? Saturday Afternoon  3. Are you SOB when sitting or when up moving around? Both   4. Are you currently experiencing any other symptoms? 196/116; 99; 145/84; 70

## 2023-12-17 NOTE — Progress Notes (Signed)
 NO charge. Patient not seen

## 2023-12-19 ENCOUNTER — Other Ambulatory Visit: Payer: Self-pay

## 2023-12-19 ENCOUNTER — Inpatient Hospital Stay (HOSPITAL_COMMUNITY)
Admission: EM | Admit: 2023-12-19 | Discharge: 2023-12-25 | DRG: 871 | Disposition: A | Discharge status: Home / Self Care | Payer: Medicare HMO | Attending: Internal Medicine | Admitting: Internal Medicine

## 2023-12-19 ENCOUNTER — Ambulatory Visit: Payer: Medicare HMO | Attending: Adult Health | Admitting: Adult Health

## 2023-12-19 ENCOUNTER — Emergency Department (HOSPITAL_COMMUNITY): Payer: Medicare HMO

## 2023-12-19 ENCOUNTER — Encounter: Payer: Self-pay | Admitting: Adult Health

## 2023-12-19 VITALS — BP 116/62 | HR 85 | Ht 68.0 in | Wt 217.0 lb

## 2023-12-19 DIAGNOSIS — J1000 Influenza due to other identified influenza virus with unspecified type of pneumonia: Secondary | ICD-10-CM | POA: Diagnosis present

## 2023-12-19 DIAGNOSIS — I509 Heart failure, unspecified: Secondary | ICD-10-CM | POA: Diagnosis not present

## 2023-12-19 DIAGNOSIS — I4819 Other persistent atrial fibrillation: Secondary | ICD-10-CM | POA: Diagnosis present

## 2023-12-19 DIAGNOSIS — Z955 Presence of coronary angioplasty implant and graft: Secondary | ICD-10-CM

## 2023-12-19 DIAGNOSIS — I2510 Atherosclerotic heart disease of native coronary artery without angina pectoris: Secondary | ICD-10-CM

## 2023-12-19 DIAGNOSIS — A419 Sepsis, unspecified organism: Secondary | ICD-10-CM | POA: Diagnosis present

## 2023-12-19 DIAGNOSIS — Z8249 Family history of ischemic heart disease and other diseases of the circulatory system: Secondary | ICD-10-CM | POA: Diagnosis not present

## 2023-12-19 DIAGNOSIS — R5383 Other fatigue: Secondary | ICD-10-CM | POA: Diagnosis not present

## 2023-12-19 DIAGNOSIS — Z1152 Encounter for screening for COVID-19: Secondary | ICD-10-CM | POA: Diagnosis not present

## 2023-12-19 DIAGNOSIS — R059 Cough, unspecified: Secondary | ICD-10-CM | POA: Diagnosis not present

## 2023-12-19 DIAGNOSIS — R918 Other nonspecific abnormal finding of lung field: Secondary | ICD-10-CM | POA: Diagnosis not present

## 2023-12-19 DIAGNOSIS — J479 Bronchiectasis, uncomplicated: Secondary | ICD-10-CM | POA: Diagnosis not present

## 2023-12-19 DIAGNOSIS — D631 Anemia in chronic kidney disease: Secondary | ICD-10-CM | POA: Diagnosis present

## 2023-12-19 DIAGNOSIS — A4189 Other specified sepsis: Principal | ICD-10-CM | POA: Diagnosis present

## 2023-12-19 DIAGNOSIS — J101 Influenza due to other identified influenza virus with other respiratory manifestations: Secondary | ICD-10-CM | POA: Diagnosis not present

## 2023-12-19 DIAGNOSIS — G4730 Sleep apnea, unspecified: Secondary | ICD-10-CM | POA: Diagnosis present

## 2023-12-19 DIAGNOSIS — Z79899 Other long term (current) drug therapy: Secondary | ICD-10-CM | POA: Diagnosis not present

## 2023-12-19 DIAGNOSIS — R0609 Other forms of dyspnea: Secondary | ICD-10-CM | POA: Diagnosis not present

## 2023-12-19 DIAGNOSIS — E781 Pure hyperglyceridemia: Secondary | ICD-10-CM | POA: Diagnosis present

## 2023-12-19 DIAGNOSIS — Z7901 Long term (current) use of anticoagulants: Secondary | ICD-10-CM

## 2023-12-19 DIAGNOSIS — F32A Depression, unspecified: Secondary | ICD-10-CM | POA: Diagnosis present

## 2023-12-19 DIAGNOSIS — R0602 Shortness of breath: Secondary | ICD-10-CM | POA: Diagnosis not present

## 2023-12-19 DIAGNOSIS — Z888 Allergy status to other drugs, medicaments and biological substances status: Secondary | ICD-10-CM

## 2023-12-19 DIAGNOSIS — I2583 Coronary atherosclerosis due to lipid rich plaque: Secondary | ICD-10-CM

## 2023-12-19 DIAGNOSIS — Z96641 Presence of right artificial hip joint: Secondary | ICD-10-CM | POA: Diagnosis present

## 2023-12-19 DIAGNOSIS — I4510 Unspecified right bundle-branch block: Secondary | ICD-10-CM | POA: Diagnosis present

## 2023-12-19 DIAGNOSIS — I10 Essential (primary) hypertension: Secondary | ICD-10-CM | POA: Diagnosis present

## 2023-12-19 DIAGNOSIS — J9601 Acute respiratory failure with hypoxia: Secondary | ICD-10-CM | POA: Diagnosis present

## 2023-12-19 DIAGNOSIS — N1831 Chronic kidney disease, stage 3a: Secondary | ICD-10-CM | POA: Diagnosis present

## 2023-12-19 DIAGNOSIS — R9389 Abnormal findings on diagnostic imaging of other specified body structures: Secondary | ICD-10-CM

## 2023-12-19 DIAGNOSIS — R590 Localized enlarged lymph nodes: Secondary | ICD-10-CM | POA: Diagnosis not present

## 2023-12-19 DIAGNOSIS — J849 Interstitial pulmonary disease, unspecified: Secondary | ICD-10-CM | POA: Diagnosis not present

## 2023-12-19 DIAGNOSIS — I493 Ventricular premature depolarization: Secondary | ICD-10-CM

## 2023-12-19 DIAGNOSIS — Z23 Encounter for immunization: Secondary | ICD-10-CM

## 2023-12-19 DIAGNOSIS — K219 Gastro-esophageal reflux disease without esophagitis: Secondary | ICD-10-CM | POA: Diagnosis present

## 2023-12-19 DIAGNOSIS — N183 Chronic kidney disease, stage 3 unspecified: Secondary | ICD-10-CM | POA: Diagnosis present

## 2023-12-19 DIAGNOSIS — J189 Pneumonia, unspecified organism: Secondary | ICD-10-CM | POA: Diagnosis not present

## 2023-12-19 DIAGNOSIS — N179 Acute kidney failure, unspecified: Secondary | ICD-10-CM | POA: Diagnosis present

## 2023-12-19 DIAGNOSIS — I252 Old myocardial infarction: Secondary | ICD-10-CM

## 2023-12-19 DIAGNOSIS — I480 Paroxysmal atrial fibrillation: Secondary | ICD-10-CM | POA: Diagnosis present

## 2023-12-19 DIAGNOSIS — J984 Other disorders of lung: Secondary | ICD-10-CM | POA: Diagnosis not present

## 2023-12-19 DIAGNOSIS — J470 Bronchiectasis with acute lower respiratory infection: Secondary | ICD-10-CM | POA: Diagnosis not present

## 2023-12-19 DIAGNOSIS — J90 Pleural effusion, not elsewhere classified: Secondary | ICD-10-CM | POA: Diagnosis not present

## 2023-12-19 DIAGNOSIS — D649 Anemia, unspecified: Secondary | ICD-10-CM | POA: Diagnosis present

## 2023-12-19 DIAGNOSIS — I5022 Chronic systolic (congestive) heart failure: Secondary | ICD-10-CM | POA: Diagnosis not present

## 2023-12-19 DIAGNOSIS — J209 Acute bronchitis, unspecified: Secondary | ICD-10-CM | POA: Diagnosis present

## 2023-12-19 DIAGNOSIS — I130 Hypertensive heart and chronic kidney disease with heart failure and stage 1 through stage 4 chronic kidney disease, or unspecified chronic kidney disease: Secondary | ICD-10-CM | POA: Diagnosis present

## 2023-12-19 DIAGNOSIS — I4891 Unspecified atrial fibrillation: Secondary | ICD-10-CM | POA: Diagnosis present

## 2023-12-19 DIAGNOSIS — R051 Acute cough: Secondary | ICD-10-CM | POA: Diagnosis not present

## 2023-12-19 LAB — CBC WITH DIFFERENTIAL/PLATELET
Abs Immature Granulocytes: 0.03 10*3/uL (ref 0.00–0.07)
Basophils Absolute: 0 10*3/uL (ref 0.0–0.1)
Basophils Relative: 0 %
Eosinophils Absolute: 0.1 10*3/uL (ref 0.0–0.5)
Eosinophils Relative: 1 %
HCT: 38.7 % — ABNORMAL LOW (ref 39.0–52.0)
Hemoglobin: 12.6 g/dL — ABNORMAL LOW (ref 13.0–17.0)
Immature Granulocytes: 0 %
Lymphocytes Relative: 3 %
Lymphs Abs: 0.2 10*3/uL — ABNORMAL LOW (ref 0.7–4.0)
MCH: 33.8 pg (ref 26.0–34.0)
MCHC: 32.6 g/dL (ref 30.0–36.0)
MCV: 103.8 fL — ABNORMAL HIGH (ref 80.0–100.0)
Monocytes Absolute: 0.8 10*3/uL (ref 0.1–1.0)
Monocytes Relative: 10 %
Neutro Abs: 6.8 10*3/uL (ref 1.7–7.7)
Neutrophils Relative %: 86 %
Platelets: 144 10*3/uL — ABNORMAL LOW (ref 150–400)
RBC: 3.73 MIL/uL — ABNORMAL LOW (ref 4.22–5.81)
RDW: 13.9 % (ref 11.5–15.5)
WBC: 7.9 10*3/uL (ref 4.0–10.5)
nRBC: 0 % (ref 0.0–0.2)

## 2023-12-19 LAB — RESP PANEL BY RT-PCR (RSV, FLU A&B, COVID)  RVPGX2
Influenza A by PCR: POSITIVE — AB
Influenza B by PCR: NEGATIVE
Resp Syncytial Virus by PCR: NEGATIVE
SARS Coronavirus 2 by RT PCR: NEGATIVE

## 2023-12-19 LAB — HEPATIC FUNCTION PANEL
ALT: 18 U/L (ref 0–44)
AST: 35 U/L (ref 15–41)
Albumin: 3.4 g/dL — ABNORMAL LOW (ref 3.5–5.0)
Alkaline Phosphatase: 83 U/L (ref 38–126)
Bilirubin, Direct: 0.2 mg/dL (ref 0.0–0.2)
Indirect Bilirubin: 0.5 mg/dL (ref 0.3–0.9)
Total Bilirubin: 0.7 mg/dL (ref 0.0–1.2)
Total Protein: 6.7 g/dL (ref 6.5–8.1)

## 2023-12-19 LAB — BASIC METABOLIC PANEL
Anion gap: 12 (ref 5–15)
BUN: 18 mg/dL (ref 8–23)
CO2: 24 mmol/L (ref 22–32)
Calcium: 9.1 mg/dL (ref 8.9–10.3)
Chloride: 103 mmol/L (ref 98–111)
Creatinine, Ser: 1.35 mg/dL — ABNORMAL HIGH (ref 0.61–1.24)
GFR, Estimated: 54 mL/min — ABNORMAL LOW (ref >60)
Glucose, Bld: 109 mg/dL — ABNORMAL HIGH (ref 70–99)
Potassium: 4.1 mmol/L (ref 3.5–5.1)
Sodium: 139 mmol/L (ref 135–145)

## 2023-12-19 LAB — HEMOGLOBIN A1C
Hgb A1c MFr Bld: 5.5 % (ref 4.8–5.6)
Mean Plasma Glucose: 111.15 mg/dL

## 2023-12-19 LAB — BRAIN NATRIURETIC PEPTIDE: B Natriuretic Peptide: 130.3 pg/mL — ABNORMAL HIGH (ref 0.0–100.0)

## 2023-12-19 LAB — TROPONIN I (HIGH SENSITIVITY)
Troponin I (High Sensitivity): 13 ng/L (ref <18)
Troponin I (High Sensitivity): 13 ng/L (ref <18)

## 2023-12-19 LAB — PROCALCITONIN: Procalcitonin: <0.1 ng/mL

## 2023-12-19 MED ORDER — ACETAMINOPHEN 325 MG PO TABS
650.0000 mg | ORAL_TABLET | Freq: Once | ORAL | Status: AC | PRN
  Administered 2023-12-19: 650 mg via ORAL
  Filled 2023-12-19: qty 2

## 2023-12-19 MED ORDER — SODIUM CHLORIDE 0.9 % IV SOLN
2.0000 g | Freq: Once | INTRAVENOUS | Status: AC
  Administered 2023-12-19: 2 g via INTRAVENOUS
  Filled 2023-12-19: qty 20

## 2023-12-19 MED ORDER — RIVAROXABAN 20 MG PO TABS
20.0000 mg | ORAL_TABLET | Freq: Every day | ORAL | Status: DC
  Administered 2023-12-19 – 2023-12-24 (×6): 20 mg via ORAL
  Filled 2023-12-19 (×6): qty 1

## 2023-12-19 MED ORDER — ALLOPURINOL 100 MG PO TABS
100.0000 mg | ORAL_TABLET | Freq: Every day | ORAL | Status: DC
Start: 2023-12-19 — End: 2023-12-21
  Administered 2023-12-19 – 2023-12-20 (×2): 100 mg via ORAL
  Filled 2023-12-19 (×2): qty 1

## 2023-12-19 MED ORDER — ONDANSETRON HCL 4 MG PO TABS: 4.0000 mg | ORAL_TABLET | Freq: Four times a day (QID) | ORAL | Status: DC | PRN

## 2023-12-19 MED ORDER — ATORVASTATIN CALCIUM 40 MG PO TABS
40.0000 mg | ORAL_TABLET | Freq: Every day | ORAL | Status: DC
  Administered 2023-12-20 – 2023-12-25 (×6): 40 mg via ORAL
  Filled 2023-12-19 (×6): qty 1

## 2023-12-19 MED ORDER — BUDESONIDE 0.25 MG/2ML IN SUSP: 0.2500 mg | Freq: Two times a day (BID) | RESPIRATORY_TRACT | Status: DC

## 2023-12-19 MED ORDER — ONDANSETRON HCL 4 MG/2ML IJ SOLN
4.0000 mg | Freq: Four times a day (QID) | INTRAMUSCULAR | Status: DC | PRN
Start: 2023-12-19 — End: 2023-12-25

## 2023-12-19 MED ORDER — HYDRALAZINE HCL 20 MG/ML IJ SOLN: 5.0000 mg | INTRAMUSCULAR | Status: DC | PRN

## 2023-12-19 MED ORDER — CARVEDILOL 3.125 MG PO TABS
3.1250 mg | ORAL_TABLET | Freq: Two times a day (BID) | ORAL | Status: DC
Start: 2023-12-19 — End: 2023-12-21
  Administered 2023-12-19 – 2023-12-21 (×4): 3.125 mg via ORAL
  Filled 2023-12-19 (×4): qty 1

## 2023-12-19 MED ORDER — SERTRALINE HCL 50 MG PO TABS
25.0000 mg | ORAL_TABLET | Freq: Every day | ORAL | Status: DC
  Administered 2023-12-20 – 2023-12-25 (×6): 25 mg via ORAL
  Filled 2023-12-19 (×6): qty 1

## 2023-12-19 MED ORDER — OSELTAMIVIR PHOSPHATE 75 MG PO CAPS
75.0000 mg | ORAL_CAPSULE | Freq: Once | ORAL | Status: AC
  Administered 2023-12-19: 75 mg via ORAL
  Filled 2023-12-19: qty 1

## 2023-12-19 MED ORDER — ACETAMINOPHEN 325 MG PO TABS
650.0000 mg | ORAL_TABLET | Freq: Four times a day (QID) | ORAL | Status: DC | PRN
  Administered 2023-12-19 – 2023-12-24 (×7): 650 mg via ORAL
  Filled 2023-12-19 (×7): qty 2

## 2023-12-19 MED ORDER — HYDROCODONE-ACETAMINOPHEN 5-325 MG PO TABS
1.0000 | ORAL_TABLET | ORAL | Status: DC | PRN
Start: 2023-12-19 — End: 2023-12-25

## 2023-12-19 MED ORDER — ALBUTEROL SULFATE (2.5 MG/3ML) 0.083% IN NEBU: 2.5000 mg | INHALATION_SOLUTION | RESPIRATORY_TRACT | Status: DC | PRN

## 2023-12-19 MED ORDER — MORPHINE SULFATE (PF) 2 MG/ML IV SOLN: 2.0000 mg | INTRAVENOUS | Status: DC | PRN

## 2023-12-19 MED ORDER — ACETAMINOPHEN 650 MG RE SUPP: 650.0000 mg | Freq: Four times a day (QID) | RECTAL | Status: DC | PRN

## 2023-12-19 MED ORDER — SODIUM CHLORIDE 0.9 % IV SOLN
2.0000 g | INTRAVENOUS | Status: DC
  Administered 2023-12-20: 2 g via INTRAVENOUS
  Filled 2023-12-19: qty 20

## 2023-12-19 MED ORDER — OSELTAMIVIR PHOSPHATE 30 MG PO CAPS
30.0000 mg | ORAL_CAPSULE | Freq: Two times a day (BID) | ORAL | Status: DC
  Administered 2023-12-19 – 2023-12-23 (×9): 30 mg via ORAL
  Filled 2023-12-19 (×10): qty 1

## 2023-12-19 MED ORDER — SODIUM CHLORIDE 0.9 % IV SOLN: Freq: Once | INTRAVENOUS | Status: AC

## 2023-12-19 MED ORDER — BUDESONIDE 0.25 MG/2ML IN SUSP
0.2500 mg | Freq: Two times a day (BID) | RESPIRATORY_TRACT | Status: DC
  Administered 2023-12-19 – 2023-12-25 (×9): 0.25 mg via RESPIRATORY_TRACT
  Filled 2023-12-19 (×11): qty 2

## 2023-12-19 NOTE — Assessment & Plan Note (Addendum)
 Continue with PPI therapy

## 2023-12-19 NOTE — Assessment & Plan Note (Addendum)
 Lab Results  Component Value Date   CREATININE 1.35 (H) 12/19/2023   CREATININE 1.18 07/30/2022   CREATININE 1.32 (H) 07/09/2022  Hold patient's ACE inhibitor.

## 2023-12-19 NOTE — ED Provider Notes (Addendum)
 [place] [facility] AT [facility] Provider Note   CSN: [id number] Arrival date & time: 12/19/23  1051     History  Chief Complaint  Patient presents with   Shortness of Breath   Fever   Cough    Peter [name] is a 78 y.o. male.  HPI 78 year old male with a history of hypertension, CAD, dyslipidemia, GERD, CKD, A-fib, heart failure (EF of 55 to 60%) who presents to the ER with shortness of breath, cough, myalgias.  Patient states that people have been sick at his work.  He endorses significant dyspnea with exertion.  He has been compliant with his Lasix  and has not noticed any worsening lower extremity edema.  He also developed a productive cough which started yesterday.  He was seen at urgent care and was told to come to the emergency department after review of his chest x-ray.    Home Medications Prior to Admission medications   Medication Sig Start Date End Date Taking? Authorizing Provider  allopurinol  (ZYLOPRIM ) 100 MG tablet Take 100 mg by mouth at bedtime. 11/19/21  Yes [provider]  allopurinol  (ZYLOPRIM ) 300 MG tablet Take 300 mg by mouth daily.   Yes [provider]  atorvastatin  (LIPITOR) 40 MG tablet TAKE 1 TABLET EVERY DAY 10/22/23  Yes Camnitz, Will Gladis, MD  carvedilol  (COREG ) 25 MG tablet TAKE 1 TABLET TWICE DAILY WITH MEALS 04/17/23  Yes Camnitz, Soyla Gladis, MD  diphenhydramine -acetaminophen  (TYLENOL  PM) 25-500 MG TABS tablet Take 1 tablet by mouth at bedtime.   Yes [provider]  ferrous sulfate  325 (65 FE) MG tablet Take 1 tablet (325 mg total) by mouth every morning. 10/27/18  Yes Camnitz, Will Gladis, MD  furosemide  (LASIX ) 40 MG tablet TAKE 1 TABLET EVERY DAY 04/17/23  Yes Camnitz, Soyla Gladis, MD  lisinopril  (ZESTRIL ) 40 MG tablet TAKE 1 TABLET EVERY DAY 06/10/23  Yes Camnitz, Soyla Gladis, MD  nitroGLYCERIN  (NITROSTAT ) 0.4 MG SL tablet Place 1 tablet (0.4 mg total) under the tongue every 5 (five) minutes as  needed for chest pain. X 3 doses 04/09/23  Yes Swinyer, Rosaline HERO, NP  pantoprazole  (PROTONIX ) 40 MG tablet TAKE 1 TABLET EVERY DAY (NEED MD APPOINTMENT) 04/17/23  Yes Camnitz, Will Gladis, MD  polyvinyl alcohol  (LIQUIFILM TEARS) 1.4 % ophthalmic solution Place 1 drop into both eyes daily.   Yes [provider]  rivaroxaban  (XARELTO ) 20 MG TABS tablet Take 1 tablet (20 mg total) by mouth daily. Patient taking differently: Take 20 mg by mouth at bedtime. 12/01/23  Yes Burnard Debby LABOR, MD  sertraline  (ZOLOFT ) 25 MG tablet Take 25 mg by mouth daily. 06/17/22  Yes [provider]      Allergies    Spironolactone     Review of Systems   Review of Systems Ten systems reviewed and are negative for acute change, except as noted in the HPI.   Physical Exam Updated Vital Signs BP 139/87   Pulse 78   Temp 99 F (37.2 C) (Oral)   Resp 18   Wt 98 kg   SpO2 95%   BMI 32.85 kg/m  Physical Exam Vitals and nursing note reviewed.  Constitutional:      General: He is not in acute distress.    Appearance: He is well-developed.  HENT:     Head: Normocephalic and atraumatic.  Eyes:     Conjunctiva/sclera: Conjunctivae normal.  Cardiovascular:     Rate and Rhythm: Normal rate and regular rhythm.  Heart sounds: No murmur heard. Pulmonary:     Effort: Pulmonary effort is normal. No respiratory distress.     Breath sounds: Rhonchi present.  Chest:     Chest wall: No tenderness.  Abdominal:     Palpations: Abdomen is soft.     Tenderness: There is no abdominal tenderness.  Musculoskeletal:        General: No swelling.     Cervical back: Neck supple.     Right lower leg: No edema.     Left lower leg: No edema.  Skin:    General: Skin is warm and dry.     Capillary Refill: Capillary refill takes less than 2 seconds.  Neurological:     Mental Status: He is alert.  Psychiatric:        Mood and Affect: Mood normal.     ED Results / Procedures / Treatments   Labs (all  labs ordered are listed, but only abnormal results are displayed) Labs Reviewed  RESP PANEL BY RT-PCR (RSV, FLU A&B, COVID)  RVPGX2 - Abnormal; Notable for the following components:      Result Value   Influenza A by PCR POSITIVE (*)    All other components within normal limits  BASIC METABOLIC PANEL - Abnormal; Notable for the following components:   Glucose, Bld 109 (*)    Creatinine, Ser 1.35 (*)    GFR, Estimated 54 (*)    All other components within normal limits  CBC WITH DIFFERENTIAL/PLATELET - Abnormal; Notable for the following components:   RBC 3.73 (*)    Hemoglobin 12.6 (*)    HCT 38.7 (*)    MCV 103.8 (*)    Platelets 144 (*)    Lymphs Abs 0.2 (*)    All other components within normal limits  BRAIN NATRIURETIC PEPTIDE - Abnormal; Notable for the following components:   B Natriuretic Peptide 130.3 (*)    All other components within normal limits  CULTURE, BLOOD (ROUTINE X 2)  CULTURE, BLOOD (ROUTINE X 2)  PROCALCITONIN  TROPONIN I (HIGH SENSITIVITY)  TROPONIN I (HIGH SENSITIVITY)    EKG EKG Interpretation Date/Time:  Friday December 19 2023 11:09:37 EST Ventricular Rate:  78 PR Interval:    QRS Duration:  128 QT Interval:  394 QTC Calculation: 449 R Axis:   1  Text Interpretation: Normal sinus rhythm Indeterminate axis Right bundle branch block Possible Lateral infarct , age undetermined Confirmed by Freddi Hamilton [phone]) on 12/19/2023 2:15:04 PM  Radiology DG Chest 2 View Result Date: 12/19/2023 CLINICAL DATA:  78 year old male with cough and shortness of breath for 5 days. EXAM: CHEST - 2 VIEW COMPARISON:  Chest radiographs 0955 hours today and earlier. FINDINGS: PA and lateral views at 1126 hours. Most recent prior chest x-ray before today is 2017. Patchy and confluent right upper lung and left lung base opacity is chronic to a degree but appears progressed since 2017, unchanged from earlier today. Cardiac CT in 2017 with evidence of interstitial lung disease  at that time. Cardiomegaly. Gastric hiatal hernia on the 2017 CT also. Other mediastinal contours are within normal limits. Visualized tracheal air column is within normal limits. No pneumothorax. No pleural effusion. No pulmonary edema suspected. No acute osseous abnormality identified. Negative visible bowel gas. 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 Favor chronic pulmonary fibrosis. Superimposed cardiomegaly, chronic hiatal hernia. No definite acute cardiopulmonary abnormality. Electronically Signed   By: VEAR Shona HERO.D.  On: 12/19/2023 12:25    Procedures Procedures    Medications Ordered in ED Medications  acetaminophen  (TYLENOL ) tablet 650 mg (650 mg Oral Given 12/19/23 1121)  cefTRIAXone  (ROCEPHIN ) 2 g in sodium chloride  0.9 % 100 mL IVPB (0 g Intravenous Stopped 12/19/23 1453)  oseltamivir  (TAMIFLU ) capsule 75 mg (75 mg Oral Given 12/19/23 1521)    ED Course/ Medical Decision Making/ A&P Clinical Course as of 12/19/23 1542  Fri Dec 19, 2023  4427 78 year old male presents to the ER with complaints of shortness of breath, dyspnea with exertion, cough.  Vitals on arrival with fever of one 1.5, blood pressure 136/116, slightly tachypneic with a respiratory rate between 26 and 30, SpO2 of 96%, however on my exam O2 sats in the low 90s (90-92).  He is fairly dyspneic with just speaking.  No significant lower extremity edema noted.  Scattered rhonchi heard in the upper lobes.  Differential diagnosis includes pneumonia, viral infection, CHF exacerbation, PE  Labs ordered, reviewed, interpreted by me.  CBC with a hemoglobin of 12.6, creatinine appears to be more or less at baseline 1.35, BNP 130, initial troponin negative.  His influenza A test is positive.  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 Favor chronic pulmonary  fibrosis. Superimposed cardiomegaly, chronic hiatal hernia.  Blood cultures obtained and he was given a dose of Rocephin .  On reassessment, patient still seems somewhat dyspneic even with just speaking.  I have lower suspicion for CHF exacerbation and given no significant pulmonary edema noted on chest x-ray, will hold off on diuresis.  Will ambulate with pulse ox.  Will give Tamiflu .  Suspect he may need admission. [MB]  1513 Patient ambulated, sats in the low 90s.  He was still symptomatic and dyspneic.  I believe that he would benefit from admission. Pt is agreeable. Consult to hospitalist placed  [MB]  1536 S- hx CHF Worsening DOE, productive cough since y/d UC sent here Flu A positive, infiltrates on CXR SpO2 upper 80s-low 90  [GG]    Clinical Course User Index [GG] Larna Raring, MD [MB] Vonn Hadassah LABOR, PA-C                                 Medical Decision Making Risk OTC drugs. Prescription drug management.    Final Clinical Impression(s) / ED Diagnoses Final diagnoses:  Influenza A    Rx / DC Orders ED Discharge Orders     None         Vonn Hadassah LABOR, PA-C 12/19/23 1537    Vonn Hadassah LABOR, PA-C 12/19/23 1542    Freddi Hamilton, MD 12/19/23 [phone]

## 2023-12-19 NOTE — H&P (Signed)
 History and Physical    Patient: Peter [name] FMW:[id number] DOB: 02/05/1946 DOA: 12/19/2023 DOS: the patient was seen and examined on 12/19/2023 PCP: [name], MD  Patient coming from: Home Chief complaint: Chief Complaint  Patient presents with   Shortness of Breath   Fever   Cough   HPI:  Peter [name] is a 78 y.o. male with past medical history  of hypertension, CAD, GERD, CKD stage III, IDA, A-fib on Xarelto , chronic systolic congestive heart failure, presenting with shortness of breath cough chills for about 5 days, no reports of chest pain palpitation nausea vomiting diarrhea headaches speech or gait issues no falls. Pt does endorse SOBOE.  No reports of lower extremity edema or dizziness.  Patient went to the urgent care where he had a chest x-ray and was told to come to the emergency room.  Of note patient is allergic to Aldactone .  >>ED Course: In emergency room patient meets sepsis criteria with vitals and infection source. Admission requested for flu shortness of breath respiratory failure. Vitals:   12/19/23 1107 12/19/23 1111 12/19/23 1352 12/19/23 1456  BP: (!) 136/116  (!) 118/53   Pulse: 82  84   Temp: (!) 101.5 F (38.6 C)     Resp: (!) 26  (!) 25   Weight:  98 kg    SpO2: 96%  96% 90%  BMI (Calculated):  32.86    EKG shows sinus rhythm at 78 with a right bundle branch block.  BNP 130.3 troponin 13 x 2 ED evaluation  so far shows: Chest x-ray shows chronic pulmonary interstitial fibrosis with patchy bilateral lung opacities that is progressed from 2017. Basic metabolic panel shows AKI with a creatinine of 1.35 EGFR 54, LFTs added on and pending. CBC shows white count of 7.9 hemoglobin of 12.6 MCV of 103.8 platelets of 144. Respiratory panel positive for influenza A.   In the emergency room  pt has received the following treatment thus far: Medications  oseltamivir  (TAMIFLU ) capsule 75 mg (has no administration in time range)  acetaminophen   (TYLENOL ) tablet 650 mg (650 mg Oral Given 12/19/23 1121)  cefTRIAXone  (ROCEPHIN ) 2 g in sodium chloride  0.9 % 100 mL IVPB (0 g Intravenous Stopped 12/19/23 1453)   Review of Systems  Constitutional:  Positive for fever.  Respiratory:  Positive for cough and shortness of breath.    Past Medical History:  Diagnosis Date   Arthritis    At risk for sleep apnea    STOP-BANG= 5       SENT TO PCP 07-12-2015   CAD (coronary artery disease)    a. s/p PCI with DES x 3 to LAD in 2006 with bifurcation lesion and Plavix  indefinitely was recommended.   Chronic combined systolic and diastolic CHF (congestive heart failure) (HCC)    a. Prior EF normal but EF dropped to 30-35% in 07/2016 in setting of AFIB.   CKD (chronic kidney disease), stage III (HCC)    Depression    Dyslipidemia    Dyspnea    tired quick d/t Afib   Dyspnea on exertion    Dysrhythmia    A.FIB   GERD (gastroesophageal reflux disease)    History of GI bleed    History of kidney stones    History of MI (myocardial infarction)    11-05-2005--  periprocedural MI  (cardiac cath) per dr obie note   Hypertension    Hypertriglyceridemia    Iron deficiency anemia    Myocardial infarction (HCC)  Persistent atrial fibrillation (HCC)    a. 07/2016, started on Eliquis  -> readmitted later that month for AF RVR/acute HF - initially unsuccessful DCCV following TEE, so loaded with amio with repeat successful DCCV 07/2816.   Right ureteral stone    S/P drug eluting coronary stent placement    x3  to LAD  2006   Sleep apnea    wears BIPAP, 14 up to 24   Past Surgical History:  Procedure Laterality Date   CARDIOVASCULAR STRESS TEST  01-06-2013  dr micky   normal perfusion study/  no ischemia or infarct/  normal LV function and wall motion , ef 57%   CARDIOVERSION N/A 08/05/2016   Procedure: CARDIOVERSION;  Surgeon: Jerel Balding, MD;  Location: [facility] ENDOSCOPY;  Service: Cardiovascular;  Laterality: N/A;   CARDIOVERSION N/A 08/08/2016    Procedure: CARDIOVERSION;  Surgeon: Oneil JAYSON Parchment, MD;  Location: [facility] ENDOSCOPY;  Service: Cardiovascular;  Laterality: N/A;   CORONARY ANGIOPLASTY  11-13-2005 dr obie   Successful touch up post-dilatation of the 3 tandem overlying Cypher stents in proximal to mid LAD (based on IVUS 0% stenosis and patent diagonal branch)/  Cutting Balloon Angioplasty of Ramus branch   CORONARY ANGIOPLASTY WITH STENT PLACEMENT  11-05-2005   dr wolm brodie   PCI and DES x3 to birfurcation lesion LAD and diagonal branch of LAD/   20% dLM,  30-40% RCA, ef 60%   CYSTOSCOPY WITH RETROGRADE PYELOGRAM, URETEROSCOPY AND STENT PLACEMENT Right 07/14/2015   Procedure: CYSTOSCOPY WITH RETROGRADE PYELOGRAM, URETEROSCOPY AND STENT PLACEMENT;  Surgeon: Ricardo Likens, MD;  Location: [facility];  Service: Urology;  Laterality: Right;   ELECTROPHYSIOLOGIC STUDY N/A 10/24/2016   Procedure: Atrial Fibrillation Ablation;  Surgeon: Will Gladis Norton, MD;  Location: [facility] INVASIVE CV LAB;  Service: Cardiovascular;  Laterality: N/A;   EXTRACORPOREAL SHOCK WAVE LITHOTRIPSY  yrs ago   HEMORRHOID SURGERY N/A 10/24/2017   Procedure: HEMORRHOIDECTOMY;  Surgeon: Teresa Lonni HERO, MD;  Location: [facility] OR;  Service: General;  Laterality: N/A;   HERNIA REPAIR Bilateral 2000   Inguinal Hernia   IR GENERIC HISTORICAL  10/23/2016   IR FLUORO GUIDE CV LINE RIGHT 10/23/2016 Ozell Specking, MD [facility]   IR GENERIC HISTORICAL  10/23/2016   IR US  GUIDE VASC ACCESS RIGHT 10/23/2016 Ozell Specking, MD [facility]   LEFT URETEROSCOPIC STONE EXTRACTION  04/10/2006   STONE EXTRACTION WITH BASKET Right 07/14/2015   Procedure: STONE EXTRACTION WITH BASKET;  Surgeon: Ricardo Likens, MD;  Location: [facility];  Service: Urology;  Laterality: Right;   TEE WITHOUT CARDIOVERSION N/A 08/05/2016   Procedure: TRANSESOPHAGEAL ECHOCARDIOGRAM (TEE);  Surgeon: Jerel Balding, MD;  Location: [facility] ENDOSCOPY;  Service: Cardiovascular;   Laterality: N/A;   TOTAL HIP ARTHROPLASTY Right 07/16/2022   Procedure: RIGHT TOTAL HIP ARTHROPLASTY ANTERIOR APPROACH;  Surgeon: Jerri Kay HERO, MD;  Location: [facility] OR;  Service: Orthopedics;  Laterality: Right;  3-C   TRANSTHORACIC ECHOCARDIOGRAM  02/04/2012   grade I diastolic dysfunction/  ef 55-60%/  trivial MR and TR/  mild LAE    reports that he has never smoked. He has never used smokeless tobacco. He reports that he does not drink alcohol  and does not use drugs.  Allergies  Allergen Reactions   Spironolactone      Tender breast    Family History  Problem Relation Age of Onset   Heart attack Father 72   Congestive Heart Failure Father 47   Cancer Other     Prior to  Admission medications   Medication Sig Start Date End Date Taking? Authorizing Provider  allopurinol  (ZYLOPRIM ) 100 MG tablet Take 100 mg by mouth at bedtime. 11/19/21  Yes [provider]  allopurinol  (ZYLOPRIM ) 300 MG tablet Take 300 mg by mouth daily.   Yes [provider]  atorvastatin  (LIPITOR) 40 MG tablet TAKE 1 TABLET EVERY DAY 10/22/23  Yes Camnitz, Will Gladis, MD  carvedilol  (COREG ) 25 MG tablet TAKE 1 TABLET TWICE DAILY WITH MEALS 04/17/23  Yes Camnitz, Soyla Gladis, MD  diphenhydramine -acetaminophen  (TYLENOL  PM) 25-500 MG TABS tablet Take 1 tablet by mouth at bedtime.   Yes [provider]  ferrous sulfate  325 (65 FE) MG tablet Take 1 tablet (325 mg total) by mouth every morning. 10/27/18  Yes Camnitz, Will Gladis, MD  furosemide  (LASIX ) 40 MG tablet TAKE 1 TABLET EVERY DAY 04/17/23  Yes Camnitz, Soyla Gladis, MD  lisinopril  (ZESTRIL ) 40 MG tablet TAKE 1 TABLET EVERY DAY 06/10/23  Yes Camnitz, Soyla Gladis, MD  nitroGLYCERIN  (NITROSTAT ) 0.4 MG SL tablet Place 1 tablet (0.4 mg total) under the tongue every 5 (five) minutes as needed for chest pain. X 3 doses 04/09/23  Yes Swinyer, Rosaline HERO, NP  pantoprazole  (PROTONIX ) 40 MG tablet TAKE 1 TABLET EVERY DAY (NEED MD APPOINTMENT) 04/17/23  Yes  Camnitz, Soyla Gladis, MD  polyvinyl alcohol  (LIQUIFILM TEARS) 1.4 % ophthalmic solution Place 1 drop into both eyes daily.   Yes [provider]  rivaroxaban  (XARELTO ) 20 MG TABS tablet Take 1 tablet (20 mg total) by mouth daily. Patient taking differently: Take 20 mg by mouth at bedtime. 12/01/23  Yes Burnard Debby LABOR, MD  sertraline  (ZOLOFT ) 25 MG tablet Take 25 mg by mouth daily. 06/17/22  Yes [provider]     Vitals:   12/19/23 1352 12/19/23 1456 12/19/23 1533 12/19/23 1539  BP: (!) 118/53  139/87   Pulse: 84  78   Resp: (!) 25  18   Temp:    99 F (37.2 C)  TempSrc:    Oral  SpO2: 96% 90% 95%   Weight:       Physical Exam Vitals and nursing note reviewed.  Constitutional:      General: He is not in acute distress.    Appearance: He is obese.  HENT:     Head: Normocephalic and atraumatic.     Right Ear: Hearing normal.     Left Ear: Hearing normal.     Nose: Nose normal. No nasal deformity.     Mouth/Throat:     Lips: Pink.     Tongue: No lesions.     Pharynx: Oropharynx is clear.  Eyes:     General: Lids are normal.     Extraocular Movements: Extraocular movements intact.  Cardiovascular:     Rate and Rhythm: Normal rate and regular rhythm.     Heart sounds: Normal heart sounds.  Pulmonary:     Effort: Pulmonary effort is normal.     Breath sounds: Wheezing present.  Abdominal:     General: Bowel sounds are normal. There is no distension.     Palpations: Abdomen is soft. There is no mass.     Tenderness: There is no abdominal tenderness.  Musculoskeletal:     Right lower leg: No edema.     Left lower leg: No edema.  Skin:    General: Skin is warm.  Neurological:     General: No focal deficit present.     Mental Status: He is  alert and oriented to person, place, and time.     Cranial Nerves: Cranial nerves 2-12 are intact.     Motor: No weakness, tremor or atrophy.  Psychiatric:        Attention and Perception: Attention normal.         Mood and Affect: Mood normal.        Speech: Speech normal.        Behavior: Behavior is cooperative.      Labs on Admission: I have personally reviewed following labs and imaging studies Results for orders placed or performed during the hospital encounter of 12/19/23 (from the past 24 hours)  Basic metabolic panel     Status: Abnormal   Collection Time: 12/19/23 11:17 AM  Result Value Ref Range   Sodium 139 135 - 145 mmol/L   Potassium 4.1 3.5 - 5.1 mmol/L   Chloride 103 98 - 111 mmol/L   CO2 24 22 - 32 mmol/L   Glucose, Bld 109 (H) 70 - 99 mg/dL   BUN 18 8 - 23 mg/dL   Creatinine, Ser 8.64 (H) 0.61 - 1.24 mg/dL   Calcium  9.1 8.9 - 10.3 mg/dL   GFR, Estimated 54 (L) >60 mL/min   Anion gap 12 5 - 15  CBC with Differential     Status: Abnormal   Collection Time: 12/19/23 11:17 AM  Result Value Ref Range   WBC 7.9 4.0 - 10.5 K/uL   RBC 3.73 (L) 4.22 - 5.81 MIL/uL   Hemoglobin 12.6 (L) 13.0 - 17.0 g/dL   HCT 61.2 (L) 60.9 - 47.9 %   MCV 103.8 (H) 80.0 - 100.0 fL   MCH 33.8 26.0 - 34.0 pg   MCHC 32.6 30.0 - 36.0 g/dL   RDW 86.0 88.4 - 84.4 %   Platelets 144 (L) 150 - 400 K/uL   nRBC 0.0 0.0 - 0.2 %   Neutrophils Relative % 86 %   Neutro Abs 6.8 1.7 - 7.7 K/uL   Lymphocytes Relative 3 %   Lymphs Abs 0.2 (L) 0.7 - 4.0 K/uL   Monocytes Relative 10 %   Monocytes Absolute 0.8 0.1 - 1.0 K/uL   Eosinophils Relative 1 %   Eosinophils Absolute 0.1 0.0 - 0.5 K/uL   Basophils Relative 0 %   Basophils Absolute 0.0 0.0 - 0.1 K/uL   Immature Granulocytes 0 %   Abs Immature Granulocytes 0.03 0.00 - 0.07 K/uL  Brain natriuretic peptide     Status: Abnormal   Collection Time: 12/19/23 11:17 AM  Result Value Ref Range   B Natriuretic Peptide 130.3 (H) 0.0 - 100.0 pg/mL  Troponin I (High Sensitivity)     Status: None   Collection Time: 12/19/23 11:17 AM  Result Value Ref Range   Troponin I (High Sensitivity) 13 <18 ng/L  Resp panel by RT-PCR (RSV, Flu A&B, Covid) Anterior Nasal Swab      Status: Abnormal   Collection Time: 12/19/23 11:17 AM   Specimen: Anterior Nasal Swab  Result Value Ref Range   SARS Coronavirus 2 by RT PCR NEGATIVE NEGATIVE   Influenza A by PCR POSITIVE (A) NEGATIVE   Influenza B by PCR NEGATIVE NEGATIVE   Resp Syncytial Virus by PCR NEGATIVE NEGATIVE  Troponin I (High Sensitivity)     Status: None   Collection Time: 12/19/23  1:05 PM  Result Value Ref Range   Troponin I (High Sensitivity) 13 <18 ng/L   Recent Results (from the past 720 hours)  Resp panel  by RT-PCR (RSV, Flu A&B, Covid) Anterior Nasal Swab     Status: Abnormal   Collection Time: 12/19/23 11:17 AM   Specimen: Anterior Nasal Swab  Result Value Ref Range Status   SARS Coronavirus 2 by RT PCR NEGATIVE NEGATIVE Final   Influenza A by PCR POSITIVE (A) NEGATIVE Final   Influenza B by PCR NEGATIVE NEGATIVE Final    Comment: (NOTE) The Xpert Xpress SARS-CoV-2/FLU/RSV plus assay is intended as an aid in the diagnosis of influenza from Nasopharyngeal swab specimens and should not be used as a sole basis for treatment. Nasal washings and aspirates are unacceptable for Xpert Xpress SARS-CoV-2/FLU/RSV testing.  Fact Sheet for Patients: bloggercourse.com  Fact Sheet for Healthcare Providers: seriousbroker.it  This test is not yet approved or cleared by the [place]  FDA and has been authorized for detection and/or diagnosis of SARS-CoV-2 by FDA under an Emergency Use Authorization (EUA). This EUA will remain in effect (meaning this test can be used) for the duration of the COVID-19 declaration under Section 564(b)(1) of the Act, 21 U.S.C. section 360bbb-3(b)(1), unless the authorization is terminated or revoked.     Resp Syncytial Virus by PCR NEGATIVE NEGATIVE Final    Comment: (NOTE) Fact Sheet for Patients: bloggercourse.com  Fact Sheet for Healthcare  Providers: seriousbroker.it  This test is not yet approved or cleared by the [place]  FDA and has been authorized for detection and/or diagnosis of SARS-CoV-2 by FDA under an Emergency Use Authorization (EUA). This EUA will remain in effect (meaning this test can be used) for the duration of the COVID-19 declaration under Section 564(b)(1) of the Act, 21 U.S.C. section 360bbb-3(b)(1), unless the authorization is terminated or revoked.  Performed at [facility], [address] [place], [place] [address]    CBC:    Latest Ref Rng & Units 12/19/2023   11:17 AM 07/30/2022    3:22 AM 07/17/2022    9:56 AM  CBC  WBC 4.0 - 10.5 K/uL 7.9  18.7  16.7   Hemoglobin 13.0 - 17.0 g/dL 87.3  87.8  86.5   Hematocrit 39.0 - 52.0 % 38.7  36.8  39.0   Platelets 150 - 400 K/uL 144  171  123    Basic Metabolic Panel: Recent Labs  Lab 12/19/23 1117  NA 139  K 4.1  CL 103  CO2 24  GLUCOSE 109*  BUN 18  CREATININE 1.35*  CALCIUM  9.1   Creatinine: Lab Results  Component Value Date   CREATININE 1.35 (H) 12/19/2023   CREATININE 1.18 07/30/2022   CREATININE 1.32 (H) 07/09/2022   Liver Function Tests:    Latest Ref Rng & Units 07/30/2022    3:22 AM 07/09/2022   10:00 AM 02/28/2018    1:20 PM  Hepatic Function  Total Protein 6.5 - 8.1 g/dL 6.4  6.6  7.7   Albumin 3.5 - 5.0 g/dL 3.5  3.8  3.9   AST 15 - 41 U/L 17  23  15    ALT 0 - 44 U/L 14  19  15    Alk Phosphatase 38 - 126 U/L 103  89  81   Total Bilirubin 0.3 - 1.2 mg/dL 0.9  0.6  1.2    Coagulation Profile: No results for input(s): INR, PROTIME in the last 168 hours. Cardiac Enzymes: No results for input(s): CKTOTAL, CKMB, CKMBINDEX, TROPONINI in the last 168 hours. BNP (last 3 results) No results for input(s): PROBNP in the last 8760 hours. HbA1C: No results for  input(s): HGBA1C in the last 72 hours. Lipid Profile: No results for input(s): CHOL, HDL, LDLCALC, TRIG,  CHOLHDL, LDLDIRECT in the last 72 hours.  Radiological Exams on Admission: DG Chest 2 View Result Date: 12/19/2023 CLINICAL DATA:  78 year old male with cough and shortness of breath for 5 days. EXAM: CHEST - 2 VIEW COMPARISON:  Chest radiographs 0955 hours today and earlier. FINDINGS: PA and lateral views at 1126 hours. Most recent prior chest x-ray before today is 2017. Patchy and confluent right upper lung and left lung base opacity is chronic to a degree but appears progressed since 2017, unchanged from earlier today. Cardiac CT in 2017 with evidence of interstitial lung disease at that time. Cardiomegaly. Gastric hiatal hernia on the 2017 CT also. Other mediastinal contours are within normal limits. Visualized tracheal air column is within normal limits. No pneumothorax. No pleural effusion. No pulmonary edema suspected. No acute osseous abnormality identified. Negative visible bowel gas. 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 Favor chronic pulmonary fibrosis. Superimposed cardiomegaly, chronic hiatal hernia. No definite acute cardiopulmonary abnormality. Electronically Signed   By: VEAR Hurst M.D.   On: 12/19/2023 12:25    Data Reviewed: Relevant notes from primary care and specialist visits, past discharge summaries as available in EHR, including Care Everywhere. Prior diagnostic testing as pertinent to current admission diagnoses, Updated medications and problem lists for reconciliation ED course, including vitals, labs, imaging, treatment and response to treatment,Triage notes, nursing and pharmacy notes and ED provider's notes Notable results as noted in HPI.Discussed case with EDMD/ ED APP/ or Specialty MD on call and as needed.  Assessment & Plan Influenza A Cont with tamiflu / droplet precaution.  PRN albuterol  and antitussives.   Sepsis (HCC) Pt does meet sepsis criteria with vitals and source.   Follow C/S . Additional supportive measures as needed.  Vitals are stable and pt does not meet criteria for 30 cc/kg bolus due to his heart failure history and also because he is stable  as far as BP readings are.   Coronary artery disease due to lipid rich plaque Stable no chest pain or anginal equivalent symptoms.   AF (atrial fibrillation) (HCC) SR on exam. Cont xarelto  .  Hypertension Vitals:   12/19/23 1107 12/19/23 1352 12/19/23 1533  BP: (!) 136/116 (!) 118/53 139/87  Currently hold patient's lisinopril , Lasix , Coreg .  Anemia    Latest Ref Rng & Units 12/19/2023   11:17 AM 07/30/2022    3:22 AM 07/17/2022    9:56 AM  CBC  WBC 4.0 - 10.5 K/uL 7.9  18.7  16.7   Hemoglobin 13.0 - 17.0 g/dL 87.3  87.8  86.5   Hematocrit 39.0 - 52.0 % 38.7  36.8  39.0   Platelets 150 - 400 K/uL 144  171  123   Currently hemoglobin is 12.6 will monitor repeat CBC in the morning. Continue with PPI therapy.  CKD (chronic kidney disease), stage III (HCC) Lab Results  Component Value Date   CREATININE 1.35 (H) 12/19/2023   CREATININE 1.18 07/30/2022   CREATININE 1.32 (H) 07/09/2022  Hold patient's ACE inhibitor.   GERD (gastroesophageal reflux disease) Continue with PPI therapy.    DVT prophylaxis:  Xarelto   Consults:  None  Advance Care Planning:    Code Status: Full Code   Family Communication:  None  Disposition Plan:  Home  Severity of Illness: The appropriate patient status for this patient is INPATIENT. Inpatient status is judged  to be reasonable and necessary in order to provide the required intensity of service to ensure the patient's safety. The patient's presenting symptoms, physical exam findings, and initial radiographic and laboratory data in the context of their chronic comorbidities is felt to place them at high risk for further clinical deterioration. Furthermore, it is not anticipated that the patient will be medically stable for discharge from the hospital within  2 midnights of admission.   * I certify that at the point of admission it is my clinical judgment that the patient will require inpatient hospital care spanning beyond 2 midnights from the point of admission due to high intensity of service, high risk for further deterioration and high frequency of surveillance required.*  Author: Mario LULLA Blanch, MD 12/19/2023 4:14 PM  For on call review [email or web address].   Unresulted Labs (From admission, onward)     Start     Ordered   12/19/23 1604  Hemoglobin A1c  Add-on,   AD        12/19/23 1614   12/19/23 1552  Hepatic function panel  Add-on,   AD        12/19/23 1551   12/19/23 1520  Procalcitonin  Add-on,   AD       References:    Procalcitonin Lower Respiratory Tract Infection AND Sepsis Procalcitonin Algorithm   12/19/23 1529   12/19/23 1241  Blood culture (routine x 2)  BLOOD CULTURE X 2,   R      12/19/23 1240            Orders Placed This Encounter  Procedures   Resp panel by RT-PCR (RSV, Flu A&B, Covid) Anterior Nasal Swab   Blood culture (routine x 2)   DG Chest 2 View   Basic metabolic panel   CBC with Differential   Brain natriuretic peptide   Procalcitonin   Hepatic function panel   Hemoglobin A1c   Diet Heart Room service appropriate? Yes; Fluid consistency: Thin   ED Cardiac monitoring   Check Pulse Oximetry while ambulating   Apply Pneumonia Care Plan   Cardiac Monitoring Continuous x 24 hours Indications for use: Other; other indications for use: PNA   Vital signs   Notify physician (specify)   Mobility Protocol: No Restrictions RN to initiate protocols based on patient's level of care   Refer to Sidebar Report Refer to ICU, Med-Surg, Progressive, and Step-Down Mobility Protocol Sidebars   Initiate Adult Central Line Maintenance and Catheter Protocol for patients with central line (CVC, PICC, Port, Hemodialysis, Trialysis)   If patient diabetic or glucose greater than 140 notify physician for Sliding Scale Insulin   Orders   Intake and Output   Do not place and if present remove PureWick   Initiate Oral Care Protocol   Initiate Carrier Fluid Protocol   RN may order General Admission PRN Orders utilizing General Admission PRN medications (through manage orders) for the following patient needs: allergy symptoms (Claritin), cold sores (Carmex), cough (Robitussin DM), eye irritation (Liquifilm Tears), hemorrhoids (Tucks), indigestion (Maalox), minor skin irritation (Hydrocortisone  Cream), muscle pain (Ben Gay), nose irritation (saline nasal spray) and sore throat (Chloraseptic spray).   Full code   Consult to hospitalist   Consult to respiratory care treatment (RT)   Droplet precaution (Flu, Meningitis)   OT eval and treat   PT eval and treat   Pulse oximetry check with vital signs   Oxygen therapy Mode or (Route): Nasal cannula; Liters Per Minute: 2; Keep O2 saturation between: greater than  92 %   Incentive spirometry   ED EKG   Insert peripheral IV   Admit to Inpatient (patient's expected length of stay will be greater than 2 midnights or inpatient only procedure)   Aspiration precautions   Fall precautions

## 2023-12-19 NOTE — Assessment & Plan Note (Addendum)
 Pt does meet sepsis criteria with vitals and source.  Follow C/S . Additional supportive measures as needed.  Vitals are stable and pt does not meet criteria for 30 cc/kg bolus due to his heart failure history and also because he is stable  as far as BP readings are.

## 2023-12-19 NOTE — Assessment & Plan Note (Addendum)
 Stable no chest pain or anginal equivalent symptoms.

## 2023-12-19 NOTE — Assessment & Plan Note (Addendum)
 Vitals:   12/19/23 1107 12/19/23 1352 12/19/23 1533  BP: (!) 136/116 (!) 118/53 139/87  Currently hold patient's lisinopril , Lasix , Coreg .

## 2023-12-19 NOTE — Assessment & Plan Note (Addendum)
    Latest Ref Rng & Units 12/19/2023   11:17 AM 07/30/2022    3:22 AM 07/17/2022    9:56 AM  CBC  WBC 4.0 - 10.5 K/uL 7.9  18.7  16.7   Hemoglobin 13.0 - 17.0 g/dL 87.3  87.8  86.5   Hematocrit 39.0 - 52.0 % 38.7  36.8  39.0   Platelets 150 - 400 K/uL 144  171  123   Currently hemoglobin is 12.6 will monitor repeat CBC in the morning. Continue with PPI therapy.

## 2023-12-19 NOTE — ED Triage Notes (Signed)
 Pt send from PCP for further workup for Pneumonia and cough. Pt states cough and SOB x 5 days. PT denies any Chest pain. PT states blood pressure has been elevated and productive cough. Pt states chills x 2 days.

## 2023-12-19 NOTE — Progress Notes (Signed)
 Pt admitted to floor late this evening. Temp: 99.6; other VSS on 2L [place] upon arrival to floor. Pt weaned down to 1 L [place] and O2 Sat 99%.  Pt denies pain.  Pt placed on telemetry.  Skin check unable to be completed. Pt wants to be in chair for a bit and wants to keep pants on. Chest/abdomen/back skin intact with a few red bumps.  Cont. Fluids started.  Wife at bedside.

## 2023-12-19 NOTE — ED Provider Triage Note (Signed)
 Emergency Medicine Provider Triage Evaluation Note  Peter [name] , a 78 y.o. male  was evaluated in triage.  Pt complains of shortness of breath ongoing for the past 5 days or so.  States this is also accompanied with a cough and body aches.  He was seen at urgent care earlier today and told he had pneumonia and to come to the ER.  Reports a history of heart failure.  Denies any chest pain.  Review of Systems  Positive: As above Negative: As above  Physical Exam  BP (!) 136/116 (BP Location: Right Arm)   Pulse 82   Temp (!) 101.5 F (38.6 C)   Resp (!) 26   Wt 98 kg   SpO2 96%   BMI 32.85 kg/m  Gen:   Awake, no distress   Resp:  Increased effort, talks in shorter sentences MSK:   Moves extremities without difficulty   Medical Decision Making  Medically screening exam initiated at 11:21 AM.  Appropriate orders placed.  Peter [name] was informed that the remainder of the evaluation will be completed by another provider, this initial triage assessment does not replace that evaluation, and the importance of remaining in the ED until their evaluation is complete.     Veta Palma, PA-C 12/19/23 1123

## 2023-12-19 NOTE — Assessment & Plan Note (Addendum)
 SR on exam. Cont xarelto  .

## 2023-12-19 NOTE — Assessment & Plan Note (Addendum)
 Cont with tamiflu / droplet precaution.  PRN albuterol  and antitussives.

## 2023-12-20 ENCOUNTER — Encounter (HOSPITAL_COMMUNITY): Payer: Self-pay | Admitting: Internal Medicine

## 2023-12-20 DIAGNOSIS — J9601 Acute respiratory failure with hypoxia: Secondary | ICD-10-CM | POA: Diagnosis present

## 2023-12-20 DIAGNOSIS — J101 Influenza due to other identified influenza virus with other respiratory manifestations: Secondary | ICD-10-CM | POA: Diagnosis not present

## 2023-12-20 LAB — IRON AND TIBC
Iron: 58 ug/dL (ref 45–182)
Saturation Ratios: 17 % — ABNORMAL LOW (ref 17.9–39.5)
TIBC: 337 ug/dL (ref 250–450)
UIBC: 279 ug/dL

## 2023-12-20 LAB — RETICULOCYTES
Immature Retic Fract: 8.6 % (ref 2.3–15.9)
RBC.: 3.71 MIL/uL — ABNORMAL LOW (ref 4.22–5.81)
Retic Count, Absolute: 84.6 10*3/uL (ref 19.0–186.0)
Retic Ct Pct: 2.3 % (ref 0.4–3.1)

## 2023-12-20 LAB — FOLATE: Folate: 19.5 ng/mL (ref >5.9)

## 2023-12-20 LAB — VITAMIN B12: Vitamin B-12: 982 pg/mL — ABNORMAL HIGH (ref 180–914)

## 2023-12-20 LAB — FERRITIN: Ferritin: 99 ng/mL (ref 24–336)

## 2023-12-20 MED ORDER — LISINOPRIL 20 MG PO TABS
40.0000 mg | ORAL_TABLET | Freq: Every day | ORAL | Status: DC
  Administered 2023-12-20 – 2023-12-25 (×6): 40 mg via ORAL
  Filled 2023-12-20 (×6): qty 2

## 2023-12-20 MED ORDER — PANTOPRAZOLE SODIUM 40 MG PO TBEC
40.0000 mg | DELAYED_RELEASE_TABLET | Freq: Every day | ORAL | Status: DC
  Administered 2023-12-20 – 2023-12-25 (×6): 40 mg via ORAL
  Filled 2023-12-20 (×6): qty 1

## 2023-12-20 MED ORDER — PNEUMOCOCCAL 20-VAL CONJ VACC 0.5 ML IM SUSY
0.5000 mL | PREFILLED_SYRINGE | INTRAMUSCULAR | Status: AC
  Administered 2023-12-21: 0.5 mL via INTRAMUSCULAR
  Filled 2023-12-20: qty 0.5

## 2023-12-20 NOTE — Assessment & Plan Note (Signed)
    Latest Ref Rng & Units 12/19/2023   11:17 AM 07/30/2022    3:22 AM 07/17/2022    9:56 AM  CBC  WBC 4.0 - 10.5 K/uL 7.9  18.7  16.7   Hemoglobin 13.0 - 17.0 g/dL 87.3  87.8  86.5   Hematocrit 39.0 - 52.0 % 38.7  36.8  39.0   Platelets 150 - 400 K/uL 144  171  123   Currently hemoglobin is 12.6 will monitor repeat CBC in the morning. Anemia panel today, is normal and folate is pending. Patient is macrocytic and is on p.o. B12 replacement his B12 is normal. Continue with PPI therapy.

## 2023-12-20 NOTE — Plan of Care (Signed)
  Problem: Education: Goal: Knowledge of General Education information will improve Description: Including pain rating scale, medication(s)/side effects and non-pharmacologic comfort measures Outcome: Progressing   Problem: Health Behavior/Discharge Planning: Goal: Ability to manage health-related needs will improve Outcome: Progressing   Problem: Clinical Measurements: Goal: Ability to maintain clinical measurements within normal limits will improve Outcome: Progressing   Problem: Activity: Goal: Risk for activity intolerance will decrease Outcome: Progressing   Problem: Nutrition: Goal: Adequate nutrition will be maintained Outcome: Progressing   Problem: Elimination: Goal: Will not experience complications related to urinary retention Outcome: Progressing   Problem: Safety: Goal: Ability to remain free from injury will improve Outcome: Progressing

## 2023-12-20 NOTE — Assessment & Plan Note (Addendum)
 Secondary to influenza A Initially given Rocephin , however, chest x-ray does not show acute infiltrate and procalcitonin is not indicative of bacterial infection.  Will discontinue Rocephin  Add pulmonary toilet O2 as needed

## 2023-12-20 NOTE — Assessment & Plan Note (Signed)
 Stable no chest pain or anginal equivalent symptoms.  Continue Lipitor, Coreg , Apresoline , lisinopril , Xarelto 

## 2023-12-20 NOTE — Assessment & Plan Note (Signed)
 Lab Results  Component Value Date   CREATININE 1.35 (H) 12/19/2023   CREATININE 1.18 07/30/2022   CREATININE 1.32 (H) 07/09/2022  Creatinine appears at baseline. CMP in the a.m. Hold nephrotoxic agents

## 2023-12-20 NOTE — Assessment & Plan Note (Addendum)
 Pt does meet sepsis criteria with vitals and source.  Additional supportive measures as needed.  Vitals are stable and pt does not meet criteria for 30 cc/kg bolus due to his heart failure history and no hypotension  Resolved

## 2023-12-20 NOTE — Progress Notes (Signed)
 Physical Therapy Evaluation and Discharge  Patient Details Name: Peter [name] MRN: [id number] DOB: 1945-12-24 Today's Date: 12/20/2023  History of Present Illness  Pt is a 78 yr old male who presented to [facility] ED on 2/7 due to SOB, cough and fever. Positive for influenza A and pt met sepsis criteria. PMH: CAD, GERD, CKD, afib, and chronic systolic congestive heart failure.  Clinical Impression  Patient agreeable to participate with therapy today. The patient lives at home with his wife and son, works part-time at [employer]. Patient was independent for all transfers, demonstrating good technique for sit > stand. Once seated upright EOB, the patient's O2 desaturated to 88% on RA and then 91% on RA after pursed lip breathing. During ambulation, the patient walked 186ft and was Mod I using a IV pole for extra support. Under supervision, the patient was able to ascend /descend one step, descending > ascending more challenging due to recent R THA. Following stairs, the patient's O2 desaturated to 88% on RA then 91% after supplemental O2 was given. Following ambulation, the patient sat EOB and his O2% desaturated to 84% before being given supplemental O2 (increased to 91%). 1L of O2 was given when supplemental O2 was needed. Overall, the patient has met all expectations with acute care PT. Thank you for this consult.       If plan is discharge home, recommend the following: Help with stairs or ramp for entrance   Can travel by private vehicle        Equipment Recommendations None recommended by PT  Recommendations for Other Services       Functional Status Assessment Patient has had a recent decline in their functional status and demonstrates the ability to make significant improvements in function in a reasonable and predictable amount of time.     Precautions / Restrictions Precautions Precautions: Other (comment) (Monitor O2  Simultaneous filing. User may not have seen previous  data.) Restrictions Weight Bearing Restrictions Per Provider Order: No      Mobility  Bed Mobility Overal bed mobility: Independent                  Transfers Overall transfer level: Independent                      Ambulation/Gait Ambulation/Gait assistance: Modified independent (Device/Increase time) Gait Distance (Feet): 150 Feet Assistive device: IV Pole Gait Pattern/deviations: WFL(Within Functional Limits)       General Gait Details: Good cadence, no rest breaks required  Stairs Stairs: Yes Stairs assistance: Supervision Stair Management: No rails Number of Stairs: 1 General stair comments: Descending > Ascending was more challenging due to R THA in 2023  Wheelchair Mobility     Tilt Bed    Modified Rankin (Stroke Patients Only)       Balance Overall balance assessment: Independent                                           Pertinent Vitals/Pain Pain Assessment Pain Assessment: 0-10 Pain Score: 1  Pain Location: sore at ribs Pain Descriptors / Indicators: Aching Pain Intervention(s): Monitored during session    Home Living Family/patient expects to be discharged to:: Private residence Living Arrangements: Spouse/significant other Available Help at Discharge: Family Type of Home: House Home Access: Stairs to enter   [employer] of Steps: 1 onto deck  Home Layout: One level Home Equipment: Adaptive equipment      Prior Function Prior Level of Function : Independent/Modified Independent;Working/employed                     Extremity/Trunk Assessment   Upper Extremity Assessment Upper Extremity Assessment: Defer to OT evaluation    Lower Extremity Assessment Lower Extremity Assessment: Overall WFL for tasks assessed    Cervical / Trunk Assessment Cervical / Trunk Assessment: Normal  Communication   Communication Communication: No apparent difficulties  Cognition Arousal:  Alert Behavior During Therapy: WFL for tasks assessed/performed Overall Cognitive Status: Within Functional Limits for tasks assessed                                          General Comments General comments (skin integrity, edema, etc.): O2 Sat: PreMobility (91% on RA); during ambulation (88-91% on RA/supplemental O2); Post Mobility (84-91% on RA/supplemental O2)    Exercises     Assessment/Plan    PT Assessment Patient does not need any further PT services  PT Problem List         PT Treatment Interventions      PT Goals (Current goals can be found in the Care Plan section)  Acute Rehab PT Goals Patient Stated Goal: Return to PLOF PT Goal Formulation: With patient    Frequency       Co-evaluation               AM-PAC PT 6 Clicks Mobility  Outcome Measure Help needed turning from your back to your side while in a flat bed without using bedrails?: None Help needed moving from lying on your back to sitting on the side of a flat bed without using bedrails?: None Help needed moving to and from a bed to a chair (including a wheelchair)?: None Help needed standing up from a chair using your arms (e.g., wheelchair or bedside chair)?: None Help needed to walk in hospital room?: A Little Help needed climbing 3-5 steps with a railing? : A Little 6 Click Score: 22    End of Session Equipment Utilized During Treatment: Gait belt;Oxygen Activity Tolerance: Other (comment) (SOB on Exertion) Patient left: in bed;with call bell/phone within reach;with nursing/sitter in room Nurse Communication: Mobility status;Other (comment) (Vitals (O2%)) PT Visit Diagnosis: Difficulty in walking, not elsewhere classified (R26.2)    Time: 8585-8561 PT Time Calculation (min) (ACUTE ONLY): 24 min   Charges:   PT Evaluation $PT Eval Low Complexity: 1 Low PT Treatments $Therapeutic Activity: 8-22 mins PT General Charges $$ ACUTE PT VISIT: 1 Visit          Deanie DOROTHA Harms, SPT   Jonella Redditt 12/20/2023, 3:13 PM

## 2023-12-20 NOTE — Assessment & Plan Note (Signed)
 SR on exam. Cont xarelto  .

## 2023-12-20 NOTE — Assessment & Plan Note (Signed)
 Continue with PPI therapy

## 2023-12-20 NOTE — Evaluation (Signed)
 Occupational Therapy Evaluation Patient Details Name: Peter [name] MRN: [id number] DOB: 1946/05/20 Today's Date: 12/20/2023   History of Present Illness Pt is a 78 yr old male who presented to [facility] ED on 2/7 due to SOB, cough and fever. Positive for influenza A and pt met sepsis criteria. PMH: CAD, GERD, CKD, afib, and chronic systolic congestive heart failure.   Clinical Impression   Pt reported at PLOF they work part time at [employer] without DME/AE. He was able to complete UE dressing with mod Independent and LB dressing with mod independent. He was able to ambulate on RA and dropped to 86-87% but once seated and taking deep diagram breaths was able to bring back up to max 94%. He was educated on energy conservation methods at this time. Acute Occupational Therapy will follow at this time.        If plan is discharge home, recommend the following: Assistance with cooking/housework;Help with stairs or ramp for entrance    Functional Status Assessment  Patient has had a recent decline in their functional status and demonstrates the ability to make significant improvements in function in a reasonable and predictable amount of time.  Equipment Recommendations  Tub/shower seat    Recommendations for Other Services       Precautions / Restrictions Precautions Precautions: Fall Restrictions Weight Bearing Restrictions Per Provider Order: No      Mobility Bed Mobility Overal bed mobility: Independent                  Transfers Overall transfer level: Modified independent                 General transfer comment: increase in time      Balance Overall balance assessment: Modified Independent (Pt able to look up/down, side to side and picked up items off the floor with no LOB)                                         ADL either performed or assessed with clinical judgement   ADL Overall ADL's : Needs assistance/impaired Eating/Feeding:  Independent;Sitting   Grooming: Wash/dry hands;Wash/dry face;Modified independent;Sitting   Upper Body Bathing: Modified independent;Sitting   Lower Body Bathing: Modified independent;Sit to/from stand   Upper Body Dressing : Modified independent;Sitting   Lower Body Dressing: Modified independent;Sit to/from stand   Toilet Transfer: Modified Independent   Toileting- Clothing Manipulation and Hygiene: Modified independent;Sit to/from stand       Functional mobility during ADLs: Supervision/safety General ADL Comments: used  IV pole but does not require for balance     Vision Baseline Vision/History: 1 Wears glasses Ability to See in Adequate Light: 0 Adequate Patient Visual Report: No change from baseline Vision Assessment?: No apparent visual deficits     Perception Perception: Within Functional Limits       Praxis Praxis: WFL       Pertinent Vitals/Pain Pain Assessment Pain Assessment: 0-10 Pain Score: 1  Pain Location: sore at ribs Pain Descriptors / Indicators: Aching Pain Intervention(s): Limited activity within patient's tolerance, Monitored during session, Repositioned     Extremity/Trunk Assessment Upper Extremity Assessment Upper Extremity Assessment: Overall WFL for tasks assessed   Lower Extremity Assessment Lower Extremity Assessment: Defer to PT evaluation   Cervical / Trunk Assessment Cervical / Trunk Assessment: Normal   Communication Communication Communication: No apparent difficulties   Cognition Arousal: Alert  Behavior During Therapy: WFL for tasks assessed/performed Overall Cognitive Status: Within Functional Limits for tasks assessed                                       General Comments       Exercises     Shoulder Instructions      Home Living Family/patient expects to be discharged to:: Private residence Living Arrangements: Spouse/significant other Available Help at Discharge: Family Type of Home:  House Home Access: Stairs to enter [employer] of Steps: 1 onto deck   Home Layout: One level     Bathroom Shower/Tub: Tub/shower unit;Door   [employer] Toilet: Standard     Home Equipment: Scientist, Physiological Equipment: Sock aid;Reacher        Prior Functioning/Environment Prior Level of Function : Independent/Modified Independent;Working/employed (works part time at [employer])                        OT Problem List: Pain      OT Treatment/Interventions: Self-care/ADL training;Therapeutic activities;Patient/family education;Energy conservation    OT Goals(Current goals can be found in the care plan section) Acute Rehab OT Goals Patient Stated Goal: to get stronger OT Goal Formulation: With patient Time For Goal Achievement: 01/03/24 Potential to Achieve Goals: Good  OT Frequency: Min 1X/week    Co-evaluation              AM-PAC OT 6 Clicks Daily Activity     Outcome Measure Help from another person eating meals?: None Help from another person taking care of personal grooming?: None Help from another person toileting, which includes using toliet, bedpan, or urinal?: A Little Help from another person bathing (including washing, rinsing, drying)?: A Little Help from another person to put on and taking off regular upper body clothing?: None Help from another person to put on and taking off regular lower body clothing?: A Little 6 Click Score: 21   End of Session Equipment Utilized During Treatment: Gait belt Nurse Communication: Mobility status  Activity Tolerance: Patient tolerated treatment well Patient left: in bed;with call bell/phone within reach  OT Visit Diagnosis: Pain;Muscle weakness (generalized) (M62.81) Pain - part of body:  (ribs)                Time: 8962-8888 OT Time Calculation (min): 34 min Charges:  OT General Charges $OT Visit: 1 Visit OT Evaluation $OT Eval Low Complexity: 1 Low OT Treatments $Self Care/Home  Management : 8-22 mins  Warrick POUR OTR/L  Acute Rehab Services  [phone] office number   Warrick Berber 12/20/2023, 11:25 AM

## 2023-12-20 NOTE — Hospital Course (Signed)
 Peter [name] is a 78 y.o. male with past medical history  of hypertension, CAD, GERD, CKD stage III, IDA, A-fib on Xarelto , chronic systolic congestive heart failure, presenting with shortness of breath cough chills for about 5 days, no reports of chest pain palpitation nausea vomiting diarrhea headaches speech or gait issues no falls. Pt does endorse SOBOE.  No reports of lower extremity edema or dizziness.  Patient went to the urgent care where he had a chest x-ray and was told to come to the emergency room.  Patient tested positive for influenza A.  He met sepsis criteria due to vitals.  He had a new oxygen requirement requiring admission to the hospital.

## 2023-12-20 NOTE — Assessment & Plan Note (Signed)
 Vitals:   12/19/23 1107 12/19/23 1352 12/19/23 1533 12/19/23 1636  BP: (!) 136/116 (!) 118/53 139/87 (!) 148/83   12/19/23 1800 12/19/23 2020 12/20/23 0429 12/20/23 0749  BP: (!) 133/96 138/83 (!) 142/79 127/75   Continue Coreg , lisinopril , Apresoline 

## 2023-12-20 NOTE — Progress Notes (Signed)
 Progress Note   Patient: Peter [name] DOB: September 14, 1946 DOA: 12/19/2023     1 DOS: the patient was seen and examined on 12/20/2023   Brief hospital course: Peter [name] is a 78 y.o. male with past medical history  of hypertension, CAD, GERD, CKD stage III, IDA, A-fib on Xarelto , chronic systolic congestive heart failure, presenting with shortness of breath cough chills for about 5 days, no reports of chest pain palpitation nausea vomiting diarrhea headaches speech or gait issues no falls. Pt does endorse SOBOE.  No reports of lower extremity edema or dizziness.  Patient went to the urgent care where he had a chest x-ray and was told to come to the emergency room.  Patient tested positive for influenza A.  He met sepsis criteria due to vitals.  He had a new oxygen requirement requiring admission to the hospital.  Assessment and Plan: * Influenza A Cont with tamiflu / droplet precaution.  PRN albuterol  and antitussives.    Sepsis (HCC) Pt does meet sepsis criteria with vitals and source.  Additional supportive measures as needed.  Vitals are stable and pt does not meet criteria for 30 cc/kg bolus due to his heart failure history and no hypotension    Coronary artery disease due to lipid rich plaque Stable no chest pain or anginal equivalent symptoms.  Continue Lipitor, Coreg , Apresoline , lisinopril , Xarelto    AF (atrial fibrillation) (HCC) SR on exam. Cont xarelto  .   Hypertension Vitals:   12/19/23 1107 12/19/23 1352 12/19/23 1533 12/19/23 1636  BP: (!) 136/116 (!) 118/53 139/87 (!) 148/83   12/19/23 1800 12/19/23 2020 12/20/23 0429 12/20/23 0749  BP: (!) 133/96 138/83 (!) 142/79 127/75   Continue Coreg , lisinopril , Apresoline    Anemia    Latest Ref Rng & Units 12/19/2023   11:17 AM 07/30/2022    3:22 AM 07/17/2022    9:56 AM  CBC  WBC 4.0 - 10.5 K/uL 7.9  18.7  16.7   Hemoglobin 13.0 - 17.0 g/dL 87.3  87.8  86.5   Hematocrit 39.0 - 52.0 % 38.7  36.8  39.0    Platelets 150 - 400 K/uL 144  171  123   Currently hemoglobin is 12.6 will monitor repeat CBC in the morning. Anemia panel today, is normal and folate is pending. Patient is macrocytic and is on p.o. B12 replacement his B12 is normal. Continue with PPI therapy.   CKD (chronic kidney disease), stage III (HCC) Lab Results  Component Value Date   CREATININE 1.35 (H) 12/19/2023   CREATININE 1.18 07/30/2022   CREATININE 1.32 (H) 07/09/2022  Creatinine appears at baseline. CMP in the a.m. Hold nephrotoxic agents    GERD (gastroesophageal reflux disease) Continue with PPI therapy.  Acute respiratory failure with hypoxia (HCC) Secondary to influenza A Initially given Rocephin , however, chest x-ray does not show acute infiltrate and procalcitonin is not indicative of bacterial infection.  Will discontinue Rocephin  Add pulmonary toilet O2 as needed    Subjective: Patient reports feeling somewhat better today.  He is concerned that his ACE inhibitor was held he would like this resumed.  Physical Exam: Vitals:   12/19/23 1802 12/19/23 2020 12/20/23 0429 12/20/23 0749  BP:  138/83 (!) 142/79 127/75  Pulse:  77 66 71  Resp:  18 17 16   Temp:  98.5 F (36.9 C) 99 F (37.2 C) 99 F (37.2 C)  TempSrc:  Oral Oral Oral  SpO2: 98% 94% 98% 94%  Weight:  Physical Examination: General appearance - alert, well appearing, and in no distress Chest - clear to auscultation, no wheezes, rales or rhonchi, symmetric air entry Heart - normal rate, regular rhythm, normal S1, S2, no murmurs, rubs, clicks or gallops Abdomen - soft, nontender, nondistended, no masses or organomegaly Skin - normal coloration and turgor, no rashes, no suspicious skin lesions noted  Data Reviewed: Results for orders placed or performed during the hospital encounter of 12/19/23 (from the past 24 hours)  Troponin I (High Sensitivity)     Status: None   Collection Time: 12/19/23  1:05 PM  Result Value Ref Range    Troponin I (High Sensitivity) 13 <18 ng/L  Procalcitonin     Status: None   Collection Time: 12/19/23  1:05 PM  Result Value Ref Range   Procalcitonin <0.10 ng/mL  Blood culture (routine x 2)     Status: None (Preliminary result)   Collection Time: 12/19/23  2:04 PM   Specimen: BLOOD  Result Value Ref Range   Specimen Description BLOOD LEFT ANTECUBITAL    Special Requests      BOTTLES DRAWN AEROBIC AND ANAEROBIC Blood Culture adequate volume   Culture      NO GROWTH < 24 HOURS Performed at [facility], [address] [place], [place] [address]    Report Status PENDING   Blood culture (routine x 2)     Status: None (Preliminary result)   Collection Time: 12/19/23  2:35 PM   Specimen: BLOOD RIGHT HAND  Result Value Ref Range   Specimen Description BLOOD RIGHT HAND    Special Requests      BOTTLES DRAWN AEROBIC AND ANAEROBIC Blood Culture results may not be optimal due to an inadequate volume of blood received in culture bottles   Culture      NO GROWTH < 24 HOURS Performed at [facility], [address], [place], [place] [address]    Report Status PENDING   Hepatic function panel     Status: Abnormal   Collection Time: 12/19/23  3:20 PM  Result Value Ref Range   Total Protein 6.7 6.5 - 8.1 g/dL   Albumin 3.4 (L) 3.5 - 5.0 g/dL   AST 35 15 - 41 U/L   ALT 18 0 - 44 U/L   Alkaline Phosphatase 83 38 - 126 U/L   Total Bilirubin 0.7 0.0 - 1.2 mg/dL   Bilirubin, Direct 0.2 0.0 - 0.2 mg/dL   Indirect Bilirubin 0.5 0.3 - 0.9 mg/dL  Vitamin B12     Status: Abnormal   Collection Time: 12/20/23  9:42 AM  Result Value Ref Range   Vitamin B-12 982 (H) 180 - 914 pg/mL  Iron and TIBC     Status: Abnormal   Collection Time: 12/20/23  9:42 AM  Result Value Ref Range   Iron 58 45 - 182 ug/dL   TIBC 662 749 - 549 ug/dL   Saturation Ratios 17 (L) 17.9 - 39.5 %   UIBC 279 ug/dL  Ferritin     Status: None   Collection Time: 12/20/23  9:42 AM  Result Value Ref Range    Ferritin 99 24 - 336 ng/mL  Reticulocytes     Status: Abnormal   Collection Time: 12/20/23  9:42 AM  Result Value Ref Range   Retic Ct Pct 2.3 0.4 - 3.1 %   RBC. 3.71 (L) 4.22 - 5.81 MIL/uL   Retic Count, Absolute 84.6 19.0 - 186.0 K/uL   Immature  Retic Fract 8.6 2.3 - 15.9 %     Family Communication: Patient at bedside  Disposition: Status is: Inpatient Remains inpatient appropriate because: Ongoing respiratory failure with hypoxia  Planned Discharge Destination: Home DVT prophylaxis: On Xarelto   Time spent: 38 minutes  Author: Glenys GORMAN Birk, MD 12/20/2023 11:31 AM  For on call review [email or web address].

## 2023-12-20 NOTE — Assessment & Plan Note (Addendum)
 Cont with tamiflu / droplet precaution.  PRN albuterol  and antitussives. The patient continues to feel improvement in his symptoms. The patient continues to have an oxygen requirement.

## 2023-12-21 DIAGNOSIS — J101 Influenza due to other identified influenza virus with other respiratory manifestations: Secondary | ICD-10-CM | POA: Diagnosis not present

## 2023-12-21 LAB — COMPREHENSIVE METABOLIC PANEL
ALT: 18 U/L (ref 0–44)
AST: 35 U/L (ref 15–41)
Albumin: 3.4 g/dL — ABNORMAL LOW (ref 3.5–5.0)
Alkaline Phosphatase: 82 U/L (ref 38–126)
Anion gap: 14 (ref 5–15)
BUN: 27 mg/dL — ABNORMAL HIGH (ref 8–23)
CO2: 23 mmol/L (ref 22–32)
Calcium: 9 mg/dL (ref 8.9–10.3)
Chloride: 102 mmol/L (ref 98–111)
Creatinine, Ser: 1.34 mg/dL — ABNORMAL HIGH (ref 0.61–1.24)
GFR, Estimated: 54 mL/min — ABNORMAL LOW (ref >60)
Glucose, Bld: 100 mg/dL — ABNORMAL HIGH (ref 70–99)
Potassium: 3.8 mmol/L (ref 3.5–5.1)
Sodium: 139 mmol/L (ref 135–145)
Total Bilirubin: 0.6 mg/dL (ref 0.0–1.2)
Total Protein: 6.8 g/dL (ref 6.5–8.1)

## 2023-12-21 LAB — CBC
HCT: 36.8 % — ABNORMAL LOW (ref 39.0–52.0)
Hemoglobin: 11.8 g/dL — ABNORMAL LOW (ref 13.0–17.0)
MCH: 33 pg (ref 26.0–34.0)
MCHC: 32.1 g/dL (ref 30.0–36.0)
MCV: 102.8 fL — ABNORMAL HIGH (ref 80.0–100.0)
Platelets: 148 10*3/uL — ABNORMAL LOW (ref 150–400)
RBC: 3.58 MIL/uL — ABNORMAL LOW (ref 4.22–5.81)
RDW: 14.3 % (ref 11.5–15.5)
WBC: 5.5 10*3/uL (ref 4.0–10.5)
nRBC: 0 % (ref 0.0–0.2)

## 2023-12-21 MED ORDER — ALLOPURINOL 300 MG PO TABS
300.0000 mg | ORAL_TABLET | Freq: Every morning | ORAL | Status: DC
  Administered 2023-12-22 – 2023-12-25 (×4): 300 mg via ORAL
  Filled 2023-12-21 (×5): qty 1

## 2023-12-21 MED ORDER — ALLOPURINOL 100 MG PO TABS
100.0000 mg | ORAL_TABLET | Freq: Every day | ORAL | Status: DC
  Administered 2023-12-21 – 2023-12-24 (×4): 100 mg via ORAL
  Filled 2023-12-21 (×4): qty 1

## 2023-12-21 MED ORDER — DIPHENHYDRAMINE HCL 25 MG PO CAPS
25.0000 mg | ORAL_CAPSULE | Freq: Once | ORAL | Status: AC | PRN
  Administered 2023-12-22: 25 mg via ORAL
  Filled 2023-12-21: qty 1

## 2023-12-21 MED ORDER — FUROSEMIDE 40 MG PO TABS
40.0000 mg | ORAL_TABLET | Freq: Every day | ORAL | Status: DC
  Administered 2023-12-21 – 2023-12-25 (×5): 40 mg via ORAL
  Filled 2023-12-21 (×5): qty 1

## 2023-12-21 MED ORDER — CARVEDILOL 25 MG PO TABS
25.0000 mg | ORAL_TABLET | Freq: Two times a day (BID) | ORAL | Status: DC
  Administered 2023-12-21 – 2023-12-25 (×8): 25 mg via ORAL
  Filled 2023-12-21 (×8): qty 1

## 2023-12-21 NOTE — Progress Notes (Signed)
 Progress Note   Patient: Peter [name] FMW:[id number] DOB: 12/09/1945 DOA: 12/19/2023     2 DOS: the patient was seen and examined on 12/21/2023   Brief hospital course: Peter [name] is a 78 y.o. male with past medical history  of hypertension, CAD, GERD, CKD stage III, IDA, A-fib on Xarelto , chronic systolic congestive heart failure, presenting with shortness of breath cough chills for about 5 days, no reports of chest pain palpitation nausea vomiting diarrhea headaches speech or gait issues no falls. Pt does endorse SOBOE.  No reports of lower extremity edema or dizziness.  Patient went to the urgent care where he had a chest x-ray and was told to come to the emergency room.  Patient tested positive for influenza A.  He met sepsis criteria due to vitals.  He had a new oxygen requirement requiring admission to the hospital.  Assessment and Plan: * Influenza A Cont with tamiflu / droplet precaution.  PRN albuterol  and antitussives. The patient continues to feel improvement in his symptoms. The patient continues to have an oxygen requirement.   Sepsis (HCC) Pt does meet sepsis criteria with vitals and source.  Additional supportive measures as needed.  Vitals are stable and pt does not meet criteria for 30 cc/kg bolus due to his heart failure history and no hypotension  Resolved    Coronary artery disease due to lipid rich plaque Stable no chest pain or anginal equivalent symptoms.  Continue Lipitor, Coreg , Apresoline , lisinopril , Xarelto    AF (atrial fibrillation) (HCC) SR on exam. Cont xarelto  .   Hypertension Vitals:   12/19/23 1107 12/19/23 1352 12/19/23 1533 12/19/23 1636  BP: (!) 136/116 (!) 118/53 139/87 (!) 148/83   12/19/23 1800 12/19/23 2020 12/20/23 0429 12/20/23 0749  BP: (!) 133/96 138/83 (!) 142/79 127/75   Continue Coreg , lisinopril , Apresoline    Anemia    Latest Ref Rng & Units 12/19/2023   11:17 AM 07/30/2022    3:22 AM 07/17/2022    9:56 AM  CBC  WBC  4.0 - 10.5 K/uL 7.9  18.7  16.7   Hemoglobin 13.0 - 17.0 g/dL 87.3  87.8  86.5   Hematocrit 39.0 - 52.0 % 38.7  36.8  39.0   Platelets 150 - 400 K/uL 144  171  123   Currently hemoglobin is 12.6 will monitor repeat CBC in the morning. Anemia panel today, is normal and folate is pending. Patient is macrocytic and is on p.o. B12 replacement his B12 is normal. Continue with PPI therapy.   CKD (chronic kidney disease), stage III (HCC) Lab Results  Component Value Date   CREATININE 1.35 (H) 12/19/2023   CREATININE 1.18 07/30/2022   CREATININE 1.32 (H) 07/09/2022  Creatinine appears at baseline. CMP in the a.m. Hold nephrotoxic agents    GERD (gastroesophageal reflux disease) Continue with PPI therapy.  Acute respiratory failure with hypoxia (HCC) Secondary to influenza A Initially given Rocephin , however, chest x-ray does not show acute infiltrate and procalcitonin is not indicative of bacterial infection.  Will discontinue Rocephin  Add pulmonary toilet O2 as needed    Subjective: Patient reports feeling better today.  He continues to need oxygen and has dyspnea on exertion.  Physical Exam: Vitals:   12/20/23 2103 12/21/23 0442 12/21/23 0747 12/21/23 0839  BP: 131/73 130/73 131/78   Pulse: 77 65 63   Resp: 18 18 18    Temp: 98 F (36.7 C) 98 F (36.7 C) 98.2 F (36.8 C)   TempSrc: Oral Oral    SpO2:  94% 94% 96% 98%  Weight:       Physical Examination: General appearance - ill-appearing Chest -normal respiratory rate, wheezing heard diffusely today Heart - normal rate, regular rhythm, normal S1, S2, no murmurs, rubs, clicks or gallops Abdomen - soft, nontender, nondistended, no masses or organomegaly Extremities - no pedal edema noted  Data Reviewed: Results for orders placed or performed during the hospital encounter of 12/19/23 (from the past 24 hours)  CBC     Status: Abnormal   Collection Time: 12/21/23  5:14 AM  Result Value Ref Range   WBC 5.5 4.0 - 10.5  K/uL   RBC 3.58 (L) 4.22 - 5.81 MIL/uL   Hemoglobin 11.8 (L) 13.0 - 17.0 g/dL   HCT 63.1 (L) 60.9 - 47.9 %   MCV 102.8 (H) 80.0 - 100.0 fL   MCH 33.0 26.0 - 34.0 pg   MCHC 32.1 30.0 - 36.0 g/dL   RDW 85.6 88.4 - 84.4 %   Platelets 148 (L) 150 - 400 K/uL   nRBC 0.0 0.0 - 0.2 %  Comprehensive metabolic panel     Status: Abnormal   Collection Time: 12/21/23  5:14 AM  Result Value Ref Range   Sodium 139 135 - 145 mmol/L   Potassium 3.8 3.5 - 5.1 mmol/L   Chloride 102 98 - 111 mmol/L   CO2 23 22 - 32 mmol/L   Glucose, Bld 100 (H) 70 - 99 mg/dL   BUN 27 (H) 8 - 23 mg/dL   Creatinine, Ser 8.65 (H) 0.61 - 1.24 mg/dL   Calcium  9.0 8.9 - 10.3 mg/dL   Total Protein 6.8 6.5 - 8.1 g/dL   Albumin 3.4 (L) 3.5 - 5.0 g/dL   AST 35 15 - 41 U/L   ALT 18 0 - 44 U/L   Alkaline Phosphatase 82 38 - 126 U/L   Total Bilirubin 0.6 0.0 - 1.2 mg/dL   GFR, Estimated 54 (L) >60 mL/min   Anion gap 14 5 - 15     Family Communication: Patient at bedside  Disposition: Status is: Inpatient Remains inpatient appropriate because: Ongoing respiratory failure with hypoxia and need for oxygen supplementation.  Planned Discharge Destination: Home DVT prophylaxis: On Xarelto  Time spent: 41 minutes  Author: Glenys GORMAN Birk, MD 12/21/2023 10:05 AM  For on call review [email or web address].

## 2023-12-21 NOTE — Plan of Care (Signed)

## 2023-12-21 NOTE — Plan of Care (Signed)
  Problem: Education: Goal: Knowledge of General Education information will improve Description: Including pain rating scale, medication(s)/side effects and non-pharmacologic comfort measures Outcome: Progressing   Problem: Activity: Goal: Risk for activity intolerance will decrease Outcome: Progressing   Problem: Respiratory: Goal: Ability to maintain a clear airway will improve Outcome: Progressing

## 2023-12-21 NOTE — Progress Notes (Signed)
 Pt states he takes tylenol  pm every night which assists him to be able to get back to sleep once he wakes up to go to the bathroom during the night. Here only tylenol  ordered. Paged through [employer]ondie x 2 to see if can add the benadryl  part to the tylenol .

## 2023-12-22 ENCOUNTER — Inpatient Hospital Stay (HOSPITAL_COMMUNITY): Payer: Medicare HMO

## 2023-12-22 DIAGNOSIS — J101 Influenza due to other identified influenza virus with other respiratory manifestations: Secondary | ICD-10-CM | POA: Diagnosis not present

## 2023-12-22 MED ORDER — DM-GUAIFENESIN ER 30-600 MG PO TB12
1.0000 | ORAL_TABLET | Freq: Two times a day (BID) | ORAL | Status: DC
  Administered 2023-12-22 – 2023-12-25 (×7): 1 via ORAL
  Filled 2023-12-22 (×7): qty 1

## 2023-12-22 MED ORDER — FUROSEMIDE 10 MG/ML IJ SOLN
40.0000 mg | Freq: Once | INTRAMUSCULAR | Status: DC
  Filled 2023-12-22: qty 4

## 2023-12-22 MED ORDER — DIPHENHYDRAMINE HCL 25 MG PO CAPS
25.0000 mg | ORAL_CAPSULE | Freq: Once | ORAL | Status: AC | PRN
  Administered 2023-12-22: 25 mg via ORAL
  Filled 2023-12-22: qty 1

## 2023-12-22 MED ORDER — PREDNISONE 20 MG PO TABS
20.0000 mg | ORAL_TABLET | Freq: Every day | ORAL | Status: AC
Start: 2023-12-22 — End: 2023-12-25
  Administered 2023-12-22 – 2023-12-24 (×3): 20 mg via ORAL
  Filled 2023-12-22 (×3): qty 1

## 2023-12-22 NOTE — TOC Initial Note (Signed)
 Transition of Care (TOC) - Initial/Assessment Note   Spoke to patient at bedside. From home with wife. Has PCP .   PT recommending shower seat. Patient already has shower seat at home and CPAP .     Transition of Care Department [facility]) has reviewed patient and no TOC needs have been identified at this time. We will continue to monitor patient advancement through interdisciplinary progression rounds. If new patient transition needs arise, please place a TOC consult.   Patient Details  Name: Peter [name] MRN: [id number] Date of Birth: Apr 04, 1946  Transition of Care [facility]) CM/SW Contact:    Terre Ferri, RN Phone Number: 12/22/2023, 11:07 AM  Clinical Narrative:                   Expected Discharge Plan: Home/Self Care Barriers to Discharge: Continued Medical Work up   Patient Goals and CMS Choice Patient states their goals for this hospitalization and ongoing recovery are:: to return to home   Choice offered to / list presented to : NA      Expected Discharge Plan and Services In-house Referral: NA Discharge Planning Services: CM Consult Post Acute Care Choice: NA Living arrangements for the past 2 months: Single Family Home                 DME Arranged: N/A DME Agency: NA       HH Arranged: NA HH Agency: NA        Prior Living Arrangements/Services Living arrangements for the past 2 months: Single Family Home Lives with:: Spouse Patient language and need for interpreter reviewed:: Yes Do you feel safe going back to the place where you live?: Yes      Need for Family Participation in Patient Care: Yes (Comment) Care giver support system in place?: Yes (comment) Current home services: DME Criminal Activity/Legal Involvement Pertinent to Current Situation/Hospitalization: No - Comment as needed  Activities of Daily Living   ADL Screening (condition at time of admission) Independently performs ADLs?: Yes (appropriate for developmental age) Is the  patient deaf or have difficulty hearing?: No Does the patient have difficulty seeing, even when wearing glasses/contacts?: No Does the patient have difficulty concentrating, remembering, or making decisions?: No  Permission Sought/Granted   Permission granted to share information with : No              Emotional Assessment Appearance:: Appears stated age Attitude/Demeanor/Rapport: Engaged Affect (typically observed): Appropriate Orientation: : Oriented to Self, Oriented to Place, Oriented to  Time, Oriented to Situation Alcohol  / Substance Use: Not Applicable Psych Involvement: No (comment)  Admission diagnosis:  SOB (shortness of breath) [R06.02] Influenza A [J10.1] Patient Active Problem List   Diagnosis Date Noted   Acute respiratory failure with hypoxia (HCC) 12/20/2023   Influenza A 12/19/2023   Sepsis (HCC) 12/19/2023   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   Degenerative joint disease (DJD) of hip 07/16/2022   Arthritis of carpometacarpal (CMC) joint of right thumb 04/16/2022   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   Chronic right shoulder pain 03/07/2022   Pain of right hand 03/07/2022   Diverticulitis 02/28/2018   RLQ abdominal pain    Sigmoid diverticulitis    Acute blood loss anemia 11/13/2017   Anticoagulation adequate 11/12/2016   Epistaxis, recurrent 11/12/2016   AF (atrial fibrillation) (HCC) 10/24/2016   Obesity 08/09/2016   Atrial fibrillation with RVR (HCC) 08/03/2016   New onset a-fib (HCC) 07/16/2016   Persistent  atrial fibrillation (HCC) 07/16/2016   Acute systolic (congestive) heart failure (HCC)    Ejection fraction    Iron deficiency anemia due to chronic blood loss 02/04/2012   Anemia 02/03/2012   CKD (chronic kidney disease), stage III (HCC) 02/03/2012   DOE (dyspnea on exertion) 01/28/2012   Hypertension    Coronary artery disease due to lipid rich plaque    Dyslipidemia    GERD (gastroesophageal reflux disease)     PCP:  Lanae Pinal, MD Pharmacy:   [facility] - [address] [place] - [address] [place] [address] Phone: [phone] Fax: [phone]  [id number] - [place], [place] - [address] AT [facility] [address] [place] [address] Phone: [phone] Fax: [phone]     Social Drivers of Health (SDOH) Social History: SDOH Screenings   Food Insecurity: No Food Insecurity (12/20/2023)  Housing: Low Risk  (12/20/2023)  Transportation Needs: No Transportation Needs (12/20/2023)  Utilities: Not At Risk (12/20/2023)  Social Connections: Moderately Integrated (12/20/2023)  Tobacco Use: Low Risk  (06/10/2023)   SDOH Interventions:     Readmission Risk Interventions     No data to display

## 2023-12-22 NOTE — Progress Notes (Signed)
 PROGRESS NOTE    Peter [name]  WGN:[id number] DOB: 1946-06-10 DOA: 12/19/2023 PCP: Lanae Pinal, MD    Brief Narrative:  78 year old with history of hypertension, coronary artery disease, GERD, CKD stage IIIa, chronic A-fib on Xarelto , chronic systolic heart failure presented with about 5 days of shortness of breath and wheezing.  He was found to have influenza A infection.  He is admitted due to significant symptoms.  Subjective: Patient seen and examined.  Remains afebrile.  He is still significantly wheezing and has dyspnea on minimal mobility.  Remains on 2 L of oxygen.  Denies any nausea vomiting or chest pain. Assessment & Plan:   Acute wheezy bronchitis secondary to influenza A infection: Sepsis secondary to complains of a infection. Patient will need ongoing treatment due to being persistently symptomatic.   Currently on Tamiflu  and albuterol .   Will start patient on prednisone  20 mg daily for 5 days, on a scheduled mucolytic's.   Chest patient therapy, incentive and flutter valve.   Supplemental oxygen to keep saturations more than 90%. Mobilize in the hallway. Repeat chest x-ray today shows hazy opacities, will try 1 dose of IV Lasix  in addition to oral Lasix  that he received in the morning.  If persistently short of breath, will repeat echocardiogram.  Chronic combined heart failure: Reportedly euvolemic.  Latest echocardiogram from 2018 with normal ejection fraction.  If persistently symptomatic with shortness of breath, will repeat echocardiogram tomorrow.  Chronic medical issues including GERD, on PPI CKD stage IIIa, creatinine is at about baseline. Anemia of chronic disease, fairly stable. Chronic A-fib on Xarelto , currently sinus rhythm.  Therapeutic on Xarelto . Coronary artery disease, on Lipitor, Coreg , hydralazine , lisinopril .     DVT prophylaxis:  rivaroxaban  (XARELTO ) tablet 20 mg   Code Status: Full code Family Communication: None at the  bedside Disposition Plan: Status is: Inpatient Remains inpatient appropriate because: Persistently symptomatic     Consultants:  None  Procedures:  None  Antimicrobials:  Tamiflu      Objective: Vitals:   12/21/23 2056 12/22/23 0446 12/22/23 0733 12/22/23 0911  BP: 110/66 118/74 128/89   Pulse: 69 (!) 57 63 70  Resp: 18 18 16 17   Temp: 98.6 F (37 C) 97.8 F (36.6 C) 97.7 F (36.5 C)   TempSrc: Oral Oral Oral   SpO2: 92% 97% 97% 95%  Weight:        Intake/Output Summary (Last 24 hours) at 12/22/2023 1423 Last data filed at 12/21/2023 2300 Gross per 24 hour  Intake 400 ml  Output --  Net 400 ml   Filed Weights   12/19/23 1111  Weight: 98 kg    Examination:  General exam: Appears calm and comfortable at rest.  Dyspneic on mobility. Respiratory system: Clear to auscultation. Respiratory effort normal.  Some conducted upper airway sounds.  Inspiratory expiratory wheezes. Cardiovascular system: S1 & S2 heard, RRR. No pedal edema. Gastrointestinal system: Abdomen is nondistended, soft and nontender. No organomegaly or masses felt. Normal bowel sounds heard. Central nervous system: Alert and oriented. No focal neurological deficits. Extremities: Symmetric 5 x 5 power. Skin: No rashes, lesions or ulcers Psychiatry: Judgement and insight appear normal. Mood & affect appropriate.     Data Reviewed: I have personally reviewed following labs and imaging studies  CBC: Recent Labs  Lab 12/19/23 1117 12/21/23 0514  WBC 7.9 5.5  NEUTROABS 6.8  --   HGB 12.6* 11.8*  HCT 38.7* 36.8*  MCV 103.8* 102.8*  PLT 144* 148*   Basic Metabolic  Panel: Recent Labs  Lab 12/19/23 1117 12/21/23 0514  NA 139 139  K 4.1 3.8  CL 103 102  CO2 24 23  GLUCOSE 109* 100*  BUN 18 27*  CREATININE 1.35* 1.34*  CALCIUM  9.1 9.0   GFR: Estimated Creatinine Clearance: 51.5 mL/min (A) (by C-G formula based on SCr of 1.34 mg/dL (H)). Liver Function Tests: Recent Labs  Lab  12/19/23 1520 12/21/23 0514  AST 35 35  ALT 18 18  ALKPHOS 83 82  BILITOT 0.7 0.6  PROT 6.7 6.8  ALBUMIN 3.4* 3.4*   No results for input(s): "LIPASE", "AMYLASE" in the last 168 hours. No results for input(s): "AMMONIA" in the last 168 hours. Coagulation Profile: No results for input(s): "INR", "PROTIME" in the last 168 hours. Cardiac Enzymes: No results for input(s): "CKTOTAL", "CKMB", "CKMBINDEX", "TROPONINI" in the last 168 hours. BNP (last 3 results) No results for input(s): "PROBNP" in the last 8760 hours. HbA1C: No results for input(s): "HGBA1C" in the last 72 hours. CBG: No results for input(s): "GLUCAP" in the last 168 hours. Lipid Profile: No results for input(s): "CHOL", "HDL", "LDLCALC", "TRIG", "CHOLHDL", "LDLDIRECT" in the last 72 hours. Thyroid  Function Tests: No results for input(s): "TSH", "T4TOTAL", "FREET4", "T3FREE", "THYROIDAB" in the last 72 hours. Anemia Panel: Recent Labs    12/20/23 0942  VITAMINB12 982*  FOLATE 19.5  FERRITIN 99  TIBC 337  IRON 58  RETICCTPCT 2.3   Sepsis Labs: Recent Labs  Lab 12/19/23 1305  PROCALCITON <0.10    Recent Results (from the past 240 hours)  Resp panel by RT-PCR (RSV, Flu A&B, Covid) Anterior Nasal Swab     Status: Abnormal   Collection Time: 12/19/23 11:17 AM   Specimen: Anterior Nasal Swab  Result Value Ref Range Status   SARS Coronavirus 2 by RT PCR NEGATIVE NEGATIVE Final   Influenza A by PCR POSITIVE (A) NEGATIVE Final   Influenza B by PCR NEGATIVE NEGATIVE Final    Comment: (NOTE) The Xpert Xpress SARS-CoV-2/FLU/RSV plus assay is intended as an aid in the diagnosis of influenza from Nasopharyngeal swab specimens and should not be used as a sole basis for treatment. Nasal washings and aspirates are unacceptable for Xpert Xpress SARS-CoV-2/FLU/RSV testing.  Fact Sheet for Patients: BloggerCourse.com  Fact Sheet for Healthcare  Providers: SeriousBroker.it  This test is not yet approved or cleared by the [place]  FDA and has been authorized for detection and/or diagnosis of SARS-CoV-2 by FDA under an Emergency Use Authorization (EUA). This EUA will remain in effect (meaning this test can be used) for the duration of the COVID-19 declaration under Section 564(b)(1) of the Act, 21 U.S.C. section 360bbb-3(b)(1), unless the authorization is terminated or revoked.     Resp Syncytial Virus by PCR NEGATIVE NEGATIVE Final    Comment: (NOTE) Fact Sheet for Patients: BloggerCourse.com  Fact Sheet for Healthcare Providers: SeriousBroker.it  This test is not yet approved or cleared by the [place]  FDA and has been authorized for detection and/or diagnosis of SARS-CoV-2 by FDA under an Emergency Use Authorization (EUA). This EUA will remain in effect (meaning this test can be used) for the duration of the COVID-19 declaration under Section 564(b)(1) of the Act, 21 U.S.C. section 360bbb-3(b)(1), unless the authorization is terminated or revoked.  Performed at [facility], [address], [place], [place] [address]   Blood culture (routine x 2)     Status: None (Preliminary result)   Collection Time: 12/19/23  2:04 PM   Specimen:  BLOOD  Result Value Ref Range Status   Specimen Description BLOOD LEFT ANTECUBITAL  Final   Special Requests   Final    BOTTLES DRAWN AEROBIC AND ANAEROBIC Blood Culture adequate volume   Culture   Final    NO GROWTH 2 DAYS Performed at [facility], [address] [place], [place] [address]    Report Status PENDING  Incomplete  Blood culture (routine x 2)     Status: None (Preliminary result)   Collection Time: 12/19/23  2:35 PM   Specimen: BLOOD RIGHT HAND  Result Value Ref Range Status   Specimen Description BLOOD RIGHT HAND  Final   Special Requests   Final    BOTTLES DRAWN AEROBIC  AND ANAEROBIC Blood Culture results may not be optimal due to an inadequate volume of blood received in culture bottles   Culture   Final    NO GROWTH 2 DAYS Performed at [facility], [address] [place], [place] [address]    Report Status PENDING  Incomplete         Radiology Studies: No results found.      Scheduled Meds:  allopurinol   100 mg Oral QHS   And   allopurinol   300 mg Oral q AM   atorvastatin   40 mg Oral Daily   budesonide  (PULMICORT ) nebulizer solution  0.25 mg Nebulization BID   carvedilol   25 mg Oral BID WC   dextromethorphan -guaiFENesin   1 tablet Oral BID   furosemide   40 mg Oral Daily   lisinopril   40 mg Oral Daily   oseltamivir   30 mg Oral BID   pantoprazole   40 mg Oral Daily   predniSONE   20 mg Oral Q breakfast   rivaroxaban   20 mg Oral QHS   sertraline   25 mg Oral Daily   Continuous Infusions:   LOS: 3 days    Time spent: 40 minutes    Vada Garibaldi, MD [facility]

## 2023-12-22 NOTE — Progress Notes (Addendum)
 Mobility Specialist Progress Note:   12/22/23 1608  Mobility  Activity Ambulated with assistance in hallway  Level of Assistance Standby assist, set-up cues, supervision of patient - no hands on  Assistive Device None  Distance Ambulated (ft) 450 ft  Activity Response Tolerated well  Mobility Referral Yes  Mobility visit 1 Mobility  Mobility Specialist Start Time (ACUTE ONLY) 1545  Mobility Specialist Stop Time (ACUTE ONLY) 1600  Mobility Specialist Time Calculation (min) (ACUTE ONLY) 15 min   Pre Mobility:  99% SpO2 RA During Mobility: 94 HR , 85%-98% SpO2 RA - 1 L  Pt received in bed, agreeable to mobility. Desat to 85% on RA but with pursed lip breathing elevated to 88% with good pleth. Pt still needing 1 L to keep sats above 88% during ambulation. C/o slight SOB but was able to tolerate increased gait distance without rest break. Pt returned to bed with call bell in reach and all needs met.   Sofia Dunn  Mobility Specialist Please contact via [employer] office at [phone]

## 2023-12-22 NOTE — Progress Notes (Signed)
 Mobility Specialist Progress Note:   12/22/23 1228  Mobility  Activity Ambulated with assistance in hallway  Level of Assistance Standby assist, set-up cues, supervision of patient - no hands on  Assistive Device None  Distance Ambulated (ft) 250 ft  Activity Response Tolerated well  Mobility Referral Yes  Mobility visit 1 Mobility  Mobility Specialist Start Time (ACUTE ONLY) 0940  Mobility Specialist Stop Time (ACUTE ONLY) 0950  Mobility Specialist Time Calculation (min) (ACUTE ONLY) 10 min   Pre Mobility: 94% SpO2 RA During Mobility: 94 HR , 79%-91% SpO2 RA - 1 L Post Mobility: 92% SpO2 1 L  Pt received in bathroom, agreeable to mobility. Desat to 79% on RA during ambulation. Pursed lip breathing encouraged. Increased O2 to 1 L sats quickly rose to 91% with good pleth. Pt c/o slight SOB during ambulation, otherwise asx throughout. Pt returned to bed asymptomatic with VSS and all needs met.   Sofia Dunn  Mobility Specialist Please contact via [employer] office at [phone]

## 2023-12-22 NOTE — Plan of Care (Signed)

## 2023-12-23 ENCOUNTER — Encounter (HOSPITAL_COMMUNITY): Payer: Self-pay | Admitting: Internal Medicine

## 2023-12-23 ENCOUNTER — Inpatient Hospital Stay (HOSPITAL_COMMUNITY): Payer: Medicare HMO

## 2023-12-23 DIAGNOSIS — R0609 Other forms of dyspnea: Secondary | ICD-10-CM | POA: Diagnosis not present

## 2023-12-23 DIAGNOSIS — J101 Influenza due to other identified influenza virus with other respiratory manifestations: Secondary | ICD-10-CM | POA: Diagnosis not present

## 2023-12-23 LAB — PROCALCITONIN: Procalcitonin: <0.1 ng/mL

## 2023-12-23 LAB — CBC WITH DIFFERENTIAL/PLATELET
Abs Immature Granulocytes: 0.02 10*3/uL (ref 0.00–0.07)
Basophils Absolute: 0 10*3/uL (ref 0.0–0.1)
Basophils Relative: 0 %
Eosinophils Absolute: 0 10*3/uL (ref 0.0–0.5)
Eosinophils Relative: 0 %
HCT: 35.2 % — ABNORMAL LOW (ref 39.0–52.0)
Hemoglobin: 11.7 g/dL — ABNORMAL LOW (ref 13.0–17.0)
Immature Granulocytes: 0 %
Lymphocytes Relative: 23 %
Lymphs Abs: 1.2 10*3/uL (ref 0.7–4.0)
MCH: 33.2 pg (ref 26.0–34.0)
MCHC: 33.2 g/dL (ref 30.0–36.0)
MCV: 100 fL (ref 80.0–100.0)
Monocytes Absolute: 0.5 10*3/uL (ref 0.1–1.0)
Monocytes Relative: 9 %
Neutro Abs: 3.7 10*3/uL (ref 1.7–7.7)
Neutrophils Relative %: 68 %
Platelets: 149 10*3/uL — ABNORMAL LOW (ref 150–400)
RBC: 3.52 MIL/uL — ABNORMAL LOW (ref 4.22–5.81)
RDW: 13.7 % (ref 11.5–15.5)
WBC: 5.4 10*3/uL (ref 4.0–10.5)
nRBC: 0 % (ref 0.0–0.2)

## 2023-12-23 LAB — COMPREHENSIVE METABOLIC PANEL
ALT: 18 U/L (ref 0–44)
AST: 24 U/L (ref 15–41)
Albumin: 3.6 g/dL (ref 3.5–5.0)
Alkaline Phosphatase: 73 U/L (ref 38–126)
Anion gap: 12 (ref 5–15)
BUN: 29 mg/dL — ABNORMAL HIGH (ref 8–23)
CO2: 22 mmol/L (ref 22–32)
Calcium: 8.9 mg/dL (ref 8.9–10.3)
Chloride: 106 mmol/L (ref 98–111)
Creatinine, Ser: 1.4 mg/dL — ABNORMAL HIGH (ref 0.61–1.24)
GFR, Estimated: 51 mL/min — ABNORMAL LOW (ref >60)
Glucose, Bld: 96 mg/dL (ref 70–99)
Potassium: 4 mmol/L (ref 3.5–5.1)
Sodium: 140 mmol/L (ref 135–145)
Total Bilirubin: 0.7 mg/dL (ref 0.0–1.2)
Total Protein: 6.6 g/dL (ref 6.5–8.1)

## 2023-12-23 LAB — ECHOCARDIOGRAM COMPLETE
AR max vel: 2.04 cm2
AV Area VTI: 1.94 cm2
AV Area mean vel: 1.92 cm2
AV Mean grad: 4 mm[Hg]
AV Peak grad: 8 mm[Hg]
Ao pk vel: 1.41 m/s
Area-P 1/2: 4.15 cm2
Height: 68 in
MV M vel: 4.56 m/s
MV Peak grad: 83.2 mm[Hg]
MV VTI: 1.82 cm2
S' Lateral: 3.1 cm
Weight: 3456.81 [oz_av]

## 2023-12-23 LAB — EXPECTORATED SPUTUM ASSESSMENT W GRAM STAIN, RFLX TO RESP C

## 2023-12-23 LAB — MRSA NEXT GEN BY PCR, NASAL: MRSA by PCR Next Gen: DETECTED — AB

## 2023-12-23 LAB — HIV ANTIBODY (ROUTINE TESTING W REFLEX): HIV Screen 4th Generation wRfx: NONREACTIVE

## 2023-12-23 LAB — MAGNESIUM: Magnesium: 2 mg/dL (ref 1.7–2.4)

## 2023-12-23 MED ORDER — MUPIROCIN 2 % EX OINT
1.0000 | TOPICAL_OINTMENT | Freq: Two times a day (BID) | CUTANEOUS | Status: DC
  Administered 2023-12-23 – 2023-12-25 (×5): 1 via NASAL
  Filled 2023-12-23 (×3): qty 22

## 2023-12-23 MED ORDER — AZITHROMYCIN 250 MG PO TABS
500.0000 mg | ORAL_TABLET | Freq: Every day | ORAL | Status: DC
  Administered 2023-12-23 – 2023-12-24 (×2): 500 mg via ORAL
  Filled 2023-12-23 (×2): qty 2

## 2023-12-23 MED ORDER — VANCOMYCIN HCL IN DEXTROSE 1-5 GM/200ML-% IV SOLN
1000.0000 mg | INTRAVENOUS | Status: DC
  Administered 2023-12-23 – 2023-12-24 (×2): 1000 mg via INTRAVENOUS
  Filled 2023-12-23 (×2): qty 200

## 2023-12-23 MED ORDER — PERFLUTREN LIPID MICROSPHERE
1.0000 mL | INTRAVENOUS | Status: AC | PRN
  Administered 2023-12-23: 3 mL via INTRAVENOUS

## 2023-12-23 MED ORDER — DIPHENHYDRAMINE HCL 25 MG PO CAPS
25.0000 mg | ORAL_CAPSULE | Freq: Every evening | ORAL | Status: DC | PRN
  Administered 2023-12-23 – 2023-12-24 (×2): 25 mg via ORAL
  Filled 2023-12-23 (×2): qty 1

## 2023-12-23 MED ORDER — CHLORHEXIDINE GLUCONATE CLOTH 2 % EX PADS
6.0000 | MEDICATED_PAD | Freq: Every day | CUTANEOUS | Status: DC
  Administered 2023-12-23 – 2023-12-25 (×3): 6 via TOPICAL

## 2023-12-23 NOTE — Progress Notes (Signed)
  Echocardiogram 2D Echocardiogram has been performed.  Peter [name] 12/23/2023, 1:34 PM

## 2023-12-23 NOTE — Plan of Care (Signed)
  Problem: Elimination: Goal: Will not experience complications related to bowel motility Outcome: Progressing   Problem: Elimination: Goal: Will not experience complications related to urinary retention Outcome: Progressing   Problem: Pain Managment: Goal: General experience of comfort will improve and/or be controlled Outcome: Progressing   Problem: Safety: Goal: Ability to remain free from injury will improve Outcome: Progressing   Problem: Skin Integrity: Goal: Risk for impaired skin integrity will decrease Outcome: Progressing   Problem: Clinical Measurements: Goal: Ability to maintain a body temperature in the normal range will improve Outcome: Progressing   Problem: Respiratory: Goal: Ability to maintain a clear airway will improve Outcome: Progressing

## 2023-12-23 NOTE — Progress Notes (Signed)
Mobility Specialist Progress Note:   12/23/23 1111  Mobility  Activity Ambulated independently in hallway  Level of Assistance Modified independent, requires aide device or extra time  Assistive Device  (IV Pole)  Distance Ambulated (ft) 550 ft  Activity Response Tolerated well  Mobility Referral Yes  Mobility visit 1 Mobility  Mobility Specialist Start Time (ACUTE ONLY) 0910  Mobility Specialist Stop Time (ACUTE ONLY) 0927  Mobility Specialist Time Calculation (min) (ACUTE ONLY) 17 min   Pre Mobility: 74 HR ,  98% SpO2 RA During Mobility: 94 HR ,89%-93% SpO2 RA Post Mobility: 91 HR , 91% SpO2 RA  Pt received in bed, agreeable to mobility. Ambulated on RA. VSS throughout session. See vitals above. Pt denied any SOB during ambulation, asx throughout. Pt stated that he feels stronger compared to yesterdays session. Pt returned to bed with call bell in reach and all needs met.   Leory Plowman  Mobility Specialist Please contact via [employer] office at [phone]

## 2023-12-23 NOTE — Plan of Care (Signed)
Problem: Clinical Measurements: Goal: Respiratory complications will improve Outcome: Progressing Goal: Cardiovascular complication will be avoided Outcome: Progressing   Problem: Activity: Goal: Risk for activity intolerance will decrease Outcome: Progressing   Problem: Nutrition: Goal: Adequate nutrition will be maintained Outcome: Progressing

## 2023-12-23 NOTE — Progress Notes (Signed)
PROGRESS NOTE    Peter [name]  ZOX:[id number] DOB: [birth year] DOA: 12/19/2023 PCP: Darrow Bussing, MD    Brief Narrative:  78 year old with history of hypertension, coronary artery disease, GERD, CKD stage IIIa, chronic A-fib on Xarelto, chronic systolic heart failure presented with about 5 days of shortness of breath and wheezing.  He was found to have influenza A infection.  He is admitted due to significant symptoms. Patient continues to be significantly short of breath.  2/10, a CT scan showed consolidative pneumonia.  MRSA swab positive.  Subjective:  Patient seen and examined.  After doing much respiratory therapy yesterday he has improvement of symptoms.  Afebrile overnight.  Currently remains on room air and on mobility he was able to maintain on room air.  Has significant cough and yellow sputum production.   Assessment & Plan:   Postviral pneumonia: CT scan 2/10, consolidation right apex and superior segment of the right lower lobe, central cavitation and bronchiectasis.  Patient does have a history of interstitial lung disease and subpleural scarring.  Probably mucous plugging/postviral pneumonia. Patient currently hemodynamically stable.  He is clinically improving.  However given severity of consolidation on his CT scan, will treat as pneumonia, cover for MRSA. Start patient on vancomycin and azithromycin.  MRSA PCR positive. Chest physiotherapy, incentive spirometry, deep breathing exercises, sputum induction, mucolytic's and bronchodilators. Sputum cultures, blood cultures, Legionella and streptococcal antigen. Supplemental oxygen to keep saturations more than 90%. Continue low-dose prednisone.  5 days of Tamiflu to complete.  Chronic combined heart failure: clinically euvolemic.  Latest echocardiogram from 2018 with normal ejection fraction.  Will recheck echocardiogram today.  Chronic medical issues including GERD, on PPI CKD stage IIIa, creatinine is at about  baseline. Anemia of chronic disease, fairly stable. Chronic A-fib on Xarelto, currently sinus rhythm.  Therapeutic on Xarelto. Coronary artery disease, on Lipitor, Coreg, hydralazine, lisinopril.     DVT prophylaxis:  rivaroxaban (XARELTO) tablet 20 mg   Code Status: Full code Family Communication: None at the bedside Disposition Plan: Status is: Inpatient Remains inpatient appropriate because: Persistently symptomatic shortness of breath.     Consultants:  None  Procedures:  None  Antimicrobials:  Tamiflu Vancomycin azithromycin 2/11---     Objective: Vitals:   12/22/23 2048 12/23/23 0627 12/23/23 0827 12/23/23 0842  BP:  132/74  (!) 143/91  Pulse: 71 (!) 57  72  Resp: 16 18  17   Temp:  97.8 F (36.6 C)  98.1 F (36.7 C)  TempSrc:  Oral  Oral  SpO2: 95% 96% 97% 95%  Weight:         Filed Weights   12/19/23 1111  Weight: 98 kg    Examination:  General exam: Appears calm and comfortable.  Pleasant and interactive. Respiratory system: Mostly clear.  Conducted upper airway sounds. Cardiovascular system: S1 & S2 heard, RRR. No pedal edema. Gastrointestinal system: Abdomen is nondistended, soft and nontender. No organomegaly or masses felt. Normal bowel sounds heard. Central nervous system: Alert and oriented. No focal neurological deficits. Extremities: Symmetric 5 x 5 power. Skin: No rashes, lesions or ulcers Psychiatry: Judgement and insight appear normal. Mood & affect appropriate.     Data Reviewed: I have personally reviewed following labs and imaging studies  CBC: Recent Labs  Lab 12/19/23 1117 12/21/23 0514 12/23/23 0808  WBC 7.9 5.5 5.4  NEUTROABS 6.8  --  3.7  HGB 12.6* 11.8* 11.7*  HCT 38.7* 36.8* 35.2*  MCV 103.8* 102.8* 100.0  PLT 144* 148* 149*  Basic Metabolic Panel: Recent Labs  Lab 12/19/23 1117 12/21/23 0514 12/23/23 0808  NA 139 139 140  K 4.1 3.8 4.0  CL 103 102 106  CO2 24 23 22   GLUCOSE 109* 100* 96  BUN 18  27* 29*  CREATININE 1.35* 1.34* 1.40*  CALCIUM 9.1 9.0 8.9  MG  --   --  2.0   GFR: Estimated Creatinine Clearance: 49.3 mL/min (A) (by C-G formula based on SCr of 1.4 mg/dL (H)). Liver Function Tests: Recent Labs  Lab 12/19/23 1520 12/21/23 0514 12/23/23 0808  AST 35 35 24  ALT 18 18 18   ALKPHOS 83 82 73  BILITOT 0.7 0.6 0.7  PROT 6.7 6.8 6.6  ALBUMIN 3.4* 3.4* 3.6   No results for input(s): "LIPASE", "AMYLASE" in the last 168 hours. No results for input(s): "AMMONIA" in the last 168 hours. Coagulation Profile: No results for input(s): "INR", "PROTIME" in the last 168 hours. Cardiac Enzymes: No results for input(s): "CKTOTAL", "CKMB", "CKMBINDEX", "TROPONINI" in the last 168 hours. BNP (last 3 results) No results for input(s): "PROBNP" in the last 8760 hours. HbA1C: No results for input(s): "HGBA1C" in the last 72 hours. CBG: No results for input(s): "GLUCAP" in the last 168 hours. Lipid Profile: No results for input(s): "CHOL", "HDL", "LDLCALC", "TRIG", "CHOLHDL", "LDLDIRECT" in the last 72 hours. Thyroid Function Tests: No results for input(s): "TSH", "T4TOTAL", "FREET4", "T3FREE", "THYROIDAB" in the last 72 hours. Anemia Panel: No results for input(s): "VITAMINB12", "FOLATE", "FERRITIN", "TIBC", "IRON", "RETICCTPCT" in the last 72 hours.  Sepsis Labs: Recent Labs  Lab 12/19/23 1305  PROCALCITON <0.10    Recent Results (from the past 240 hours)  Resp panel by RT-PCR (RSV, Flu A&B, Covid) Anterior Nasal Swab     Status: Abnormal   Collection Time: 12/19/23 11:17 AM   Specimen: Anterior Nasal Swab  Result Value Ref Range Status   SARS Coronavirus 2 by RT PCR NEGATIVE NEGATIVE Final   Influenza A by PCR POSITIVE (A) NEGATIVE Final   Influenza B by PCR NEGATIVE NEGATIVE Final    Comment: (NOTE) The Xpert Xpress SARS-CoV-2/FLU/RSV plus assay is intended as an aid in the diagnosis of influenza from Nasopharyngeal swab specimens and should not be used as a sole  basis for treatment. Nasal washings and aspirates are unacceptable for Xpert Xpress SARS-CoV-2/FLU/RSV testing.  Fact Sheet for Patients: BloggerCourse.com  Fact Sheet for Healthcare Providers: SeriousBroker.it  This test is not yet approved or cleared by the [place] FDA and has been authorized for detection and/or diagnosis of SARS-CoV-2 by FDA under an Emergency Use Authorization (EUA). This EUA will remain in effect (meaning this test can be used) for the duration of the COVID-19 declaration under Section 564(b)(1) of the Act, 21 U.S.C. section 360bbb-3(b)(1), unless the authorization is terminated or revoked.     Resp Syncytial Virus by PCR NEGATIVE NEGATIVE Final    Comment: (NOTE) Fact Sheet for Patients: BloggerCourse.com  Fact Sheet for Healthcare Providers: SeriousBroker.it  This test is not yet approved or cleared by the [place] FDA and has been authorized for detection and/or diagnosis of SARS-CoV-2 by FDA under an Emergency Use Authorization (EUA). This EUA will remain in effect (meaning this test can be used) for the duration of the COVID-19 declaration under Section 564(b)(1) of the Act, 21 U.S.C. section 360bbb-3(b)(1), unless the authorization is terminated or revoked.  Performed at [facility], [address] [place], [place] [address]   Blood culture (routine x 2)  Status: None (Preliminary result)   Collection Time: 12/19/23  2:04 PM   Specimen: BLOOD  Result Value Ref Range Status   Specimen Description BLOOD LEFT ANTECUBITAL  Final   Special Requests   Final    BOTTLES DRAWN AEROBIC AND ANAEROBIC Blood Culture adequate volume   Culture   Final    NO GROWTH 4 DAYS Performed at [facility], [address], [place], [place] [address]    Report Status PENDING  Incomplete  Blood culture (routine x 2)     Status: None  (Preliminary result)   Collection Time: 12/19/23  2:35 PM   Specimen: BLOOD RIGHT HAND  Result Value Ref Range Status   Specimen Description BLOOD RIGHT HAND  Final   Special Requests   Final    BOTTLES DRAWN AEROBIC AND ANAEROBIC Blood Culture results may not be optimal due to an inadequate volume of blood received in culture bottles   Culture   Final    NO GROWTH 4 DAYS Performed at [facility], [address] [place], [place] [address]    Report Status PENDING  Incomplete         Radiology Studies: CT CHEST WO CONTRAST Result Date: 12/22/2023 CLINICAL DATA:  Pneumonia, complication suspected, xray done EXAM: CT CHEST WITHOUT CONTRAST TECHNIQUE: Multidetector CT imaging of the chest was performed following the standard protocol without IV contrast. RADIATION DOSE REDUCTION: This exam was performed according to the departmental dose-optimization program which includes automated exposure control, adjustment of the mA and/or kV according to patient size and/or use of iterative reconstruction technique. COMPARISON:  12/22/2023, 10/23/2016, 07/30/2022 FINDINGS: Cardiovascular: Unenhanced imaging of the heart demonstrates cardiomegaly without significant pericardial effusion. Normal caliber of the thoracic aorta. Atherosclerosis throughout the aorta and coronary vasculature. Mediastinum/Nodes: Mediastinal lymphadenopathy, measuring up to 15 mm in short axis in the right paratracheal region. Thyroid, trachea, and esophagus are unremarkable. Moderate hiatal hernia. Lungs/Pleura: Masslike consolidation is seen within the right upper lobe and superior segment of the right lower lobe. Right upper lobe consolidation measures up to 5.8 x 3.9 cm, with right lower lobe consolidation measuring up to 3.9 x 3.9 cm. Both areas demonstrate central cavitation and associated bronchiectasis. Progressive cavitating pneumonia is suspected. Continued follow-up to radiographic resolution is recommended to exclude  underlying neoplasm. Patchy ground-glass consolidation within the left lower lobe may reflect hypoventilatory change or pneumonia. Basilar predominant interlobular septal thickening and subpleural scarring is noted, which has progressed since prior study. Trace bilateral pleural effusions. No pneumothorax. Central airways are patent. Upper Abdomen: Calcified gallstone without evidence of acute cholecystitis. Remainder of the upper abdomen is unremarkable. Musculoskeletal: No acute or destructive bony abnormalities. Reconstructed images demonstrate no additional findings. IMPRESSION: 1. Dense areas of masslike consolidation within the right apex and superior segment right lower lobe, with areas of central cavitation and bronchiectasis. Given clinical presentation, findings are consistent with cavitating or necrotizing pneumonia. However, continued follow-up is recommended to document resolution and exclude underlying neoplasm. 2. Patchy left lower lobe ground-glass consolidation, which may reflect additional focus of pneumonia versus hypoventilatory change. 3. Basilar predominant interstitial lung disease, with progressive subpleural scarring since previous exams. 4. Trace bilateral pleural effusions. 5. Mediastinal lymphadenopathy. 6. Cardiomegaly. 7. Moderate hiatal hernia. 8. Incidental cholelithiasis without evidence of acute cholecystitis. 9. Aortic Atherosclerosis (ICD10-I70.0). Coronary artery atherosclerosis. Electronically Signed   By: Sharlet Salina M.D.   On: 12/22/2023 21:49   DG CHEST PORT 1 VIEW Result Date: 12/22/2023 CLINICAL DATA:  Shortness  of breath fever and cough EXAM: PORTABLE CHEST 1 VIEW COMPARISON:  12/19/2023, 07/16/2016 FINDINGS: Stable enlarged cardiomediastinal silhouette. New mild volume loss at the right upper lobe with increased pleuroparenchymal opacity compared to prior. Possible small right effusion. IMPRESSION: New mild volume loss at the right upper lobe with increased  pleuroparenchymal opacity compared to prior, findings could be secondary to atelectasis and or pneumonia with centrally obstructing process not excluded. Contrast-enhanced chest CT is recommended for further assessment. Suspected small right effusion.  Stable cardiomegaly Electronically Signed   By: Jasmine Pang M.D.   On: 12/22/2023 15:00        Scheduled Meds:  allopurinol  100 mg Oral QHS   And   allopurinol  300 mg Oral q AM   atorvastatin  40 mg Oral Daily   azithromycin  500 mg Oral Daily   budesonide (PULMICORT) nebulizer solution  0.25 mg Nebulization BID   carvedilol  25 mg Oral BID WC   dextromethorphan-guaiFENesin  1 tablet Oral BID   furosemide  40 mg Intravenous Once   furosemide  40 mg Oral Daily   lisinopril  40 mg Oral Daily   oseltamivir  30 mg Oral BID   pantoprazole  40 mg Oral Daily   predniSONE  20 mg Oral Q breakfast   rivaroxaban  20 mg Oral QHS   sertraline  25 mg Oral Daily   Continuous Infusions:  vancomycin 1,000 mg (12/23/23 0836)     LOS: 4 days    Time spent: 40 minutes    Dorcas Carrow, MD [facility]

## 2023-12-23 NOTE — Plan of Care (Signed)

## 2023-12-23 NOTE — Progress Notes (Signed)
Occupational Therapy Treatment and DISCHARGE  Patient Details Name: Peter [name] MRN: [id number] DOB: 05/31/1946 Today's Date: 12/23/2023   History of present illness Pt is a 78 yr old male who [facility] ED on 2/7 due to SOB, cough and fever. Positive for influenza A and pt met sepsis criteria. PMH: CAD, GERD, CKD, afib, and chronic systolic congestive heart failure.   OT comments  Pt progressing toward established OT goals with all goals met this session. Pt able to gather items for dressing, bathing, and grooming and take to respective locations as well as perform tasks. Pt able to perform tub shower transfer with mod I. Educated regarding energy conservation and compensatory techniques for ADL and IADL. Pt confident with self pacing. OT to sign off. No further needs identified.       If plan is discharge home, recommend the following:  Assistance with cooking/housework;Help with stairs or ramp for entrance   Equipment Recommendations  Tub/shower seat    Recommendations for Other Services      Precautions / Restrictions Restrictions Weight Bearing Restrictions Per Provider Order: No       Mobility Bed Mobility Overal bed mobility: Independent                  Transfers Overall transfer level: Independent                       Balance                                           ADL either performed or assessed with clinical judgement   ADL       Grooming: Independent;Standing               Lower Body Dressing: Modified independent;Sit to/from stand   Toilet Transfer: Modified Independent       Tub/ Shower Transfer: Modified independent;Tub transfer          Extremity/Trunk Assessment Upper Extremity Assessment Upper Extremity Assessment: Overall WFL for tasks assessed   Lower Extremity Assessment Lower Extremity Assessment: Defer to PT evaluation        Vision   Vision Assessment?: No apparent visual  deficits   Perception     Praxis     Communication Communication Communication: No apparent difficulties   Cognition Arousal: Alert Behavior During Therapy: WFL for tasks assessed/performed Cognition: No apparent impairments                               Following commands: Intact        Cueing   Cueing Techniques: Verbal cues  Exercises      Shoulder Instructions       General Comments SpO2 WFL    Pertinent Vitals/ Pain       Pain Assessment Pain Assessment: No/denies pain  Home Living                                          Prior Functioning/Environment              Frequency  Min 1X/week        Progress Toward Goals  OT Goals(current goals can now be found in the  care plan section)  Progress towards OT goals: Goals met/education completed, patient discharged from OT  Acute Rehab OT Goals Patient Stated Goal: get out of hospital OT Goal Formulation: With patient Time For Goal Achievement: 01/03/24 Potential to Achieve Goals: Good  Plan      Co-evaluation                 AM-PAC OT "6 Clicks" Daily Activity     Outcome Measure   Help from another person eating meals?: None Help from another person taking care of personal grooming?: None Help from another person toileting, which includes using toliet, bedpan, or urinal?: None Help from another person bathing (including washing, rinsing, drying)?: None Help from another person to put on and taking off regular upper body clothing?: None Help from another person to put on and taking off regular lower body clothing?: None 6 Click Score: 24    End of Session    OT Visit Diagnosis: Pain;Muscle weakness (generalized) (M62.81)   Activity Tolerance Patient tolerated treatment well   Patient Left in chair;with call bell/phone within reach   Nurse Communication Mobility status        Time: 2130-8657 OT Time Calculation (min): 20 min  Charges: OT  General Charges $OT Visit: 1 Visit OT Treatments $Self Care/Home Management : 8-22 mins  Myrla Halsted, OTD, OTR/L [facility] Acute Rehabilitation Office: [phone]   Myrla Halsted 12/23/2023, 5:47 PM

## 2023-12-23 NOTE — Progress Notes (Signed)
Pharmacy Antibiotic Note  Peter [name] is a 78 y.o. male admitted on 12/19/2023 with pneumonia.  Pharmacy has been consulted for vancomycin dosing.  Plan: Vancomycin 1000mg  q24h (eAUC 499) -goal AUC 400-600, trough 15-65mcg/mL F/u renal func, LOT F/u MRSA swab   Weight: 98 kg (216 lb 0.8 oz)  Temp (24hrs), Avg:98.3 F (36.8 C), Min:97.8 F (36.6 C), Max:98.6 F (37 C)  Recent Labs  Lab 12/19/23 1117 12/21/23 0514  WBC 7.9 5.5  CREATININE 1.35* 1.34*    Estimated Creatinine Clearance: 51.5 mL/min (A) (by C-G formula based on SCr of 1.34 mg/dL (H)).    Allergies  Allergen Reactions   Spironolactone     Tender breast    Antimicrobials this admission: Tamiflu x5 days Azithro 2/11> Vanc 2/11>   Dose adjustments this admission:   Microbiology results:   Thank you for allowing pharmacy to be a part of this patient's care.  Calton Dach, PharmD, BCCCP Clinical Pharmacist 12/23/2023 8:01 AM

## 2023-12-23 NOTE — Progress Notes (Signed)
   12/23/23 2037  BiPAP/CPAP/SIPAP  BiPAP/CPAP/SIPAP Pt Type Adult (home)  BiPAP/CPAP/SIPAP Resmed (home)  Patient Home Equipment Yes  Safety Check Completed by RT for Home Unit Yes, no issues noted

## 2023-12-24 ENCOUNTER — Encounter (HOSPITAL_COMMUNITY): Payer: Self-pay | Admitting: Internal Medicine

## 2023-12-24 DIAGNOSIS — J101 Influenza due to other identified influenza virus with other respiratory manifestations: Secondary | ICD-10-CM | POA: Diagnosis not present

## 2023-12-24 LAB — CULTURE, BLOOD (ROUTINE X 2)
Culture: NO GROWTH
Culture: NO GROWTH
Special Requests: ADEQUATE

## 2023-12-24 LAB — LEGIONELLA PNEUMOPHILA SEROGP 1 UR AG: L. pneumophila Serogp 1 Ur Ag: NEGATIVE

## 2023-12-24 MED ORDER — DOXYCYCLINE HYCLATE 100 MG PO TABS
100.0000 mg | ORAL_TABLET | Freq: Two times a day (BID) | ORAL | Status: DC
Start: 2023-12-24 — End: 2023-12-25
  Administered 2023-12-24 – 2023-12-25 (×3): 100 mg via ORAL
  Filled 2023-12-24 (×3): qty 1

## 2023-12-24 NOTE — Progress Notes (Signed)
Mobility Specialist Progress Note:   12/24/23 1116  Mobility  Activity Ambulated with assistance in hallway  Level of Assistance Modified independent, requires aide device or extra time  Assistive Device  (IV Pole)  Distance Ambulated (ft) 550 ft  Activity Response Tolerated well  Mobility Referral Yes  Mobility visit 1 Mobility  Mobility Specialist Start Time (ACUTE ONLY) 0945  Mobility Specialist Stop Time (ACUTE ONLY) 0955  Mobility Specialist Time Calculation (min) (ACUTE ONLY) 10 min   Pre Mobility: 96% SpO2 RA During Mobility: 94 HR ,  83%-93% SpO2 RA Post Mobility:  93% SpO2 RA  Pt received in bed, agreeable to mobility. Pt found with coughing spell needing extra time before standing. Ambulated on RA. Pt desat to 83% but with pursed lip breathing quickly elevated to 91% during ambulation. C/o slight SOB during session, otherwise asx throughout. Pt returned to bed asymptomatic with call bell in reach and all needs met.   Leory Plowman  Mobility Specialist Please contact via [employer] office at [phone]

## 2023-12-24 NOTE — Progress Notes (Addendum)
Mobility Specialist Progress Note:   12/24/23 1605  Mobility  Activity Ambulated independently in hallway  Level of Assistance Independent after set-up  Assistive Device None  Distance Ambulated (ft) 1110 ft  Activity Response Tolerated well  Mobility Referral Yes  Mobility visit 1 Mobility  Mobility Specialist Start Time (ACUTE ONLY) 1445  Mobility Specialist Stop Time (ACUTE ONLY) 1500  Mobility Specialist Time Calculation (min) (ACUTE ONLY) 15 min   Pre Mobility: 97% SpO2 RA During Mobility: 93 HR ,  84%-92% SpO2 RA Post Mobility:  94% SpO2 RA  Pt received in bed, agreeable to mobility. Desat to 84% on RA but quickly elevated to 92% with pursed lip breathing. Pt was able tolerate 2x laps around unit this session. Denied any SOB during ambulation, asx throughout. Pt returned to bed with call bell in reach and all needs met.   Leory Plowman  Mobility Specialist Please contact via [employer] office at [phone]

## 2023-12-24 NOTE — Progress Notes (Signed)
PROGRESS NOTE    MACLOVIO [name]  YNW:[id number] DOB: [birth year] DOA: 12/19/2023 PCP: Darrow Bussing, MD    Brief Narrative:  78 year old with history of hypertension, coronary artery disease, GERD, CKD stage IIIa, chronic A-fib on Xarelto, chronic systolic heart failure presented with about 5 days of shortness of breath and wheezing.  He was found to have influenza A infection.  He is admitted due to significant symptoms. Patient continued to be significantly short of breath.  2/10, a CT scan showed consolidative pneumonia.  MRSA swab positive.  Subjective:  Patient seen and examined.  Afebrile overnight.  Some clinical improvement today.  Able to remain on room air.  Using incentive spirometry.  Desats on mobility.   Assessment & Plan:   Postviral pneumonia: Acute hypoxia. CT scan 2/10, consolidation right apex and superior segment of the right lower lobe, central cavitation and bronchiectasis.  Patient does have a history of interstitial lung disease and subpleural scarring.  Probably mucous plugging/postviral pneumonia. Patient currently hemodynamically stable.  He is clinically improving.  However given severity of consolidation on his CT scan, treated as postviral pneumonia with vancomycin and azithromycin. MRSA PCR positive. Chest physiotherapy, incentive spirometry, deep breathing exercises, sputum induction, mucolytic's and bronchodilators. Sputum cultures, blood cultures, Legionella and streptococcal antigen.  Negative so far. Supplemental oxygen to keep saturations more than 90%. Continue low-dose prednisone.  Completed Tamiflu. With clinical improvement, will change antibiotics to oral doxycycline today and monitor.  Chronic combined heart failure: clinically euvolemic.  Latest echocardiogram from 2018 with normal ejection fraction.  Repeat echocardiogram fairly stable.  Chronic medical issues including GERD, on PPI CKD stage IIIa, creatinine is at about baseline. Anemia  of chronic disease, fairly stable. Chronic A-fib on Xarelto, currently sinus rhythm.  Therapeutic on Xarelto. Coronary artery disease, on Lipitor, Coreg, hydralazine, lisinopril.     DVT prophylaxis:  rivaroxaban (XARELTO) tablet 20 mg   Code Status: Full code Family Communication: None at the bedside Disposition Plan: Status is: Inpatient Remains inpatient appropriate because: Persistently symptomatic shortness of breath.     Consultants:  None  Procedures:  None  Antimicrobials:  Tamiflu Vancomycin azithromycin 2/11--- 2/12 Doxycycline 2/12---     Objective: Vitals:   12/24/23 0417 12/24/23 0739 12/24/23 0915 12/24/23 0922  BP: 120/72 136/81  136/81  Pulse: (!) 58 71    Resp: 16 16    Temp: 98.2 F (36.8 C) 98.3 F (36.8 C)    TempSrc: Oral Oral    SpO2: 97% 98% 100%   Weight:      Height:         Filed Weights   12/19/23 1111  Weight: 98 kg    Examination:  General exam: Appears calm and comfortable.  Pleasant and interactive. Comfortable on room air today. Respiratory system: Mostly clear.  No added sounds. Cardiovascular system: S1 & S2 heard, RRR. No pedal edema. Gastrointestinal system: Abdomen is nondistended, soft and nontender. No organomegaly or masses felt. Normal bowel sounds heard. Central nervous system: Alert and oriented. No focal neurological deficits. Extremities: Symmetric 5 x 5 power. Skin: No rashes, lesions or ulcers Psychiatry: Judgement and insight appear normal. Mood & affect appropriate.     Data Reviewed: I have personally reviewed following labs and imaging studies  CBC: Recent Labs  Lab 12/19/23 1117 12/21/23 0514 12/23/23 0808  WBC 7.9 5.5 5.4  NEUTROABS 6.8  --  3.7  HGB 12.6* 11.8* 11.7*  HCT 38.7* 36.8* 35.2*  MCV 103.8* 102.8* 100.0  PLT 144*  148* 149*   Basic Metabolic Panel: Recent Labs  Lab 12/19/23 1117 12/21/23 0514 12/23/23 0808  NA 139 139 140  K 4.1 3.8 4.0  CL 103 102 106  CO2 24 23  22   GLUCOSE 109* 100* 96  BUN 18 27* 29*  CREATININE 1.35* 1.34* 1.40*  CALCIUM 9.1 9.0 8.9  MG  --   --  2.0   GFR: Estimated Creatinine Clearance: 49.3 mL/min (A) (by C-G formula based on SCr of 1.4 mg/dL (H)). Liver Function Tests: Recent Labs  Lab 12/19/23 1520 12/21/23 0514 12/23/23 0808  AST 35 35 24  ALT 18 18 18   ALKPHOS 83 82 73  BILITOT 0.7 0.6 0.7  PROT 6.7 6.8 6.6  ALBUMIN 3.4* 3.4* 3.6   No results for input(s): "LIPASE", "AMYLASE" in the last 168 hours. No results for input(s): "AMMONIA" in the last 168 hours. Coagulation Profile: No results for input(s): "INR", "PROTIME" in the last 168 hours. Cardiac Enzymes: No results for input(s): "CKTOTAL", "CKMB", "CKMBINDEX", "TROPONINI" in the last 168 hours. BNP (last 3 results) No results for input(s): "PROBNP" in the last 8760 hours. HbA1C: No results for input(s): "HGBA1C" in the last 72 hours. CBG: No results for input(s): "GLUCAP" in the last 168 hours. Lipid Profile: No results for input(s): "CHOL", "HDL", "LDLCALC", "TRIG", "CHOLHDL", "LDLDIRECT" in the last 72 hours. Thyroid Function Tests: No results for input(s): "TSH", "T4TOTAL", "FREET4", "T3FREE", "THYROIDAB" in the last 72 hours. Anemia Panel: No results for input(s): "VITAMINB12", "FOLATE", "FERRITIN", "TIBC", "IRON", "RETICCTPCT" in the last 72 hours.  Sepsis Labs: Recent Labs  Lab 12/19/23 1305 12/23/23 0808  PROCALCITON <0.10 <0.10    Recent Results (from the past 240 hours)  Resp panel by RT-PCR (RSV, Flu A&B, Covid) Anterior Nasal Swab     Status: Abnormal   Collection Time: 12/19/23 11:17 AM   Specimen: Anterior Nasal Swab  Result Value Ref Range Status   SARS Coronavirus 2 by RT PCR NEGATIVE NEGATIVE Final   Influenza A by PCR POSITIVE (A) NEGATIVE Final   Influenza B by PCR NEGATIVE NEGATIVE Final    Comment: (NOTE) The Xpert Xpress SARS-CoV-2/FLU/RSV plus assay is intended as an aid in the diagnosis of influenza from  Nasopharyngeal swab specimens and should not be used as a sole basis for treatment. Nasal washings and aspirates are unacceptable for Xpert Xpress SARS-CoV-2/FLU/RSV testing.  Fact Sheet for Patients: BloggerCourse.com  Fact Sheet for Healthcare Providers: SeriousBroker.it  This test is not yet approved or cleared by the [place] FDA and has been authorized for detection and/or diagnosis of SARS-CoV-2 by FDA under an Emergency Use Authorization (EUA). This EUA will remain in effect (meaning this test can be used) for the duration of the COVID-19 declaration under Section 564(b)(1) of the Act, 21 U.S.C. section 360bbb-3(b)(1), unless the authorization is terminated or revoked.     Resp Syncytial Virus by PCR NEGATIVE NEGATIVE Final    Comment: (NOTE) Fact Sheet for Patients: BloggerCourse.com  Fact Sheet for Healthcare Providers: SeriousBroker.it  This test is not yet approved or cleared by the [place] FDA and has been authorized for detection and/or diagnosis of SARS-CoV-2 by FDA under an Emergency Use Authorization (EUA). This EUA will remain in effect (meaning this test can be used) for the duration of the COVID-19 declaration under Section 564(b)(1) of the Act, 21 U.S.C. section 360bbb-3(b)(1), unless the authorization is terminated or revoked.  Performed at [facility], [address] [place], [place] [address]  Blood culture (routine x 2)     Status: None   Collection Time: 12/19/23  2:04 PM   Specimen: BLOOD  Result Value Ref Range Status   Specimen Description BLOOD LEFT ANTECUBITAL  Final   Special Requests   Final    BOTTLES DRAWN AEROBIC AND ANAEROBIC Blood Culture adequate volume   Culture   Final    NO GROWTH 5 DAYS Performed at [facility], [address], [place], [place] [address]    Report Status 12/24/2023 FINAL  Final  Blood  culture (routine x 2)     Status: None   Collection Time: 12/19/23  2:35 PM   Specimen: BLOOD RIGHT HAND  Result Value Ref Range Status   Specimen Description BLOOD RIGHT HAND  Final   Special Requests   Final    BOTTLES DRAWN AEROBIC AND ANAEROBIC Blood Culture results may not be optimal due to an inadequate volume of blood received in culture bottles   Culture   Final    NO GROWTH 5 DAYS Performed at [facility], [address] [place] [address] [place], [place] [address]    Report Status 12/24/2023 FINAL  Final  Expectorated Sputum Assessment w Gram Stain, Rflx to Resp Cult     Status: None   Collection Time: 12/23/23  7:40 AM   Specimen: Expectorated Sputum  Result Value Ref Range Status   Specimen Description EXPECTORATED SPUTUM  Final   Special Requests NONE  Final   Sputum evaluation   Final    THIS SPECIMEN IS ACCEPTABLE FOR SPUTUM CULTURE Performed at [facility], [address] [place], [place] [address]    Report Status 12/23/2023 FINAL  Final  MRSA Next Gen by PCR, Nasal     Status: Abnormal   Collection Time: 12/23/23  7:40 AM   Specimen: Nasal Mucosa; Nasal Swab  Result Value Ref Range Status   MRSA by PCR Next Gen DETECTED (A) NOT DETECTED Final    Comment: RESULT CALLED TO, READ BACK BY AND VERIFIED WITH: RN KIM D [id number] FCP (NOTE) The GeneXpert MRSA Assay (FDA approved for NASAL specimens only), is one component of a comprehensive MRSA colonization surveillance program. It is not intended to diagnose MRSA infection nor to guide or monitor treatment for MRSA infections. Test performance is not FDA approved in patients less than 18 years old. Performed at [facility], [address], [place], [place] [address]   Culture, Respiratory w Gram Stain     Status: None (Preliminary result)   Collection Time: 12/23/23  7:40 AM  Result Value Ref Range Status   Specimen Description EXPECTORATED SPUTUM  Final   Special Requests NONE Reflexed from [id number]  Final    Gram Stain NO WBC SEEN FEW GRAM POSITIVE COCCI IN PAIRS   Final   Culture   Final    CULTURE REINCUBATED FOR BETTER GROWTH Performed at [facility], [address] [place], [place] [address]    Report Status PENDING  Incomplete  Culture, blood (routine x 2) Call MD if unable to obtain prior to antibiotics being given     Status: None (Preliminary result)   Collection Time: 12/23/23  8:08 AM   Specimen: BLOOD LEFT ARM  Result Value Ref Range Status   Specimen Description BLOOD LEFT ARM  Final   Special Requests   Final    BOTTLES DRAWN AEROBIC AND ANAEROBIC Blood Culture adequate volume   Culture   Final  NO GROWTH < 24 HOURS Performed at [facility], [address], [place], [place] [address]    Report Status PENDING  Incomplete  Culture, blood (routine x 2) Call MD if unable to obtain prior to antibiotics being given     Status: None (Preliminary result)   Collection Time: 12/23/23  8:08 AM   Specimen: BLOOD LEFT HAND  Result Value Ref Range Status   Specimen Description BLOOD LEFT HAND  Final   Special Requests   Final    BOTTLES DRAWN AEROBIC AND ANAEROBIC Blood Culture adequate volume   Culture   Final    NO GROWTH < 24 HOURS Performed at [facility], [address] [place], [place] [address]    Report Status PENDING  Incomplete         Radiology Studies: ECHOCARDIOGRAM COMPLETE Result Date: 12/23/2023    ECHOCARDIOGRAM REPORT   Patient Name:   Peter [name] Date of Exam: 12/23/2023 Medical Rec #:  [id number]        Height:       68.0 in Accession #:    [id number]       Weight:       216.0 lb Date of Birth:  1946/02/12        BSA:          2.112 m Patient Age:    49 years         BP:           132/74 mmHg Patient Gender: M                HR:           63 bpm. Exam Location:  Inpatient Procedure: 2D Echo, Cardiac Doppler, Color Doppler and Intracardiac            Opacification Agent Indications:    Dyspnea  History:        Patient has prior  history of Echocardiogram examinations, most                 recent 12/01/2016. CAD; Risk Factors:Hypertension.  Sonographer:    Karma Ganja Referring Phys: [id number] [facility]  Sonographer Comments: Technically difficult study due to poor echo windows and patient is obese. Image acquisition challenging due to patient body habitus. IMPRESSIONS  1. Left ventricular ejection fraction, by estimation, is 55 to 60%. The left ventricle has normal function. The left ventricle demonstrates regional wall motion abnormalities (see scoring diagram/findings for description). There is moderate concentric left ventricular hypertrophy. Left ventricular diastolic parameters are consistent with Grade II diastolic dysfunction (pseudonormalization).  2. Right ventricular systolic function is normal. The right ventricular size is moderately enlarged. There is mildly elevated pulmonary artery systolic pressure. The estimated right ventricular systolic pressure is 41.7 mmHg.  3. The mitral valve is grossly normal. Trivial mitral valve regurgitation. No evidence of mitral stenosis.  4. The aortic valve is tricuspid. Aortic valve regurgitation is not visualized. No aortic stenosis is present.  5. The inferior vena cava is normal in size with greater than 50% respiratory variability, suggesting right atrial pressure of 3 mmHg. FINDINGS  Left Ventricle: Left ventricular ejection fraction, by estimation, is 55 to 60%. The left ventricle has normal function. The left ventricle demonstrates regional wall motion abnormalities. Definity contrast agent was given IV to delineate the left ventricular endocardial borders. The left ventricular internal cavity size was normal in size. There is moderate concentric left ventricular hypertrophy. Left ventricular diastolic parameters  are consistent with Grade II diastolic dysfunction (pseudonormalization).  LV Wall Scoring: The basal inferolateral segment is hypokinetic. Right Ventricle: The right  ventricular size is moderately enlarged. No increase in right ventricular wall thickness. Right ventricular systolic function is normal. There is mildly elevated pulmonary artery systolic pressure. The tricuspid regurgitant velocity is 3.11 m/s, and with an assumed right atrial pressure of 3 mmHg, the estimated right ventricular systolic pressure is 41.7 mmHg. Left Atrium: Left atrial size was not well visualized. Right Atrium: Right atrial size was not well visualized. Pericardium: There is no evidence of pericardial effusion. Mitral Valve: The mitral valve is grossly normal. Trivial mitral valve regurgitation. No evidence of mitral valve stenosis. MV peak gradient, 5.3 mmHg. The mean mitral valve gradient is 2.0 mmHg. Tricuspid Valve: The tricuspid valve is grossly normal. Tricuspid valve regurgitation is trivial. No evidence of tricuspid stenosis. Aortic Valve: The aortic valve is tricuspid. Aortic valve regurgitation is not visualized. No aortic stenosis is present. Aortic valve mean gradient measures 4.0 mmHg. Aortic valve peak gradient measures 8.0 mmHg. Aortic valve area, by VTI measures 1.94 cm. Pulmonic Valve: The pulmonic valve was grossly normal. Pulmonic valve regurgitation is not visualized. No evidence of pulmonic stenosis. Aorta: The aortic root is normal in size and structure. Venous: The inferior vena cava is normal in size with greater than 50% respiratory variability, suggesting right atrial pressure of 3 mmHg. IAS/Shunts: The interatrial septum was not well visualized.  LEFT VENTRICLE PLAX 2D LVIDd:         4.90 cm   Diastology LVIDs:         3.10 cm   LV e' medial:    5.55 cm/s LV PW:         1.30 cm   LV E/e' medial:  17.3 LV IVS:        1.40 cm   LV e' lateral:   7.72 cm/s LVOT diam:     2.00 cm   LV E/e' lateral: 12.4 LV SV:         60 LV SV Index:   28 LVOT Area:     3.14 cm  RIGHT VENTRICLE             IVC RV Basal diam:  4.80 cm     IVC diam: 1.60 cm RV S prime:     15.60 cm/s TAPSE  (M-mode): 3.3 cm LEFT ATRIUM              Index        RIGHT ATRIUM           Index LA diam:        4.20 cm  1.99 cm/m   RA Area:     25.70 cm LA Vol (A2C):   130.0 ml 61.56 ml/m  RA Volume:   87.50 ml  41.43 ml/m LA Vol (A4C):   78.5 ml  37.17 ml/m LA Biplane Vol: 106.0 ml 50.19 ml/m  AORTIC VALVE AV Area (Vmax):    2.04 cm AV Area (Vmean):   1.92 cm AV Area (VTI):     1.94 cm AV Vmax:           141.00 cm/s AV Vmean:          94.100 cm/s AV VTI:            0.307 m AV Peak Grad:      8.0 mmHg AV Mean Grad:      4.0 mmHg LVOT Vmax:  91.70 cm/s LVOT Vmean:        57.600 cm/s LVOT VTI:          0.190 m LVOT/AV VTI ratio: 0.62  AORTA Ao Root diam: 3.80 cm MITRAL VALVE               TRICUSPID VALVE MV Area (PHT): 4.15 cm    TR Peak grad:   38.7 mmHg MV Area VTI:   1.82 cm    TR Vmax:        311.00 cm/s MV Peak grad:  5.3 mmHg MV Mean grad:  2.0 mmHg    SHUNTS MV Vmax:       1.15 m/s    Systemic VTI:  0.19 m MV Vmean:      60.2 cm/s   Systemic Diam: 2.00 cm MV Decel Time: 183 msec MR Peak grad: 83.2 mmHg MR Vmax:      456.00 cm/s MV E velocity: 95.90 cm/s MV A velocity: 59.20 cm/s MV E/A ratio:  1.62 Lennie Odor MD Electronically signed by Lennie Odor MD Signature Date/Time: 12/23/2023/1:45:08 PM    Final    CT CHEST WO CONTRAST Result Date: 12/22/2023 CLINICAL DATA:  Pneumonia, complication suspected, xray done EXAM: CT CHEST WITHOUT CONTRAST TECHNIQUE: Multidetector CT imaging of the chest was performed following the standard protocol without IV contrast. RADIATION DOSE REDUCTION: This exam was performed according to the departmental dose-optimization program which includes automated exposure control, adjustment of the mA and/or kV according to patient size and/or use of iterative reconstruction technique. COMPARISON:  12/22/2023, 10/23/2016, 07/30/2022 FINDINGS: Cardiovascular: Unenhanced imaging of the heart demonstrates cardiomegaly without significant pericardial effusion. Normal caliber of  the thoracic aorta. Atherosclerosis throughout the aorta and coronary vasculature. Mediastinum/Nodes: Mediastinal lymphadenopathy, measuring up to 15 mm in short axis in the right paratracheal region. Thyroid, trachea, and esophagus are unremarkable. Moderate hiatal hernia. Lungs/Pleura: Masslike consolidation is seen within the right upper lobe and superior segment of the right lower lobe. Right upper lobe consolidation measures up to 5.8 x 3.9 cm, with right lower lobe consolidation measuring up to 3.9 x 3.9 cm. Both areas demonstrate central cavitation and associated bronchiectasis. Progressive cavitating pneumonia is suspected. Continued follow-up to radiographic resolution is recommended to exclude underlying neoplasm. Patchy ground-glass consolidation within the left lower lobe may reflect hypoventilatory change or pneumonia. Basilar predominant interlobular septal thickening and subpleural scarring is noted, which has progressed since prior study. Trace bilateral pleural effusions. No pneumothorax. Central airways are patent. Upper Abdomen: Calcified gallstone without evidence of acute cholecystitis. Remainder of the upper abdomen is unremarkable. Musculoskeletal: No acute or destructive bony abnormalities. Reconstructed images demonstrate no additional findings. IMPRESSION: 1. Dense areas of masslike consolidation within the right apex and superior segment right lower lobe, with areas of central cavitation and bronchiectasis. Given clinical presentation, findings are consistent with cavitating or necrotizing pneumonia. However, continued follow-up is recommended to document resolution and exclude underlying neoplasm. 2. Patchy left lower lobe ground-glass consolidation, which may reflect additional focus of pneumonia versus hypoventilatory change. 3. Basilar predominant interstitial lung disease, with progressive subpleural scarring since previous exams. 4. Trace bilateral pleural effusions. 5. Mediastinal  lymphadenopathy. 6. Cardiomegaly. 7. Moderate hiatal hernia. 8. Incidental cholelithiasis without evidence of acute cholecystitis. 9. Aortic Atherosclerosis (ICD10-I70.0). Coronary artery atherosclerosis. Electronically Signed   By: Sharlet Salina M.D.   On: 12/22/2023 21:49        Scheduled Meds:  allopurinol  100 mg Oral QHS   And   allopurinol  300  mg Oral q AM   atorvastatin  40 mg Oral Daily   budesonide (PULMICORT) nebulizer solution  0.25 mg Nebulization BID   carvedilol  25 mg Oral BID WC   Chlorhexidine Gluconate Cloth  6 each Topical Daily   dextromethorphan-guaiFENesin  1 tablet Oral BID   doxycycline  100 mg Oral Q12H   furosemide  40 mg Intravenous Once   furosemide  40 mg Oral Daily   lisinopril  40 mg Oral Daily   mupirocin ointment  1 Application Nasal BID   pantoprazole  40 mg Oral Daily   rivaroxaban  20 mg Oral QHS   sertraline  25 mg Oral Daily   Continuous Infusions:     LOS: 5 days    Time spent: 40 minutes    Dorcas Carrow, MD [facility]

## 2023-12-24 NOTE — Plan of Care (Signed)

## 2023-12-25 DIAGNOSIS — J101 Influenza due to other identified influenza virus with other respiratory manifestations: Secondary | ICD-10-CM | POA: Diagnosis not present

## 2023-12-25 LAB — CULTURE, RESPIRATORY W GRAM STAIN: Gram Stain: NONE SEEN

## 2023-12-25 MED ORDER — DM-GUAIFENESIN ER 30-600 MG PO TB12: 1.0000 | ORAL_TABLET | Freq: Two times a day (BID) | ORAL | 0 refills | Status: AC

## 2023-12-25 MED ORDER — DOXYCYCLINE HYCLATE 100 MG PO TABS: 100.0000 mg | ORAL_TABLET | Freq: Two times a day (BID) | ORAL | 0 refills | Status: AC

## 2023-12-25 MED ORDER — ALBUTEROL SULFATE HFA 108 (90 BASE) MCG/ACT IN AERS: 2.0000 | INHALATION_SPRAY | Freq: Four times a day (QID) | RESPIRATORY_TRACT | 2 refills | Status: DC | PRN

## 2023-12-25 NOTE — Care Management Important Message (Signed)
Important Message  Patient Details  Name: Peter [name] MRN: [id number] Date of Birth: Aug 16, 1946   Important Message Given:  Yes - Medicare IM     Renie Ora 12/25/2023, 12:18 PM

## 2023-12-25 NOTE — Discharge Summary (Signed)
 Physician Discharge Summary  Peter [name] ZOX:[id number] DOB: 28-Jun-1946 DOA: 12/19/2023  PCP: Darrow Bussing, MD  Admit date: 12/19/2023 Discharge date: 12/25/2023  Admitted From: Home Disposition: Home  Recommendations for Outpatient Follow-up:  Follow up with PCP in 1-2 weeks Please obtain BMP/CBC in one week Will send referral to pulmonary for follow-up.  Will need a follow-up CT scan.  Home Health: N/A Equipment/Devices: N/A  Discharge Condition: Stable. CODE STATUS: Full code Diet recommendation: Low-salt diet  Discharge summary: 78 year old with history of hypertension, coronary artery disease, GERD, CKD stage IIIa, chronic A-fib on Xarelto, chronic systolic heart failure presented with about 5 days of shortness of breath and wheezing.  He was found to have influenza A infection.  He was admitted due to significant symptoms and hypoxemia.  Patient was getting treated symptomatically, he continues to be short of breath.  On 12/22/23, a CT scan showed consolidative pneumonia.  MRSA swab positive.  Patient was treated for following conditions.  Acute influenza A infection with pneumonia, postviral pneumonia with hypoxemia.   CT scan 2/10, consolidation right apex and superior segment of the right lower lobe, central cavitation and bronchiectasis.  Patient does have a history of interstitial lung disease and subpleural scarring.  Possible mucous plugging/postviral pneumonia. Patient was symptomatically treated.  He was treated with vancomycin and azithromycin.  Blood cultures negative.  Sputum cultures were negative.  Legionella and streptococcal antigen negative. Did very good response to the treatment. MRSA PCR positive. Initially on oxygen.  Now on room air.  He did have transient drop in oxygen while walking in the hallway but will improve with slowing down.  He did not want to go home with oxygen. Completed 5 days of Tamiflu. Due to good clinical recovery, patient will be  discharged home with 10 days of doxycycline for consolidative pneumonia.  Given severity of symptoms and abnormal CT scan, he will need a follow-up CT scan as well as evaluation by pulmonary whether he needs a bronchoscopy.  This will be done after completing course of antibiotics. Referral made to pulmonary.   Chronic combined heart failure: clinically euvolemic.  Repeat echocardiogram is stable.   Chronic medical issues including GERD, on PPI CKD stage IIIa, creatinine is at about baseline. Anemia of chronic disease, fairly stable. Chronic A-fib on Xarelto, currently sinus rhythm.  Therapeutic on Xarelto. Coronary artery disease, on Lipitor, Coreg, hydralazine, lisinopril.  Medically stable to discharge with outpatient follow-up.     Discharge Diagnoses:  Principal Problem:   Influenza A Active Problems:   Sepsis (HCC)   Coronary artery disease due to lipid rich plaque   AF (atrial fibrillation) (HCC)   Hypertension   Anemia   CKD (chronic kidney disease), stage III (HCC)   GERD (gastroesophageal reflux disease)   Acute respiratory failure with hypoxia [facility])    Discharge Instructions  Discharge Instructions     Ambulatory referral to Pulmonology   Complete by: As directed    Reason for referral: Interstitial Lung Disease(ILD)   Call MD for:  difficulty breathing, headache or visual disturbances   Complete by: As directed    Call MD for:  temperature >100.4   Complete by: As directed    Diet - low sodium heart healthy   Complete by: As directed    Discharge instructions   Complete by: As directed    Continue to use incentive spirometry and breathing exercises at home   Increase activity slowly   Complete by: As directed  Allergies as of 12/25/2023       Reactions   Spironolactone    Tender breast        Medication List     TAKE these medications    albuterol 108 (90 Base) MCG/ACT inhaler Commonly known as: VENTOLIN HFA Inhale 2 puffs into the  lungs every 6 (six) hours as needed for wheezing or shortness of breath (intractable cough).   allopurinol 300 MG tablet Commonly known as: ZYLOPRIM Take 300 mg by mouth daily.   allopurinol 100 MG tablet Commonly known as: ZYLOPRIM Take 100 mg by mouth at bedtime.   atorvastatin 40 MG tablet Commonly known as: LIPITOR TAKE 1 TABLET EVERY DAY   carvedilol 25 MG tablet Commonly known as: COREG TAKE 1 TABLET TWICE DAILY WITH MEALS   dextromethorphan-guaiFENesin 30-600 MG 12hr tablet Commonly known as: MUCINEX DM Take 1 tablet by mouth 2 (two) times daily for 10 days.   diphenhydramine-acetaminophen 25-500 MG Tabs tablet Commonly known as: TYLENOL PM Take 1 tablet by mouth at bedtime.   doxycycline 100 MG tablet Commonly known as: VIBRA-TABS Take 1 tablet (100 mg total) by mouth every 12 (twelve) hours for 10 days.   ferrous sulfate 325 (65 FE) MG tablet Take 1 tablet (325 mg total) by mouth every morning.   furosemide 40 MG tablet Commonly known as: LASIX TAKE 1 TABLET EVERY DAY   lisinopril 40 MG tablet Commonly known as: ZESTRIL TAKE 1 TABLET EVERY DAY   nitroGLYCERIN 0.4 MG SL tablet Commonly known as: NITROSTAT Place 1 tablet (0.4 mg total) under the tongue every 5 (five) minutes as needed for chest pain. X 3 doses   pantoprazole 40 MG tablet Commonly known as: PROTONIX TAKE 1 TABLET EVERY DAY (NEED MD APPOINTMENT)   polyvinyl alcohol 1.4 % ophthalmic solution Commonly known as: LIQUIFILM TEARS Place 1 drop into both eyes daily.   rivaroxaban 20 MG Tabs tablet Commonly known as: Xarelto Take 1 tablet (20 mg total) by mouth daily. What changed: when to take this   sertraline 25 MG tablet Commonly known as: ZOLOFT Take 25 mg by mouth daily.        Allergies  Allergen Reactions   Spironolactone     Tender breast    Consultations: None   Procedures/Studies: ECHOCARDIOGRAM COMPLETE Result Date: 12/23/2023    ECHOCARDIOGRAM REPORT   Patient  Name:   Peter [name] Date of Exam: 12/23/2023 Medical Rec #:  [id number]        Height:       68.0 in Accession #:    [id number]       Weight:       216.0 lb Date of Birth:  03-Oct-1946        BSA:          2.112 m Patient Age:    78 years         BP:           132/74 mmHg Patient Gender: M                HR:           63 bpm. Exam Location:  Inpatient Procedure: 2D Echo, Cardiac Doppler, Color Doppler and Intracardiac            Opacification Agent Indications:    Dyspnea  History:        Patient has prior history of Echocardiogram examinations, most  recent 12/01/2016. CAD; Risk Factors:Hypertension.  Sonographer:    Karma Ganja Referring Phys: [id number] [facility]  Sonographer Comments: Technically difficult study due to poor echo windows and patient is obese. Image acquisition challenging due to patient body habitus. IMPRESSIONS  1. Left ventricular ejection fraction, by estimation, is 55 to 60%. The left ventricle has normal function. The left ventricle demonstrates regional wall motion abnormalities (see scoring diagram/findings for description). There is moderate concentric left ventricular hypertrophy. Left ventricular diastolic parameters are consistent with Grade II diastolic dysfunction (pseudonormalization).  2. Right ventricular systolic function is normal. The right ventricular size is moderately enlarged. There is mildly elevated pulmonary artery systolic pressure. The estimated right ventricular systolic pressure is 41.7 mmHg.  3. The mitral valve is grossly normal. Trivial mitral valve regurgitation. No evidence of mitral stenosis.  4. The aortic valve is tricuspid. Aortic valve regurgitation is not visualized. No aortic stenosis is present.  5. The inferior vena cava is normal in size with greater than 50% respiratory variability, suggesting right atrial pressure of 3 mmHg. FINDINGS  Left Ventricle: Left ventricular ejection fraction, by estimation, is 55 to 60%. The left ventricle  has normal function. The left ventricle demonstrates regional wall motion abnormalities. Definity contrast agent was given IV to delineate the left ventricular endocardial borders. The left ventricular internal cavity size was normal in size. There is moderate concentric left ventricular hypertrophy. Left ventricular diastolic parameters are consistent with Grade II diastolic dysfunction (pseudonormalization).  LV Wall Scoring: The basal inferolateral segment is hypokinetic. Right Ventricle: The right ventricular size is moderately enlarged. No increase in right ventricular wall thickness. Right ventricular systolic function is normal. There is mildly elevated pulmonary artery systolic pressure. The tricuspid regurgitant velocity is 3.11 m/s, and with an assumed right atrial pressure of 3 mmHg, the estimated right ventricular systolic pressure is 41.7 mmHg. Left Atrium: Left atrial size was not well visualized. Right Atrium: Right atrial size was not well visualized. Pericardium: There is no evidence of pericardial effusion. Mitral Valve: The mitral valve is grossly normal. Trivial mitral valve regurgitation. No evidence of mitral valve stenosis. MV peak gradient, 5.3 mmHg. The mean mitral valve gradient is 2.0 mmHg. Tricuspid Valve: The tricuspid valve is grossly normal. Tricuspid valve regurgitation is trivial. No evidence of tricuspid stenosis. Aortic Valve: The aortic valve is tricuspid. Aortic valve regurgitation is not visualized. No aortic stenosis is present. Aortic valve mean gradient measures 4.0 mmHg. Aortic valve peak gradient measures 8.0 mmHg. Aortic valve area, by VTI measures 1.94 cm. Pulmonic Valve: The pulmonic valve was grossly normal. Pulmonic valve regurgitation is not visualized. No evidence of pulmonic stenosis. Aorta: The aortic root is normal in size and structure. Venous: The inferior vena cava is normal in size with greater than 50% respiratory variability, suggesting right atrial  pressure of 3 mmHg. IAS/Shunts: The interatrial septum was not well visualized.  LEFT VENTRICLE PLAX 2D LVIDd:         4.90 cm   Diastology LVIDs:         3.10 cm   LV e' medial:    5.55 cm/s LV PW:         1.30 cm   LV E/e' medial:  17.3 LV IVS:        1.40 cm   LV e' lateral:   7.72 cm/s LVOT diam:     2.00 cm   LV E/e' lateral: 12.4 LV SV:         60 LV SV Index:  28 LVOT Area:     3.14 cm  RIGHT VENTRICLE             IVC RV Basal diam:  4.80 cm     IVC diam: 1.60 cm RV S prime:     15.60 cm/s TAPSE (M-mode): 3.3 cm LEFT ATRIUM              Index        RIGHT ATRIUM           Index LA diam:        4.20 cm  1.99 cm/m   RA Area:     25.70 cm LA Vol (A2C):   130.0 ml 61.56 ml/m  RA Volume:   87.50 ml  41.43 ml/m LA Vol (A4C):   78.5 ml  37.17 ml/m LA Biplane Vol: 106.0 ml 50.19 ml/m  AORTIC VALVE AV Area (Vmax):    2.04 cm AV Area (Vmean):   1.92 cm AV Area (VTI):     1.94 cm AV Vmax:           141.00 cm/s AV Vmean:          94.100 cm/s AV VTI:            0.307 m AV Peak Grad:      8.0 mmHg AV Mean Grad:      4.0 mmHg LVOT Vmax:         91.70 cm/s LVOT Vmean:        57.600 cm/s LVOT VTI:          0.190 m LVOT/AV VTI ratio: 0.62  AORTA Ao Root diam: 3.80 cm MITRAL VALVE               TRICUSPID VALVE MV Area (PHT): 4.15 cm    TR Peak grad:   38.7 mmHg MV Area VTI:   1.82 cm    TR Vmax:        311.00 cm/s MV Peak grad:  5.3 mmHg MV Mean grad:  2.0 mmHg    SHUNTS MV Vmax:       1.15 m/s    Systemic VTI:  0.19 m MV Vmean:      60.2 cm/s   Systemic Diam: 2.00 cm MV Decel Time: 183 msec MR Peak grad: 83.2 mmHg MR Vmax:      456.00 cm/s MV E velocity: 95.90 cm/s MV A velocity: 59.20 cm/s MV E/A ratio:  1.62 Lennie Odor MD Electronically signed by Lennie Odor MD Signature Date/Time: 12/23/2023/1:45:08 PM    Final    CT CHEST WO CONTRAST Result Date: 12/22/2023 CLINICAL DATA:  Pneumonia, complication suspected, xray done EXAM: CT CHEST WITHOUT CONTRAST TECHNIQUE: Multidetector CT imaging of the chest was  performed following the standard protocol without IV contrast. RADIATION DOSE REDUCTION: This exam was performed according to the departmental dose-optimization program which includes automated exposure control, adjustment of the mA and/or kV according to patient size and/or use of iterative reconstruction technique. COMPARISON:  12/22/2023, 10/23/2016, 07/30/2022 FINDINGS: Cardiovascular: Unenhanced imaging of the heart demonstrates cardiomegaly without significant pericardial effusion. Normal caliber of the thoracic aorta. Atherosclerosis throughout the aorta and coronary vasculature. Mediastinum/Nodes: Mediastinal lymphadenopathy, measuring up to 15 mm in short axis in the right paratracheal region. Thyroid, trachea, and esophagus are unremarkable. Moderate hiatal hernia. Lungs/Pleura: Masslike consolidation is seen within the right upper lobe and superior segment of the right lower lobe. Right upper lobe consolidation measures up to 5.8 x 3.9 cm, with right lower  lobe consolidation measuring up to 3.9 x 3.9 cm. Both areas demonstrate central cavitation and associated bronchiectasis. Progressive cavitating pneumonia is suspected. Continued follow-up to radiographic resolution is recommended to exclude underlying neoplasm. Patchy ground-glass consolidation within the left lower lobe may reflect hypoventilatory change or pneumonia. Basilar predominant interlobular septal thickening and subpleural scarring is noted, which has progressed since prior study. Trace bilateral pleural effusions. No pneumothorax. Central airways are patent. Upper Abdomen: Calcified gallstone without evidence of acute cholecystitis. Remainder of the upper abdomen is unremarkable. Musculoskeletal: No acute or destructive bony abnormalities. Reconstructed images demonstrate no additional findings. IMPRESSION: 1. Dense areas of masslike consolidation within the right apex and superior segment right lower lobe, with areas of central cavitation  and bronchiectasis. Given clinical presentation, findings are consistent with cavitating or necrotizing pneumonia. However, continued follow-up is recommended to document resolution and exclude underlying neoplasm. 2. Patchy left lower lobe ground-glass consolidation, which may reflect additional focus of pneumonia versus hypoventilatory change. 3. Basilar predominant interstitial lung disease, with progressive subpleural scarring since previous exams. 4. Trace bilateral pleural effusions. 5. Mediastinal lymphadenopathy. 6. Cardiomegaly. 7. Moderate hiatal hernia. 8. Incidental cholelithiasis without evidence of acute cholecystitis. 9. Aortic Atherosclerosis (ICD10-I70.0). Coronary artery atherosclerosis. Electronically Signed   By: Sharlet Salina M.D.   On: 12/22/2023 21:49   DG CHEST PORT 1 VIEW Result Date: 12/22/2023 CLINICAL DATA:  Shortness of breath fever and cough EXAM: PORTABLE CHEST 1 VIEW COMPARISON:  12/19/2023, 07/16/2016 FINDINGS: Stable enlarged cardiomediastinal silhouette. New mild volume loss at the right upper lobe with increased pleuroparenchymal opacity compared to prior. Possible small right effusion. IMPRESSION: New mild volume loss at the right upper lobe with increased pleuroparenchymal opacity compared to prior, findings could be secondary to atelectasis and or pneumonia with centrally obstructing process not excluded. Contrast-enhanced chest CT is recommended for further assessment. Suspected small right effusion.  Stable cardiomegaly Electronically Signed   By: Jasmine Pang M.D.   On: 12/22/2023 15:00   DG Chest 2 View Result Date: 12/19/2023 CLINICAL DATA:  78 year old male with cough and shortness of breath for 5 days. EXAM: CHEST - 2 VIEW COMPARISON:  Chest radiographs 0955 hours today and earlier. FINDINGS: PA and lateral views at 1126 hours. Most recent prior chest x-ray before today is 2017. Patchy and confluent right upper lung and left lung base opacity is chronic to a  degree but appears progressed since 2017, unchanged from earlier today. Cardiac CT in 2017 with evidence of interstitial lung disease at that time. Cardiomegaly. Gastric hiatal hernia on the 2017 CT also. Other mediastinal contours are within normal limits. Visualized tracheal air column is within normal limits. No pneumothorax. No pleural effusion. No pulmonary edema suspected. No acute osseous abnormality identified. Negative visible bowel gas. 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 Favor chronic pulmonary fibrosis. Superimposed cardiomegaly, chronic hiatal hernia. No definite acute cardiopulmonary abnormality. Electronically Signed   By: Odessa Fleming M.D.   On: 12/19/2023 12:25   (Echo, Carotid, EGD, Colonoscopy, ERCP)    Subjective: Patient seen in the morning rounds.  Minimal dry cough present.  Eager to go home.  When he walks slowly, he has no issue about dropping oxygen. I explained to him about his CT scan findings and need for follow-up.    Discharge Exam: Vitals:   12/25/23 0756 12/25/23 0921  BP:  133/79  Pulse: (!) 58   Resp: 16   Temp:    SpO2: 96%  Vitals:   12/25/23 0401 12/25/23 0754 12/25/23 0756 12/25/23 0921  BP: 126/81 133/79  133/79  Pulse: (!) 57 61 (!) 58   Resp:   16   Temp: (!) 97.5 F (36.4 C) 97.7 F (36.5 C)    TempSrc: Oral Oral    SpO2: 93% 96% 96%   Weight:      Height:        General: Pt is alert, awake, not in acute distress.  On room air. Cardiovascular: RRR, S1/S2 +, no rubs, no gallops Respiratory: CTA bilaterally, no wheezing, no rhonchi Abdominal: Soft, NT, ND, bowel sounds + Extremities: no edema, no cyanosis    The results of significant diagnostics from this hospitalization (including imaging, microbiology, ancillary and laboratory) are listed below for reference.     Microbiology: Recent Results (from the past 240 hours)  Resp panel by RT-PCR  (RSV, Flu A&B, Covid) Anterior Nasal Swab     Status: Abnormal   Collection Time: 12/19/23 11:17 AM   Specimen: Anterior Nasal Swab  Result Value Ref Range Status   SARS Coronavirus 2 by RT PCR NEGATIVE NEGATIVE Final   Influenza A by PCR POSITIVE (A) NEGATIVE Final   Influenza B by PCR NEGATIVE NEGATIVE Final    Comment: (NOTE) The Xpert Xpress SARS-CoV-2/FLU/RSV plus assay is intended as an aid in the diagnosis of influenza from Nasopharyngeal swab specimens and should not be used as a sole basis for treatment. Nasal washings and aspirates are unacceptable for Xpert Xpress SARS-CoV-2/FLU/RSV testing.  Fact Sheet for Patients: BloggerCourse.com  Fact Sheet for Healthcare Providers: SeriousBroker.it  This test is not yet approved or cleared by the [place] FDA and has been authorized for detection and/or diagnosis of SARS-CoV-2 by FDA under an Emergency Use Authorization (EUA). This EUA will remain in effect (meaning this test can be used) for the duration of the COVID-19 declaration under Section 564(b)(1) of the Act, 21 U.S.C. section 360bbb-3(b)(1), unless the authorization is terminated or revoked.     Resp Syncytial Virus by PCR NEGATIVE NEGATIVE Final    Comment: (NOTE) Fact Sheet for Patients: BloggerCourse.com  Fact Sheet for Healthcare Providers: SeriousBroker.it  This test is not yet approved or cleared by the [place] FDA and has been authorized for detection and/or diagnosis of SARS-CoV-2 by FDA under an Emergency Use Authorization (EUA). This EUA will remain in effect (meaning this test can be used) for the duration of the COVID-19 declaration under Section 564(b)(1) of the Act, 21 U.S.C. section 360bbb-3(b)(1), unless the authorization is terminated or revoked.  Performed at [facility], [address], [place], [place] [address]   Blood  culture (routine x 2)     Status: None   Collection Time: 12/19/23  2:04 PM   Specimen: BLOOD  Result Value Ref Range Status   Specimen Description BLOOD LEFT ANTECUBITAL  Final   Special Requests   Final    BOTTLES DRAWN AEROBIC AND ANAEROBIC Blood Culture adequate volume   Culture   Final    NO GROWTH 5 DAYS Performed at [facility], [address] [place], [place] [address]    Report Status 12/24/2023 FINAL  Final  Blood culture (routine x 2)     Status: None   Collection Time: 12/19/23  2:35 PM   Specimen: BLOOD RIGHT HAND  Result Value Ref Range Status   Specimen Description BLOOD RIGHT HAND  Final   Special Requests   Final    BOTTLES DRAWN  AEROBIC AND ANAEROBIC Blood Culture results may not be optimal due to an inadequate volume of blood received in culture bottles   Culture   Final    NO GROWTH 5 DAYS Performed at [facility], [address] [place], [place] [address]    Report Status 12/24/2023 FINAL  Final  Expectorated Sputum Assessment w Gram Stain, Rflx to Resp Cult     Status: None   Collection Time: 12/23/23  7:40 AM   Specimen: Expectorated Sputum  Result Value Ref Range Status   Specimen Description EXPECTORATED SPUTUM  Final   Special Requests NONE  Final   Sputum evaluation   Final    THIS SPECIMEN IS ACCEPTABLE FOR SPUTUM CULTURE Performed at [facility], [address], [place], [place] [address]    Report Status 12/23/2023 FINAL  Final  MRSA Next Gen by PCR, Nasal     Status: Abnormal   Collection Time: 12/23/23  7:40 AM   Specimen: Nasal Mucosa; Nasal Swab  Result Value Ref Range Status   MRSA by PCR Next Gen DETECTED (A) NOT DETECTED Final    Comment: RESULT CALLED TO, READ BACK BY AND VERIFIED WITH: RN KIM D [id number] FCP (NOTE) The GeneXpert MRSA Assay (FDA approved for NASAL specimens only), is one component of a comprehensive MRSA colonization surveillance program. It is not intended to diagnose MRSA infection nor to  guide or monitor treatment for MRSA infections. Test performance is not FDA approved in patients less than 1 years old. Performed at [facility], [address], [place], [place] [address]   Culture, Respiratory w Gram Stain     Status: None (Preliminary result)   Collection Time: 12/23/23  7:40 AM  Result Value Ref Range Status   Specimen Description EXPECTORATED SPUTUM  Final   Special Requests NONE Reflexed from [id number]  Final   Gram Stain NO WBC SEEN FEW GRAM POSITIVE COCCI IN PAIRS   Final   Culture   Final    CULTURE REINCUBATED FOR BETTER GROWTH Performed at [facility], [address], [place], [place] [address]    Report Status PENDING  Incomplete  Culture, blood (routine x 2) Call MD if unable to obtain prior to antibiotics being given     Status: None (Preliminary result)   Collection Time: 12/23/23  8:08 AM   Specimen: BLOOD LEFT ARM  Result Value Ref Range Status   Specimen Description BLOOD LEFT ARM  Final   Special Requests   Final    BOTTLES DRAWN AEROBIC AND ANAEROBIC Blood Culture adequate volume   Culture   Final    NO GROWTH 2 DAYS Performed at [facility], [address], [place], [place] [address]    Report Status PENDING  Incomplete  Culture, blood (routine x 2) Call MD if unable to obtain prior to antibiotics being given     Status: None (Preliminary result)   Collection Time: 12/23/23  8:08 AM   Specimen: BLOOD LEFT HAND  Result Value Ref Range Status   Specimen Description BLOOD LEFT HAND  Final   Special Requests   Final    BOTTLES DRAWN AEROBIC AND ANAEROBIC Blood Culture adequate volume   Culture   Final    NO GROWTH 2 DAYS Performed at [facility], [address], [place], [place] [address]    Report Status PENDING  Incomplete     Labs: BNP (last 3 results) Recent Labs  12/19/23 1117  BNP 130.3*   Basic Metabolic Panel: Recent Labs  Lab 12/19/23 1117 12/21/23 0514 12/23/23 0808  NA 139 139 140  K  4.1 3.8 4.0  CL 103 102 106  CO2 24 23 22   GLUCOSE 109* 100* 96  BUN 18 27* 29*  CREATININE 1.35* 1.34* 1.40*  CALCIUM 9.1 9.0 8.9  MG  --   --  2.0   Liver Function Tests: Recent Labs  Lab 12/19/23 1520 12/21/23 0514 12/23/23 0808  AST 35 35 24  ALT 18 18 18   ALKPHOS 83 82 73  BILITOT 0.7 0.6 0.7  PROT 6.7 6.8 6.6  ALBUMIN 3.4* 3.4* 3.6   No results for input(s): "LIPASE", "AMYLASE" in the last 168 hours. No results for input(s): "AMMONIA" in the last 168 hours. CBC: Recent Labs  Lab 12/19/23 1117 12/21/23 0514 12/23/23 0808  WBC 7.9 5.5 5.4  NEUTROABS 6.8  --  3.7  HGB 12.6* 11.8* 11.7*  HCT 38.7* 36.8* 35.2*  MCV 103.8* 102.8* 100.0  PLT 144* 148* 149*   Cardiac Enzymes: No results for input(s): "CKTOTAL", "CKMB", "CKMBINDEX", "TROPONINI" in the last 168 hours. BNP: Invalid input(s): "POCBNP" CBG: No results for input(s): "GLUCAP" in the last 168 hours. D-Dimer No results for input(s): "DDIMER" in the last 72 hours. Hgb A1c No results for input(s): "HGBA1C" in the last 72 hours. Lipid Profile No results for input(s): "CHOL", "HDL", "LDLCALC", "TRIG", "CHOLHDL", "LDLDIRECT" in the last 72 hours. Thyroid function studies No results for input(s): "TSH", "T4TOTAL", "T3FREE", "THYROIDAB" in the last 72 hours.  Invalid input(s): "FREET3" Anemia work up No results for input(s): "VITAMINB12", "FOLATE", "FERRITIN", "TIBC", "IRON", "RETICCTPCT" in the last 72 hours. Urinalysis    Component Value Date/Time   COLORURINE YELLOW 07/30/2022 0726   APPEARANCEUR CLEAR 07/30/2022 0726   LABSPEC >1.046 (H) 07/30/2022 0726   PHURINE 5.0 07/30/2022 0726   GLUCOSEU NEGATIVE 07/30/2022 0726   HGBUR SMALL (A) 07/30/2022 0726   BILIRUBINUR NEGATIVE 07/30/2022 0726   KETONESUR NEGATIVE 07/30/2022 0726   PROTEINUR NEGATIVE 07/30/2022 0726   NITRITE NEGATIVE 07/30/2022 0726   LEUKOCYTESUR NEGATIVE 07/30/2022 0726   Sepsis Labs Recent Labs  Lab 12/19/23 1117  12/21/23 0514 12/23/23 0808  WBC 7.9 5.5 5.4   Microbiology Recent Results (from the past 240 hours)  Resp panel by RT-PCR (RSV, Flu A&B, Covid) Anterior Nasal Swab     Status: Abnormal   Collection Time: 12/19/23 11:17 AM   Specimen: Anterior Nasal Swab  Result Value Ref Range Status   SARS Coronavirus 2 by RT PCR NEGATIVE NEGATIVE Final   Influenza A by PCR POSITIVE (A) NEGATIVE Final   Influenza B by PCR NEGATIVE NEGATIVE Final    Comment: (NOTE) The Xpert Xpress SARS-CoV-2/FLU/RSV plus assay is intended as an aid in the diagnosis of influenza from Nasopharyngeal swab specimens and should not be used as a sole basis for treatment. Nasal washings and aspirates are unacceptable for Xpert Xpress SARS-CoV-2/FLU/RSV testing.  Fact Sheet for Patients: BloggerCourse.com  Fact Sheet for Healthcare Providers: SeriousBroker.it  This test is not yet approved or cleared by the [place] FDA and has been authorized for detection and/or diagnosis of SARS-CoV-2 by FDA under an Emergency Use Authorization (EUA). This EUA will remain in effect (meaning this test can be used) for the duration of the COVID-19 declaration under Section 564(b)(1) of the Act, 21 U.S.C. section 360bbb-3(b)(1), unless the authorization is terminated or revoked.     Resp Syncytial Virus by PCR  NEGATIVE NEGATIVE Final    Comment: (NOTE) Fact Sheet for Patients: BloggerCourse.com  Fact Sheet for Healthcare Providers: SeriousBroker.it  This test is not yet approved or cleared by the [place] FDA and has been authorized for detection and/or diagnosis of SARS-CoV-2 by FDA under an Emergency Use Authorization (EUA). This EUA will remain in effect (meaning this test can be used) for the duration of the COVID-19 declaration under Section 564(b)(1) of the Act, 21 U.S.C. section 360bbb-3(b)(1), unless the  authorization is terminated or revoked.  Performed at [facility], [address], [place], [place] [address]   Blood culture (routine x 2)     Status: None   Collection Time: 12/19/23  2:04 PM   Specimen: BLOOD  Result Value Ref Range Status   Specimen Description BLOOD LEFT ANTECUBITAL  Final   Special Requests   Final    BOTTLES DRAWN AEROBIC AND ANAEROBIC Blood Culture adequate volume   Culture   Final    NO GROWTH 5 DAYS Performed at [facility], [address], [place], [place] [address]    Report Status 12/24/2023 FINAL  Final  Blood culture (routine x 2)     Status: None   Collection Time: 12/19/23  2:35 PM   Specimen: BLOOD RIGHT HAND  Result Value Ref Range Status   Specimen Description BLOOD RIGHT HAND  Final   Special Requests   Final    BOTTLES DRAWN AEROBIC AND ANAEROBIC Blood Culture results may not be optimal due to an inadequate volume of blood received in culture bottles   Culture   Final    NO GROWTH 5 DAYS Performed at [facility], [address] [place], [place] [address]    Report Status 12/24/2023 FINAL  Final  Expectorated Sputum Assessment w Gram Stain, Rflx to Resp Cult     Status: None   Collection Time: 12/23/23  7:40 AM   Specimen: Expectorated Sputum  Result Value Ref Range Status   Specimen Description EXPECTORATED SPUTUM  Final   Special Requests NONE  Final   Sputum evaluation   Final    THIS SPECIMEN IS ACCEPTABLE FOR SPUTUM CULTURE Performed at [facility], [address] [place], [place] [address]    Report Status 12/23/2023 FINAL  Final  MRSA Next Gen by PCR, Nasal     Status: Abnormal   Collection Time: 12/23/23  7:40 AM   Specimen: Nasal Mucosa; Nasal Swab  Result Value Ref Range Status   MRSA by PCR Next Gen DETECTED (A) NOT DETECTED Final    Comment: RESULT CALLED TO, READ BACK BY AND VERIFIED WITH: RN KIM D [id number] FCP (NOTE) The GeneXpert MRSA Assay (FDA approved for NASAL specimens  only), is one component of a comprehensive MRSA colonization surveillance program. It is not intended to diagnose MRSA infection nor to guide or monitor treatment for MRSA infections. Test performance is not FDA approved in patients less than 38 years old. Performed at [facility], [address], [place], [place] [address]   Culture, Respiratory w Gram Stain     Status: None (Preliminary result)   Collection Time: 12/23/23  7:40 AM  Result Value Ref Range Status   Specimen Description EXPECTORATED SPUTUM  Final   Special Requests NONE Reflexed from [id number]  Final   Gram Stain NO WBC SEEN FEW GRAM POSITIVE COCCI IN PAIRS   Final   Culture   Final    CULTURE REINCUBATED  FOR BETTER GROWTH Performed at [facility], [address] [place], [place] [address]    Report Status PENDING  Incomplete  Culture, blood (routine x 2) Call MD if unable to obtain prior to antibiotics being given     Status: None (Preliminary result)   Collection Time: 12/23/23  8:08 AM   Specimen: BLOOD LEFT ARM  Result Value Ref Range Status   Specimen Description BLOOD LEFT ARM  Final   Special Requests   Final    BOTTLES DRAWN AEROBIC AND ANAEROBIC Blood Culture adequate volume   Culture   Final    NO GROWTH 2 DAYS Performed at [facility], [address], [place], [place] [address]    Report Status PENDING  Incomplete  Culture, blood (routine x 2) Call MD if unable to obtain prior to antibiotics being given     Status: None (Preliminary result)   Collection Time: 12/23/23  8:08 AM   Specimen: BLOOD LEFT HAND  Result Value Ref Range Status   Specimen Description BLOOD LEFT HAND  Final   Special Requests   Final    BOTTLES DRAWN AEROBIC AND ANAEROBIC Blood Culture adequate volume   Culture   Final    NO GROWTH 2 DAYS Performed at [facility], [address] [place], [place] [address]    Report Status PENDING  Incomplete     Time coordinating discharge:  35  minutes  SIGNED:   Dorcas Carrow, MD  [facility] 12/25/2023, 10:38 AM

## 2023-12-25 NOTE — Progress Notes (Signed)
Mobility Specialist Progress Note:   12/25/23 1054  Mobility  Activity Ambulated independently in hallway  Level of Assistance Independent after set-up  Assistive Device None  Distance Ambulated (ft) 1110 ft  Activity Response Tolerated well  Mobility Referral Yes  Mobility visit 1 Mobility  Mobility Specialist Start Time (ACUTE ONLY) 0924  Mobility Specialist Stop Time (ACUTE ONLY) 0935  Mobility Specialist Time Calculation (min) (ACUTE ONLY) 11 min   Pre Mobility: 89 HR , 96% SpO2 RA During Mobility: 112 HR , 83%-94% SpO2 RA Post Mobility: 94 HR , 96% SpO2 RA  Pt received in bed, agreeable to mobility. Desat to 83% on RA but with pursed lip breathing quickly elevated to 94%. Pt c/o slight SOB, otherwise asx throughout. Pt returned to bed with call bell in reach and all need met.   Leory Plowman  Mobility Specialist Please contact via [employer] office at [phone]

## 2023-12-28 LAB — CULTURE, BLOOD (ROUTINE X 2)
Culture: NO GROWTH
Culture: NO GROWTH
Special Requests: ADEQUATE
Special Requests: ADEQUATE

## 2024-01-05 DIAGNOSIS — J189 Pneumonia, unspecified organism: Secondary | ICD-10-CM | POA: Diagnosis not present

## 2024-01-06 DIAGNOSIS — J189 Pneumonia, unspecified organism: Secondary | ICD-10-CM | POA: Diagnosis not present

## 2024-01-09 DIAGNOSIS — J189 Pneumonia, unspecified organism: Secondary | ICD-10-CM | POA: Diagnosis not present

## 2024-01-12 DIAGNOSIS — Z79899 Other long term (current) drug therapy: Secondary | ICD-10-CM | POA: Diagnosis not present

## 2024-01-12 DIAGNOSIS — J189 Pneumonia, unspecified organism: Secondary | ICD-10-CM | POA: Diagnosis not present

## 2024-01-19 ENCOUNTER — Other Ambulatory Visit: Payer: Self-pay | Admitting: Family Medicine

## 2024-01-19 DIAGNOSIS — J984 Other disorders of lung: Secondary | ICD-10-CM

## 2024-01-22 ENCOUNTER — Other Ambulatory Visit: Payer: Self-pay

## 2024-01-22 ENCOUNTER — Observation Stay (HOSPITAL_COMMUNITY)
Admission: EM | Admit: 2024-01-22 | Discharge: 2024-01-23 | Disposition: A | Discharge status: Home / Self Care | Attending: Internal Medicine | Admitting: Internal Medicine

## 2024-01-22 ENCOUNTER — Encounter (HOSPITAL_COMMUNITY): Payer: Self-pay

## 2024-01-22 ENCOUNTER — Emergency Department (HOSPITAL_COMMUNITY)

## 2024-01-22 DIAGNOSIS — E1165 Type 2 diabetes mellitus with hyperglycemia: Secondary | ICD-10-CM | POA: Diagnosis not present

## 2024-01-22 DIAGNOSIS — I5042 Chronic combined systolic (congestive) and diastolic (congestive) heart failure: Secondary | ICD-10-CM | POA: Insufficient documentation

## 2024-01-22 DIAGNOSIS — I4891 Unspecified atrial fibrillation: Secondary | ICD-10-CM | POA: Diagnosis not present

## 2024-01-22 DIAGNOSIS — J168 Pneumonia due to other specified infectious organisms: Secondary | ICD-10-CM | POA: Diagnosis not present

## 2024-01-22 DIAGNOSIS — M10072 Idiopathic gout, left ankle and foot: Secondary | ICD-10-CM | POA: Diagnosis not present

## 2024-01-22 DIAGNOSIS — J189 Pneumonia, unspecified organism: Secondary | ICD-10-CM

## 2024-01-22 DIAGNOSIS — K219 Gastro-esophageal reflux disease without esophagitis: Secondary | ICD-10-CM | POA: Diagnosis present

## 2024-01-22 DIAGNOSIS — R0602 Shortness of breath: Secondary | ICD-10-CM | POA: Diagnosis not present

## 2024-01-22 DIAGNOSIS — R739 Hyperglycemia, unspecified: Secondary | ICD-10-CM | POA: Diagnosis not present

## 2024-01-22 DIAGNOSIS — G4733 Obstructive sleep apnea (adult) (pediatric): Secondary | ICD-10-CM | POA: Insufficient documentation

## 2024-01-22 DIAGNOSIS — Z955 Presence of coronary angioplasty implant and graft: Secondary | ICD-10-CM | POA: Diagnosis not present

## 2024-01-22 DIAGNOSIS — I2510 Atherosclerotic heart disease of native coronary artery without angina pectoris: Secondary | ICD-10-CM | POA: Diagnosis not present

## 2024-01-22 DIAGNOSIS — Z7901 Long term (current) use of anticoagulants: Secondary | ICD-10-CM | POA: Diagnosis not present

## 2024-01-22 DIAGNOSIS — R590 Localized enlarged lymph nodes: Secondary | ICD-10-CM | POA: Diagnosis not present

## 2024-01-22 DIAGNOSIS — I130 Hypertensive heart and chronic kidney disease with heart failure and stage 1 through stage 4 chronic kidney disease, or unspecified chronic kidney disease: Principal | ICD-10-CM | POA: Insufficient documentation

## 2024-01-22 DIAGNOSIS — I509 Heart failure, unspecified: Secondary | ICD-10-CM

## 2024-01-22 DIAGNOSIS — Z79899 Other long term (current) drug therapy: Secondary | ICD-10-CM | POA: Insufficient documentation

## 2024-01-22 DIAGNOSIS — Z1152 Encounter for screening for COVID-19: Secondary | ICD-10-CM | POA: Diagnosis not present

## 2024-01-22 DIAGNOSIS — Z96641 Presence of right artificial hip joint: Secondary | ICD-10-CM | POA: Diagnosis not present

## 2024-01-22 DIAGNOSIS — I10 Essential (primary) hypertension: Secondary | ICD-10-CM | POA: Diagnosis not present

## 2024-01-22 DIAGNOSIS — I4819 Other persistent atrial fibrillation: Secondary | ICD-10-CM | POA: Insufficient documentation

## 2024-01-22 DIAGNOSIS — I480 Paroxysmal atrial fibrillation: Secondary | ICD-10-CM | POA: Diagnosis present

## 2024-01-22 DIAGNOSIS — M10071 Idiopathic gout, right ankle and foot: Secondary | ICD-10-CM | POA: Diagnosis not present

## 2024-01-22 DIAGNOSIS — R918 Other nonspecific abnormal finding of lung field: Secondary | ICD-10-CM | POA: Diagnosis not present

## 2024-01-22 DIAGNOSIS — J984 Other disorders of lung: Secondary | ICD-10-CM | POA: Diagnosis not present

## 2024-01-22 DIAGNOSIS — M109 Gout, unspecified: Secondary | ICD-10-CM | POA: Insufficient documentation

## 2024-01-22 DIAGNOSIS — I160 Hypertensive urgency: Secondary | ICD-10-CM | POA: Diagnosis present

## 2024-01-22 DIAGNOSIS — N183 Chronic kidney disease, stage 3 unspecified: Secondary | ICD-10-CM | POA: Diagnosis present

## 2024-01-22 LAB — COMPREHENSIVE METABOLIC PANEL
ALT: 26 U/L (ref 0–44)
AST: 26 U/L (ref 15–41)
Albumin: 3.8 g/dL (ref 3.5–5.0)
Alkaline Phosphatase: 80 U/L (ref 38–126)
Anion gap: 18 — ABNORMAL HIGH (ref 5–15)
BUN: 25 mg/dL — ABNORMAL HIGH (ref 8–23)
CO2: 20 mmol/L — ABNORMAL LOW (ref 22–32)
Calcium: 9.2 mg/dL (ref 8.9–10.3)
Chloride: 97 mmol/L — ABNORMAL LOW (ref 98–111)
Creatinine, Ser: 1.27 mg/dL — ABNORMAL HIGH (ref 0.61–1.24)
GFR, Estimated: 58 mL/min — ABNORMAL LOW (ref >60)
Glucose, Bld: 523 mg/dL (ref 70–99)
Potassium: 4.4 mmol/L (ref 3.5–5.1)
Sodium: 135 mmol/L (ref 135–145)
Total Bilirubin: 0.8 mg/dL (ref 0.0–1.2)
Total Protein: 6.8 g/dL (ref 6.5–8.1)

## 2024-01-22 LAB — CBC WITH DIFFERENTIAL/PLATELET
Abs Immature Granulocytes: 0.13 10*3/uL — ABNORMAL HIGH (ref 0.00–0.07)
Basophils Absolute: 0 10*3/uL (ref 0.0–0.1)
Basophils Relative: 0 %
Eosinophils Absolute: 0 10*3/uL (ref 0.0–0.5)
Eosinophils Relative: 0 %
HCT: 34.2 % — ABNORMAL LOW (ref 39.0–52.0)
Hemoglobin: 11.2 g/dL — ABNORMAL LOW (ref 13.0–17.0)
Immature Granulocytes: 1 %
Lymphocytes Relative: 5 %
Lymphs Abs: 0.6 10*3/uL — ABNORMAL LOW (ref 0.7–4.0)
MCH: 33.5 pg (ref 26.0–34.0)
MCHC: 32.7 g/dL (ref 30.0–36.0)
MCV: 102.4 fL — ABNORMAL HIGH (ref 80.0–100.0)
Monocytes Absolute: 0.6 10*3/uL (ref 0.1–1.0)
Monocytes Relative: 5 %
Neutro Abs: 11.7 10*3/uL — ABNORMAL HIGH (ref 1.7–7.7)
Neutrophils Relative %: 89 %
Platelets: 170 10*3/uL (ref 150–400)
RBC: 3.34 MIL/uL — ABNORMAL LOW (ref 4.22–5.81)
RDW: 15 % (ref 11.5–15.5)
WBC: 13 10*3/uL — ABNORMAL HIGH (ref 4.0–10.5)
nRBC: 0 % (ref 0.0–0.2)

## 2024-01-22 LAB — TROPONIN I (HIGH SENSITIVITY)
Troponin I (High Sensitivity): 17 ng/L (ref <18)
Troponin I (High Sensitivity): 24 ng/L — ABNORMAL HIGH (ref <18)

## 2024-01-22 LAB — RESP PANEL BY RT-PCR (RSV, FLU A&B, COVID)  RVPGX2
Influenza A by PCR: NEGATIVE
Influenza B by PCR: NEGATIVE
Resp Syncytial Virus by PCR: NEGATIVE
SARS Coronavirus 2 by RT PCR: NEGATIVE

## 2024-01-22 LAB — CBG MONITORING, ED: Glucose-Capillary: 375 mg/dL — ABNORMAL HIGH (ref 70–99)

## 2024-01-22 MED ORDER — LEVOFLOXACIN IN D5W 750 MG/150ML IV SOLN
750.0000 mg | Freq: Once | INTRAVENOUS | Status: AC
  Administered 2024-01-22: 750 mg via INTRAVENOUS
  Filled 2024-01-22: qty 150

## 2024-01-22 MED ORDER — INSULIN ASPART 100 UNIT/ML IV SOLN
10.0000 [IU] | Freq: Once | INTRAVENOUS | Status: AC
  Administered 2024-01-22: 10 [IU] via INTRAVENOUS

## 2024-01-22 MED ORDER — SODIUM CHLORIDE 0.9 % IV BOLUS
1000.0000 mL | Freq: Once | INTRAVENOUS | Status: AC
  Administered 2024-01-22: 1000 mL via INTRAVENOUS

## 2024-01-22 MED ORDER — IOHEXOL 350 MG/ML SOLN
75.0000 mL | Freq: Once | INTRAVENOUS | Status: AC | PRN
  Administered 2024-01-22: 75 mL via INTRAVENOUS

## 2024-01-22 NOTE — ED Triage Notes (Addendum)
 Pt reports sudden onset of weakness and shortness of breath today around 1500. He took his BP and it was 180/112, HR-108. He called his PCP and was told to come to the ER. He reports exertional shortness of breath. He denies any pain. He reports he is compliant with his Lisinopril and Carvedilol and all of his other home meds.

## 2024-01-22 NOTE — ED Provider Notes (Signed)
 [facility] AT [facility] Provider Note   CSN: [id number] Arrival date & time: 01/22/24  1807     History  Chief Complaint  Patient presents with   Hypertension   Shortness of Breath    Peter [name] is a 78 y.o. male.   Hypertension Associated symptoms include shortness of breath.  Shortness of Breath  78 year old male with a history of a recent admission to the hospital for influenza and an abnormal CT scan of the chest.  The patient was admitted with hypoxic respiratory failure secondary to influenza, the CT scan at that time showed "dense areas of masslike consolidation within the right apex and superior segment of the right lower lobe.  There was areas of central cavitation and bronchiectasis findings thought to be consistent with a cavitating or necrotizing pneumonia, also a patchy left lower groundglass consolidation, thought to have some progressive interstitial lung disease, since that time the patient has followed up with his family doctor several times, he ultimately was told to come to the hospital today with the dyspnea on exertion that he was having.  He denies fevers or chills, the cough is almost completely gone, there is no swelling of the legs.  The patient has been on prednisone for the last couple of days because of a gout flareup and he does take rivaroxaban as well.    Home Medications Prior to Admission medications   Medication Sig Start Date End Date Taking? Authorizing Provider  albuterol (VENTOLIN HFA) 108 (90 Base) MCG/ACT inhaler Inhale 2 puffs into the lungs every 6 (six) hours as needed for wheezing or shortness of breath (intractable cough). 12/25/23   Dorcas Carrow, MD  allopurinol (ZYLOPRIM) 100 MG tablet Take 100 mg by mouth at bedtime. 11/19/21   [provider]  allopurinol (ZYLOPRIM) 300 MG tablet Take 300 mg by mouth daily.    [provider]  atorvastatin (LIPITOR) 40 MG tablet TAKE 1 TABLET EVERY  DAY 10/22/23   Camnitz, Andree Coss, MD  carvedilol (COREG) 25 MG tablet TAKE 1 TABLET TWICE DAILY WITH MEALS 04/17/23   Camnitz, Will Daphine Deutscher, MD  diphenhydramine-acetaminophen (TYLENOL PM) 25-500 MG TABS tablet Take 1 tablet by mouth at bedtime.    [provider]  ferrous sulfate 325 (65 FE) MG tablet Take 1 tablet (325 mg total) by mouth every morning. 10/27/18   Camnitz, Will Daphine Deutscher, MD  furosemide (LASIX) 40 MG tablet TAKE 1 TABLET EVERY DAY 04/17/23   Camnitz, Andree Coss, MD  lisinopril (ZESTRIL) 40 MG tablet TAKE 1 TABLET EVERY DAY 06/10/23   Camnitz, Andree Coss, MD  nitroGLYCERIN (NITROSTAT) 0.4 MG SL tablet Place 1 tablet (0.4 mg total) under the tongue every 5 (five) minutes as needed for chest pain. X 3 doses 04/09/23   Swinyer, Zachary George, NP  pantoprazole (PROTONIX) 40 MG tablet TAKE 1 TABLET EVERY DAY (NEED MD APPOINTMENT) 04/17/23   Camnitz, Andree Coss, MD  polyvinyl alcohol (LIQUIFILM TEARS) 1.4 % ophthalmic solution Place 1 drop into both eyes daily.    [provider]  rivaroxaban (XARELTO) 20 MG TABS tablet Take 1 tablet (20 mg total) by mouth daily. Patient taking differently: Take 20 mg by mouth at bedtime. 12/01/23   Lennette Bihari, MD  sertraline (ZOLOFT) 25 MG tablet Take 25 mg by mouth daily. 06/17/22   [provider]      Allergies    Spironolactone    Review of Systems   Review of Systems  Respiratory:  Positive for shortness of breath.   All other systems reviewed and are negative.   Physical Exam Updated Vital Signs BP (!) 165/107 (BP Location: Right Arm)   Pulse 92   Temp 97.6 F (36.4 C)   Resp 20   Ht 1.727 m (5\' 8" )   Wt 98 kg   SpO2 98%   BMI 32.84 kg/m  Physical Exam Vitals and nursing note reviewed.  Constitutional:      General: He is not in acute distress.    Appearance: He is well-developed.  HENT:     Head: Normocephalic and atraumatic.     Mouth/Throat:     Pharynx: No oropharyngeal exudate.  Eyes:     General:  No scleral icterus.       Right eye: No discharge.        Left eye: No discharge.     Conjunctiva/sclera: Conjunctivae normal.     Pupils: Pupils are equal, round, and reactive to light.  Neck:     Thyroid: No thyromegaly.     Vascular: No JVD.  Cardiovascular:     Rate and Rhythm: Normal rate and regular rhythm.     Heart sounds: Normal heart sounds. No murmur heard.    No friction rub. No gallop.  Pulmonary:     Effort: Pulmonary effort is normal. No respiratory distress.     Breath sounds: Normal breath sounds. No wheezing or rales.  Abdominal:     General: Bowel sounds are normal. There is no distension.     Palpations: Abdomen is soft. There is no mass.     Tenderness: There is no abdominal tenderness.  Musculoskeletal:        General: No tenderness. Normal range of motion.     Cervical back: Normal range of motion and neck supple.     Right lower leg: No tenderness. No edema.     Left lower leg: No tenderness. No edema.  Lymphadenopathy:     Cervical: No cervical adenopathy.  Skin:    General: Skin is warm and dry.     Findings: No erythema or rash.  Neurological:     Mental Status: He is alert.     Coordination: Coordination normal.  Psychiatric:        Behavior: Behavior normal.     ED Results / Procedures / Treatments   Labs (all labs ordered are listed, but only abnormal results are displayed) Labs Reviewed  COMPREHENSIVE METABOLIC PANEL - Abnormal; Notable for the following components:      Result Value   Chloride 97 (*)    CO2 20 (*)    Glucose, Bld 523 (*)    BUN 25 (*)    Creatinine, Ser 1.27 (*)    GFR, Estimated 58 (*)    Anion gap 18 (*)    All other components within normal limits  CBC WITH DIFFERENTIAL/PLATELET - Abnormal; Notable for the following components:   WBC 13.0 (*)    RBC 3.34 (*)    Hemoglobin 11.2 (*)    HCT 34.2 (*)    MCV 102.4 (*)    Neutro Abs 11.7 (*)    Lymphs Abs 0.6 (*)    Abs Immature Granulocytes 0.13 (*)    All  other components within normal limits  CBG MONITORING, ED - Abnormal; Notable for the following components:   Glucose-Capillary 375 (*)    All other components within normal limits  RESP PANEL BY RT-PCR (RSV, FLU A&B, COVID)  RVPGX2  BRAIN NATRIURETIC PEPTIDE  TROPONIN I (HIGH SENSITIVITY)  TROPONIN I (HIGH SENSITIVITY)    EKG None  Radiology No results found.  Procedures Procedures    Medications Ordered in ED Medications  levofloxacin (LEVAQUIN) IVPB 750 mg (750 mg Intravenous New Bag/Given 01/22/24 2228)  iohexol (OMNIPAQUE) 350 MG/ML injection 75 mL (75 mLs Intravenous Contrast Given 01/22/24 2042)  sodium chloride 0.9 % bolus 1,000 mL (1,000 mLs Intravenous New Bag/Given 01/22/24 2226)  insulin aspart (novoLOG) injection 10 Units (10 Units Intravenous Given 01/22/24 2226)    ED Course/ Medical Decision Making/ A&P                                 Medical Decision Making Risk OTC drugs. Prescription drug management. Decision regarding hospitalization.   The patient's heart rate is in the upper 90s, his blood pressure is elevated at 165/107, he is afebrile, has dyspnea on exertion but no chest pain no swelling of the legs, glucose 523, anion gap of 18, CO2 of 20, white blood cell count of 13,000, troponin is essentially normal   This patient presents to the ED for concern of shortness of breath, this involves an extensive number of treatment options, and is a complaint that carries with it a high risk of complications and morbidity.  The differential diagnosis includes pneumonia, COPD, pneumothorax, pulmonary embolism, metabolic abnormalities   Co morbidities that complicate the patient evaluation  Recent admission to the hospital for what appeared to be a necrotizing pneumonia, not known to be a diabetic though he does state that he had a blood sugar of about 147 in the outpatient setting within the last week or 2, this was a fasting measurement   Additional history  obtained:  Additional history obtained from the record External records from outside source obtained and reviewed including extensive workup including recent admission to the hospital with very abnormal CT scan.   Lab Tests:  I Ordered, and personally interpreted labs.  The pertinent results include: Hyperglycemic over 500 with a slight anion gap acidosis, creatinine is 1.27, leukocytosis of 13   Imaging Studies ordered:  I ordered imaging studies including CT scan of the chest I independently visualized and interpreted imaging which showed no obvious pulmonary embolism, ongoing infiltrates present I agree with the radiologist interpretation   Cardiac Monitoring: / EKG:  The patient was maintained on a cardiac monitor.  I personally viewed and interpreted the cardiac monitored which showed an underlying rhythm of: Hypertensive, was tachycardic prehospital but here has a pulse of 95   Consultations Obtained:  I requested consultation with the hospitalist Dr. Toniann Fail,  and discussed lab and imaging findings as well as pertinent plan - they recommend: Admission to the hospital for treatment of the new onset hyperglycemia   Problem List / ED Course / Critical interventions / Medication management  I suspect that the patient had some diabetes but now that he is on prednisone for a few days for his gout it is magnified this and complicated this.  He may have mild DKA but does not appear critically ill, antibiotics and insulin has been ordered I ordered medication including antibiotics and insulin for pneumonia and hyperglycemia Reevaluation of the patient after these medicines showed that the patient unchanged I have reviewed the patients home medicines and have made adjustments as needed   Social Determinants of Health:  New onset diabetes   Test / Admission -  Considered:  To the hospital         Final Clinical Impression(s) / ED Diagnoses Final diagnoses:   Hyperglycemia  Pneumonia of both lungs due to infectious organism, unspecified part of lung    Rx / DC Orders ED Discharge Orders     None         Eber Hong, MD 01/22/24 2235

## 2024-01-22 NOTE — ED Provider Triage Note (Signed)
 Emergency Medicine Provider Triage Evaluation Note  [name] , a 78 y.o. male  was evaluated in triage.  Pt complains of shortness of breath.  He reports he was admitted to the hospital for a week last month with pneumonia and flu.  States that he felt better after being discharged until today he was watching basketball tonight when she started to feel generalized weakness and fatigue.  His blood pressure was elevated at 180/110 and his heart rate was in the 110s.  States that he takes Xarelto 20 mg daily and has not missed any doses.  Review of Systems  Positive: Shortness of breath Negative: Chest pain  Physical Exam  BP (!) 195/102 (BP Location: Right Arm)   Pulse 99   Temp 98.3 F (36.8 C)   Resp (!) 28   Ht 5\' 8"  (1.727 m)   Wt 98 kg   SpO2 96%   BMI 32.84 kg/m  Gen:   Awake, no distress   Resp:  Short of breath after speaking in full sentences, lungs are clear to auscultation MSK:   Moves extremities without difficulty  Other:    Medical Decision Making  Medically screening exam initiated at 6:37 PM.  Appropriate orders placed.  [name] was informed that the remainder of the evaluation will be completed by another provider, this initial triage assessment does not replace that evaluation, and the importance of remaining in the ED until their evaluation is complete.   Maxwell Marion, PA-C 01/22/24 1842

## 2024-01-22 NOTE — H&P (Signed)
 = History and Physical    Peter [name] ZOX:[id number] DOB: 31-Oct-1946 DOA: 01/22/2024  Patient coming from: Home.  Chief Complaint: Elevated blood pressure.  HPI: Peter [name] is a 78 y.o. male with history of chronic CHF, CAD status post PCI, atrial fibrillation status post ablation, sleep apnea, gout, depression recently admitted to the hospital for possible viral pneumonia and was discharged home on 12/25/2023 presents to the ER because patient noticed that his blood pressure was consistently elevated more than 180 systolic.  During recent admission patient's CAT scan did show a cavitary mass in the lung and has follow-up with pulmonologist.  Patient states 2 days ago he had gout attack of his both ankles and was placed on prednisone which patient has taken for the last 2 days and the last dose was a few hours before coming to the ER.  Denies any chest pain or shortness of breath.  Has been taking his antihypertensives as advised.  ED Course: In the ER patient had systolic blood pressures in the 160s to 180s range.  Patient's blood sugar was found to be in 500 range with anion gap of 18.  Patient was given 8 units of insulin subcutaneous and fluid bolus.  CT scan done shows consolidative changes with cavitation in the right upper lobe which has been there previously and has follow-up with pulmonologist.  Troponins are 17, 24 and 33.  Patient denies any chest pain.  Patient admitted for hyperglycemia likely due to recent use of prednisone for gout and uncontrolled hypertension likely could be from prednisone.  Review of Systems: As per HPI, rest all negative.   Past Medical History:  Diagnosis Date   Arthritis    At risk for sleep apnea    STOP-BANG= 5       SENT TO PCP 07-12-2015   CAD (coronary artery disease)    a. s/p PCI with DES x 3 to LAD in 2006 with bifurcation lesion and Plavix indefinitely was recommended.   Chronic combined systolic and diastolic CHF (congestive heart  failure) (HCC)    a. Prior EF normal but EF dropped to 30-35% in 07/2016 in setting of AFIB.   CKD (chronic kidney disease), stage III (HCC)    Depression    Dyslipidemia    Dyspnea    tired quick d/t Afib   Dyspnea on exertion    Dysrhythmia    A.FIB   GERD (gastroesophageal reflux disease)    History of GI bleed    History of kidney stones    History of MI (myocardial infarction)    11-05-2005--  periprocedural MI  (cardiac cath) per dr Juanda Chance note   Hypertension    Hypertriglyceridemia    Iron deficiency anemia    Myocardial infarction [facility])    Persistent atrial fibrillation (HCC)    a. 07/2016, started on Eliquis -> readmitted later that month for AF RVR/acute HF - initially unsuccessful DCCV following TEE, so loaded with amio with repeat successful DCCV 07/2816.   Right ureteral stone    S/P drug eluting coronary stent placement    x3  to LAD  2006   Sleep apnea    wears BIPAP, 14 up to 24    Past Surgical History:  Procedure Laterality Date   CARDIOVASCULAR STRESS TEST  01-06-2013  dr Myrtis Ser   normal perfusion study/  no ischemia or infarct/  normal LV function and wall motion , ef 57%   CARDIOVERSION N/A 08/05/2016   Procedure: CARDIOVERSION;  Surgeon:  Thurmon Fair, MD;  Location: [facility] ENDOSCOPY;  Service: Cardiovascular;  Laterality: N/A;   CARDIOVERSION N/A 08/08/2016   Procedure: CARDIOVERSION;  Surgeon: Jake Bathe, MD;  Location: [facility] ENDOSCOPY;  Service: Cardiovascular;  Laterality: N/A;   CORONARY ANGIOPLASTY  11-13-2005 dr Juanda Chance   Successful touch up post-dilatation of the 3 tandem overlying Cypher stents in proximal to mid LAD (based on IVUS 0% stenosis and patent diagonal branch)/  Cutting Balloon Angioplasty of Ramus branch   CORONARY ANGIOPLASTY WITH STENT PLACEMENT  11-05-2005   dr Smitty Cords brodie   PCI and DES x3 to birfurcation lesion LAD and diagonal branch of LAD/   20% dLM,  30-40% RCA, ef 60%   CYSTOSCOPY WITH RETROGRADE PYELOGRAM, URETEROSCOPY AND STENT  PLACEMENT Right 07/14/2015   Procedure: CYSTOSCOPY WITH RETROGRADE PYELOGRAM, URETEROSCOPY AND STENT PLACEMENT;  Surgeon: Sebastian Ache, MD;  Location: [facility];  Service: Urology;  Laterality: Right;   ELECTROPHYSIOLOGIC STUDY N/A 10/24/2016   Procedure: Atrial Fibrillation Ablation;  Surgeon: Will Jorja Loa, MD;  Location: [facility] INVASIVE CV LAB;  Service: Cardiovascular;  Laterality: N/A;   EXTRACORPOREAL SHOCK WAVE LITHOTRIPSY  yrs ago   HEMORRHOID SURGERY N/A 10/24/2017   Procedure: HEMORRHOIDECTOMY;  Surgeon: Andria Meuse, MD;  Location: [facility] OR;  Service: General;  Laterality: N/A;   HERNIA REPAIR Bilateral 2000   Inguinal Hernia   IR GENERIC HISTORICAL  10/23/2016   IR FLUORO GUIDE CV LINE RIGHT 10/23/2016 Berdine Dance, MD [facility]   IR GENERIC HISTORICAL  10/23/2016   IR US GUIDE VASC ACCESS RIGHT 10/23/2016 Berdine Dance, MD [facility]   LEFT URETEROSCOPIC STONE EXTRACTION  04/10/2006   STONE EXTRACTION WITH BASKET Right 07/14/2015   Procedure: STONE EXTRACTION WITH BASKET;  Surgeon: Sebastian Ache, MD;  Location: [facility];  Service: Urology;  Laterality: Right;   TEE WITHOUT CARDIOVERSION N/A 08/05/2016   Procedure: TRANSESOPHAGEAL ECHOCARDIOGRAM (TEE);  Surgeon: Thurmon Fair, MD;  Location: [facility] ENDOSCOPY;  Service: Cardiovascular;  Laterality: N/A;   TOTAL HIP ARTHROPLASTY Right 07/16/2022   Procedure: RIGHT TOTAL HIP ARTHROPLASTY ANTERIOR APPROACH;  Surgeon: Tarry Kos, MD;  Location: [facility] OR;  Service: Orthopedics;  Laterality: Right;  3-C   TRANSTHORACIC ECHOCARDIOGRAM  02/04/2012   grade I diastolic dysfunction/  ef 55-60%/  trivial MR and TR/  mild LAE     reports that he has never smoked. He has never used smokeless tobacco. He reports that he does not drink alcohol and does not use drugs.  Allergies  Allergen Reactions   Spironolactone     Tender breast    Family History  Problem Relation Age of Onset   Heart  attack Father 73   Congestive Heart Failure Father 1   Cancer Other     Prior to Admission medications   Medication Sig Start Date End Date Taking? Authorizing Provider  albuterol (VENTOLIN HFA) 108 (90 Base) MCG/ACT inhaler Inhale 2 puffs into the lungs every 6 (six) hours as needed for wheezing or shortness of breath (intractable cough). 12/25/23   Dorcas Carrow, MD  allopurinol (ZYLOPRIM) 100 MG tablet Take 100 mg by mouth at bedtime. 11/19/21   [provider]  allopurinol (ZYLOPRIM) 300 MG tablet Take 300 mg by mouth daily.    [provider]  atorvastatin (LIPITOR) 40 MG tablet TAKE 1 TABLET EVERY DAY 10/22/23   Camnitz, Andree Coss, MD  carvedilol (COREG) 25 MG tablet TAKE 1 TABLET TWICE DAILY WITH MEALS 04/17/23   Camnitz, Will  Daphine Deutscher, MD  diphenhydramine-acetaminophen (TYLENOL PM) 25-500 MG TABS tablet Take 1 tablet by mouth at bedtime.    [provider]  ferrous sulfate 325 (65 FE) MG tablet Take 1 tablet (325 mg total) by mouth every morning. 10/27/18   Camnitz, Will Daphine Deutscher, MD  furosemide (LASIX) 40 MG tablet TAKE 1 TABLET EVERY DAY 04/17/23   Camnitz, Andree Coss, MD  lisinopril (ZESTRIL) 40 MG tablet TAKE 1 TABLET EVERY DAY 06/10/23   Camnitz, Andree Coss, MD  nitroGLYCERIN (NITROSTAT) 0.4 MG SL tablet Place 1 tablet (0.4 mg total) under the tongue every 5 (five) minutes as needed for chest pain. X 3 doses 04/09/23   Swinyer, Zachary George, NP  pantoprazole (PROTONIX) 40 MG tablet TAKE 1 TABLET EVERY DAY (NEED MD APPOINTMENT) 04/17/23   Camnitz, Andree Coss, MD  polyvinyl alcohol (LIQUIFILM TEARS) 1.4 % ophthalmic solution Place 1 drop into both eyes daily.    [provider]  rivaroxaban (XARELTO) 20 MG TABS tablet Take 1 tablet (20 mg total) by mouth daily. Patient taking differently: Take 20 mg by mouth at bedtime. 12/01/23   Lennette Bihari, MD  sertraline (ZOLOFT) 25 MG tablet Take 25 mg by mouth daily. 06/17/22   [provider]    Physical  Exam: Constitutional: Moderately built and nourished. Vitals:   01/22/24 1817 01/22/24 2108 01/22/24 2230 01/22/24 2310  BP:  (!) 165/107 (!) 168/93 (!) 160/99  Pulse:  92 83 88  Resp: (!) 28 20 18 18   Temp:  97.6 F (36.4 C)    SpO2:  98% 95% 98%  Weight:      Height:       Eyes: Anicteric no pallor. ENMT: No discharge from the ears eyes nose or mouth. Neck: No mass felt.  No neck rigidity. Respiratory: No rhonchi or crepitations. Cardiovascular: S1-S2 heard. Abdomen: Soft nontender bowel sound present. Musculoskeletal: Ankle does not have any signs of gout at this time. Skin: No erythema at the ankle. Neurologic: Alert awake oriented time place and person.  Moves all extremities. Psychiatric: Appears normal.  Normal affect.   Labs on Admission: I have personally reviewed following labs and imaging studies  CBC: Recent Labs  Lab 01/22/24 1849  WBC 13.0*  NEUTROABS 11.7*  HGB 11.2*  HCT 34.2*  MCV 102.4*  PLT 170   Basic Metabolic Panel: Recent Labs  Lab 01/22/24 1849  NA 135  K 4.4  CL 97*  CO2 20*  GLUCOSE 523*  BUN 25*  CREATININE 1.27*  CALCIUM 9.2   GFR: Estimated Creatinine Clearance: 54.4 mL/min (A) (by C-G formula based on SCr of 1.27 mg/dL (H)). Liver Function Tests: Recent Labs  Lab 01/22/24 1849  AST 26  ALT 26  ALKPHOS 80  BILITOT 0.8  PROT 6.8  ALBUMIN 3.8   No results for input(s): "LIPASE", "AMYLASE" in the last 168 hours. No results for input(s): "AMMONIA" in the last 168 hours. Coagulation Profile: No results for input(s): "INR", "PROTIME" in the last 168 hours. Cardiac Enzymes: No results for input(s): "CKTOTAL", "CKMB", "CKMBINDEX", "TROPONINI" in the last 168 hours. BNP (last 3 results) No results for input(s): "PROBNP" in the last 8760 hours. HbA1C: No results for input(s): "HGBA1C" in the last 72 hours. CBG: Recent Labs  Lab 01/22/24 2220  GLUCAP 375*   Lipid Profile: No results for input(s): "CHOL", "HDL",  "LDLCALC", "TRIG", "CHOLHDL", "LDLDIRECT" in the last 72 hours. Thyroid Function Tests: No results for input(s): "TSH", "T4TOTAL", "FREET4", "T3FREE", "THYROIDAB" in the last  72 hours. Anemia Panel: No results for input(s): "VITAMINB12", "FOLATE", "FERRITIN", "TIBC", "IRON", "RETICCTPCT" in the last 72 hours. Urine analysis:    Component Value Date/Time   COLORURINE YELLOW 07/30/2022 0726   APPEARANCEUR CLEAR 07/30/2022 0726   LABSPEC >1.046 (H) 07/30/2022 0726   PHURINE 5.0 07/30/2022 0726   GLUCOSEU NEGATIVE 07/30/2022 0726   HGBUR SMALL (A) 07/30/2022 0726   BILIRUBINUR NEGATIVE 07/30/2022 0726   KETONESUR NEGATIVE 07/30/2022 0726   PROTEINUR NEGATIVE 07/30/2022 0726   NITRITE NEGATIVE 07/30/2022 0726   LEUKOCYTESUR NEGATIVE 07/30/2022 0726   Sepsis Labs: @LABRCNTIP (procalcitonin:4,lacticidven:4) ) Recent Results (from the past 240 hours)  Resp panel by RT-PCR (RSV, Flu A&B, Covid) Anterior Nasal Swab     Status: None   Collection Time: 01/22/24  6:38 PM   Specimen: Anterior Nasal Swab  Result Value Ref Range Status   SARS Coronavirus 2 by RT PCR NEGATIVE NEGATIVE Final   Influenza A by PCR NEGATIVE NEGATIVE Final   Influenza B by PCR NEGATIVE NEGATIVE Final    Comment: (NOTE) The Xpert Xpress SARS-CoV-2/FLU/RSV plus assay is intended as an aid in the diagnosis of influenza from Nasopharyngeal swab specimens and should not be used as a sole basis for treatment. Nasal washings and aspirates are unacceptable for Xpert Xpress SARS-CoV-2/FLU/RSV testing.  Fact Sheet for Patients: BloggerCourse.com  Fact Sheet for Healthcare Providers: SeriousBroker.it  This test is not yet approved or cleared by the [place] FDA and has been authorized for detection and/or diagnosis of SARS-CoV-2 by FDA under an Emergency Use Authorization (EUA). This EUA will remain in effect (meaning this test can be used) for the duration of  the COVID-19 declaration under Section 564(b)(1) of the Act, 21 U.S.C. section 360bbb-3(b)(1), unless the authorization is terminated or revoked.     Resp Syncytial Virus by PCR NEGATIVE NEGATIVE Final    Comment: (NOTE) Fact Sheet for Patients: BloggerCourse.com  Fact Sheet for Healthcare Providers: SeriousBroker.it  This test is not yet approved or cleared by the [place] FDA and has been authorized for detection and/or diagnosis of SARS-CoV-2 by FDA under an Emergency Use Authorization (EUA). This EUA will remain in effect (meaning this test can be used) for the duration of the COVID-19 declaration under Section 564(b)(1) of the Act, 21 U.S.C. section 360bbb-3(b)(1), unless the authorization is terminated or revoked.  Performed at [facility], [address], [place], [place] [address]      Radiological Exams on Admission: CT Angio Chest PE W and/or Wo Contrast Result Date: 01/22/2024 CLINICAL DATA:  Shortness of breath EXAM: CT ANGIOGRAPHY CHEST WITH CONTRAST TECHNIQUE: Multidetector CT imaging of the chest was performed using the standard protocol during bolus administration of intravenous contrast. Multiplanar CT image reconstructions and MIPs were obtained to evaluate the vascular anatomy. RADIATION DOSE REDUCTION: This exam was performed according to the departmental dose-optimization program which includes automated exposure control, adjustment of the mA and/or kV according to patient size and/or use of iterative reconstruction technique. CONTRAST:  75mL OMNIPAQUE IOHEXOL 350 MG/ML SOLN COMPARISON:  CT 12/22/2023, chest x-ray 12/22/2023 FINDINGS: Cardiovascular: Satisfactory opacification of the pulmonary arteries to the segmental level. No evidence of pulmonary embolism. 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 No significant pulmonary vascular enhancement in the lung  parenchymal abnormality at the right upper lobe. Mild aortic atherosclerosis. No aneurysm. Coronary vascular calcification. Cardiomegaly. No pericardial effusion Mediastinum/Nodes: Patent trachea. No thyroid mass. Mild mediastinal adenopathy as before. Right paratracheal nodes  measuring up to 12 mm. Esophagus shows moderate hiatal hernia Lungs/Pleura: Subpleural reticulation within the bilateral lungs with areas of mild ground-glass disease suspicious for chronic lung disease. Persistent consolidation and cavitary process in the right upper lobe. Persistent superior right lower lobe masslike consolidation with cavitation, measures about 4.7 by 3.6 cm, previously 4.6 x 3.9 cm in largely unchanged. Upper Abdomen: No acute finding Musculoskeletal: No acute osseous abnormality Review of the MIP images confirms the above findings. IMPRESSION: 1. No definite acute pulmonary embolus is seen. 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 2. Little change in masslike cavitary and consolidative abnormalities in the right upper lobe and superior right lower lobe. Given lack of interval change, neoplasm is of concern and pulmonary consultation is recommended if not already performed 3. 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 4. Similar mild mediastinal adenopathy 5. Moderate hiatal hernia Aortic Atherosclerosis (ICD10-I70.0). Electronically Signed   By: Jasmine Pang M.D.   On: 01/22/2024 22:56    EKG: Independently reviewed.  Normal sinus rhythm RBBB.  Assessment/Plan Principal Problem:   Hypertensive urgency Active Problems:   AF (atrial fibrillation) (HCC)   CKD (chronic kidney disease), stage III (HCC)   Hyperglycemia    Hypertensive urgency -   could be from recent use of prednisone.  Patient takes lisinopril  Coreg and Lasix.  I did order 1 dose of hydralazine 25 mg p.o.  Will closely monitor blood pressure trends.  If patient's anion gap is improved restart Lasix. Hyperglycemia likely from prednisone.  Check hemoglobin A1c.  Did receive 8 units of subcutaneous NovoLog insulin.  Will closely follow Accu-Cheks with sliding scale coverage. Acute gout affecting both ankles which patient states after taking 2 days of steroids has completely resolved.  I do not see any obvious swelling in the joints or erythema at this time.  Continue allopurinol. 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 Chronic CHF last EF measured was 55 to 60% last month.  Restart Lasix once gap gets better.  Continue lisinopril. OSA on CPAP. CAD status post PCI in 2006.  Takes Xarelto beta-blockers and statins.  Denies any chest pain.  Troponins mildly elevated could be from uncontrolled hypertension. A-fib status post ablation in 2018 on Coreg and Xarelto.  Since patient has hypertensive urgency and hyperglycemia in the 500s will need close monitoring further workup and more than 2 midnight stay.   DVT prophylaxis: Xarelto. Code Status: Full code. Family Communication: Discussed with patient. Disposition Plan: Monitored bed. Consults called: None. Admission status: Observation.

## 2024-01-22 NOTE — ED Notes (Signed)
 [place], [place] informed of the patient's blood sugar of 523 via face to face conversation. No new verbal orders received.

## 2024-01-23 ENCOUNTER — Encounter (HOSPITAL_COMMUNITY): Payer: Self-pay | Admitting: Internal Medicine

## 2024-01-23 DIAGNOSIS — R918 Other nonspecific abnormal finding of lung field: Secondary | ICD-10-CM

## 2024-01-23 DIAGNOSIS — N183 Chronic kidney disease, stage 3 unspecified: Secondary | ICD-10-CM | POA: Insufficient documentation

## 2024-01-23 DIAGNOSIS — I160 Hypertensive urgency: Secondary | ICD-10-CM | POA: Diagnosis not present

## 2024-01-23 DIAGNOSIS — M109 Gout, unspecified: Secondary | ICD-10-CM | POA: Insufficient documentation

## 2024-01-23 DIAGNOSIS — I509 Heart failure, unspecified: Secondary | ICD-10-CM

## 2024-01-23 LAB — COMPREHENSIVE METABOLIC PANEL
ALT: 25 U/L (ref 0–44)
AST: 25 U/L (ref 15–41)
Albumin: 3.4 g/dL — ABNORMAL LOW (ref 3.5–5.0)
Alkaline Phosphatase: 74 U/L (ref 38–126)
Anion gap: 13 (ref 5–15)
BUN: 24 mg/dL — ABNORMAL HIGH (ref 8–23)
CO2: 19 mmol/L — ABNORMAL LOW (ref 22–32)
Calcium: 8.9 mg/dL (ref 8.9–10.3)
Chloride: 104 mmol/L (ref 98–111)
Creatinine, Ser: 0.94 mg/dL (ref 0.61–1.24)
GFR, Estimated: >60 mL/min (ref >60)
Glucose, Bld: 241 mg/dL — ABNORMAL HIGH (ref 70–99)
Potassium: 4 mmol/L (ref 3.5–5.1)
Sodium: 136 mmol/L (ref 135–145)
Total Bilirubin: 0.8 mg/dL (ref 0.0–1.2)
Total Protein: 6.6 g/dL (ref 6.5–8.1)

## 2024-01-23 LAB — GLUCOSE, CAPILLARY
Glucose-Capillary: 292 mg/dL — ABNORMAL HIGH (ref 70–99)
Glucose-Capillary: 160 mg/dL — ABNORMAL HIGH (ref 70–99)
Glucose-Capillary: 124 mg/dL — ABNORMAL HIGH (ref 70–99)
Glucose-Capillary: 116 mg/dL — ABNORMAL HIGH (ref 70–99)

## 2024-01-23 LAB — TROPONIN I (HIGH SENSITIVITY)
Troponin I (High Sensitivity): 33 ng/L — ABNORMAL HIGH (ref <18)
Troponin I (High Sensitivity): 37 ng/L — ABNORMAL HIGH (ref <18)

## 2024-01-23 LAB — CBC
HCT: 32.8 % — ABNORMAL LOW (ref 39.0–52.0)
Hemoglobin: 11 g/dL — ABNORMAL LOW (ref 13.0–17.0)
MCH: 33.5 pg (ref 26.0–34.0)
MCHC: 33.5 g/dL (ref 30.0–36.0)
MCV: 100 fL (ref 80.0–100.0)
Platelets: 163 10*3/uL (ref 150–400)
RBC: 3.28 MIL/uL — ABNORMAL LOW (ref 4.22–5.81)
RDW: 14.8 % (ref 11.5–15.5)
WBC: 13.2 10*3/uL — ABNORMAL HIGH (ref 4.0–10.5)
nRBC: 0.2 % (ref 0.0–0.2)

## 2024-01-23 LAB — TSH: TSH: 1.338 u[IU]/mL (ref 0.350–4.500)

## 2024-01-23 LAB — HEMOGLOBIN A1C
Hgb A1c MFr Bld: 6 % — ABNORMAL HIGH (ref 4.8–5.6)
Mean Plasma Glucose: 125.5 mg/dL

## 2024-01-23 LAB — VITAMIN B12: Vitamin B-12: 1056 pg/mL — ABNORMAL HIGH (ref 180–914)

## 2024-01-23 LAB — FOLATE: Folate: 13.3 ng/mL (ref >5.9)

## 2024-01-23 LAB — MRSA NEXT GEN BY PCR, NASAL: MRSA by PCR Next Gen: NOT DETECTED

## 2024-01-23 LAB — BRAIN NATRIURETIC PEPTIDE: B Natriuretic Peptide: 416.8 pg/mL — ABNORMAL HIGH (ref 0.0–100.0)

## 2024-01-23 MED ORDER — RIVAROXABAN 10 MG PO TABS
20.0000 mg | ORAL_TABLET | Freq: Every day | ORAL | Status: DC
  Administered 2024-01-23: 20 mg via ORAL
  Filled 2024-01-23: qty 2

## 2024-01-23 MED ORDER — SERTRALINE HCL 50 MG PO TABS
25.0000 mg | ORAL_TABLET | Freq: Every day | ORAL | Status: DC
  Filled 2024-01-23: qty 1

## 2024-01-23 MED ORDER — ALLOPURINOL 100 MG PO TABS
100.0000 mg | ORAL_TABLET | Freq: Every day | ORAL | Status: DC
  Administered 2024-01-23: 100 mg via ORAL
  Filled 2024-01-23: qty 1

## 2024-01-23 MED ORDER — ATORVASTATIN CALCIUM 40 MG PO TABS
40.0000 mg | ORAL_TABLET | Freq: Every day | ORAL | Status: DC
  Administered 2024-01-23: 40 mg via ORAL
  Filled 2024-01-23: qty 1

## 2024-01-23 MED ORDER — LISINOPRIL 20 MG PO TABS
40.0000 mg | ORAL_TABLET | Freq: Every day | ORAL | Status: DC
  Administered 2024-01-23: 40 mg via ORAL
  Filled 2024-01-23: qty 2

## 2024-01-23 MED ORDER — ALBUTEROL SULFATE (2.5 MG/3ML) 0.083% IN NEBU: 2.5000 mg | INHALATION_SOLUTION | Freq: Four times a day (QID) | RESPIRATORY_TRACT | Status: DC | PRN

## 2024-01-23 MED ORDER — FERROUS SULFATE 325 (65 FE) MG PO TABS
325.0000 mg | ORAL_TABLET | Freq: Every morning | ORAL | Status: DC
  Administered 2024-01-23: 325 mg via ORAL
  Filled 2024-01-23: qty 1

## 2024-01-23 MED ORDER — ALLOPURINOL 100 MG PO TABS
300.0000 mg | ORAL_TABLET | Freq: Every day | ORAL | Status: DC
  Administered 2024-01-23: 300 mg via ORAL
  Filled 2024-01-23: qty 3

## 2024-01-23 MED ORDER — INSULIN ASPART 100 UNIT/ML IJ SOLN
0.0000 [IU] | Freq: Three times a day (TID) | INTRAMUSCULAR | Status: DC
  Administered 2024-01-23: 2 [IU] via SUBCUTANEOUS
  Administered 2024-01-23: 1 [IU] via SUBCUTANEOUS

## 2024-01-23 MED ORDER — HYDRALAZINE HCL 25 MG PO TABS
25.0000 mg | ORAL_TABLET | Freq: Once | ORAL | Status: AC
  Administered 2024-01-23: 25 mg via ORAL
  Filled 2024-01-23: qty 1

## 2024-01-23 MED ORDER — CARVEDILOL 12.5 MG PO TABS
25.0000 mg | ORAL_TABLET | Freq: Two times a day (BID) | ORAL | Status: DC
  Administered 2024-01-23 (×3): 25 mg via ORAL
  Filled 2024-01-23 (×3): qty 2

## 2024-01-23 MED ORDER — PANTOPRAZOLE SODIUM 40 MG PO TBEC
40.0000 mg | DELAYED_RELEASE_TABLET | Freq: Every day | ORAL | Status: DC
  Administered 2024-01-23: 40 mg via ORAL
  Filled 2024-01-23: qty 1

## 2024-01-23 NOTE — Plan of Care (Signed)

## 2024-01-23 NOTE — Care Management Obs Status (Signed)
 MEDICARE OBSERVATION STATUS NOTIFICATION   Patient Details  Name: Peter [name] MRN: [id number] Date of Birth: [birth year]   Medicare Observation Status Notification Given:  Yes    Tom-Johnson, Hershal Coria, RN 01/23/2024, 2:48 PM

## 2024-01-23 NOTE — TOC CM/SW Note (Signed)
 Transition of Care [facility]) - Inpatient Brief Assessment   Patient Details  Name: Peter [name] MRN: [id number] Date of Birth: 07-13-1946  Transition of Care [facility]) CM/SW Contact:    Tom-Johnson, Hershal Coria, RN Phone Number: 01/23/2024, 3:11 PM   Clinical Narrative:  Patient presented to the ED with sudden onset of Exertional Shortness of Breath, Weakness and and elevated BP with Systolic in the 180's. Patient states he is compliant with his BP meds. CBG noted in the 500's. Patient states he was taking Prednisone for his Gout flare.  Patient follow-up with Pulmonologist for Right Upper Lobe Lung Mass with Cavitation.  Has Hx of A-Fib, on Xarelto, CHF, CAD s/p PCI in 2006.   From home with wife, has two children and a supportive sister.  Retired, independent with care and drive self. Has a cane, walker, crutches and shower seat at home.  PCP is Darrow Bussing, MD and uses [employer] [facility] [address] and also uses [employer].   No TOC needs or recommendations noted at this time.  Patient not Medically ready for discharge.  CM will continue to follow as patient progresses with care towards discharge.          Transition of Care Asessment: Insurance and Status: Insurance coverage has been reviewed Patient has primary care physician: Yes Home environment has been reviewed: Yes Prior level of function:: Independent Prior/Current Home Services: No current home services Social Drivers of Health Review: SDOH reviewed no interventions necessary Readmission risk has been reviewed: Yes Transition of care needs: no transition of care needs at this time

## 2024-01-23 NOTE — Plan of Care (Signed)

## 2024-01-23 NOTE — Discharge Summary (Signed)
 DISCHARGE SUMMARY  Peter [name]  MR#: [id number]  DOB:04/25/1946  Date of Admission: 01/22/2024 Date of Discharge: 01/23/2024  Attending Physician:Peter Markwood Silvestre Gunner, [name]  Patient's XBJ:[id number], Peter [name]  Disposition: D/C home   Follow-up Appts:  Follow-up Information     [place], Peter [name] Follow up in 7 day(s).   Specialty: Family Medicine Contact information: [address] [place] [address] [phone]         [facility] Follow up.   Why: The office will be calling you to set up an appointment to have your lung lesion investigated. If you do not hear from the office by 3/17 call the number above.                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 He returned to the ER 3/13 when he noted that his blood pressures were consistently >180 at home. 2 days prior to his presentation he had suffered a gout attack for which he had been placed on prednisone. While in the ER his systolic blood pressures were noted to range from 160-180. CBG was also found to be markedly elevated at approximately 500. CT chest revealed consolidative changes within the known cavitary lesion in his right upper lobe. Patient was without chest pain.   Hospital Course:  Hypertensive urgency Likely due to use of prednisone -continue usual home medications -in absence of  further steroid dosing the patient's blood pressure has improved nicely -outpatient monitoring of blood pressure remains appropriate   Hyperglycemia without history of DM Likely due to prednisone dosing - A1c 6.0 suggesting prediabetic state -CBGs improved in absence of further steroid dosing with no indication to initiate further therapy for now   Acute gout flare of bilateral ankles Rapidly resolved after 48 hours of steroid dosing -continue allopurinol at prior home dose   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 Was to be referred to [facility] in outpatient setting -I asked pulmonary to see the patient while he was in the hospital to expedite his evaluation -given that he is on Xarelto intervention was not able to be carried out during his stay -patient to follow-up in the pulmonary office to coordinate scheduling of a navigational bronchoscopy with biopsies   CAD status post PCI 2006 Continue usual beta-blocker and statin -asymptomatic presently   Atrial fibrillation status post ablation Well-controlled during this admission -continue Xarelto    Chronic diastolic congestive heart failure Continue ACE inhibitor and usual dose of Lasix   OSA Continue usual CPAP dose  Allergies as of 01/23/2024       Reactions   Prednisone Other (See Comments)   Made BS increase to 500- discovered on 01/22/24   Spironolactone Other (See Comments)   Tender breast        Medication List     STOP taking these medications    predniSONE 10 MG (21) Tbpk tablet Commonly known as: STERAPRED UNI-PAK 21 TAB  TAKE these medications    albuterol 108 (90 Base) MCG/ACT inhaler Commonly known as: VENTOLIN HFA Inhale 2 puffs into the lungs every 6 (six) hours as needed for wheezing or shortness of breath (intractable cough).   allopurinol 300 MG tablet Commonly known as: ZYLOPRIM Take 300 mg by mouth in the morning.   allopurinol 100 MG tablet Commonly known as: ZYLOPRIM Take 100 mg by  mouth at bedtime.   atorvastatin 40 MG tablet Commonly known as: LIPITOR TAKE 1 TABLET EVERY DAY   carvedilol 25 MG tablet Commonly known as: COREG TAKE 1 TABLET TWICE DAILY WITH MEALS   diphenhydramine-acetaminophen 25-500 MG Tabs tablet Commonly known as: TYLENOL PM Take 1 tablet by mouth at bedtime.   ferrous sulfate 325 (65 FE) MG tablet Take 1 tablet (325 mg total) by mouth every morning.   furosemide 40 MG tablet Commonly known as: LASIX TAKE 1 TABLET EVERY DAY   lisinopril 40 MG tablet Commonly known as: ZESTRIL TAKE 1 TABLET EVERY DAY   nitroGLYCERIN 0.4 MG SL tablet Commonly known as: NITROSTAT Place 1 tablet (0.4 mg total) under the tongue every 5 (five) minutes as needed for chest pain. X 3 doses   pantoprazole 40 MG tablet Commonly known as: PROTONIX TAKE 1 TABLET EVERY DAY (NEED [name] APPOINTMENT) What changed:  how much to take how to take this when to take this reasons to take this additional instructions   polyvinyl alcohol 1.4 % ophthalmic solution Commonly known as: LIQUIFILM TEARS Place 1 drop into both eyes daily as needed for dry eyes.   rivaroxaban 20 MG Tabs tablet Commonly known as: Xarelto Take 1 tablet (20 mg total) by mouth daily. What changed: when to take this   sertraline 25 MG tablet Commonly known as: ZOLOFT Take 25 mg by mouth daily.   VITAMIN B-12 PO Take 1 tablet by mouth daily.        Day of Discharge BP (!) 154/93   Pulse 67   Temp 98.3 F (36.8 C)   Resp 18   Ht 5\' 8"  (1.727 m)   Wt 98 kg   SpO2 96%   BMI 32.84 kg/m   Physical Exam: General: No acute respiratory distress Lungs: Clear to auscultation bilaterally without wheezes or crackles Cardiovascular: Regular rate and rhythm without murmur gallop or rub normal S1 and S2 Abdomen: Nontender, nondistended, soft, bowel sounds positive, no rebound, no ascites, no appreciable mass Extremities: No significant cyanosis, clubbing, or edema bilateral lower  extremities  Basic Metabolic Panel: Recent Labs  Lab 01/22/24 1849 01/23/24 0220  NA 135 136  K 4.4 4.0  CL 97* 104  CO2 20* 19*  GLUCOSE 523* 241*  BUN 25* 24*  CREATININE 1.27* 0.94  CALCIUM 9.2 8.9    CBC: Recent Labs  Lab 01/22/24 1849 01/23/24 0220  WBC 13.0* 13.2*  NEUTROABS 11.7*  --   HGB 11.2* 11.0*  HCT 34.2* 32.8*  MCV 102.4* 100.0  PLT 170 163    Time spent in discharge (includes decision making & examination of pt): 35 minutes  01/23/2024, 5:15 PM   Lonia Blood, [name] [facility] Office  [phone]

## 2024-01-23 NOTE — Progress Notes (Signed)
 New Admission Note: 64m-05   Arrival Method: Wheelchair Mental Orientation: a/ox4 Telemetry: box 4 Assessment:completed Skin: intact IV: left AC Pain: n/a Tubes: n/a Safety Measures: nonskid socks Admission: completed Orientation: Patient has been oriented to the room, unit and staff.  Family: n/a Belongings: kept at bedside  Orders have been reviewed and implemented. Will continue to monitor the patient. Call light has been placed within reach and bed alarm has been activated.   Fabian Sharp BSN, RN-BC Phone number: [phone]

## 2024-01-23 NOTE — Consult Note (Signed)
 [facility]monary Medicine Consultation      Date: 01/23/2024,   MRN# [id number] Peter [name] 07-Apr-1946      CHIEF COMPLAINT:  Abnormal CT chest     HISTORY OF PRESENT ILLNESS   78 y.o. male with history of chronic CHF, CAD status post PCI, atrial fibrillation status post ablation, sleep apnea, gout, depression recently admitted to the hospital for viral pneumonia INFL A and was discharged home on 12/25/2023 on chronic oral AC  presents to the ER because patient noticed that his blood pressure was consistently elevated more than 180 systolic.    During recent admission FEB 2025 patient's CT scan did show a cavitary mass in the lung and has follow-up with pulmonologist for APRIl 17th   Patient states 3 days ago he had gout attack of his both ankles and was placed on prednisone which patient has taken for the last 2 days and the last dose was a few hours before coming to the ER.  Denies any chest pain or shortness of breath.  Has been taking his antihypertensives as advised.   PCCM consulted for assessment for un resolving CT scans infiltrative disease and pneumonia, lung mass   01/22/2024 CT SCANS REVIEWED IN DETAIL WITH PATIENT    PAST MEDICAL HISTORY   Past Medical History:  Diagnosis Date   Arthritis    At risk for sleep apnea    STOP-BANG= 5       SENT TO PCP 07-12-2015   CAD (coronary artery disease)    a. s/p PCI with DES x 3 to LAD in 2006 with bifurcation lesion and Plavix indefinitely was recommended.   Chronic combined systolic and diastolic CHF (congestive heart failure) (HCC)    a. Prior EF normal but EF dropped to 30-35% in 07/2016 in setting of AFIB.   CKD (chronic kidney disease), stage III (HCC)    Depression    Dyslipidemia    Dyspnea    tired quick d/t Afib   Dyspnea on exertion    Dysrhythmia    A.FIB   GERD (gastroesophageal reflux disease)    History of GI bleed    History of kidney stones    History of MI (myocardial infarction)     11-05-2005--  periprocedural MI  (cardiac cath) per dr Juanda Chance note   Hypertension    Hypertriglyceridemia    Iron deficiency anemia    Myocardial infarction [facility])    Persistent atrial fibrillation (HCC)    a. 07/2016, started on Eliquis -> readmitted later that month for AF RVR/acute HF - initially unsuccessful DCCV following TEE, so loaded with amio with repeat successful DCCV 07/2816.   Right ureteral stone    S/P drug eluting coronary stent placement    x3  to LAD  2006   Sleep apnea    wears BIPAP, 14 up to 24     SURGICAL HISTORY   Past Surgical History:  Procedure Laterality Date   CARDIOVASCULAR STRESS TEST  01-06-2013  dr Myrtis Ser   normal perfusion study/  no ischemia or infarct/  normal LV function and wall motion , ef 57%   CARDIOVERSION N/A 08/05/2016   Procedure: CARDIOVERSION;  Surgeon: Thurmon Fair, MD;  Location: [facility] ENDOSCOPY;  Service: Cardiovascular;  Laterality: N/A;   CARDIOVERSION N/A 08/08/2016   Procedure: CARDIOVERSION;  Surgeon: Jake Bathe, MD;  Location: [facility] ENDOSCOPY;  Service: Cardiovascular;  Laterality: N/A;   CORONARY ANGIOPLASTY  11-13-2005 dr Juanda Chance   Successful touch up post-dilatation of the  3 tandem overlying Cypher stents in proximal to mid LAD (based on IVUS 0% stenosis and patent diagonal branch)/  Cutting Balloon Angioplasty of Ramus branch   CORONARY ANGIOPLASTY WITH STENT PLACEMENT  11-05-2005   dr Smitty Cords brodie   PCI and DES x3 to birfurcation lesion LAD and diagonal branch of LAD/   20% dLM,  30-40% RCA, ef 60%   CYSTOSCOPY WITH RETROGRADE PYELOGRAM, URETEROSCOPY AND STENT PLACEMENT Right 07/14/2015   Procedure: CYSTOSCOPY WITH RETROGRADE PYELOGRAM, URETEROSCOPY AND STENT PLACEMENT;  Surgeon: Sebastian Ache, MD;  Location: [facility];  Service: Urology;  Laterality: Right;   ELECTROPHYSIOLOGIC STUDY N/A 10/24/2016   Procedure: Atrial Fibrillation Ablation;  Surgeon: Will Jorja Loa, MD;  Location: [facility] INVASIVE CV LAB;   Service: Cardiovascular;  Laterality: N/A;   EXTRACORPOREAL SHOCK WAVE LITHOTRIPSY  yrs ago   HEMORRHOID SURGERY N/A 10/24/2017   Procedure: HEMORRHOIDECTOMY;  Surgeon: Andria Meuse, MD;  Location: [facility] OR;  Service: General;  Laterality: N/A;   HERNIA REPAIR Bilateral 2000   Inguinal Hernia   IR GENERIC HISTORICAL  10/23/2016   IR FLUORO GUIDE CV LINE RIGHT 10/23/2016 Berdine Dance, MD [facility]   IR GENERIC HISTORICAL  10/23/2016   IR US GUIDE VASC ACCESS RIGHT 10/23/2016 Berdine Dance, MD [facility]   LEFT URETEROSCOPIC STONE EXTRACTION  04/10/2006   STONE EXTRACTION WITH BASKET Right 07/14/2015   Procedure: STONE EXTRACTION WITH BASKET;  Surgeon: Sebastian Ache, MD;  Location[facility];  Service: Urology;  Laterality: Right;   TEE WITHOUT CARDIOVERSION N/A 08/05/2016   Procedure: TRANSESOPHAGEAL ECHOCARDIOGRAM (TEE);  Surgeon: Thurmon Fair, MD;  Location: [facility] ENDOSCOPY;  Service: Cardiovascular;  Laterality: N/A;   TOTAL HIP ARTHROPLASTY Right 07/16/2022   Procedure: RIGHT TOTAL HIP ARTHROPLASTY ANTERIOR APPROACH;  Surgeon: Tarry Kos, MD;  Location: [facility] OR;  Service: Orthopedics;  Laterality: Right;  3-C   TRANSTHORACIC ECHOCARDIOGRAM  02/04/2012   grade I diastolic dysfunction/  ef 55-60%/  trivial MR and TR/  mild LAE     FAMILY HISTORY   Family History  Problem Relation Age of Onset   Heart attack Father 49   Congestive Heart Failure Father 20   Cancer Other      SOCIAL HISTORY   Social History   Tobacco Use   Smoking status: Never   Smokeless tobacco: Never  Vaping Use   Vaping status: Never Used  Substance Use Topics   Alcohol use: No    Comment: 2 beers a year   Drug use: No     MEDICATIONS    Home Medication:    Current Medication:  Current Facility-Administered Medications:    albuterol (PROVENTIL) (2.5 MG/3ML) 0.083% nebulizer solution 2.5 mg, 2.5 mg, Inhalation, Q6H PRN, Eduard Clos, MD   allopurinol  (ZYLOPRIM) tablet 100 mg, 100 mg, Oral, QHS, Midge Minium N, MD, 100 mg at 01/23/24 0145   allopurinol (ZYLOPRIM) tablet 300 mg, 300 mg, Oral, Daily, Eduard Clos, MD, 300 mg at 01/23/24 0851   atorvastatin (LIPITOR) tablet 40 mg, 40 mg, Oral, Daily, Eduard Clos, MD, 40 mg at 01/23/24 0852   carvedilol (COREG) tablet 25 mg, 25 mg, Oral, BID WC, Eduard Clos, MD, 25 mg at 01/23/24 1642   ferrous sulfate tablet 325 mg, 325 mg, Oral, q morning, Eduard Clos, MD, 325 mg at 01/23/24 0852   insulin aspart (novoLOG) injection 0-9 Units, 0-9 Units, Subcutaneous, TID WC, Eduard Clos, MD, 1 Units at 01/23/24  1201   lisinopril (ZESTRIL) tablet 40 mg, 40 mg, Oral, Daily, Eduard Clos, MD, 40 mg at 01/23/24 4098   pantoprazole (PROTONIX) EC tablet 40 mg, 40 mg, Oral, Daily, Eduard Clos, MD, 40 mg at 01/23/24 1191   sertraline (ZOLOFT) tablet 25 mg, 25 mg, Oral, Daily, Eduard Clos, MD    ALLERGIES   Prednisone and Spironolactone     REVIEW OF SYSTEMS    Review of Systems:  Gen:  Denies  fever, sweats, chills weigh loss  HEENT: Denies blurred vision, double vision, ear pain, eye pain, hearing loss, nose bleeds, sore throat Cardiac:  No dizziness, chest pain or heaviness, chest tightness,edema Resp:   Denies cough or sputum porduction, shortness of breath,wheezing, hemoptysis,  Gi: Denies swallowing difficulty, stomach pain, nausea or vomiting, diarrhea, constipation, bowel incontinence Gu:  Denies bladder incontinence, burning urine Ext:   Denies Joint pain, stiffness or swelling Skin: Denies  skin rash, easy bruising or bleeding or hives Endoc:  Denies polyuria, polydipsia , polyphagia or weight change Psych:   Denies depression, insomnia or hallucinations   Other:  All other systems negative   VS: BP (!) 154/93   Pulse 67   Temp 98.3 F (36.8 C)   Resp 18   Ht 5\' 8"  (1.727 m)   Wt 98 kg   SpO2 96%   BMI 32.84  kg/m      PHYSICAL EXAM  General Appearance: No distress  EYES PERRLA, EOM intact.   NECK Supple, No JVD Pulmonary: normal breath sounds, No wheezing.  CardiovascularNormal S1,S2.  No m/r/g.   Abdomen: Benign, Soft, non-tender. Skin:   warm, no rashes, no ecchymosis  Extremities: normal, no cyanosis, clubbing. Neuro:without focal findings,  speech normal  PSYCHIATRIC: Mood, affect within normal limits.   ALL OTHER ROS ARE NEGATIVE      IMAGING    CT Angio Chest PE W and/or Wo Contrast Result Date: 01/22/2024 CLINICAL DATA:  Shortness of breath EXAM: CT ANGIOGRAPHY CHEST WITH CONTRAST TECHNIQUE: Multidetector CT imaging of the chest was performed using the standard protocol during bolus administration of intravenous contrast. Multiplanar CT image reconstructions and MIPs were obtained to evaluate the vascular anatomy. RADIATION DOSE REDUCTION: This exam was performed according to the departmental dose-optimization program which includes automated exposure control, adjustment of the mA and/or kV according to patient size and/or use of iterative reconstruction technique. CONTRAST:  75mL OMNIPAQUE IOHEXOL 350 MG/ML SOLN COMPARISON:  CT 12/22/2023, chest x-ray 12/22/2023 FINDINGS: Cardiovascular: Satisfactory opacification of the pulmonary arteries to the segmental level. No evidence of pulmonary embolism. 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 No significant pulmonary vascular enhancement in the lung parenchymal abnormality at the right upper lobe. Mild aortic atherosclerosis. No aneurysm. Coronary vascular calcification. Cardiomegaly. No pericardial effusion Mediastinum/Nodes: Patent trachea. No thyroid mass. Mild mediastinal adenopathy as before. Right paratracheal nodes measuring up to 12 mm. Esophagus shows moderate hiatal hernia Lungs/Pleura: Subpleural reticulation within the bilateral lungs with areas of mild ground-glass disease suspicious  for chronic lung disease. Persistent consolidation and cavitary process in the right upper lobe. Persistent superior right lower lobe masslike consolidation with cavitation, measures about 4.7 by 3.6 cm, previously 4.6 x 3.9 cm in largely unchanged. Upper Abdomen: No acute finding Musculoskeletal: No acute osseous abnormality Review of the MIP images confirms the above findings. IMPRESSION: 1. No definite acute pulmonary embolus is seen. 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 2. Little change in masslike cavitary and consolidative abnormalities in the right upper lobe and superior right lower lobe. Given lack of interval change, neoplasm is of concern and pulmonary consultation is recommended if not already performed 3. 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 4. Similar mild mediastinal adenopathy 5. Moderate hiatal hernia Aortic Atherosclerosis (ICD10-I70.0). Electronically Signed   By: Jasmine Pang M.D.   On: 01/22/2024 22:56      ASSESSMENT/PLAN    78 year old pleasant white male seen today for abnormal CT scans The first initial scan was in February 2025 which showed right upper lobe right middle lobe infiltrative diseases with probable underlying mass with some adenopathy Repeat CT chest March 2025 showed similar results  PCCM was consulted to assess for bronchoscopy  After further evaluation findings are highly concerning for malignancy Patient currently on Xarelto Recommend preop anesthesiology and preop cardiology clearance May consider another repeat CT chest prior to procedure  I have relayed to attending physician to discharge patient We will have close follow-up and patient can restart his Xarelto Prior to procedure being planned, he will need to stop his Xarelto 3 days  prior  Lucie Leather, M.D.  [facility]  Medical Director [facility] Medical Director [facility] Cardio-Pulmonary Department

## 2024-01-26 ENCOUNTER — Telehealth: Payer: Self-pay | Admitting: Emergency Medicine

## 2024-01-26 NOTE — Telephone Encounter (Signed)
 This pt has a new consult OV scheduled for April. Is it possible to get him moved up to be seen by Dr Celine Mans or any provider sooner. Would be ok to use RB nodule slot or blocked slot if needed. Thank you

## 2024-01-26 NOTE — Telephone Encounter (Signed)
 ATC x1, ldvmtcb. Next avail is blocked spot with RB 3/21.

## 2024-01-28 NOTE — Telephone Encounter (Signed)
 Pt is scheduled for soonest avail appt. NFN

## 2024-01-30 ENCOUNTER — Encounter: Payer: Self-pay | Admitting: Emergency Medicine

## 2024-01-30 ENCOUNTER — Ambulatory Visit: Admitting: Emergency Medicine

## 2024-01-30 VITALS — BP 108/62 | HR 80 | Ht 68.0 in | Wt 212.0 lb

## 2024-01-30 DIAGNOSIS — R9389 Abnormal findings on diagnostic imaging of other specified body structures: Secondary | ICD-10-CM

## 2024-01-30 NOTE — Patient Instructions (Signed)
 YOUR PLAN:  -ABNORMAL CHEST CT: Your CT scan showed areas in your lungs that might be due to an infection or possibly something more serious. We will do another CT scan and then a bronchoscopy on February 09, 2024, to get a closer look and possibly take samples. Please stop taking Xarelto at least two days before the bronchoscopy to reduce the risk of bleeding.  We will arrange for navigational bronchoscopy under general anesthesia as an outpatient at [facility] endoscopy.  You will need to stop your Xarelto 2 days prior.  You will need a designated driver and someone to watch you at home that day after the procedure.  Will try to get this scheduled for 02/09/2024.  If your CT scan of the chest just prior to that procedure shows improvement then we may be able to defer bronchoscopy and follow for resolution.

## 2024-01-30 NOTE — Progress Notes (Signed)
 Subjective:    Patient ID: Peter [name], male    DOB: 06-02-1946, 78 y.o.   MRN: [id number]  HPI  History of Present Illness   Peter [name] is a 78 year old male with an abnormal CT scan of the chest who presents with respiratory symptoms. He is accompanied by his wife, [name].  The CT scan from December 22, 2023, revealed dense areas of mass-like consolidation in the right upper lobe at the apex and in the superior segment of the right lower lobe with central cavitation and bronchiectatic change. There was also patchy left lower lobe ground glass consolidation. A repeat CT scan on January 22, 2024, showed persistent consolidation and cavitary process in the right upper lobe and superior segment of the right lower lobe with very little change in the interval.  He experienced improvement in breathing after hospitalization and used an albuterol inhaler four times a day until last week. Initially, he was coughing up 'little oysters' but has not expectorated anything for about three weeks. He still experiences dyspnea with minimal exertion and struggles to reach 1200 on the incentive spirometer, which is set at 1500.  He had COVID-19 in early 2021 and was admitted in early February 2025 with five days of dyspnea and wheezing, diagnosed with influenza A and associated hypoxemia. During hospitalization, a CT chest showed bilateral infiltrates and consolidated pneumonias, and he was treated for superimposed bacterial pneumonia. In early March, he was readmitted with hypertension and hyperglycemia after taking prednisone for gout. A repeat CT scan showed persistent consolidative changes.  He was diagnosed with hypertension and hyperglycemia during his second hospitalization, attributed to prednisone use for gout. His glucose was reported to be 500, but it decreased to 116 by discharge. He has a history of hypertension, atrial fibrillation, CAD with stent placement, CHF, hyperlipidemia, and sleep  apnea managed with BiPAP.  He has never smoked and has no known exposure to tuberculosis, black mold, or other inhaled toxins. 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        Results   RADIOLOGY CT chest: Dense areas of mass-like consolidation in the right upper lobe at the apex and in the superior segment of the right lower lobe with central cavitation and bronchiectatic change suggestive of necrotic pneumonia versus malignancy. Patchy left lower lobe ground-glass consolidation. (12/22/2023) CT chest: No evidence of pulmonary embolism. Mild mediastinal adenopathy in right paratracheal node measuring up to 1.2 cm. Persistent consolidation and cavitary process in the right upper lobe and superior segment of the right lower lobe with very little change in the interval. (01/22/2024)       Review of Systems As per HPI  Past Medical History:  Diagnosis Date   Arthritis    At risk for sleep apnea    STOP-BANG= 5       SENT TO PCP 07-12-2015   CAD (coronary artery disease)    a. s/p PCI with DES x 3 to LAD in 2006 with bifurcation lesion and Plavix indefinitely was recommended.   Chronic combined systolic and diastolic CHF (congestive heart failure) (HCC)    a. Prior EF normal but EF dropped to 30-35% in 07/2016 in setting of AFIB.   CKD (chronic kidney disease), stage III (HCC)    Depression  Dyslipidemia    Dyspnea    tired quick d/t Afib   Dyspnea on exertion    Dysrhythmia    A.FIB   GERD (gastroesophageal reflux disease)    History of GI bleed    History of kidney stones    History of MI (myocardial infarction)    11-05-2005--  periprocedural MI  (cardiac cath) per dr Juanda Chance note   Hypertension    Hypertriglyceridemia    Iron deficiency anemia    Myocardial infarction [facility])    Persistent atrial fibrillation  (HCC)    a. 07/2016, started on Eliquis -> readmitted later that month for AF RVR/acute HF - initially unsuccessful DCCV following TEE, so loaded with amio with repeat successful DCCV 07/2816.   Right ureteral stone    S/P drug eluting coronary stent placement    x3  to LAD  2006   Sleep apnea    wears BIPAP, 14 up to 24     Family History  Problem Relation Age of Onset   Heart attack Father 29   Congestive Heart Failure Father 75   Cancer Other      Social History   Socioeconomic History   Marital status: Married    Spouse name: Not on file   Number of children: Not on file   Years of education: Not on file   Highest education level: Not on file  Occupational History   Not on file  Tobacco Use   Smoking status: Never   Smokeless tobacco: Never  Vaping Use   Vaping status: Never Used  Substance and Sexual Activity   Alcohol use: No    Comment: 2 beers a year   Drug use: No   Sexual activity: Not on file  Other Topics Concern   Not on file  Social History Narrative   Not on file   Social Drivers of Health   Financial Resource Strain: Not on file  Food Insecurity: No Food Insecurity (01/23/2024)   Hunger Vital Sign    Worried About Running Out of Food in the Last Year: Never true    Ran Out of Food in the Last Year: Never true  Transportation Needs: No Transportation Needs (01/23/2024)   PRAPARE - Administrator, Civil Service (Medical): No    Lack of Transportation (Non-Medical): No  Physical Activity: Not on file  Stress: Not on file  Social Connections: Moderately Integrated (01/23/2024)   Social Connection and Isolation Panel [NHANES]    Frequency of Communication with Friends and Family: Once a week    Frequency of Social Gatherings with Friends and Family: More than three times a week    Attends Religious Services: 1 to 4 times per year    Active Member of [employer] or Organizations: No    Attends Banker Meetings: Never    Marital  Status: Married  Catering manager Violence: Unknown (01/23/2024)   Humiliation, Afraid, Rape, and Kick questionnaire    Fear of Current or Ex-Partner: No    Emotionally Abused: Patient unable to answer    Physically Abused: Patient unable to answer    Sexually Abused: Patient unable to answer     Allergies  Allergen Reactions   Prednisone Other (See Comments)    Made BS increase to 500- discovered on 01/22/24   Spironolactone Other (See Comments)    Tender breast     Outpatient Medications Prior to Visit  Medication Sig Dispense Refill   allopurinol (ZYLOPRIM) 100 MG tablet Take  100 mg by mouth at bedtime.     allopurinol (ZYLOPRIM) 300 MG tablet Take 300 mg by mouth in the morning.     atorvastatin (LIPITOR) 40 MG tablet TAKE 1 TABLET EVERY DAY 90 tablet 1   carvedilol (COREG) 25 MG tablet TAKE 1 TABLET TWICE DAILY WITH MEALS 180 tablet 3   Cyanocobalamin (VITAMIN B-12 PO) Take 1 tablet by mouth daily.     diphenhydramine-acetaminophen (TYLENOL PM) 25-500 MG TABS tablet Take 1 tablet by mouth at bedtime.     ferrous sulfate 325 (65 FE) MG tablet Take 1 tablet (325 mg total) by mouth every morning. 90 tablet 2   furosemide (LASIX) 40 MG tablet TAKE 1 TABLET EVERY DAY 90 tablet 3   lisinopril (ZESTRIL) 40 MG tablet TAKE 1 TABLET EVERY DAY 90 tablet 3   nitroGLYCERIN (NITROSTAT) 0.4 MG SL tablet Place 1 tablet (0.4 mg total) under the tongue every 5 (five) minutes as needed for chest pain. X 3 doses 25 tablet 5   polyvinyl alcohol (LIQUIFILM TEARS) 1.4 % ophthalmic solution Place 1 drop into both eyes daily as needed for dry eyes.     rivaroxaban (XARELTO) 20 MG TABS tablet Take 1 tablet (20 mg total) by mouth daily. (Patient taking differently: Take 20 mg by mouth at bedtime.) 30 tablet 1   albuterol (VENTOLIN HFA) 108 (90 Base) MCG/ACT inhaler Inhale 2 puffs into the lungs every 6 (six) hours as needed for wheezing or shortness of breath (intractable cough). (Patient not taking:  Reported on 01/30/2024) 8 g 2   pantoprazole (PROTONIX) 40 MG tablet TAKE 1 TABLET EVERY DAY (NEED MD APPOINTMENT) (Patient not taking: Reported on 01/30/2024) 90 tablet 3   sertraline (ZOLOFT) 25 MG tablet Take 25 mg by mouth daily. (Patient not taking: Reported on 01/30/2024)     No facility-administered medications prior to visit.        Objective:   Physical Exam Vitals:   01/30/24 0829  BP: 108/62  Pulse: 80  SpO2: 95%  Weight: 212 lb (96.2 kg)  Height: 5\' 8"  (1.727 m)    Gen: Pleasant, well-nourished, in no distress,  normal affect  ENT: No lesions,  mouth clear,  oropharynx clear, no postnasal drip  Neck: No JVD, no stridor  Lungs: No use of accessory muscles, no crackles or wheezing on normal respiration, no wheeze on forced expiration  Cardiovascular: RRR, heart sounds normal, no murmur or gallops, no peripheral edema  Musculoskeletal: No deformities, no cyanosis or clubbing  Neuro: alert, awake, non focal  Skin: Warm, no lesions or rash       Assessment & Plan:   Assessment and Plan    Abnormal chest CT Persistent mass-like consolidation with cavitation in right upper and lower lobes suggests necrotic pneumonia or malignancy. Minimal change on repeat CT. Differential includes inflammatory changes. Discussed CT versus bronchoscopy for further evaluation. Explained bronchoscopy risks including pneumothorax. - Order repeat chest CT scan before bronchoscopy. - Schedule bronchoscopy for February 09, 2024, with potential biopsy and washings. - Hold Xarelto for at least two days prior to bronchoscopy. - Discuss potential risks of bronchoscopy, including pneumothorax.  Sleep Apnea Managed with BiPAP therapy. No specific discussion noted.        Levy Pupa, MD, PhD 01/30/2024, 8:42 AM [facility] [phone] or if no answer before 7:00PM call [phone] For any issues after 7:00PM please call eLink [phone]

## 2024-01-30 NOTE — H&P (View-Only) (Signed)
 Subjective:    Patient ID: Peter [name], male    DOB: 06-02-1946, 78 y.o.   MRN: [id number]  HPI  History of Present Illness   Peter [name] is a 78 year old male with an abnormal CT scan of the chest who presents with respiratory symptoms. He is accompanied by his wife, Peter [name].  The CT scan from December 22, 2023, revealed dense areas of mass-like consolidation in the right upper lobe at the apex and in the superior segment of the right lower lobe with central cavitation and bronchiectatic change. There was also patchy left lower lobe ground glass consolidation. A repeat CT scan on January 22, 2024, showed persistent consolidation and cavitary process in the right upper lobe and superior segment of the right lower lobe with very little change in the interval.  He experienced improvement in breathing after hospitalization and used an albuterol inhaler four times a day until last week. Initially, he was coughing up 'little oysters' but has not expectorated anything for about three weeks. He still experiences dyspnea with minimal exertion and struggles to reach 1200 on the incentive spirometer, which is set at 1500.  He had COVID-19 in early 2021 and was admitted in early February 2025 with five days of dyspnea and wheezing, diagnosed with influenza A and associated hypoxemia. During hospitalization, a CT chest showed bilateral infiltrates and consolidated pneumonias, and he was treated for superimposed bacterial pneumonia. In early March, he was readmitted with hypertension and hyperglycemia after taking prednisone for gout. A repeat CT scan showed persistent consolidative changes.  He was diagnosed with hypertension and hyperglycemia during his second hospitalization, attributed to prednisone use for gout. His glucose was reported to be 500, but it decreased to 116 by discharge. He has a history of hypertension, atrial fibrillation, CAD with stent placement, CHF, hyperlipidemia, and sleep  apnea managed with BiPAP.  He has never smoked and has no known exposure to tuberculosis, black mold, or other inhaled toxins. 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        Results   RADIOLOGY CT chest: Dense areas of mass-like consolidation in the right upper lobe at the apex and in the superior segment of the right lower lobe with central cavitation and bronchiectatic change suggestive of necrotic pneumonia versus malignancy. Patchy left lower lobe ground-glass consolidation. (12/22/2023) CT chest: No evidence of pulmonary embolism. Mild mediastinal adenopathy in right paratracheal node measuring up to 1.2 cm. Persistent consolidation and cavitary process in the right upper lobe and superior segment of the right lower lobe with very little change in the interval. (01/22/2024)       Review of Systems As per HPI  Past Medical History:  Diagnosis Date   Arthritis    At risk for sleep apnea    STOP-BANG= 5       SENT TO PCP 07-12-2015   CAD (coronary artery disease)    a. s/p PCI with DES x 3 to LAD in 2006 with bifurcation lesion and Plavix indefinitely was recommended.   Chronic combined systolic and diastolic CHF (congestive heart failure) (HCC)    a. Prior EF normal but EF dropped to 30-35% in 07/2016 in setting of AFIB.   CKD (chronic kidney disease), stage III (HCC)    Depression  Dyslipidemia    Dyspnea    tired quick d/t Afib   Dyspnea on exertion    Dysrhythmia    A.FIB   GERD (gastroesophageal reflux disease)    History of GI bleed    History of kidney stones    History of MI (myocardial infarction)    11-05-2005--  periprocedural MI  (cardiac cath) per dr Juanda Chance note   Hypertension    Hypertriglyceridemia    Iron deficiency anemia    Myocardial infarction [facility])    Persistent atrial fibrillation  (HCC)    a. 07/2016, started on Eliquis -> readmitted later that month for AF RVR/acute HF - initially unsuccessful DCCV following TEE, so loaded with amio with repeat successful DCCV 07/2816.   Right ureteral stone    S/P drug eluting coronary stent placement    x3  to LAD  2006   Sleep apnea    wears BIPAP, 14 up to 24     Family History  Problem Relation Age of Onset   Heart attack Father 29   Congestive Heart Failure Father 75   Cancer Other      Social History   Socioeconomic History   Marital status: Married    Spouse name: Not on file   Number of children: Not on file   Years of education: Not on file   Highest education level: Not on file  Occupational History   Not on file  Tobacco Use   Smoking status: Never   Smokeless tobacco: Never  Vaping Use   Vaping status: Never Used  Substance and Sexual Activity   Alcohol use: No    Comment: 2 beers a year   Drug use: No   Sexual activity: Not on file  Other Topics Concern   Not on file  Social History Narrative   Not on file   Social Drivers of Health   Financial Resource Strain: Not on file  Food Insecurity: No Food Insecurity (01/23/2024)   Hunger Vital Sign    Worried About Running Out of Food in the Last Year: Never true    Ran Out of Food in the Last Year: Never true  Transportation Needs: No Transportation Needs (01/23/2024)   PRAPARE - Administrator, Civil Service (Medical): No    Lack of Transportation (Non-Medical): No  Physical Activity: Not on file  Stress: Not on file  Social Connections: Moderately Integrated (01/23/2024)   Social Connection and Isolation Panel [NHANES]    Frequency of Communication with Friends and Family: Once a week    Frequency of Social Gatherings with Friends and Family: More than three times a week    Attends Religious Services: 1 to 4 times per year    Active Member of [employer] or Organizations: No    Attends Banker Meetings: Never    Marital  Status: Married  Catering manager Violence: Unknown (01/23/2024)   Humiliation, Afraid, Rape, and Kick questionnaire    Fear of Current or Ex-Partner: No    Emotionally Abused: Patient unable to answer    Physically Abused: Patient unable to answer    Sexually Abused: Patient unable to answer     Allergies  Allergen Reactions   Prednisone Other (See Comments)    Made BS increase to 500- discovered on 01/22/24   Spironolactone Other (See Comments)    Tender breast     Outpatient Medications Prior to Visit  Medication Sig Dispense Refill   allopurinol (ZYLOPRIM) 100 MG tablet Take  100 mg by mouth at bedtime.     allopurinol (ZYLOPRIM) 300 MG tablet Take 300 mg by mouth in the morning.     atorvastatin (LIPITOR) 40 MG tablet TAKE 1 TABLET EVERY DAY 90 tablet 1   carvedilol (COREG) 25 MG tablet TAKE 1 TABLET TWICE DAILY WITH MEALS 180 tablet 3   Cyanocobalamin (VITAMIN B-12 PO) Take 1 tablet by mouth daily.     diphenhydramine-acetaminophen (TYLENOL PM) 25-500 MG TABS tablet Take 1 tablet by mouth at bedtime.     ferrous sulfate 325 (65 FE) MG tablet Take 1 tablet (325 mg total) by mouth every morning. 90 tablet 2   furosemide (LASIX) 40 MG tablet TAKE 1 TABLET EVERY DAY 90 tablet 3   lisinopril (ZESTRIL) 40 MG tablet TAKE 1 TABLET EVERY DAY 90 tablet 3   nitroGLYCERIN (NITROSTAT) 0.4 MG SL tablet Place 1 tablet (0.4 mg total) under the tongue every 5 (five) minutes as needed for chest pain. X 3 doses 25 tablet 5   polyvinyl alcohol (LIQUIFILM TEARS) 1.4 % ophthalmic solution Place 1 drop into both eyes daily as needed for dry eyes.     rivaroxaban (XARELTO) 20 MG TABS tablet Take 1 tablet (20 mg total) by mouth daily. (Patient taking differently: Take 20 mg by mouth at bedtime.) 30 tablet 1   albuterol (VENTOLIN HFA) 108 (90 Base) MCG/ACT inhaler Inhale 2 puffs into the lungs every 6 (six) hours as needed for wheezing or shortness of breath (intractable cough). (Patient not taking:  Reported on 01/30/2024) 8 g 2   pantoprazole (PROTONIX) 40 MG tablet TAKE 1 TABLET EVERY DAY (NEED MD APPOINTMENT) (Patient not taking: Reported on 01/30/2024) 90 tablet 3   sertraline (ZOLOFT) 25 MG tablet Take 25 mg by mouth daily. (Patient not taking: Reported on 01/30/2024)     No facility-administered medications prior to visit.        Objective:   Physical Exam Vitals:   01/30/24 0829  BP: 108/62  Pulse: 80  SpO2: 95%  Weight: 212 lb (96.2 kg)  Height: 5\' 8"  (1.727 m)    Gen: Pleasant, well-nourished, in no distress,  normal affect  ENT: No lesions,  mouth clear,  oropharynx clear, no postnasal drip  Neck: No JVD, no stridor  Lungs: No use of accessory muscles, no crackles or wheezing on normal respiration, no wheeze on forced expiration  Cardiovascular: RRR, heart sounds normal, no murmur or gallops, no peripheral edema  Musculoskeletal: No deformities, no cyanosis or clubbing  Neuro: alert, awake, non focal  Skin: Warm, no lesions or rash       Assessment & Plan:   Assessment and Plan    Abnormal chest CT Persistent mass-like consolidation with cavitation in right upper and lower lobes suggests necrotic pneumonia or malignancy. Minimal change on repeat CT. Differential includes inflammatory changes. Discussed CT versus bronchoscopy for further evaluation. Explained bronchoscopy risks including pneumothorax. - Order repeat chest CT scan before bronchoscopy. - Schedule bronchoscopy for February 09, 2024, with potential biopsy and washings. - Hold Xarelto for at least two days prior to bronchoscopy. - Discuss potential risks of bronchoscopy, including pneumothorax.  Sleep Apnea Managed with BiPAP therapy. No specific discussion noted.        Levy Pupa, MD, PhD 01/30/2024, 8:42 AM [facility] [phone] or if no answer before 7:00PM call [phone] For any issues after 7:00PM please call eLink [phone]

## 2024-02-02 ENCOUNTER — Telehealth: Payer: Self-pay | Admitting: Emergency Medicine

## 2024-02-02 NOTE — Telephone Encounter (Signed)
 Patient scheduled for Bronch procedure 02/09/2024. Patient states [facility] cancelled CT scan due to not covered by insurance. Insurance stated approved CT scan. Patient phone number is [phone].

## 2024-02-02 NOTE — Telephone Encounter (Signed)
 Spoke to the patient and I got them resc for the appt

## 2024-02-03 ENCOUNTER — Ambulatory Visit (HOSPITAL_COMMUNITY)

## 2024-02-03 ENCOUNTER — Encounter (HOSPITAL_COMMUNITY): Payer: Self-pay

## 2024-02-05 ENCOUNTER — Ambulatory Visit (HOSPITAL_COMMUNITY)
Admission: RE | Admit: 2024-02-05 | Discharge: 2024-02-05 | Disposition: A | Discharge status: Home / Self Care | Source: Ambulatory Visit | Attending: Family Medicine | Admitting: Family Medicine

## 2024-02-05 ENCOUNTER — Encounter (HOSPITAL_COMMUNITY): Payer: Self-pay | Admitting: Emergency Medicine

## 2024-02-05 ENCOUNTER — Other Ambulatory Visit: Payer: Self-pay

## 2024-02-05 DIAGNOSIS — R9389 Abnormal findings on diagnostic imaging of other specified body structures: Secondary | ICD-10-CM | POA: Diagnosis not present

## 2024-02-05 DIAGNOSIS — J849 Interstitial pulmonary disease, unspecified: Secondary | ICD-10-CM | POA: Diagnosis not present

## 2024-02-05 DIAGNOSIS — C3431 Malignant neoplasm of lower lobe, right bronchus or lung: Secondary | ICD-10-CM | POA: Diagnosis not present

## 2024-02-05 NOTE — Pre-Procedure Instructions (Signed)
-------------    SDW INSTRUCTIONS given:  Your procedure is scheduled on 3/31.  Report to [facility] Main Entrance "A" at 05:30 A.M., and check in at the Admitting office.  Any questions or running late day of surgery: call [phone]    Remember:  Do not eat or drink after midnight the night before your surgery    Take these medicines the morning of surgery with A SIP OF WATER  Allopurinol Atorvastatin Coreg       May take these medicines IF NEEDED: Nitroglycerin-  If you have to take this medication prior to surgery, please call [phone] and report this to a nurse  liquifilm tears    As of today, STOP taking any Aspirin (unless otherwise instructed by your surgeon) Aleve, Naproxen, Ibuprofen, Motrin, Advil, Goody's, BC's, all herbal medications, fish oil, and all vitamins.   Do NOT Smoke (Tobacco/Vaping) 24 hours prior to your procedure  If you use a CPAP at night, you may bring all equipment for your overnight stay.     You will be asked to remove any contacts, glasses, piercing's, hearing aid's, dentures/partials prior to surgery. Please bring cases for these items if needed.     Patients discharged the day of surgery will not be allowed to drive home, and someone needs to stay with them for 24 hours.  SURGICAL WAITING ROOM VISITATION Patients may have no more than 2 support people in the waiting area - these visitors may rotate.   Pre-op nurse will coordinate an appropriate time for 1 ADULT support person, who may not rotate, to accompany patient in pre-op.  Children under the age of 61 must have an adult with them who is not the patient and must remain in the main waiting area with an adult.  If the patient needs to stay at the hospital during part of their recovery, the visitor guidelines for inpatient rooms apply.  Please refer to the [facility] website for the visitor guidelines for any additional information.   Special instructions:   [place]-  Preparing For Surgery   Please follow these instructions carefully.   Shower the NIGHT BEFORE SURGERY and the MORNING OF SURGERY with DIAL Soap.   Pat yourself dry with a CLEAN TOWEL.  Wear CLEAN PAJAMAS to bed the night before surgery  Place CLEAN SHEETS on your bed the night of your first shower and DO NOT SLEEP WITH PETS.   Additional instructions for the day of surgery: DO NOT APPLY any lotions, deodorants, cologne, or perfumes.   Do not wear jewelry or makeup Do not wear nail polish, gel polish, artificial nails, or any other type of covering on natural nails (fingers and toes) Do not bring valuables to the hospital. [facility] is not responsible for valuables/personal belongings. Put on clean/comfortable clothes.  Please brush your teeth.  Ask your nurse before applying any prescription medications to the skin.

## 2024-02-05 NOTE — Progress Notes (Signed)
 PCP - Dr. Carilyn Goodpasture Koirala Cardiologist - Dr. Nicki Guadalajara EP-Dr. Loman Brooklyn  PPM/ICD - denies   Chest x-ray - 12/22/23 EKG - 01/22/24 Stress Test - 01/06/13 ECHO - 12/23/23 Cardiac Cath - 10/2005  CPAP - OSA+, uses BiPap- pressure settings 12-21  DM- denies  Blood Thinner Instructions: Hold Xarelto 2 days. Last dose 3/28 Aspirin Instructions: n/a  ERAS Protcol - no,NPO  COVID TEST- n/a  Anesthesia review: yes, cardiac hx  Patient verbally denies any shortness of breath, fever, cough and chest pain during phone call     Questions were answered. Patient verbalized understanding of instructions.

## 2024-02-06 NOTE — Progress Notes (Signed)
 Anesthesia Chart Review:  Case: [id number] Date/Time: 02/09/24 0730   Procedure: VIDEO BRONCHOSCOPY WITH ENDOBRONCHIAL NAVIGATION (Right) - WITH FLOURO   Anesthesia type: General   Diagnosis:      Mass of upper lobe of lung [R91.8]     Right lower lobe lung mass [R91.8]   Pre-op diagnosis: RIGHT UPPER LOBE MASS AND RIGHT LOWER MASS   [facility] / [facility]   Surgeons: Leslye Peer, MD       DISCUSSION: Patient is a 78 year old male scheduled for the above procedure.   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   Last EP visit was on 06/06/23 with Dr. Elberta Fortis. He was doing well. No further afib episodes. EF had been 30-35% in 07/2016 in setting of afib/tachycardia. 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 (Overlapping DES to LAD/DIAG in 2007.) One year follow-up recommended. He had sleep medicine follow-up with cardiologist Dr. Tresa Endo on 06/04/23.  [facility] admission 12/19/23 - 12/25/23 for influenza A with hypoxemia. CT scan on 12/22/23 showed "consolidation right apex and superior segment of the right lower lobe, central cavitation and bronchiectasis.Marland KitchenMarland KitchenMarland KitchenPossible mucous plugging/postviral pneumonia." He improved on vancomycin and azithromycin. Also given Tamiflu. Supplement O2 weaned by discharge. Echo on 12/23/23 showed LVEF 55 to 60%,  basal inferolateral segment hypokinesis, moderate LVH, grade II DD, normal RV systolic function, moderate RVH, mildly elevated PASP, RVSP 41.7 mmHg, trivial MR. By Discharge Summary, chronic  combined HF clinically stable, euvolemic. Out-patient pulmonology with repeat chest imagine recommended.   Readmission 01/22/24 - 01/23/24 for weakness and more sudden onset of dyspnea with home BP 180/112, HR 108.  He had been started on prednisone 3 days prior to acute gout. He contacted his PCP office who advised to present to the ED. Found to have glucose > 500 with anion gap of 18 treated with insulin and IVF. A1c 6.0%. Troponins 17, 24 and 33. He denied chest pain. He was admitted for hyperglycemia likely due to recent use of prednisone for gout and for uncontrolled hypertension possibly exacerbated by prednisone use. CTA Chest was negative for acute PE but showed abrupt occlusion of the RUL pulmonary artery without significant enhancement in RUL parenchymal abnormality, with imaging appearance suggesting chronic occlusion and also showed a masslike cavitary and consolidative abnormality in the RUL and superior RLL. Given lack to interval change, concern for neoplasm and pulmonary consultation recommended.   He was evaluated by Dr. Delton Coombes on 01/30/24 and above procedure recommended. He instructed patient to hold Xarelto for at least two days prior to bronchoscopy.   Anesthesia team to evaluate on the day of surgery.    VS: Ht 5\' 8"  (1.727 m)   Wt 96.2 kg   BMI 32.23 kg/m  BP Readings from Last 3 Encounters:  01/30/24 108/62  01/23/24 (!) 154/93  12/25/23 133/79   Pulse Readings from Last 3 Encounters:  01/30/24 80  01/23/24 67  12/25/23 (!) 58     PROVIDERS: Koirala, [name], MD is PCP  - Donato Schultz, MD is cardiologist, although he is primarily followed by Dr. Elberta Fortis and Dr. Tresa Endo since 2018.  -  Loman Brooklyn, MD is EP cardiologist - Nicki Guadalajara, MD is cardiologist (for OSA).      LABS: Most recent results in [facility] include: Lab Results  Component Value Date   WBC 13.2 (H) 01/23/2024   HGB 11.0 (L) 01/23/2024   HCT 32.8 (L) 01/23/2024   PLT 163 01/23/2024   GLUCOSE 241 (H)  01/23/2024   ALT 25 01/23/2024   AST 25 01/23/2024   NA 136 01/23/2024   K 4.0 01/23/2024   CL 104 01/23/2024   CREATININE 0.94 01/23/2024   BUN 24 (H) 01/23/2024   CO2 19 (L) 01/23/2024   TSH 1.338 01/23/2024   HGBA1C 6.0 (H) 01/23/2024     IMAGES: CT Super D Chest 02/05/24: Report pending.  CTA Chest 01/22/24: IMPRESSION: 1. No definite acute pulmonary embolus is seen. 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 2. Little change in masslike cavitary and consolidative abnormalities in the right upper lobe and superior right lower lobe. Given lack of interval change, neoplasm is of concern and pulmonary consultation is recommended if not already performed 3. 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 4. Similar mild mediastinal adenopathy 5. Moderate hiatal hernia - Aortic Atherosclerosis (ICD10-I70.0).  CT Chest 12/22/23: IMPRESSION: 1. Dense areas of masslike consolidation within the right apex and superior segment right lower lobe, with areas of central cavitation and bronchiectasis. Given clinical presentation, findings are consistent with cavitating or necrotizing pneumonia. However, continued follow-up is recommended to document resolution and exclude underlying neoplasm. 2. Patchy left lower lobe ground-glass consolidation, which may reflect additional focus of pneumonia versus hypoventilatory change. 3. Basilar predominant interstitial lung disease, with progressive subpleural scarring since previous exams. 4. Trace bilateral pleural effusions. 5. Mediastinal lymphadenopathy. 6. Cardiomegaly. 7. Moderate hiatal hernia. 8. Incidental cholelithiasis without evidence of acute cholecystitis. 9. Aortic Atherosclerosis (ICD10-I70.0). Coronary artery atherosclerosis.     EKG: 01/22/24: Sinus rhythm with Premature supraventricular complexes Right bundle branch block Septal infarct , age undetermined Abnormal ECG Confirmed by Gerhard Munch [phone]) on 01/23/2024 10:49:41 PM   CV: Echo 12/23/23: IMPRESSIONS   1. Left ventricular ejection fraction, by estimation, is 55 to 60%. The  left ventricle has normal function. The left ventricle demonstrates basal inferolateral segment hypokinesis. There is moderate concentric  left ventricular hypertrophy. Left ventricular diastolic parameters are  consistent with Grade II diastolic dysfunction (pseudonormalization).   2. Right ventricular systolic function is normal. The right ventricular  size is moderately enlarged. There is mildly elevated pulmonary artery  systolic pressure. The estimated right ventricular systolic pressure is  41.7 mmHg.   3. The mitral valve is grossly normal. Trivial mitral valve  regurgitation. No evidence of mitral stenosis.   4. The aortic valve is tricuspid. Aortic valve regurgitation is not  visualized. No aortic stenosis is present.   5. The inferior vena cava is normal in size with greater than 50%  respiratory variability, suggesting right atrial pressure of 3 mmHg.  - Comparison: 11/22/16 EF 55-60%, no PFO; 08/05/16 EF 30-35% with diffuse LV hypokinesis, felt likely non-ischemic in setting of afib/tachycardia; 07/17/16 EF 55-60%     CT cardiac morphology 10/23/16:  IMPRESSION: 1) No LAA thrombus 2) Normal pulmonary veins with no anomaly measurements as above 3) Moderate biatrial enlargement 4) No pericardial effusion 5) Esophagus courses closest to the LLPV ostium     Nuclear stress test 01/06/13:  -  Normal stress nuclear study. - LV Ejection Fraction: 57%.  LV Wall Motion:  NL LV Function; NL Wall Motion     Cardiac cath 11/13/05:  RESULTS: 1.  Initially, the LAD stent appeared to be widely patent.  The diagonal branch had TIMI-III flow with ostium that appeared to be 80%  narrowed. The IVUS measurements in the stent showed that the distal reference was about 3.0, and there were areas within the stent where the stent diameter was about 2.2-2.5.  For this reason, we elected to post-dilate this.  We did not perform a final IVUS after the post-dilatation, but angiographically it looked quite good. 2.  The stenosis in the ramus branch was initially estimated at 80% and following cutting balloon angioplasty, this improved to less than 20%. CONCLUSIONS: 1.  Successful touch up post-dilatation of the three tandem overlying Cypher stents in the proximal to mid-LAD based on IVUS measurements with 0% stenosis angiographically at the end of the procedure and a patent diagonal branch with TIMI-III flow. 2.  Successful cutting balloon angioplasty of the ramus branch of the circumflex artery with improvement in percent narrowing from 80% to less than 20%. DISPOSITION: The patient will remain on Plavix long term with three overlapping drug-eluting stents in the LAD. - Plavix discontinued 07/26/16 after he developed a GI bleed while on both Plavix and Xarelto (which was started ~ 07/16/16 due to afib)   Past Medical History:  Diagnosis Date   Arthritis    At risk for sleep apnea    STOP-BANG= 5       SENT TO PCP 07-12-2015   CAD (coronary artery disease)    a. s/p PCI with DES x 3 to LAD in 2006 with bifurcation lesion and Plavix indefinitely was recommended.   Chronic combined systolic and diastolic CHF (congestive heart failure) (HCC)    a. Prior EF normal but EF dropped to 30-35% in 07/2016 in setting of AFIB.   CKD (chronic kidney disease), stage III (HCC)    Depression    Dyslipidemia    Dyspnea    tired quick d/t Afib   Dyspnea on exertion    Dysrhythmia    A.FIB   GERD (gastroesophageal reflux disease)    History of GI bleed    History of kidney stones    History of MI (myocardial infarction)    11-05-2005--  periprocedural MI  (cardiac cath) per dr Juanda Chance note    Hypertension    Hypertriglyceridemia    Iron deficiency anemia    Myocardial infarction [facility])    Persistent atrial fibrillation (HCC)    a. 07/2016, started on Eliquis -> readmitted later that month for AF RVR/acute HF - initially unsuccessful DCCV following TEE, so loaded with amio with repeat successful DCCV 07/2816.   Right ureteral stone    S/P drug eluting coronary stent placement    x3  to LAD  2006   Sleep apnea    wears BIPAP, 14 up to 24    Past Surgical History:  Procedure Laterality Date   CARDIOVASCULAR STRESS TEST  01-06-2013  dr Myrtis Ser   normal perfusion study/  no ischemia or infarct/  normal LV function and wall motion , ef 57%   CARDIOVERSION N/A 08/05/2016   Procedure: CARDIOVERSION;  Surgeon: Thurmon Fair, MD;  [facility];  Service: Cardiovascular;  Laterality: N/A;   CARDIOVERSION N/A 08/08/2016   Procedure: CARDIOVERSION;  Surgeon: Jake Bathe, MD;  Location: [facility] ENDOSCOPY;  Service: Cardiovascular;  Laterality: N/A;  CORONARY ANGIOPLASTY  11-13-2005 dr Juanda Chance   Successful touch up post-dilatation of the 3 tandem overlying Cypher stents in proximal to mid LAD (based on IVUS 0% stenosis and patent diagonal branch)/  Cutting Balloon Angioplasty of Ramus branch   CORONARY ANGIOPLASTY WITH STENT PLACEMENT  11-05-2005   dr Smitty Cords brodie   PCI and DES x3 to birfurcation lesion LAD and diagonal branch of LAD/   20% dLM,  30-40% RCA, ef 60%   CYSTOSCOPY WITH RETROGRADE PYELOGRAM, URETEROSCOPY AND STENT PLACEMENT Right 07/14/2015   Procedure: CYSTOSCOPY WITH RETROGRADE PYELOGRAM, URETEROSCOPY AND STENT PLACEMENT;  Surgeon: Sebastian Ache, MD;  Location[facility];  Service: Urology;  Laterality: Right;   ELECTROPHYSIOLOGIC STUDY N/A 10/24/2016   Procedure: Atrial Fibrillation Ablation;  Surgeon: Will Jorja Loa, MD;  [facility] INV[facility];  Service: Cardiovascular;  Laterality: N/A;   EXTRACORPOREAL SHOCK WAVE LITHOTRIPSY  yrs ago    HEMORRHOID SURGERY N/A 10/24/2017   Procedure: HEMORRHOIDECTOMY;  Surgeon: Andria Meuse, MD;  Location: [facility];  Service: General;  Laterality: N/A;   HERNIA REPAIR Bilateral 2000   Inguinal Hernia   IR GENERIC HISTORICAL  10/23/2016   IR FLUORO GUIDE CV LINE RIGHT 10/23/2016 Berdine Dance, MD [facility]   IR GENERIC HISTORICAL  10/23/2016   IR US GUIDE VASC ACCESS RIGHT 10/23/2016 Berdine Dance, MD [facility]   LEFT URETEROSCOPIC STONE EXTRACTION  04/10/2006   STONE EXTRACTION WITH BASKET Right 07/14/2015   Procedure: STONE EXTRACTION WITH BASKET;  Surgeon: Sebastian Ache, MD;  Location: [facility];  Service: Urology;  Laterality: Right;   TEE WITHOUT CARDIOVERSION N/A 08/05/2016   Procedure: TRANSESOPHAGEAL ECHOCARDIOGRAM (TEE);  Surgeon: Thurmon Fair, MD;  Location: [facility] ENDOSCOPY;  Service: Cardiovascular;  Laterality: N/A;   TOTAL HIP ARTHROPLASTY Right 07/16/2022   Procedure: RIGHT TOTAL HIP ARTHROPLASTY ANTERIOR APPROACH;  Surgeon: Tarry Kos, MD;  Location: [facility];  Service: Orthopedics;  Laterality: Right;  3-C   TRANSTHORACIC ECHOCARDIOGRAM  02/04/2012   grade I diastolic dysfunction/  ef 55-60%/  trivial MR and TR/  mild LAE    MEDICATIONS: No current facility-administered medications for this encounter.    albuterol (VENTOLIN HFA) 108 (90 Base) MCG/ACT inhaler   allopurinol (ZYLOPRIM) 100 MG tablet   allopurinol (ZYLOPRIM) 300 MG tablet   atorvastatin (LIPITOR) 40 MG tablet   carvedilol (COREG) 25 MG tablet   Cyanocobalamin (VITAMIN B-12 PO)   diphenhydramine-acetaminophen (TYLENOL PM) 25-500 MG TABS tablet   ferrous sulfate 325 (65 FE) MG tablet   furosemide (LASIX) 40 MG tablet   lisinopril (ZESTRIL) 40 MG tablet   nitroGLYCERIN (NITROSTAT) 0.4 MG SL tablet   pantoprazole (PROTONIX) 40 MG tablet   polyvinyl alcohol (LIQUIFILM TEARS) 1.4 % ophthalmic solution   rivaroxaban (XARELTO) 20 MG TABS tablet   sertraline (ZOLOFT) 25 MG tablet     Shonna Chock, PA-C Surgical Short Stay/Anesthesiology [facility] Phone [phone] [facility] Phone [phone] 02/06/2024 4:00 PM

## 2024-02-06 NOTE — Anesthesia Preprocedure Evaluation (Addendum)
 Anesthesia Evaluation  Patient identified by MRN, date of birth, ID band Patient awake    Reviewed: Allergy & Precautions, NPO status , Patient's Chart, lab work & pertinent test results, reviewed documented beta blocker date and time   History of Anesthesia Complications Negative for: history of anesthetic complications  Airway Mallampati: III  TM Distance: >3 FB   Mouth opening: Limited Mouth Opening  Dental no notable dental hx.    Pulmonary shortness of breath and with exertion, sleep apnea , neg COPD, Recent URI  (influenza feb 2025)   breath sounds clear to auscultation + decreased breath sounds      Cardiovascular hypertension, + CAD, + Past MI, + Cardiac Stents, +CHF and + DOE  + dysrhythmias Atrial Fibrillation  Rhythm:Regular Rate:Normal  Last TTE with normal BiV function   Neuro/Psych neg Seizures PSYCHIATRIC DISORDERS  Depression       GI/Hepatic ,GERD  ,,(+) neg Cirrhosis        Endo/Other    Renal/GU CRFRenal disease     Musculoskeletal  (+) Arthritis ,    Abdominal   Peds  Hematology  (+) Blood dyscrasia, anemia   Anesthesia Other Findings   Reproductive/Obstetrics                              Anesthesia Physical Anesthesia Plan  ASA: 3  Anesthesia Plan: General   Post-op Pain Management:    Induction:   PONV Risk Score and Plan: 2 and Ondansetron  Airway Management Planned: Video Laryngoscope Planned  Additional Equipment:   Intra-op Plan:   Post-operative Plan: Extubation in OR  Informed Consent: I have reviewed the patients History and Physical, chart, labs and discussed the procedure including the risks, benefits and alternatives for the proposed anesthesia with the patient or authorized representative who has indicated his/her understanding and acceptance.     Dental advisory given  Plan Discussed with:   Anesthesia Plan Comments: (PAT note  written 02/06/2024 by Shonna Chock, PA-C.  )        Anesthesia Quick Evaluation

## 2024-02-09 ENCOUNTER — Other Ambulatory Visit: Payer: Self-pay

## 2024-02-09 ENCOUNTER — Ambulatory Visit (HOSPITAL_COMMUNITY): Payer: Self-pay | Admitting: Vascular Surgery

## 2024-02-09 ENCOUNTER — Ambulatory Visit (HOSPITAL_COMMUNITY)
Admission: RE | Admit: 2024-02-09 | Discharge: 2024-02-09 | Disposition: A | Discharge status: Home / Self Care | Attending: Emergency Medicine | Admitting: Emergency Medicine

## 2024-02-09 ENCOUNTER — Encounter (HOSPITAL_COMMUNITY): Payer: Self-pay | Admitting: Emergency Medicine

## 2024-02-09 ENCOUNTER — Ambulatory Visit (HOSPITAL_COMMUNITY)

## 2024-02-09 ENCOUNTER — Ambulatory Visit (HOSPITAL_BASED_OUTPATIENT_CLINIC_OR_DEPARTMENT_OTHER): Payer: Self-pay | Admitting: Vascular Surgery

## 2024-02-09 ENCOUNTER — Encounter (HOSPITAL_COMMUNITY)
Admission: RE | Disposition: A | Discharge status: Home / Self Care | Payer: Self-pay | Source: Home / Self Care | Attending: Emergency Medicine

## 2024-02-09 DIAGNOSIS — I5021 Acute systolic (congestive) heart failure: Secondary | ICD-10-CM

## 2024-02-09 DIAGNOSIS — I130 Hypertensive heart and chronic kidney disease with heart failure and stage 1 through stage 4 chronic kidney disease, or unspecified chronic kidney disease: Secondary | ICD-10-CM | POA: Diagnosis not present

## 2024-02-09 DIAGNOSIS — C3431 Malignant neoplasm of lower lobe, right bronchus or lung: Secondary | ICD-10-CM | POA: Insufficient documentation

## 2024-02-09 DIAGNOSIS — R0609 Other forms of dyspnea: Secondary | ICD-10-CM | POA: Diagnosis not present

## 2024-02-09 DIAGNOSIS — Z48813 Encounter for surgical aftercare following surgery on the respiratory system: Secondary | ICD-10-CM | POA: Diagnosis not present

## 2024-02-09 DIAGNOSIS — K449 Diaphragmatic hernia without obstruction or gangrene: Secondary | ICD-10-CM | POA: Insufficient documentation

## 2024-02-09 DIAGNOSIS — K219 Gastro-esophageal reflux disease without esophagitis: Secondary | ICD-10-CM | POA: Insufficient documentation

## 2024-02-09 DIAGNOSIS — I2510 Atherosclerotic heart disease of native coronary artery without angina pectoris: Secondary | ICD-10-CM

## 2024-02-09 DIAGNOSIS — Z79899 Other long term (current) drug therapy: Secondary | ICD-10-CM | POA: Diagnosis not present

## 2024-02-09 DIAGNOSIS — I700 Atherosclerosis of aorta: Secondary | ICD-10-CM | POA: Diagnosis not present

## 2024-02-09 DIAGNOSIS — G4730 Sleep apnea, unspecified: Secondary | ICD-10-CM | POA: Insufficient documentation

## 2024-02-09 DIAGNOSIS — I4819 Other persistent atrial fibrillation: Secondary | ICD-10-CM | POA: Insufficient documentation

## 2024-02-09 DIAGNOSIS — K8020 Calculus of gallbladder without cholecystitis without obstruction: Secondary | ICD-10-CM | POA: Diagnosis not present

## 2024-02-09 DIAGNOSIS — I252 Old myocardial infarction: Secondary | ICD-10-CM | POA: Insufficient documentation

## 2024-02-09 DIAGNOSIS — D631 Anemia in chronic kidney disease: Secondary | ICD-10-CM | POA: Insufficient documentation

## 2024-02-09 DIAGNOSIS — I5042 Chronic combined systolic (congestive) and diastolic (congestive) heart failure: Secondary | ICD-10-CM | POA: Diagnosis not present

## 2024-02-09 DIAGNOSIS — R9389 Abnormal findings on diagnostic imaging of other specified body structures: Secondary | ICD-10-CM

## 2024-02-09 DIAGNOSIS — J479 Bronchiectasis, uncomplicated: Secondary | ICD-10-CM | POA: Insufficient documentation

## 2024-02-09 DIAGNOSIS — R0602 Shortness of breath: Secondary | ICD-10-CM | POA: Diagnosis not present

## 2024-02-09 DIAGNOSIS — M1990 Unspecified osteoarthritis, unspecified site: Secondary | ICD-10-CM | POA: Diagnosis not present

## 2024-02-09 DIAGNOSIS — I491 Atrial premature depolarization: Secondary | ICD-10-CM | POA: Insufficient documentation

## 2024-02-09 DIAGNOSIS — R918 Other nonspecific abnormal finding of lung field: Secondary | ICD-10-CM | POA: Diagnosis not present

## 2024-02-09 DIAGNOSIS — C3491 Malignant neoplasm of unspecified part of right bronchus or lung: Secondary | ICD-10-CM | POA: Diagnosis not present

## 2024-02-09 DIAGNOSIS — N183 Chronic kidney disease, stage 3 unspecified: Secondary | ICD-10-CM | POA: Diagnosis not present

## 2024-02-09 DIAGNOSIS — Z7901 Long term (current) use of anticoagulants: Secondary | ICD-10-CM | POA: Diagnosis not present

## 2024-02-09 DIAGNOSIS — M109 Gout, unspecified: Secondary | ICD-10-CM | POA: Diagnosis not present

## 2024-02-09 DIAGNOSIS — Z955 Presence of coronary angioplasty implant and graft: Secondary | ICD-10-CM | POA: Diagnosis not present

## 2024-02-09 DIAGNOSIS — F32A Depression, unspecified: Secondary | ICD-10-CM | POA: Insufficient documentation

## 2024-02-09 DIAGNOSIS — I480 Paroxysmal atrial fibrillation: Secondary | ICD-10-CM

## 2024-02-09 HISTORY — PX: BRONCHIAL BIOPSY: SHX5109

## 2024-02-09 HISTORY — PX: VIDEO BRONCHOSCOPY WITH ENDOBRONCHIAL NAVIGATION: SHX6175

## 2024-02-09 HISTORY — PX: BRONCHIAL BRUSHINGS: SHX5108

## 2024-02-09 HISTORY — PX: BRONCHIAL NEEDLE ASPIRATION BIOPSY: SHX5106

## 2024-02-09 HISTORY — PX: BRONCHIAL WASHINGS: SHX5105

## 2024-02-09 SURGERY — VIDEO BRONCHOSCOPY WITH ENDOBRONCHIAL NAVIGATION
Anesthesia: General | Laterality: Right

## 2024-02-09 MED ORDER — LIDOCAINE 2% (20 MG/ML) 5 ML SYRINGE
INTRAMUSCULAR | Status: DC | PRN
  Administered 2024-02-09: 100 mg via INTRAVENOUS

## 2024-02-09 MED ORDER — RIVAROXABAN 20 MG PO TABS: 20.0000 mg | ORAL_TABLET | Freq: Every day | ORAL | Status: DC

## 2024-02-09 MED ORDER — ACETAMINOPHEN 10 MG/ML IV SOLN: 1000.0000 mg | Freq: Once | INTRAVENOUS | Status: DC | PRN

## 2024-02-09 MED ORDER — EPHEDRINE SULFATE-NACL 50-0.9 MG/10ML-% IV SOSY
PREFILLED_SYRINGE | INTRAVENOUS | Status: DC | PRN
  Administered 2024-02-09 (×5): 5 mg via INTRAVENOUS

## 2024-02-09 MED ORDER — SUGAMMADEX SODIUM 200 MG/2ML IV SOLN
INTRAVENOUS | Status: DC | PRN
  Administered 2024-02-09: 384.8 mg via INTRAVENOUS

## 2024-02-09 MED ORDER — ONDANSETRON HCL 4 MG/2ML IJ SOLN: 4.0000 mg | Freq: Once | INTRAMUSCULAR | Status: DC | PRN

## 2024-02-09 MED ORDER — ONDANSETRON HCL 4 MG/2ML IJ SOLN
INTRAMUSCULAR | Status: DC | PRN
  Administered 2024-02-09: 4 mg via INTRAVENOUS

## 2024-02-09 MED ORDER — OXYCODONE HCL 5 MG PO TABS: 5.0000 mg | ORAL_TABLET | Freq: Once | ORAL | Status: DC | PRN

## 2024-02-09 MED ORDER — PROPOFOL 10 MG/ML IV BOLUS
INTRAVENOUS | Status: DC | PRN
  Administered 2024-02-09: 150 mg via INTRAVENOUS

## 2024-02-09 MED ORDER — PHENYLEPHRINE HCL-NACL 20-0.9 MG/250ML-% IV SOLN
INTRAVENOUS | Status: DC | PRN
  Administered 2024-02-09: 50 ug/min via INTRAVENOUS

## 2024-02-09 MED ORDER — OXYCODONE HCL 5 MG/5ML PO SOLN: 5.0000 mg | Freq: Once | ORAL | Status: DC | PRN

## 2024-02-09 MED ORDER — FENTANYL CITRATE (PF) 250 MCG/5ML IJ SOLN
INTRAMUSCULAR | Status: DC | PRN
Start: 2024-02-09 — End: 2024-02-09
  Administered 2024-02-09: 50 ug via INTRAVENOUS

## 2024-02-09 MED ORDER — CHLORHEXIDINE GLUCONATE 0.12 % MT SOLN
OROMUCOSAL | Status: DC
Start: 2024-02-09 — End: 2024-02-09
  Filled 2024-02-09: qty 15

## 2024-02-09 MED ORDER — PHENYLEPHRINE 80 MCG/ML (10ML) SYRINGE FOR IV PUSH (FOR BLOOD PRESSURE SUPPORT)
PREFILLED_SYRINGE | INTRAVENOUS | Status: DC | PRN
  Administered 2024-02-09: 120 ug via INTRAVENOUS
  Administered 2024-02-09: 160 ug via INTRAVENOUS
  Administered 2024-02-09: 80 ug via INTRAVENOUS

## 2024-02-09 MED ORDER — ROCURONIUM BROMIDE 10 MG/ML (PF) SYRINGE
PREFILLED_SYRINGE | INTRAVENOUS | Status: DC | PRN
  Administered 2024-02-09: 60 mg via INTRAVENOUS
  Administered 2024-02-09: 10 mg via INTRAVENOUS

## 2024-02-09 MED ORDER — FENTANYL CITRATE (PF) 100 MCG/2ML IJ SOLN: 25.0000 ug | INTRAMUSCULAR | Status: DC | PRN

## 2024-02-09 MED ORDER — FENTANYL CITRATE (PF) 100 MCG/2ML IJ SOLN
INTRAMUSCULAR | Status: AC
Start: 2024-02-09 — End: ?
  Filled 2024-02-09: qty 2

## 2024-02-09 MED ORDER — CHLORHEXIDINE GLUCONATE 0.12 % MT SOLN
15.0000 mL | Freq: Once | OROMUCOSAL | Status: AC
  Administered 2024-02-09: 15 mL via OROMUCOSAL

## 2024-02-09 MED ORDER — LACTATED RINGERS IV SOLN: INTRAVENOUS | Status: DC

## 2024-02-09 NOTE — Anesthesia Postprocedure Evaluation (Signed)
 Anesthesia Post Note  Patient: Peter [name]  Procedure(s) Performed: VIDEO BRONCHOSCOPY WITH ENDOBRONCHIAL NAVIGATION (Right) BRONCHOSCOPY, WITH NEEDLE ASPIRATION BIOPSY BRONCHOSCOPY, WITH BRUSH BIOPSY BRONCHOSCOPY, WITH BIOPSY IRRIGATION, BRONCHUS     Patient location during evaluation: PACU Anesthesia Type: General Level of consciousness: awake and alert Pain management: pain level controlled Vital Signs Assessment: post-procedure vital signs reviewed and stable Respiratory status: spontaneous breathing, nonlabored ventilation, respiratory function stable and patient connected to nasal cannula oxygen Cardiovascular status: blood pressure returned to baseline and stable Postop Assessment: no apparent nausea or vomiting Anesthetic complications: no   No notable events documented.  Last Vitals:  Vitals:   02/09/24 0920 02/09/24 0927  BP: (!) 98/57 94/61  Pulse: 74 73  Resp: (!) 24 18  Temp:    SpO2: 90% 93%    Last Pain:  Vitals:   02/09/24 0900  TempSrc:   PainSc: 0-No pain                 Mariann Barter

## 2024-02-09 NOTE — Discharge Instructions (Signed)
 Flexible Bronchoscopy, Care After This sheet gives you information about how to care for yourself after your test. Your doctor may also give you more specific instructions. If you have problems or questions, contact your doctor. Follow these instructions at home: Eating and drinking When your numbness is gone and your cough and gag reflexes have come back, you may: Eat only soft foods. Slowly drink liquids. When you get home after the test, go back to your normal diet. Driving Do not drive for 24 hours if you were given a medicine to help you relax (sedative). Do not drive or use heavy machinery while taking prescription pain medicine. General instructions  Take over-the-counter and prescription medicines only as told by your doctor. Return to your normal activities as told. Ask what activities are safe for you. Do not use any products that have nicotine or tobacco in them. This includes cigarettes and e-cigarettes. If you need help quitting, ask your doctor. Keep all follow-up visits as told by your doctor. This is important. It is very important if you had a tissue sample (biopsy) taken. Get help right away if: You have shortness of breath that gets worse. You get light-headed. You feel like you are going to pass out (faint). You have chest pain. You cough up: More than a little blood. More blood than before. Summary Do not eat or drink anything (not even water) for 2 hours after your test, or until your numbing medicine wears off. Do not use cigarettes. Do not use e-cigarettes. Get help right away if you have chest pain.  Please call our office for any questions or concerns.  [phone]. Okay to restart your Xarelto on the evening of 02/10/2024  This information is not intended to replace advice given to you by your health care provider. Make sure you discuss any questions you have with your health care provider. Document Released: 08/25/2009 Document Revised: 10/10/2017 Document  Reviewed: 11/15/2016 Elsevier Patient Education  2020 [employer].

## 2024-02-09 NOTE — Op Note (Signed)
 Video Bronchoscopy with Robotic Assisted Bronchoscopic Navigation   Date of Operation: 02/09/2024   Pre-op Diagnosis: Right upper lobe and right lower lobe opacities  Post-op Diagnosis: Same  Surgeon: Levy Pupa  Assistants: None  Anesthesia: General endotracheal anesthesia  Operation: Flexible video fiberoptic bronchoscopy with robotic assistance and biopsies.  Estimated Blood Loss: Minimal  Complications: None  Indications and History: Peter [name] is a 78 y.o. male with history of right upper lobe and right lower lobe opacities noted on CT scan.  He was admitted for suspected pneumonia, treated for bacterial pneumonia.  The opacities persisted and he has a progressive dyspnea.  Recommendation made due to tissue diagnosis and to obtain culture data via robotic assisted navigational bronchoscopy. The risks, benefits, complications, treatment options and expected outcomes were discussed with the patient.  The possibilities of pneumothorax, pneumonia, reaction to medication, pulmonary aspiration, perforation of a viscus, bleeding, failure to diagnose a condition and creating a complication requiring transfusion or operation were discussed with the patient who freely signed the consent.    Description of Procedure: The patient was seen in the Preoperative Area, was examined and was deemed appropriate to proceed.  The patient was taken to [facility] endoscopy room 3, identified as Peter [name] and the procedure verified as Flexible Video Fiberoptic Bronchoscopy.  A Time Out was held and the above information confirmed.   Prior to the date of the procedure a high-resolution CT scan of the chest was performed. Utilizing ION software program a virtual tracheobronchial tree was generated to allow the creation of distinct navigation pathways to the patient's parenchymal abnormalities. After being taken to the operating room general anesthesia was initiated and the patient  was orally intubated.  The video fiberoptic bronchoscope was introduced via the endotracheal tube and a general inspection was performed which showed normal right and left lung anatomy with the exception of some narrowing of the right lower lobe superior segmental airway. Aspiration of the bilateral mainstems was completed to remove any remaining secretions. Robotic catheter inserted into patient's endotracheal tube.   Target #1 right lower lobe superior segment opacity: The distinct navigation pathways prepared prior to this procedure were then utilized to navigate to patient's lesion identified on CT scan. The robotic catheter was secured into place and the vision probe was withdrawn.  Lesion location was approximated using fluoroscopy.  Local registration and targeting was performed using Cios three-dimensional imaging. Under fluoroscopic guidance transbronchial needle brushings, transbronchial needle biopsies, and transbronchial forceps biopsies were performed to be sent for cytology and pathology.  A single needle pass was placed into sterile saline to be sent for culture data.    Target #2 right upper lobe opacity: The distinct navigation pathways prepared prior to this procedure were then utilized to navigate to patient's lesion identified on CT scan. The robotic catheter was secured into place and the vision probe was withdrawn.  Lesion location was approximated using fluoroscopy.  Local registration and targeting was performed using Cios three-dimensional imaging. Under fluoroscopic guidance transbronchial needle biopsies and transbronchial forceps biopsies were performed to be sent for cytology and pathology. A bronchioalveolar lavage was performed in the right upper lobe and sent for cytology.  At the end of the procedure a general airway inspection was performed and there was no evidence of active bleeding. The bronchoscope was removed.  The patient tolerated the procedure well. There was no significant blood loss and  there were no obvious complications. A post-procedural chest x-ray is pending.  Samples Target #  1: 1. Transbronchial needle brushings from right lower lobe opacity 2. Transbronchial Wang needle biopsies from right lower lobe opacity 3. Transbronchial forceps biopsies from right lower lobe opacity  Samples Target #2: 1. Transbronchial Wang needle biopsies from right upper lobe opacity 2. Transbronchial forceps biopsies from right upper lobe opacity 3. Bronchoalveolar lavage from right upper lobe  Plans:  The patient will be discharged from the PACU to home when recovered from anesthesia and after chest x-ray is reviewed. We will review the cytology, pathology and microbiology results with the patient when they become available. Outpatient followup will be with Saralyn Pilar,  NP and Dr. Delton Coombes.    Levy Pupa, MD, PhD 02/09/2024, 8:37 AM [facility] [phone] or if no answer before 7:00PM call [phone] For any issues after 7:00PM please call eLink [phone]

## 2024-02-09 NOTE — Interval H&P Note (Signed)
 History and Physical Interval Note:  02/09/2024 7:18 AM  Peter [name]  has presented today for surgery, with the diagnosis of RIGHT UPPER LOBE MASS AND RIGHT LOWER MASS.  The various methods of treatment have been discussed with the patient and family. After consideration of risks, benefits and other options for treatment, the patient has consented to  Procedure(s) with comments: VIDEO BRONCHOSCOPY WITH ENDOBRONCHIAL NAVIGATION (Right) - WITH FLOURO as a surgical intervention.  The patient's history has been reviewed, patient examined, no change in status, stable for surgery.  I have reviewed the patient's chart and labs.  Questions were answered to the patient's satisfaction.     Leslye Peer

## 2024-02-09 NOTE — Anesthesia Procedure Notes (Signed)
 Procedure Name: Intubation Date/Time: 02/09/2024 7:30 AM  Performed by: Loleta Ludivina Guymon, CRNAPre-anesthesia Checklist: Patient identified, Patient being monitored, Timeout performed, Emergency Drugs available and Suction available Patient Re-evaluated:Patient Re-evaluated prior to induction Oxygen Delivery Method: Circle system utilized Preoxygenation: Pre-oxygenation with 100% oxygen Induction Type: IV induction Ventilation: Mask ventilation without difficulty Laryngoscope Size: Mac and 4 Grade View: Grade I Tube type: Oral Tube size: 8.5 mm Number of attempts: 1 Airway Equipment and Method: Stylet Placement Confirmation: ETT inserted through vocal cords under direct vision, positive ETCO2 and breath sounds checked- equal and bilateral Secured at: 23 cm Tube secured with: Tape Dental Injury: Teeth and Oropharynx as per pre-operative assessment

## 2024-02-09 NOTE — Transfer of Care (Signed)
 Immediate Anesthesia Transfer of Care Note  Patient: Peter [name]  Procedure(s) Performed: VIDEO BRONCHOSCOPY WITH ENDOBRONCHIAL NAVIGATION (Right) BRONCHOSCOPY, WITH NEEDLE ASPIRATION BIOPSY BRONCHOSCOPY, WITH BRUSH BIOPSY BRONCHOSCOPY, WITH BIOPSY IRRIGATION, BRONCHUS  Patient Location: PACU  Anesthesia Type:General  Level of Consciousness: awake  Airway & Oxygen Therapy: Patient Spontanous Breathing and Patient connected to face mask oxygen  Post-op Assessment: Report given to RN and Post -op Vital signs reviewed and stable  Post vital signs: Reviewed and stable  Last Vitals:  Vitals Value Taken Time  BP 97/59 02/09/24 0850  Temp 36.3 C 02/09/24 0842  Pulse 75 02/09/24 0852  Resp 21 02/09/24 0852  SpO2 98 % 02/09/24 0852  Vitals shown include unfiled device data.  Last Pain:  Vitals:   02/09/24 0842  TempSrc: Temporal  PainSc: 0-No pain      Patients Stated Pain Goal: 0 (02/09/24 0604)  Complications: No notable events documented.

## 2024-02-10 ENCOUNTER — Encounter (HOSPITAL_COMMUNITY): Payer: Self-pay | Admitting: Emergency Medicine

## 2024-02-10 ENCOUNTER — Other Ambulatory Visit

## 2024-02-10 LAB — ACID FAST SMEAR (AFB, MYCOBACTERIA)
Acid Fast Smear: NEGATIVE
Acid Fast Smear: NEGATIVE

## 2024-02-11 LAB — CULTURE, BAL-QUANTITATIVE W GRAM STAIN: Gram Stain: NONE SEEN

## 2024-02-11 LAB — CYTOLOGY - NON PAP

## 2024-02-14 LAB — AEROBIC/ANAEROBIC CULTURE W GRAM STAIN (SURGICAL/DEEP WOUND)
Culture: NO GROWTH
Gram Stain: NONE SEEN
Gram Stain: NONE SEEN

## 2024-02-16 ENCOUNTER — Ambulatory Visit: Admitting: Acute Care

## 2024-02-16 ENCOUNTER — Encounter: Payer: Self-pay | Admitting: Acute Care

## 2024-02-16 VITALS — BP 109/73 | HR 75 | Ht 68.0 in | Wt 216.8 lb

## 2024-02-16 DIAGNOSIS — Z8679 Personal history of other diseases of the circulatory system: Secondary | ICD-10-CM

## 2024-02-16 DIAGNOSIS — C3490 Malignant neoplasm of unspecified part of unspecified bronchus or lung: Secondary | ICD-10-CM

## 2024-02-16 DIAGNOSIS — Z9889 Other specified postprocedural states: Secondary | ICD-10-CM

## 2024-02-16 DIAGNOSIS — R0609 Other forms of dyspnea: Secondary | ICD-10-CM | POA: Diagnosis not present

## 2024-02-16 DIAGNOSIS — Z8639 Personal history of other endocrine, nutritional and metabolic disease: Secondary | ICD-10-CM

## 2024-02-16 LAB — CYTOLOGY - NON PAP

## 2024-02-16 MED ORDER — BREZTRI AEROSPHERE 160-9-4.8 MCG/ACT IN AERO: 2.0000 | INHALATION_SPRAY | Freq: Two times a day (BID) | RESPIRATORY_TRACT | Status: DC

## 2024-02-16 NOTE — Progress Notes (Signed)
 History of Present Illness Peter [name] is a 78 y.o. male never smoker with an abnormal CT scan of the chest referred to Dr. Delton Coombes 01/2024 for consult regarding a lung nodule with  respiratory symptoms.   Synopsis CT scan from December 22, 2023, revealed dense areas of mass-like consolidation in the right upper lobe at the apex and in the superior segment of the right lower lobe with central cavitation and bronchiectatic change. There was also patchy left lower lobe ground glass consolidation. A repeat CT scan on January 22, 2024, showed persistent consolidation and cavitary process in the right upper lobe and superior segment of the right lower lobe with very little change in the interval.   He experienced improvement in breathing after hospitalization and used an albuterol inhaler four times a day until last week. Initially, he was coughing up 'little oysters' but has not expectorated anything for about three weeks. He still experiences dyspnea with minimal exertion and struggles to reach 1200 on the incentive spirometer, which is set at 1500.   He had COVID-19 in early 2021 and was admitted in early February 2025 with five days of dyspnea and wheezing, diagnosed with influenza A and associated hypoxemia. During hospitalization, a CT chest showed bilateral infiltrates and consolidated pneumonias, and he was treated for superimposed bacterial pneumonia. In early March, he was readmitted with hypertension and hyperglycemia after taking prednisone for gout. A repeat CT scan showed persistent consolidative changes.   He was diagnosed with hypertension and hyperglycemia during his second hospitalization, attributed to prednisone use for gout. His glucose was reported to be 500, but it decreased to 116 by discharge. He has a history of hypertension, atrial fibrillation, CAD with stent placement, CHF, hyperlipidemia, and sleep apnea managed with BiPAP.   He has never smoked and has no known exposure to  tuberculosis, black mold, or other inhaled toxins. 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   After seeing Dr. Delton Coombes 01/2024, he was scheduled for bronchoscopy with biopsies 02/09/2024. Xarelto was held x 48 hours prior to the procedure. He is here today to review the cytology results and ensure he has done well after the procedure.    02/16/2024 Patient presents for follow-up after bronchoscopy with biopsies on 02/09/2023.  Patient states that he has done well since the procedure.  He denies any fever, bleeding, discolored secretions, worsening shortness of breath than his baseline or adverse reaction to anesthesia.  Upon review of the results of the patient's cytology there were 2 specimens labeled specimen A.  1 was of the right upper lobe, 1 was of the right lower lobe. Specimen A of the right upper lobe was read as no malignant cells. 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 As I had never encountered 2 specimens with different tissue results labeled identically I had to call pathology to determine whether or not the patient actually had an adenocarcinoma. Both the patient and myself are frustrated that there was a period of time while we were waiting to get this sorted out.  I did receive a phone call from pathology.  They had corrected the specimen identification that had been labeled specimen a right upper lobe (no malignant cells), as specimen D. Specimen A ( Mucinous Adenocarcinoma) labeled Right lower lobe  was the true initial A specimen obtained during the bronchoscopy.  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  I discussed the options of treatment to include surgery and radiation therapy to the right lower lobe.  However I explained we would need to obtain a  PET scan to ensure there were no other sites to be concerned about as well as an MR brain to ensure no metastatic disease.  I also explained to the patient that we would need to do pulmonary function testing to see if he qualified as a surgical candidate.  At baseline the patient has dyspnea and he is unsure why.  He is a never smoker.  He did have COVID in the past.  I told him we will be ordering pulmonary function test for 2 reasons, to see if he is a surgical candidate but also to see if we can determine a cause for his dyspnea.  He is in agreement with this plan.  I made the referral to radiation oncology as I wanted to get the patient seen a soon as possible with his new diagnosis.  PET scan and MRI brain are pending, and are scheduled to be done February 23, 2024.  I am hopeful that these will have resulted once the patient sees radiation oncology.  I am unsure of the etiology of the patient's shortness of breath with exertion.  He is also very frustrated as he has not rebounded to his baseline prior to hospitalization. I am ordering an alpha-1 to ensure that this could not be non-smoking related COPD, and an alpha-1 deficiency. Hopefully pulmonary function testing will also be enlightening in regard to possible etiology of dyspnea.  I have Started the patient on Breztri as a therapeutic trial to see if he benefits from triple therapy.  I will see the patient in the office for follow-up to determine if he feels benefit.  If he does we will consider ordering Breztri as maintenance.   Patient has a history of A-fib but has not had issues since an ablation, so doubtful this is causing his dyspnea.  Echo done 01/01/2024 showed a pulmonary artery pressure of 42.  Patient is followed by cardiology.  Test Results: Cytology 02/09/2024 FINAL MICROSCOPIC DIAGNOSIS:  A. LUNG, RLL OPACITY, FINE NEEDLE ASPIRATION:  - Mucinous adenocarcinoma. See Comment.   B. LUNG, RLL OPACITY, BRUSHING:  - Mucinous  adenocarcinoma     D. LUNG, RUL OPACITY, FINE NEEDLE ASPIRATION:  - No malignant cells identified   CT chest: Dense areas of mass-like consolidation in the right upper lobe at the apex and in the superior segment of the right lower lobe with central cavitation and bronchiectatic change suggestive of necrotic pneumonia versus malignancy. Patchy left lower lobe ground-glass consolidation. (12/22/2023) CT chest: No evidence of pulmonary embolism. Mild mediastinal adenopathy in right paratracheal node measuring up to 1.2 cm. Persistent consolidation and cavitary process in the right upper lobe and superior segment of the right lower lobe with very little change in the interval. (01/22/2024)       Latest Ref Rng & Units 01/23/2024    2:20 AM 01/22/2024    6:49 PM 12/23/2023    8:08 AM  CBC  WBC 4.0 - 10.5 K/uL 13.2  13.0  5.4   Hemoglobin 13.0 - 17.0 g/dL 16.1  09.6  04.5   Hematocrit 39.0 - 52.0 % 32.8  34.2  35.2   Platelets 150 - 400 K/uL 163  170  149        Latest Ref Rng & Units 01/23/2024    2:20 AM 01/22/2024    6:49 PM 12/23/2023    8:08 AM  BMP  Glucose 70 - 99 mg/dL 161  096  96   BUN 8 - 23 mg/dL 24  25  29    Creatinine 0.61 - 1.24 mg/dL 0.45  4.09  8.11   Sodium 135 - 145 mmol/L 136  135  140   Potassium 3.5 - 5.1 mmol/L 4.0  4.4  4.0   Chloride 98 - 111 mmol/L 104  97  106   CO2 22 - 32 mmol/L 19  20  22    Calcium 8.9 - 10.3 mg/dL 8.9  9.2  8.9     BNP    Component Value Date/Time   BNP 416.8 (H) 01/23/2024 0220    ProBNP    Component Value Date/Time   PROBNP 144.7 (H) 02/03/2012 1854    PFT No results found for: "FEV1PRE", "FEV1POST", "FVCPRE", "FVCPOST", "TLC", "DLCOUNC", "PREFEV1FVCRT", "PSTFEV1FVCRT"  DG Chest Port 1 View Result Date: 02/09/2024 CLINICAL DATA:  Status post bronchoscopy.  Status post biopsy EXAM: PORTABLE CHEST 1 VIEW COMPARISON:  X-ray 01/09/2024.  CT 01/22/2024. FINDINGS: Underinflation with masslike opacity at the right lung apex.  Question cavitary component. Please correlate with prior CT workup. Tiny right pleural effusion or thickening. No pneumothorax. Enlarged cardiopericardial silhouette vascular congestion. Overlapping cardiac leads. Film is under penetrated. IMPRESSION: No pneumothorax post bronchoscopy. Electronically Signed   By: Karen Kays M.D.   On: 02/09/2024 10:05   DG C-ARM BRONCHOSCOPY Result Date: 02/09/2024 C-ARM BRONCHOSCOPY: Fluoroscopy was utilized by the requesting physician.  No radiographic interpretation.   CT Angio Chest PE W and/or Wo Contrast Result Date: 01/22/2024 CLINICAL DATA:  Shortness of breath EXAM: CT ANGIOGRAPHY CHEST WITH CONTRAST TECHNIQUE: Multidetector CT imaging of the chest was performed using the standard protocol during bolus administration of intravenous contrast. Multiplanar CT image reconstructions and MIPs were obtained to evaluate the vascular anatomy. RADIATION DOSE REDUCTION: This exam was performed according to the departmental dose-optimization program which includes automated exposure control, adjustment of the mA and/or kV according to patient size and/or use of iterative reconstruction technique. CONTRAST:  75mL OMNIPAQUE IOHEXOL 350 MG/ML SOLN COMPARISON:  CT 12/22/2023, chest x-ray 12/22/2023 FINDINGS: Cardiovascular: Satisfactory opacification of the pulmonary arteries to the segmental level. No evidence of pulmonary embolism. 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 No significant pulmonary vascular enhancement in the lung parenchymal abnormality at the right upper lobe. Mild aortic atherosclerosis. No aneurysm. Coronary vascular calcification. Cardiomegaly. No pericardial effusion Mediastinum/Nodes: Patent trachea. No thyroid mass. Mild mediastinal adenopathy as before. Right paratracheal nodes measuring up to 12 mm. Esophagus shows moderate hiatal hernia Lungs/Pleura: Subpleural reticulation within the bilateral lungs with areas  of mild ground-glass disease suspicious for chronic lung disease. Persistent consolidation and cavitary process in the right upper lobe. Persistent superior right lower lobe masslike consolidation with cavitation, measures about 4.7 by 3.6 cm, previously 4.6 x 3.9 cm in largely unchanged. Upper Abdomen: No acute finding Musculoskeletal: No acute osseous abnormality Review of the MIP images confirms the above findings. IMPRESSION: 1. No definite acute pulmonary embolus is seen. 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 2. Little change in masslike cavitary and consolidative abnormalities in the right upper lobe and superior right lower lobe.  Given lack of interval change, neoplasm is of concern and pulmonary consultation is recommended if not already performed 3. 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 4. Similar mild mediastinal adenopathy 5. Moderate hiatal hernia Aortic Atherosclerosis (ICD10-I70.0). Electronically Signed   By: Jasmine Pang M.D.   On: 01/22/2024 22:56     Past medical hx Past Medical History:  Diagnosis Date   Arthritis    At risk for sleep apnea    STOP-BANG= 5       SENT TO PCP 07-12-2015   CAD (coronary artery disease)    a. s/p PCI with DES x 3 to LAD in 2006 with bifurcation lesion and Plavix indefinitely was recommended.   Chronic combined systolic and diastolic CHF (congestive heart failure) (HCC)    a. Prior EF normal but EF dropped to 30-35% in 07/2016 in setting of AFIB.   CKD (chronic kidney disease), stage III (HCC)    Depression    Dyslipidemia    Dyspnea    tired quick d/t Afib   Dyspnea on exertion    Dysrhythmia    A.FIB   GERD (gastroesophageal reflux disease)    History of GI bleed    History of kidney stones    History of MI (myocardial infarction)     11-05-2005--  periprocedural MI  (cardiac cath) per dr Juanda Chance note   Hypertension    Hypertriglyceridemia    Iron deficiency anemia    Myocardial infarction [facility])    Persistent atrial fibrillation (HCC)    a. 07/2016, started on Eliquis -> readmitted later that month for AF RVR/acute HF - initially unsuccessful DCCV following TEE, so loaded with amio with repeat successful DCCV 07/2816.   Right ureteral stone    S/P drug eluting coronary stent placement    x3  to LAD  2006   Sleep apnea    wears BIPAP, 13 up to 23     Social History   Tobacco Use   Smoking status: Never    Passive exposure: Past   Smokeless tobacco: Never  Vaping Use   Vaping status: Never Used  Substance Use Topics   Alcohol use: No    Comment: 1 beer a year   Drug use: No    Mr.Dibert reports that he has never smoked. He has been exposed to tobacco smoke. He has never used smokeless tobacco. He reports that he does not drink alcohol and does not use drugs.  Tobacco Cessation: Never smoker   Past surgical hx, Family hx, Social hx all reviewed.  Current Outpatient Medications on File Prior to Visit  Medication Sig   albuterol (VENTOLIN HFA) 108 (90 Base) MCG/ACT inhaler Inhale 2 puffs into the lungs every 6 (six) hours as needed for wheezing or shortness of breath (intractable cough).   allopurinol (ZYLOPRIM) 100 MG tablet Take 100 mg by mouth at bedtime.   allopurinol (ZYLOPRIM) 300 MG tablet Take 300 mg by mouth in the morning.   atorvastatin (LIPITOR) 40 MG tablet TAKE 1 TABLET EVERY DAY   carvedilol (COREG) 25 MG tablet TAKE 1 TABLET TWICE DAILY WITH MEALS   Cyanocobalamin (VITAMIN B-12 PO) Take 1 tablet by mouth daily.   diphenhydramine-acetaminophen (TYLENOL PM) 25-500 MG TABS tablet Take 1 tablet by mouth at bedtime.   ferrous sulfate 325 (65 FE) MG tablet Take 1 tablet (325 mg total) by mouth every morning.   furosemide (LASIX) 40 MG tablet TAKE 1 TABLET EVERY DAY  lisinopril (ZESTRIL) 40 MG  tablet TAKE 1 TABLET EVERY DAY   nitroGLYCERIN (NITROSTAT) 0.4 MG SL tablet Place 1 tablet (0.4 mg total) under the tongue every 5 (five) minutes as needed for chest pain. X 3 doses   polyvinyl alcohol (LIQUIFILM TEARS) 1.4 % ophthalmic solution Place 1 drop into both eyes daily as needed for dry eyes.   rivaroxaban (XARELTO) 20 MG TABS tablet Take 1 tablet (20 mg total) by mouth at bedtime. Okay to restart this medication on 02/10/24   sertraline (ZOLOFT) 25 MG tablet Take 25 mg by mouth daily.   No current facility-administered medications on file prior to visit.     Allergies  Allergen Reactions   Prednisone Other (See Comments)    Made BS increase to 500- discovered on 01/22/24   Spironolactone Other (See Comments)    Tender breast    Review Of Systems:  Constitutional:   No  weight loss, night sweats,  Fevers, chills, +fatigue, or  lassitude.  HEENT:   No headaches,  Difficulty swallowing,  Tooth/dental problems, or  Sore throat,                No sneezing, itching, ear ache, nasal congestion, post nasal drip,   CV:  No chest pain,  Orthopnea, PND, swelling in lower extremities, anasarca, dizziness, palpitations, syncope.   GI  No heartburn, indigestion, abdominal pain, nausea, vomiting, diarrhea, change in bowel habits, loss of appetite, bloody stools.   Resp: + shortness of breath with exertion less at rest.  No excess mucus, no productive cough,  No non-productive cough,  No coughing up of blood.  No change in color of mucus.  No wheezing.  No chest wall deformity  Skin: no rash or lesions.  GU: no dysuria, change in color of urine, no urgency or frequency.  No flank pain, no hematuria   MS:  No joint pain or swelling.  No  decreased range of motion.  No back pain.  Psych:  No change in mood or affect. + depression and  anxiety.  No memory loss.   Vital Signs BP 109/73 (BP Location: Left Arm, Patient Position: Sitting, Cuff Size: Large)   Pulse 75   Ht 5\' 8"  (1.727 m)    Wt 216 lb 12.8 oz (98.3 kg)   SpO2 96%   BMI 32.96 kg/m    Physical Exam:  General- No distress,  A&Ox3, pleasant ENT: No sinus tenderness, TM clear, pale nasal mucosa, no oral exudate,no post nasal drip, no LAN Cardiac: S1, S2, regular rate and rhythm, no murmur Chest: No wheeze/ rales/ dullness; no accessory muscle use, no nasal flaring, no sternal retractions Abd.: Soft Non-tender, ND, BS +, Body mass index is 32.96 kg/m.  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  I have referred you to radiation oncology. You will get a call to get this appointment scheduled I have ordered a PET scan to complete staging. You will also get a call to get this scheduled. I have ordered an MRI Brain,also to complete  staging. You will get a call to get this scheduled. I have also ordered Pulmonary function tests.   You will get a call to get these scheduled.  This will help [place] to determine if you are a surgical candidate, and also help [place] to determine the cause of your shortness of breath. You will do a follow up video visit with me after to review results.  We will draw an Alpha 1 level to check for COPD as you are a never smoker. We will give you samples of Breztri Take 2 puffs in the morning and 2 puffs in the evening.  Rinse your mouth after use. Do not take the morning of PFT's Follow up in video visit after PFT and PET/ MR Brain to review results.  Please contact office for sooner follow  up if symptoms do not improve or worsen or seek emergency care    I spent 45 minutes dedicated to the care of this patient on the date of this encounter to include pre-visit review of records, face-to-face time with the patient discussing conditions above, post visit ordering of testing, clinical documentation with the electronic health record, making appropriate referrals as documented, and communicating necessary information to the patient's healthcare team.     Bevelyn Ngo, NP 02/16/2024  11:01 AM

## 2024-02-16 NOTE — Patient Instructions (Addendum)
 I am glad you have done well after the biopsy. 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  I have referred you to radiation oncology. You will get a call to get this appointment scheduled I have ordered a PET scan to complete staging. You will also get a call to get this scheduled. I have ordered an MRI Brain,also to complete staging. You will get a call to get this scheduled. I have also ordered Pulmonary function tests.   You will get a call to get these scheduled.  You will do a follow up video visit with me after to review results.  We will draw an Alpha 1 level to check for COPD as you are a never smoker. We will give you samples of Breztri Take 2 puffs in the morning and 2 puffs in the evening.  Rinse your mouth after use. Do not take the morning of PFT's Follow up in video visit after PFT and PET/ MR Brain to review results.  Please contact office for sooner follow up if symptoms do not improve or worsen or seek emergency care

## 2024-02-17 NOTE — Progress Notes (Signed)
 Location of tumor and Histology per Pathology Report: Right  Biopsy:      Past/Anticipated interventions by surgeon, if any: right       Past/Anticipated interventions by medical oncology, if any: Pending    Pain issues, if any:  No   SAFETY ISSUES: Prior radiation? No Pacemaker/ICD? No Possible current pregnancy? N/A Is the patient on methotrexate? No     BP 120/67   Pulse 86   Temp (!) 97.1 F (36.2 C)   Resp 18   Wt 217 lb 12.8 oz (98.8 kg)   SpO2 94%   BMI 33.12 kg/m

## 2024-02-18 NOTE — Progress Notes (Signed)
 Radiation Oncology         ([phone]) [phone] ________________________________  Initial {In/Out}patient Consultation  Name: Peter [name] MRN: [id number]  Date: 02/19/2024  DOB: 04-Oct-1946  BJ:[id number], [name], MD  Leslye Peer, MD   REFERRING PHYSICIAN: Leslye Peer, MD  DIAGNOSIS: There were no encounter diagnoses.  Malignant neoplasm of unspecified part of unspecified bronchus or lung (HCC)    HISTORY OF PRESENT ILLNESS::Peter [name] is a 78 y.o. male who is accompanied by ***. he is seen as a courtesy of Dr. Levy Pupa for an opinion concerning radiation therapy as part of management for his recently diagnosed lung cancer. Patient is without a smoking history and has no known exposure to tuberculosis, black mold, or other inhaled toxins.   The patient presented to the ED on 01/22/24 with complains of elevated blood pressure, more than 180 systolic. To further investigate his symptoms, he underwent a CTA-PE showing abrupt occlusion of right upper lobe pulmonary artery without significant enhancement in right upper lobe lung parenchymal abnormality; Persistent superior right lower lobe mass-like consolidation with cavitation, measures about 4.7 by 3.6 cm,previously 4.6 x 3.9 cm and evidence of mild chronic lung disease with subpleural fibrosis and areas of ground-glass disease.  In light of findings, he was referred to Dr. Delton Coombes on 01/30/24 where he reported experiences dyspnea with minimal exertion and struggles to reach 1200 on the incentive spirometer, which is set at 1500. To further evaluate his symptoms and persistent mass, he proceeded with a video bronchoscopy with endobronchial navigation on 02/09/24. 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   Most recent CT super chest done on 02/05/24 showed thick-walled cavitary masslike consolidation in the right upper lobe measures roughly 5.6 x 6.8 cm; Masslike consolidation in  the superior segment right lower lobe measures 4.2 x 4.3 cm. 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         Subsequently, he was referred to NP Kandice Robinsons on 02/16/24. Upon discussion, patient agreed to undergo PET scan and brain MRI along with pulmonary function test for further staging purposes. He also decided to proceed with radiation therapy in the meantime.       Of note: PET scan and brain MRI are scheduled for 02/23/24  PREVIOUS RADIATION THERAPY: No  PAST MEDICAL HISTORY:  Past Medical History:  Diagnosis Date   Arthritis    At risk for sleep apnea    STOP-BANG= 5       SENT TO PCP 07-12-2015   CAD (coronary artery disease)    a. s/p PCI with DES x 3 to LAD in 2006 with bifurcation lesion and Plavix indefinitely was recommended.   Chronic combined systolic and diastolic CHF (congestive heart failure) (HCC)    a. Prior EF normal but EF dropped to 30-35% in 07/2016 in setting of AFIB.   CKD (chronic kidney disease), stage III (HCC)    Depression    Dyslipidemia    Dyspnea    tired quick d/t Afib   Dyspnea on exertion    Dysrhythmia    A.FIB   GERD (gastroesophageal reflux disease)    History of GI bleed    History of kidney stones    History of MI (myocardial infarction)    11-05-2005--  periprocedural MI  (cardiac cath) per dr Juanda Chance note   Hypertension    Hypertriglyceridemia    Iron deficiency anemia    Myocardial infarction (  HCC)    Persistent atrial fibrillation (HCC)    a. 07/2016, started on Eliquis -> readmitted later that month for AF RVR/acute HF - initially unsuccessful DCCV following TEE, so loaded with amio with repeat successful DCCV 07/2816.   Right ureteral stone    S/P drug eluting coronary stent placement    x3  to LAD  2006   Sleep apnea    wears BIPAP, 14 up to 24    PAST SURGICAL HISTORY: Past Surgical History:  Procedure Laterality Date   BRONCHIAL BIOPSY   02/09/2024   Procedure: BRONCHOSCOPY, WITH BIOPSY;  Surgeon: Leslye Peer, MD;  Location: [facility] ENDOSCOPY;  Service: Pulmonary;;   BRONCHIAL BRUSHINGS  02/09/2024   Procedure: BRONCHOSCOPY, WITH BRUSH BIOPSY;  Surgeon: Leslye Peer, MD;  Location: [facility] ENDOSCOPY;  Service: Pulmonary;;   BRONCHIAL NEEDLE ASPIRATION BIOPSY  02/09/2024   Procedure: BRONCHOSCOPY, WITH NEEDLE ASPIRATION BIOPSY;  Surgeon: Leslye Peer, MD;  Location: [facility] ENDOSCOPY;  Service: Pulmonary;;   BRONCHIAL WASHINGS  02/09/2024   Procedure: IRRIGATION, BRONCHUS;  Surgeon: Leslye Peer, MD;  Location: [facility] ENDOSCOPY;  Service: Pulmonary;;   CARDIOVASCULAR STRESS TEST  01-06-2013  dr Myrtis Ser   normal perfusion study/  no ischemia or infarct/  normal LV function and wall motion , ef 57%   CARDIOVERSION N/A 08/05/2016   Procedure: CARDIOVERSION;  Surgeon: Thurmon Fair, MD;  Location: [facility] ENDOSCOPY;  Service: Cardiovascular;  Laterality: N/A;   CARDIOVERSION N/A 08/08/2016   Procedure: CARDIOVERSION;  Surgeon: Jake Bathe, MD;  Location[facility] ENDOSCOPY;  Service: Cardiovascular;  Laterality: N/A;   CORONARY ANGIOPLASTY  11-13-2005 dr Juanda Chance   Successful touch up post-dilatation of the 3 tandem overlying Cypher stents in proximal to mid LAD (based on IVUS 0% stenosis and patent diagonal branch)/  Cutting Balloon Angioplasty of Ramus branch   CORONARY ANGIOPLASTY WITH STENT PLACEMENT  11-05-2005   dr Smitty Cords brodie   PCI and DES x3 to birfurcation lesion LAD and diagonal branch of LAD/   20% dLM,  30-40% RCA, ef 60%   CYSTOSCOPY WITH RETROGRADE PYELOGRAM, URETEROSCOPY AND STENT PLACEMENT Right 07/14/2015   Procedure: CYSTOSCOPY WITH RETROGRADE PYELOGRAM, URETEROSCOPY AND STENT PLACEMENT;  Surgeon: Sebastian Ache, MD;  Location: [facility];  Service: Urology;  Laterality: Right;   ELECTROPHYSIOLOGIC STUDY N/A 10/24/2016   Procedure: Atrial Fibrillation Ablation;  Surgeon: Will Jorja Loa, MD;  Location: [facility] INVASIVE CV  LAB;  Service: Cardiovascular;  Laterality: N/A;   EXTRACORPOREAL SHOCK WAVE LITHOTRIPSY  yrs ago   HEMORRHOID SURGERY N/A 10/24/2017   Procedure: HEMORRHOIDECTOMY;  Surgeon: Andria Meuse, MD;  Location: [facility] OR;  Service: General;  Laterality: N/A;   HERNIA REPAIR Bilateral 2000   Inguinal Hernia   IR GENERIC HISTORICAL  10/23/2016   IR FLUORO GUIDE CV LINE RIGHT 10/23/2016 Berdine Dance, MD [facility]   IR GENERIC HISTORICAL  10/23/2016   IR US GUIDE VASC ACCESS RIGHT 10/23/2016 Berdine Dance, MD [facility]   LEFT URETEROSCOPIC STONE EXTRACTION  04/10/2006   STONE EXTRACTION WITH BASKET Right 07/14/2015   Procedure: STONE EXTRACTION WITH BASKET;  Surgeon: Sebastian Ache, MD;  Location: [facility];  Service: Urology;  Laterality: Right;   TEE WITHOUT CARDIOVERSION N/A 08/05/2016   Procedure: TRANSESOPHAGEAL ECHOCARDIOGRAM (TEE);  Surgeon: Thurmon Fair, MD;  Location: [facility] ENDOSCOPY;  Service: Cardiovascular;  Laterality: N/A;   TOTAL HIP ARTHROPLASTY Right 07/16/2022   Procedure: RIGHT TOTAL HIP ARTHROPLASTY ANTERIOR APPROACH;  Surgeon: Tarry Kos,  MD;  Location: [facility] OR;  Service: Orthopedics;  Laterality: Right;  3-C   TRANSTHORACIC ECHOCARDIOGRAM  02/04/2012   grade I diastolic dysfunction/  ef 55-60%/  trivial MR and TR/  mild LAE   VIDEO BRONCHOSCOPY WITH ENDOBRONCHIAL NAVIGATION Right 02/09/2024   Procedure: VIDEO BRONCHOSCOPY WITH ENDOBRONCHIAL NAVIGATION;  Surgeon: Leslye Peer, MD;  Location: [facility] ENDOSCOPY;  Service: Pulmonary;  Laterality: Right;  WITH FLOURO    FAMILY HISTORY:  Family History  Problem Relation Age of Onset   Heart attack Father 42   Congestive Heart Failure Father 25   Cancer Other     SOCIAL HISTORY:  Social History   Tobacco Use   Smoking status: Never    Passive exposure: Past   Smokeless tobacco: Never  Vaping Use   Vaping status: Never Used  Substance Use Topics   Alcohol use: No    Comment: 1 beer a year   Drug  use: No    ALLERGIES:  Allergies  Allergen Reactions   Prednisone Other (See Comments)    Made BS increase to 500- discovered on 01/22/24   Spironolactone Other (See Comments)    Tender breast    MEDICATIONS:  Current Outpatient Medications  Medication Sig Dispense Refill   albuterol (VENTOLIN HFA) 108 (90 Base) MCG/ACT inhaler Inhale 2 puffs into the lungs every 6 (six) hours as needed for wheezing or shortness of breath (intractable cough). 8 g 2   allopurinol (ZYLOPRIM) 100 MG tablet Take 100 mg by mouth at bedtime.     allopurinol (ZYLOPRIM) 300 MG tablet Take 300 mg by mouth in the morning.     atorvastatin (LIPITOR) 40 MG tablet TAKE 1 TABLET EVERY DAY 90 tablet 1   budeson-glycopyrrolate-formoterol (BREZTRI AEROSPHERE) 160-9-4.8 MCG/ACT AERO Inhale 2 puffs into the lungs in the morning and at bedtime.     carvedilol (COREG) 25 MG tablet TAKE 1 TABLET TWICE DAILY WITH MEALS 180 tablet 3   Cyanocobalamin (VITAMIN B-12 PO) Take 1 tablet by mouth daily.     diphenhydramine-acetaminophen (TYLENOL PM) 25-500 MG TABS tablet Take 1 tablet by mouth at bedtime.     ferrous sulfate 325 (65 FE) MG tablet Take 1 tablet (325 mg total) by mouth every morning. 90 tablet 2   furosemide (LASIX) 40 MG tablet TAKE 1 TABLET EVERY DAY 90 tablet 3   lisinopril (ZESTRIL) 40 MG tablet TAKE 1 TABLET EVERY DAY 90 tablet 3   nitroGLYCERIN (NITROSTAT) 0.4 MG SL tablet Place 1 tablet (0.4 mg total) under the tongue every 5 (five) minutes as needed for chest pain. X 3 doses 25 tablet 5   polyvinyl alcohol (LIQUIFILM TEARS) 1.4 % ophthalmic solution Place 1 drop into both eyes daily as needed for dry eyes.     rivaroxaban (XARELTO) 20 MG TABS tablet Take 1 tablet (20 mg total) by mouth at bedtime. Okay to restart this medication on 02/10/24     sertraline (ZOLOFT) 25 MG tablet Take 25 mg by mouth daily.     No current facility-administered medications for this encounter.    REVIEW OF SYSTEMS:  A 10+ POINT  REVIEW OF SYSTEMS WAS OBTAINED including neurology, dermatology, psychiatry, cardiac, respiratory, lymph, extremities, GI, GU, musculoskeletal, constitutional, reproductive, HEENT. ***   PHYSICAL EXAM:  vitals were not taken for this visit.   General: Alert and oriented, in no acute distress HEENT: Head is normocephalic. Extraocular movements are intact. Oropharynx is clear. Neck: Neck is supple, no palpable cervical or supraclavicular lymphadenopathy. Heart:  Regular in rate and rhythm with no murmurs, rubs, or gallops. Chest: Clear to auscultation bilaterally, with no rhonchi, wheezes, or rales. Abdomen: Soft, nontender, nondistended, with no rigidity or guarding. Extremities: No cyanosis or edema. Lymphatics: see Neck Exam Skin: No concerning lesions. Musculoskeletal: symmetric strength and muscle tone throughout. Neurologic: Cranial nerves II through XII are grossly intact. No obvious focalities. Speech is fluent. Coordination is intact. Psychiatric: Judgment and insight are intact. Affect is appropriate. ***  ECOG = ***  0 - Asymptomatic (Fully active, able to carry on all predisease activities without restriction)  1 - Symptomatic but completely ambulatory (Restricted in physically strenuous activity but ambulatory and able to carry out work of a light or sedentary nature. For example, light housework, office work)  2 - Symptomatic, <50% in bed during the day (Ambulatory and capable of all self care but unable to carry out any work activities. Up and about more than 50% of waking hours)  3 - Symptomatic, >50% in bed, but not bedbound (Capable of only limited self-care, confined to bed or chair 50% or more of waking hours)  4 - Bedbound (Completely disabled. Cannot carry on any self-care. Totally confined to bed or chair)  5 - Death   Santiago Glad MM, Creech RH, Tormey DC, et al. [phone]). "Toxicity and response criteria of the [facility]". Am. Evlyn Clines. Oncol. 5 (6):  649-55  LABORATORY DATA:  Lab Results  Component Value Date   WBC 13.2 (H) 01/23/2024   HGB 11.0 (L) 01/23/2024   HCT 32.8 (L) 01/23/2024   MCV 100.0 01/23/2024   PLT 163 01/23/2024   NEUTROABS 11.7 (H) 01/22/2024   Lab Results  Component Value Date   NA 136 01/23/2024   K 4.0 01/23/2024   CL 104 01/23/2024   CO2 19 (L) 01/23/2024   GLUCOSE 241 (H) 01/23/2024   BUN 24 (H) 01/23/2024   CREATININE 0.94 01/23/2024   CALCIUM 8.9 01/23/2024      RADIOGRAPHY: CT Super D Chest Wo Contrast Result Date: 02/16/2024 CLINICAL DATA:  Masslike cavitary and consolidative lesions in the lungs on previous CT chest. EXAM: CT CHEST WITHOUT CONTRAST TECHNIQUE: Multidetector CT imaging of the chest was performed using thin slice collimation for electromagnetic bronchoscopy planning purposes, without intravenous contrast. RADIATION DOSE REDUCTION: This exam was performed according to the departmental dose-optimization program which includes automated exposure control, adjustment of the mA and/or kV according to patient size and/or use of iterative reconstruction technique. COMPARISON:  01/22/2024, 12/22/2023. FINDINGS: Cardiovascular: Atherosclerotic calcification of the aorta, aortic valve and coronary arteries. Enlarged pulmonic trunk and heart. No pericardial effusion. Mediastinum/Nodes: Mediastinal lymph nodes measure up to 11 mm in the low right paratracheal station, as before. Clustered small subcarinal lymph nodes. Hilar regions are difficult to definitively evaluate without IV contrast. No axillary adenopathy. Esophagus is grossly unremarkable. Lungs/Pleura: Thick-walled cavitary masslike consolidation in the right upper lobe measures roughly 5.6 x 6.8 cm (7/43), stable in the short interval. Masslike consolidation in the superior segment right lower lobe measures 4.2 x 4.3 cm (7/63), also stable in the short interval from 01/22/2024. Subpleural reticulation, coarsened ground-glass and traction  bronchiolectasis with possibly a minimal craniocaudal gradient. No pleural fluid. Airway is unremarkable. Upper Abdomen: Gallstone. Small low-attenuation lesion in the left kidney, too small to characterize. No specific follow-up necessary. Moderate hiatal hernia. Visualized portions of the liver, gallbladder, adrenal glands, kidneys, spleen, pancreas, stomach and bowel are otherwise grossly unremarkable. No upper abdominal adenopathy. Musculoskeletal: Degenerative changes in  the spine. IMPRESSION: 1. Biopsy-proven right upper and right lower lobe adenocarcinoma with suspected satellite nodules in the right middle and right lower lobes. Borderline enlarged ipsilateral mediastinal lymph nodes, likely metastatic. 2. 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 3. Cholelithiasis. 4. Moderate hiatal hernia. 5. Aortic atherosclerosis (ICD10-I70.0). Coronary artery calcification. 6. Enlarged pulmonic trunk, indicative of pulmonary arterial hypertension. Electronically Signed   By: Leanna Battles M.D.   On: 02/16/2024 14:02   DG Chest Port 1 View Result Date: 02/09/2024 CLINICAL DATA:  Status post bronchoscopy.  Status post biopsy EXAM: PORTABLE CHEST 1 VIEW COMPARISON:  X-ray 01/09/2024.  CT 01/22/2024. FINDINGS: Underinflation with masslike opacity at the right lung apex. Question cavitary component. Please correlate with prior CT workup. Tiny right pleural effusion or thickening. No pneumothorax. Enlarged cardiopericardial silhouette vascular congestion. Overlapping cardiac leads. Film is under penetrated. IMPRESSION: No pneumothorax post bronchoscopy. Electronically Signed   By: Karen Kays M.D.   On: 02/09/2024 10:05   DG C-ARM BRONCHOSCOPY Result Date: 02/09/2024 C-ARM BRONCHOSCOPY: Fluoroscopy was utilized by the requesting physician.  No radiographic interpretation.   CT Angio Chest PE W and/or Wo Contrast Result Date: 01/22/2024 CLINICAL DATA:   Shortness of breath EXAM: CT ANGIOGRAPHY CHEST WITH CONTRAST TECHNIQUE: Multidetector CT imaging of the chest was performed using the standard protocol during bolus administration of intravenous contrast. Multiplanar CT image reconstructions and MIPs were obtained to evaluate the vascular anatomy. RADIATION DOSE REDUCTION: This exam was performed according to the departmental dose-optimization program which includes automated exposure control, adjustment of the mA and/or kV according to patient size and/or use of iterative reconstruction technique. CONTRAST:  75mL OMNIPAQUE IOHEXOL 350 MG/ML SOLN COMPARISON:  CT 12/22/2023, chest x-ray 12/22/2023 FINDINGS: Cardiovascular: Satisfactory opacification of the pulmonary arteries to the segmental level. No evidence of pulmonary embolism. 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 No significant pulmonary vascular enhancement in the lung parenchymal abnormality at the right upper lobe. Mild aortic atherosclerosis. No aneurysm. Coronary vascular calcification. Cardiomegaly. No pericardial effusion Mediastinum/Nodes: Patent trachea. No thyroid mass. Mild mediastinal adenopathy as before. Right paratracheal nodes measuring up to 12 mm. Esophagus shows moderate hiatal hernia Lungs/Pleura: Subpleural reticulation within the bilateral lungs with areas of mild ground-glass disease suspicious for chronic lung disease. Persistent consolidation and cavitary process in the right upper lobe. Persistent superior right lower lobe masslike consolidation with cavitation, measures about 4.7 by 3.6 cm, previously 4.6 x 3.9 cm in largely unchanged. Upper Abdomen: No acute finding Musculoskeletal: No acute osseous abnormality Review of the MIP images confirms the above findings. IMPRESSION: 1. No definite acute pulmonary embolus is seen. 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 2. Little change in masslike cavitary and consolidative abnormalities in the right upper lobe and superior right lower lobe. Given lack of interval change, neoplasm is of concern and pulmonary consultation is recommended if not already performed 3. 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 4. Similar mild mediastinal adenopathy 5. Moderate hiatal hernia Aortic Atherosclerosis (ICD10-I70.0). Electronically Signed   By: Jasmine Pang M.D.   On: 01/22/2024 22:56      IMPRESSION: Malignant neoplasm of unspecified part of unspecified bronchus or lung (HCC)    ***  Today, I talked to the patient and family about the findings and work-up thus far.  We discussed  the natural history of *** and general treatment, highlighting the role of radiotherapy in the management.  We discussed the available radiation techniques, and focused on the details of logistics and delivery.  We reviewed the anticipated acute and late sequelae associated with radiation in this setting.  The patient was encouraged to ask questions that I answered to the best of my ability. *** A patient consent form was discussed and signed.  We retained a copy for our records.  The patient would like to proceed with radiation and will be scheduled for CT simulation.  PLAN: ***    *** minutes of total time was spent for this patient encounter, including preparation, face-to-face counseling with the patient and coordination of care, physical exam, and documentation of the encounter.   ------------------------------------------------  Billie Lade, PhD, MD  This document serves as a record of services personally performed by Antony Blackbird, MD. It was created on his behalf by Herbie Saxon, a trained medical scribe. The creation of this record is based on the scribe's personal observations and the provider's  statements to them. This document has been checked and approved by the attending provider.

## 2024-02-19 ENCOUNTER — Encounter: Payer: Self-pay | Admitting: Radiation Oncology

## 2024-02-19 ENCOUNTER — Ambulatory Visit
Admission: RE | Admit: 2024-02-19 | Discharge: 2024-02-19 | Disposition: A | Discharge status: Home / Self Care | Source: Ambulatory Visit | Attending: Radiation Oncology | Admitting: Radiation Oncology

## 2024-02-19 VITALS — BP 120/67 | HR 86 | Temp 97.1°F | Resp 18 | Ht 68.0 in | Wt 217.5 lb

## 2024-02-19 VITALS — BP 120/67 | HR 86 | Temp 97.1°F | Resp 18 | Wt 217.8 lb

## 2024-02-19 DIAGNOSIS — E781 Pure hyperglyceridemia: Secondary | ICD-10-CM | POA: Insufficient documentation

## 2024-02-19 DIAGNOSIS — I700 Atherosclerosis of aorta: Secondary | ICD-10-CM | POA: Diagnosis not present

## 2024-02-19 DIAGNOSIS — Z809 Family history of malignant neoplasm, unspecified: Secondary | ICD-10-CM | POA: Insufficient documentation

## 2024-02-19 DIAGNOSIS — R590 Localized enlarged lymph nodes: Secondary | ICD-10-CM | POA: Diagnosis not present

## 2024-02-19 DIAGNOSIS — J479 Bronchiectasis, uncomplicated: Secondary | ICD-10-CM | POA: Insufficient documentation

## 2024-02-19 DIAGNOSIS — Z79899 Other long term (current) drug therapy: Secondary | ICD-10-CM | POA: Insufficient documentation

## 2024-02-19 DIAGNOSIS — I4891 Unspecified atrial fibrillation: Secondary | ICD-10-CM | POA: Diagnosis not present

## 2024-02-19 DIAGNOSIS — I130 Hypertensive heart and chronic kidney disease with heart failure and stage 1 through stage 4 chronic kidney disease, or unspecified chronic kidney disease: Secondary | ICD-10-CM | POA: Insufficient documentation

## 2024-02-19 DIAGNOSIS — I252 Old myocardial infarction: Secondary | ICD-10-CM | POA: Diagnosis not present

## 2024-02-19 DIAGNOSIS — C3431 Malignant neoplasm of lower lobe, right bronchus or lung: Secondary | ICD-10-CM | POA: Insufficient documentation

## 2024-02-19 DIAGNOSIS — E785 Hyperlipidemia, unspecified: Secondary | ICD-10-CM | POA: Insufficient documentation

## 2024-02-19 DIAGNOSIS — K449 Diaphragmatic hernia without obstruction or gangrene: Secondary | ICD-10-CM | POA: Diagnosis not present

## 2024-02-19 DIAGNOSIS — K219 Gastro-esophageal reflux disease without esophagitis: Secondary | ICD-10-CM | POA: Diagnosis not present

## 2024-02-19 DIAGNOSIS — G4730 Sleep apnea, unspecified: Secondary | ICD-10-CM | POA: Diagnosis not present

## 2024-02-19 DIAGNOSIS — I5042 Chronic combined systolic (congestive) and diastolic (congestive) heart failure: Secondary | ICD-10-CM | POA: Insufficient documentation

## 2024-02-19 DIAGNOSIS — K8020 Calculus of gallbladder without cholecystitis without obstruction: Secondary | ICD-10-CM | POA: Insufficient documentation

## 2024-02-20 ENCOUNTER — Other Ambulatory Visit: Payer: Self-pay

## 2024-02-20 LAB — ALPHA-1 ANTITRYPSIN PHENOTYPE: A-1 Antitrypsin, Ser: 159 mg/dL (ref 83–199)

## 2024-02-20 NOTE — Progress Notes (Signed)
 The proposed treatment discussed in conference is for discussion purpose only and is not a binding recommendation.  The patients have not been physically examined, or presented with their treatment options.  Therefore, final treatment plans cannot be decided.

## 2024-02-23 ENCOUNTER — Ambulatory Visit (HOSPITAL_COMMUNITY)
Admission: RE | Admit: 2024-02-23 | Discharge: 2024-02-23 | Disposition: A | Discharge status: Home / Self Care | Source: Ambulatory Visit | Attending: Acute Care | Admitting: Acute Care

## 2024-02-23 DIAGNOSIS — C3490 Malignant neoplasm of unspecified part of unspecified bronchus or lung: Secondary | ICD-10-CM | POA: Insufficient documentation

## 2024-02-23 DIAGNOSIS — G319 Degenerative disease of nervous system, unspecified: Secondary | ICD-10-CM | POA: Diagnosis not present

## 2024-02-23 DIAGNOSIS — R918 Other nonspecific abnormal finding of lung field: Secondary | ICD-10-CM | POA: Diagnosis not present

## 2024-02-23 LAB — GLUCOSE, CAPILLARY: Glucose-Capillary: 153 mg/dL — ABNORMAL HIGH (ref 70–99)

## 2024-02-23 MED ORDER — GADOBUTROL 1 MMOL/ML IV SOLN
10.0000 mL | Freq: Once | INTRAVENOUS | Status: AC | PRN
Start: 2024-02-23 — End: 2024-02-23
  Administered 2024-02-23: 10 mL via INTRAVENOUS

## 2024-02-23 MED ORDER — FLUDEOXYGLUCOSE F - 18 (FDG) INJECTION
10.8000 | Freq: Once | INTRAVENOUS | Status: AC
  Administered 2024-02-23: 10.8 via INTRAVENOUS

## 2024-02-24 LAB — FUNGAL STAIN REFLEX

## 2024-02-24 LAB — FUNGUS STAIN

## 2024-02-26 ENCOUNTER — Institutional Professional Consult (permissible substitution): Payer: Medicare HMO | Admitting: Internal Medicine

## 2024-02-27 ENCOUNTER — Encounter (HOSPITAL_COMMUNITY): Payer: Self-pay

## 2024-03-02 ENCOUNTER — Encounter (HOSPITAL_COMMUNITY): Payer: Self-pay

## 2024-03-02 DIAGNOSIS — C3491 Malignant neoplasm of unspecified part of right bronchus or lung: Secondary | ICD-10-CM | POA: Diagnosis not present

## 2024-03-05 ENCOUNTER — Ambulatory Visit (HOSPITAL_BASED_OUTPATIENT_CLINIC_OR_DEPARTMENT_OTHER): Admitting: Emergency Medicine

## 2024-03-05 ENCOUNTER — Encounter (HOSPITAL_BASED_OUTPATIENT_CLINIC_OR_DEPARTMENT_OTHER)

## 2024-03-05 DIAGNOSIS — C3490 Malignant neoplasm of unspecified part of unspecified bronchus or lung: Secondary | ICD-10-CM

## 2024-03-05 LAB — PULMONARY FUNCTION TEST
DL/VA % pred: 129 %
DL/VA: 5.15 ml/min/mmHg/L
DLCO cor % pred: 61 %
DLCO cor: 13.4 ml/min/mmHg
DLCO unc % pred: 54 %
DLCO unc: 11.81 ml/min/mmHg
FEF 25-75 Post: 2.51 L/s
FEF 25-75 Pre: 2.05 L/s
FEF2575-%Change-Post: 22 %
FEF2575-%Pred-Post: 146 %
FEF2575-%Pred-Pre: 119 %
FEV1-%Change-Post: 9 %
FEV1-%Pred-Post: 50 %
FEV1-%Pred-Pre: 46 %
FEV1-Post: 1.24 L
FEV1-Pre: 1.13 L
FEV1FVC-%Change-Post: -3 %
FEV1FVC-%Pred-Pre: 127 %
FEV6-%Change-Post: 13 %
FEV6-%Pred-Post: 43 %
FEV6-%Pred-Pre: 38 %
FEV6-Post: 1.4 L
FEV6-Pre: 1.24 L
FEV6FVC-%Pred-Post: 107 %
FEV6FVC-%Pred-Pre: 107 %
FVC-%Change-Post: 13 %
FVC-%Pred-Post: 40 %
FVC-%Pred-Pre: 35 %
FVC-Post: 1.4 L
FVC-Pre: 1.24 L
Post FEV1/FVC ratio: 89 %
Post FEV6/FVC ratio: 100 %
Pre FEV1/FVC ratio: 92 %
Pre FEV6/FVC Ratio: 100 %
RV % pred: 43 %
RV: 1.05 L
TLC % pred: 46 %
TLC: 2.92 L

## 2024-03-05 NOTE — Patient Instructions (Signed)
 Full PFT Performed Today

## 2024-03-05 NOTE — Progress Notes (Signed)
 Full PFT Performed Today

## 2024-03-08 ENCOUNTER — Telehealth: Admitting: Acute Care

## 2024-03-08 ENCOUNTER — Encounter: Payer: Self-pay | Admitting: Acute Care

## 2024-03-08 DIAGNOSIS — C3491 Malignant neoplasm of unspecified part of right bronchus or lung: Secondary | ICD-10-CM | POA: Diagnosis not present

## 2024-03-08 DIAGNOSIS — R052 Subacute cough: Secondary | ICD-10-CM | POA: Diagnosis not present

## 2024-03-08 DIAGNOSIS — J069 Acute upper respiratory infection, unspecified: Secondary | ICD-10-CM

## 2024-03-08 DIAGNOSIS — J849 Interstitial pulmonary disease, unspecified: Secondary | ICD-10-CM

## 2024-03-08 MED ORDER — BENZONATATE 100 MG PO CAPS: 200.0000 mg | ORAL_CAPSULE | Freq: Two times a day (BID) | ORAL | 1 refills | Status: DC | PRN

## 2024-03-08 MED ORDER — AMOXICILLIN-POT CLAVULANATE 875-125 MG PO TABS
1.0000 | ORAL_TABLET | Freq: Two times a day (BID) | ORAL | 0 refills | Status: AC
Start: 2024-03-08 — End: 2024-03-15

## 2024-03-08 NOTE — Progress Notes (Signed)
 Virtual Visit via Video Note  I connected with Peter [name] on 03/08/24 at 11:00 AM EDT by a video enabled telemedicine application and verified that I am speaking with the correct person using two identifiers.  Location: Patient:  At home Provider:  [address] [place], [place], [address]    I discussed the limitations of evaluation and management by telemedicine and the availability of in person appointments. The patient expressed understanding and agreed to proceed.  Synopsis  78 y.o. male never smoker with an abnormal CT scan of the chest referred to Dr. Baldwin [name] 01/2024 for consult regarding a lung nodule with  respiratory symptoms. He developed respiratory symptoms after hospitalization with severe Influenza A with pneumonia 12/19/2023. 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 He had bronchoscopy with biopsies 01/2024. 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 PET scan and PFT's were ordered to complete staging, and to better evaluate his ILD as a potential cause of dyspnea on exertion. He is presenting by video today to review the results of the PET scan as well as review PFT results.  History of Present Illness: Pt. Presents for follow up. He states he has subsequently developed a productive cough with some sputum production which is new. The sputum has some blood tinge to it at times. He states he feels his throat is raw. I will treat with antibiotics, and have him take probiotics with this to maintain normal gut flora. I will also order something for cough suppression. He understands to seek emergency care if hemoptysis worsens, becomes more bright red, and does not stop.  We have reviewed his PET scan results. This showed the Right lower lobe mass, right upper lobe mass , and right middle lobe nodularities. There were also several  right sided hypermetabolic lymph nodes that were hypermetabolic, and worrisome for metastatic disease. Pt. Has already been seen by Rad Onc, as we felt treatment would most likely involve a combination of radiation and chemo. I will refer to medical oncology for systemic treatment.  We have reviewed PFT's which show a moderately reduced DLCO with reduced FVC and FEV1. F/F ratio was 126 %. Alpha 1 testing showed adequate Alpha 1 production, PHENOTYPE IS PI*MM. The decreased DLCO can be a results of his ILD, but I believe he also has some physical deconditioning that is contributing to his dyspnea. He has been unable to do much physically since his pneumonia in 12/2023. He did not feel he got much benefit from the Breztri . He completed the samples. He is using his albuterol  as needed.He will need to follow up with Dr. Bertrum [name] or [facility] and will need PFT's again after treatment to monitor for progression of ILD. He will also need a 6 minute walk at that time. We need to get him to medical oncology for treatment of his lung cancer, and then we can focus on his ILD.   Echo done 12/2023 shows an EF of  55-60%, Left Ventricular diastolic parameters consistent with Grade II diastolic dysfunction, Mildly elevated PASP of 41.7 mm Hg. Trivial mitral valve regurgitation, no stenosis  He will need to follow up with cardiology on a regular basis. I do not see follow up through [facility] documented.  PET scan and PFT's personally reviewed by me.     Observations/Objective: PET Scan 02/23/2024  IMPRESSION: Consolidative  masslike opacity identified in the right lower lobe has abnormal radiotracer uptake and worrisome for potential neoplasm.   The similar area in the right upper lobe with central cavitary or bleb and has also abnormal uptake but not as intense as the right lower lobe focus.   The small areas of nodularity in the middle lobe also show concerning level of uptake.   Several hypermetabolic right hilar  nodes are identified worrisome for areas of disease. There is some additional nodes in the mediastinum which are pathologically enlarged but do not have significant uptake above blood pool. Attention on follow-up.   No abnormal radiotracer uptake seen beneath the diaphragm or in the neck.   Large hiatal hernia.  Gallstone.  Chronic lung changes.     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  They will take great care of you.  Call if you have any questions.  New productive Cough with some blood tinged secretions Plan Sips of water  instead of throat clearing Sugar Free Jolly Ranchers or Werther's originals for throat soothing. Delsym  Cough syrup 5 cc's every 12 hours Non-sedating antihistamine of your choice daily ( Zyrtec, Allegra, Xyzol, Claritin ( Generic ok)  If you develop any significant bleeding that does not stop , seek emergency care.  Continue using albuterol  as needed for shortness of breath or wheezing.  I have ordered Tessalon Perles to see if these help with cough. Take up to twice daily as needed for cough. I have ordered Augmentin for the discolored secretions. Take 1 tablet in the morning , and one in the evening x 7 days. Please us  probiotic while on antibiotic. I like Culturelle, which is over the counter, once daily or Activia Yogurt  Dyspnea on exertion in setting of ILD Alpha 1 WNL Mildly elevated PAP PFT's with moderately decreased DLCO Plan Follow up in 1 month after treatment with antibiotics Follow up with Dr. Waylan [name] Dr. Bertrum [name] for continued monitoring of ILD with PFT's / needs a 6 minute walk.    Follow Up Instructions: Follow up in 1 month to ensure you are better . Follow up with Dr. Waylan [name] / Peter [name] to monitor for ILD progression after  treatment for adenocarcinoma of the lung. Follow up with cardiology to monitor for PAH.   I discussed the assessment and treatment plan with the patient. The patient was provided an opportunity to ask questions and all were answered. The patient agreed with the plan and demonstrated an understanding of the instructions.   The patient was advised to call back or seek an in-person evaluation if the symptoms worsen or if the condition fails to improve as anticipated.  I spent 35  minutes dedicated to the care of this patient on the date of this encounter to include pre-visit review of records,video  face-to-face time with the patient discussing conditions above, post visit ordering of testing, clinical documentation with the electronic health record, making appropriate referrals as documented, and communicating necessary information to the patient's healthcare team.   Raejean Bullock, NP

## 2024-03-08 NOTE — Patient Instructions (Addendum)
 It is good to see you today. I am sorry you have been coughing.  Sips of water  instead of throat clearing Sugar Free Coca-Cola or Werther's originals for throat soothing. Delsym  Cough syrup 5 cc's every 12 hours Non-sedating antihistamine of your choice daily ( Zyrtec, Allegra, Xyzol, Claritin ( Generic ok)  If you develop any significant bleeding that does not stop , seek emergency care.  Continue using albuterol  as needed for shortness of breath or wheezing.  I have ordered Tessalon Perles to see if these help with cough. Take up to twice daily as needed for cough. I have ordered Augmentin for the discolored secretions. Take 1 tablet in the morning , and one in the evening x 7 days. Please us  probiotic while on antibiotic. I like Culturelle, which is over the counter.  I have referred you to Medical oncology for evaluation for treatment of the lung cancer. The PET scan does show additional areas of your lung, and right hilar lymph node that are suspicious for cancer.  Medical oncology can discuss with you options for treatment .  You will get a call to get this scheduled.  They will take great care of you.  Call if you have any questions. Call if the blood secretions do not resolve.  Follow up in 1 month with [name] NP to ensure you are better, and the cough has improved.  Will need 3 month follow up with Dr. Waylan Haggard or [facility] for ILD progression monitoring Please contact office for sooner follow up if symptoms do not improve or worsen or seek emergency care

## 2024-03-10 ENCOUNTER — Telehealth: Payer: Self-pay

## 2024-03-10 LAB — FUNGUS CULTURE RESULT

## 2024-03-10 LAB — FUNGAL ORGANISM REFLEX

## 2024-03-10 LAB — FUNGUS CULTURE WITH STAIN

## 2024-03-10 NOTE — Telephone Encounter (Signed)
 Copied from CRM [id number]. Topic: Clinical - Medical Advice >> Mar 10, 2024  4:06 PM [name] wrote: Reason for CRM: Patient is calling to let [name] know he hasn't heard anything from the oncologist.  Peter [name]

## 2024-03-12 ENCOUNTER — Ambulatory Visit: Admitting: Acute Care

## 2024-03-16 ENCOUNTER — Inpatient Hospital Stay

## 2024-03-16 ENCOUNTER — Other Ambulatory Visit: Payer: Self-pay | Admitting: Medical Oncology

## 2024-03-16 ENCOUNTER — Inpatient Hospital Stay: Attending: Internal Medicine | Admitting: Internal Medicine

## 2024-03-16 VITALS — BP 112/65 | HR 76 | Temp 97.9°F | Resp 18 | Ht 66.0 in | Wt 217.3 lb

## 2024-03-16 DIAGNOSIS — Z79620 Long term (current) use of immunosuppressive biologic: Secondary | ICD-10-CM | POA: Diagnosis not present

## 2024-03-16 DIAGNOSIS — R21 Rash and other nonspecific skin eruption: Secondary | ICD-10-CM | POA: Insufficient documentation

## 2024-03-16 DIAGNOSIS — C3431 Malignant neoplasm of lower lobe, right bronchus or lung: Secondary | ICD-10-CM

## 2024-03-16 DIAGNOSIS — Z5111 Encounter for antineoplastic chemotherapy: Secondary | ICD-10-CM | POA: Diagnosis not present

## 2024-03-16 DIAGNOSIS — I4891 Unspecified atrial fibrillation: Secondary | ICD-10-CM | POA: Insufficient documentation

## 2024-03-16 DIAGNOSIS — D61810 Antineoplastic chemotherapy induced pancytopenia: Secondary | ICD-10-CM | POA: Insufficient documentation

## 2024-03-16 DIAGNOSIS — J029 Acute pharyngitis, unspecified: Secondary | ICD-10-CM | POA: Diagnosis not present

## 2024-03-16 DIAGNOSIS — I5042 Chronic combined systolic (congestive) and diastolic (congestive) heart failure: Secondary | ICD-10-CM | POA: Insufficient documentation

## 2024-03-16 DIAGNOSIS — M109 Gout, unspecified: Secondary | ICD-10-CM | POA: Insufficient documentation

## 2024-03-16 DIAGNOSIS — D509 Iron deficiency anemia, unspecified: Secondary | ICD-10-CM | POA: Insufficient documentation

## 2024-03-16 DIAGNOSIS — N183 Chronic kidney disease, stage 3 unspecified: Secondary | ICD-10-CM | POA: Insufficient documentation

## 2024-03-16 DIAGNOSIS — Z5112 Encounter for antineoplastic immunotherapy: Secondary | ICD-10-CM | POA: Diagnosis not present

## 2024-03-16 DIAGNOSIS — Z888 Allergy status to other drugs, medicaments and biological substances status: Secondary | ICD-10-CM | POA: Insufficient documentation

## 2024-03-16 DIAGNOSIS — C3411 Malignant neoplasm of upper lobe, right bronchus or lung: Secondary | ICD-10-CM | POA: Diagnosis not present

## 2024-03-16 LAB — CMP (CANCER CENTER ONLY)
ALT: 19 U/L (ref 0–44)
AST: 23 U/L (ref 15–41)
Albumin: 4.5 g/dL (ref 3.5–5.0)
Alkaline Phosphatase: 102 U/L (ref 38–126)
Anion gap: 8 (ref 5–15)
BUN: 19 mg/dL (ref 8–23)
CO2: 29 mmol/L (ref 22–32)
Calcium: 10 mg/dL (ref 8.9–10.3)
Chloride: 103 mmol/L (ref 98–111)
Creatinine: 1.13 mg/dL (ref 0.61–1.24)
GFR, Estimated: >60 mL/min (ref >60)
Glucose, Bld: 128 mg/dL — ABNORMAL HIGH (ref 70–99)
Potassium: 4.2 mmol/L (ref 3.5–5.1)
Sodium: 140 mmol/L (ref 135–145)
Total Bilirubin: 0.5 mg/dL (ref 0.0–1.2)
Total Protein: 7.7 g/dL (ref 6.5–8.1)

## 2024-03-16 LAB — CBC WITH DIFFERENTIAL (CANCER CENTER ONLY)
Abs Immature Granulocytes: 0.02 10*3/uL (ref 0.00–0.07)
Basophils Absolute: 0 10*3/uL (ref 0.0–0.1)
Basophils Relative: 0 %
Eosinophils Absolute: 0.1 10*3/uL (ref 0.0–0.5)
Eosinophils Relative: 2 %
HCT: 39.7 % (ref 39.0–52.0)
Hemoglobin: 13.9 g/dL (ref 13.0–17.0)
Immature Granulocytes: 0 %
Lymphocytes Relative: 19 %
Lymphs Abs: 1.7 10*3/uL (ref 0.7–4.0)
MCH: 33.7 pg (ref 26.0–34.0)
MCHC: 35 g/dL (ref 30.0–36.0)
MCV: 96.1 fL (ref 80.0–100.0)
Monocytes Absolute: 0.7 10*3/uL (ref 0.1–1.0)
Monocytes Relative: 7 %
Neutro Abs: 6.5 10*3/uL (ref 1.7–7.7)
Neutrophils Relative %: 72 %
Platelet Count: 174 10*3/uL (ref 150–400)
RBC: 4.13 MIL/uL — ABNORMAL LOW (ref 4.22–5.81)
RDW: 14 % (ref 11.5–15.5)
WBC Count: 9 10*3/uL (ref 4.0–10.5)
nRBC: 0 % (ref 0.0–0.2)

## 2024-03-16 MED ORDER — ONDANSETRON HCL 8 MG PO TABS: 8.0000 mg | ORAL_TABLET | Freq: Three times a day (TID) | ORAL | 1 refills | Status: DC | PRN

## 2024-03-16 MED ORDER — PROCHLORPERAZINE MALEATE 10 MG PO TABS: 10.0000 mg | ORAL_TABLET | Freq: Four times a day (QID) | ORAL | 1 refills | Status: DC | PRN

## 2024-03-16 MED ORDER — FOLIC ACID 1 MG PO TABS: 1.0000 mg | ORAL_TABLET | Freq: Every day | ORAL | 3 refills | Status: DC

## 2024-03-16 MED ORDER — LIDOCAINE-PRILOCAINE 2.5-2.5 % EX CREA: TOPICAL_CREAM | CUTANEOUS | 3 refills | Status: DC

## 2024-03-16 MED ORDER — CYANOCOBALAMIN 1000 MCG/ML IJ SOLN
1000.0000 ug | Freq: Once | INTRAMUSCULAR | Status: AC
Start: 2024-03-16 — End: 2024-03-16
  Administered 2024-03-16: 1000 ug via INTRAMUSCULAR
  Filled 2024-03-16: qty 1

## 2024-03-16 NOTE — Progress Notes (Signed)
 [facility] [phone]   [phone]  CONSULT NOTE  REFERRING PHYSICIAN: Dr. Racheal Buddle  REASON FOR CONSULTATION:  78 years old white male recently diagnosed with lung cancer  HPI Peter [name] is a 78 y.o. male.   Discussed the use of AI scribe software for clinical note transcription with the patient, who gave verbal consent to proceed.  History of Present Illness   Peter [name] is a 78 year old male with lung cancer who presents for initial consultation. He is accompanied by his wife, Peter [name]. He was referred by his cardiologist for further evaluation after initial findings of lung masses.  In early 2025, he experienced significant shortness of breath and weakness while working part-time at a [employer]. Initial chest X-rays did not reveal any abnormalities. Suspecting congestive heart failure, he consulted his cardiologist, who recommended further imaging. An urgent care chest X-ray led to an emergency room visit due to suspected flu and pneumonia.  During a hospital stay from February 10th to February 17th, a CT scan revealed a mass-like consolidation in the right lung apex and right lower lobe. Initially treated as pneumonia with antibiotics, a follow-up CT angiogram on March 13th showed no change in the mass and revealed abrupt occlusion of the right pulmonary artery and mediastinal lymph nodes. A bronchoscopy on March 31st confirmed adenocarcinoma of the lung.  Subsequent diagnostic workup included a PET scan and MRI brain on April 14th, which showed no brain metastasis. The PET scan showed Consolidative masslike opacity identified in the right lower lobe has abnormal radiotracer uptake and worrisome for potential neoplasm. The similar area in the right upper lobe with central cavitary or bleb and has also abnormal uptake but not as intense as the right lower lobe focus. The small areas of nodularity in the middle lobe also show  concerning level of uptake. Several hypermetabolic right hilar nodes are identified worrisome for areas of disease.  A pulmonary function test on March 25th indicated no significant obstruction. He has no current chest pain but experiences easy shortness of breath and occasional cough, which was managed with Augmentin  and Tessalon  until he ran out of medication.  No significant weight loss, nausea, vomiting, diarrhea, headaches, or vision changes. He has a history of coughing up pinkish sputum, attributed to throat irritation.  His past medical history includes arthritis, chronic heart disease, chronic kidney disease, depression, high cholesterol, acid reflux, GI bleed, iron deficiency anemia, atrial fibrillation, sleep apnea, and gout managed with allopurinol . He experienced a significant glucose spike to 500 mg/dL after taking prednisone  for a gout flare, requiring emergency treatment.  He has no personal history of smoking or significant alcohol  use, and no family history of lung cancer. His father died of a massive heart attack.      HPI  Past Medical History:  Diagnosis Date   Arthritis    At risk for sleep apnea    STOP-BANG= 5       SENT TO PCP 07-12-2015   CAD (coronary artery disease)    a. s/p PCI with DES x 3 to LAD in 2006 with bifurcation lesion and Plavix  indefinitely was recommended.   Chronic combined systolic and diastolic CHF (congestive heart failure) (HCC)    a. Prior EF normal but EF dropped to 30-35% in 07/2016 in setting of AFIB.   CKD (chronic kidney disease), stage III (HCC)    Depression    Dyslipidemia    Dyspnea  tired quick d/t Afib   Dyspnea on exertion    Dysrhythmia    A.FIB   GERD (gastroesophageal reflux disease)    History of GI bleed    History of kidney stones    History of MI (myocardial infarction)    11-05-2005--  periprocedural MI  (cardiac cath) per dr Grandville Lax note   Hypertension    Hypertriglyceridemia    Iron deficiency anemia     Myocardial infarction [facility])    Persistent atrial fibrillation (HCC)    a. 07/2016, started on Eliquis  -> readmitted later that month for AF RVR/acute HF - initially unsuccessful DCCV following TEE, so loaded with amio with repeat successful DCCV 07/2816.   Right ureteral stone    S/P drug eluting coronary stent placement    x3  to LAD  2006   Sleep apnea    wears BIPAP, 14 up to 24    Past Surgical History:  Procedure Laterality Date   BRONCHIAL BIOPSY  02/09/2024   Procedure: BRONCHOSCOPY, WITH BIOPSY;  Surgeon: Denson Flake, MD;  Location: [facility] ENDOSCOPY;  Service: Pulmonary;;   BRONCHIAL BRUSHINGS  02/09/2024   Procedure: BRONCHOSCOPY, WITH BRUSH BIOPSY;  Surgeon: Denson Flake, MD;  Location: [facility] ENDOSCOPY;  Service: Pulmonary;;   BRONCHIAL NEEDLE ASPIRATION BIOPSY  02/09/2024   Procedure: BRONCHOSCOPY, WITH NEEDLE ASPIRATION BIOPSY;  Surgeon: Denson Flake, MD;  Location: [facility] ENDOSCOPY;  Service: Pulmonary;;   BRONCHIAL WASHINGS  02/09/2024   Procedure: IRRIGATION, BRONCHUS;  Surgeon: Denson Flake, MD;  Location: [facility] ENDOSCOPY;  Service: Pulmonary;;   CARDIOVASCULAR STRESS TEST  01-06-2013  dr Ardell Koller   normal perfusion study/  no ischemia or infarct/  normal LV function and wall motion , ef 57%   CARDIOVERSION N/A 08/05/2016   Procedure: CARDIOVERSION;  Surgeon: Luana Rumple, MD;  Location: [facility] ENDOSCOPY;  Service: Cardiovascular;  Laterality: N/A;   CARDIOVERSION N/A 08/08/2016   Procedure: CARDIOVERSION;  Surgeon: Hugh Madura, MD;  Location: [facility] ENDOSCOPY;  Service: Cardiovascular;  Laterality: N/A;   CORONARY ANGIOPLASTY  11-13-2005 dr Grandville Lax   Successful touch up post-dilatation of the 3 tandem overlying Cypher stents in proximal to mid LAD (based on IVUS 0% stenosis and patent diagonal branch)/  Cutting Balloon Angioplasty of Ramus branch   CORONARY ANGIOPLASTY WITH STENT PLACEMENT  11-05-2005   dr Therese Flash brodie   PCI and DES x3 to birfurcation lesion LAD and diagonal branch of  LAD/   20% dLM,  30-40% RCA, ef 60%   CYSTOSCOPY WITH RETROGRADE PYELOGRAM, URETEROSCOPY AND STENT PLACEMENT Right 07/14/2015   Procedure: CYSTOSCOPY WITH RETROGRADE PYELOGRAM, URETEROSCOPY AND STENT PLACEMENT;  Surgeon: Osborn Blaze, MD;  Location: [facility];  Service: Urology;  Laterality: Right;   ELECTROPHYSIOLOGIC STUDY N/A 10/24/2016   Procedure: Atrial Fibrillation Ablation;  Surgeon: Will Cortland Ding, MD;  Location: [facility] INVASIVE CV LAB;  Service: Cardiovascular;  Laterality: N/A;   EXTRACORPOREAL SHOCK WAVE LITHOTRIPSY  yrs ago   HEMORRHOID SURGERY N/A 10/24/2017   Procedure: HEMORRHOIDECTOMY;  Surgeon: Melvenia Stabs, MD;  Location: [facility] OR;  Service: General;  Laterality: N/A;   HERNIA REPAIR Bilateral 2000   Inguinal Hernia   IR GENERIC HISTORICAL  10/23/2016   IR FLUORO GUIDE CV LINE RIGHT 10/23/2016 Lucinda Saber, MD [facility]-INTER[facility]   IR GENERIC HISTORICAL  10/23/2016   IR US  GUIDE VASC ACCESS RIGHT 10/23/2016 Lucinda Saber, MD [facility]   LEFT URETEROSCOPIC STONE EXTRACTION  04/10/2006   STONE EXTRACTION WITH BASKET Right  07/14/2015   Procedure: STONE EXTRACTION WITH BASKET;  Surgeon: Osborn Blaze, MD;  Location: [facility];  Service: Urology;  Laterality: Right;   TEE WITHOUT CARDIOVERSION N/A 08/05/2016   Procedure: TRANSESOPHAGEAL ECHOCARDIOGRAM (TEE);  Surgeon: Luana Rumple, MD;  Location: [facility] ENDOSCOPY;  Service: Cardiovascular;  Laterality: N/A;   TOTAL HIP ARTHROPLASTY Right 07/16/2022   Procedure: RIGHT TOTAL HIP ARTHROPLASTY ANTERIOR APPROACH;  Surgeon: Wes Hamman, MD;  Location: [facility] OR;  Service: Orthopedics;  Laterality: Right;  3-C   TRANSTHORACIC ECHOCARDIOGRAM  02/04/2012   grade I diastolic dysfunction/  ef 55-60%/  trivial MR and TR/  mild LAE   VIDEO BRONCHOSCOPY WITH ENDOBRONCHIAL NAVIGATION Right 02/09/2024   Procedure: VIDEO BRONCHOSCOPY WITH ENDOBRONCHIAL NAVIGATION;  Surgeon: Denson Flake, MD;  Location: [facility]  ENDOSCOPY;  Service: Pulmonary;  Laterality: Right;  WITH FLOURO    Family History  Problem Relation Age of Onset   Heart attack Father 42   Congestive Heart Failure Father 78   Cancer Paternal Uncle    Cancer Other     Social History Social History   Tobacco Use   Smoking status: Never    Passive exposure: Past   Smokeless tobacco: Never  Vaping Use   Vaping status: Never Used  Substance Use Topics   Alcohol  use: No    Comment: 1 beer a year   Drug use: No    Allergies  Allergen Reactions   Prednisone  Other (See Comments)    Made BS increase to 500- discovered on 01/22/24   Spironolactone  Other (See Comments)    Tender breast    Current Outpatient Medications  Medication Sig Dispense Refill   albuterol  (VENTOLIN  HFA) 108 (90 Base) MCG/ACT inhaler Inhale 2 puffs into the lungs every 6 (six) hours as needed for wheezing or shortness of breath (intractable cough). 8 g 2   allopurinol  (ZYLOPRIM ) 100 MG tablet Take 100 mg by mouth at bedtime.     allopurinol  (ZYLOPRIM ) 300 MG tablet Take 300 mg by mouth in the morning.     atorvastatin  (LIPITOR) 40 MG tablet TAKE 1 TABLET EVERY DAY 90 tablet 1   benzonatate  (TESSALON  PERLES) 100 MG capsule Take 2 capsules (200 mg total) by mouth 2 (two) times daily as needed for cough. 30 capsule 1   budeson-glycopyrrolate-formoterol (BREZTRI  AEROSPHERE) 160-9-4.8 MCG/ACT AERO Inhale 2 puffs into the lungs in the morning and at bedtime.     carvedilol  (COREG ) 25 MG tablet TAKE 1 TABLET TWICE DAILY WITH MEALS 180 tablet 3   Cyanocobalamin  (VITAMIN B-12 PO) Take 1 tablet by mouth daily.     diphenhydramine -acetaminophen  (TYLENOL  PM) 25-500 MG TABS tablet Take 1 tablet by mouth at bedtime.     ferrous sulfate  325 (65 FE) MG tablet Take 1 tablet (325 mg total) by mouth every morning. 90 tablet 2   furosemide  (LASIX ) 40 MG tablet TAKE 1 TABLET EVERY DAY 90 tablet 3   lisinopril  (ZESTRIL ) 40 MG tablet TAKE 1 TABLET EVERY DAY 90 tablet 3    nitroGLYCERIN  (NITROSTAT ) 0.4 MG SL tablet Place 1 tablet (0.4 mg total) under the tongue every 5 (five) minutes as needed for chest pain. X 3 doses 25 tablet 5   polyvinyl alcohol  (LIQUIFILM TEARS) 1.4 % ophthalmic solution Place 1 drop into both eyes daily as needed for dry eyes.     rivaroxaban  (XARELTO ) 20 MG TABS tablet Take 1 tablet (20 mg total) by mouth at bedtime. Okay to restart this medication on 02/10/24  sertraline  (ZOLOFT ) 25 MG tablet Take 25 mg by mouth daily.     No current facility-administered medications for this visit.    Review of Systems  Constitutional: positive for fatigue Eyes: negative Ears, nose, mouth, throat, and face: negative Respiratory: positive for cough and dyspnea on exertion Cardiovascular: negative Gastrointestinal: negative Genitourinary:negative Integument/breast: negative Hematologic/lymphatic: negative Musculoskeletal:negative Neurological: negative Behavioral/Psych: negative Endocrine: negative Allergic/Immunologic: negative  Physical Exam  ZOX:WRUEA, healthy, no distress, well nourished, and well developed SKIN: skin color, texture, turgor are normal, no rashes or significant lesions HEAD: Normocephalic, No masses, lesions, tenderness or abnormalities EYES: normal, PERRLA, Conjunctiva are pink and non-injected EARS: External ears normal, Canals clear OROPHARYNX:no exudate, no erythema, and lips, buccal mucosa, and tongue normal  NECK: supple, no adenopathy, no JVD LYMPH:  no palpable lymphadenopathy, no hepatosplenomegaly LUNGS: clear to auscultation , and palpation HEART: regular rate & rhythm, no murmurs, and no gallops ABDOMEN:abdomen soft, non-tender, normal bowel sounds, and no masses or organomegaly BACK: Back symmetric, no curvature., No CVA tenderness EXTREMITIES:no joint deformities, effusion, or inflammation, no edema  NEURO: alert & oriented x 3 with fluent speech, no focal motor/sensory deficits  PERFORMANCE STATUS:  ECOG 1  LABORATORY DATA: Lab Results  Component Value Date   WBC 13.2 (H) 01/23/2024   HGB 11.0 (L) 01/23/2024   HCT 32.8 (L) 01/23/2024   MCV 100.0 01/23/2024   PLT 163 01/23/2024      Chemistry      Component Value Date/Time   NA 136 01/23/2024 0220   K 4.0 01/23/2024 0220   CL 104 01/23/2024 0220   CO2 19 (L) 01/23/2024 0220   BUN 24 (H) 01/23/2024 0220   CREATININE 0.94 01/23/2024 0220   CREATININE 1.72 (H) 10/16/2016 1115      Component Value Date/Time   CALCIUM  8.9 01/23/2024 0220   ALKPHOS 74 01/23/2024 0220   AST 25 01/23/2024 0220   ALT 25 01/23/2024 0220   BILITOT 0.8 01/23/2024 0220       RADIOGRAPHIC STUDIES: NM PET Image Initial (PI) Skull Base To Thigh Result Date: 02/26/2024 CLINICAL DATA:  Initial treatment strategy for non-small-cell lung cancer. EXAM: NUCLEAR MEDICINE PET SKULL BASE TO THIGH TECHNIQUE: 10.8 mCi F-18 FDG was injected intravenously. Full-ring PET imaging was performed from the skull base to thigh after the radiotracer. CT data was obtained and used for attenuation correction and anatomic localization. Fasting blood glucose: 153 mg/dl COMPARISON:  CT 54/07/8118 FINDINGS: Mediastinal blood pool activity: SUV max 3.8 Liver activity: SUV max 3.5 NECK: There is no specific abnormal radiotracer uptake in the neck including along lymph node change of the submandibular, posterior triangle or internal jugular region. There is symmetric uptake along the visualized portions of the intracranial compartment. Incidental CT findings: The parotid glands, submandibular glands and thyroid  glands unremarkable. Visualized portions of the paranasal sinuses and mastoid air cells are clear. Mild vascular calcifications are seen. CHEST: There is a nodular infiltrative opacity once again identified in the right lower lobe superolateral which is diffusely hypermetabolic with maximum SUV value of 9.1. This area in the axial plane on CT image 76 measures 4.9 by 3.8 cm and  extends from the hilum out to the pleura. The consolidative right upper lobe opacity has more low level uptake with some central areas of maximum SUV of 6.7. This area appears similar to previous. There is a central volar cavitary focus within this area as described previously. Narrowing of the central bronchi as well. On the prior examination were  several smaller areas of nodularity towards the middle lobe as well which are again seen today and do have some uptake maximum SUV of 3.8. Additional small areas of disease are in the differential. In terms of hypermetabolic nodes there are several right hilar nodes identified. Example includes in the hilum of the middle lobe has an area of maximum SUV of 5.8 corresponding to a focus on CT image 71 measuring 17 by 9 mm. These are also area more superiorly maximum SUV value 6.7 on CT image 67 with node in this location measuring 13 mm. There are some additional nodes identified as well along the mediastinum and right paratracheal which have low level uptake with maximum SUV value of 3.3. Example right paratracheal on series 4, image 61 measuring 18 x 15 mm. Some prominent left-sided nodes, subcarinal nodes are also seen but do not have significant abnormal uptake. Incidental CT findings: Tiny pleural effusions are identified. Diffuse coarse interstitial lung changes identified with interstitial septal thickening. Significant breathing motion. Diffuse vascular calcifications are identified including along the coronary arteries. The heart is enlarged. There is a large hiatal hernia. Patulous esophagus with some wall thickening. Slight uptake identified, likely physiologic. ABDOMEN/PELVIS: There is physiologic distribution radiotracer along the parenchymal organs, bowel and renal collecting systems. Incidental CT findings: Diffuse vascular calcifications. Stone in the nondilated gallbladder. Mild bilateral renal atrophy. No renal or ureteral stones clearly identified. Bowel  is nondilated with scattered colonic stool. No obstruction. No free air or free fluid identified at this time. Normal appendix. SKELETON: No osseous areas of abnormal radiotracer uptake in the visualized field. There is streak artifact related to patient's right hip arthroplasty. Curvature of the spine with diffuse degenerative changes. IMPRESSION: Consolidative masslike opacity identified in the right lower lobe has abnormal radiotracer uptake and worrisome for potential neoplasm. The similar area in the right upper lobe with central cavitary or bleb and has also abnormal uptake but not as intense as the right lower lobe focus. The small areas of nodularity in the middle lobe also show concerning level of uptake. Several hypermetabolic right hilar nodes are identified worrisome for areas of disease. There is some additional nodes in the mediastinum which are pathologically enlarged but do not have significant uptake above blood pool. Attention on follow-up. No abnormal radiotracer uptake seen beneath the diaphragm or in the neck. Large hiatal hernia.  Gallstone.  Chronic lung changes. Electronically Signed   By: Adrianna Horde M.D.   On: 02/26/2024 10:44   MR BRAIN W WO CONTRAST Result Date: 02/23/2024 CLINICAL DATA:  Provided history: Malignant neoplasm of unspecified part of unspecified bronchus or lung. Non-small cell lung cancer, staging. EXAM: MRI HEAD WITHOUT AND WITH CONTRAST TECHNIQUE: Multiplanar, multiecho pulse sequences of the brain and surrounding structures were obtained without and with intravenous contrast. CONTRAST:  10mL GADAVIST  GADOBUTROL  1 MMOL/ML IV SOLN COMPARISON:  None. FINDINGS: Brain: Mild-to-moderate generalized cerebral atrophy. T2 hyperintense focus within the left thalamus likely reflecting a prominent perivascular space. No cortical encephalomalacia is identified. No significant cerebral white matter disease for age. There is no acute infarct. No evidence of an intracranial mass. No  extra-axial fluid collection. No midline shift. No pathologic intracranial enhancement identified. Vascular: Maintained flow voids within the proximal large arterial vessels. Left frontoparietal lobe developmental venous anomaly (anatomic variant). Skull and upper cervical spine: No focal worrisome marrow lesion. Incompletely assessed cervical spondylosis. Facet ankylosis on the left at C3-C4 and C4-C5. Sinuses/Orbits: No mass or acute finding within the imaged orbits.  No significant paranasal sinus disease. Other: Trace fluid within bilateral mastoid air cells. IMPRESSION: 1. No evidence of intracranial metastatic disease. 2. Mild-to-moderate generalized cerebral atrophy. 3. Trace fluid within bilateral mastoid air cells. Electronically Signed   By: Bascom Lily D.O.   On: 02/23/2024 18:39    ASSESSMENT: This is a very pleasant 78 years old white male with at least stage IIIA (T4, N1, M0) non-small cell lung cancer, adenocarcinoma presented with right lower lobe lung mass in addition to suspicious right upper lobe lesion and right hilar lymphadenopathy diagnosed in April 2025.  Molecular studies and PD-L1 expression showed no actionable mutations and negative PD-L1 expression.   PLAN: I had a lengthy discussion with the patient and his wife today about his current disease stage, prognosis and treatment options. The size of the tumor and location is currently large to be included in 1 radiation field.  We discussed his case at the weekly thoracic conference and it was felt that neoadjuvant course of chemoimmunotherapy may be advisable to shrink the tumor before consideration of a course of concurrent chemoradiation. He is not a good surgical candidate for resection. Assessment and Plan    Stage 3B non-small cell lung cancer Stage 3B non-small cell lung cancer, adenocarcinoma type, in the right upper and lower lobes with mediastinal lymph node involvement. No brain metastasis on MRI. The PET scan showed  Consolidative masslike opacity identified in the right lower lobe has abnormal radiotracer uptake and worrisome for potential neoplasm. The similar area in the right upper lobe with central cavitary or bleb and has also abnormal uptake but not as intense as the right lower lobe focus. The small areas of nodularity in the middle lobe also show concerning level of uptake. Several hypermetabolic right hilar nodes are identified worrisome for areas of disease. Molecular marker testing negative for mutations and PD-L1. Surgery not feasible due to tumor location and extent. Plan to initiate chemotherapy and immunotherapy to shrink the tumor before considering concurrent chemoradiation. Discussed potential chemotherapy side effects, including fatigue and mild nausea, but not significant alopecia. Emphasized the importance of a port for easier chemotherapy administration. - Administer chemotherapy with carboplatin, Alimta, and Keytruda every three weeks for three cycles. - Perform follow-up scan after three cycles to assess tumor response. - Consider concurrent chemoradiation if tumor volume decreases. - Administer B12 injection today. - Start folic acid  supplementation.  Chronic heart disease Chronic heart disease with previous episodes of congestive heart failure. No current symptoms of heart failure or acute cardiac issues.  Atrial fibrillation Atrial fibrillation with no acute episodes reported.  Chronic kidney disease Chronic kidney disease with well-controlled creatinine levels. No acute issues.  Gout Gout, well-managed with allopurinol . Recent flare managed with prednisone , leading to hyperglycemia.  Prednisone  allergy Allergy to prednisone , causing significant hyperglycemia. Noted in medical history to avoid future use.   The patient was advised to call immediately if he has any concerning symptoms in the interval.  The patient voices understanding of current disease status and treatment  options and is in agreement with the current care plan.  All questions were answered. The patient knows to call the clinic with any problems, questions or concerns. We can certainly see the patient much sooner if necessary.  Thank you so much for allowing me to participate in the care of Peter [name]. I will continue to follow up the patient with you and assist in his care.  The total time spent in the appointment was 90 minutes.  Disclaimer: This note was dictated with voice recognition software. Similar sounding words can inadvertently be transcribed and may not be corrected upon review.   Aurelio Blower Mar 16, 2024, 2:17 PM

## 2024-03-16 NOTE — Progress Notes (Signed)
 START OFF PATHWAY REGIMEN - Non-Small Cell Lung   [id number]:Pembrolizumab 200 mg  IV D1 + Pemetrexed 500 mg/m2 IV D1 + Carboplatin AUC=5 IV D1 q21 Days:   A cycle is every 21 days:     Pembrolizumab      Pemetrexed      Carboplatin   **Always confirm dose/schedule in your pharmacy ordering system**  Patient Characteristics: Preoperative or Nonsurgical Candidate (Clinical Staging), Stage IIB (N2a only) or Stage III - Nonsurgical Candidate, PS = 0,1 Therapeutic Status: Preoperative or Nonsurgical Candidate (Clinical Staging) AJCC T Category: cT4 AJCC N Category: cN1 AJCC M Category: cM0 AJCC 9 Stage Grouping: IIIA Check here if patient was staged using an edition other than AJCC Staging 9th Edition: false ECOG Performance Status: 1 Intent of Therapy: Curative Intent, Discussed with Patient

## 2024-03-17 ENCOUNTER — Other Ambulatory Visit: Payer: Self-pay

## 2024-03-17 ENCOUNTER — Encounter: Payer: Self-pay | Admitting: Internal Medicine

## 2024-03-17 ENCOUNTER — Other Ambulatory Visit: Payer: Self-pay | Admitting: Cardiology

## 2024-03-17 NOTE — Progress Notes (Signed)
 I met the pt today for the first time at his consult with Dr.Mohamed. Pt was accompanied by his wife, [name]. The plan for the pt is for him to have chemoimmunotherapy with carbo/alimta/keytruda to start the week of 5/12 for 3 rounds, followed by a scan, and if a good response is achieved, consider chemoradiation. I provided the pt with a Lung Cancer Journey book with information on the [facility] program, a Living with Cancer pamphlet and I encouraged him to explore the Lung Cancer support group. Pt doesn't want to pursue getting a port at this time. He will get his first treatment with a PIV and decide if he would like to get one. Pt is aware that he will be getting weekly labs and will need a chemo education class prior to starting treatment. Pt will receive a B12 injection today and will go to lab afterwards. No questions at the end of the appt, however I encouraged the pt and his wife to call with any needs.

## 2024-03-18 ENCOUNTER — Other Ambulatory Visit: Payer: Self-pay

## 2024-03-22 ENCOUNTER — Inpatient Hospital Stay

## 2024-03-22 DIAGNOSIS — C3431 Malignant neoplasm of lower lobe, right bronchus or lung: Secondary | ICD-10-CM

## 2024-03-23 ENCOUNTER — Inpatient Hospital Stay: Admitting: Licensed Clinical Social Worker

## 2024-03-23 ENCOUNTER — Inpatient Hospital Stay

## 2024-03-23 VITALS — BP 148/89 | HR 70 | Temp 98.7°F | Resp 19 | Wt 219.5 lb

## 2024-03-23 DIAGNOSIS — D509 Iron deficiency anemia, unspecified: Secondary | ICD-10-CM | POA: Diagnosis not present

## 2024-03-23 DIAGNOSIS — C3431 Malignant neoplasm of lower lobe, right bronchus or lung: Secondary | ICD-10-CM

## 2024-03-23 DIAGNOSIS — Z5112 Encounter for antineoplastic immunotherapy: Secondary | ICD-10-CM | POA: Diagnosis not present

## 2024-03-23 DIAGNOSIS — I4891 Unspecified atrial fibrillation: Secondary | ICD-10-CM | POA: Diagnosis not present

## 2024-03-23 DIAGNOSIS — N183 Chronic kidney disease, stage 3 unspecified: Secondary | ICD-10-CM | POA: Diagnosis not present

## 2024-03-23 DIAGNOSIS — D61810 Antineoplastic chemotherapy induced pancytopenia: Secondary | ICD-10-CM | POA: Diagnosis not present

## 2024-03-23 DIAGNOSIS — C3411 Malignant neoplasm of upper lobe, right bronchus or lung: Secondary | ICD-10-CM | POA: Diagnosis not present

## 2024-03-23 DIAGNOSIS — M109 Gout, unspecified: Secondary | ICD-10-CM | POA: Diagnosis not present

## 2024-03-23 DIAGNOSIS — Z5111 Encounter for antineoplastic chemotherapy: Secondary | ICD-10-CM | POA: Diagnosis not present

## 2024-03-23 DIAGNOSIS — I5042 Chronic combined systolic (congestive) and diastolic (congestive) heart failure: Secondary | ICD-10-CM | POA: Diagnosis not present

## 2024-03-23 LAB — CMP (CANCER CENTER ONLY)
ALT: 18 U/L (ref 0–44)
AST: 21 U/L (ref 15–41)
Albumin: 4.1 g/dL (ref 3.5–5.0)
Alkaline Phosphatase: 95 U/L (ref 38–126)
Anion gap: 7 (ref 5–15)
BUN: 22 mg/dL (ref 8–23)
CO2: 27 mmol/L (ref 22–32)
Calcium: 9.5 mg/dL (ref 8.9–10.3)
Chloride: 106 mmol/L (ref 98–111)
Creatinine: 1.21 mg/dL (ref 0.61–1.24)
GFR, Estimated: >60 mL/min (ref >60)
Glucose, Bld: 242 mg/dL — ABNORMAL HIGH (ref 70–99)
Potassium: 4.2 mmol/L (ref 3.5–5.1)
Sodium: 140 mmol/L (ref 135–145)
Total Bilirubin: 0.6 mg/dL (ref 0.0–1.2)
Total Protein: 7.1 g/dL (ref 6.5–8.1)

## 2024-03-23 LAB — CBC WITH DIFFERENTIAL (CANCER CENTER ONLY)
Abs Immature Granulocytes: 0.03 10*3/uL (ref 0.00–0.07)
Basophils Absolute: 0 10*3/uL (ref 0.0–0.1)
Basophils Relative: 0 %
Eosinophils Absolute: 0.2 10*3/uL (ref 0.0–0.5)
Eosinophils Relative: 3 %
HCT: 37.9 % — ABNORMAL LOW (ref 39.0–52.0)
Hemoglobin: 12.9 g/dL — ABNORMAL LOW (ref 13.0–17.0)
Immature Granulocytes: 0 %
Lymphocytes Relative: 14 %
Lymphs Abs: 1.2 10*3/uL (ref 0.7–4.0)
MCH: 33.1 pg (ref 26.0–34.0)
MCHC: 34 g/dL (ref 30.0–36.0)
MCV: 97.2 fL (ref 80.0–100.0)
Monocytes Absolute: 0.5 10*3/uL (ref 0.1–1.0)
Monocytes Relative: 6 %
Neutro Abs: 6.4 10*3/uL (ref 1.7–7.7)
Neutrophils Relative %: 77 %
Platelet Count: 152 10*3/uL (ref 150–400)
RBC: 3.9 MIL/uL — ABNORMAL LOW (ref 4.22–5.81)
RDW: 13.6 % (ref 11.5–15.5)
WBC Count: 8.3 10*3/uL (ref 4.0–10.5)
nRBC: 0 % (ref 0.0–0.2)

## 2024-03-23 LAB — TSH: TSH: 1.39 u[IU]/mL (ref 0.350–4.500)

## 2024-03-23 MED ORDER — SODIUM CHLORIDE 0.9 % IV SOLN
500.0000 mg/m2 | Freq: Once | INTRAVENOUS | Status: AC
  Administered 2024-03-23: 1100 mg via INTRAVENOUS
  Filled 2024-03-23: qty 40

## 2024-03-23 MED ORDER — DEXAMETHASONE SODIUM PHOSPHATE 10 MG/ML IJ SOLN
10.0000 mg | Freq: Once | INTRAMUSCULAR | Status: AC
  Administered 2024-03-23: 10 mg via INTRAVENOUS
  Filled 2024-03-23: qty 1

## 2024-03-23 MED ORDER — PEMBROLIZUMAB CHEMO INJECTION 100 MG/4ML
200.0000 mg | Freq: Once | INTRAVENOUS | Status: AC
  Administered 2024-03-23: 200 mg via INTRAVENOUS
  Filled 2024-03-23: qty 200

## 2024-03-23 MED ORDER — APREPITANT 130 MG/18ML IV EMUL
130.0000 mg | Freq: Once | INTRAVENOUS | Status: AC
  Administered 2024-03-23: 130 mg via INTRAVENOUS
  Filled 2024-03-23: qty 18

## 2024-03-23 MED ORDER — SODIUM CHLORIDE 0.9 % IV SOLN: INTRAVENOUS | Status: DC

## 2024-03-23 MED ORDER — SODIUM CHLORIDE 0.9 % IV SOLN
500.5000 mg | Freq: Once | INTRAVENOUS | Status: AC
  Administered 2024-03-23: 500.5 mg via INTRAVENOUS
  Filled 2024-03-23: qty 50.05

## 2024-03-23 MED ORDER — PALONOSETRON HCL INJECTION 0.25 MG/5ML
0.2500 mg | Freq: Once | INTRAVENOUS | Status: AC
  Administered 2024-03-23: 0.25 mg via INTRAVENOUS
  Filled 2024-03-23: qty 5

## 2024-03-23 NOTE — Progress Notes (Signed)
 [facility] Clinical Social Work  Initial Assessment   JEN CADOTTE is a 78 y.o. year old male seen in infusion. Clinical Social Work was referred by medical provider for assessment of psychosocial needs.   SDOH (Social Determinants of Health) assessments performed: Yes SDOH Interventions    Flowsheet Row ED to Hosp-Admission (Discharged) from 01/22/2024 in [facility]  SDOH Interventions   Transportation Interventions Inpatient TOC, Intervention Not Indicated, Patient Resources (Friends/Family)       SDOH Screenings   Food Insecurity: No Food Insecurity (02/19/2024)  Housing: Low Risk  (02/19/2024)  Transportation Needs: No Transportation Needs (02/19/2024)  Utilities: Not At Risk (02/19/2024)  Depression (PHQ2-9): Low Risk  (02/19/2024)  Social Connections: Moderately Integrated (01/23/2024)  Tobacco Use: Low Risk  (03/08/2024)     Distress Screen completed: Yes    03/22/2024   12:50 PM  ONCBCN DISTRESS SCREENING  Screening Type Initial Screening  How much distress have you been experiencing in the past week? (0-10) 6  Emotional concerns type Worry or anxiety      Family/Social Information:  Housing Arrangement: patient lives with his wife. Family members/support persons in your life? Pt reports he has children residing nearby who are able to assist if needed. Transportation concerns: no  Employment: Retired pt was working part time at [employer] to occupy his time and earn extra spending money but stopped w/ diagnosis.  Income source: Actor concerns: No Type of concern: None Food access concerns: no Religious or spiritual practice: Yes-Presbyterian Advanced directives: Not known Services Currently in place:  none  Coping/ Adjustment to diagnosis: Patient understands treatment plan and what happens next? yes Concerns about diagnosis and/or treatment: Overwhelmed by information, Afraid of cancer, and Quality of life Patient  reported stressors: Anxiety/ nervousness and Adjusting to my illness Hopes and/or priorities: Pt's priority is to continue treatment w/ the hope of positive results. Patient enjoys time with family/ friends Current coping skills/ strengths: Capable of independent living , Motivation for treatment/growth , Physical Health , and Supportive family/friends     SUMMARY: Current SDOH Barriers:  No barriers identified at this time.  Clinical Social Work Clinical Goal(s):  No clinical social work goals at this time  Interventions: Discussed common feeling and emotions when being diagnosed with cancer, and the importance of support during treatment Informed patient of the support team roles and support services at [facility] Provided CSW contact information and encouraged patient to call with any questions or concerns Informed pt of supportive counseling available to both himself and his wife.    Follow Up Plan: Patient will contact CSW with any support or resource needs Patient verbalizes understanding of plan: Yes    Quenton Bruns, LCSW Clinical Social Worker [facility]

## 2024-03-23 NOTE — Patient Instructions (Signed)
 [facility] - A DEPT OF [facility]  Discharge Instructions: Thank you for choosing [facility] to provide your oncology and hematology care.   If you have a lab appointment with the Cancer Center, please go directly to the Cancer Center and check in at the registration area.   Wear comfortable clothing and clothing appropriate for easy access to any Portacath or PICC line.   We strive to give you quality time with your provider. You may need to reschedule your appointment if you arrive late (15 or more minutes).  Arriving late affects you and other patients whose appointments are after yours.  Also, if you miss three or more appointments without notifying the office, you may be dismissed from the clinic at the provider's discretion.      For prescription refill requests, have your pharmacy contact our office and allow 72 hours for refills to be completed.    Today you received the following chemotherapy and/or immunotherapy agents: Keytruda, Alimta, and Carboplatin      To help prevent nausea and vomiting after your treatment, we encourage you to take your nausea medication as directed.  BELOW ARE SYMPTOMS THAT SHOULD BE REPORTED IMMEDIATELY: *FEVER GREATER THAN 100.4 F (38 C) OR HIGHER *CHILLS OR SWEATING *NAUSEA AND VOMITING THAT IS NOT CONTROLLED WITH YOUR NAUSEA MEDICATION *UNUSUAL SHORTNESS OF BREATH *UNUSUAL BRUISING OR BLEEDING *URINARY PROBLEMS (pain or burning when urinating, or frequent urination) *BOWEL PROBLEMS (unusual diarrhea, constipation, pain near the anus) TENDERNESS IN MOUTH AND THROAT WITH OR WITHOUT PRESENCE OF ULCERS (sore throat, sores in mouth, or a toothache) UNUSUAL RASH, SWELLING OR PAIN  UNUSUAL VAGINAL DISCHARGE OR ITCHING   Items with * indicate a potential emergency and should be followed up as soon as possible or go to the Emergency Department if any problems should occur.  Please show the CHEMOTHERAPY  ALERT CARD or IMMUNOTHERAPY ALERT CARD at check-in to the Emergency Department and triage nurse.  Should you have questions after your visit or need to cancel or reschedule your appointment, please contact [facility] - A DEPT OF [facility]  Dept: [phone]  and follow the prompts.  Office hours are 8:00 a.m. to 4:30 p.m. Monday - Friday. Please note that voicemails left after 4:00 p.m. may not be returned until the following business day.  We are closed weekends and major holidays. You have access to a nurse at all times for urgent questions. Please call the main number to the clinic Dept: [phone] and follow the prompts.   For any non-urgent questions, you may also contact your provider using MyChart. We now offer e-Visits for anyone 76 and older to request care online for non-urgent symptoms. For details visit [email or web address].   Also download the MyChart app! Go to the app store, search "MyChart", open the app, select [place], and log in with your MyChart username and password.  Pembrolizumab Injection What is this medication? PEMBROLIZUMAB (PEM broe LIZ ue mab) treats some types of cancer. It works by helping your immune system slow or stop the spread of cancer cells. It is a monoclonal antibody. This medicine may be used for other purposes; ask your health care provider or pharmacist if you have questions. COMMON BRAND NAME(S): Keytruda What should I tell my care team before I take this medication? They need to know if you have any of these conditions: Allogeneic stem cell transplant (uses someone  else's stem cells) Autoimmune diseases, such as Crohn disease, ulcerative colitis, lupus History of chest radiation Nervous system problems, such as Guillain-Barre syndrome, myasthenia gravis Organ transplant An unusual or allergic reaction to pembrolizumab, other medications, foods, dyes, or preservatives Pregnant or trying to get  pregnant Breast-feeding How should I use this medication? This medication is injected into a vein. It is given by your care team in a hospital or clinic setting. A special MedGuide will be given to you before each treatment. Be sure to read this information carefully each time. Talk to your care team about the use of this medication in children. While it may be prescribed for children as young as 6 months for selected conditions, precautions do apply. Overdosage: If you think you have taken too much of this medicine contact a poison control center or emergency room at once. NOTE: This medicine is only for you. Do not share this medicine with others. What if I miss a dose? Keep appointments for follow-up doses. It is important not to miss your dose. Call your care team if you are unable to keep an appointment. What may interact with this medication? Interactions have not been studied. This list may not describe all possible interactions. Give your health care provider a list of all the medicines, herbs, non-prescription drugs, or dietary supplements you use. Also tell them if you smoke, drink alcohol , or use illegal drugs. Some items may interact with your medicine. What should I watch for while using this medication? Your condition will be monitored carefully while you are receiving this medication. You may need blood work while taking this medication. This medication may cause serious skin reactions. They can happen weeks to months after starting the medication. Contact your care team right away if you notice fevers or flu-like symptoms with a rash. The rash may be red or purple and then turn into blisters or peeling of the skin. You may also notice a red rash with swelling of the face, lips, or lymph nodes in your neck or under your arms. Tell your care team right away if you have any change in your eyesight. Talk to your care team if you may be pregnant. Serious birth defects can occur if you  take this medication during pregnancy and for 4 months after the last dose. You will need a negative pregnancy test before starting this medication. Contraception is recommended while taking this medication and for 4 months after the last dose. Your care team can help you find the option that works for you. Do not breastfeed while taking this medication and for 4 months after the last dose. What side effects may I notice from receiving this medication? Side effects that you should report to your care team as soon as possible: Allergic reactions--skin rash, itching, hives, swelling of the face, lips, tongue, or throat Dry cough, shortness of breath or trouble breathing Eye pain, redness, irritation, or discharge with blurry or decreased vision Heart muscle inflammation--unusual weakness or fatigue, shortness of breath, chest pain, fast or irregular heartbeat, dizziness, swelling of the ankles, feet, or hands Hormone gland problems--headache, sensitivity to light, unusual weakness or fatigue, dizziness, fast or irregular heartbeat, increased sensitivity to cold or heat, excessive sweating, constipation, hair loss, increased thirst or amount of urine, tremors or shaking, irritability Infusion reactions--chest pain, shortness of breath or trouble breathing, feeling faint or lightheaded Kidney injury (glomerulonephritis)--decrease in the amount of urine, red or dark brown urine, foamy or bubbly urine, swelling of the ankles,  hands, or feet Liver injury--right upper belly pain, loss of appetite, nausea, light-colored stool, dark yellow or brown urine, yellowing skin or eyes, unusual weakness or fatigue Pain, tingling, or numbness in the hands or feet, muscle weakness, change in vision, confusion or trouble speaking, loss of balance or coordination, trouble walking, seizures Rash, fever, and swollen lymph nodes Redness, blistering, peeling, or loosening of the skin, including inside the mouth Sudden or  severe stomach pain, bloody diarrhea, fever, nausea, vomiting Side effects that usually do not require medical attention (report to your care team if they continue or are bothersome): Bone, joint, or muscle pain Diarrhea Fatigue Loss of appetite Nausea Skin rash This list may not describe all possible side effects. Call your doctor for medical advice about side effects. You may report side effects to FDA at 1-800-FDA-1088. Where should I keep my medication? This medication is given in a hospital or clinic. It will not be stored at home. NOTE: This sheet is a summary. It may not cover all possible information. If you have questions about this medicine, talk to your doctor, pharmacist, or health care provider.  2024 Elsevier/Gold Standard (2022-03-12 00:00:00) Pemetrexed Injection What is this medication? PEMETREXED (PEM e TREX ed) treats some types of cancer. It works by slowing down the growth of cancer cells. This medicine may be used for other purposes; ask your health care provider or pharmacist if you have questions. COMMON BRAND NAME(S): Alimta, PEMFEXY, PEMRYDI RTU What should I tell my care team before I take this medication? They need to know if you have any of these conditions: Infection, such as chickenpox, cold sores, or herpes Kidney disease Low blood cell levels (white cells, red cells, and platelets) Lung or breathing disease, such as asthma Radiation therapy An unusual or allergic reaction to pemetrexed, other medications, foods, dyes, or preservatives If you or your partner are pregnant or trying to get pregnant Breast-feeding How should I use this medication? This medication is injected into a vein. It is given by your care team in a hospital or clinic setting. Talk to your care team about the use of this medication in children. Special care may be needed. Overdosage: If you think you have taken too much of this medicine contact a poison control center or emergency  room at once. NOTE: This medicine is only for you. Do not share this medicine with others. What if I miss a dose? Keep appointments for follow-up doses. It is important not to miss your dose. Call your care team if you are unable to keep an appointment. What may interact with this medication? Do not take this medication with any of the following: Live virus vaccines This medication may also interact with the following: Ibuprofen This list may not describe all possible interactions. Give your health care provider a list of all the medicines, herbs, non-prescription drugs, or dietary supplements you use. Also tell them if you smoke, drink alcohol , or use illegal drugs. Some items may interact with your medicine. What should I watch for while using this medication? Your condition will be monitored carefully while you are receiving this medication. This medication may make you feel generally unwell. This is not uncommon as chemotherapy can affect healthy cells as well as cancer cells. Report any side effects. Continue your course of treatment even though you feel ill unless your care team tells you to stop. This medication can cause serious side effects. To reduce the risk, your care team may give you other medications  to take before receiving this one. Be sure to follow the directions from your care team. This medication can cause a rash or redness in areas of the body that have previously had radiation therapy. If you have had radiation therapy, tell your care team if you notice a rash in this area. This medication may increase your risk of getting an infection. Call your care team for advice if you get a fever, chills, sore throat, or other symptoms of a cold or flu. Do not treat yourself. Try to avoid being around people who are sick. Be careful brushing or flossing your teeth or using a toothpick because you may get an infection or bleed more easily. If you have any dental work done, tell your  dentist you are receiving this medication. Avoid taking medications that contain aspirin , acetaminophen , ibuprofen, naproxen, or ketoprofen unless instructed by your care team. These medications may hide a fever. Check with your care team if you have severe diarrhea, nausea, and vomiting, or if you sweat a lot. The loss of too much body fluid may make it dangerous for you to take this medication. Talk to your care team if you or your partner wish to become pregnant or think either of you might be pregnant. This medication can cause serious birth defects if taken during pregnancy and for 6 months after the last dose. A negative pregnancy test is required before starting this medication. A reliable form of contraception is recommended while taking this medication and for 6 months after the last dose. Talk to your care team about reliable forms of contraception. Do not father a child while taking this medication and for 3 months after the last dose. Use a condom while having sex during this time period. Do not breastfeed while taking this medication and for 1 week after the last dose. This medication may cause infertility. Talk to your care team if you are concerned about your fertility. What side effects may I notice from receiving this medication? Side effects that you should report to your care team as soon as possible: Allergic reactions--skin rash, itching, hives, swelling of the face, lips, tongue, or throat Dry cough, shortness of breath or trouble breathing Infection--fever, chills, cough, sore throat, wounds that don't heal, pain or trouble when passing urine, general feeling of discomfort or being unwell Kidney injury--decrease in the amount of urine, swelling of the ankles, hands, or feet Low red blood cell level--unusual weakness or fatigue, dizziness, headache, trouble breathing Redness, blistering, peeling, or loosening of the skin, including inside the mouth Unusual bruising or  bleeding Side effects that usually do not require medical attention (report to your care team if they continue or are bothersome): Fatigue Loss of appetite Nausea Vomiting This list may not describe all possible side effects. Call your doctor for medical advice about side effects. You may report side effects to FDA at 1-800-FDA-1088. Where should I keep my medication? This medication is given in a hospital or clinic. It will not be stored at home. NOTE: This sheet is a summary. It may not cover all possible information. If you have questions about this medicine, talk to your doctor, pharmacist, or health care provider.  2024 Elsevier/Gold Standard (2022-03-05 00:00:00) Carboplatin Injection What is this medication? CARBOPLATIN (KAR boe pla tin) treats some types of cancer. It works by slowing down the growth of cancer cells. This medicine may be used for other purposes; ask your health care provider or pharmacist if you have questions. COMMON BRAND  NAME(S): Paraplatin What should I tell my care team before I take this medication? They need to know if you have any of these conditions: Blood disorders Hearing problems Kidney disease Recent or ongoing radiation therapy An unusual or allergic reaction to carboplatin, cisplatin, other medications, foods, dyes, or preservatives Pregnant or trying to get pregnant Breast-feeding How should I use this medication? This medication is injected into a vein. It is given by your care team in a hospital or clinic setting. Talk to your care team about the use of this medication in children. Special care may be needed. Overdosage: If you think you have taken too much of this medicine contact a poison control center or emergency room at once. NOTE: This medicine is only for you. Do not share this medicine with others. What if I miss a dose? Keep appointments for follow-up doses. It is important not to miss your dose. Call your care team if you are  unable to keep an appointment. What may interact with this medication? Medications for seizures Some antibiotics, such as amikacin, gentamicin , neomycin , streptomycin, tobramycin Vaccines This list may not describe all possible interactions. Give your health care provider a list of all the medicines, herbs, non-prescription drugs, or dietary supplements you use. Also tell them if you smoke, drink alcohol , or use illegal drugs. Some items may interact with your medicine. What should I watch for while using this medication? Your condition will be monitored carefully while you are receiving this medication. You may need blood work while taking this medication. This medication may make you feel generally unwell. This is not uncommon, as chemotherapy can affect healthy cells as well as cancer cells. Report any side effects. Continue your course of treatment even though you feel ill unless your care team tells you to stop. In some cases, you may be given additional medications to help with side effects. Follow all directions for their use. This medication may increase your risk of getting an infection. Call your care team for advice if you get a fever, chills, sore throat, or other symptoms of a cold or flu. Do not treat yourself. Try to avoid being around people who are sick. Avoid taking medications that contain aspirin , acetaminophen , ibuprofen, naproxen, or ketoprofen unless instructed by your care team. These medications may hide a fever. Be careful brushing or flossing your teeth or using a toothpick because you may get an infection or bleed more easily. If you have any dental work done, tell your dentist you are receiving this medication. Talk to your care team if you wish to become pregnant or think you might be pregnant. This medication can cause serious birth defects. Talk to your care team about effective forms of contraception. Do not breast-feed while taking this medication. What side effects  may I notice from receiving this medication? Side effects that you should report to your care team as soon as possible: Allergic reactions--skin rash, itching, hives, swelling of the face, lips, tongue, or throat Infection--fever, chills, cough, sore throat, wounds that don't heal, pain or trouble when passing urine, general feeling of discomfort or being unwell Low red blood cell level--unusual weakness or fatigue, dizziness, headache, trouble breathing Pain, tingling, or numbness in the hands or feet, muscle weakness, change in vision, confusion or trouble speaking, loss of balance or coordination, trouble walking, seizures Unusual bruising or bleeding Side effects that usually do not require medical attention (report to your care team if they continue or are bothersome): Hair loss Nausea Unusual  weakness or fatigue Vomiting This list may not describe all possible side effects. Call your doctor for medical advice about side effects. You may report side effects to FDA at 1-800-FDA-1088. Where should I keep my medication? This medication is given in a hospital or clinic. It will not be stored at home. NOTE: This sheet is a summary. It may not cover all possible information. If you have questions about this medicine, talk to your doctor, pharmacist, or health care provider.  2024 Elsevier/Gold Standard (2022-02-19 00:00:00)

## 2024-03-24 ENCOUNTER — Telehealth: Payer: Self-pay

## 2024-03-24 ENCOUNTER — Other Ambulatory Visit: Payer: Self-pay

## 2024-03-24 LAB — ACID FAST CULTURE WITH REFLEXED SENSITIVITIES (MYCOBACTERIA)
Acid Fast Culture: NEGATIVE
Acid Fast Culture: NEGATIVE

## 2024-03-24 LAB — T4: T4, Total: 6.4 ug/dL (ref 4.5–12.0)

## 2024-03-24 NOTE — Telephone Encounter (Signed)
-----   Message from Nurse Darry Endo sent at 03/23/2024  4:02 PM EDT ----- Regarding: First time/ Keytruda, Alimta, and Carboplatin/ Dr Marguerita Shih pt Pt had first time Keytruda, alimta, and carboplatin. Tolerated well.   Thanks!

## 2024-03-24 NOTE — Telephone Encounter (Signed)
 LM for patient that this nurse was calling to see how they were doing after their treatment. Please call back to Dr. Asa Lente nurse at [phone] if they have any questions or concerns regarding the treatment.

## 2024-03-26 ENCOUNTER — Telehealth: Payer: Self-pay

## 2024-03-26 NOTE — Telephone Encounter (Signed)
 In error.

## 2024-03-29 ENCOUNTER — Other Ambulatory Visit: Payer: Self-pay | Admitting: Cardiology

## 2024-03-29 ENCOUNTER — Telehealth: Payer: Self-pay | Admitting: Medical Oncology

## 2024-03-29 NOTE — Telephone Encounter (Addendum)
 Patient reports dysphagia and oral stomatitis, describing his mouth as feeling like it has been "tilled" and stating it feels like a bad sore throat. Symptoms began yesterday. Patient also notes that on Friday, he felt as if he had been "hit by a boat paddle."  He is currently drinking Crystal Light. Patient had his first dose of Carboplatin , Alimta, and Keytruda  one week ago.  I advised the patient to begin gargling with salt water  to help alleviate symptoms. I will consult Dr. Marguerita Shih for further guidance. Patient's next appointment is scheduled for Thursday.

## 2024-03-30 ENCOUNTER — Telehealth: Payer: Self-pay | Admitting: Medical Oncology

## 2024-03-30 NOTE — Telephone Encounter (Signed)
 The patient reported the presence of a facial rash today, characterized by small bumps. He stated that the rash affects the side of his face, extending beneath the nose and around the eye area. He described the sensation as burning but noted no itching. The patient also mentioned that his mouth remains sore.  The patient has applied triamcinolone  cream to the rash but is uncertain whether it is appropriate to continue its use.

## 2024-03-31 ENCOUNTER — Other Ambulatory Visit: Payer: Self-pay | Admitting: Internal Medicine

## 2024-03-31 ENCOUNTER — Other Ambulatory Visit: Payer: Self-pay | Admitting: *Deleted

## 2024-03-31 DIAGNOSIS — C3431 Malignant neoplasm of lower lobe, right bronchus or lung: Secondary | ICD-10-CM

## 2024-04-01 ENCOUNTER — Inpatient Hospital Stay (HOSPITAL_BASED_OUTPATIENT_CLINIC_OR_DEPARTMENT_OTHER): Admitting: Internal Medicine

## 2024-04-01 ENCOUNTER — Inpatient Hospital Stay

## 2024-04-01 VITALS — BP 100/59 | HR 79 | Temp 98.0°F | Resp 16 | Wt 212.0 lb

## 2024-04-01 DIAGNOSIS — I4891 Unspecified atrial fibrillation: Secondary | ICD-10-CM | POA: Diagnosis not present

## 2024-04-01 DIAGNOSIS — D61810 Antineoplastic chemotherapy induced pancytopenia: Secondary | ICD-10-CM | POA: Diagnosis not present

## 2024-04-01 DIAGNOSIS — C3431 Malignant neoplasm of lower lobe, right bronchus or lung: Secondary | ICD-10-CM | POA: Diagnosis not present

## 2024-04-01 DIAGNOSIS — D509 Iron deficiency anemia, unspecified: Secondary | ICD-10-CM | POA: Diagnosis not present

## 2024-04-01 DIAGNOSIS — C3411 Malignant neoplasm of upper lobe, right bronchus or lung: Secondary | ICD-10-CM | POA: Diagnosis not present

## 2024-04-01 DIAGNOSIS — Z5112 Encounter for antineoplastic immunotherapy: Secondary | ICD-10-CM | POA: Diagnosis not present

## 2024-04-01 DIAGNOSIS — Z5111 Encounter for antineoplastic chemotherapy: Secondary | ICD-10-CM | POA: Diagnosis not present

## 2024-04-01 DIAGNOSIS — N183 Chronic kidney disease, stage 3 unspecified: Secondary | ICD-10-CM | POA: Diagnosis not present

## 2024-04-01 DIAGNOSIS — M109 Gout, unspecified: Secondary | ICD-10-CM | POA: Diagnosis not present

## 2024-04-01 DIAGNOSIS — I5042 Chronic combined systolic (congestive) and diastolic (congestive) heart failure: Secondary | ICD-10-CM | POA: Diagnosis not present

## 2024-04-01 LAB — CBC WITH DIFFERENTIAL (CANCER CENTER ONLY)
Abs Immature Granulocytes: 0.01 10*3/uL (ref 0.00–0.07)
Basophils Absolute: 0 10*3/uL (ref 0.0–0.1)
Basophils Relative: 0 %
Eosinophils Absolute: 0.1 10*3/uL (ref 0.0–0.5)
Eosinophils Relative: 3 %
HCT: 36 % — ABNORMAL LOW (ref 39.0–52.0)
Hemoglobin: 12.5 g/dL — ABNORMAL LOW (ref 13.0–17.0)
Immature Granulocytes: 0 %
Lymphocytes Relative: 21 %
Lymphs Abs: 0.9 10*3/uL (ref 0.7–4.0)
MCH: 33.5 pg (ref 26.0–34.0)
MCHC: 34.7 g/dL (ref 30.0–36.0)
MCV: 96.5 fL (ref 80.0–100.0)
Monocytes Absolute: 0.1 10*3/uL (ref 0.1–1.0)
Monocytes Relative: 1 %
Neutro Abs: 3.1 10*3/uL (ref 1.7–7.7)
Neutrophils Relative %: 75 %
Platelet Count: 77 10*3/uL — ABNORMAL LOW (ref 150–400)
RBC: 3.73 MIL/uL — ABNORMAL LOW (ref 4.22–5.81)
RDW: 13.2 % (ref 11.5–15.5)
WBC Count: 4.2 10*3/uL (ref 4.0–10.5)
nRBC: 0 % (ref 0.0–0.2)

## 2024-04-01 LAB — CMP (CANCER CENTER ONLY)
ALT: 22 U/L (ref 0–44)
AST: 24 U/L (ref 15–41)
Albumin: 4.1 g/dL (ref 3.5–5.0)
Alkaline Phosphatase: 98 U/L (ref 38–126)
Anion gap: 7 (ref 5–15)
BUN: 61 mg/dL — ABNORMAL HIGH (ref 8–23)
CO2: 24 mmol/L (ref 22–32)
Calcium: 9.6 mg/dL (ref 8.9–10.3)
Chloride: 108 mmol/L (ref 98–111)
Creatinine: 1.82 mg/dL — ABNORMAL HIGH (ref 0.61–1.24)
GFR, Estimated: 38 mL/min — ABNORMAL LOW (ref >60)
Glucose, Bld: 141 mg/dL — ABNORMAL HIGH (ref 70–99)
Potassium: 5 mmol/L (ref 3.5–5.1)
Sodium: 139 mmol/L (ref 135–145)
Total Bilirubin: 0.6 mg/dL (ref 0.0–1.2)
Total Protein: 7 g/dL (ref 6.5–8.1)

## 2024-04-01 MED ORDER — HYDROCORTISONE 1 % EX LOTN: 1.0000 | TOPICAL_LOTION | Freq: Two times a day (BID) | CUTANEOUS | 0 refills | Status: AC

## 2024-04-01 NOTE — Progress Notes (Signed)
 [facility] [phone]   [phone]  OFFICE PROGRESS NOTE  Koirala, [name], MD [address] [place] [address]  DIAGNOSIS: Stage IIIA (T4, N1, M0) non-small cell lung cancer, adenocarcinoma presented with right lower lobe lung mass in addition to suspicious right upper lobe lesion and right hilar lymphadenopathy diagnosed in April 2025.   Biomarker Findings HRD signature - Cannot Be Determined Microsatellite status - Cannot Be Determined ? Tumor Mutational Burden - Cannot Be Determined Genomic Findings For a complete list of the genes assayed, please refer to the Appendix. AKT2 amplification CDKN2A p16INK4a R80* and p14ARF P94L KRAS G12D ASXL1 W796fs*22 GNAS R201H U2AF1 S34F 7 Disease relevant genes with no reportable alterations: ALK, BRAF, EGFR, ERBB2, MET, RET, ROS1  PDL1 TPS: 0%  PRIOR THERAPY: None  CURRENT THERAPY: Neoadjuvant systemic chemotherapy with carboplatin  for AUC of 5, Alimta 500 Mg/M2 and Keytruda  200 Mg IV every 3 weeks.  Status post 1 cycle.  INTERVAL HISTORY: Peter [name] 78 y.o. male returns to the clinic today for follow-up visit accompanied by his wife.Discussed the use of AI scribe software for clinical note transcription with the patient, who gave verbal consent to proceed.  History of Present Illness   Peter [name] is a 78 year old male with stage three non-small cell lung cancer who presents with adverse effects of chemotherapy. He is accompanied by his wife.  Diagnosed with stage three non-small cell lung cancer, adenocarcinoma, in April 2015, he is undergoing neoadjuvant systemic chemo-immunotherapy with carboplatin , Alimta, and Keytruda . Initially, he felt well after the first cycle but began experiencing significant fatigue, describing it as feeling 'like somebody beat me with an open stick.'  He developed a rash primarily on his face, which has been persistent but is  reportedly improving. No nausea, vomiting, or diarrhea. He has a metallic taste in his mouth and a general lack of taste in food.  He experienced a transient episode of visual disturbance while driving, where he could see objects but not read them clearly. This episode resolved after he took a nap.  He has a sore throat, which he has been managing with saltwater gargles.  He notes a weight loss of six pounds since his treatment began, dropping from 218 to 212 pounds, attributed to a decreased appetite and reduced food intake.  He has discontinued several medications, including Breztri  and Tessalon , as he feels they were not effective. He was prescribed an antibiotic for a cough, which has since resolved.  He wants to maintain his independence and continue activities such as going out for breakfast.       MEDICAL HISTORY: Past Medical History:  Diagnosis Date   Arthritis    At risk for sleep apnea    STOP-BANG= 5       SENT TO PCP 07-12-2015   CAD (coronary artery disease)    a. s/p PCI with DES x 3 to LAD in 2006 with bifurcation lesion and Plavix  indefinitely was recommended.   Chronic combined systolic and diastolic CHF (congestive heart failure) (HCC)    a. Prior EF normal but EF dropped to 30-35% in 07/2016 in setting of AFIB.   CKD (chronic kidney disease), stage III (HCC)    Depression    Dyslipidemia    Dyspnea    tired quick d/t Afib   Dyspnea on exertion    Dysrhythmia    A.FIB   GERD (gastroesophageal reflux disease)  History of GI bleed    History of kidney stones    History of MI (myocardial infarction)    11-05-2005--  periprocedural MI  (cardiac cath) per dr Grandville Lax note   Hypertension    Hypertriglyceridemia    Iron deficiency anemia    Myocardial infarction [facility])    Persistent atrial fibrillation (HCC)    a. 07/2016, started on Eliquis  -> readmitted later that month for AF RVR/acute HF - initially unsuccessful DCCV following TEE, so loaded with amio with  repeat successful DCCV 07/2816.   Right ureteral stone    S/P drug eluting coronary stent placement    x3  to LAD  2006   Sleep apnea    wears BIPAP, 14 up to 24    ALLERGIES:  is allergic to prednisone  and spironolactone .  MEDICATIONS:  Current Outpatient Medications  Medication Sig Dispense Refill   albuterol  (VENTOLIN  HFA) 108 (90 Base) MCG/ACT inhaler Inhale 2 puffs into the lungs every 6 (six) hours as needed for wheezing or shortness of breath (intractable cough). 8 g 2   allopurinol  (ZYLOPRIM ) 100 MG tablet Take 100 mg by mouth at bedtime.     allopurinol  (ZYLOPRIM ) 300 MG tablet Take 300 mg by mouth in the morning.     atorvastatin  (LIPITOR) 40 MG tablet TAKE 1 TABLET EVERY DAY 30 tablet 0   benzonatate  (TESSALON  PERLES) 100 MG capsule Take 2 capsules (200 mg total) by mouth 2 (two) times daily as needed for cough. 30 capsule 1   budeson-glycopyrrolate-formoterol (BREZTRI  AEROSPHERE) 160-9-4.8 MCG/ACT AERO Inhale 2 puffs into the lungs in the morning and at bedtime.     carvedilol  (COREG ) 25 MG tablet TAKE 1 TABLET TWICE DAILY WITH MEALS 180 tablet 3   Cyanocobalamin  (VITAMIN B-12 PO) Take 1 tablet by mouth daily.     diphenhydramine -acetaminophen  (TYLENOL  PM) 25-500 MG TABS tablet Take 1 tablet by mouth at bedtime.     ferrous sulfate  325 (65 FE) MG tablet Take 1 tablet (325 mg total) by mouth every morning. 90 tablet 2   folic acid  (FOLVITE ) 1 MG tablet Take 1 tablet (1 mg total) by mouth daily. Start 7 days before pemetrexed  chemotherapy. Continue until 21 days after pemetrexed  completed. 100 tablet 3   furosemide  (LASIX ) 40 MG tablet TAKE 1 TABLET EVERY DAY 90 tablet 3   lidocaine -prilocaine  (EMLA ) cream Apply to affected area once 30 g 3   lisinopril  (ZESTRIL ) 40 MG tablet TAKE 1 TABLET EVERY DAY 90 tablet 3   nitroGLYCERIN  (NITROSTAT ) 0.4 MG SL tablet Place 1 tablet (0.4 mg total) under the tongue every 5 (five) minutes as needed for chest pain. X 3 doses 25 tablet 5    ondansetron  (ZOFRAN ) 8 MG tablet Take 1 tablet (8 mg total) by mouth every 8 (eight) hours as needed for nausea or vomiting. Start on the third day after carboplatin . 30 tablet 1   polyvinyl alcohol  (LIQUIFILM TEARS) 1.4 % ophthalmic solution Place 1 drop into both eyes daily as needed for dry eyes.     prochlorperazine  (COMPAZINE ) 10 MG tablet Take 1 tablet (10 mg total) by mouth every 6 (six) hours as needed for nausea or vomiting. 30 tablet 1   rivaroxaban  (XARELTO ) 20 MG TABS tablet Take 1 tablet (20 mg total) by mouth at bedtime. Okay to restart this medication on 02/10/24     sertraline  (ZOLOFT ) 25 MG tablet Take 25 mg by mouth daily.     No current facility-administered medications for this visit.  SURGICAL HISTORY:  Past Surgical History:  Procedure Laterality Date   BRONCHIAL BIOPSY  02/09/2024   Procedure: BRONCHOSCOPY, WITH BIOPSY;  Surgeon: Denson Flake, MD;  Location: [facility] ENDOSCOPY;  Service: Pulmonary;;   BRONCHIAL BRUSHINGS  02/09/2024   Procedure: BRONCHOSCOPY, WITH BRUSH BIOPSY;  Surgeon: Denson Flake, MD;  Location: [facility] ENDOSCOPY;  Service: Pulmonary;;   BRONCHIAL NEEDLE ASPIRATION BIOPSY  02/09/2024   Procedure: BRONCHOSCOPY, WITH NEEDLE ASPIRATION BIOPSY;  Surgeon: Denson Flake, MD;  Location: [facility] ENDOSCOPY;  Service: Pulmonary;;   BRONCHIAL WASHINGS  02/09/2024   Procedure: IRRIGATION, BRONCHUS;  Surgeon: Denson Flake, MD;  Location: [facility] ENDOSCOPY;  Service: Pulmonary;;   CARDIOVASCULAR STRESS TEST  01-06-2013  dr Ardell Koller   normal perfusion study/  no ischemia or infarct/  normal LV function and wall motion , ef 57%   CARDIOVERSION N/A 08/05/2016   Procedure: CARDIOVERSION;  Surgeon: Luana Rumple, MD;  Location: [facility] ENDOSCOPY;  Service: Cardiovascular;  Laterality: N/A;   CARDIOVERSION N/A 08/08/2016   Procedure: CARDIOVERSION;  Surgeon: Hugh Madura, MD;  Location: [facility] ENDOSCOPY;  Service: Cardiovascular;  Laterality: N/A;   CORONARY ANGIOPLASTY  11-13-2005 dr Grandville Lax    Successful touch up post-dilatation of the 3 tandem overlying Cypher stents in proximal to mid LAD (based on IVUS 0% stenosis and patent diagonal branch)/  Cutting Balloon Angioplasty of Ramus branch   CORONARY ANGIOPLASTY WITH STENT PLACEMENT  11-05-2005   dr Therese Flash brodie   PCI and DES x3 to birfurcation lesion LAD and diagonal branch of LAD/   20% dLM,  30-40% RCA, ef 60%   CYSTOSCOPY WITH RETROGRADE PYELOGRAM, URETEROSCOPY AND STENT PLACEMENT Right 07/14/2015   Procedure: CYSTOSCOPY WITH RETROGRADE PYELOGRAM, URETEROSCOPY AND STENT PLACEMENT;  Surgeon: Osborn Blaze, MD;  Location: [facility];  Service: Urology;  Laterality: Right;   ELECTROPHYSIOLOGIC STUDY N/A 10/24/2016   Procedure: Atrial Fibrillation Ablation;  Surgeon: Will Cortland Ding, MD;  Location: [facility] INVASIVE CV LAB;  Service: Cardiovascular;  Laterality: N/A;   EXTRACORPOREAL SHOCK WAVE LITHOTRIPSY  yrs ago   HEMORRHOID SURGERY N/A 10/24/2017   Procedure: HEMORRHOIDECTOMY;  Surgeon: Melvenia Stabs, MD;  Location: [facility] OR;  Service: General;  Laterality: N/A;   HERNIA REPAIR Bilateral 2000   Inguinal Hernia   IR GENERIC HISTORICAL  10/23/2016   IR FLUORO GUIDE CV LINE RIGHT 10/23/2016 Lucinda Saber, MD [facility]   IR GENERIC HISTORICAL  10/23/2016   IR US  GUIDE VASC ACCESS RIGHT 10/23/2016 Lucinda Saber, MD [facility]   LEFT URETEROSCOPIC STONE EXTRACTION  04/10/2006   STONE EXTRACTION WITH BASKET Right 07/14/2015   Procedure: STONE EXTRACTION WITH BASKET;  Surgeon: Osborn Blaze, MD;  Location: [facility];  Service: Urology;  Laterality: Right;   TEE WITHOUT CARDIOVERSION N/A 08/05/2016   Procedure: TRANSESOPHAGEAL ECHOCARDIOGRAM (TEE);  Surgeon: Luana Rumple, MD;  Location: [facility] ENDOSCOPY;  Service: Cardiovascular;  Laterality: N/A;   TOTAL HIP ARTHROPLASTY Right 07/16/2022   Procedure: RIGHT TOTAL HIP ARTHROPLASTY ANTERIOR APPROACH;  Surgeon: Wes Hamman, MD;  Location: [facility] OR;   Service: Orthopedics;  Laterality: Right;  3-C   TRANSTHORACIC ECHOCARDIOGRAM  02/04/2012   grade I diastolic dysfunction/  ef 55-60%/  trivial MR and TR/  mild LAE   VIDEO BRONCHOSCOPY WITH ENDOBRONCHIAL NAVIGATION Right 02/09/2024   Procedure: VIDEO BRONCHOSCOPY WITH ENDOBRONCHIAL NAVIGATION;  Surgeon: Denson Flake, MD;  Location: [facility] ENDOSCOPY;  Service: Pulmonary;  Laterality: Right;  WITH FLOURO    REVIEW OF SYSTEMS:  Constitutional: positive for fatigue Eyes: negative Ears, nose, mouth, throat, and face: negative Respiratory: negative Cardiovascular: negative Gastrointestinal: negative Genitourinary:negative Integument/breast: positive for rash Hematologic/lymphatic: negative Musculoskeletal:negative Neurological: negative Behavioral/Psych: negative Endocrine: negative Allergic/Immunologic: negative   PHYSICAL EXAMINATION: General appearance: alert, cooperative, fatigued, and no distress Head: Normocephalic, without obvious abnormality, atraumatic Neck: no adenopathy, no JVD, supple, symmetrical, trachea midline, and thyroid  not enlarged, symmetric, no tenderness/mass/nodules Lymph nodes: Cervical, supraclavicular, and axillary nodes normal. Resp: clear to auscultation bilaterally Back: symmetric, no curvature. ROM normal. No CVA tenderness. Cardio: regular rate and rhythm, S1, S2 normal, no murmur, click, rub or gallop GI: soft, non-tender; bowel sounds normal; no masses,  no organomegaly Extremities: extremities normal, atraumatic, no cyanosis or edema Neurologic: Alert and oriented X 3, normal strength and tone. Normal symmetric reflexes. Normal coordination and gait  ECOG PERFORMANCE STATUS: 1 - Symptomatic but completely ambulatory  Blood pressure (!) 100/59, pulse 79, temperature 98 F (36.7 C), resp. rate 16, weight 212 lb (96.2 kg), SpO2 98%.  LABORATORY DATA: Lab Results  Component Value Date   WBC 4.2 04/01/2024   HGB 12.5 (L) 04/01/2024   HCT 36.0 (L)  04/01/2024   MCV 96.5 04/01/2024   PLT 77 (L) 04/01/2024      Chemistry      Component Value Date/Time   NA 140 03/23/2024 1231   K 4.2 03/23/2024 1231   CL 106 03/23/2024 1231   CO2 27 03/23/2024 1231   BUN 22 03/23/2024 1231   CREATININE 1.21 03/23/2024 1231   CREATININE 1.72 (H) 10/16/2016 1115      Component Value Date/Time   CALCIUM  9.5 03/23/2024 1231   ALKPHOS 95 03/23/2024 1231   AST 21 03/23/2024 1231   ALT 18 03/23/2024 1231   BILITOT 0.6 03/23/2024 1231       RADIOGRAPHIC STUDIES: No results found.  ASSESSMENT AND PLAN: This is a very pleasant 78 years old white male with Stage IIIA (T4, N1, M0) non-small cell lung cancer, adenocarcinoma presented with right lower lobe lung mass in addition to suspicious right upper lobe lesion and right hilar lymphadenopathy diagnosed in April 2025.  Molecular studies showed no actionable mutations and PD-L1 expression was negative. The patient is currently undergoing neoadjuvant chemoimmunotherapy with carboplatin  AUC of 5, Alimta 500 Mg/M2 and Keytruda  200 Mg IV every 3 weeks status post 1 cycle.  He tolerated the first cycle of his treatment well except for mild fatigue and mild skin rash on the face.    Stage 3 non-small cell lung cancer, adenocarcinoma Stage 3 non-small cell lung cancer, adenocarcinoma, initially diagnosed in April 2015. Presenting with a right lower lobe lung mass, suspicious right upper lobe lesion, and right hilar lymphadenopathy. No actionable mutations and negative PD-L1 expression. Currently undergoing neoadjuvant systemic chemoimmunotherapy with carboplatin , Alimta, and Keytruda , status post one cycle. Reports fatigue and weight loss of 6 pounds since the last treatment. No significant nausea, vomiting, or diarrhea. Experiencing metallic taste and transient visual disturbances, which resolved after rest. Expressed concerns about continuing chemotherapy due to side effects but reassured about the treatment  plan and prognosis.  Adverse effect of chemotherapy Experiencing a rash primarily on the face, which has improved with current treatment. No nausea, vomiting, or diarrhea reported. Complains of a sore throat, which is being managed with salt water  gargles. Reports fatigue and a metallic taste in the mouth. Transient visual disturbances noted but resolved after rest. Discussed the potential interaction of high-dose steroids with immunotherapy, opting for topical treatment instead. -  Prescribe 1% hydrocortisone  cream for rash.   The patient was advised to call immediately if he has any concerning symptoms in the interval. The patient voices understanding of current disease status and treatment options and is in agreement with the current care plan.  All questions were answered. The patient knows to call the clinic with any problems, questions or concerns. We can certainly see the patient much sooner if necessary.  The total time spent in the appointment was 30 minutes.  Disclaimer: This note was dictated with voice recognition software. Similar sounding words can inadvertently be transcribed and may not be corrected upon review.

## 2024-04-06 ENCOUNTER — Inpatient Hospital Stay (HOSPITAL_BASED_OUTPATIENT_CLINIC_OR_DEPARTMENT_OTHER)

## 2024-04-06 ENCOUNTER — Other Ambulatory Visit: Payer: Self-pay | Admitting: Physician Assistant

## 2024-04-06 ENCOUNTER — Inpatient Hospital Stay (HOSPITAL_BASED_OUTPATIENT_CLINIC_OR_DEPARTMENT_OTHER): Admitting: Physician Assistant

## 2024-04-06 VITALS — BP 122/70 | HR 101 | Temp 98.3°F | Resp 18 | Ht 66.0 in | Wt 217.9 lb

## 2024-04-06 DIAGNOSIS — C3431 Malignant neoplasm of lower lobe, right bronchus or lung: Secondary | ICD-10-CM | POA: Diagnosis not present

## 2024-04-06 DIAGNOSIS — I4891 Unspecified atrial fibrillation: Secondary | ICD-10-CM | POA: Diagnosis not present

## 2024-04-06 DIAGNOSIS — C3411 Malignant neoplasm of upper lobe, right bronchus or lung: Secondary | ICD-10-CM | POA: Diagnosis not present

## 2024-04-06 DIAGNOSIS — D509 Iron deficiency anemia, unspecified: Secondary | ICD-10-CM | POA: Diagnosis not present

## 2024-04-06 DIAGNOSIS — Z5111 Encounter for antineoplastic chemotherapy: Secondary | ICD-10-CM | POA: Diagnosis not present

## 2024-04-06 DIAGNOSIS — R21 Rash and other nonspecific skin eruption: Secondary | ICD-10-CM | POA: Diagnosis not present

## 2024-04-06 DIAGNOSIS — T451X5A Adverse effect of antineoplastic and immunosuppressive drugs, initial encounter: Secondary | ICD-10-CM

## 2024-04-06 DIAGNOSIS — D61810 Antineoplastic chemotherapy induced pancytopenia: Secondary | ICD-10-CM

## 2024-04-06 DIAGNOSIS — M109 Gout, unspecified: Secondary | ICD-10-CM | POA: Diagnosis not present

## 2024-04-06 DIAGNOSIS — Z5112 Encounter for antineoplastic immunotherapy: Secondary | ICD-10-CM | POA: Diagnosis not present

## 2024-04-06 DIAGNOSIS — N183 Chronic kidney disease, stage 3 unspecified: Secondary | ICD-10-CM | POA: Diagnosis not present

## 2024-04-06 DIAGNOSIS — R239 Unspecified skin changes: Secondary | ICD-10-CM

## 2024-04-06 DIAGNOSIS — I5042 Chronic combined systolic (congestive) and diastolic (congestive) heart failure: Secondary | ICD-10-CM | POA: Diagnosis not present

## 2024-04-06 LAB — CBC WITH DIFFERENTIAL (CANCER CENTER ONLY)
Abs Immature Granulocytes: 0 10*3/uL (ref 0.00–0.07)
Basophils Absolute: 0 10*3/uL (ref 0.0–0.1)
Basophils Relative: 0 %
Eosinophils Absolute: 0 10*3/uL (ref 0.0–0.5)
Eosinophils Relative: 2 %
HCT: 31.2 % — ABNORMAL LOW (ref 39.0–52.0)
Hemoglobin: 10.9 g/dL — ABNORMAL LOW (ref 13.0–17.0)
Immature Granulocytes: 0 %
Lymphocytes Relative: 46 %
Lymphs Abs: 0.5 10*3/uL — ABNORMAL LOW (ref 0.7–4.0)
MCH: 33 pg (ref 26.0–34.0)
MCHC: 34.9 g/dL (ref 30.0–36.0)
MCV: 94.5 fL (ref 80.0–100.0)
Monocytes Absolute: 0.1 10*3/uL (ref 0.1–1.0)
Monocytes Relative: 5 %
Neutro Abs: 0.6 10*3/uL — ABNORMAL LOW (ref 1.7–7.7)
Neutrophils Relative %: 47 %
Platelet Count: 22 10*3/uL — ABNORMAL LOW (ref 150–400)
RBC: 3.3 MIL/uL — ABNORMAL LOW (ref 4.22–5.81)
RDW: 12.6 % (ref 11.5–15.5)
Smear Review: DECREASED
WBC Count: 1.2 10*3/uL — ABNORMAL LOW (ref 4.0–10.5)
nRBC: 0 % (ref 0.0–0.2)

## 2024-04-06 LAB — CMP (CANCER CENTER ONLY)
ALT: 20 U/L (ref 0–44)
AST: 19 U/L (ref 15–41)
Albumin: 4.1 g/dL (ref 3.5–5.0)
Alkaline Phosphatase: 100 U/L (ref 38–126)
Anion gap: 8 (ref 5–15)
BUN: 24 mg/dL — ABNORMAL HIGH (ref 8–23)
CO2: 23 mmol/L (ref 22–32)
Calcium: 9.2 mg/dL (ref 8.9–10.3)
Chloride: 108 mmol/L (ref 98–111)
Creatinine: 1.18 mg/dL (ref 0.61–1.24)
GFR, Estimated: >60 mL/min (ref >60)
Glucose, Bld: 233 mg/dL — ABNORMAL HIGH (ref 70–99)
Potassium: 4.5 mmol/L (ref 3.5–5.1)
Sodium: 139 mmol/L (ref 135–145)
Total Bilirubin: 0.6 mg/dL (ref 0.0–1.2)
Total Protein: 6.9 g/dL (ref 6.5–8.1)

## 2024-04-06 MED ORDER — PREDNISONE 20 MG PO TABS
20.0000 mg | ORAL_TABLET | Freq: Two times a day (BID) | ORAL | 0 refills | Status: DC
Start: 2024-04-07 — End: 2024-04-14

## 2024-04-06 NOTE — Progress Notes (Signed)
 Symptom Management Consult Note [facility]    Patient Care Team: Lanae Pinal, MD as PCP - General (Family Medicine) Lei Pump, MD as PCP - Electrophysiology (Cardiology) Millicent Ally, MD as PCP - Cardiology (Cardiology) Serafin Dames, MD as Consulting Physician (Gastroenterology) Rosalita Combe, RN as Oncology Nurse Navigator    Name / MRN / DOB: Peter [name]  [id number]  November 19, 1945   Date of visit: 04/06/2024   Chief Complaint/Reason for visit: rash   Current Therapy: carboplatin , alimta, keytruda   Last treatment:  Day 1   Cycle 1 on 03/23/24     ASSESSMENT AND PLAN Patient is a 78 y.o. male with oncologic history of stage IIIA (T4, N1, M0) non-small cell lung cancer, adenocarcinoma followed by Dr. Marguerita Shih.  I have viewed most recent oncology note and lab work.  #Stage IIIA (T4, N1, M0) non-small cell lung cancer, adenocarcinoma  - Next appointment with oncologist is 04/14/24   #Rash - Grade 2. - Patient had significant hyperglycemia with medrol  dosepak in the past.  - He is agreeable to 5 days of prednisone  20 mg BID in attempt to control rash. Topical steroid not appropriate given how diffuse rash is.   #Pancytopenia - Related to treatment - Discussed labs with Dr. Marguerita Shih: WBC 1.2, HgB 10.9, platelets 22k, ANC 0.6. Patient without fevers. He will need to hold Xarelto  x 3 days to allow time for counts to recover, trying to avoid holding it longer term to avoid cardiac related issues. - Discussed neutropenic and bleeding precautions. - Patient encouraged to RTC at the end of the week if only minimal improvement in symptoms.    Lengthy discussion had regarding ED precautions discussed should symptoms worsen. Teach back method utilized.    HEME/ONC HISTORY Oncology History  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)  03/16/2024 Initial Diagnosis   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)    03/16/2024 Cancer Staging   Staging form: Lung, AJCC V9 - Clinical: Stage IIIA (cT4, cN1, cM0) - Signed by Marlene Simas, MD on 03/16/2024 Method of lymph node assessment: Clinical   03/23/2024 -  Chemotherapy   Patient is on Treatment Plan : LUNG Carboplatin  (5) + Pemetrexed  (500) + Pembrolizumab  (200) D1 q21d Induction x 3 Cycles         INTERVAL HISTORY  Discussed the use of AI scribe software for clinical note transcription with the patient, who gave verbal consent to proceed.    Peter [name] is a 78 y.o. male with oncologic history as above presenting to [facility] today with chief complaint of rash. Patient presents unaccompanied to visit today  He developed a severe rash x 10 days. Rash started 5 days after his first chemotherapy treatment on 03/23/24. The rash initially appeared on his neck and has since spread to his arms, face, chest, and back. He notes that it is painful rather than itchy. The rash has significantly impacted his sleep, as movement causes discomfort. He has been applying over-the-counter cortisone cream to the affected areas, which has provided some relief for smaller spots, but the larger areas continue to bleed and cause discomfort. He is currently on Xarelto  for atrial fibrillation, which may contribute to bleeding risk. No fever, difficulty breathing, or rash in the groin or legs. No spots in the mouth. He reports a sore throat that lasted for five days, starting on Monday of last week, which made swallowing difficult. He managed the sore  throat by sipping hot salty water . The sore throat has since improved.  He has a history of elevated glucose levels when taking prednisone , with a previous episode resulting in a glucose level of 500 mg/dL, requiring overnight hospitalization and insulin  treatment. He is hesitant to take steroids again.     ROS  All other systems are reviewed and are negative for acute change except as noted in the HPI.    Allergies   Allergen Reactions   Prednisone  Other (See Comments)    Made BS increase to 500- discovered on 01/22/24   Spironolactone  Other (See Comments)    Tender breast     Past Medical History:  Diagnosis Date   Arthritis    At risk for sleep apnea    STOP-BANG= 5       SENT TO PCP 07-12-2015   CAD (coronary artery disease)    a. s/p PCI with DES x 3 to LAD in 2006 with bifurcation lesion and Plavix  indefinitely was recommended.   Chronic combined systolic and diastolic CHF (congestive heart failure) (HCC)    a. Prior EF normal but EF dropped to 30-35% in 07/2016 in setting of AFIB.   CKD (chronic kidney disease), stage III (HCC)    Depression    Dyslipidemia    Dyspnea    tired quick d/t Afib   Dyspnea on exertion    Dysrhythmia    A.FIB   GERD (gastroesophageal reflux disease)    History of GI bleed    History of kidney stones    History of MI (myocardial infarction)    11-05-2005--  periprocedural MI  (cardiac cath) per dr Grandville Lax note   Hypertension    Hypertriglyceridemia    Iron deficiency anemia    Myocardial infarction [facility])    Persistent atrial fibrillation (HCC)    a. 07/2016, started on Eliquis  -> readmitted later that month for AF RVR/acute HF - initially unsuccessful DCCV following TEE, so loaded with amio with repeat successful DCCV 07/2816.   Right ureteral stone    S/P drug eluting coronary stent placement    x3  to LAD  2006   Sleep apnea    wears BIPAP, 14 up to 24     Past Surgical History:  Procedure Laterality Date   BRONCHIAL BIOPSY  02/09/2024   Procedure: BRONCHOSCOPY, WITH BIOPSY;  Surgeon: Denson Flake, MD;  Location: [facility] ENDOSCOPY;  Service: Pulmonary;;   BRONCHIAL BRUSHINGS  02/09/2024   Procedure: BRONCHOSCOPY, WITH BRUSH BIOPSY;  Surgeon: Denson Flake, MD;  Location: [facility] ENDOSCOPY;  Service: Pulmonary;;   BRONCHIAL NEEDLE ASPIRATION BIOPSY  02/09/2024   Procedure: BRONCHOSCOPY, WITH NEEDLE ASPIRATION BIOPSY;  Surgeon: Denson Flake, MD;   Location: [facility] ENDOSCOPY;  Service: Pulmonary;;   BRONCHIAL WASHINGS  02/09/2024   Procedure: IRRIGATION, BRONCHUS;  Surgeon: Denson Flake, MD;  Location: [facility] ENDOSCOPY;  Service: Pulmonary;;   CARDIOVASCULAR STRESS TEST  01-06-2013  dr Ardell Koller   normal perfusion study/  no ischemia or infarct/  normal LV function and wall motion , ef 57%   CARDIOVERSION N/A 08/05/2016   Procedure: CARDIOVERSION;  Surgeon: Luana Rumple, MD;  Location: [facility] ENDOSCOPY;  Service: Cardiovascular;  Laterality: N/A;   CARDIOVERSION N/A 08/08/2016   Procedure: CARDIOVERSION;  Surgeon: Hugh Madura, MD;  Location: [facility] ENDOSCOPY;  Service: Cardiovascular;  Laterality: N/A;   CORONARY ANGIOPLASTY  11-13-2005 dr Grandville Lax   Successful touch up post-dilatation of the 3 tandem overlying Cypher stents in proximal to mid LAD (  based on IVUS 0% stenosis and patent diagonal branch)/  Cutting Balloon Angioplasty of Ramus branch   CORONARY ANGIOPLASTY WITH STENT PLACEMENT  11-05-2005   dr Therese Flash brodie   PCI and DES x3 to birfurcation lesion LAD and diagonal branch of LAD/   20% dLM,  30-40% RCA, ef 60%   CYSTOSCOPY WITH RETROGRADE PYELOGRAM, URETEROSCOPY AND STENT PLACEMENT Right 07/14/2015   Procedure: CYSTOSCOPY WITH RETROGRADE PYELOGRAM, URETEROSCOPY AND STENT PLACEMENT;  Surgeon: Osborn Blaze, MD;  Location: [facility];  Service: Urology;  Laterality: Right;   ELECTROPHYSIOLOGIC STUDY N/A 10/24/2016   Procedure: Atrial Fibrillation Ablation;  Surgeon: Will Cortland Ding, MD;  Location: [facility] INVASIVE CV LAB;  Service: Cardiovascular;  Laterality: N/A;   EXTRACORPOREAL SHOCK WAVE LITHOTRIPSY  yrs ago   HEMORRHOID SURGERY N/A 10/24/2017   Procedure: HEMORRHOIDECTOMY;  Surgeon: Melvenia Stabs, MD;  Location: [facility] OR;  Service: General;  Laterality: N/A;   HERNIA REPAIR Bilateral 2000   Inguinal Hernia   IR GENERIC HISTORICAL  10/23/2016   IR FLUORO GUIDE CV LINE RIGHT 10/23/2016 Lucinda Saber, MD [facility]-INTER[facility] RAD   IR  GENERIC HISTORICAL  10/23/2016   IR US  GUIDE VASC ACCESS RIGHT 10/23/2016 Lucinda Saber, MD [facility]   LEFT URETEROSCOPIC STONE EXTRACTION  04/10/2006   STONE EXTRACTION WITH BASKET Right 07/14/2015   Procedure: STONE EXTRACTION WITH BASKET;  Surgeon: Osborn Blaze, MD;  Location: [facility];  Service: Urology;  Laterality: Right;   TEE WITHOUT CARDIOVERSION N/A 08/05/2016   Procedure: TRANSESOPHAGEAL ECHOCARDIOGRAM (TEE);  Surgeon: Luana Rumple, MD;  Location: [facility] ENDOSCOPY;  Service: Cardiovascular;  Laterality: N/A;   TOTAL HIP ARTHROPLASTY Right 07/16/2022   Procedure: RIGHT TOTAL HIP ARTHROPLASTY ANTERIOR APPROACH;  Surgeon: Wes Hamman, MD;  Location: [facility] OR;  Service: Orthopedics;  Laterality: Right;  3-C   TRANSTHORACIC ECHOCARDIOGRAM  02/04/2012   grade I diastolic dysfunction/  ef 55-60%/  trivial MR and TR/  mild LAE   VIDEO BRONCHOSCOPY WITH ENDOBRONCHIAL NAVIGATION Right 02/09/2024   Procedure: VIDEO BRONCHOSCOPY WITH ENDOBRONCHIAL NAVIGATION;  Surgeon: Denson Flake, MD;  Location: [facility] ENDOSCOPY;  Service: Pulmonary;  Laterality: Right;  WITH FLOURO    Social History   Socioeconomic History   Marital status: Married    Spouse name: Not on file   Number of children: Not on file   Years of education: Not on file   Highest education level: Not on file  Occupational History   Not on file  Tobacco Use   Smoking status: Never    Passive exposure: Past   Smokeless tobacco: Never  Vaping Use   Vaping status: Never Used  Substance and Sexual Activity   Alcohol  use: No    Comment: 1 beer a year   Drug use: No   Sexual activity: Not on file  Other Topics Concern   Not on file  Social History Narrative   Not on file   Social Drivers of Health   Financial Resource Strain: Not on file  Food Insecurity: No Food Insecurity (02/19/2024)   Hunger Vital Sign    Worried About Running Out of Food in the Last Year: Never true    Ran Out of Food in the Last  Year: Never true  Transportation Needs: No Transportation Needs (02/19/2024)   PRAPARE - Administrator, Civil Service (Medical): No    Lack of Transportation (Non-Medical): No  Physical Activity: Not on file  Stress: Not on file  Social Connections:  Moderately Integrated (01/23/2024)   Social Connection and Isolation Panel [NHANES]    Frequency of Communication with Friends and Family: Once a week    Frequency of Social Gatherings with Friends and Family: More than three times a week    Attends Religious Services: 1 to 4 times per year    Active Member of [employer] or Organizations: No    Attends Banker Meetings: Never    Marital Status: Married  Catering manager Violence: Not At Risk (02/19/2024)   Humiliation, Afraid, Rape, and Kick questionnaire    Fear of Current or Ex-Partner: No    Emotionally Abused: No    Physically Abused: No    Sexually Abused: No    Family History  Problem Relation Age of Onset   Heart attack Father 13   Congestive Heart Failure Father 41   Cancer Paternal Uncle    Cancer Other      Current Outpatient Medications:    [START ON 04/07/2024] predniSONE  (DELTASONE ) 20 MG tablet, Take 1 tablet (20 mg total) by mouth 2 (two) times daily with a meal for 5 days., Disp: 10 tablet, Rfl: 0   albuterol  (VENTOLIN  HFA) 108 (90 Base) MCG/ACT inhaler, Inhale 2 puffs into the lungs every 6 (six) hours as needed for wheezing or shortness of breath (intractable cough)., Disp: 8 g, Rfl: 2   allopurinol  (ZYLOPRIM ) 100 MG tablet, Take 100 mg by mouth at bedtime., Disp: , Rfl:    allopurinol  (ZYLOPRIM ) 300 MG tablet, Take 300 mg by mouth in the morning., Disp: , Rfl:    atorvastatin  (LIPITOR) 40 MG tablet, TAKE 1 TABLET EVERY DAY, Disp: 30 tablet, Rfl: 0   benzonatate  (TESSALON  PERLES) 100 MG capsule, Take 2 capsules (200 mg total) by mouth 2 (two) times daily as needed for cough., Disp: 30 capsule, Rfl: 1   budeson-glycopyrrolate-formoterol (BREZTRI   AEROSPHERE) 160-9-4.8 MCG/ACT AERO, Inhale 2 puffs into the lungs in the morning and at bedtime., Disp: , Rfl:    carvedilol  (COREG ) 25 MG tablet, TAKE 1 TABLET TWICE DAILY WITH MEALS, Disp: 180 tablet, Rfl: 3   Cyanocobalamin  (VITAMIN B-12 PO), Take 1 tablet by mouth daily., Disp: , Rfl:    diphenhydramine -acetaminophen  (TYLENOL  PM) 25-500 MG TABS tablet, Take 1 tablet by mouth at bedtime., Disp: , Rfl:    ferrous sulfate  325 (65 FE) MG tablet, Take 1 tablet (325 mg total) by mouth every morning., Disp: 90 tablet, Rfl: 2   folic acid  (FOLVITE ) 1 MG tablet, Take 1 tablet (1 mg total) by mouth daily. Start 7 days before pemetrexed  chemotherapy. Continue until 21 days after pemetrexed  completed., Disp: 100 tablet, Rfl: 3   furosemide  (LASIX ) 40 MG tablet, TAKE 1 TABLET EVERY DAY, Disp: 90 tablet, Rfl: 3   hydrocortisone  1 % lotion, Apply 1 Application topically 2 (two) times daily., Disp: 118 mL, Rfl: 0   lidocaine -prilocaine  (EMLA ) cream, Apply to affected area once, Disp: 30 g, Rfl: 3   lisinopril  (ZESTRIL ) 40 MG tablet, TAKE 1 TABLET EVERY DAY, Disp: 90 tablet, Rfl: 3   nitroGLYCERIN  (NITROSTAT ) 0.4 MG SL tablet, Place 1 tablet (0.4 mg total) under the tongue every 5 (five) minutes as needed for chest pain. X 3 doses, Disp: 25 tablet, Rfl: 5   ondansetron  (ZOFRAN ) 8 MG tablet, Take 1 tablet (8 mg total) by mouth every 8 (eight) hours as needed for nausea or vomiting. Start on the third day after carboplatin ., Disp: 30 tablet, Rfl: 1   polyvinyl alcohol  (LIQUIFILM TEARS) 1.4 %  ophthalmic solution, Place 1 drop into both eyes daily as needed for dry eyes., Disp: , Rfl:    prochlorperazine  (COMPAZINE ) 10 MG tablet, Take 1 tablet (10 mg total) by mouth every 6 (six) hours as needed for nausea or vomiting., Disp: 30 tablet, Rfl: 1   rivaroxaban  (XARELTO ) 20 MG TABS tablet, Take 1 tablet (20 mg total) by mouth at bedtime. Okay to restart this medication on 02/10/24, Disp: , Rfl:    sertraline  (ZOLOFT ) 25 MG  tablet, Take 25 mg by mouth daily., Disp: , Rfl:   PHYSICAL EXAM ECOG FS:1 - Symptomatic but completely ambulatory    Vitals:   04/06/24 1457  BP: 122/70  Pulse: (!) 101  Resp: 18  Temp: 98.3 F (36.8 C)  TempSrc: Temporal  SpO2: 98%  Weight: 217 lb 14.4 oz (98.8 kg)  Height: 5\' 6"  (1.676 m)   Physical Exam Vitals and nursing note reviewed.  Constitutional:      Appearance: He is not ill-appearing or toxic-appearing.  HENT:     Head: Normocephalic.     Mouth/Throat:     Mouth: Mucous membranes are moist.     Pharynx: Oropharynx is clear.  Eyes:     Conjunctiva/sclera: Conjunctivae normal.  Cardiovascular:     Rate and Rhythm: Normal rate and regular rhythm.     Pulses: Normal pulses.     Heart sounds: Normal heart sounds.     Comments: Controlled HR in the 90s Pulmonary:     Effort: Pulmonary effort is normal.     Breath sounds: Normal breath sounds.  Abdominal:     General: There is no distension.  Musculoskeletal:     Cervical back: Normal range of motion.  Skin:    General: Skin is warm and dry.     Findings: Rash present.     Comments: Please see media below  Rash on head, torso, upper extremities  Neurological:     Mental Status: He is alert.             LABORATORY DATA I have reviewed the data as listed    Latest Ref Rng & Units 04/06/2024    2:27 PM 04/01/2024    2:44 PM 03/23/2024   12:31 PM  CBC  WBC 4.0 - 10.5 K/uL 1.2  4.2  8.3   Hemoglobin 13.0 - 17.0 g/dL 16.1  09.6  04.5   Hematocrit 39.0 - 52.0 % 31.2  36.0  37.9   Platelets 150 - 400 K/uL 22  77  152         Latest Ref Rng & Units 04/06/2024    2:27 PM 04/01/2024    2:44 PM 03/23/2024   12:31 PM  CMP  Glucose 70 - 99 mg/dL 409  811  914   BUN 8 - 23 mg/dL 24  61  22   Creatinine 0.61 - 1.24 mg/dL 7.82  9.56  2.13   Sodium 135 - 145 mmol/L 139  139  140   Potassium 3.5 - 5.1 mmol/L 4.5  5.0  4.2   Chloride 98 - 111 mmol/L 108  108  106   CO2 22 - 32 mmol/L 23  24  27     Calcium  8.9 - 10.3 mg/dL 9.2  9.6  9.5   Total Protein 6.5 - 8.1 g/dL 6.9  7.0  7.1   Total Bilirubin 0.0 - 1.2 mg/dL 0.6  0.6  0.6   Alkaline Phos 38 - 126 U/L 100  98  95  AST 15 - 41 U/L 19  24  21    ALT 0 - 44 U/L 20  22  18         RADIOGRAPHIC STUDIES (from last 24 hours if applicable) I have personally reviewed the radiological images as listed and agreed with the findings in the report. No results found.      Visit Diagnosis: 1. Change of skin related to chemotherapy   2. Antineoplastic chemotherapy induced pancytopenia (HCC)   3. Malignant neoplasm of bronchus of right lower lobe (HCC)      No orders of the defined types were placed in this encounter.   All questions were answered. The patient knows to call the clinic with any problems, questions or concerns. No barriers to learning was detected.  A total of more than 30 minutes were spent on this encounter with face-to-face time and non-face-to-face time, including preparing to see the patient, ordering tests and/or medications, counseling the patient and coordination of care as outlined above.    Thank you for allowing me to participate in the care of this patient.    Ignatz Deis E  Walisiewicz, PA-C Department of Hematology/Oncology [facility] at [facility] Phone: [phone]  [phone]    04/06/2024 4:05 PM

## 2024-04-07 ENCOUNTER — Other Ambulatory Visit: Payer: Self-pay | Admitting: Cardiology

## 2024-04-08 ENCOUNTER — Emergency Department (HOSPITAL_COMMUNITY)

## 2024-04-08 ENCOUNTER — Other Ambulatory Visit: Payer: Self-pay

## 2024-04-08 ENCOUNTER — Telehealth: Payer: Self-pay

## 2024-04-08 ENCOUNTER — Emergency Department (HOSPITAL_COMMUNITY)
Admission: EM | Admit: 2024-04-08 | Discharge: 2024-04-08 | Disposition: A | Discharge status: Home / Self Care | Attending: Emergency Medicine | Admitting: Emergency Medicine

## 2024-04-08 ENCOUNTER — Encounter (HOSPITAL_COMMUNITY): Payer: Self-pay | Admitting: Emergency Medicine

## 2024-04-08 DIAGNOSIS — Z7722 Contact with and (suspected) exposure to environmental tobacco smoke (acute) (chronic): Secondary | ICD-10-CM | POA: Diagnosis not present

## 2024-04-08 DIAGNOSIS — K8020 Calculus of gallbladder without cholecystitis without obstruction: Secondary | ICD-10-CM | POA: Diagnosis not present

## 2024-04-08 DIAGNOSIS — R0602 Shortness of breath: Secondary | ICD-10-CM | POA: Diagnosis not present

## 2024-04-08 DIAGNOSIS — I5043 Acute on chronic combined systolic (congestive) and diastolic (congestive) heart failure: Secondary | ICD-10-CM | POA: Insufficient documentation

## 2024-04-08 DIAGNOSIS — R0989 Other specified symptoms and signs involving the circulatory and respiratory systems: Secondary | ICD-10-CM | POA: Diagnosis not present

## 2024-04-08 DIAGNOSIS — Z955 Presence of coronary angioplasty implant and graft: Secondary | ICD-10-CM | POA: Insufficient documentation

## 2024-04-08 DIAGNOSIS — J984 Other disorders of lung: Secondary | ICD-10-CM | POA: Diagnosis not present

## 2024-04-08 DIAGNOSIS — I2510 Atherosclerotic heart disease of native coronary artery without angina pectoris: Secondary | ICD-10-CM | POA: Insufficient documentation

## 2024-04-08 DIAGNOSIS — R590 Localized enlarged lymph nodes: Secondary | ICD-10-CM | POA: Diagnosis not present

## 2024-04-08 DIAGNOSIS — I10 Essential (primary) hypertension: Secondary | ICD-10-CM | POA: Diagnosis not present

## 2024-04-08 DIAGNOSIS — D696 Thrombocytopenia, unspecified: Secondary | ICD-10-CM | POA: Diagnosis not present

## 2024-04-08 DIAGNOSIS — Z79899 Other long term (current) drug therapy: Secondary | ICD-10-CM | POA: Insufficient documentation

## 2024-04-08 DIAGNOSIS — R531 Weakness: Secondary | ICD-10-CM | POA: Diagnosis present

## 2024-04-08 DIAGNOSIS — N183 Chronic kidney disease, stage 3 unspecified: Secondary | ICD-10-CM | POA: Diagnosis not present

## 2024-04-08 DIAGNOSIS — C3490 Malignant neoplasm of unspecified part of unspecified bronchus or lung: Secondary | ICD-10-CM | POA: Diagnosis not present

## 2024-04-08 DIAGNOSIS — Z7901 Long term (current) use of anticoagulants: Secondary | ICD-10-CM | POA: Diagnosis not present

## 2024-04-08 DIAGNOSIS — I130 Hypertensive heart and chronic kidney disease with heart failure and stage 1 through stage 4 chronic kidney disease, or unspecified chronic kidney disease: Secondary | ICD-10-CM | POA: Insufficient documentation

## 2024-04-08 DIAGNOSIS — K219 Gastro-esophageal reflux disease without esophagitis: Secondary | ICD-10-CM | POA: Diagnosis not present

## 2024-04-08 DIAGNOSIS — R918 Other nonspecific abnormal finding of lung field: Secondary | ICD-10-CM | POA: Diagnosis not present

## 2024-04-08 LAB — CBC WITH DIFFERENTIAL/PLATELET
Abs Immature Granulocytes: 0.08 10*3/uL — ABNORMAL HIGH (ref 0.00–0.07)
Basophils Absolute: 0 10*3/uL (ref 0.0–0.1)
Basophils Relative: 0 %
Eosinophils Absolute: 0 10*3/uL (ref 0.0–0.5)
Eosinophils Relative: 0 %
HCT: 30.4 % — ABNORMAL LOW (ref 39.0–52.0)
Hemoglobin: 10.5 g/dL — ABNORMAL LOW (ref 13.0–17.0)
Immature Granulocytes: 11 %
Lymphocytes Relative: 40 %
Lymphs Abs: 0.3 10*3/uL — ABNORMAL LOW (ref 0.7–4.0)
MCH: 33.8 pg (ref 26.0–34.0)
MCHC: 34.5 g/dL (ref 30.0–36.0)
MCV: 97.7 fL (ref 80.0–100.0)
Monocytes Absolute: 0.1 10*3/uL (ref 0.1–1.0)
Monocytes Relative: 16 %
Neutro Abs: 0.2 10*3/uL — CL (ref 1.7–7.7)
Neutrophils Relative %: 33 %
Platelets: 54 10*3/uL — ABNORMAL LOW (ref 150–400)
RBC: 3.11 MIL/uL — ABNORMAL LOW (ref 4.22–5.81)
RDW: 12.7 % (ref 11.5–15.5)
WBC: 0.7 10*3/uL — CL (ref 4.0–10.5)
nRBC: 2.7 % — ABNORMAL HIGH (ref 0.0–0.2)

## 2024-04-08 LAB — BASIC METABOLIC PANEL WITH GFR
Anion gap: 11 (ref 5–15)
BUN: 27 mg/dL — ABNORMAL HIGH (ref 8–23)
CO2: 21 mmol/L — ABNORMAL LOW (ref 22–32)
Calcium: 9.2 mg/dL (ref 8.9–10.3)
Chloride: 102 mmol/L (ref 98–111)
Creatinine, Ser: 1.38 mg/dL — ABNORMAL HIGH (ref 0.61–1.24)
GFR, Estimated: 52 mL/min — ABNORMAL LOW (ref >60)
Glucose, Bld: 279 mg/dL — ABNORMAL HIGH (ref 70–99)
Potassium: 4.5 mmol/L (ref 3.5–5.1)
Sodium: 134 mmol/L — ABNORMAL LOW (ref 135–145)

## 2024-04-08 MED ORDER — IOHEXOL 350 MG/ML SOLN
75.0000 mL | Freq: Once | INTRAVENOUS | Status: AC | PRN
  Administered 2024-04-08: 75 mL via INTRAVENOUS

## 2024-04-08 NOTE — ED Provider Notes (Signed)
 [place] EMERGENCY DEPARTMENT AT [facility] Provider Note  CSN: [id number] Arrival date & time: 04/08/24 1456  Chief Complaint(s) Weakness and Abnormal Lab  HPI Peter [name] is a 78 y.o. male This is a 78 year old male with a past history of of non-small cell lung cancer, adenocarcinoma currently receiving chemotherapy who is here today for shortness of breath as well as being told that his platelets were low.  Patient last received chemotherapy on 5/13.  States that he been feeling fine yesterday, but today when he was ambulating felt very short of breath.  He measured his oxygen at home and it was reportedly 87.  Patient had been experiencing low platelets over the last 1 week.  His oncologist had started him on prednisone .  Patient denies any fever or chills.   Past Medical History Past Medical History:  Diagnosis Date   Arthritis    At risk for sleep apnea    STOP-BANG= 5       SENT TO PCP 07-12-2015   CAD (coronary artery disease)    a. s/p PCI with DES x 3 to LAD in 2006 with bifurcation lesion and Plavix  indefinitely was recommended.   Chronic combined systolic and diastolic CHF (congestive heart failure) (HCC)    a. Prior EF normal but EF dropped to 30-35% in 07/2016 in setting of AFIB.   CKD (chronic kidney disease), stage III (HCC)    Depression    Dyslipidemia    Dyspnea    tired quick d/t Afib   Dyspnea on exertion    Dysrhythmia    A.FIB   GERD (gastroesophageal reflux disease)    History of GI bleed    History of kidney stones    History of MI (myocardial infarction)    11-05-2005--  periprocedural MI  (cardiac cath) per dr Grandville Lax note   Hypertension    Hypertriglyceridemia    Iron deficiency anemia    Myocardial infarction [facility])    Persistent atrial fibrillation (HCC)    a. 07/2016, started on Eliquis  -> readmitted later that month for AF RVR/acute HF - initially unsuccessful DCCV following TEE, so loaded with amio with repeat successful DCCV  07/2816.   Right ureteral stone    S/P drug eluting coronary stent placement    x3  to LAD  2006   Sleep apnea    wears BIPAP, 14 up to 24   Patient Active Problem List   Diagnosis Date Noted   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) 03/16/2024   Abnormal CT of the chest 02/09/2024   CKD (chronic kidney disease) stage 3, GFR 30-59 ml/min (HCC) 01/23/2024   Gout 01/23/2024   CHF (congestive heart failure) (HCC) 01/23/2024   Hypertensive urgency 01/22/2024   Hyperglycemia 01/22/2024   Acute respiratory failure with hypoxia (HCC) 12/20/2023   Influenza A 12/19/2023   Sepsis (HCC) 12/19/2023   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   Degenerative joint disease (DJD) of hip 07/16/2022   Arthritis of carpometacarpal (CMC) joint of right thumb 04/16/2022   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   Chronic right shoulder pain 03/07/2022   Pain of right hand 03/07/2022   Diverticulitis 02/28/2018   RLQ abdominal pain    Sigmoid diverticulitis    Acute blood loss anemia 11/13/2017   Anticoagulation adequate 11/12/2016   Epistaxis, recurrent 11/12/2016   AF (atrial fibrillation) (HCC) 10/24/2016   Obesity 08/09/2016   Atrial fibrillation with RVR (HCC) 08/03/2016  New onset a-fib (HCC) 07/16/2016   Persistent atrial fibrillation (HCC) 07/16/2016   Acute systolic (congestive) heart failure (HCC)    Ejection fraction    Iron deficiency anemia due to chronic blood loss 02/04/2012   Anemia 02/03/2012   CKD (chronic kidney disease), stage III (HCC) 02/03/2012   DOE (dyspnea on exertion) 01/28/2012   Hypertension    Coronary artery disease due to lipid rich plaque    Dyslipidemia    GERD (gastroesophageal reflux disease)    Home Medication(s) Prior to Admission medications   Medication Sig Start Date End Date Taking? Authorizing Provider  albuterol  (VENTOLIN  HFA) 108 (90 Base) MCG/ACT inhaler Inhale 2 puffs into the lungs every 6 (six) hours as  needed for wheezing or shortness of breath (intractable cough). 12/25/23   Vada Garibaldi, MD  allopurinol  (ZYLOPRIM ) 100 MG tablet Take 100 mg by mouth at bedtime. 11/19/21   [provider]  allopurinol  (ZYLOPRIM ) 300 MG tablet Take 300 mg by mouth in the morning.    [provider]  atorvastatin  (LIPITOR) 40 MG tablet TAKE 1 TABLET EVERY DAY (NEED MD APPOINTMENT) 04/07/24   Camnitz, Babetta Lesch, MD  benzonatate  (TESSALON  PERLES) 100 MG capsule Take 2 capsules (200 mg total) by mouth 2 (two) times daily as needed for cough. 03/08/24 03/08/25  Raejean Bullock, NP  budeson-glycopyrrolate-formoterol (BREZTRI  AEROSPHERE) 160-9-4.8 MCG/ACT AERO Inhale 2 puffs into the lungs in the morning and at bedtime. 02/16/24   Raejean Bullock, NP  carvedilol  (COREG ) 25 MG tablet TAKE 1 TABLET TWICE DAILY WITH MEALS 04/17/23   Camnitz, Babetta Lesch, MD  Cyanocobalamin  (VITAMIN B-12 PO) Take 1 tablet by mouth daily.    [provider]  diphenhydramine -acetaminophen  (TYLENOL  PM) 25-500 MG TABS tablet Take 1 tablet by mouth at bedtime.    [provider]  ferrous sulfate  325 (65 FE) MG tablet Take 1 tablet (325 mg total) by mouth every morning. 10/27/18   Camnitz, Babetta Lesch, MD  folic acid  (FOLVITE ) 1 MG tablet Take 1 tablet (1 mg total) by mouth daily. Start 7 days before pemetrexed  chemotherapy. Continue until 21 days after pemetrexed  completed. 03/16/24   Marlene Simas, MD  furosemide  (LASIX ) 40 MG tablet TAKE 1 TABLET EVERY DAY 04/17/23   Camnitz, Babetta Lesch, MD  hydrocortisone  1 % lotion Apply 1 Application topically 2 (two) times daily. 04/01/24   Marlene Simas, MD  lidocaine -prilocaine  (EMLA ) cream Apply to affected area once 03/16/24   Marlene Simas, MD  lisinopril  (ZESTRIL ) 40 MG tablet TAKE 1 TABLET EVERY DAY 03/29/24   Camnitz, Babetta Lesch, MD  nitroGLYCERIN  (NITROSTAT ) 0.4 MG SL tablet Place 1 tablet (0.4 mg total) under the tongue every 5 (five) minutes as needed for chest pain. X  3 doses 04/09/23   Swinyer, Leilani Punter, NP  ondansetron  (ZOFRAN ) 8 MG tablet Take 1 tablet (8 mg total) by mouth every 8 (eight) hours as needed for nausea or vomiting. Start on the third day after carboplatin . 03/16/24   Marlene Simas, MD  polyvinyl alcohol  (LIQUIFILM TEARS) 1.4 % ophthalmic solution Place 1 drop into both eyes daily as needed for dry eyes.    [provider]  predniSONE  (DELTASONE ) 20 MG tablet Take 1 tablet (20 mg total) by mouth 2 (two) times daily with a meal for 5 days. 04/07/24 04/12/24  Walisiewicz, Kaitlyn E, PA-C  prochlorperazine  (COMPAZINE ) 10 MG tablet Take 1 tablet (10 mg total) by mouth every 6 (six) hours as needed for nausea or vomiting. 03/16/24  Marlene Simas, MD  rivaroxaban  (XARELTO ) 20 MG TABS tablet Take 1 tablet (20 mg total) by mouth at bedtime. Okay to restart this medication on 02/10/24 02/09/24   Byrum, Robert S, MD  sertraline  (ZOLOFT ) 25 MG tablet Take 25 mg by mouth daily. 06/17/22   [provider]                                                                                                                                    Past Surgical History Past Surgical History:  Procedure Laterality Date   BRONCHIAL BIOPSY  02/09/2024   Procedure: BRONCHOSCOPY, WITH BIOPSY;  Surgeon: Denson Flake, MD;  Location: [facility] ENDOSCOPY;  Service: Pulmonary;;   BRONCHIAL BRUSHINGS  02/09/2024   Procedure: BRONCHOSCOPY, WITH BRUSH BIOPSY;  Surgeon: Denson Flake, MD;  Location: [facility] ENDOSCOPY;  Service: Pulmonary;;   BRONCHIAL NEEDLE ASPIRATION BIOPSY  02/09/2024   Procedure: BRONCHOSCOPY, WITH NEEDLE ASPIRATION BIOPSY;  Surgeon: Denson Flake, MD;  Location: [facility] ENDOSCOPY;  Service: Pulmonary;;   BRONCHIAL WASHINGS  02/09/2024   Procedure: IRRIGATION, BRONCHUS;  Surgeon: Denson Flake, MD;  Location: [facility] ENDOSCOPY;  Service: Pulmonary;;   CARDIOVASCULAR STRESS TEST  01-06-2013  dr Ardell Koller   normal perfusion study/  no ischemia or infarct/  normal LV  function and wall motion , ef 57%   CARDIOVERSION N/A 08/05/2016   Procedure: CARDIOVERSION;  Surgeon: Luana Rumple, MD;  Location: [facility] ENDOSCOPY;  Service: Cardiovascular;  Laterality: N/A;   CARDIOVERSION N/A 08/08/2016   Procedure: CARDIOVERSION;  Surgeon: Hugh Madura, MD;  Location: [facility] ENDOSCOPY;  Service: Cardiovascular;  Laterality: N/A;   CORONARY ANGIOPLASTY  11-13-2005 dr Grandville Lax   Successful touch up post-dilatation of the 3 tandem overlying Cypher stents in proximal to mid LAD (based on IVUS 0% stenosis and patent diagonal branch)/  Cutting Balloon Angioplasty of Ramus branch   CORONARY ANGIOPLASTY WITH STENT PLACEMENT  11-05-2005   dr Therese Flash brodie   PCI and DES x3 to birfurcation lesion LAD and diagonal branch of LAD/   20% dLM,  30-40% RCA, ef 60%   CYSTOSCOPY WITH RETROGRADE PYELOGRAM, URETEROSCOPY AND STENT PLACEMENT Right 07/14/2015   Procedure: CYSTOSCOPY WITH RETROGRADE PYELOGRAM, URETEROSCOPY AND STENT PLACEMENT;  Surgeon: Osborn Blaze, MD;  Location: [facility];  Service: Urology;  Laterality: Right;   ELECTROPHYSIOLOGIC STUDY N/A 10/24/2016   Procedure: Atrial Fibrillation Ablation;  Surgeon: Will Cortland Ding, MD;  Location: [facility] INVASIVE CV LAB;  Service: Cardiovascular;  Laterality: N/A;   EXTRACORPOREAL SHOCK WAVE LITHOTRIPSY  yrs ago   HEMORRHOID SURGERY N/A 10/24/2017   Procedure: HEMORRHOIDECTOMY;  Surgeon: Melvenia Stabs, MD;  Location: [facility] OR;  Service: General;  Laterality: N/A;   HERNIA REPAIR Bilateral 2000   Inguinal Hernia   IR GENERIC HISTORICAL  10/23/2016   IR FLUORO GUIDE CV LINE RIGHT 10/23/2016 Lucinda Saber, MD [facility]-INTER[facility]   IR GENERIC HISTORICAL  10/23/2016  IR US  GUIDE VASC ACCESS RIGHT 10/23/2016 Lucinda Saber, MD [facility]   LEFT URETEROSCOPIC STONE EXTRACTION  04/10/2006   STONE EXTRACTION WITH BASKET Right 07/14/2015   Procedure: STONE EXTRACTION WITH BASKET;  Surgeon: Osborn Blaze, MD;  Location: [facility];  Service: Urology;  Laterality: Right;   TEE WITHOUT CARDIOVERSION N/A 08/05/2016   Procedure: TRANSESOPHAGEAL ECHOCARDIOGRAM (TEE);  Surgeon: Luana Rumple, MD;  Location: [facility] ENDOSCOPY;  Service: Cardiovascular;  Laterality: N/A;   TOTAL HIP ARTHROPLASTY Right 07/16/2022   Procedure: RIGHT TOTAL HIP ARTHROPLASTY ANTERIOR APPROACH;  Surgeon: Wes Hamman, MD;  Location: [facility] OR;  Service: Orthopedics;  Laterality: Right;  3-C   TRANSTHORACIC ECHOCARDIOGRAM  02/04/2012   grade I diastolic dysfunction/  ef 55-60%/  trivial MR and TR/  mild LAE   VIDEO BRONCHOSCOPY WITH ENDOBRONCHIAL NAVIGATION Right 02/09/2024   Procedure: VIDEO BRONCHOSCOPY WITH ENDOBRONCHIAL NAVIGATION;  Surgeon: Denson Flake, MD;  Location: [facility] ENDOSCOPY;  Service: Pulmonary;  Laterality: Right;  WITH FLOURO   Family History Family History  Problem Relation Age of Onset   Heart attack Father 49   Congestive Heart Failure Father 34   Cancer Paternal Uncle    Cancer Other     Social History Social History   Tobacco Use   Smoking status: Never    Passive exposure: Past   Smokeless tobacco: Never  Vaping Use   Vaping status: Never Used  Substance Use Topics   Alcohol  use: No    Comment: 1 beer a year   Drug use: No   Allergies Prednisone  and Spironolactone   Review of Systems Review of Systems  Physical Exam Vital Signs  I have reviewed the triage vital signs BP (!) 154/100   Pulse 88   Temp 98.4 F (36.9 C) (Oral)   Resp 19   SpO2 97%   Physical Exam Vitals and nursing note reviewed.  Constitutional:      Appearance: He is not toxic-appearing.  Eyes:     Pupils: Pupils are equal, round, and reactive to light.  Cardiovascular:     Rate and Rhythm: Normal rate.  Pulmonary:     Effort: Pulmonary effort is normal.  Abdominal:     General: Abdomen is flat. There is no distension.     Palpations: Abdomen is soft.     Tenderness: There is no abdominal tenderness. There is no  guarding.  Musculoskeletal:        General: Normal range of motion.  Skin:    General: Skin is warm.  Neurological:     General: No focal deficit present.     Mental Status: He is alert.     Cranial Nerves: No cranial nerve deficit.     Motor: No weakness.     ED Results and Treatments Labs (all labs ordered are listed, but only abnormal results are displayed) Labs Reviewed  CBC WITH DIFFERENTIAL/PLATELET - Abnormal; Notable for the following components:      Result Value   WBC 0.7 (*)    RBC 3.11 (*)    Hemoglobin 10.5 (*)    HCT 30.4 (*)    Platelets 54 (*)    nRBC 2.7 (*)    All other components within normal limits  BASIC METABOLIC PANEL WITH GFR - Abnormal; Notable for the following components:   Sodium 134 (*)    CO2 21 (*)    Glucose, Bld 279 (*)    BUN 27 (*)    Creatinine, Ser 1.38 (*)  GFR, Estimated 52 (*)    All other components within normal limits                                                                                                                          Radiology CT Angio Chest PE W and/or Wo Contrast Result Date: 04/08/2024 CLINICAL DATA:  Pulmonary embolism (PE) suspected, high prob History of lung cancer.  Weakness. EXAM: CT ANGIOGRAPHY CHEST WITH CONTRAST TECHNIQUE: Multidetector CT imaging of the chest was performed using the standard protocol during bolus administration of intravenous contrast. Multiplanar CT image reconstructions and MIPs were obtained to evaluate the vascular anatomy. RADIATION DOSE REDUCTION: This exam was performed according to the departmental dose-optimization program which includes automated exposure control, adjustment of the mA and/or kV according to patient size and/or use of iterative reconstruction technique. CONTRAST:  75mL OMNIPAQUE  IOHEXOL  350 MG/ML SOLN COMPARISON:  Radiograph earlier today. PET CT 02/23/2024. Chest CTA 01/22/2024 FINDINGS: Cardiovascular: There are no filling defects within the pulmonary  arteries to suggest pulmonary embolus. Occlusion of the right upper lobe pulmonary arteries are chronic finding and unchanged from prior CTA. Mild cardiomegaly. There are coronary artery calcifications. Aortic atherosclerosis. Trace pericardial fluid. Mediastinum/Nodes: Moderate to large hiatal hernia again seen. Wall thickening of the distal esophagus, the upper esophagus is patulous. Again seen mild mediastinal adenopathy. This includes right paratracheal node measuring 14 mm. Right hilar soft tissue density which was hypermetabolic on prior PET is unchanged. Lungs/Pleura: Chronic right apical opacity with diminishing cavitary component from prior exam in contraction of the soft tissue density. Masslike opacity in the posterior right lower lobe with central cavitation has slightly contracted from prior PET. Nodular opacity in the right middle lobe, series 12 image 71, increasing. Chronic subpleural reticulation is again seen. Calcified granuloma in the right lung. No significant pleural effusion. Upper Abdomen: Moderate to large hiatal hernia. Gallstone. No acute upper abdominal findings. Musculoskeletal: Diffuse thoracic spondylosis. No evidence of focal bone lesion or acute osseous finding. Review of the MIP images confirms the above findings. IMPRESSION: 1. No pulmonary embolus. 2. Chronic right apical opacity with diminishing cavitary component and contraction of the soft tissue density. Masslike opacity in the posterior right lower lobe with central cavitation has slightly contracted from prior PET. 3. Nodular opacity in the right middle lobe, increasing from prior exam. This may be infectious/inflammatory or neoplastic. 4. Unchanged mild mediastinal adenopathy. Right hilar soft tissue density which was hypermetabolic on prior PET is unchanged. 5. Moderate to large hiatal hernia. Wall thickening of the distal esophagus, can be seen with reflux. 6. Cholelithiasis. Aortic Atherosclerosis (ICD10-I70.0).  Electronically Signed   By: Chadwick Colonel M.D.   On: 04/08/2024 20:02   DG Chest 2 View Result Date: 04/08/2024 CLINICAL DATA:  Shortness of breath EXAM: CHEST - 2 VIEW COMPARISON:  02/09/2024, 12/19/2023, chest CT 12/22/2023, PET CT 02/23/2024 FINDINGS: Low lung volumes. Masslike opacity in the right mid lung. Since prior chest radiograph, some resolution of  cavitary and consolidative process in the right apical lung. Right hilar prominence may correspond to nodes. Perihilar streaky opacities which may be due to chronic lung disease and airways thickening. No pleural effusion or pneumothorax. Stable cardiomediastinal silhouette. IMPRESSION: Low lung volumes. Masslike opacity in the right mid lung, corresponding to known malignancy. Since prior chest radiograph, some resolution of cavitary and consolidative process in the right apical lung. Chronic lung disease and possible central airways thickening Electronically Signed   By: Esmeralda Hedge M.D.   On: 04/08/2024 18:05    Pertinent labs & imaging results that were available during my care of the patient were reviewed by me and considered in my medical decision making (see MDM for details).  Medications Ordered in ED Medications  iohexol  (OMNIPAQUE ) 350 MG/ML injection 75 mL (75 mLs Intravenous Contrast Given 04/08/24 1813)                                                                                                                                     Procedures Procedures  (including critical care time)  Medical Decision Making / ED Course   This patient presents to the ED for concern of thrombocytopenia, dyspnea on exertion, this involves an extensive number of treatment options, and is a complaint that carries with it a high risk of complications and morbidity.  The differential diagnosis includes PE, thrombocytopenia, drug-induced thrombocytopenia, less likely pneumonia, less likely pulmonary edema.  MDM: With the patient's cancer  history, obtain a CTA of the patient's chest.  He is currently saturating 96% on room air.  Will plan to ambulate the patient.  Recheck in the patient's platelets here today.  He has no signs of active bleeding.  Reassessment 8:15 PM-patient CTA negative for PE.  Blood work shows platelets have improved, white count is 0.7.  Spoke with the on-call oncologist Dr. Marton Sleeper.  We discussed the patient's presentation, labs.  He believes this is likely the nadir of his expected chemotherapy process.  Appropriate for discharge.  Patient overall looks well, ambulatory without any difficulty.  Additional history obtained: -Additional history obtained from wife at bedside -External records from outside source obtained and reviewed including: Chart review including previous notes, labs, imaging, consultation notes   Lab Tests: -I ordered, reviewed, and interpreted labs.   The pertinent results include:   Labs Reviewed  CBC WITH DIFFERENTIAL/PLATELET - Abnormal; Notable for the following components:      Result Value   WBC 0.7 (*)    RBC 3.11 (*)    Hemoglobin 10.5 (*)    HCT 30.4 (*)    Platelets 54 (*)    nRBC 2.7 (*)    All other components within normal limits  BASIC METABOLIC PANEL WITH GFR - Abnormal; Notable for the following components:   Sodium 134 (*)    CO2 21 (*)    Glucose, Bld 279 (*)    BUN 27 (*)  Creatinine, Ser 1.38 (*)    GFR, Estimated 52 (*)    All other components within normal limits      EKG my independent review of the patient's EKG shows no ST segment depressions or elevations, no T wave inversions, no evidence of acute ischemia.  EKG Interpretation Date/Time:  Thursday Apr 08 2024 15:16:23 EDT Ventricular Rate:  78 PR Interval:  163 QRS Duration:  140 QT Interval:  398 QTC Calculation: 454 R Axis:   -26  Text Interpretation: Sinus rhythm Atrial premature complex Right bundle branch block Confirmed by Afton Horse [phone]) on 04/08/2024 4:20:51 PM          Imaging Studies ordered: I ordered imaging studies including CTA chest I independently visualized and interpreted imaging. I agree with the radiologist interpretation   Medicines ordered and prescription drug management: Meds ordered this encounter  Medications   iohexol  (OMNIPAQUE ) 350 MG/ML injection 75 mL    -I have reviewed the patients home medicines and have made adjustments as needed   Cardiac Monitoring: The patient was maintained on a cardiac monitor.  I personally viewed and interpreted the cardiac monitored which showed an underlying rhythm of: Normal sinus rhythm  Social Determinants of Health:  Factors impacting patients care include:    Reevaluation: After the interventions noted above, I reevaluated the patient and found that they have :improved  Co morbidities that complicate the patient evaluation  Past Medical History:  Diagnosis Date   Arthritis    At risk for sleep apnea    STOP-BANG= 5       SENT TO PCP 07-12-2015   CAD (coronary artery disease)    a. s/p PCI with DES x 3 to LAD in 2006 with bifurcation lesion and Plavix  indefinitely was recommended.   Chronic combined systolic and diastolic CHF (congestive heart failure) (HCC)    a. Prior EF normal but EF dropped to 30-35% in 07/2016 in setting of AFIB.   CKD (chronic kidney disease), stage III (HCC)    Depression    Dyslipidemia    Dyspnea    tired quick d/t Afib   Dyspnea on exertion    Dysrhythmia    A.FIB   GERD (gastroesophageal reflux disease)    History of GI bleed    History of kidney stones    History of MI (myocardial infarction)    11-05-2005--  periprocedural MI  (cardiac cath) per dr Grandville Lax note   Hypertension    Hypertriglyceridemia    Iron deficiency anemia    Myocardial infarction [facility])    Persistent atrial fibrillation (HCC)    a. 07/2016, started on Eliquis  -> readmitted later that month for AF RVR/acute HF - initially unsuccessful DCCV following TEE, so loaded with  amio with repeat successful DCCV 07/2816.   Right ureteral stone    S/P drug eluting coronary stent placement    x3  to LAD  2006   Sleep apnea    wears BIPAP, 14 up to 24      Dispostion: I considered admission for this patient, however based on his workup and discussion with his oncologist believe he is appropriate for discharge.     Final Clinical Impression(s) / ED Diagnoses Final diagnoses:  Thrombocytopenia (HCC)     @PCDICTATION @    Afton Horse T, DO 04/08/24 2019

## 2024-04-08 NOTE — Discharge Instructions (Addendum)
 Your platelets today were 57.  This is an improvement from your recent labs.  Your white blood cell count was lower today at 0.7.  It is very important that you return to the emergency room if you develop fever or cough.  Follow-up with your oncologist.  Your CT scan of your chest did not show any pulmonary embolism or blood clot.

## 2024-04-08 NOTE — ED Triage Notes (Signed)
 Pt sent over abnormal labs platelets low. States his oxygen level 87%. Patient has a history of lung cancer non-small cell stage 3B. Patient has increased weakness all week making it hard to even get up and move.  Increased shortness of breath today

## 2024-04-08 NOTE — Telephone Encounter (Signed)
 Spoke with patient this afternoon who reported having labs drawn on Tuesday with a platelet count of 22,000. Patient also reports a "chemo rash" for which steroids were prescribed. Rash on face has improved, but rash on body remains unchanged. Patient reports feeling lightheaded and very weak. Blood pressure measured at 165/95. Patient was instructed to go to the ER for evaluation. Patient voiced understanding.

## 2024-04-09 ENCOUNTER — Other Ambulatory Visit: Payer: Self-pay | Admitting: Physician Assistant

## 2024-04-09 ENCOUNTER — Telehealth: Payer: Self-pay | Admitting: Physician Assistant

## 2024-04-09 ENCOUNTER — Inpatient Hospital Stay

## 2024-04-09 ENCOUNTER — Telehealth: Payer: Self-pay

## 2024-04-09 VITALS — BP 169/83 | HR 102 | Resp 24

## 2024-04-09 DIAGNOSIS — D509 Iron deficiency anemia, unspecified: Secondary | ICD-10-CM | POA: Diagnosis not present

## 2024-04-09 DIAGNOSIS — I5042 Chronic combined systolic (congestive) and diastolic (congestive) heart failure: Secondary | ICD-10-CM | POA: Diagnosis not present

## 2024-04-09 DIAGNOSIS — Z5111 Encounter for antineoplastic chemotherapy: Secondary | ICD-10-CM | POA: Diagnosis not present

## 2024-04-09 DIAGNOSIS — N183 Chronic kidney disease, stage 3 unspecified: Secondary | ICD-10-CM | POA: Diagnosis not present

## 2024-04-09 DIAGNOSIS — C3411 Malignant neoplasm of upper lobe, right bronchus or lung: Secondary | ICD-10-CM | POA: Diagnosis not present

## 2024-04-09 DIAGNOSIS — I4891 Unspecified atrial fibrillation: Secondary | ICD-10-CM | POA: Diagnosis not present

## 2024-04-09 DIAGNOSIS — D709 Neutropenia, unspecified: Secondary | ICD-10-CM | POA: Insufficient documentation

## 2024-04-09 DIAGNOSIS — Z5112 Encounter for antineoplastic immunotherapy: Secondary | ICD-10-CM | POA: Diagnosis not present

## 2024-04-09 DIAGNOSIS — M109 Gout, unspecified: Secondary | ICD-10-CM | POA: Diagnosis not present

## 2024-04-09 DIAGNOSIS — D61810 Antineoplastic chemotherapy induced pancytopenia: Secondary | ICD-10-CM | POA: Diagnosis not present

## 2024-04-09 MED ORDER — FILGRASTIM-SNDZ 480 MCG/0.8ML IJ SOSY
480.0000 ug | PREFILLED_SYRINGE | Freq: Once | INTRAMUSCULAR | Status: AC
  Administered 2024-04-09: 480 ug via SUBCUTANEOUS
  Filled 2024-04-09: qty 0.8

## 2024-04-09 NOTE — Telephone Encounter (Addendum)
 Spoke with patient this morning to assess his condition following his ER visit last night. Patient reports feeling much better today.  Patient states "I feel so good, I'm going to get breakfast!"  Informed patient that Cassie, PA, has recommended administration of Zarxio injection due to low WBC count. Advised that the PA team is working to obtain approval for the injection so he can receive the first dose today and the second dose tomorrow. Called patient with appt times for today and tomorrow. Informed patient if injection is not approved by 2 pm today, I will call and cancel appt for today. Patient verbalized understanding.

## 2024-04-09 NOTE — Telephone Encounter (Signed)
 His zarxio injection today was approved. I called the patient to let him know to come in for his injection as scheduled. I left him a voicemail with this information.

## 2024-04-10 ENCOUNTER — Inpatient Hospital Stay

## 2024-04-10 VITALS — BP 114/76 | HR 86 | Temp 97.7°F | Resp 20

## 2024-04-10 DIAGNOSIS — I4891 Unspecified atrial fibrillation: Secondary | ICD-10-CM | POA: Diagnosis not present

## 2024-04-10 DIAGNOSIS — C3411 Malignant neoplasm of upper lobe, right bronchus or lung: Secondary | ICD-10-CM | POA: Diagnosis not present

## 2024-04-10 DIAGNOSIS — D709 Neutropenia, unspecified: Secondary | ICD-10-CM

## 2024-04-10 DIAGNOSIS — D509 Iron deficiency anemia, unspecified: Secondary | ICD-10-CM | POA: Diagnosis not present

## 2024-04-10 DIAGNOSIS — M109 Gout, unspecified: Secondary | ICD-10-CM | POA: Diagnosis not present

## 2024-04-10 DIAGNOSIS — Z5111 Encounter for antineoplastic chemotherapy: Secondary | ICD-10-CM | POA: Diagnosis not present

## 2024-04-10 DIAGNOSIS — I5042 Chronic combined systolic (congestive) and diastolic (congestive) heart failure: Secondary | ICD-10-CM | POA: Diagnosis not present

## 2024-04-10 DIAGNOSIS — D61810 Antineoplastic chemotherapy induced pancytopenia: Secondary | ICD-10-CM | POA: Diagnosis not present

## 2024-04-10 DIAGNOSIS — N183 Chronic kidney disease, stage 3 unspecified: Secondary | ICD-10-CM | POA: Diagnosis not present

## 2024-04-10 DIAGNOSIS — Z5112 Encounter for antineoplastic immunotherapy: Secondary | ICD-10-CM | POA: Diagnosis not present

## 2024-04-10 MED ORDER — FILGRASTIM-SNDZ 480 MCG/0.8ML IJ SOSY
480.0000 ug | PREFILLED_SYRINGE | Freq: Once | INTRAMUSCULAR | Status: AC
  Administered 2024-04-10: 480 ug via SUBCUTANEOUS

## 2024-04-10 NOTE — Progress Notes (Unsigned)
 [facility] OFFICE PROGRESS NOTE  Koirala, [name], MD [address] [place] [address]  DIAGNOSIS:  Stage IIIA (T4, N1, M0) non-small cell lung cancer, adenocarcinoma presented with right lower lobe lung mass in addition to suspicious right upper lobe lesion and right hilar lymphadenopathy diagnosed in April 2025.    Biomarker Findings HRD signature - Cannot Be Determined Microsatellite status - Cannot Be Determined ? Tumor Mutational Burden - Cannot Be Determined Genomic Findings For a complete list of the genes assayed, please refer to the Appendix. AKT2 amplification CDKN2A p16INK4a R80* and p14ARF P94L KRAS G12D ASXL1 W796fs*22 GNAS R201H U2AF1 S34F 7 Disease relevant genes with no reportable alterations: ALK, BRAF, EGFR, ERBB2, MET, RET, ROS1   PDL1 TPS: 0%  PRIOR THERAPY: None   CURRENT THERAPY: Neoadjuvant systemic chemotherapy with carboplatin  for AUC of 5, Alimta 500 Mg/M2 and Keytruda  200 Mg IV every 3 weeks. Status post 1 cycle. Starting from cycle #2, we will hold his Keytruda  and reduce the dose of carboplatin  to an AUC 4 and Alimta at 400 mg/m.  INTERVAL HISTORY: Peter [name] 78 y.o. male returns to the clinic today for a follow-up visit accompanied by his wife.  In summary the patient was recently diagnosed with lung cancer for which she started neoadjuvant chemotherapy.  He developed significant cytopenias after cycle #1 which required G-CSF injections.  He also had significant thrombocytopenia for which he was placed on steroids. He did not require a platelet transfusion.  He had a history of for glycemia with steroids back in March 2025.  Therefore he was placed on 9 mg of prednisone  twice daily for a few days.  He developed significant rash with cycle #1 which is also why he was placed on steroids.  The rash on his face improved but he still has rash on his body.  The rash is dry appearing and sometimes causes soreness.   Particularly if water  runs on it.  He denies any significant pruritus.  When the patient had thrombocytopenia, he mentions a morning he woke up and had bloody appearing discharge near his eye.  To his chemotherapy, the patient was active at home.  However since starting treatment the patient had more fatigue and generalized weakness.  He experiences significant shortness of breath, which has worsened since his last treatment. His oxygen saturation drops to 87-86% with minimal exertion, such as walking to the kitchen. He uses a CPAP machine, which temporarily increases his oxygen levels. He has a history of pulmonary issues and is under the care of a pulmonologist. Previous interventions, including a biopsy and a breathing test, have been conducted. He was prescribed Breztri  for two weeks, which did not alleviate his symptoms.  Patient denies any significant cough.  He received antibiotics a few weeks ago which did help a lingering cough.  He denies any hemoptysis or chest pain.  No fever, chills, night sweats, nausea, vomiting, diarrhea, or constipation. His appetite is fair, and he has lost ten pounds, which he attributes to dieting. He notes that his blood pressure is lower than usual today, possibly due to recent medication intake and he took his lisinopril  this morning.  He is here today for evaluation repeat blood work before undergoing cycle #2   MEDICAL HISTORY: Past Medical History:  Diagnosis Date   Arthritis    At risk for sleep apnea    STOP-BANG= 5       SENT TO PCP 07-12-2015   CAD (  coronary artery disease)    a. s/p PCI with DES x 3 to LAD in 2006 with bifurcation lesion and Plavix  indefinitely was recommended.   Chronic combined systolic and diastolic CHF (congestive heart failure) (HCC)    a. Prior EF normal but EF dropped to 30-35% in 07/2016 in setting of AFIB.   CKD (chronic kidney disease), stage III (HCC)    Depression    Dyslipidemia    Dyspnea    tired quick d/t Afib    Dyspnea on exertion    Dysrhythmia    A.FIB   GERD (gastroesophageal reflux disease)    History of GI bleed    History of kidney stones    History of MI (myocardial infarction)    11-05-2005--  periprocedural MI  (cardiac cath) per dr Grandville Lax note   Hypertension    Hypertriglyceridemia    Iron deficiency anemia    Myocardial infarction [facility])    Persistent atrial fibrillation (HCC)    a. 07/2016, started on Eliquis  -> readmitted later that month for AF RVR/acute HF - initially unsuccessful DCCV following TEE, so loaded with amio with repeat successful DCCV 07/2816.   Right ureteral stone    S/P drug eluting coronary stent placement    x3  to LAD  2006   Sleep apnea    wears BIPAP, 14 up to 24    ALLERGIES:  is allergic to prednisone  and spironolactone .  MEDICATIONS:  Current Outpatient Medications  Medication Sig Dispense Refill   albuterol  (VENTOLIN  HFA) 108 (90 Base) MCG/ACT inhaler Inhale 2 puffs into the lungs every 6 (six) hours as needed for wheezing or shortness of breath (intractable cough). 8 g 2   allopurinol  (ZYLOPRIM ) 100 MG tablet Take 100 mg by mouth at bedtime.     allopurinol  (ZYLOPRIM ) 300 MG tablet Take 300 mg by mouth in the morning.     atorvastatin  (LIPITOR) 40 MG tablet TAKE 1 TABLET EVERY DAY (NEED MD APPOINTMENT) 90 tablet 0   benzonatate  (TESSALON  PERLES) 100 MG capsule Take 2 capsules (200 mg total) by mouth 2 (two) times daily as needed for cough. 30 capsule 1   budeson-glycopyrrolate-formoterol (BREZTRI  AEROSPHERE) 160-9-4.8 MCG/ACT AERO Inhale 2 puffs into the lungs in the morning and at bedtime.     carvedilol  (COREG ) 25 MG tablet TAKE 1 TABLET TWICE DAILY WITH MEALS 180 tablet 3   Cyanocobalamin  (VITAMIN B-12 PO) Take 1 tablet by mouth daily.     diphenhydramine -acetaminophen  (TYLENOL  PM) 25-500 MG TABS tablet Take 1 tablet by mouth at bedtime.     ferrous sulfate  325 (65 FE) MG tablet Take 1 tablet (325 mg total) by mouth every morning. 90 tablet 2    folic acid  (FOLVITE ) 1 MG tablet Take 1 tablet (1 mg total) by mouth daily. Start 7 days before pemetrexed  chemotherapy. Continue until 21 days after pemetrexed  completed. 100 tablet 3   furosemide  (LASIX ) 40 MG tablet TAKE 1 TABLET EVERY DAY 90 tablet 3   hydrocortisone  1 % lotion Apply 1 Application topically 2 (two) times daily. 118 mL 0   lidocaine -prilocaine  (EMLA ) cream Apply to affected area once 30 g 3   lisinopril  (ZESTRIL ) 40 MG tablet TAKE 1 TABLET EVERY DAY 90 tablet 3   nitroGLYCERIN  (NITROSTAT ) 0.4 MG SL tablet Place 1 tablet (0.4 mg total) under the tongue every 5 (five) minutes as needed for chest pain. X 3 doses 25 tablet 5   ondansetron  (ZOFRAN ) 8 MG tablet Take 1 tablet (8 mg total) by mouth  every 8 (eight) hours as needed for nausea or vomiting. Start on the third day after carboplatin . 30 tablet 1   polyvinyl alcohol  (LIQUIFILM TEARS) 1.4 % ophthalmic solution Place 1 drop into both eyes daily as needed for dry eyes.     predniSONE  (DELTASONE ) 20 MG tablet Take 1 tablet (20 mg total) by mouth 2 (two) times daily with a meal for 5 days. 10 tablet 0   prochlorperazine  (COMPAZINE ) 10 MG tablet Take 1 tablet (10 mg total) by mouth every 6 (six) hours as needed for nausea or vomiting. 30 tablet 1   rivaroxaban  (XARELTO ) 20 MG TABS tablet Take 1 tablet (20 mg total) by mouth at bedtime. Okay to restart this medication on 02/10/24     sertraline  (ZOLOFT ) 25 MG tablet Take 25 mg by mouth daily.     No current facility-administered medications for this visit.    SURGICAL HISTORY:  Past Surgical History:  Procedure Laterality Date   BRONCHIAL BIOPSY  02/09/2024   Procedure: BRONCHOSCOPY, WITH BIOPSY;  Surgeon: Denson Flake, MD;  Location: [facility] ENDOSCOPY;  Service: Pulmonary;;   BRONCHIAL BRUSHINGS  02/09/2024   Procedure: BRONCHOSCOPY, WITH BRUSH BIOPSY;  Surgeon: Denson Flake, MD;  Location: [facility] ENDOSCOPY;  Service: Pulmonary;;   BRONCHIAL NEEDLE ASPIRATION BIOPSY  02/09/2024    Procedure: BRONCHOSCOPY, WITH NEEDLE ASPIRATION BIOPSY;  Surgeon: Denson Flake, MD;  Location: [facility] ENDOSCOPY;  Service: Pulmonary;;   BRONCHIAL WASHINGS  02/09/2024   Procedure: IRRIGATION, BRONCHUS;  Surgeon: Denson Flake, MD;  Location: [facility] ENDOSCOPY;  Service: Pulmonary;;   CARDIOVASCULAR STRESS TEST  01-06-2013  dr Ardell Koller   normal perfusion study/  no ischemia or infarct/  normal LV function and wall motion , ef 57%   CARDIOVERSION N/A 08/05/2016   Procedure: CARDIOVERSION;  Surgeon: Luana Rumple, MD;  Location: [facility] ENDOSCOPY;  Service: Cardiovascular;  Laterality: N/A;   CARDIOVERSION N/A 08/08/2016   Procedure: CARDIOVERSION;  Surgeon: Hugh Madura, MD;  Location: [facility] ENDOSCOPY;  Service: Cardiovascular;  Laterality: N/A;   CORONARY ANGIOPLASTY  11-13-2005 dr Grandville Lax   Successful touch up post-dilatation of the 3 tandem overlying Cypher stents in proximal to mid LAD (based on IVUS 0% stenosis and patent diagonal branch)/  Cutting Balloon Angioplasty of Ramus branch   CORONARY ANGIOPLASTY WITH STENT PLACEMENT  11-05-2005   dr Therese Flash brodie   PCI and DES x3 to birfurcation lesion LAD and diagonal branch of LAD/   20% dLM,  30-40% RCA, ef 60%   CYSTOSCOPY WITH RETROGRADE PYELOGRAM, URETEROSCOPY AND STENT PLACEMENT Right 07/14/2015   Procedure: CYSTOSCOPY WITH RETROGRADE PYELOGRAM, URETEROSCOPY AND STENT PLACEMENT;  Surgeon: Osborn Blaze, MD;  Location: [facility];  Service: Urology;  Laterality: Right;   ELECTROPHYSIOLOGIC STUDY N/A 10/24/2016   Procedure: Atrial Fibrillation Ablation;  Surgeon: Will Cortland Ding, MD;  Location: [facility] INVASIVE CV LAB;  Service: Cardiovascular;  Laterality: N/A;   EXTRACORPOREAL SHOCK WAVE LITHOTRIPSY  yrs ago   HEMORRHOID SURGERY N/A 10/24/2017   Procedure: HEMORRHOIDECTOMY;  Surgeon: Melvenia Stabs, MD;  Location: [facility] OR;  Service: General;  Laterality: N/A;   HERNIA REPAIR Bilateral 2000   Inguinal Hernia   IR GENERIC HISTORICAL   10/23/2016   IR FLUORO GUIDE CV LINE RIGHT 10/23/2016 Lucinda Saber, MD [facility]   IR GENERIC HISTORICAL  10/23/2016   IR US  GUIDE VASC ACCESS RIGHT 10/23/2016 Lucinda Saber, MD [facility]   LEFT URETEROSCOPIC STONE EXTRACTION  04/10/2006   STONE EXTRACTION WITH BASKET  Right 07/14/2015   Procedure: STONE EXTRACTION WITH BASKET;  Surgeon: Osborn Blaze, MD;  Location: [facility];  Service: Urology;  Laterality: Right;   TEE WITHOUT CARDIOVERSION N/A 08/05/2016   Procedure: TRANSESOPHAGEAL ECHOCARDIOGRAM (TEE);  Surgeon: Luana Rumple, MD;  Location: [facility] ENDOSCOPY;  Service: Cardiovascular;  Laterality: N/A;   TOTAL HIP ARTHROPLASTY Right 07/16/2022   Procedure: RIGHT TOTAL HIP ARTHROPLASTY ANTERIOR APPROACH;  Surgeon: Wes Hamman, MD;  Location: [facility] OR;  Service: Orthopedics;  Laterality: Right;  3-C   TRANSTHORACIC ECHOCARDIOGRAM  02/04/2012   grade I diastolic dysfunction/  ef 55-60%/  trivial MR and TR/  mild LAE   VIDEO BRONCHOSCOPY WITH ENDOBRONCHIAL NAVIGATION Right 02/09/2024   Procedure: VIDEO BRONCHOSCOPY WITH ENDOBRONCHIAL NAVIGATION;  Surgeon: Denson Flake, MD;  Location: [facility] ENDOSCOPY;  Service: Pulmonary;  Laterality: Right;  WITH FLOURO    REVIEW OF SYSTEMS:   Review of Systems  Constitutional: Positive for fatigue, generalized weakness, and weight loss.  Negative for chills and fever.  HENT: Negative for mouth sores, nosebleeds, sore throat and trouble swallowing.   Eyes: Negative for eye problems and icterus.  Respiratory:Positive for dyspnea on exertion.  Negative for cough, hemoptysis,  and wheezing.   Cardiovascular: Negative for chest pain and leg swelling.  Gastrointestinal: Negative for abdominal pain, constipation, diarrhea, nausea and vomiting.  Genitourinary: Negative for bladder incontinence, difficulty urinating, dysuria, frequency and hematuria.   Musculoskeletal: Negative for back pain, gait problem, neck pain and neck stiffness.  Skin:  Positive for rash. Neurological: Positive for generalized weakness. Negative for dizziness, extremity weakness, gait problem, headaches, and seizures.  Hematological: Negative for adenopathy. Does not bruise  Psychiatric/Behavioral: Negative for confusion, depression and sleep disturbance. The patient is not nervous/anxious.     PHYSICAL EXAMINATION:  There were no vitals taken for this visit.  ECOG PERFORMANCE STATUS: 2  Physical Exam  Constitutional: Oriented to person, place, and time and well-developed, well-nourished, and in no distress.  HENT:  Head: Normocephalic and atraumatic.  Mouth/Throat: Oropharynx is clear and moist. No oropharyngeal exudate.  Eyes: Conjunctivae are normal. Right eye exhibits no discharge. Left eye exhibits no discharge. No scleral icterus.  Neck: Normal range of motion. Neck supple.  Cardiovascular: Normal rate, regular rhythm, normal heart sounds and intact distal pulses.   Pulmonary/Chest: Effort normal and breath sounds normal. No respiratory distress. No wheezes. No rales.  Abdominal: Soft. Bowel sounds are normal. Exhibits no distension and no mass. There is no tenderness.  Musculoskeletal: Normal range of motion. Exhibits no edema.  Lymphadenopathy:    No cervical adenopathy.  Neurological: Alert and oriented to person, place, and time. Exhibits normal muscle tone.  Examined in the wheelchair. Skin: Skin is warm and dry.  Positive for rash on his extremities and trunk.  Not diaphoretic. No erythema. No pallor.  Psychiatric: Mood, memory and judgment normal.  Vitals reviewed.  LABORATORY DATA: Lab Results  Component Value Date   WBC 0.7 (LL) 04/08/2024   HGB 10.5 (L) 04/08/2024   HCT 30.4 (L) 04/08/2024   MCV 97.7 04/08/2024   PLT 54 (L) 04/08/2024      Chemistry      Component Value Date/Time   NA 134 (L) 04/08/2024 1629   K 4.5 04/08/2024 1629   CL 102 04/08/2024 1629   CO2 21 (L) 04/08/2024 1629   BUN 27 (H) 04/08/2024 1629    CREATININE 1.38 (H) 04/08/2024 1629   CREATININE 1.18 04/06/2024 1427   CREATININE 1.72 (H) 10/16/2016 1115  Component Value Date/Time   CALCIUM  9.2 04/08/2024 1629   ALKPHOS 100 04/06/2024 1427   AST 19 04/06/2024 1427   ALT 20 04/06/2024 1427   BILITOT 0.6 04/06/2024 1427       RADIOGRAPHIC STUDIES:  CT Angio Chest PE W and/or Wo Contrast Result Date: 04/08/2024 CLINICAL DATA:  Pulmonary embolism (PE) suspected, high prob History of lung cancer.  Weakness. EXAM: CT ANGIOGRAPHY CHEST WITH CONTRAST TECHNIQUE: Multidetector CT imaging of the chest was performed using the standard protocol during bolus administration of intravenous contrast. Multiplanar CT image reconstructions and MIPs were obtained to evaluate the vascular anatomy. RADIATION DOSE REDUCTION: This exam was performed according to the departmental dose-optimization program which includes automated exposure control, adjustment of the mA and/or kV according to patient size and/or use of iterative reconstruction technique. CONTRAST:  75mL OMNIPAQUE  IOHEXOL  350 MG/ML SOLN COMPARISON:  Radiograph earlier today. PET CT 02/23/2024. Chest CTA 01/22/2024 FINDINGS: Cardiovascular: There are no filling defects within the pulmonary arteries to suggest pulmonary embolus. Occlusion of the right upper lobe pulmonary arteries are chronic finding and unchanged from prior CTA. Mild cardiomegaly. There are coronary artery calcifications. Aortic atherosclerosis. Trace pericardial fluid. Mediastinum/Nodes: Moderate to large hiatal hernia again seen. Wall thickening of the distal esophagus, the upper esophagus is patulous. Again seen mild mediastinal adenopathy. This includes right paratracheal node measuring 14 mm. Right hilar soft tissue density which was hypermetabolic on prior PET is unchanged. Lungs/Pleura: Chronic right apical opacity with diminishing cavitary component from prior exam in contraction of the soft tissue density. Masslike opacity in  the posterior right lower lobe with central cavitation has slightly contracted from prior PET. Nodular opacity in the right middle lobe, series 12 image 71, increasing. Chronic subpleural reticulation is again seen. Calcified granuloma in the right lung. No significant pleural effusion. Upper Abdomen: Moderate to large hiatal hernia. Gallstone. No acute upper abdominal findings. Musculoskeletal: Diffuse thoracic spondylosis. No evidence of focal bone lesion or acute osseous finding. Review of the MIP images confirms the above findings. IMPRESSION: 1. No pulmonary embolus. 2. Chronic right apical opacity with diminishing cavitary component and contraction of the soft tissue density. Masslike opacity in the posterior right lower lobe with central cavitation has slightly contracted from prior PET. 3. Nodular opacity in the right middle lobe, increasing from prior exam. This may be infectious/inflammatory or neoplastic. 4. Unchanged mild mediastinal adenopathy. Right hilar soft tissue density which was hypermetabolic on prior PET is unchanged. 5. Moderate to large hiatal hernia. Wall thickening of the distal esophagus, can be seen with reflux. 6. Cholelithiasis. Aortic Atherosclerosis (ICD10-I70.0). Electronically Signed   By: Chadwick Colonel M.D.   On: 04/08/2024 20:02   DG Chest 2 View Result Date: 04/08/2024 CLINICAL DATA:  Shortness of breath EXAM: CHEST - 2 VIEW COMPARISON:  02/09/2024, 12/19/2023, chest CT 12/22/2023, PET CT 02/23/2024 FINDINGS: Low lung volumes. Masslike opacity in the right mid lung. Since prior chest radiograph, some resolution of cavitary and consolidative process in the right apical lung. Right hilar prominence may correspond to nodes. Perihilar streaky opacities which may be due to chronic lung disease and airways thickening. No pleural effusion or pneumothorax. Stable cardiomediastinal silhouette. IMPRESSION: Low lung volumes. Masslike opacity in the right mid lung, corresponding to  known malignancy. Since prior chest radiograph, some resolution of cavitary and consolidative process in the right apical lung. Chronic lung disease and possible central airways thickening Electronically Signed   By: Esmeralda Hedge M.D.   On: 04/08/2024 18:05  ASSESSMENT/PLAN:  This is a very pleasant 78 year old Caucasian male with stage III aT4, N1, M0) non-small cell lung cancer, adenocarcinoma.  He presented with a right lower lobe lung mass in addition to suspicious right upper lobe lesion and right hilar lymphadenopathy.  He was diagnosed in April 2025.  His molecular studies show he has no actionable mutations and his PD-L1 expression was negative.  He is currently undergoing neoadjuvant chemoimmunotherapy with carboplatin  for an AUC of 5, Alimta 500 mg/m, and Keytruda  200 mg IV every 3 weeks.  He status post 1 cycle and experience a significant rash which may be from his immunotherapy and cytopenias from his chemotherapy which required G-CSF injections.  He also had significant thrombocytopenia.  The patient was seen with Dr. Marguerita Shih today.  Due to the patient's significant cytopenias, Dr. Marguerita Shih discussed the options.  The patient's PD-L1 expression is negative and he does not have any actionable mutations.  Given the rash which could be secondary to his Alimta or Keytruda , we will try holding the Keytruda  today and restart his prednisone .  Dr. Marguerita Shih will reduce the dose of his chemotherapy with carboplatin  to an AUC of 4 and Alimta to 400 mg/m in hopes that he experiences less cytopenias.  However we will need to monitor his labs closely.  I also let the patient know if he develops any abnormal bleeding or bruising to call us  for same-day lab appointment.  I have standing orders in for weekly sample of blood bank.  We will consider him for transfusion if his hemoglobin were less than 8.  I also prescribed triamcinolone  cream for localized areas of significant rash.  The patient is  familiar with the side effects of steroids and knows he needs to monitor his blood sugar closely while taking steroids.  The patient's blood pressure slightly low today.  He took his antihypertensive today.  I recommended that he use a blood pressure cuff check his blood pressure prior to taking his antihypertensive.  If his blood pressure is low he may need to hold his lisinopril .  We also may give him additional IV fluids in the infusion room today.  We will do a walking test to see if he qualifies for for supplemental oxygen  Since Dr. Marguerita Shih is reducing the dose of his chemotherapy, Dr. Marguerita Shih would hold off on trying to obtain insurance authorization for Neulasta at this time.  We will monitor the labs closely on a weekly basis and arrange for Zarxio  on an as-needed basis if his ANC gets less than 0.6 or so.   Addendum: While in the infusion room he developed nausea and vomiting even prior to starting treatment. Therefore he will just get anti-emetics today and IVF and we will defer his treatment by 1 week.   the patient was advised to call immediately if he has any concerning symptoms in the interval. The patient voices understanding of current disease status and treatment options and is in agreement with the current care plan. All questions were answered. The patient knows to call the clinic with any problems, questions or concerns. We can certainly see the patient much sooner if necessary   No orders of the defined types were placed in this encounter.    Chisum Habenicht L Dulcey Riederer, PA-C 04/10/24  ADDENDUM: Hematology/oncology Attending: I had a face-to-face encounter with the patient today.  I reviewed his record, lab and recommended his care plan.  This is a very pleasant 78 years old white male diagnosed with stage IIIa  non-small cell lung cancer, adenocarcinoma in February 2025 with no actionable mutation and negative PD-L1 expression.  The patient is currently undergoing a course  of neoadjuvant chemoimmunotherapy with carboplatin , Alimta and Keytruda  status post 1 cycle.  He has a rough time with the first cycle of his treatment especially with the skin rash involving the face and chest that improved with steroid treatment.  He also had significant pancytopenia requiring G-CSF injection. He is here today for evaluation before starting cycle #2.  His rash has improved but continues to have areas of the skin rash especially on the arm and the chest. I recommended for the patient to proceed with cycle #2 today with reduced dose carboplatin  for AUC of 4 and Alimta 400 Mg/M2.  Will hold his dose of Keytruda  for now until improvement of the rash. Will start the patient on prednisone  20 mg p.o. daily for the next few days. When the patient presented for the infusion he developed significant nausea and vomiting.  He was given IV fluid with normal saline as well as antiemetics and his treatment will be delayed by 1 week until improvement of his condition. The patient was advised to call immediately if he has any other concerning symptoms in the interval. The total time spent in the appointment was 30 minutes including review of chart and various tests results, discussions about plan of care and coordination of care plan . Disclaimer: This note was dictated with voice recognition software. Similar sounding words can inadvertently be transcribed and may be missed upon review. Aurelio Blower, MD

## 2024-04-12 ENCOUNTER — Other Ambulatory Visit: Payer: Self-pay

## 2024-04-14 ENCOUNTER — Inpatient Hospital Stay: Attending: Internal Medicine

## 2024-04-14 ENCOUNTER — Inpatient Hospital Stay (HOSPITAL_BASED_OUTPATIENT_CLINIC_OR_DEPARTMENT_OTHER): Attending: Internal Medicine | Admitting: Physician Assistant

## 2024-04-14 ENCOUNTER — Encounter: Payer: Self-pay | Admitting: Physician Assistant

## 2024-04-14 ENCOUNTER — Encounter: Payer: Self-pay | Admitting: Internal Medicine

## 2024-04-14 VITALS — BP 95/81 | HR 91 | Temp 97.6°F | Resp 16 | Ht 66.0 in | Wt 209.8 lb

## 2024-04-14 DIAGNOSIS — C3431 Malignant neoplasm of lower lobe, right bronchus or lung: Secondary | ICD-10-CM

## 2024-04-14 DIAGNOSIS — L270 Generalized skin eruption due to drugs and medicaments taken internally: Secondary | ICD-10-CM

## 2024-04-14 DIAGNOSIS — R21 Rash and other nonspecific skin eruption: Secondary | ICD-10-CM | POA: Insufficient documentation

## 2024-04-14 DIAGNOSIS — I959 Hypotension, unspecified: Secondary | ICD-10-CM | POA: Diagnosis not present

## 2024-04-14 DIAGNOSIS — D61810 Antineoplastic chemotherapy induced pancytopenia: Secondary | ICD-10-CM | POA: Insufficient documentation

## 2024-04-14 DIAGNOSIS — Z5111 Encounter for antineoplastic chemotherapy: Secondary | ICD-10-CM | POA: Diagnosis not present

## 2024-04-14 DIAGNOSIS — D509 Iron deficiency anemia, unspecified: Secondary | ICD-10-CM | POA: Insufficient documentation

## 2024-04-14 DIAGNOSIS — D759 Disease of blood and blood-forming organs, unspecified: Secondary | ICD-10-CM | POA: Diagnosis not present

## 2024-04-14 LAB — CMP (CANCER CENTER ONLY)
ALT: 18 U/L (ref 0–44)
AST: 18 U/L (ref 15–41)
Albumin: 3.7 g/dL (ref 3.5–5.0)
Alkaline Phosphatase: 108 U/L (ref 38–126)
Anion gap: 10 (ref 5–15)
BUN: 45 mg/dL — ABNORMAL HIGH (ref 8–23)
CO2: 23 mmol/L (ref 22–32)
Calcium: 8.8 mg/dL — ABNORMAL LOW (ref 8.9–10.3)
Chloride: 104 mmol/L (ref 98–111)
Creatinine: 1.47 mg/dL — ABNORMAL HIGH (ref 0.61–1.24)
GFR, Estimated: 48 mL/min — ABNORMAL LOW (ref >60)
Glucose, Bld: 199 mg/dL — ABNORMAL HIGH (ref 70–99)
Potassium: 3.5 mmol/L (ref 3.5–5.1)
Sodium: 137 mmol/L (ref 135–145)
Total Bilirubin: 0.6 mg/dL (ref 0.0–1.2)
Total Protein: 6.4 g/dL — ABNORMAL LOW (ref 6.5–8.1)

## 2024-04-14 LAB — CBC WITH DIFFERENTIAL (CANCER CENTER ONLY)
Abs Immature Granulocytes: 1.34 10*3/uL — ABNORMAL HIGH (ref 0.00–0.07)
Basophils Absolute: 0.1 10*3/uL (ref 0.0–0.1)
Basophils Relative: 1 %
Eosinophils Absolute: 0 10*3/uL (ref 0.0–0.5)
Eosinophils Relative: 0 %
HCT: 25.9 % — ABNORMAL LOW (ref 39.0–52.0)
Hemoglobin: 9.5 g/dL — ABNORMAL LOW (ref 13.0–17.0)
Immature Granulocytes: 17 %
Lymphocytes Relative: 19 %
Lymphs Abs: 1.5 10*3/uL (ref 0.7–4.0)
MCH: 33.5 pg (ref 26.0–34.0)
MCHC: 36.7 g/dL — ABNORMAL HIGH (ref 30.0–36.0)
MCV: 91.2 fL (ref 80.0–100.0)
Monocytes Absolute: 1.5 10*3/uL — ABNORMAL HIGH (ref 0.1–1.0)
Monocytes Relative: 19 %
Neutro Abs: 3.5 10*3/uL (ref 1.7–7.7)
Neutrophils Relative %: 44 %
Platelet Count: 129 10*3/uL — ABNORMAL LOW (ref 150–400)
RBC: 2.84 MIL/uL — ABNORMAL LOW (ref 4.22–5.81)
RDW: 12.8 % (ref 11.5–15.5)
Smear Review: NORMAL
WBC Count: 7.9 10*3/uL (ref 4.0–10.5)
nRBC: 2.5 % — ABNORMAL HIGH (ref 0.0–0.2)

## 2024-04-14 LAB — SAMPLE TO BLOOD BANK

## 2024-04-14 MED ORDER — PALONOSETRON HCL INJECTION 0.25 MG/5ML: 0.2500 mg | Freq: Once | INTRAVENOUS | Status: DC

## 2024-04-14 MED ORDER — DEXAMETHASONE SODIUM PHOSPHATE 10 MG/ML IJ SOLN: 10.0000 mg | Freq: Once | INTRAMUSCULAR | Status: DC

## 2024-04-14 MED ORDER — APREPITANT 130 MG/18ML IV EMUL
130.0000 mg | Freq: Once | INTRAVENOUS | Status: DC
Start: 2024-04-14 — End: 2024-04-14

## 2024-04-14 MED ORDER — TRIAMCINOLONE ACETONIDE 0.1 % EX CREA
1.0000 | TOPICAL_CREAM | Freq: Two times a day (BID) | CUTANEOUS | 0 refills | Status: DC
Start: 2024-04-14 — End: 2024-06-26

## 2024-04-14 MED ORDER — SODIUM CHLORIDE 0.9 % IV SOLN: INTRAVENOUS | Status: DC

## 2024-04-14 MED ORDER — SODIUM CHLORIDE 0.9 % IV SOLN
327.2000 mg | Freq: Once | INTRAVENOUS | Status: DC
  Filled 2024-04-14: qty 33

## 2024-04-14 MED ORDER — PREDNISONE 20 MG PO TABS: 20.0000 mg | ORAL_TABLET | Freq: Two times a day (BID) | ORAL | 0 refills | Status: AC

## 2024-04-14 MED ORDER — SODIUM CHLORIDE 0.9 % IV SOLN: Freq: Once | INTRAVENOUS | Status: AC

## 2024-04-14 MED ORDER — SODIUM CHLORIDE 0.9 % IV SOLN
400.0000 mg/m2 | Freq: Once | INTRAVENOUS | Status: DC
  Filled 2024-04-14: qty 36

## 2024-04-14 NOTE — Patient Instructions (Signed)
 Dehydration, Adult Dehydration is a condition in which there is not enough water or other fluids in the body. This happens when a person loses more fluids than they take in. Important organs cannot work right without the right amount of fluids. Any loss of fluids from the body can cause dehydration. Dehydration can be mild, worse, or very bad. It should be treated right away to keep it from getting very bad. What are the causes? Conditions that cause loss of water in the body. They include: Watery poop (diarrhea). Vomiting. Sweating a lot. Fever. Infection. Peeing (urinating) a lot. Not drinking enough fluids. Certain medicines, such as medicines that take extra fluid out of the body (diuretics). Lack of safe drinking water. Not being able to get enough water and food. What increases the risk? Having a long-term (chronic) illness that has not been treated the right way, such as: Diabetes. Heart disease. Kidney disease. Being 25 years of age or older. Having a disability. Living in a place that is high above the ground or sea (high in altitude). The thinner, drier air causes more fluid loss. Doing exercises that put stress on your body for a long time. Being active when in hot places. What are the signs or symptoms? Symptoms of dehydration depend on how bad it is. Mild or worse dehydration Thirst. Dry lips or dry mouth. Feeling dizzy or light-headed. Muscle cramps. Passing little pee or dark pee. Pee may be the color of tea. Headache. Very bad dehydration Changes in skin. Skin may: Be cold to the touch (clammy). Be blotchy or pale. Not go back to normal right after you pinch it and let it go. Little or no tears, pee, or sweat. Fast breathing. Low blood pressure. Weak pulse. Pulse that is more than 100 beats a minute when you are sitting still. Other changes, such as: Feeling very thirsty. Eyes that look hollow (sunken). Cold hands and feet. Being confused. Being very  tired (lethargic) or having trouble waking from sleep. Losing weight. Loss of consciousness. How is this treated? Treatment for this condition depends on how bad your dehydration is. Treatment should start right away. Do not wait until your condition gets very bad. Very bad dehydration is an emergency. You will need to go to a hospital. Mild or worse dehydration can be treated at home. You may be asked to: Drink more fluids. Drink an oral rehydration solution (ORS). This drink gives you the right amount of fluids, salts, and minerals (electrolytes). Very bad dehydration can be treated: With fluids through an IV tube. By correcting low levels of electrolytes in the body. By treating the problem that caused your dehydration. Follow these instructions at home: Oral rehydration solution If told by your doctor, drink an ORS: Make an ORS. Use instructions on the package. Start by drinking small amounts, about  cup (120 mL) every 5-10 minutes. Slowly drink more until you have had the amount that your doctor said to have.  Eating and drinking  Drink enough clear fluid to keep your pee pale yellow. If you were told to drink an ORS, finish the ORS first. Then, start slowly drinking other clear fluids. Drink fluids such as: Water. Do not drink only water. Doing that can make the salt (sodium) level in your body get too low. Water from ice chips you suck on. Fruit juice that you have added water to (diluted). Low-calorie sports drinks. Eat foods that have the right amounts of salts and minerals, such as bananas, oranges, potatoes,  tomatoes, or spinach. Do not drink alcohol. Avoid drinks that have caffeine or sugar. These include:: High-calorie sports drinks. Fruit juice that you did not add water to. Soda. Coffee or energy drinks. Avoid foods that are greasy or have a lot of fat or sugar. General instructions Take over-the-counter and prescription medicines only as told by your doctor. Do  not take sodium tablets. Doing that can make the salt level in your body get too high. Return to your normal activities as told by your doctor. Ask your doctor what activities are safe for you. Keep all follow-up visits. Your doctor may check and change your treatment. Contact a doctor if: You have pain in your belly (abdomen) and the pain: Gets worse. Stays in one place. You have a rash. You have a stiff neck. You get angry or annoyed more easily than normal. You are more tired or have a harder time waking than normal. You feel weak or dizzy. You feel very thirsty. Get help right away if: You have any symptoms of very bad dehydration. You vomit every time you eat or drink. Your vomiting gets worse, does not go away, or you vomit blood or green stuff. You are getting treatment, but symptoms are getting worse. You have a fever. You have a very bad headache. You have: Diarrhea that gets worse or does not go away. Blood in your poop (stool). This may cause poop to look black and tarry. No pee in 6-8 hours. Only a small amount of pee in 6-8 hours, and the pee is very dark. You have trouble breathing. These symptoms may be an emergency. Get help right away. Call 911. Do not wait to see if the symptoms will go away. Do not drive yourself to the hospital. This information is not intended to replace advice given to you by your health care provider. Make sure you discuss any questions you have with your health care provider. Document Revised: 05/27/2022 Document Reviewed: 05/27/2022 Elsevier Patient Education  2024 [employer].

## 2024-04-14 NOTE — Progress Notes (Addendum)
 Hold Keytruda  today. Carbo AUC decreased to 4 and Alimta decreased to 400mg /m2 per Cassie, PA.  Aldina Porta, PharmD, MBA

## 2024-04-16 ENCOUNTER — Other Ambulatory Visit: Payer: Self-pay

## 2024-04-16 ENCOUNTER — Other Ambulatory Visit: Payer: Self-pay | Admitting: Cardiology

## 2024-04-20 ENCOUNTER — Inpatient Hospital Stay

## 2024-04-21 ENCOUNTER — Ambulatory Visit

## 2024-04-21 ENCOUNTER — Other Ambulatory Visit: Payer: Self-pay | Admitting: Physician Assistant

## 2024-04-21 ENCOUNTER — Other Ambulatory Visit: Payer: Self-pay | Admitting: Medical Oncology

## 2024-04-21 ENCOUNTER — Inpatient Hospital Stay

## 2024-04-21 ENCOUNTER — Encounter: Payer: Self-pay | Admitting: Internal Medicine

## 2024-04-21 DIAGNOSIS — I959 Hypotension, unspecified: Secondary | ICD-10-CM

## 2024-04-21 DIAGNOSIS — C3431 Malignant neoplasm of lower lobe, right bronchus or lung: Secondary | ICD-10-CM

## 2024-04-21 DIAGNOSIS — Z5111 Encounter for antineoplastic chemotherapy: Secondary | ICD-10-CM | POA: Diagnosis not present

## 2024-04-21 DIAGNOSIS — D509 Iron deficiency anemia, unspecified: Secondary | ICD-10-CM | POA: Diagnosis not present

## 2024-04-21 DIAGNOSIS — D61810 Antineoplastic chemotherapy induced pancytopenia: Secondary | ICD-10-CM | POA: Diagnosis not present

## 2024-04-21 DIAGNOSIS — R21 Rash and other nonspecific skin eruption: Secondary | ICD-10-CM | POA: Diagnosis not present

## 2024-04-21 LAB — CBC WITH DIFFERENTIAL (CANCER CENTER ONLY)
Abs Immature Granulocytes: 0.53 10*3/uL — ABNORMAL HIGH (ref 0.00–0.07)
Basophils Absolute: 0.1 10*3/uL (ref 0.0–0.1)
Basophils Relative: 1 %
Eosinophils Absolute: 0 10*3/uL (ref 0.0–0.5)
Eosinophils Relative: 0 %
HCT: 30.5 % — ABNORMAL LOW (ref 39.0–52.0)
Hemoglobin: 10.8 g/dL — ABNORMAL LOW (ref 13.0–17.0)
Immature Granulocytes: 4 %
Lymphocytes Relative: 13 %
Lymphs Abs: 1.8 10*3/uL (ref 0.7–4.0)
MCH: 34.5 pg — ABNORMAL HIGH (ref 26.0–34.0)
MCHC: 35.4 g/dL (ref 30.0–36.0)
MCV: 97.4 fL (ref 80.0–100.0)
Monocytes Absolute: 1.2 10*3/uL — ABNORMAL HIGH (ref 0.1–1.0)
Monocytes Relative: 8 %
Neutro Abs: 10.3 10*3/uL — ABNORMAL HIGH (ref 1.7–7.7)
Neutrophils Relative %: 74 %
Platelet Count: 216 10*3/uL (ref 150–400)
RBC: 3.13 MIL/uL — ABNORMAL LOW (ref 4.22–5.81)
RDW: 16.6 % — ABNORMAL HIGH (ref 11.5–15.5)
WBC Count: 13.8 10*3/uL — ABNORMAL HIGH (ref 4.0–10.5)
nRBC: 2.3 % — ABNORMAL HIGH (ref 0.0–0.2)

## 2024-04-21 LAB — CMP (CANCER CENTER ONLY)
ALT: 25 U/L (ref 0–44)
AST: 22 U/L (ref 15–41)
Albumin: 3.8 g/dL (ref 3.5–5.0)
Alkaline Phosphatase: 96 U/L (ref 38–126)
Anion gap: 10 (ref 5–15)
BUN: 46 mg/dL — ABNORMAL HIGH (ref 8–23)
CO2: 25 mmol/L (ref 22–32)
Calcium: 8.7 mg/dL — ABNORMAL LOW (ref 8.9–10.3)
Chloride: 100 mmol/L (ref 98–111)
Creatinine: 1.65 mg/dL — ABNORMAL HIGH (ref 0.61–1.24)
GFR, Estimated: 42 mL/min — ABNORMAL LOW (ref >60)
Glucose, Bld: 290 mg/dL — ABNORMAL HIGH (ref 70–99)
Potassium: 4.2 mmol/L (ref 3.5–5.1)
Sodium: 135 mmol/L (ref 135–145)
Total Bilirubin: 0.8 mg/dL (ref 0.0–1.2)
Total Protein: 6.4 g/dL — ABNORMAL LOW (ref 6.5–8.1)

## 2024-04-21 LAB — SAMPLE TO BLOOD BANK

## 2024-04-21 MED ORDER — SODIUM CHLORIDE 0.9 % IV SOLN: Freq: Once | INTRAVENOUS | Status: AC

## 2024-04-21 NOTE — Progress Notes (Signed)
 Patient c/o extreme fatigue starting last Friday; had a fall Saturday, no injuries besides a scraped knee. Says he saw a flash of white and his knees gave out, but did not lose consciousness. BP 93/54; says his normal is 120s/70s; pt wondering if that might be cause of tiredness.  MD notified, assessed patient at chairside.  Per MD, no chemotherapy today.  Patient to receive IVF today and Friday.

## 2024-04-21 NOTE — Patient Instructions (Signed)
 Hypotension As the heart beats, it forces blood through the body. Hypotension, commonly called low blood pressure, is when the force of blood pumping through the arteries is too weak. Arteries are blood vessels that carry blood from the heart throughout the body. Depending on the cause and severity, hypotension may be harmless (benign) or may cause serious problems (be critical). When your blood pressure is too low, you may not get enough blood to your brain or to the rest of your organs. This can cause weakness, light-headedness, a rapid heartbeat, and fainting. What are the causes? This condition may be caused by: Blood loss. Loss of body fluids (dehydration). Heart problems. Hormone (endocrine) problems. Pregnancy. Severe infection. Lack of certain nutrients. Severe allergic reactions (anaphylaxis). Certain medicines, such as blood pressure medicine or medicines that make the body lose excess fluids (diuretics). Sometimes, hypotension may be caused by not taking medicine as directed, such as taking too much of a certain medicine. What increases the risk? The following factors may make you more likely to develop this condition: Age. Risk increases as you get older. Having a condition that affects the heart or the central nervous system. What are the signs or symptoms? Common symptoms of this condition include: Weakness. Light-headedness. Dizziness. Blurred vision. Tiredness (fatigue). Rapid heartbeat. Fainting, in severe cases. How is this diagnosed? This condition is diagnosed based on: Your medical history. Your symptoms. Your blood pressure measurement. Your health care provider will check your blood pressure when you are: Lying down. Sitting. Standing. A blood pressure reading is recorded as two numbers, such as "120 over 80" (or 120/80). The first ("top") number is called the systolic pressure. It is a measure of the pressure in your arteries as your heart beats. The second  ("bottom") number is called the diastolic pressure. It is a measure of the pressure in your arteries when your heart relaxes between beats. Blood pressure is measured in a unit called mm Hg. Healthy blood pressure for most adults is 120/80. If your blood pressure is below 90/60, you may be diagnosed with hypotension. Other information or tests that may be used to diagnose hypotension include: Your other vital signs, such as your heart rate and temperature. Blood tests. Tilt table test. For this test, you will be safely secured to a table that moves you from a lying position to an upright position. Your heart rhythm and blood pressure will be monitored during the test. How is this treated? Treatment for this condition may include: Changing your diet. This may involve drinking more water or increasing your salt (sodium) intake with high-sodium foods. Taking medicines to raise your blood pressure. Changing the dosage of certain medicines you are taking that might be lowering your blood pressure. Wearing compression stockings. These stockings help to prevent blood clots and reduce swelling in your legs. In some cases, you may need to go to the hospital for: Fluid replacement. This means you will receive fluids through an IV. Blood replacement. This means you will receive donated blood through an IV (transfusion). Treating an infection or heart problems, if this applies. Monitoring. You may need to be monitored while medicines that you are taking wear off. Follow these instructions at home: Eating and drinking  Drink enough fluid to keep your urine pale yellow. Eat a healthy diet, and follow instructions from your health care provider about eating or drinking restrictions. A healthy diet includes: Fresh fruits and vegetables. Whole grains. Lean meats. Low-fat dairy products. Increase your salt intake if told  to do so. Do not add extra salt to your diet unless your health care provider tells you  to do that. Eat frequent, small meals. Avoid standing up suddenly after eating. Medicines Take over-the-counter and prescription medicines only as told by your health care provider. Follow instructions from your health care provider about changing the dosage of your current medicines, if this applies. Do not stop or adjust any of your medicines on your own. General instructions  Wear compression stockings as told by your health care provider. Get up slowly from lying down or sitting positions. This gives your blood pressure a chance to adjust. Avoid hot showers and excessive heat as directed by your health care provider. Return to your normal activities as told by your health care provider. Ask your health care provider what activities are safe for you. Do not use any products that contain nicotine or tobacco. These products include cigarettes, chewing tobacco, and vaping devices, such as e-cigarettes. If you need help quitting, ask your health care provider. Keep all follow-up visits. This is important. Contact a health care provider if: You vomit. You have diarrhea. You have a fever for more than 2-3 days. You feel more thirsty than usual. You feel weak and tired. Get help right away if: You have chest pain. You have a fast or irregular heartbeat. You develop numbness in any part of your body. You cannot move your arms or your legs. You have trouble speaking. You become sweaty or feel light-headed. You faint. You feel short of breath. You have trouble staying awake. You feel confused. These symptoms may be an emergency. Get help right away. Call 911. Do not wait to see if the symptoms will go away. Do not drive yourself to the hospital. Summary Hypotension is when the force of blood pumping through the arteries is too weak. Hypotension may be harmless (benign) or may cause serious problems (be critical). Treatment for this condition may include changing your diet, changing  your medicines, and wearing compression stockings. In some cases, you may need to go to the hospital for fluid or blood replacement. This information is not intended to replace advice given to you by your health care provider. Make sure you discuss any questions you have with your health care provider. Document Revised: 06/18/2021 Document Reviewed: 06/18/2021 Elsevier Patient Education  2024 [employer].

## 2024-04-22 ENCOUNTER — Telehealth: Payer: Self-pay | Admitting: Internal Medicine

## 2024-04-22 NOTE — Telephone Encounter (Signed)
 Left a voicemail with the scheduled appointment details.

## 2024-04-23 ENCOUNTER — Inpatient Hospital Stay

## 2024-04-23 ENCOUNTER — Other Ambulatory Visit: Payer: Self-pay

## 2024-04-23 DIAGNOSIS — C3431 Malignant neoplasm of lower lobe, right bronchus or lung: Secondary | ICD-10-CM | POA: Diagnosis not present

## 2024-04-23 DIAGNOSIS — D61810 Antineoplastic chemotherapy induced pancytopenia: Secondary | ICD-10-CM | POA: Diagnosis not present

## 2024-04-23 DIAGNOSIS — R21 Rash and other nonspecific skin eruption: Secondary | ICD-10-CM | POA: Diagnosis not present

## 2024-04-23 DIAGNOSIS — D509 Iron deficiency anemia, unspecified: Secondary | ICD-10-CM | POA: Diagnosis not present

## 2024-04-23 DIAGNOSIS — I959 Hypotension, unspecified: Secondary | ICD-10-CM

## 2024-04-23 DIAGNOSIS — Z5111 Encounter for antineoplastic chemotherapy: Secondary | ICD-10-CM | POA: Diagnosis not present

## 2024-04-23 MED ORDER — SODIUM CHLORIDE 0.9 % IV SOLN: Freq: Once | INTRAVENOUS | Status: AC

## 2024-04-23 NOTE — Patient Instructions (Signed)
Rehydration, Adult  Rehydration is the replacement of fluids, salts, and minerals in the body (electrolytes) that are lost during dehydration. Dehydration is when there is not enough water or other fluids in the body. This happens when you lose more fluids than you take in. People who are age 79 or older have a higher risk of dehydration than younger adults. This is because in older age, the body: Is less able to maintain the right amount of water. Does not respond to temperature changes as well. Does not get a sense of thirst as easily or quickly. Other causes include: Not drinking enough fluids. This can occur when you are ill, when you forget to drink, or when you are doing activities that require a lot of energy, especially in hot weather. Conditions that cause loss of water or other fluids. These include diarrhea, vomiting, sweating, or urinating a lot. Other illnesses, such as fever or infection. Certain medicines, such as those that remove excess fluid from the body (diuretics). Symptoms of mild or moderate dehydration may include thirst, dry lips and mouth, and dizziness. Symptoms of severe dehydration may include increased heart rate, confusion, fainting, and not urinating. In severe cases, you may need to get fluids through an IV at the hospital. For mild or moderate cases, you can usually rehydrate at home by drinking certain fluids as told by your health care provider. What are the risks? Rehydration is usually safe. Taking in too much fluid (overhydration) can be a problem but is rare. Overhydration can cause an imbalance of electrolytes in the body, kidney failure, fluid in the lungs, or a decrease in salt (sodium) levels in the body. Supplies needed: You will need an oral rehydration solution (ORS) if your health care provider tells you to use one. This is a drink to treat dehydration. It can be found in pharmacies and retail stores. How to rehydrate Fluids Follow instructions from  your health care provider about what to drink. The kind of fluid and the amount you should drink depend on your condition. In general, you should choose drinks that you prefer. If told by your health care provider, drink an ORS. Make an ORS by following instructions on the package. Start by drinking small amounts, about  cup (120 mL) every 5-10 minutes. Slowly increase how much you drink until you have taken in the amount recommended by your health care provider. Drink enough clear fluids to keep your urine pale yellow. If you were told to drink an ORS, finish it first, then start slowly drinking other clear fluids. Drink fluids such as: Water. This includes sparkling and flavored water. Drinking only water can lead to having too little sodium in your body (hyponatremia). Follow the advice of your health care provider. Water from ice chips you suck on. Fruit juice with water added to it(diluted). Sports drinks. Hot or cold herbal teas. Broth-based soups. Coffee. Milk or milk products. Food Follow instructions from your health care provider about what to eat while you rehydrate. Your health care provider may recommend that you slowly begin eating regular foods in small amounts. Eat foods that contain a healthy balance of electrolytes, such as bananas, oranges, potatoes, tomatoes, and spinach. Avoid foods that are greasy or contain a lot of sugar. In some cases, you may get nutrition through a feeding tube that is passed through your nose and into your stomach (nasogastric tube, or NG tube). This may be done if you have uncontrolled vomiting or diarrhea. Drinks to avoid  Certain drinks may make dehydration worse. While you rehydrate, avoid drinking alcohol. How to tell if you are recovering from dehydration You may be getting better if: You are urinating more often than before you started rehydrating. Your urine is pale yellow. Your energy level improves. You vomit less often. You have  diarrhea less often. Your appetite improves or returns to normal. You feel less dizzy or light-headed. Your skin tone and color start to look more normal. Follow these instructions at home: Take over-the-counter and prescription medicines only as told by your health care provider. Do not take sodium tablets. Doing this can lead to having too much sodium in your body (hypernatremia). Contact a health care provider if: You continue to have symptoms of mild or moderate dehydration, such as: Thirst. Dry lips. Slightly dry mouth. Dizziness. Dark urine or less urine than usual. Muscle cramps. You continue to vomit or have diarrhea. Get help right away if: You have symptoms of dehydration that get worse. You have a fever. You have a severe headache. You have been vomiting and have problems, such as: Your vomiting gets worse. Your vomit includes blood or green matter (bile). You cannot eat or drink without vomiting. You have problems with urination or bowel movements, such as: Diarrhea that gets worse. Blood in your stool (feces). This may cause stool to look black and tarry. Not urinating, or urinating only a small amount of very dark urine, within 6-8 hours. You have trouble breathing. You have symptoms that get worse with treatment. These symptoms may be an emergency. Get help right away. Call 911. Do not wait to see if the symptoms will go away. Do not drive yourself to the hospital. This information is not intended to replace advice given to you by your health care provider. Make sure you discuss any questions you have with your health care provider. Document Revised: 03/13/2022 Document Reviewed: 03/11/2022 Elsevier Patient Education  2024 [employer].

## 2024-04-27 ENCOUNTER — Inpatient Hospital Stay

## 2024-04-28 ENCOUNTER — Inpatient Hospital Stay (HOSPITAL_BASED_OUTPATIENT_CLINIC_OR_DEPARTMENT_OTHER)

## 2024-04-28 ENCOUNTER — Ambulatory Visit: Admitting: Internal Medicine

## 2024-04-28 ENCOUNTER — Inpatient Hospital Stay (HOSPITAL_BASED_OUTPATIENT_CLINIC_OR_DEPARTMENT_OTHER): Admitting: Internal Medicine

## 2024-04-28 ENCOUNTER — Other Ambulatory Visit

## 2024-04-28 ENCOUNTER — Encounter: Payer: Self-pay | Admitting: Physician Assistant

## 2024-04-28 ENCOUNTER — Encounter: Payer: Self-pay | Admitting: Internal Medicine

## 2024-04-28 ENCOUNTER — Inpatient Hospital Stay

## 2024-04-28 VITALS — BP 117/80 | HR 81 | Temp 97.9°F | Resp 18 | Ht 66.0 in | Wt 210.5 lb

## 2024-04-28 DIAGNOSIS — Z5111 Encounter for antineoplastic chemotherapy: Secondary | ICD-10-CM | POA: Diagnosis not present

## 2024-04-28 DIAGNOSIS — C3431 Malignant neoplasm of lower lobe, right bronchus or lung: Secondary | ICD-10-CM

## 2024-04-28 DIAGNOSIS — D509 Iron deficiency anemia, unspecified: Secondary | ICD-10-CM | POA: Diagnosis not present

## 2024-04-28 DIAGNOSIS — R21 Rash and other nonspecific skin eruption: Secondary | ICD-10-CM | POA: Diagnosis not present

## 2024-04-28 DIAGNOSIS — D61810 Antineoplastic chemotherapy induced pancytopenia: Secondary | ICD-10-CM | POA: Diagnosis not present

## 2024-04-28 LAB — CBC WITH DIFFERENTIAL (CANCER CENTER ONLY)
Abs Immature Granulocytes: 0.05 10*3/uL (ref 0.00–0.07)
Basophils Absolute: 0 10*3/uL (ref 0.0–0.1)
Basophils Relative: 0 %
Eosinophils Absolute: 0 10*3/uL (ref 0.0–0.5)
Eosinophils Relative: 0 %
HCT: 28.3 % — ABNORMAL LOW (ref 39.0–52.0)
Hemoglobin: 9.6 g/dL — ABNORMAL LOW (ref 13.0–17.0)
Immature Granulocytes: 1 %
Lymphocytes Relative: 21 %
Lymphs Abs: 1.2 10*3/uL (ref 0.7–4.0)
MCH: 33.8 pg (ref 26.0–34.0)
MCHC: 33.9 g/dL (ref 30.0–36.0)
MCV: 99.6 fL (ref 80.0–100.0)
Monocytes Absolute: 0.5 10*3/uL (ref 0.1–1.0)
Monocytes Relative: 9 %
Neutro Abs: 4 10*3/uL (ref 1.7–7.7)
Neutrophils Relative %: 69 %
Platelet Count: 148 10*3/uL — ABNORMAL LOW (ref 150–400)
RBC: 2.84 MIL/uL — ABNORMAL LOW (ref 4.22–5.81)
RDW: 18 % — ABNORMAL HIGH (ref 11.5–15.5)
WBC Count: 5.8 10*3/uL (ref 4.0–10.5)
nRBC: 0 % (ref 0.0–0.2)

## 2024-04-28 LAB — CMP (CANCER CENTER ONLY)
ALT: 18 U/L (ref 0–44)
AST: 18 U/L (ref 15–41)
Albumin: 3.7 g/dL (ref 3.5–5.0)
Alkaline Phosphatase: 93 U/L (ref 38–126)
Anion gap: 7 (ref 5–15)
BUN: 16 mg/dL (ref 8–23)
CO2: 25 mmol/L (ref 22–32)
Calcium: 8.7 mg/dL — ABNORMAL LOW (ref 8.9–10.3)
Chloride: 108 mmol/L (ref 98–111)
Creatinine: 1.03 mg/dL (ref 0.61–1.24)
GFR, Estimated: >60 mL/min (ref >60)
Glucose, Bld: 171 mg/dL — ABNORMAL HIGH (ref 70–99)
Potassium: 4.1 mmol/L (ref 3.5–5.1)
Sodium: 140 mmol/L (ref 135–145)
Total Bilirubin: 0.7 mg/dL (ref 0.0–1.2)
Total Protein: 6.3 g/dL — ABNORMAL LOW (ref 6.5–8.1)

## 2024-04-28 LAB — SAMPLE TO BLOOD BANK

## 2024-04-28 NOTE — Progress Notes (Signed)
 [facility] [phone]   [phone]  OFFICE PROGRESS NOTE  Koirala, [name], MD [address] [place] [address]  DIAGNOSIS: Stage IIIA (T4, N1, M0) non-small cell lung cancer, adenocarcinoma presented with right lower lobe lung mass in addition to suspicious right upper lobe lesion and right hilar lymphadenopathy diagnosed in April 2025.   Biomarker Findings HRD signature - Cannot Be Determined Microsatellite status - Cannot Be Determined ? Tumor Mutational Burden - Cannot Be Determined Genomic Findings For a complete list of the genes assayed, please refer to the Appendix. AKT2 amplification CDKN2A p16INK4a R80* and p14ARF P94L KRAS G12D ASXL1 W732fs*22 GNAS R201H U2AF1 S34F 7 Disease relevant genes with no reportable alterations: ALK, BRAF, EGFR, ERBB2, MET, RET, ROS1  PDL1 TPS: 0%  PRIOR THERAPY: Neoadjuvant systemic chemotherapy with carboplatin  for AUC of 5, Alimta 500 Mg/M2 and Keytruda  200 Mg IV every 3 weeks.  Status post 1 cycle.  This was discontinued secondary to intolerance.  CURRENT THERAPY: A course of concurrent chemoradiation with weekly carboplatin  for AUC of 2 and paclitaxel 45 Mg/M2.  First dose dose 32,025.  INTERVAL HISTORY: Peter [name] 78 y.o. male returns to the clinic today for follow-up visit.Discussed the use of AI scribe software for clinical note transcription with the patient, who gave verbal consent to proceed.  History of Present Illness   Peter [name] is a 78 year old male with stage IIIA non-small cell lung cancer who presents for re-evaluation after experiencing significant treatment-related toxicities.  He was diagnosed with stage IIIA non-small cell lung cancer, adenocarcinoma, in April 2025, initially presenting with a right lower lobe lung mass, a suspicious right upper lobe lesion, and right hilar lymphadenopathy. No actionable mutations were identified, and PD-L1  expression was zero percent.  He began neoadjuvant systemic chemoimmunotherapy with carboplatin , pemetrexed  (Alimta), and pembrolizumab  (Keytruda ). However, he experienced significant pancytopenia, rash, and other toxicities after one cycle, leading to a delay in treatment. He describes having a 'rough time' with low blood counts, including low white blood cells and platelets.  Since last Saturday, he feels better and can walk around the house without a walker, although he remains cautious after a fall last week. His blood pressure medication regimen was adjusted, with lisinopril  discontinued, Lasix  reduced, and carvedilol  continued, resulting in blood pressure readings averaging 130/75.  No chest pain or shortness of breath. He is able to perform activities such as washing his car, although he felt tired afterward and needed to rest.        MEDICAL HISTORY: Past Medical History:  Diagnosis Date   Arthritis    At risk for sleep apnea    STOP-BANG= 5       SENT TO PCP 07-12-2015   CAD (coronary artery disease)    a. s/p PCI with DES x 3 to LAD in 2006 with bifurcation lesion and Plavix  indefinitely was recommended.   Chronic combined systolic and diastolic CHF (congestive heart failure) (HCC)    a. Prior EF normal but EF dropped to 30-35% in 07/2016 in setting of AFIB.   CKD (chronic kidney disease), stage III (HCC)    Depression    Dyslipidemia    Dyspnea    tired quick d/t Afib   Dyspnea on exertion    Dysrhythmia    A.FIB   GERD (gastroesophageal reflux disease)    History of GI bleed    History of kidney stones    History  of MI (myocardial infarction)    11-05-2005--  periprocedural MI  (cardiac cath) per dr Grandville Lax note   Hypertension    Hypertriglyceridemia    Iron deficiency anemia    Myocardial infarction [facility])    Persistent atrial fibrillation (HCC)    a. 07/2016, started on Eliquis  -> readmitted later that month for AF RVR/acute HF - initially unsuccessful DCCV following  TEE, so loaded with amio with repeat successful DCCV 07/2816.   Right ureteral stone    S/P drug eluting coronary stent placement    x3  to LAD  2006   Sleep apnea    wears BIPAP, 14 up to 24    ALLERGIES:  is allergic to prednisone  and spironolactone .  MEDICATIONS:  Current Outpatient Medications  Medication Sig Dispense Refill   allopurinol  (ZYLOPRIM ) 100 MG tablet Take 100 mg by mouth at bedtime.     allopurinol  (ZYLOPRIM ) 300 MG tablet Take 300 mg by mouth in the morning.     atorvastatin  (LIPITOR) 40 MG tablet TAKE 1 TABLET EVERY DAY (NEED MD APPOINTMENT) 90 tablet 0   carvedilol  (COREG ) 25 MG tablet TAKE 1 TABLET TWICE DAILY WITH MEALS 180 tablet 0   Cyanocobalamin  (VITAMIN B-12 PO) Take 1 tablet by mouth daily.     diphenhydramine -acetaminophen  (TYLENOL  PM) 25-500 MG TABS tablet Take 1 tablet by mouth at bedtime.     ferrous sulfate  325 (65 FE) MG tablet Take 1 tablet (325 mg total) by mouth every morning. 90 tablet 2   folic acid  (FOLVITE ) 1 MG tablet Take 1 tablet (1 mg total) by mouth daily. Start 7 days before pemetrexed  chemotherapy. Continue until 21 days after pemetrexed  completed. 100 tablet 3   furosemide  (LASIX ) 40 MG tablet TAKE 1 TABLET EVERY DAY (Patient taking differently: Take 20 mg by mouth daily.) 90 tablet 0   hydrocortisone  1 % lotion Apply 1 Application topically 2 (two) times daily. (Patient taking differently: Apply 1 Application topically as needed.) 118 mL 0   nitroGLYCERIN  (NITROSTAT ) 0.4 MG SL tablet Place 1 tablet (0.4 mg total) under the tongue every 5 (five) minutes as needed for chest pain. X 3 doses 25 tablet 5   ondansetron  (ZOFRAN ) 8 MG tablet Take 1 tablet (8 mg total) by mouth every 8 (eight) hours as needed for nausea or vomiting. Start on the third day after carboplatin . 30 tablet 1   polyvinyl alcohol  (LIQUIFILM TEARS) 1.4 % ophthalmic solution Place 1 drop into both eyes daily as needed for dry eyes.     prochlorperazine  (COMPAZINE ) 10 MG tablet  Take 1 tablet (10 mg total) by mouth every 6 (six) hours as needed for nausea or vomiting. 30 tablet 1   rivaroxaban  (XARELTO ) 20 MG TABS tablet Take 1 tablet (20 mg total) by mouth at bedtime. Okay to restart this medication on 02/10/24     sertraline  (ZOLOFT ) 25 MG tablet Take 25 mg by mouth daily.     triamcinolone  cream (KENALOG ) 0.1 % Apply 1 Application topically 2 (two) times daily. (Patient taking differently: Apply 1 Application topically as needed.) 453.6 g 0   albuterol  (VENTOLIN  HFA) 108 (90 Base) MCG/ACT inhaler Inhale 2 puffs into the lungs every 6 (six) hours as needed for wheezing or shortness of breath (intractable cough). 8 g 2   benzonatate  (TESSALON  PERLES) 100 MG capsule Take 2 capsules (200 mg total) by mouth 2 (two) times daily as needed for cough. 30 capsule 1   budeson-glycopyrrolate-formoterol (BREZTRI  AEROSPHERE) 160-9-4.8 MCG/ACT AERO Inhale 2 puffs  into the lungs in the morning and at bedtime.     lidocaine -prilocaine  (EMLA ) cream Apply to affected area once 30 g 3   lisinopril  (ZESTRIL ) 40 MG tablet TAKE 1 TABLET EVERY DAY 90 tablet 3   No current facility-administered medications for this visit.    SURGICAL HISTORY:  Past Surgical History:  Procedure Laterality Date   BRONCHIAL BIOPSY  02/09/2024   Procedure: BRONCHOSCOPY, WITH BIOPSY;  Surgeon: Denson Flake, MD;  Location: [facility] ENDOSCOPY;  Service: Pulmonary;;   BRONCHIAL BRUSHINGS  02/09/2024   Procedure: BRONCHOSCOPY, WITH BRUSH BIOPSY;  Surgeon: Denson Flake, MD;  Location: [facility] ENDOSCOPY;  Service: Pulmonary;;   BRONCHIAL NEEDLE ASPIRATION BIOPSY  02/09/2024   Procedure: BRONCHOSCOPY, WITH NEEDLE ASPIRATION BIOPSY;  Surgeon: Denson Flake, MD;  Location: [facility] ENDOSCOPY;  Service: Pulmonary;;   BRONCHIAL WASHINGS  02/09/2024   Procedure: IRRIGATION, BRONCHUS;  Surgeon: Denson Flake, MD;  Location: [facility] ENDOSCOPY;  Service: Pulmonary;;   CARDIOVASCULAR STRESS TEST  01-06-2013  dr Ardell Koller   normal perfusion study/   no ischemia or infarct/  normal LV function and wall motion , ef 57%   CARDIOVERSION N/A 08/05/2016   Procedure: CARDIOVERSION;  Surgeon: Luana Rumple, MD;  Location: [facility] ENDOSCOPY;  Service: Cardiovascular;  Laterality: N/A;   CARDIOVERSION N/A 08/08/2016   Procedure: CARDIOVERSION;  Surgeon: Hugh Madura, MD;  Location: [facility] ENDOSCOPY;  Service: Cardiovascular;  Laterality: N/A;   CORONARY ANGIOPLASTY  11-13-2005 dr Grandville Lax   Successful touch up post-dilatation of the 3 tandem overlying Cypher stents in proximal to mid LAD (based on IVUS 0% stenosis and patent diagonal branch)/  Cutting Balloon Angioplasty of Ramus branch   CORONARY ANGIOPLASTY WITH STENT PLACEMENT  11-05-2005   dr Therese Flash brodie   PCI and DES x3 to birfurcation lesion LAD and diagonal branch of LAD/   20% dLM,  30-40% RCA, ef 60%   CYSTOSCOPY WITH RETROGRADE PYELOGRAM, URETEROSCOPY AND STENT PLACEMENT Right 07/14/2015   Procedure: CYSTOSCOPY WITH RETROGRADE PYELOGRAM, URETEROSCOPY AND STENT PLACEMENT;  Surgeon: Osborn Blaze, MD;  Location: [facility];  Service: Urology;  Laterality: Right;   ELECTROPHYSIOLOGIC STUDY N/A 10/24/2016   Procedure: Atrial Fibrillation Ablation;  Surgeon: Will Cortland Ding, MD;  Location: [facility] INVASIVE CV LAB;  Service: Cardiovascular;  Laterality: N/A;   EXTRACORPOREAL SHOCK WAVE LITHOTRIPSY  yrs ago   HEMORRHOID SURGERY N/A 10/24/2017   Procedure: HEMORRHOIDECTOMY;  Surgeon: Melvenia Stabs, MD;  Location: [facility] OR;  Service: General;  Laterality: N/A;   HERNIA REPAIR Bilateral 2000   Inguinal Hernia   IR GENERIC HISTORICAL  10/23/2016   IR FLUORO GUIDE CV LINE RIGHT 10/23/2016 Lucinda Saber, MD [facility]   IR GENERIC HISTORICAL  10/23/2016   IR US  GUIDE VASC ACCESS RIGHT 10/23/2016 Lucinda Saber, MD [facility]   LEFT URETEROSCOPIC STONE EXTRACTION  04/10/2006   STONE EXTRACTION WITH BASKET Right 07/14/2015   Procedure: STONE EXTRACTION WITH BASKET;  Surgeon: Osborn Blaze, MD;  Location: [facility];  Service: Urology;  Laterality: Right;   TEE WITHOUT CARDIOVERSION N/A 08/05/2016   Procedure: TRANSESOPHAGEAL ECHOCARDIOGRAM (TEE);  Surgeon: Luana Rumple, MD;  Location: [facility] ENDOSCOPY;  Service: Cardiovascular;  Laterality: N/A;   TOTAL HIP ARTHROPLASTY Right 07/16/2022   Procedure: RIGHT TOTAL HIP ARTHROPLASTY ANTERIOR APPROACH;  Surgeon: Wes Hamman, MD;  Location: [facility] OR;  Service: Orthopedics;  Laterality: Right;  3-C   TRANSTHORACIC ECHOCARDIOGRAM  02/04/2012   grade I diastolic dysfunction/  ef 55-60%/  trivial MR and TR/  mild LAE   VIDEO BRONCHOSCOPY WITH ENDOBRONCHIAL NAVIGATION Right 02/09/2024   Procedure: VIDEO BRONCHOSCOPY WITH ENDOBRONCHIAL NAVIGATION;  Surgeon: Denson Flake, MD;  Location: [facility] ENDOSCOPY;  Service: Pulmonary;  Laterality: Right;  WITH FLOURO    REVIEW OF SYSTEMS:  Constitutional: positive for fatigue and weight loss Eyes: negative Ears, nose, mouth, throat, and face: negative Respiratory: negative Cardiovascular: negative Gastrointestinal: negative Genitourinary:negative Integument/breast: positive for rash Hematologic/lymphatic: negative Musculoskeletal:negative Neurological: negative Behavioral/Psych: negative Endocrine: negative Allergic/Immunologic: negative   PHYSICAL EXAMINATION: General appearance: alert, cooperative, fatigued, and no distress Head: Normocephalic, without obvious abnormality, atraumatic Neck: no adenopathy, no JVD, supple, symmetrical, trachea midline, and thyroid  not enlarged, symmetric, no tenderness/mass/nodules Lymph nodes: Cervical, supraclavicular, and axillary nodes normal. Resp: clear to auscultation bilaterally Back: symmetric, no curvature. ROM normal. No CVA tenderness. Cardio: regular rate and rhythm, S1, S2 normal, no murmur, click, rub or gallop GI: soft, non-tender; bowel sounds normal; no masses,  no organomegaly Extremities: extremities normal, atraumatic, no  cyanosis or edema Neurologic: Alert and oriented X 3, normal strength and tone. Normal symmetric reflexes. Normal coordination and gait  ECOG PERFORMANCE STATUS: 1 - Symptomatic but completely ambulatory  Blood pressure 117/80, pulse 81, temperature 97.9 F (36.6 C), temperature source Tympanic, resp. rate 18, height 5' 6 (1.676 m), weight 210 lb 8 oz (95.5 kg), SpO2 97%.  LABORATORY DATA: Lab Results  Component Value Date   WBC 5.8 04/28/2024   HGB 9.6 (L) 04/28/2024   HCT 28.3 (L) 04/28/2024   MCV 99.6 04/28/2024   PLT 148 (L) 04/28/2024      Chemistry      Component Value Date/Time   NA 140 04/28/2024 1000   K 4.1 04/28/2024 1000   CL 108 04/28/2024 1000   CO2 25 04/28/2024 1000   BUN 16 04/28/2024 1000   CREATININE 1.03 04/28/2024 1000   CREATININE 1.72 (H) 10/16/2016 1115      Component Value Date/Time   CALCIUM  8.7 (L) 04/28/2024 1000   ALKPHOS 93 04/28/2024 1000   AST 18 04/28/2024 1000   ALT 18 04/28/2024 1000   BILITOT 0.7 04/28/2024 1000       RADIOGRAPHIC STUDIES: CT Angio Chest PE W and/or Wo Contrast Result Date: 04/08/2024 CLINICAL DATA:  Pulmonary embolism (PE) suspected, high prob History of lung cancer.  Weakness. EXAM: CT ANGIOGRAPHY CHEST WITH CONTRAST TECHNIQUE: Multidetector CT imaging of the chest was performed using the standard protocol during bolus administration of intravenous contrast. Multiplanar CT image reconstructions and MIPs were obtained to evaluate the vascular anatomy. RADIATION DOSE REDUCTION: This exam was performed according to the departmental dose-optimization program which includes automated exposure control, adjustment of the mA and/or kV according to patient size and/or use of iterative reconstruction technique. CONTRAST:  75mL OMNIPAQUE  IOHEXOL  350 MG/ML SOLN COMPARISON:  Radiograph earlier today. PET CT 02/23/2024. Chest CTA 01/22/2024 FINDINGS: Cardiovascular: There are no filling defects within the pulmonary arteries to suggest  pulmonary embolus. Occlusion of the right upper lobe pulmonary arteries are chronic finding and unchanged from prior CTA. Mild cardiomegaly. There are coronary artery calcifications. Aortic atherosclerosis. Trace pericardial fluid. Mediastinum/Nodes: Moderate to large hiatal hernia again seen. Wall thickening of the distal esophagus, the upper esophagus is patulous. Again seen mild mediastinal adenopathy. This includes right paratracheal node measuring 14 mm. Right hilar soft tissue density which was hypermetabolic on prior PET is unchanged. Lungs/Pleura: Chronic right apical opacity with diminishing cavitary component from prior exam in contraction of the soft  tissue density. Masslike opacity in the posterior right lower lobe with central cavitation has slightly contracted from prior PET. Nodular opacity in the right middle lobe, series 12 image 71, increasing. Chronic subpleural reticulation is again seen. Calcified granuloma in the right lung. No significant pleural effusion. Upper Abdomen: Moderate to large hiatal hernia. Gallstone. No acute upper abdominal findings. Musculoskeletal: Diffuse thoracic spondylosis. No evidence of focal bone lesion or acute osseous finding. Review of the MIP images confirms the above findings. IMPRESSION: 1. No pulmonary embolus. 2. Chronic right apical opacity with diminishing cavitary component and contraction of the soft tissue density. Masslike opacity in the posterior right lower lobe with central cavitation has slightly contracted from prior PET. 3. Nodular opacity in the right middle lobe, increasing from prior exam. This may be infectious/inflammatory or neoplastic. 4. Unchanged mild mediastinal adenopathy. Right hilar soft tissue density which was hypermetabolic on prior PET is unchanged. 5. Moderate to large hiatal hernia. Wall thickening of the distal esophagus, can be seen with reflux. 6. Cholelithiasis. Aortic Atherosclerosis (ICD10-I70.0). Electronically Signed   By:  Chadwick Colonel M.D.   On: 04/08/2024 20:02   DG Chest 2 View Result Date: 04/08/2024 CLINICAL DATA:  Shortness of breath EXAM: CHEST - 2 VIEW COMPARISON:  02/09/2024, 12/19/2023, chest CT 12/22/2023, PET CT 02/23/2024 FINDINGS: Low lung volumes. Masslike opacity in the right mid lung. Since prior chest radiograph, some resolution of cavitary and consolidative process in the right apical lung. Right hilar prominence may correspond to nodes. Perihilar streaky opacities which may be due to chronic lung disease and airways thickening. No pleural effusion or pneumothorax. Stable cardiomediastinal silhouette. IMPRESSION: Low lung volumes. Masslike opacity in the right mid lung, corresponding to known malignancy. Since prior chest radiograph, some resolution of cavitary and consolidative process in the right apical lung. Chronic lung disease and possible central airways thickening Electronically Signed   By: Esmeralda Hedge M.D.   On: 04/08/2024 18:05    ASSESSMENT AND PLAN: This is a very pleasant 78 years old white male with Stage IIIA (T4, N1, M0) non-small cell lung cancer, adenocarcinoma presented with right lower lobe lung mass in addition to suspicious right upper lobe lesion and right hilar lymphadenopathy diagnosed in April 2025.  Molecular studies showed no actionable mutations and PD-L1 expression was negative. The patient is currently undergoing neoadjuvant chemoimmunotherapy with carboplatin  AUC of 5, Alimta 500 Mg/M2 and Keytruda  200 Mg IV every 3 weeks status post 1 cycle.  He tolerated the first cycle of his treatment well except for mild fatigue and mild skin rash on the face. The patient has intolerance to the neoadjuvant chemoimmunotherapy and this was discontinued. I had a lengthy discussion with him about his current condition and treatment options.  I discussed with him consideration of treatment with a course of concurrent chemoradiation with weekly carboplatin  for AUC of 2 and paclitaxel  45 Mg/M2.  First dose expected May 10, 2024. Assessment and Plan    Stage 3A non-small cell lung cancer, adenocarcinoma Stage 3A non-small cell lung cancer, adenocarcinoma diagnosed in April 2025, presenting with right lower lobe lung mass, suspicious right upper lobe lesion, and right hilar lymphadenopathy. No actionable mutations and PD-L1 expression of 0%. Initial treatment with neoadjuvant systemic chemoimmunotherapy (carboplatin , Alimta, and Keytruda ) resulted in significant toxicities, including pancytopenia, leading to treatment delay. Decision to discontinue current regimen due to poor tolerance and switch to a milder chemotherapy regimen in conjunction with radiation therapy, which is expected to be better tolerated and effective  in conjunction with radiation to kill cancer cells. - Discuss with Dr. Eloise Hake about starting chemo and radiation therapy. - Plan to start mild chemotherapy every Monday in conjunction with radiation therapy beginning the Monday after next. - Coordinate with Dr. Eloise Hake for radiation planning.  Pancytopenia due to chemotherapy Pancytopenia secondary to neoadjuvant systemic chemoimmunotherapy, resulting in significant treatment delay. Decision to discontinue current chemotherapy regimen due to poor hematologic recovery and switch to a milder chemotherapy regimen in conjunction with radiation therapy, which is expected to be better tolerated.   The patient was advised to call immediately if he has any concerning symptoms in the interval. The patient voices understanding of current disease status and treatment options and is in agreement with the current care plan.  All questions were answered. The patient knows to call the clinic with any problems, questions or concerns. We can certainly see the patient much sooner if necessary.  The total time spent in the appointment was 30 minutes.  Disclaimer: This note was dictated with voice recognition software. Similar  sounding words can inadvertently be transcribed and may not be corrected upon review.

## 2024-04-28 NOTE — Progress Notes (Signed)
 DISCONTINUE OFF PATHWAY REGIMEN - Non-Small Cell Lung   [id number]:Pembrolizumab  200 mg  IV D1 + Pemetrexed  500 mg/m2 IV D1 + Carboplatin  AUC=5 IV D1 q21 Days:   A cycle is every 21 days:     Pembrolizumab       Pemetrexed       Carboplatin    **Always confirm dose/schedule in your pharmacy ordering system**  PRIOR TREATMENT: Off Pathway: Pembrolizumab  200 mg  IV D1 + Pemetrexed  500 mg/m2 IV D1 + Carboplatin  AUC=5 IV D1 q21 Days  START ON PATHWAY REGIMEN - Non-Small Cell Lung     A cycle is every 7 days, concurrent with RT:     Paclitaxel      Carboplatin    **Always confirm dose/schedule in your pharmacy ordering system**  Patient Characteristics: Preoperative or Nonsurgical Candidate (Clinical Staging), Stage IIB (N2a only) or Stage III - Nonsurgical Candidate, PS = 0,1 Therapeutic Status: Preoperative or Nonsurgical Candidate (Clinical Staging) AJCC T Category: cT4 AJCC N Category: cN1 AJCC M Category: cM0 AJCC 9 Stage Grouping: IIIA Check here if patient was staged using an edition other than AJCC Staging 9th Edition: false ECOG Performance Status: 1 Intent of Therapy: Curative Intent, Discussed with Patient

## 2024-04-29 ENCOUNTER — Other Ambulatory Visit: Payer: Self-pay

## 2024-04-29 NOTE — Progress Notes (Signed)
 Location of tumor and Histology per Pathology Report:    Biopsy:   Past/Anticipated interventions by surgeon, if any: right    VIDEO BRONCHOSCOPY WITH ENDOBRONCHIAL NAVIGATION (Right) BRONCHOSCOPY, WITH NEEDLE ASPIRATION BIOPSY BRONCHOSCOPY, WITH BRUSH BIOPSY BRONCHOSCOPY, WITH BIOPSY IRRIGATION, BRONCHUS Procedures   Past/Anticipated interventions by medical oncology, if any:      Pain issues, if any:  {:18581} {PAIN DESCRIPTION:[id number]}  SAFETY ISSUES: Prior radiation? {:18581} Pacemaker/ICD? {:18581} Possible current pregnancy? no Is the patient on methotrexate? {:18581}  Current Complaints / other details:  ***     ***

## 2024-04-30 ENCOUNTER — Other Ambulatory Visit: Payer: Self-pay

## 2024-04-30 NOTE — Progress Notes (Signed)
 Pharmacist Chemotherapy Monitoring - Initial Assessment    Anticipated start date: 05/10/24   The following has been reviewed per standard work regarding the patient's treatment regimen: The patient's diagnosis, treatment plan and drug doses, and organ/hematologic function Lab orders and baseline tests specific to treatment regimen  The treatment plan start date, drug sequencing, and pre-medications Prior authorization status  Patient's documented medication list, including drug-drug interaction screen and prescriptions for anti-emetics and supportive care specific to the treatment regimen The drug concentrations, fluid compatibility, administration routes, and timing of the medications to be used The patient's access for treatment and lifetime cumulative dose history, if applicable  The patient's medication allergies and previous infusion related reactions, if applicable   Changes made to treatment plan:  N/A  Follow up needed:  Home anti-emetics  Winfield Caba, PharmD, MBA

## 2024-04-30 NOTE — Progress Notes (Signed)
 Radiation Oncology         [phone] ________________________________  Follow-Up New Visit   Outpatient   Name: Peter [name] MRN: [id number]  Date: 05/03/2024  DOB: 1946/05/20  WU:[id number], [name], MD  Marlene Simas, MD   REFERRING PHYSICIAN: Marlene Simas, MD  DIAGNOSIS: There were no encounter diagnoses.  The encounter diagnosis was Malignant neoplasm of bronchus of right lower lobe (HCC).   Stage IIIA (T4, N1, M0) non-small cell lung cancer, adenocarcinoma of the RUL and RLL - presented with right lower lobe lung mass in addition to suspicious right upper lobe lesion and right hilar lymphadenopathy diagnosed in April 2025.    Cancer Staging  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) Staging form: Lung, AJCC V9 - Clinical: Stage IIIA (cT4, cN1, cM0) - Signed by Marlene Simas, MD on 03/16/2024  Narrative / Interval History - FUN Visit 05/03/24 ::Peter [name] is a 78 y.o. male who is accompanied by ***. he returns to us  today for further discuss the role radiation therapy as part of management for his recently diagnosed right lung cancer. To review from his initial consultation visit on 04/10, we did not advise radiation therapy at that time due to his high risk for complications related to the extent of his tumor and pre-existing interstitial lung disease. Evaluation and care by medical oncology and further work-up were also pending at that time (outlined below).   Since his initial consultation date, the patient presented for an MRI of the brain with and without contrast on 04/14 who showed no evidence of intracranial metastatic disease. A PET scan was also performed that same day which demonstrated: diffuse hypermetabolism associated with the consolidative mass-like opacity in the RLL extending from the hilum out to the pleura concerning for malignancy, and measuring 4.9 cm in the greatest extent. Imaging also showed: low level uptake associated with the  consolidative RUL opacity which appeared stable; abnormal uptake associated with the small areas of nodularity in the RML; several hypermetabolic right hilar nodes concerning for metastatic disease; and some additional pathologically enlarged nodes in the mediastinum without significant uptake above blood pool.   He was then seen in consultation by Dr. Marguerita Shih on 03/16/24. Dr. Marguerita Shih presented the patient's case at the thoracic conference held around that time, and it was felt that a neoadjuvant course of chemoimmunotherapy was advisable to shrink the tumor before considering a course of concurrent chemoradiation.   Based on Dr. Bing Buff recommendations, the patient opted to proceed with neoadjuvant chemoimmunotherapy consisting of Keytruda , Alimta, and Carboplatin  on 03/23/24. He initially tolerated his first cycle of systemic therapy well, but then began experiencing significant fatigue, (which he described as feeling like somebody beat me with an open stick). Other toxicities reported included a rash, a metallic taste in his mouth, lack of taste, sore throat, weight loss secondary to decreased appetite (approximately 6 lb), and pancytopenia. Given his symptoms, Keytruda  was subsequently held starting with his second cycle on 06/04. Carboplatin  and alimta were also administered at a reduced dose starting with cycle 2.    He also presented to the [facility] on 05/29 after labs showed low platelets. He also reported having low O2 sats in the high 80s and endorsed exertional dyspnea at that time which prompted a CTA of the chest to be performed which showed no evidence of PE and slight improvement of the right apical and RLL opacities. The RML nodular opacity and mediastinal adenopathy otherwise appeared unchanged. Labs showed improvement  in his platelet counts and his symptoms seemed to improve in the UC without further intervention. His symptoms were ultimately attributed to chemotherapy and he was discharged  home in stable condition.   Unfortunately, his side effects from treatment did not improve despite the changes to his chemo regimen. Dr. Marguerita Shih has accordingly discontinued chemoimmunotherapy due to poor tolerance and has recommended transitioning his treatment to chemoradiation with a milder chemo regimen consisting of paclitaxel and carboplatin .    HPI Initial Consultation Visit - 02/19/24 ::Peter [name] is a 78 y.o. male who is accompanied by his supportive wife. he is seen as a courtesy of Dr. Racheal Buddle for an opinion concerning radiation therapy as part of management for his recently diagnosed lung cancer. Patient is without a smoking history and has no known exposure to tuberculosis, black mold, or other inhaled toxins.    The patient presented to the ED on 01/22/24 with complains of elevated blood pressure, more than 180 systolic. To further investigate his symptoms, he underwent a CTA-PE showing abrupt occlusion of right upper lobe pulmonary artery without significant enhancement in right upper lobe lung parenchymal abnormality; Persistent superior right lower lobe mass-like consolidation with cavitation, measures about 4.7 by 3.6 cm,previously 4.6 x 3.9 cm and evidence of mild chronic lung disease with subpleural fibrosis and areas of ground-glass disease.   In light of findings, he was referred to Dr. Baldwin Levee on 01/30/24 where he reported experiences dyspnea with minimal exertion and struggles to reach 1200 on the incentive spirometer, which is set at 1500. To further evaluate his symptoms and persistent mass, he proceeded with a video bronchoscopy with endobronchial navigation on 02/09/24. 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    Most recent CT super chest done on 02/05/24 showed thick-walled cavitary masslike consolidation in the right upper lobe measures roughly 5.6 x 6.8 cm; Masslike consolidation in the superior segment  right lower lobe measures 4.2 x 4.3 cm. 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          Subsequently, he was referred to NP Dara Ear on 02/16/24. Upon discussion, patient agreed to undergo PET scan and brain MRI along with pulmonary function test for further staging purposes. He also decided to proceed with radiation therapy consultation in the meantime.       Of note: PET scan and brain MRI are scheduled for 02/23/24.   Today the patient endorses shortness of breath consistent with his baseline since follow-up last year.  He denies any productive cough, hemoptysis, chest pain, or pain elsewhere throughout the body.   PREVIOUS RADIATION THERAPY: No   PAST MEDICAL HISTORY:  Past Medical History:  Diagnosis Date   Arthritis    At risk for sleep apnea    STOP-BANG= 5       SENT TO PCP 07-12-2015   CAD (coronary artery disease)    a. s/p PCI with DES x 3 to LAD in 2006 with bifurcation lesion and Plavix  indefinitely was recommended.   Chronic combined systolic and diastolic CHF (congestive heart failure) (HCC)    a. Prior EF normal but EF dropped to 30-35% in 07/2016 in setting of AFIB.   CKD (chronic kidney disease), stage III (HCC)    Depression    Dyslipidemia    Dyspnea    tired quick d/t Afib   Dyspnea on exertion    Dysrhythmia    A.FIB   GERD (gastroesophageal  reflux disease)    History of GI bleed    History of kidney stones    History of MI (myocardial infarction)    11-05-2005--  periprocedural MI  (cardiac cath) per dr Grandville Lax note   Hypertension    Hypertriglyceridemia    Iron deficiency anemia    Myocardial infarction [facility])    Persistent atrial fibrillation (HCC)    a. 07/2016, started on Eliquis  -> readmitted later that month for AF RVR/acute HF - initially unsuccessful DCCV following TEE, so loaded with amio with repeat successful DCCV 07/2816.   Right ureteral stone    S/P drug  eluting coronary stent placement    x3  to LAD  2006   Sleep apnea    wears BIPAP, 14 up to 24    PAST SURGICAL HISTORY: Past Surgical History:  Procedure Laterality Date   BRONCHIAL BIOPSY  02/09/2024   Procedure: BRONCHOSCOPY, WITH BIOPSY;  Surgeon: Denson Flake, MD;  Location: [facility] ENDOSCOPY;  Service: Pulmonary;;   BRONCHIAL BRUSHINGS  02/09/2024   Procedure: BRONCHOSCOPY, WITH BRUSH BIOPSY;  Surgeon: Denson Flake, MD;  Location: [facility] ENDOSCOPY;  Service: Pulmonary;;   BRONCHIAL NEEDLE ASPIRATION BIOPSY  02/09/2024   Procedure: BRONCHOSCOPY, WITH NEEDLE ASPIRATION BIOPSY;  Surgeon: Denson Flake, MD;  Location: [facility] ENDOSCOPY;  Service: Pulmonary;;   BRONCHIAL WASHINGS  02/09/2024   Procedure: IRRIGATION, BRONCHUS;  Surgeon: Denson Flake, MD;  Location: [facility] ENDOSCOPY;  Service: Pulmonary;;   CARDIOVASCULAR STRESS TEST  01-06-2013  dr Ardell Koller   normal perfusion study/  no ischemia or infarct/  normal LV function and wall motion , ef 57%   CARDIOVERSION N/A 08/05/2016   Procedure: CARDIOVERSION;  Surgeon: Luana Rumple, MD;  Location: [facility] ENDOSCOPY;  Service: Cardiovascular;  Laterality: N/A;   CARDIOVERSION N/A 08/08/2016   Procedure: CARDIOVERSION;  Surgeon: Hugh Madura, MD;  Location: [facility] ENDOSCOPY;  Service: Cardiovascular;  Laterality: N/A;   CORONARY ANGIOPLASTY  11-13-2005 dr Grandville Lax   Successful touch up post-dilatation of the 3 tandem overlying Cypher stents in proximal to mid LAD (based on IVUS 0% stenosis and patent diagonal branch)/  Cutting Balloon Angioplasty of Ramus branch   CORONARY ANGIOPLASTY WITH STENT PLACEMENT  11-05-2005   dr Therese Flash brodie   PCI and DES x3 to birfurcation lesion LAD and diagonal branch of LAD/   20% dLM,  30-40% RCA, ef 60%   CYSTOSCOPY WITH RETROGRADE PYELOGRAM, URETEROSCOPY AND STENT PLACEMENT Right 07/14/2015   Procedure: CYSTOSCOPY WITH RETROGRADE PYELOGRAM, URETEROSCOPY AND STENT PLACEMENT;  Surgeon: Osborn Blaze, MD;  Location: [facility];  Service: Urology;  Laterality: Right;   ELECTROPHYSIOLOGIC STUDY N/A 10/24/2016   Procedure: Atrial Fibrillation Ablation;  Surgeon: Will Cortland Ding, MD;  Location: [facility] INVASIVE CV LAB;  Service: Cardiovascular;  Laterality: N/A;   EXTRACORPOREAL SHOCK WAVE LITHOTRIPSY  yrs ago   HEMORRHOID SURGERY N/A 10/24/2017   Procedure: HEMORRHOIDECTOMY;  Surgeon: Melvenia Stabs, MD;  Location: [facility] OR;  Service: General;  Laterality: N/A;   HERNIA REPAIR Bilateral 2000   Inguinal Hernia   IR GENERIC HISTORICAL  10/23/2016   IR FLUORO GUIDE CV LINE RIGHT 10/23/2016 Lucinda Saber, MD [facility]   IR GENERIC HISTORICAL  10/23/2016   IR US  GUIDE VASC ACCESS RIGHT 10/23/2016 Lucinda Saber, MD [facility]   LEFT URETEROSCOPIC STONE EXTRACTION  04/10/2006   STONE EXTRACTION WITH BASKET Right 07/14/2015   Procedure: STONE EXTRACTION WITH BASKET;  Surgeon: Osborn Blaze, MD;  Location: [facility];  Service: Urology;  Laterality: Right;   TEE WITHOUT CARDIOVERSION N/A 08/05/2016   Procedure: TRANSESOPHAGEAL ECHOCARDIOGRAM (TEE);  Surgeon: Luana Rumple, MD;  Location: [facility] ENDOSCOPY;  Service: Cardiovascular;  Laterality: N/A;   TOTAL HIP ARTHROPLASTY Right 07/16/2022   Procedure: RIGHT TOTAL HIP ARTHROPLASTY ANTERIOR APPROACH;  Surgeon: Wes Hamman, MD;  Location: [facility] OR;  Service: Orthopedics;  Laterality: Right;  3-C   TRANSTHORACIC ECHOCARDIOGRAM  02/04/2012   grade I diastolic dysfunction/  ef 55-60%/  trivial MR and TR/  mild LAE   VIDEO BRONCHOSCOPY WITH ENDOBRONCHIAL NAVIGATION Right 02/09/2024   Procedure: VIDEO BRONCHOSCOPY WITH ENDOBRONCHIAL NAVIGATION;  Surgeon: Denson Flake, MD;  Location: [facility] ENDOSCOPY;  Service: Pulmonary;  Laterality: Right;  WITH FLOURO    FAMILY HISTORY:  Family History  Problem Relation Age of Onset   Heart attack Father 22   Congestive Heart Failure Father 43   Cancer Paternal Uncle    Cancer Other     SOCIAL HISTORY:   Social History   Tobacco Use   Smoking status: Never    Passive exposure: Past   Smokeless tobacco: Never  Vaping Use   Vaping status: Never Used  Substance Use Topics   Alcohol  use: No    Comment: 1 beer a year   Drug use: No    ALLERGIES:  Allergies  Allergen Reactions   Prednisone  Other (See Comments)    Made BS increase to 500- discovered on 01/22/24   Spironolactone  Other (See Comments)    Tender breast    MEDICATIONS:  Current Outpatient Medications  Medication Sig Dispense Refill   albuterol  (VENTOLIN  HFA) 108 (90 Base) MCG/ACT inhaler Inhale 2 puffs into the lungs every 6 (six) hours as needed for wheezing or shortness of breath (intractable cough). 8 g 2   allopurinol  (ZYLOPRIM ) 100 MG tablet Take 100 mg by mouth at bedtime.     allopurinol  (ZYLOPRIM ) 300 MG tablet Take 300 mg by mouth in the morning.     atorvastatin  (LIPITOR) 40 MG tablet TAKE 1 TABLET EVERY DAY (NEED MD APPOINTMENT) 90 tablet 0   benzonatate  (TESSALON  PERLES) 100 MG capsule Take 2 capsules (200 mg total) by mouth 2 (two) times daily as needed for cough. 30 capsule 1   budeson-glycopyrrolate-formoterol (BREZTRI  AEROSPHERE) 160-9-4.8 MCG/ACT AERO Inhale 2 puffs into the lungs in the morning and at bedtime.     carvedilol  (COREG ) 25 MG tablet TAKE 1 TABLET TWICE DAILY WITH MEALS 180 tablet 0   Cyanocobalamin  (VITAMIN B-12 PO) Take 1 tablet by mouth daily.     diphenhydramine -acetaminophen  (TYLENOL  PM) 25-500 MG TABS tablet Take 1 tablet by mouth at bedtime.     ferrous sulfate  325 (65 FE) MG tablet Take 1 tablet (325 mg total) by mouth every morning. 90 tablet 2   furosemide  (LASIX ) 40 MG tablet TAKE 1 TABLET EVERY DAY (Patient taking differently: Take 20 mg by mouth daily.) 90 tablet 0   hydrocortisone  1 % lotion Apply 1 Application topically 2 (two) times daily. (Patient taking differently: Apply 1 Application topically as needed.) 118 mL 0   lisinopril  (ZESTRIL ) 40 MG tablet TAKE 1 TABLET EVERY DAY  90 tablet 3   nitroGLYCERIN  (NITROSTAT ) 0.4 MG SL tablet Place 1 tablet (0.4 mg total) under the tongue every 5 (five) minutes as needed for chest pain. X 3 doses 25 tablet 5   polyvinyl alcohol  (LIQUIFILM TEARS) 1.4 % ophthalmic solution Place 1 drop into both eyes daily as needed for dry eyes.  rivaroxaban  (XARELTO ) 20 MG TABS tablet Take 1 tablet (20 mg total) by mouth at bedtime. Okay to restart this medication on 02/10/24     sertraline  (ZOLOFT ) 25 MG tablet Take 25 mg by mouth daily.     triamcinolone  cream (KENALOG ) 0.1 % Apply 1 Application topically 2 (two) times daily. (Patient taking differently: Apply 1 Application topically as needed.) 453.6 g 0   No current facility-administered medications for this encounter.    REVIEW OF SYSTEMS:  A 10+ POINT REVIEW OF SYSTEMS WAS OBTAINED including neurology, dermatology, psychiatry, cardiac, respiratory, lymph, extremities, GI, GU, musculoskeletal, constitutional, reproductive, HEENT. ***   PHYSICAL EXAM:  vitals were not taken for this visit.   General: Alert and oriented, in no acute distress HEENT: Head is normocephalic. Extraocular movements are intact. Oropharynx is clear. Neck: Neck is supple, no palpable cervical or supraclavicular lymphadenopathy. Heart: Regular in rate and rhythm with no murmurs, rubs, or gallops. Chest: Clear to auscultation bilaterally, with no rhonchi, wheezes, or rales. Abdomen: Soft, nontender, nondistended, with no rigidity or guarding. Extremities: No cyanosis or edema. Lymphatics: see Neck Exam Skin: No concerning lesions. Musculoskeletal: symmetric strength and muscle tone throughout. Neurologic: Cranial nerves II through XII are grossly intact. No obvious focalities. Speech is fluent. Coordination is intact. Psychiatric: Judgment and insight are intact. Affect is appropriate. ***  ECOG = ***  0 - Asymptomatic (Fully active, able to carry on all predisease activities without restriction)  1 -  Symptomatic but completely ambulatory (Restricted in physically strenuous activity but ambulatory and able to carry out work of a light or sedentary nature. For example, light housework, office work)  2 - Symptomatic, <50% in bed during the day (Ambulatory and capable of all self care but unable to carry out any work activities. Up and about more than 50% of waking hours)  3 - Symptomatic, >50% in bed, but not bedbound (Capable of only limited self-care, confined to bed or chair 50% or more of waking hours)  4 - Bedbound (Completely disabled. Cannot carry on any self-care. Totally confined to bed or chair)  5 - Death   Aurea Blossom MM, Creech RH, Tormey DC, et al. [phone]). Toxicity and response criteria of the [facility]. Am. Hillard Lowes. Oncol. 5 (6): 649-55  LABORATORY DATA:  Lab Results  Component Value Date   WBC 5.8 04/28/2024   HGB 9.6 (L) 04/28/2024   HCT 28.3 (L) 04/28/2024   MCV 99.6 04/28/2024   PLT 148 (L) 04/28/2024   NEUTROABS 4.0 04/28/2024   Lab Results  Component Value Date   NA 140 04/28/2024   K 4.1 04/28/2024   CL 108 04/28/2024   CO2 25 04/28/2024   GLUCOSE 171 (H) 04/28/2024   BUN 16 04/28/2024   CREATININE 1.03 04/28/2024   CALCIUM  8.7 (L) 04/28/2024      RADIOGRAPHY: CT Angio Chest PE W and/or Wo Contrast Result Date: 04/08/2024 CLINICAL DATA:  Pulmonary embolism (PE) suspected, high prob History of lung cancer.  Weakness. EXAM: CT ANGIOGRAPHY CHEST WITH CONTRAST TECHNIQUE: Multidetector CT imaging of the chest was performed using the standard protocol during bolus administration of intravenous contrast. Multiplanar CT image reconstructions and MIPs were obtained to evaluate the vascular anatomy. RADIATION DOSE REDUCTION: This exam was performed according to the departmental dose-optimization program which includes automated exposure control, adjustment of the mA and/or kV according to patient size and/or use of iterative reconstruction  technique. CONTRAST:  75mL OMNIPAQUE  IOHEXOL  350 MG/ML SOLN COMPARISON:  Radiograph earlier  today. PET CT 02/23/2024. Chest CTA 01/22/2024 FINDINGS: Cardiovascular: There are no filling defects within the pulmonary arteries to suggest pulmonary embolus. Occlusion of the right upper lobe pulmonary arteries are chronic finding and unchanged from prior CTA. Mild cardiomegaly. There are coronary artery calcifications. Aortic atherosclerosis. Trace pericardial fluid. Mediastinum/Nodes: Moderate to large hiatal hernia again seen. Wall thickening of the distal esophagus, the upper esophagus is patulous. Again seen mild mediastinal adenopathy. This includes right paratracheal node measuring 14 mm. Right hilar soft tissue density which was hypermetabolic on prior PET is unchanged. Lungs/Pleura: Chronic right apical opacity with diminishing cavitary component from prior exam in contraction of the soft tissue density. Masslike opacity in the posterior right lower lobe with central cavitation has slightly contracted from prior PET. Nodular opacity in the right middle lobe, series 12 image 71, increasing. Chronic subpleural reticulation is again seen. Calcified granuloma in the right lung. No significant pleural effusion. Upper Abdomen: Moderate to large hiatal hernia. Gallstone. No acute upper abdominal findings. Musculoskeletal: Diffuse thoracic spondylosis. No evidence of focal bone lesion or acute osseous finding. Review of the MIP images confirms the above findings. IMPRESSION: 1. No pulmonary embolus. 2. Chronic right apical opacity with diminishing cavitary component and contraction of the soft tissue density. Masslike opacity in the posterior right lower lobe with central cavitation has slightly contracted from prior PET. 3. Nodular opacity in the right middle lobe, increasing from prior exam. This may be infectious/inflammatory or neoplastic. 4. Unchanged mild mediastinal adenopathy. Right hilar soft tissue density  which was hypermetabolic on prior PET is unchanged. 5. Moderate to large hiatal hernia. Wall thickening of the distal esophagus, can be seen with reflux. 6. Cholelithiasis. Aortic Atherosclerosis (ICD10-I70.0). Electronically Signed   By: Chadwick Colonel M.D.   On: 04/08/2024 20:02   DG Chest 2 View Result Date: 04/08/2024 CLINICAL DATA:  Shortness of breath EXAM: CHEST - 2 VIEW COMPARISON:  02/09/2024, 12/19/2023, chest CT 12/22/2023, PET CT 02/23/2024 FINDINGS: Low lung volumes. Masslike opacity in the right mid lung. Since prior chest radiograph, some resolution of cavitary and consolidative process in the right apical lung. Right hilar prominence may correspond to nodes. Perihilar streaky opacities which may be due to chronic lung disease and airways thickening. No pleural effusion or pneumothorax. Stable cardiomediastinal silhouette. IMPRESSION: Low lung volumes. Masslike opacity in the right mid lung, corresponding to known malignancy. Since prior chest radiograph, some resolution of cavitary and consolidative process in the right apical lung. Chronic lung disease and possible central airways thickening Electronically Signed   By: Esmeralda Hedge M.D.   On: 04/08/2024 18:05      IMPRESSION: Stage IIIA (T4, N1, M0) non-small cell lung cancer, adenocarcinoma of the RUL and RLL - presented with right lower lobe lung mass in addition to suspicious right upper lobe lesion and right hilar lymphadenopathy diagnosed in April 2025.   ***  Today, I talked to the patient and family about the findings and work-up thus far.  We discussed the natural history of *** and general treatment, highlighting the role of radiotherapy in the management.  We discussed the available radiation techniques, and focused on the details of logistics and delivery.  We reviewed the anticipated acute and late sequelae associated with radiation in this setting.  The patient was encouraged to ask questions that I answered to the best of  my ability. *** A patient consent form was discussed and signed.  We retained a copy for our records.  The patient would like to  proceed with radiation and will be scheduled for CT simulation.  PLAN: ***    *** minutes of total time was spent for this patient encounter, including preparation, face-to-face counseling with the patient and coordination of care, physical exam, and documentation of the encounter.   ------------------------------------------------  Noralee Beam, PhD, MD  This document serves as a record of services personally performed by Retta Caster, MD. It was created on his behalf by Aleta Anda, a trained medical scribe. The creation of this record is based on the scribe's personal observations and the provider's statements to them. This document has been checked and approved by the attending provider.

## 2024-05-03 ENCOUNTER — Ambulatory Visit
Admission: RE | Admit: 2024-05-03 | Discharge: 2024-05-03 | Disposition: A | Discharge status: Home / Self Care | Source: Ambulatory Visit | Attending: Radiation Oncology | Admitting: Radiation Oncology

## 2024-05-03 ENCOUNTER — Encounter: Payer: Self-pay | Admitting: Radiation Oncology

## 2024-05-03 VITALS — BP 145/88 | HR 81 | Temp 97.8°F | Resp 20 | Ht 68.0 in | Wt 211.8 lb

## 2024-05-03 DIAGNOSIS — I2510 Atherosclerotic heart disease of native coronary artery without angina pectoris: Secondary | ICD-10-CM | POA: Insufficient documentation

## 2024-05-03 DIAGNOSIS — K219 Gastro-esophageal reflux disease without esophagitis: Secondary | ICD-10-CM | POA: Diagnosis not present

## 2024-05-03 DIAGNOSIS — C3431 Malignant neoplasm of lower lobe, right bronchus or lung: Secondary | ICD-10-CM

## 2024-05-03 DIAGNOSIS — M129 Arthropathy, unspecified: Secondary | ICD-10-CM | POA: Diagnosis not present

## 2024-05-03 DIAGNOSIS — R0602 Shortness of breath: Secondary | ICD-10-CM | POA: Diagnosis not present

## 2024-05-03 DIAGNOSIS — I252 Old myocardial infarction: Secondary | ICD-10-CM | POA: Diagnosis not present

## 2024-05-03 DIAGNOSIS — I4891 Unspecified atrial fibrillation: Secondary | ICD-10-CM | POA: Insufficient documentation

## 2024-05-03 DIAGNOSIS — M47814 Spondylosis without myelopathy or radiculopathy, thoracic region: Secondary | ICD-10-CM | POA: Diagnosis not present

## 2024-05-03 DIAGNOSIS — E785 Hyperlipidemia, unspecified: Secondary | ICD-10-CM | POA: Diagnosis not present

## 2024-05-03 DIAGNOSIS — I5042 Chronic combined systolic (congestive) and diastolic (congestive) heart failure: Secondary | ICD-10-CM | POA: Diagnosis not present

## 2024-05-03 DIAGNOSIS — I700 Atherosclerosis of aorta: Secondary | ICD-10-CM | POA: Insufficient documentation

## 2024-05-03 DIAGNOSIS — R0609 Other forms of dyspnea: Secondary | ICD-10-CM | POA: Insufficient documentation

## 2024-05-03 DIAGNOSIS — R21 Rash and other nonspecific skin eruption: Secondary | ICD-10-CM | POA: Diagnosis not present

## 2024-05-03 DIAGNOSIS — D61818 Other pancytopenia: Secondary | ICD-10-CM | POA: Diagnosis not present

## 2024-05-03 DIAGNOSIS — G4730 Sleep apnea, unspecified: Secondary | ICD-10-CM | POA: Insufficient documentation

## 2024-05-03 DIAGNOSIS — I130 Hypertensive heart and chronic kidney disease with heart failure and stage 1 through stage 4 chronic kidney disease, or unspecified chronic kidney disease: Secondary | ICD-10-CM | POA: Diagnosis not present

## 2024-05-03 DIAGNOSIS — R590 Localized enlarged lymph nodes: Secondary | ICD-10-CM | POA: Diagnosis not present

## 2024-05-03 DIAGNOSIS — Z79899 Other long term (current) drug therapy: Secondary | ICD-10-CM | POA: Insufficient documentation

## 2024-05-03 DIAGNOSIS — K449 Diaphragmatic hernia without obstruction or gangrene: Secondary | ICD-10-CM | POA: Insufficient documentation

## 2024-05-04 ENCOUNTER — Ambulatory Visit: Admitting: Internal Medicine

## 2024-05-04 ENCOUNTER — Other Ambulatory Visit

## 2024-05-04 ENCOUNTER — Other Ambulatory Visit: Payer: Self-pay

## 2024-05-04 ENCOUNTER — Ambulatory Visit

## 2024-05-05 ENCOUNTER — Other Ambulatory Visit

## 2024-05-06 DIAGNOSIS — C3431 Malignant neoplasm of lower lobe, right bronchus or lung: Secondary | ICD-10-CM | POA: Diagnosis not present

## 2024-05-07 NOTE — Progress Notes (Unsigned)
 [facility] OFFICE PROGRESS NOTE  Koirala, [name], MD [address] [place] [address]  DIAGNOSIS: Stage IIIA (T4, N1, M0) non-small cell lung cancer, adenocarcinoma presented with right lower lobe lung mass in addition to suspicious right upper lobe lesion and right hilar lymphadenopathy diagnosed in April 2025.    Biomarker Findings HRD signature - Cannot Be Determined Microsatellite status - Cannot Be Determined ? Tumor Mutational Burden - Cannot Be Determined Genomic Findings For a complete list of the genes assayed, please refer to the Appendix. AKT2 amplification CDKN2A p16INK4a R80* and p14ARF P94L KRAS G12D ASXL1 W796fs*22 GNAS R201H U2AF1 S34F 7 Disease relevant genes with no reportable alterations: ALK, BRAF, EGFR, ERBB2, MET, RET, ROS1   PDL1 TPS: 0%  PRIOR THERAPY:  Neoadjuvant systemic chemotherapy with carboplatin  for AUC of 5, Alimta 500 Mg/M2 and Keytruda  200 Mg IV every 3 weeks.  Status post 1 cycle.  This was discontinued secondary to intolerance.    CURRENT THERAPY: A course of concurrent chemoradiation with weekly carboplatin  for AUC of 2 and paclitaxel 45 Mg/M2.  First dose dose 05/10/24.    INTERVAL HISTORY: Peter [name] 78 y.o. male returns to the clinic today for a follow-up visit.  The patient was last seen in the clinic by Dr. Sherrod on 04/28/2024.  The patient was recently diagnosed with lung cancer.  The initial plan was to undergo 3 cycles of neoadjuvant chemotherapy and immunotherapy.  The patient had significant intolerance after cycle #1 with significant pancytopenia, rash, and weakness.   Therefore, his treatment was discontinued and the plan was to undergo concurrent chemoradiation. He is ready to start treatment today. The patient met with Dr. Shannon on 05/03/24.  The patient is scheduled to start concurrent chemoradiation today.  Overall he is starting to feel fairly well. His last day radiation is tentatively  scheduled for 06/25/24.   His main question today is related to getting a handicap placard due to his dyspnea on exertion. He also was previously tested for supplemental oxygen and did not qualify. He states after exertion the other day his oxygen was 86% and he used his CPAP machine which improved his oxygen. He is wondering if he can be re-tested. His dyspnea on exertion is stable compared to when he was last here. No cough, hemoptysis, or chest pain.  The patient denies any fever, chills, night sweats, nausea, vomiting, diarrhea, or constipation.  His appetite is fair. He has been gaining some weight back. He is under the care of pulmonologist.    He is currently taking carvedilol  in the morning and at night, and a half dose of Lasix . His lisinopril  has been held off temporarily. He monitors his blood pressure regularly and reports it as stable. He has noticed occasional readings of irregular heartbeat on his new blood pressure machine, but these are not consistent. He has a history of atrial fibrillation.   He is here today for evaluation repeat blood work before undergoing cycle #1.   MEDICAL HISTORY: Past Medical History:  Diagnosis Date   Arthritis    At risk for sleep apnea    STOP-BANG= 5       SENT TO PCP 07-12-2015   CAD (coronary artery disease)    a. s/p PCI with DES x 3 to LAD in 2006 with bifurcation lesion and Plavix  indefinitely was recommended.   Chronic combined systolic and diastolic CHF (congestive heart failure) (HCC)    a. Prior EF normal but EF  dropped to 30-35% in 07/2016 in setting of AFIB.   CKD (chronic kidney disease), stage III (HCC)    Depression    Dyslipidemia    Dyspnea    tired quick d/t Afib   Dyspnea on exertion    Dysrhythmia    A.FIB   GERD (gastroesophageal reflux disease)    History of GI bleed    History of kidney stones    History of MI (myocardial infarction)    11-05-2005--  periprocedural MI  (cardiac cath) per dr obie note   Hypertension     Hypertriglyceridemia    Iron deficiency anemia    Myocardial infarction [facility])    Persistent atrial fibrillation (HCC)    a. 07/2016, started on Eliquis  -> readmitted later that month for AF RVR/acute HF - initially unsuccessful DCCV following TEE, so loaded with amio with repeat successful DCCV 07/2816.   Right ureteral stone    S/P drug eluting coronary stent placement    x3  to LAD  2006   Sleep apnea    wears BIPAP, 14 up to 24    ALLERGIES:  is allergic to prednisone  and spironolactone .  MEDICATIONS:  Current Outpatient Medications  Medication Sig Dispense Refill   allopurinol  (ZYLOPRIM ) 100 MG tablet Take 100 mg by mouth at bedtime.     allopurinol  (ZYLOPRIM ) 300 MG tablet Take 300 mg by mouth in the morning.     atorvastatin  (LIPITOR) 40 MG tablet TAKE 1 TABLET EVERY DAY (NEED MD APPOINTMENT) 90 tablet 0   Cyanocobalamin  (VITAMIN B-12 PO) Take 1 tablet by mouth daily.     diphenhydramine -acetaminophen  (TYLENOL  PM) 25-500 MG TABS tablet Take 1 tablet by mouth at bedtime.     ferrous sulfate  325 (65 FE) MG tablet Take 1 tablet (325 mg total) by mouth every morning. 90 tablet 2   furosemide  (LASIX ) 40 MG tablet TAKE 1 TABLET EVERY DAY 90 tablet 0   lisinopril  (ZESTRIL ) 40 MG tablet TAKE 1 TABLET EVERY DAY (Patient taking differently: Take 40 mg by mouth daily. Taking 1/2 tablet a day) 90 tablet 3   nitroGLYCERIN  (NITROSTAT ) 0.4 MG SL tablet Place 1 tablet (0.4 mg total) under the tongue every 5 (five) minutes as needed for chest pain. X 3 doses 25 tablet 5   polyvinyl alcohol  (LIQUIFILM TEARS) 1.4 % ophthalmic solution Place 1 drop into both eyes daily as needed for dry eyes.     rivaroxaban  (XARELTO ) 20 MG TABS tablet Take 1 tablet (20 mg total) by mouth at bedtime. Okay to restart this medication on 02/10/24     sertraline  (ZOLOFT ) 25 MG tablet Take 25 mg by mouth daily.     albuterol  (VENTOLIN  HFA) 108 (90 Base) MCG/ACT inhaler Inhale 2 puffs into the lungs every 6 (six) hours as  needed for wheezing or shortness of breath (intractable cough). (Patient not taking: Reported on 05/10/2024) 8 g 2   benzonatate  (TESSALON  PERLES) 100 MG capsule Take 2 capsules (200 mg total) by mouth 2 (two) times daily as needed for cough. (Patient not taking: Reported on 05/10/2024) 30 capsule 1   budeson-glycopyrrolate-formoterol (BREZTRI  AEROSPHERE) 160-9-4.8 MCG/ACT AERO Inhale 2 puffs into the lungs in the morning and at bedtime. (Patient not taking: Reported on 05/10/2024)     carvedilol  (COREG ) 25 MG tablet TAKE 1 TABLET TWICE DAILY WITH MEALS 180 tablet 0   hydrocortisone  1 % lotion Apply 1 Application topically 2 (two) times daily. (Patient not taking: Reported on 05/10/2024) 118 mL 0   triamcinolone   cream (KENALOG ) 0.1 % Apply 1 Application topically 2 (two) times daily. (Patient not taking: Reported on 05/10/2024) 453.6 g 0   No current facility-administered medications for this visit.    SURGICAL HISTORY:  Past Surgical History:  Procedure Laterality Date   BRONCHIAL BIOPSY  02/09/2024   Procedure: BRONCHOSCOPY, WITH BIOPSY;  Surgeon: Shelah Lamar RAMAN, MD;  Location: [facility] ENDOSCOPY;  Service: Pulmonary;;   BRONCHIAL BRUSHINGS  02/09/2024   Procedure: BRONCHOSCOPY, WITH BRUSH BIOPSY;  Surgeon: Shelah Lamar RAMAN, MD;  Location: [facility] ENDOSCOPY;  Service: Pulmonary;;   BRONCHIAL NEEDLE ASPIRATION BIOPSY  02/09/2024   Procedure: BRONCHOSCOPY, WITH NEEDLE ASPIRATION BIOPSY;  Surgeon: Shelah Lamar RAMAN, MD;  Location: [facility] ENDOSCOPY;  Service: Pulmonary;;   BRONCHIAL WASHINGS  02/09/2024   Procedure: IRRIGATION, BRONCHUS;  Surgeon: Shelah Lamar RAMAN, MD;  Location: [facility] ENDOSCOPY;  Service: Pulmonary;;   CARDIOVASCULAR STRESS TEST  01-06-2013  dr micky   normal perfusion study/  no ischemia or infarct/  normal LV function and wall motion , ef 57%   CARDIOVERSION N/A 08/05/2016   Procedure: CARDIOVERSION;  Surgeon: Jerel Balding, MD;  Location: [facility] ENDOSCOPY;  Service: Cardiovascular;  Laterality: N/A;    CARDIOVERSION N/A 08/08/2016   Procedure: CARDIOVERSION;  Surgeon: Oneil JAYSON Parchment, MD;  Location[facility] ENDOSCOPY;  Service: Cardiovascular;  Laterality: N/A;   CORONARY ANGIOPLASTY  11-13-2005 dr obie   Successful touch up post-dilatation of the 3 tandem overlying Cypher stents in proximal to mid LAD (based on IVUS 0% stenosis and patent diagonal branch)/  Cutting Balloon Angioplasty of Ramus branch   CORONARY ANGIOPLASTY WITH STENT PLACEMENT  11-05-2005   dr wolm brodie   PCI and DES x3 to birfurcation lesion LAD and diagonal branch of LAD/   20% dLM,  30-40% RCA, ef 60%   CYSTOSCOPY WITH RETROGRADE PYELOGRAM, URETEROSCOPY AND STENT PLACEMENT Right 07/14/2015   Procedure: CYSTOSCOPY WITH RETROGRADE PYELOGRAM, URETEROSCOPY AND STENT PLACEMENT;  Surgeon: Ricardo Likens, MD;  Location: [facility];  Service: Urology;  Laterality: Right;   ELECTROPHYSIOLOGIC STUDY N/A 10/24/2016   Procedure: Atrial Fibrillation Ablation;  Surgeon: Will Gladis Norton, MD;  Location: [facility] INVASIVE CV LAB;  Service: Cardiovascular;  Laterality: N/A;   EXTRACORPOREAL SHOCK WAVE LITHOTRIPSY  yrs ago   HEMORRHOID SURGERY N/A 10/24/2017   Procedure: HEMORRHOIDECTOMY;  Surgeon: Teresa Lonni HERO, MD;  Location: [facility] OR;  Service: General;  Laterality: N/A;   HERNIA REPAIR Bilateral 2000   Inguinal Hernia   IR GENERIC HISTORICAL  10/23/2016   IR FLUORO GUIDE CV LINE RIGHT 10/23/2016 Ozell Specking, MD [facility]-INTER[facility]   IR GENERIC HISTORICAL  10/23/2016   IR US  GUIDE VASC ACCESS RIGHT 10/23/2016 Ozell Specking, MD [facility]   LEFT URETEROSCOPIC STONE EXTRACTION  04/10/2006   STONE EXTRACTION WITH BASKET Right 07/14/2015   Procedure: STONE EXTRACTION WITH BASKET;  Surgeon: Ricardo Likens, MD;  Location: [facility];  Service: Urology;  Laterality: Right;   TEE WITHOUT CARDIOVERSION N/A 08/05/2016   Procedure: TRANSESOPHAGEAL ECHOCARDIOGRAM (TEE);  Surgeon: Jerel Balding, MD;  Location: [facility]  ENDOSCOPY;  Service: Cardiovascular;  Laterality: N/A;   TOTAL HIP ARTHROPLASTY Right 07/16/2022   Procedure: RIGHT TOTAL HIP ARTHROPLASTY ANTERIOR APPROACH;  Surgeon: Jerri Kay HERO, MD;  Location: [facility] OR;  Service: Orthopedics;  Laterality: Right;  3-C   TRANSTHORACIC ECHOCARDIOGRAM  02/04/2012   grade I diastolic dysfunction/  ef 55-60%/  trivial MR and TR/  mild LAE   VIDEO BRONCHOSCOPY WITH ENDOBRONCHIAL NAVIGATION Right 02/09/2024  Procedure: VIDEO BRONCHOSCOPY WITH ENDOBRONCHIAL NAVIGATION;  Surgeon: Shelah Lamar RAMAN, MD;  Location: [facility] ENDOSCOPY;  Service: Pulmonary;  Laterality: Right;  WITH FLOURO    REVIEW OF SYSTEMS:   Review of Systems  Constitutional: Positive for fatigue. Negative for appetite change, chills,fever and unexpected weight change.  HENT:  Negative for mouth sores, nosebleeds, sore throat and trouble swallowing.   Eyes: Negative for eye problems and icterus.  Respiratory: Positive for dyspnea on exertion. Negative for cough, hemoptysis,  and wheezing.   Cardiovascular: Negative for chest pain and leg swelling.  Gastrointestinal: Negative for abdominal pain, constipation, diarrhea, nausea and vomiting.  Genitourinary: Negative for bladder incontinence, difficulty urinating, dysuria, frequency and hematuria.   Musculoskeletal: Negative for back pain, gait problem, neck pain and neck stiffness.  Skin: Improving rash and dry skin.  Neurological: Negative for dizziness, extremity weakness, gait problem, headaches, light-headedness and seizures.  Hematological: Negative for adenopathy. Does not bruise/bleed easily.  Psychiatric/Behavioral: Negative for confusion, depression and sleep disturbance. The patient is not nervous/anxious.     PHYSICAL EXAMINATION:  Blood pressure 122/69, pulse 77, temperature (!) 97.4 F (36.3 C), temperature source Temporal, resp. rate 14, weight 215 lb 4.8 oz (97.7 kg), SpO2 96%.  ECOG PERFORMANCE STATUS: 2  Physical Exam  Constitutional:  Oriented to person, place, and time and well-developed, well-nourished, and in no distress.  HENT:  Head: Normocephalic and atraumatic.  Mouth/Throat: Oropharynx is clear and moist. No oropharyngeal exudate.  Eyes: Conjunctivae are normal. Right eye exhibits no discharge. Left eye exhibits no discharge. No scleral icterus.  Neck: Normal range of motion. Neck supple.  Cardiovascular: Normal rate, regular rhythm, normal heart sounds and intact distal pulses.   Pulmonary/Chest: Effort normal and breath sounds normal. No respiratory distress. No wheezes. No rales.  Abdominal: Soft. Bowel sounds are normal. Exhibits no distension and no mass. There is no tenderness.  Musculoskeletal: Normal range of motion. Exhibits no edema.  Lymphadenopathy:    No cervical adenopathy.  Neurological: Alert and oriented to person, place, and time. Exhibits normal muscle tone.  Examined in the wheelchair. Skin: Skin is warm and dry.  Positive for improving rash on arms. Not diaphoretic. No erythema. No pallor.  Psychiatric: Mood, memory and judgment normal.  Vitals reviewed.   LABORATORY DATA: Lab Results  Component Value Date   WBC 7.6 05/10/2024   HGB 10.2 (L) 05/10/2024   HCT 30.9 (L) 05/10/2024   MCV 101.6 (H) 05/10/2024   PLT 149 (L) 05/10/2024      Chemistry      Component Value Date/Time   NA 141 05/10/2024 0929   K 4.1 05/10/2024 0929   CL 109 05/10/2024 0929   CO2 22 05/10/2024 0929   BUN 17 05/10/2024 0929   CREATININE 1.09 05/10/2024 0929   CREATININE 1.72 (H) 10/16/2016 1115      Component Value Date/Time   CALCIUM  9.1 05/10/2024 0929   ALKPHOS 95 05/10/2024 0929   AST 20 05/10/2024 0929   ALT 16 05/10/2024 0929   BILITOT 0.6 05/10/2024 0929       RADIOGRAPHIC STUDIES:  No results found.    ASSESSMENT/PLAN:  This is a very pleasant 78 year old Caucasian male with stage III aT4, N1, M0) non-small cell lung cancer, adenocarcinoma.  He presented with a right lower lobe lung  mass in addition to suspicious right upper lobe lesion and right hilar lymphadenopathy.  He was diagnosed in April 2025.   His molecular studies show he has no actionable mutations and his  PD-L1 expression was negative.   He was on neoadjuvant chemoimmunotherapy with carboplatin  for an AUC of 5, Alimta 500 mg/m, and Keytruda  200 mg IV every 3 weeks.  He status post 1 cycle and experience a significant rash which may be from his immunotherapy and cytopenias from his chemotherapy which required G-CSF injections.  He also had significant thrombocytopenia.  Therefore his treatment was discontinued due to severe intolerance.  The patient is currently undergoing a course of concurrent chemoradiation with carboplatin  for AUC of 2 and paclitaxel 45 mg/m.  His first dose is expected today on 05/10/2024.  Labs were reviewed.  Recommend that he proceed with cycle #1 today as scheduled.  We will see him back in 2 weeks for evaluation and repeat blood work before undergoing cycle #3.  He was checked today for supplemental oxygen. He did not qualify. His lowest oxygen reading was 91%.   We discussed indications for transfusions for anemia. We also discussed parameters for chemotherapy with his labs based on his history of cytopenia's. Hopefully he will not have as significant cytopenias as his doses of chemotherapy are lower.   I provided him with a form to obtain a handicap placard.   The patient was advised to call immediately if he has any concerning symptoms in the interval. The patient voices understanding of current disease status and treatment options and is in agreement with the current care plan. All questions were answered. The patient knows to call the clinic with any problems, questions or concerns. We can certainly see the patient much sooner if necessary      No orders of the defined types were placed in this encounter.   The total time spent in the appointment was 20-29  mintues  Drewey Begue L Tryston Gilliam, PA-C 05/10/24

## 2024-05-10 ENCOUNTER — Ambulatory Visit: Admitting: Radiation Oncology

## 2024-05-10 ENCOUNTER — Inpatient Hospital Stay (HOSPITAL_BASED_OUTPATIENT_CLINIC_OR_DEPARTMENT_OTHER): Admitting: Physician Assistant

## 2024-05-10 ENCOUNTER — Inpatient Hospital Stay

## 2024-05-10 VITALS — BP 139/84 | HR 73 | Temp 98.8°F | Resp 16

## 2024-05-10 VITALS — BP 122/69 | HR 77 | Temp 97.4°F | Resp 14 | Wt 215.3 lb

## 2024-05-10 DIAGNOSIS — Z5111 Encounter for antineoplastic chemotherapy: Secondary | ICD-10-CM | POA: Diagnosis not present

## 2024-05-10 DIAGNOSIS — C3431 Malignant neoplasm of lower lobe, right bronchus or lung: Secondary | ICD-10-CM

## 2024-05-10 DIAGNOSIS — D509 Iron deficiency anemia, unspecified: Secondary | ICD-10-CM | POA: Diagnosis not present

## 2024-05-10 DIAGNOSIS — D61810 Antineoplastic chemotherapy induced pancytopenia: Secondary | ICD-10-CM | POA: Diagnosis not present

## 2024-05-10 DIAGNOSIS — R21 Rash and other nonspecific skin eruption: Secondary | ICD-10-CM | POA: Diagnosis not present

## 2024-05-10 LAB — CMP (CANCER CENTER ONLY)
ALT: 16 U/L (ref 0–44)
AST: 20 U/L (ref 15–41)
Albumin: 3.8 g/dL (ref 3.5–5.0)
Alkaline Phosphatase: 95 U/L (ref 38–126)
Anion gap: 10 (ref 5–15)
BUN: 17 mg/dL (ref 8–23)
CO2: 22 mmol/L (ref 22–32)
Calcium: 9.1 mg/dL (ref 8.9–10.3)
Chloride: 109 mmol/L (ref 98–111)
Creatinine: 1.09 mg/dL (ref 0.61–1.24)
GFR, Estimated: >60 mL/min (ref >60)
Glucose, Bld: 198 mg/dL — ABNORMAL HIGH (ref 70–99)
Potassium: 4.1 mmol/L (ref 3.5–5.1)
Sodium: 141 mmol/L (ref 135–145)
Total Bilirubin: 0.6 mg/dL (ref 0.0–1.2)
Total Protein: 6.4 g/dL — ABNORMAL LOW (ref 6.5–8.1)

## 2024-05-10 LAB — CBC WITH DIFFERENTIAL (CANCER CENTER ONLY)
Abs Immature Granulocytes: 0.05 10*3/uL (ref 0.00–0.07)
Basophils Absolute: 0 10*3/uL (ref 0.0–0.1)
Basophils Relative: 0 %
Eosinophils Absolute: 0 10*3/uL (ref 0.0–0.5)
Eosinophils Relative: 0 %
HCT: 30.9 % — ABNORMAL LOW (ref 39.0–52.0)
Hemoglobin: 10.2 g/dL — ABNORMAL LOW (ref 13.0–17.0)
Immature Granulocytes: 1 %
Lymphocytes Relative: 16 %
Lymphs Abs: 1.3 10*3/uL (ref 0.7–4.0)
MCH: 33.6 pg (ref 26.0–34.0)
MCHC: 33 g/dL (ref 30.0–36.0)
MCV: 101.6 fL — ABNORMAL HIGH (ref 80.0–100.0)
Monocytes Absolute: 0.6 10*3/uL (ref 0.1–1.0)
Monocytes Relative: 8 %
Neutro Abs: 5.8 10*3/uL (ref 1.7–7.7)
Neutrophils Relative %: 75 %
Platelet Count: 149 10*3/uL — ABNORMAL LOW (ref 150–400)
RBC: 3.04 MIL/uL — ABNORMAL LOW (ref 4.22–5.81)
RDW: 16.7 % — ABNORMAL HIGH (ref 11.5–15.5)
WBC Count: 7.6 10*3/uL (ref 4.0–10.5)
nRBC: 0.4 % — ABNORMAL HIGH (ref 0.0–0.2)

## 2024-05-10 LAB — SAMPLE TO BLOOD BANK

## 2024-05-10 MED ORDER — FAMOTIDINE IN NACL 20-0.9 MG/50ML-% IV SOLN
20.0000 mg | Freq: Once | INTRAVENOUS | Status: AC
  Administered 2024-05-10: 20 mg via INTRAVENOUS
  Filled 2024-05-10: qty 50

## 2024-05-10 MED ORDER — DIPHENHYDRAMINE HCL 50 MG/ML IJ SOLN
50.0000 mg | Freq: Once | INTRAMUSCULAR | Status: AC
  Administered 2024-05-10: 50 mg via INTRAVENOUS
  Filled 2024-05-10: qty 1

## 2024-05-10 MED ORDER — SODIUM CHLORIDE 0.9 % IV SOLN
207.2000 mg | Freq: Once | INTRAVENOUS | Status: AC
  Administered 2024-05-10: 210 mg via INTRAVENOUS
  Filled 2024-05-10: qty 21

## 2024-05-10 MED ORDER — PALONOSETRON HCL INJECTION 0.25 MG/5ML
0.2500 mg | Freq: Once | INTRAVENOUS | Status: AC
  Administered 2024-05-10: 0.25 mg via INTRAVENOUS
  Filled 2024-05-10: qty 5

## 2024-05-10 MED ORDER — SODIUM CHLORIDE 0.9 % IV SOLN: INTRAVENOUS | Status: DC

## 2024-05-10 MED ORDER — DEXAMETHASONE SODIUM PHOSPHATE 10 MG/ML IJ SOLN
10.0000 mg | Freq: Once | INTRAMUSCULAR | Status: AC
  Administered 2024-05-10: 10 mg via INTRAVENOUS
  Filled 2024-05-10: qty 1

## 2024-05-10 MED ORDER — SODIUM CHLORIDE 0.9 % IV SOLN
45.0000 mg/m2 | Freq: Once | INTRAVENOUS | Status: AC
  Administered 2024-05-10: 96 mg via INTRAVENOUS
  Filled 2024-05-10: qty 16

## 2024-05-10 NOTE — Patient Instructions (Addendum)
 [facility] - A DEPT OF [facility]  Discharge Instructions: Thank you for choosing [facility] to provide your oncology and hematology care.   If you have a lab appointment with the Cancer Center, please go directly to the Cancer Center and check in at the registration area.   Wear comfortable clothing and clothing appropriate for easy access to any Portacath or PICC line.   We strive to give you quality time with your provider. You may need to reschedule your appointment if you arrive late (15 or more minutes).  Arriving late affects you and other patients whose appointments are after yours.  Also, if you miss three or more appointments without notifying the office, you may be dismissed from the clinic at the provider's discretion.      For prescription refill requests, have your pharmacy contact our office and allow 72 hours for refills to be completed.    Today you received the following chemotherapy and/or immunotherapy agents: Taxol & Carboplatin       To help prevent nausea and vomiting after your treatment, we encourage you to take your nausea medication as directed.  BELOW ARE SYMPTOMS THAT SHOULD BE REPORTED IMMEDIATELY: *FEVER GREATER THAN 100.4 F (38 C) OR HIGHER *CHILLS OR SWEATING *NAUSEA AND VOMITING THAT IS NOT CONTROLLED WITH YOUR NAUSEA MEDICATION *UNUSUAL SHORTNESS OF BREATH *UNUSUAL BRUISING OR BLEEDING *URINARY PROBLEMS (pain or burning when urinating, or frequent urination) *BOWEL PROBLEMS (unusual diarrhea, constipation, pain near the anus) TENDERNESS IN MOUTH AND THROAT WITH OR WITHOUT PRESENCE OF ULCERS (sore throat, sores in mouth, or a toothache) UNUSUAL RASH, SWELLING OR PAIN  UNUSUAL VAGINAL DISCHARGE OR ITCHING   Items with * indicate a potential emergency and should be followed up as soon as possible or go to the Emergency Department if any problems should occur.  Please show the CHEMOTHERAPY ALERT CARD or  IMMUNOTHERAPY ALERT CARD at check-in to the Emergency Department and triage nurse.  Should you have questions after your visit or need to cancel or reschedule your appointment, please contact [facility] - A DEPT OF [facility]  Dept: [phone]  and follow the prompts.  Office hours are 8:00 a.m. to 4:30 p.m. Monday - Friday. Please note that voicemails left after 4:00 p.m. may not be returned until the following business day.  We are closed weekends and major holidays. You have access to a nurse at all times for urgent questions. Please call the main number to the clinic Dept: [phone] and follow the prompts.   For any non-urgent questions, you may also contact your provider using MyChart. We now offer e-Visits for anyone 56 and older to request care online for non-urgent symptoms. For details visit [email or web address].   Also download the MyChart app! Go to the app store, search MyChart, open the app, select [place], and log in with your MyChart username and password.  Paclitaxel Injection What is this medication? PACLITAXEL (PAK li TAX el) treats some types of cancer. It works by slowing down the growth of cancer cells. This medicine may be used for other purposes; ask your health care provider or pharmacist if you have questions. COMMON BRAND NAME(S): Onxol, Taxol What should I tell my care team before I take this medication? They need to know if you have any of these conditions: Heart disease Liver disease Low white blood cell levels An unusual or allergic reaction to paclitaxel, other  medications, foods, dyes, or preservatives If you or your partner are pregnant or trying to get pregnant Breast-feeding How should I use this medication? This medication is injected into a vein. It is given by your care team in a hospital or clinic setting. Talk to your care team about the use of this medication in children. While it may be given to children  for selected conditions, precautions do apply. Overdosage: If you think you have taken too much of this medicine contact a poison control center or emergency room at once. NOTE: This medicine is only for you. Do not share this medicine with others. What if I miss a dose? Keep appointments for follow-up doses. It is important not to miss your dose. Call your care team if you are unable to keep an appointment. What may interact with this medication? Do not take this medication with any of the following: Live virus vaccines Other medications may affect the way this medication works. Talk with your care team about all of the medications you take. They may suggest changes to your treatment plan to lower the risk of side effects and to make sure your medications work as intended. This list may not describe all possible interactions. Give your health care provider a list of all the medicines, herbs, non-prescription drugs, or dietary supplements you use. Also tell them if you smoke, drink alcohol , or use illegal drugs. Some items may interact with your medicine. What should I watch for while using this medication? Your condition will be monitored carefully while you are receiving this medication. You may need blood work while taking this medication. This medication may make you feel generally unwell. This is not uncommon as chemotherapy can affect healthy cells as well as cancer cells. Report any side effects. Continue your course of treatment even though you feel ill unless your care team tells you to stop. This medication can cause serious allergic reactions. To reduce the risk, your care team may give you other medications to take before receiving this one. Be sure to follow the directions from your care team. This medication may increase your risk of getting an infection. Call your care team for advice if you get a fever, chills, sore throat, or other symptoms of a cold or flu. Do not treat yourself. Try  to avoid being around people who are sick. This medication may increase your risk to bruise or bleed. Call your care team if you notice any unusual bleeding. Be careful brushing or flossing your teeth or using a toothpick because you may get an infection or bleed more easily. If you have any dental work done, tell your dentist you are receiving this medication. Talk to your care team if you may be pregnant. Serious birth defects can occur if you take this medication during pregnancy. Talk to your care team before breastfeeding. Changes to your treatment plan may be needed. What side effects may I notice from receiving this medication? Side effects that you should report to your care team as soon as possible: Allergic reactions--skin rash, itching, hives, swelling of the face, lips, tongue, or throat Heart rhythm changes--fast or irregular heartbeat, dizziness, feeling faint or lightheaded, chest pain, trouble breathing Increase in blood pressure Infection--fever, chills, cough, sore throat, wounds that don't heal, pain or trouble when passing urine, general feeling of discomfort or being unwell Low blood pressure--dizziness, feeling faint or lightheaded, blurry vision Low red blood cell level--unusual weakness or fatigue, dizziness, headache, trouble breathing Painful swelling, warmth, or  redness of the skin, blisters or sores at the infusion site Pain, tingling, or numbness in the hands or feet Slow heartbeat--dizziness, feeling faint or lightheaded, confusion, trouble breathing, unusual weakness or fatigue Unusual bruising or bleeding Side effects that usually do not require medical attention (report to your care team if they continue or are bothersome): Diarrhea Hair loss Joint pain Loss of appetite Muscle pain Nausea Vomiting This list may not describe all possible side effects. Call your doctor for medical advice about side effects. You may report side effects to FDA at  1-800-FDA-1088. Where should I keep my medication? This medication is given in a hospital or clinic. It will not be stored at home. NOTE: This sheet is a summary. It may not cover all possible information. If you have questions about this medicine, talk to your doctor, pharmacist, or health care provider.  2024 Elsevier/Gold Standard (2022-03-19 00:00:00)   Carboplatin  Injection What is this medication? CARBOPLATIN  (KAR boe pla tin) treats some types of cancer. It works by slowing down the growth of cancer cells. This medicine may be used for other purposes; ask your health care provider or pharmacist if you have questions. COMMON BRAND NAME(S): Paraplatin  What should I tell my care team before I take this medication? They need to know if you have any of these conditions: Blood disorders Hearing problems Kidney disease Recent or ongoing radiation therapy An unusual or allergic reaction to carboplatin , cisplatin, other medications, foods, dyes, or preservatives Pregnant or trying to get pregnant Breast-feeding How should I use this medication? This medication is injected into a vein. It is given by your care team in a hospital or clinic setting. Talk to your care team about the use of this medication in children. Special care may be needed. Overdosage: If you think you have taken too much of this medicine contact a poison control center or emergency room at once. NOTE: This medicine is only for you. Do not share this medicine with others. What if I miss a dose? Keep appointments for follow-up doses. It is important not to miss your dose. Call your care team if you are unable to keep an appointment. What may interact with this medication? Medications for seizures Some antibiotics, such as amikacin, gentamicin , neomycin , streptomycin, tobramycin Vaccines This list may not describe all possible interactions. Give your health care provider a list of all the medicines, herbs,  non-prescription drugs, or dietary supplements you use. Also tell them if you smoke, drink alcohol , or use illegal drugs. Some items may interact with your medicine. What should I watch for while using this medication? Your condition will be monitored carefully while you are receiving this medication. You may need blood work while taking this medication. This medication may make you feel generally unwell. This is not uncommon, as chemotherapy can affect healthy cells as well as cancer cells. Report any side effects. Continue your course of treatment even though you feel ill unless your care team tells you to stop. In some cases, you may be given additional medications to help with side effects. Follow all directions for their use. This medication may increase your risk of getting an infection. Call your care team for advice if you get a fever, chills, sore throat, or other symptoms of a cold or flu. Do not treat yourself. Try to avoid being around people who are sick. Avoid taking medications that contain aspirin , acetaminophen , ibuprofen, naproxen, or ketoprofen unless instructed by your care team. These medications may hide a fever.  Be careful brushing or flossing your teeth or using a toothpick because you may get an infection or bleed more easily. If you have any dental work done, tell your dentist you are receiving this medication. Talk to your care team if you wish to become pregnant or think you might be pregnant. This medication can cause serious birth defects. Talk to your care team about effective forms of contraception. Do not breast-feed while taking this medication. What side effects may I notice from receiving this medication? Side effects that you should report to your care team as soon as possible: Allergic reactions--skin rash, itching, hives, swelling of the face, lips, tongue, or throat Infection--fever, chills, cough, sore throat, wounds that don't heal, pain or trouble when passing  urine, general feeling of discomfort or being unwell Low red blood cell level--unusual weakness or fatigue, dizziness, headache, trouble breathing Pain, tingling, or numbness in the hands or feet, muscle weakness, change in vision, confusion or trouble speaking, loss of balance or coordination, trouble walking, seizures Unusual bruising or bleeding Side effects that usually do not require medical attention (report to your care team if they continue or are bothersome): Hair loss Nausea Unusual weakness or fatigue Vomiting This list may not describe all possible side effects. Call your doctor for medical advice about side effects. You may report side effects to FDA at 1-800-FDA-1088. Where should I keep my medication? This medication is given in a hospital or clinic. It will not be stored at home. NOTE: This sheet is a summary. It may not cover all possible information. If you have questions about this medicine, talk to your doctor, pharmacist, or health care provider.  2024 Elsevier/Gold Standard (2022-02-19 00:00:00)

## 2024-05-11 ENCOUNTER — Ambulatory Visit
Admission: RE | Admit: 2024-05-11 | Discharge: 2024-05-11 | Disposition: A | Discharge status: Home / Self Care | Source: Ambulatory Visit | Attending: Radiation Oncology | Admitting: Radiation Oncology

## 2024-05-11 ENCOUNTER — Other Ambulatory Visit: Payer: Self-pay

## 2024-05-11 ENCOUNTER — Other Ambulatory Visit: Payer: Self-pay | Admitting: Physician Assistant

## 2024-05-11 DIAGNOSIS — Z510 Encounter for antineoplastic radiation therapy: Secondary | ICD-10-CM | POA: Insufficient documentation

## 2024-05-11 DIAGNOSIS — R066 Hiccough: Secondary | ICD-10-CM

## 2024-05-11 DIAGNOSIS — C3431 Malignant neoplasm of lower lobe, right bronchus or lung: Secondary | ICD-10-CM | POA: Insufficient documentation

## 2024-05-11 DIAGNOSIS — Z5111 Encounter for antineoplastic chemotherapy: Secondary | ICD-10-CM | POA: Diagnosis not present

## 2024-05-11 DIAGNOSIS — R5383 Other fatigue: Secondary | ICD-10-CM | POA: Insufficient documentation

## 2024-05-11 DIAGNOSIS — D696 Thrombocytopenia, unspecified: Secondary | ICD-10-CM | POA: Insufficient documentation

## 2024-05-11 DIAGNOSIS — J9601 Acute respiratory failure with hypoxia: Secondary | ICD-10-CM | POA: Insufficient documentation

## 2024-05-11 DIAGNOSIS — R634 Abnormal weight loss: Secondary | ICD-10-CM | POA: Diagnosis not present

## 2024-05-11 LAB — RAD ONC ARIA SESSION SUMMARY
Course Elapsed Days: 0
Plan Fractions Treated to Date: 1
Plan Prescribed Dose Per Fraction: 2 Gy
Plan Total Fractions Prescribed: 30
Plan Total Prescribed Dose: 60 Gy
Reference Point Dosage Given to Date: 2 Gy
Reference Point Session Dosage Given: 2 Gy
Session Number: 1

## 2024-05-11 MED ORDER — CHLORPROMAZINE HCL 10 MG PO TABS: 10.0000 mg | ORAL_TABLET | Freq: Three times a day (TID) | ORAL | 0 refills | Status: DC | PRN

## 2024-05-11 MED ORDER — SONAFINE EX EMUL
1.0000 | Freq: Once | CUTANEOUS | Status: AC
  Administered 2024-05-11: 1 via TOPICAL

## 2024-05-12 ENCOUNTER — Other Ambulatory Visit: Payer: Self-pay

## 2024-05-12 ENCOUNTER — Ambulatory Visit

## 2024-05-12 ENCOUNTER — Ambulatory Visit: Admitting: Internal Medicine

## 2024-05-12 ENCOUNTER — Other Ambulatory Visit

## 2024-05-12 ENCOUNTER — Ambulatory Visit
Admission: RE | Admit: 2024-05-12 | Discharge: 2024-05-12 | Disposition: A | Discharge status: Home / Self Care | Source: Ambulatory Visit | Attending: Radiation Oncology | Admitting: Radiation Oncology

## 2024-05-12 DIAGNOSIS — Z5111 Encounter for antineoplastic chemotherapy: Secondary | ICD-10-CM | POA: Diagnosis not present

## 2024-05-12 DIAGNOSIS — D696 Thrombocytopenia, unspecified: Secondary | ICD-10-CM | POA: Diagnosis not present

## 2024-05-12 DIAGNOSIS — Z510 Encounter for antineoplastic radiation therapy: Secondary | ICD-10-CM | POA: Diagnosis not present

## 2024-05-12 DIAGNOSIS — C3431 Malignant neoplasm of lower lobe, right bronchus or lung: Secondary | ICD-10-CM | POA: Diagnosis not present

## 2024-05-12 DIAGNOSIS — R634 Abnormal weight loss: Secondary | ICD-10-CM | POA: Diagnosis not present

## 2024-05-12 DIAGNOSIS — R5383 Other fatigue: Secondary | ICD-10-CM | POA: Diagnosis not present

## 2024-05-12 LAB — RAD ONC ARIA SESSION SUMMARY
Course Elapsed Days: 1
Plan Fractions Treated to Date: 2
Plan Prescribed Dose Per Fraction: 2 Gy
Plan Total Fractions Prescribed: 30
Plan Total Prescribed Dose: 60 Gy
Reference Point Dosage Given to Date: 4 Gy
Reference Point Session Dosage Given: 2 Gy
Session Number: 2

## 2024-05-13 ENCOUNTER — Other Ambulatory Visit: Payer: Self-pay

## 2024-05-13 ENCOUNTER — Ambulatory Visit
Admission: RE | Admit: 2024-05-13 | Discharge: 2024-05-13 | Disposition: A | Discharge status: Home / Self Care | Source: Ambulatory Visit | Attending: Radiation Oncology | Admitting: Radiation Oncology

## 2024-05-13 DIAGNOSIS — R634 Abnormal weight loss: Secondary | ICD-10-CM | POA: Diagnosis not present

## 2024-05-13 DIAGNOSIS — R5383 Other fatigue: Secondary | ICD-10-CM | POA: Diagnosis not present

## 2024-05-13 DIAGNOSIS — Z5111 Encounter for antineoplastic chemotherapy: Secondary | ICD-10-CM | POA: Diagnosis not present

## 2024-05-13 DIAGNOSIS — D696 Thrombocytopenia, unspecified: Secondary | ICD-10-CM | POA: Diagnosis not present

## 2024-05-13 DIAGNOSIS — C3431 Malignant neoplasm of lower lobe, right bronchus or lung: Secondary | ICD-10-CM | POA: Diagnosis not present

## 2024-05-13 DIAGNOSIS — Z510 Encounter for antineoplastic radiation therapy: Secondary | ICD-10-CM | POA: Diagnosis not present

## 2024-05-13 LAB — RAD ONC ARIA SESSION SUMMARY
Course Elapsed Days: 2
Plan Fractions Treated to Date: 3
Plan Prescribed Dose Per Fraction: 2 Gy
Plan Total Fractions Prescribed: 30
Plan Total Prescribed Dose: 60 Gy
Reference Point Dosage Given to Date: 6 Gy
Reference Point Session Dosage Given: 2 Gy
Session Number: 3

## 2024-05-17 ENCOUNTER — Inpatient Hospital Stay: Attending: Internal Medicine

## 2024-05-17 ENCOUNTER — Inpatient Hospital Stay

## 2024-05-17 ENCOUNTER — Ambulatory Visit
Admission: RE | Admit: 2024-05-17 | Discharge: 2024-05-17 | Disposition: A | Discharge status: Home / Self Care | Source: Ambulatory Visit | Attending: Radiation Oncology | Admitting: Radiation Oncology

## 2024-05-17 ENCOUNTER — Encounter: Payer: Self-pay | Admitting: Internal Medicine

## 2024-05-17 ENCOUNTER — Other Ambulatory Visit: Payer: Self-pay

## 2024-05-17 VITALS — BP 122/72 | HR 81 | Temp 98.7°F | Resp 16 | Wt 210.0 lb

## 2024-05-17 DIAGNOSIS — C3431 Malignant neoplasm of lower lobe, right bronchus or lung: Secondary | ICD-10-CM

## 2024-05-17 DIAGNOSIS — Z5111 Encounter for antineoplastic chemotherapy: Secondary | ICD-10-CM | POA: Diagnosis not present

## 2024-05-17 DIAGNOSIS — Z510 Encounter for antineoplastic radiation therapy: Secondary | ICD-10-CM | POA: Diagnosis not present

## 2024-05-17 DIAGNOSIS — R5383 Other fatigue: Secondary | ICD-10-CM | POA: Diagnosis not present

## 2024-05-17 DIAGNOSIS — D696 Thrombocytopenia, unspecified: Secondary | ICD-10-CM | POA: Diagnosis not present

## 2024-05-17 DIAGNOSIS — R634 Abnormal weight loss: Secondary | ICD-10-CM | POA: Diagnosis not present

## 2024-05-17 LAB — CBC WITH DIFFERENTIAL (CANCER CENTER ONLY)
Abs Immature Granulocytes: 0.11 K/uL — ABNORMAL HIGH (ref 0.00–0.07)
Basophils Absolute: 0 K/uL (ref 0.0–0.1)
Basophils Relative: 0 %
Eosinophils Absolute: 0 K/uL (ref 0.0–0.5)
Eosinophils Relative: 0 %
HCT: 29.5 % — ABNORMAL LOW (ref 39.0–52.0)
Hemoglobin: 9.9 g/dL — ABNORMAL LOW (ref 13.0–17.0)
Immature Granulocytes: 2 %
Lymphocytes Relative: 13 %
Lymphs Abs: 0.9 K/uL (ref 0.7–4.0)
MCH: 33.9 pg (ref 26.0–34.0)
MCHC: 33.6 g/dL (ref 30.0–36.0)
MCV: 101 fL — ABNORMAL HIGH (ref 80.0–100.0)
Monocytes Absolute: 0.2 K/uL (ref 0.1–1.0)
Monocytes Relative: 3 %
Neutro Abs: 5.8 K/uL (ref 1.7–7.7)
Neutrophils Relative %: 82 %
Platelet Count: 123 K/uL — ABNORMAL LOW (ref 150–400)
RBC: 2.92 MIL/uL — ABNORMAL LOW (ref 4.22–5.81)
RDW: 16.3 % — ABNORMAL HIGH (ref 11.5–15.5)
WBC Count: 7.1 K/uL (ref 4.0–10.5)
nRBC: 0 % (ref 0.0–0.2)

## 2024-05-17 LAB — RAD ONC ARIA SESSION SUMMARY
Course Elapsed Days: 6
Plan Fractions Treated to Date: 4
Plan Prescribed Dose Per Fraction: 2 Gy
Plan Total Fractions Prescribed: 30
Plan Total Prescribed Dose: 60 Gy
Reference Point Dosage Given to Date: 8 Gy
Reference Point Session Dosage Given: 2 Gy
Session Number: 4

## 2024-05-17 LAB — CMP (CANCER CENTER ONLY)
ALT: 19 U/L (ref 0–44)
AST: 23 U/L (ref 15–41)
Albumin: 3.8 g/dL (ref 3.5–5.0)
Alkaline Phosphatase: 83 U/L (ref 38–126)
Anion gap: 8 (ref 5–15)
BUN: 21 mg/dL (ref 8–23)
CO2: 25 mmol/L (ref 22–32)
Calcium: 9.2 mg/dL (ref 8.9–10.3)
Chloride: 106 mmol/L (ref 98–111)
Creatinine: 1.04 mg/dL (ref 0.61–1.24)
GFR, Estimated: >60 mL/min (ref >60)
Glucose, Bld: 160 mg/dL — ABNORMAL HIGH (ref 70–99)
Potassium: 4.1 mmol/L (ref 3.5–5.1)
Sodium: 139 mmol/L (ref 135–145)
Total Bilirubin: 0.7 mg/dL (ref 0.0–1.2)
Total Protein: 6.4 g/dL — ABNORMAL LOW (ref 6.5–8.1)

## 2024-05-17 LAB — SAMPLE TO BLOOD BANK

## 2024-05-17 MED ORDER — FAMOTIDINE IN NACL 20-0.9 MG/50ML-% IV SOLN
20.0000 mg | Freq: Once | INTRAVENOUS | Status: AC
  Administered 2024-05-17: 20 mg via INTRAVENOUS
  Filled 2024-05-17: qty 50

## 2024-05-17 MED ORDER — DEXAMETHASONE SODIUM PHOSPHATE 10 MG/ML IJ SOLN
10.0000 mg | Freq: Once | INTRAMUSCULAR | Status: AC
  Administered 2024-05-17: 10 mg via INTRAVENOUS
  Filled 2024-05-17: qty 1

## 2024-05-17 MED ORDER — PALONOSETRON HCL INJECTION 0.25 MG/5ML
0.2500 mg | Freq: Once | INTRAVENOUS | Status: AC
  Administered 2024-05-17: 0.25 mg via INTRAVENOUS
  Filled 2024-05-17: qty 5

## 2024-05-17 MED ORDER — DIPHENHYDRAMINE HCL 50 MG/ML IJ SOLN
50.0000 mg | Freq: Once | INTRAMUSCULAR | Status: AC
  Administered 2024-05-17: 50 mg via INTRAVENOUS
  Filled 2024-05-17: qty 1

## 2024-05-17 MED ORDER — SODIUM CHLORIDE 0.9 % IV SOLN
207.2000 mg | Freq: Once | INTRAVENOUS | Status: AC
  Administered 2024-05-17: 210 mg via INTRAVENOUS
  Filled 2024-05-17: qty 21

## 2024-05-17 MED ORDER — SODIUM CHLORIDE 0.9 % IV SOLN: INTRAVENOUS | Status: DC

## 2024-05-17 MED ORDER — SODIUM CHLORIDE 0.9 % IV SOLN
45.0000 mg/m2 | Freq: Once | INTRAVENOUS | Status: AC
  Administered 2024-05-17: 96 mg via INTRAVENOUS
  Filled 2024-05-17: qty 16

## 2024-05-17 NOTE — Patient Instructions (Signed)
 [facility] - A DEPT OF [facility]  Discharge Instructions: Thank you for choosing [facility] to provide your oncology and hematology care.   If you have a lab appointment with the Cancer Center, please go directly to the Cancer Center and check in at the registration area.   Wear comfortable clothing and clothing appropriate for easy access to any Portacath or PICC line.   We strive to give you quality time with your provider. You may need to reschedule your appointment if you arrive late (15 or more minutes).  Arriving late affects you and other patients whose appointments are after yours.  Also, if you miss three or more appointments without notifying the office, you may be dismissed from the clinic at the provider's discretion.      For prescription refill requests, have your pharmacy contact our office and allow 72 hours for refills to be completed.    Today you received the following chemotherapy and/or immunotherapy agents: Paclitaxel , carboplatin       To help prevent nausea and vomiting after your treatment, we encourage you to take your nausea medication as directed.  BELOW ARE SYMPTOMS THAT SHOULD BE REPORTED IMMEDIATELY: *FEVER GREATER THAN 100.4 F (38 C) OR HIGHER *CHILLS OR SWEATING *NAUSEA AND VOMITING THAT IS NOT CONTROLLED WITH YOUR NAUSEA MEDICATION *UNUSUAL SHORTNESS OF BREATH *UNUSUAL BRUISING OR BLEEDING *URINARY PROBLEMS (pain or burning when urinating, or frequent urination) *BOWEL PROBLEMS (unusual diarrhea, constipation, pain near the anus) TENDERNESS IN MOUTH AND THROAT WITH OR WITHOUT PRESENCE OF ULCERS (sore throat, sores in mouth, or a toothache) UNUSUAL RASH, SWELLING OR PAIN  UNUSUAL VAGINAL DISCHARGE OR ITCHING   Items with * indicate a potential emergency and should be followed up as soon as possible or go to the Emergency Department if any problems should occur.  Please show the CHEMOTHERAPY ALERT CARD or  IMMUNOTHERAPY ALERT CARD at check-in to the Emergency Department and triage nurse.  Should you have questions after your visit or need to cancel or reschedule your appointment, please contact [facility]  Dept: [phone]  and follow the prompts.  Office hours are 8:00 a.m. to 4:30 p.m. Monday - Friday. Please note that voicemails left after 4:00 p.m. may not be returned until the following business day.  We are closed weekends and major holidays. You have access to a nurse at all times for urgent questions. Please call the main number to the clinic Dept: [phone] and follow the prompts.   For any non-urgent questions, you may also contact your provider using MyChart. We now offer e-Visits for anyone 72 and older to request care online for non-urgent symptoms. For details visit [email or web address].   Also download the MyChart app! Go to the app store, search "MyChart", open the app, select [place], and log in with your MyChart username and password.

## 2024-05-18 ENCOUNTER — Ambulatory Visit
Admission: RE | Admit: 2024-05-18 | Discharge: 2024-05-18 | Disposition: A | Discharge status: Home / Self Care | Source: Ambulatory Visit | Attending: Radiation Oncology | Admitting: Radiation Oncology

## 2024-05-18 ENCOUNTER — Other Ambulatory Visit: Payer: Self-pay

## 2024-05-18 DIAGNOSIS — Z5111 Encounter for antineoplastic chemotherapy: Secondary | ICD-10-CM | POA: Diagnosis not present

## 2024-05-18 DIAGNOSIS — C3431 Malignant neoplasm of lower lobe, right bronchus or lung: Secondary | ICD-10-CM | POA: Diagnosis not present

## 2024-05-18 DIAGNOSIS — R634 Abnormal weight loss: Secondary | ICD-10-CM | POA: Diagnosis not present

## 2024-05-18 DIAGNOSIS — R5383 Other fatigue: Secondary | ICD-10-CM | POA: Diagnosis not present

## 2024-05-18 DIAGNOSIS — D696 Thrombocytopenia, unspecified: Secondary | ICD-10-CM | POA: Diagnosis not present

## 2024-05-18 DIAGNOSIS — Z510 Encounter for antineoplastic radiation therapy: Secondary | ICD-10-CM | POA: Diagnosis not present

## 2024-05-18 LAB — RAD ONC ARIA SESSION SUMMARY
Course Elapsed Days: 7
Plan Fractions Treated to Date: 5
Plan Prescribed Dose Per Fraction: 2 Gy
Plan Total Fractions Prescribed: 30
Plan Total Prescribed Dose: 60 Gy
Reference Point Dosage Given to Date: 10 Gy
Reference Point Session Dosage Given: 2 Gy
Session Number: 5

## 2024-05-19 ENCOUNTER — Other Ambulatory Visit

## 2024-05-19 ENCOUNTER — Ambulatory Visit
Admission: RE | Admit: 2024-05-19 | Discharge: 2024-05-19 | Disposition: A | Discharge status: Home / Self Care | Source: Ambulatory Visit | Attending: Radiation Oncology | Admitting: Radiation Oncology

## 2024-05-19 ENCOUNTER — Ambulatory Visit: Admitting: Internal Medicine

## 2024-05-19 ENCOUNTER — Other Ambulatory Visit: Payer: Self-pay

## 2024-05-19 ENCOUNTER — Ambulatory Visit

## 2024-05-19 DIAGNOSIS — R5383 Other fatigue: Secondary | ICD-10-CM | POA: Diagnosis not present

## 2024-05-19 DIAGNOSIS — D696 Thrombocytopenia, unspecified: Secondary | ICD-10-CM | POA: Diagnosis not present

## 2024-05-19 DIAGNOSIS — Z5111 Encounter for antineoplastic chemotherapy: Secondary | ICD-10-CM | POA: Diagnosis not present

## 2024-05-19 DIAGNOSIS — C3431 Malignant neoplasm of lower lobe, right bronchus or lung: Secondary | ICD-10-CM | POA: Diagnosis not present

## 2024-05-19 DIAGNOSIS — R634 Abnormal weight loss: Secondary | ICD-10-CM | POA: Diagnosis not present

## 2024-05-19 DIAGNOSIS — Z510 Encounter for antineoplastic radiation therapy: Secondary | ICD-10-CM | POA: Diagnosis not present

## 2024-05-19 LAB — RAD ONC ARIA SESSION SUMMARY
Course Elapsed Days: 8
Plan Fractions Treated to Date: 6
Plan Prescribed Dose Per Fraction: 2 Gy
Plan Total Fractions Prescribed: 30
Plan Total Prescribed Dose: 60 Gy
Reference Point Dosage Given to Date: 12 Gy
Reference Point Session Dosage Given: 2 Gy
Session Number: 6

## 2024-05-19 NOTE — Progress Notes (Signed)
 [facility] OFFICE PROGRESS NOTE  Koirala, [name], MD [address] [place] [address]  DIAGNOSIS: Stage IIIA (T4, N1, M0) non-small cell lung cancer, adenocarcinoma presented with right lower lobe lung mass in addition to suspicious right upper lobe lesion and right hilar lymphadenopathy diagnosed in April 2025.    Biomarker Findings HRD signature - Cannot Be Determined Microsatellite status - Cannot Be Determined ? Tumor Mutational Burden - Cannot Be Determined Genomic Findings For a complete list of the genes assayed, please refer to the Appendix. AKT2 amplification CDKN2A p16INK4a R80* and p14ARF P94L KRAS G12D ASXL1 W796fs*22 GNAS R201H U2AF1 S34F 7 Disease relevant genes with no reportable alterations: ALK, BRAF, EGFR, ERBB2, MET, RET, ROS1   PDL1 TPS: 0%  PRIOR THERAPY: Neoadjuvant systemic chemotherapy with carboplatin  for AUC of 5, Alimta  500 Mg/M2 and Keytruda  200 Mg IV every 3 weeks. Status post 1 cycle. This was discontinued secondary to intolerance.   CURRENT THERAPY: A course of concurrent chemoradiation with weekly carboplatin  for AUC of 2 and paclitaxel  45 Mg/M2.  First dose dose 05/10/24. Status post 2 weeks of treatment.   INTERVAL HISTORY: LADANIAN KELTER 78 y.o. male returns to the clinic today for a follow-up visit. The patient was last seen in the clinic 2 weeks ago.  The patient is currently undergoing a course of concurrent chemoradiation.  He status post his first 2 weeks of treatment he is tolerated it well except for hiccups and the decadron  keep him up the night after treatment. He takes tylenol  PM and he takes compazine  to help with nausea after returning home for prevention/precaution after treatment.   The patient overall states he is pleased with his treatment and states it is not bad and he is happy that he has not had any nausea/vomiting.   He experiences shortness of breath with exertion, an issue persisting  since last summer. His oxygen levels fluctuate. We have checked him to see if he qualifies for supplemental oxygen in the clinic but he did not qualify. He mentions when he was home and went to the restroom and made a sandwich it went down to 86 but he put his CPAP on and rested and it improves in under 1 minute.   He reports no chest pain, cough, or hemoptysis.  It looks like he lost weight but he states he eats when he is hungry. His appetite is stable but he has lost some weight, eating only twice a day. He typically eats breakfast around 10 a.m. and then eats again at 5 or 6 p.m. He snacks on Jell-O and other small items throughout the day. He reports no diarrhea or constipation.  He reports no headaches or vision changes. His blood sugar was noted to be 215. He has been able to walk into the clinic without a wheelchair recently.   He is here today for evaluation and repeat blood work before undergoing cycle #3.  MEDICAL HISTORY: Past Medical History:  Diagnosis Date   Arthritis    At risk for sleep apnea    STOP-BANG= 5       SENT TO PCP 07-12-2015   CAD (coronary artery disease)    a. s/p PCI with DES x 3 to LAD in 2006 with bifurcation lesion and Plavix  indefinitely was recommended.   Chronic combined systolic and diastolic CHF (congestive heart failure) (HCC)    a. Prior EF normal but EF dropped to 30-35% in 07/2016 in setting of AFIB.  CKD (chronic kidney disease), stage III (HCC)    Depression    Dyslipidemia    Dyspnea    tired quick d/t Afib   Dyspnea on exertion    Dysrhythmia    A.FIB   GERD (gastroesophageal reflux disease)    History of GI bleed    History of kidney stones    History of MI (myocardial infarction)    11-05-2005--  periprocedural MI  (cardiac cath) per dr obie note   Hypertension    Hypertriglyceridemia    Iron deficiency anemia    Myocardial infarction [facility])    Persistent atrial fibrillation (HCC)    a. 07/2016, started on Eliquis  -> readmitted  later that month for AF RVR/acute HF - initially unsuccessful DCCV following TEE, so loaded with amio with repeat successful DCCV 07/2816.   Right ureteral stone    S/P drug eluting coronary stent placement    x3  to LAD  2006   Sleep apnea    wears BIPAP, 14 up to 24    ALLERGIES:  is allergic to prednisone  and spironolactone .  MEDICATIONS:  Current Outpatient Medications  Medication Sig Dispense Refill   albuterol  (VENTOLIN  HFA) 108 (90 Base) MCG/ACT inhaler Inhale 2 puffs into the lungs every 6 (six) hours as needed for wheezing or shortness of breath (intractable cough). (Patient not taking: Reported on 05/10/2024) 8 g 2   allopurinol  (ZYLOPRIM ) 100 MG tablet Take 100 mg by mouth at bedtime.     allopurinol  (ZYLOPRIM ) 300 MG tablet Take 300 mg by mouth in the morning.     atorvastatin  (LIPITOR) 40 MG tablet TAKE 1 TABLET EVERY DAY (NEED MD APPOINTMENT) 90 tablet 0   benzonatate  (TESSALON  PERLES) 100 MG capsule Take 2 capsules (200 mg total) by mouth 2 (two) times daily as needed for cough. (Patient not taking: Reported on 05/10/2024) 30 capsule 1   budeson-glycopyrrolate-formoterol (BREZTRI  AEROSPHERE) 160-9-4.8 MCG/ACT AERO Inhale 2 puffs into the lungs in the morning and at bedtime. (Patient not taking: Reported on 05/10/2024)     carvedilol  (COREG ) 25 MG tablet TAKE 1 TABLET TWICE DAILY WITH MEALS 180 tablet 0   chlorproMAZINE  (THORAZINE ) 10 MG tablet Take 1 tablet (10 mg total) by mouth 3 (three) times daily as needed. 15 tablet 0   Cyanocobalamin  (VITAMIN B-12 PO) Take 1 tablet by mouth daily.     diphenhydramine -acetaminophen  (TYLENOL  PM) 25-500 MG TABS tablet Take 1 tablet by mouth at bedtime.     ferrous sulfate  325 (65 FE) MG tablet Take 1 tablet (325 mg total) by mouth every morning. 90 tablet 2   furosemide  (LASIX ) 40 MG tablet TAKE 1 TABLET EVERY DAY 90 tablet 0   hydrocortisone  1 % lotion Apply 1 Application topically 2 (two) times daily. (Patient not taking: Reported on  05/10/2024) 118 mL 0   lisinopril  (ZESTRIL ) 40 MG tablet TAKE 1 TABLET EVERY DAY (Patient taking differently: Take 40 mg by mouth daily. Taking 1/2 tablet a day) 90 tablet 3   nitroGLYCERIN  (NITROSTAT ) 0.4 MG SL tablet Place 1 tablet (0.4 mg total) under the tongue every 5 (five) minutes as needed for chest pain. X 3 doses 25 tablet 5   polyvinyl alcohol  (LIQUIFILM TEARS) 1.4 % ophthalmic solution Place 1 drop into both eyes daily as needed for dry eyes.     rivaroxaban  (XARELTO ) 20 MG TABS tablet Take 1 tablet (20 mg total) by mouth at bedtime. Okay to restart this medication on 02/10/24     sertraline  (ZOLOFT ) 25  MG tablet Take 25 mg by mouth daily.     triamcinolone  cream (KENALOG ) 0.1 % Apply 1 Application topically 2 (two) times daily. (Patient not taking: Reported on 05/10/2024) 453.6 g 0   No current facility-administered medications for this visit.    SURGICAL HISTORY:  Past Surgical History:  Procedure Laterality Date   BRONCHIAL BIOPSY  02/09/2024   Procedure: BRONCHOSCOPY, WITH BIOPSY;  Surgeon: Shelah Lamar RAMAN, MD;  Location: [facility] ENDOSCOPY;  Service: Pulmonary;;   BRONCHIAL BRUSHINGS  02/09/2024   Procedure: BRONCHOSCOPY, WITH BRUSH BIOPSY;  Surgeon: Shelah Lamar RAMAN, MD;  Location: [facility] ENDOSCOPY;  Service: Pulmonary;;   BRONCHIAL NEEDLE ASPIRATION BIOPSY  02/09/2024   Procedure: BRONCHOSCOPY, WITH NEEDLE ASPIRATION BIOPSY;  Surgeon: Shelah Lamar RAMAN, MD;  Location: [facility] ENDOSCOPY;  Service: Pulmonary;;   BRONCHIAL WASHINGS  02/09/2024   Procedure: IRRIGATION, BRONCHUS;  Surgeon: Shelah Lamar RAMAN, MD;  Location: [facility] ENDOSCOPY;  Service: Pulmonary;;   CARDIOVASCULAR STRESS TEST  01-06-2013  dr micky   normal perfusion study/  no ischemia or infarct/  normal LV function and wall motion , ef 57%   CARDIOVERSION N/A 08/05/2016   Procedure: CARDIOVERSION;  Surgeon: Jerel Balding, MD;  Location: [facility] ENDOSCOPY;  Service: Cardiovascular;  Laterality: N/A;   CARDIOVERSION N/A 08/08/2016   Procedure:  CARDIOVERSION;  Surgeon: Oneil JAYSON Parchment, MD;  Location: [facility] ENDOSCOPY;  Service: Cardiovascular;  Laterality: N/A;   CORONARY ANGIOPLASTY  11-13-2005 dr obie   Successful touch up post-dilatation of the 3 tandem overlying Cypher stents in proximal to mid LAD (based on IVUS 0% stenosis and patent diagonal branch)/  Cutting Balloon Angioplasty of Ramus branch   CORONARY ANGIOPLASTY WITH STENT PLACEMENT  11-05-2005   dr wolm brodie   PCI and DES x3 to birfurcation lesion LAD and diagonal branch of LAD/   20% dLM,  30-40% RCA, ef 60%   CYSTOSCOPY WITH RETROGRADE PYELOGRAM, URETEROSCOPY AND STENT PLACEMENT Right 07/14/2015   Procedure: CYSTOSCOPY WITH RETROGRADE PYELOGRAM, URETEROSCOPY AND STENT PLACEMENT;  Surgeon: Ricardo Likens, MD;  Location: [facility];  Service: Urology;  Laterality: Right;   ELECTROPHYSIOLOGIC STUDY N/A 10/24/2016   Procedure: Atrial Fibrillation Ablation;  Surgeon: Will Gladis Norton, MD;  Location: [facility] INVASIVE CV LAB;  Service: Cardiovascular;  Laterality: N/A;   EXTRACORPOREAL SHOCK WAVE LITHOTRIPSY  yrs ago   HEMORRHOID SURGERY N/A 10/24/2017   Procedure: HEMORRHOIDECTOMY;  Surgeon: Teresa Lonni HERO, MD;  Location: [facility] OR;  Service: General;  Laterality: N/A;   HERNIA REPAIR Bilateral 2000   Inguinal Hernia   IR GENERIC HISTORICAL  10/23/2016   IR FLUORO GUIDE CV LINE RIGHT 10/23/2016 Ozell Specking, MD [facility]-INTER[facility] RAD   IR GENERIC HISTORICAL  10/23/2016   IR US  GUIDE VASC ACCESS RIGHT 10/23/2016 Ozell Specking, MD [facility]   LEFT URETEROSCOPIC STONE EXTRACTION  04/10/2006   STONE EXTRACTION WITH BASKET Right 07/14/2015   Procedure: STONE EXTRACTION WITH BASKET;  Surgeon: Ricardo Likens, MD;  Location: [facility];  Service: Urology;  Laterality: Right;   TEE WITHOUT CARDIOVERSION N/A 08/05/2016   Procedure: TRANSESOPHAGEAL ECHOCARDIOGRAM (TEE);  Surgeon: Jerel Balding, MD;  Location: [facility] ENDOSCOPY;  Service: Cardiovascular;   Laterality: N/A;   TOTAL HIP ARTHROPLASTY Right 07/16/2022   Procedure: RIGHT TOTAL HIP ARTHROPLASTY ANTERIOR APPROACH;  Surgeon: Jerri Kay HERO, MD;  Location: [facility] OR;  Service: Orthopedics;  Laterality: Right;  3-C   TRANSTHORACIC ECHOCARDIOGRAM  02/04/2012   grade I diastolic dysfunction/  ef 55-60%/  trivial MR and TR/  mild LAE   VIDEO BRONCHOSCOPY WITH ENDOBRONCHIAL NAVIGATION Right 02/09/2024   Procedure: VIDEO BRONCHOSCOPY WITH ENDOBRONCHIAL NAVIGATION;  Surgeon: Shelah Lamar RAMAN, MD;  Location: [facility] ENDOSCOPY;  Service: Pulmonary;  Laterality: Right;  WITH FLOURO    REVIEW OF SYSTEMS:   Review of Systems  Constitutional: Positive for stable fatigue. Positive for weight loss. Negative for  chills and fever.  HENT: Negative for mouth sores, nosebleeds, sore throat and trouble swallowing.   Eyes: Negative for eye problems and icterus.  Respiratory: Positive for dyspnea on exertion. Negative for cough, hemoptysis,  and wheezing.   Cardiovascular: Negative for chest pain and leg swelling.  Gastrointestinal: Negative for abdominal pain, constipation, diarrhea, nausea and vomiting.  Genitourinary: Negative for bladder incontinence, difficulty urinating, dysuria, frequency and hematuria.   Musculoskeletal: Negative for back pain, gait problem, neck pain and neck stiffness.  Skin: Positive for baseline dry skin. The prior rash he had with chemo/immunotherapy has resolved per patient report.  Neurological: Negative for dizziness, extremity weakness, gait problem, headaches, light-headedness and seizures.  Hematological: Negative for adenopathy. Does not bruise/bleed easily.  Psychiatric/Behavioral: Negative for confusion, depression and sleep disturbance. The patient is not nervous/anxious.     PHYSICAL EXAMINATION:  There were no vitals taken for this visit.  ECOG PERFORMANCE STATUS: 1  Physical Exam  Constitutional: Oriented to person, place, and time and well-developed, well-nourished, and  in no distress.  HENT:  Head: Normocephalic and atraumatic.  Mouth/Throat: Oropharynx is clear and moist. No oropharyngeal exudate.  Eyes: Conjunctivae are normal. Right eye exhibits no discharge. Left eye exhibits no discharge. No scleral icterus.  Neck: Normal range of motion. Neck supple.  Cardiovascular: Normal rate, regular rhythm, normal heart sounds and intact distal pulses.   Pulmonary/Chest: Effort normal and breath sounds normal. No respiratory distress. No wheezes. No rales.  Abdominal: Soft. Bowel sounds are normal. Exhibits no distension and no mass. There is no tenderness.  Musculoskeletal: Normal range of motion. Exhibits no edema.  Lymphadenopathy:    No cervical adenopathy.  Neurological: Alert and oriented to person, place, and time. Exhibits normal muscle tone.  Examined in the wheelchair. Skin: Skin is warm and dry.  Not diaphoretic. No erythema. No pallor.  Psychiatric: Mood, memory and judgment normal.  Vitals reviewed.  LABORATORY DATA: Lab Results  Component Value Date   WBC 7.1 05/17/2024   HGB 9.9 (L) 05/17/2024   HCT 29.5 (L) 05/17/2024   MCV 101.0 (H) 05/17/2024   PLT 123 (L) 05/17/2024      Chemistry      Component Value Date/Time   NA 139 05/17/2024 0849   K 4.1 05/17/2024 0849   CL 106 05/17/2024 0849   CO2 25 05/17/2024 0849   BUN 21 05/17/2024 0849   CREATININE 1.04 05/17/2024 0849   CREATININE 1.72 (H) 10/16/2016 1115      Component Value Date/Time   CALCIUM  9.2 05/17/2024 0849   ALKPHOS 83 05/17/2024 0849   AST 23 05/17/2024 0849   ALT 19 05/17/2024 0849   BILITOT 0.7 05/17/2024 0849       RADIOGRAPHIC STUDIES:  No results found.   ASSESSMENT/PLAN:  This is a very pleasant 78 year old Caucasian male with stage III aT4, N1, M0) non-small cell lung cancer, adenocarcinoma.  He presented with a right lower lobe lung mass in addition to suspicious right upper lobe lesion and right hilar lymphadenopathy.  He was diagnosed in April  2025.   His molecular studies show he has no actionable mutations and his  PD-L1 expression was negative.   He was on neoadjuvant chemoimmunotherapy with carboplatin  for an AUC of 5, Alimta  500 mg/m, and Keytruda  200 mg IV every 3 weeks.  He status post 1 cycle and experience a significant rash which may be from his immunotherapy and cytopenias from his chemotherapy which required G-CSF injections.  He also had significant thrombocytopenia.   Therefore his treatment was discontinued due to severe intolerance.   The patient is currently undergoing a course of concurrent chemoradiation with carboplatin  for AUC of 2 and paclitaxel  45 mg/m.  His first dose was on 05/10/2024. He is status post 2 weeks of treatment.    Labs were reviewed.  Recommend that he proceed with cycle #3 today as scheduled.  Weight loss Weight loss possibly due to reduced appetite and meal frequency. - Monitor weight regularly. - Consider nutritional consultation if significant weight loss continues.  He will continue to monitor his oxygen at home. He did not qualify for supplemental oxygen. He will rest between activities if needed.   We talked about the decadron  premedication which helps with side effects such as nausea as well as premedication to reduce risk of reaction/allergy to treatment. We discussed risks/benefits. The patient would like to continue decadron  as in the care plan.   The patient was advised to call immediately if he has any concerning symptoms in the interval. The patient voices understanding of current disease status and treatment options and is in agreement with the current care plan. All questions were answered. The patient knows to call the clinic with any problems, questions or concerns. We can certainly see the patient much sooner if necessary       No orders of the defined types were placed in this encounter.    The total time spent in the appointment was 20-29 minutes  Alizea Pell L  Elisha Cooksey, PA-C 05/19/24

## 2024-05-20 ENCOUNTER — Ambulatory Visit
Admission: RE | Admit: 2024-05-20 | Discharge: 2024-05-20 | Disposition: A | Discharge status: Home / Self Care | Source: Ambulatory Visit | Attending: Radiation Oncology | Admitting: Radiation Oncology

## 2024-05-20 ENCOUNTER — Other Ambulatory Visit: Payer: Self-pay

## 2024-05-20 DIAGNOSIS — Z5111 Encounter for antineoplastic chemotherapy: Secondary | ICD-10-CM | POA: Diagnosis not present

## 2024-05-20 DIAGNOSIS — D696 Thrombocytopenia, unspecified: Secondary | ICD-10-CM | POA: Diagnosis not present

## 2024-05-20 DIAGNOSIS — R5383 Other fatigue: Secondary | ICD-10-CM | POA: Diagnosis not present

## 2024-05-20 DIAGNOSIS — C3431 Malignant neoplasm of lower lobe, right bronchus or lung: Secondary | ICD-10-CM | POA: Diagnosis not present

## 2024-05-20 DIAGNOSIS — Z510 Encounter for antineoplastic radiation therapy: Secondary | ICD-10-CM | POA: Diagnosis not present

## 2024-05-20 DIAGNOSIS — R634 Abnormal weight loss: Secondary | ICD-10-CM | POA: Diagnosis not present

## 2024-05-20 LAB — RAD ONC ARIA SESSION SUMMARY
Course Elapsed Days: 9
Plan Fractions Treated to Date: 7
Plan Prescribed Dose Per Fraction: 2 Gy
Plan Total Fractions Prescribed: 30
Plan Total Prescribed Dose: 60 Gy
Reference Point Dosage Given to Date: 14 Gy
Reference Point Session Dosage Given: 2 Gy
Session Number: 7

## 2024-05-21 ENCOUNTER — Other Ambulatory Visit: Payer: Self-pay

## 2024-05-21 ENCOUNTER — Ambulatory Visit
Admission: RE | Admit: 2024-05-21 | Discharge: 2024-05-21 | Disposition: A | Discharge status: Home / Self Care | Source: Ambulatory Visit | Attending: Radiation Oncology | Admitting: Radiation Oncology

## 2024-05-21 DIAGNOSIS — R634 Abnormal weight loss: Secondary | ICD-10-CM | POA: Diagnosis not present

## 2024-05-21 DIAGNOSIS — D696 Thrombocytopenia, unspecified: Secondary | ICD-10-CM | POA: Diagnosis not present

## 2024-05-21 DIAGNOSIS — C3431 Malignant neoplasm of lower lobe, right bronchus or lung: Secondary | ICD-10-CM | POA: Diagnosis not present

## 2024-05-21 DIAGNOSIS — R5383 Other fatigue: Secondary | ICD-10-CM | POA: Diagnosis not present

## 2024-05-21 DIAGNOSIS — Z510 Encounter for antineoplastic radiation therapy: Secondary | ICD-10-CM | POA: Diagnosis not present

## 2024-05-21 DIAGNOSIS — Z5111 Encounter for antineoplastic chemotherapy: Secondary | ICD-10-CM | POA: Diagnosis not present

## 2024-05-21 LAB — RAD ONC ARIA SESSION SUMMARY
Course Elapsed Days: 10
Plan Fractions Treated to Date: 8
Plan Prescribed Dose Per Fraction: 2 Gy
Plan Total Fractions Prescribed: 30
Plan Total Prescribed Dose: 60 Gy
Reference Point Dosage Given to Date: 16 Gy
Reference Point Session Dosage Given: 2 Gy
Session Number: 8

## 2024-05-24 ENCOUNTER — Other Ambulatory Visit: Payer: Self-pay

## 2024-05-24 ENCOUNTER — Inpatient Hospital Stay (HOSPITAL_BASED_OUTPATIENT_CLINIC_OR_DEPARTMENT_OTHER): Admitting: Physician Assistant

## 2024-05-24 ENCOUNTER — Inpatient Hospital Stay

## 2024-05-24 ENCOUNTER — Ambulatory Visit
Admission: RE | Admit: 2024-05-24 | Discharge: 2024-05-24 | Disposition: A | Discharge status: Home / Self Care | Source: Ambulatory Visit | Attending: Radiation Oncology | Admitting: Radiation Oncology

## 2024-05-24 VITALS — BP 124/81 | HR 91 | Temp 97.9°F | Resp 17 | Ht 68.0 in | Wt 207.5 lb

## 2024-05-24 DIAGNOSIS — C3431 Malignant neoplasm of lower lobe, right bronchus or lung: Secondary | ICD-10-CM | POA: Diagnosis not present

## 2024-05-24 DIAGNOSIS — R634 Abnormal weight loss: Secondary | ICD-10-CM | POA: Diagnosis not present

## 2024-05-24 DIAGNOSIS — Z510 Encounter for antineoplastic radiation therapy: Secondary | ICD-10-CM | POA: Diagnosis not present

## 2024-05-24 DIAGNOSIS — R5383 Other fatigue: Secondary | ICD-10-CM | POA: Diagnosis not present

## 2024-05-24 DIAGNOSIS — Z5111 Encounter for antineoplastic chemotherapy: Secondary | ICD-10-CM | POA: Diagnosis not present

## 2024-05-24 DIAGNOSIS — D696 Thrombocytopenia, unspecified: Secondary | ICD-10-CM | POA: Diagnosis not present

## 2024-05-24 LAB — CMP (CANCER CENTER ONLY)
ALT: 18 U/L (ref 0–44)
AST: 18 U/L (ref 15–41)
Albumin: 4 g/dL (ref 3.5–5.0)
Alkaline Phosphatase: 93 U/L (ref 38–126)
Anion gap: 9 (ref 5–15)
BUN: 25 mg/dL — ABNORMAL HIGH (ref 8–23)
CO2: 22 mmol/L (ref 22–32)
Calcium: 9.2 mg/dL (ref 8.9–10.3)
Chloride: 108 mmol/L (ref 98–111)
Creatinine: 1.15 mg/dL (ref 0.61–1.24)
GFR, Estimated: >60 mL/min (ref >60)
Glucose, Bld: 215 mg/dL — ABNORMAL HIGH (ref 70–99)
Potassium: 4.6 mmol/L (ref 3.5–5.1)
Sodium: 139 mmol/L (ref 135–145)
Total Bilirubin: 0.8 mg/dL (ref 0.0–1.2)
Total Protein: 6.6 g/dL (ref 6.5–8.1)

## 2024-05-24 LAB — RAD ONC ARIA SESSION SUMMARY
Course Elapsed Days: 13
Plan Fractions Treated to Date: 9
Plan Prescribed Dose Per Fraction: 2 Gy
Plan Total Fractions Prescribed: 30
Plan Total Prescribed Dose: 60 Gy
Reference Point Dosage Given to Date: 18 Gy
Reference Point Session Dosage Given: 2 Gy
Session Number: 9

## 2024-05-24 LAB — SAMPLE TO BLOOD BANK

## 2024-05-24 LAB — CBC WITH DIFFERENTIAL (CANCER CENTER ONLY)
Abs Immature Granulocytes: 0.05 K/uL (ref 0.00–0.07)
Basophils Absolute: 0 K/uL (ref 0.0–0.1)
Basophils Relative: 0 %
Eosinophils Absolute: 0 K/uL (ref 0.0–0.5)
Eosinophils Relative: 0 %
HCT: 30.8 % — ABNORMAL LOW (ref 39.0–52.0)
Hemoglobin: 10.6 g/dL — ABNORMAL LOW (ref 13.0–17.0)
Immature Granulocytes: 1 %
Lymphocytes Relative: 10 %
Lymphs Abs: 0.6 K/uL — ABNORMAL LOW (ref 0.7–4.0)
MCH: 34 pg (ref 26.0–34.0)
MCHC: 34.4 g/dL (ref 30.0–36.0)
MCV: 98.7 fL (ref 80.0–100.0)
Monocytes Absolute: 0.1 K/uL (ref 0.1–1.0)
Monocytes Relative: 3 %
Neutro Abs: 4.6 K/uL (ref 1.7–7.7)
Neutrophils Relative %: 86 %
Platelet Count: 122 K/uL — ABNORMAL LOW (ref 150–400)
RBC: 3.12 MIL/uL — ABNORMAL LOW (ref 4.22–5.81)
RDW: 15.8 % — ABNORMAL HIGH (ref 11.5–15.5)
WBC Count: 5.4 K/uL (ref 4.0–10.5)
nRBC: 0 % (ref 0.0–0.2)

## 2024-05-24 MED ORDER — PALONOSETRON HCL INJECTION 0.25 MG/5ML
0.2500 mg | Freq: Once | INTRAVENOUS | Status: AC
  Administered 2024-05-24: 0.25 mg via INTRAVENOUS

## 2024-05-24 MED ORDER — DIPHENHYDRAMINE HCL 50 MG/ML IJ SOLN
50.0000 mg | Freq: Once | INTRAMUSCULAR | Status: AC
  Administered 2024-05-24: 50 mg via INTRAVENOUS
  Filled 2024-05-24: qty 1

## 2024-05-24 MED ORDER — DEXAMETHASONE SODIUM PHOSPHATE 10 MG/ML IJ SOLN
10.0000 mg | Freq: Once | INTRAMUSCULAR | Status: AC
  Administered 2024-05-24: 10 mg via INTRAVENOUS

## 2024-05-24 MED ORDER — SODIUM CHLORIDE 0.9 % IV SOLN
207.2000 mg | Freq: Once | INTRAVENOUS | Status: AC
  Administered 2024-05-24: 210 mg via INTRAVENOUS
  Filled 2024-05-24: qty 21

## 2024-05-24 MED ORDER — SODIUM CHLORIDE 0.9 % IV SOLN: INTRAVENOUS | Status: DC

## 2024-05-24 MED ORDER — SODIUM CHLORIDE 0.9 % IV SOLN
45.0000 mg/m2 | Freq: Once | INTRAVENOUS | Status: AC
  Administered 2024-05-24: 96 mg via INTRAVENOUS
  Filled 2024-05-24: qty 16

## 2024-05-24 MED ORDER — FAMOTIDINE IN NACL 20-0.9 MG/50ML-% IV SOLN
20.0000 mg | Freq: Once | INTRAVENOUS | Status: AC
  Administered 2024-05-24: 20 mg via INTRAVENOUS
  Filled 2024-05-24: qty 50

## 2024-05-25 ENCOUNTER — Ambulatory Visit
Admission: RE | Admit: 2024-05-25 | Discharge: 2024-05-25 | Disposition: A | Discharge status: Home / Self Care | Source: Ambulatory Visit | Attending: Radiation Oncology | Admitting: Radiation Oncology

## 2024-05-25 ENCOUNTER — Other Ambulatory Visit: Payer: Self-pay

## 2024-05-25 DIAGNOSIS — D696 Thrombocytopenia, unspecified: Secondary | ICD-10-CM | POA: Diagnosis not present

## 2024-05-25 DIAGNOSIS — Z5111 Encounter for antineoplastic chemotherapy: Secondary | ICD-10-CM | POA: Diagnosis not present

## 2024-05-25 DIAGNOSIS — Z510 Encounter for antineoplastic radiation therapy: Secondary | ICD-10-CM | POA: Diagnosis not present

## 2024-05-25 DIAGNOSIS — C3431 Malignant neoplasm of lower lobe, right bronchus or lung: Secondary | ICD-10-CM | POA: Diagnosis not present

## 2024-05-25 DIAGNOSIS — R5383 Other fatigue: Secondary | ICD-10-CM | POA: Diagnosis not present

## 2024-05-25 DIAGNOSIS — R634 Abnormal weight loss: Secondary | ICD-10-CM | POA: Diagnosis not present

## 2024-05-25 LAB — RAD ONC ARIA SESSION SUMMARY
Course Elapsed Days: 14
Plan Fractions Treated to Date: 10
Plan Prescribed Dose Per Fraction: 2 Gy
Plan Total Fractions Prescribed: 30
Plan Total Prescribed Dose: 60 Gy
Reference Point Dosage Given to Date: 20 Gy
Reference Point Session Dosage Given: 2 Gy
Session Number: 10

## 2024-05-26 ENCOUNTER — Ambulatory Visit
Admission: RE | Admit: 2024-05-26 | Discharge: 2024-05-26 | Disposition: A | Discharge status: Home / Self Care | Source: Ambulatory Visit | Attending: Radiation Oncology | Admitting: Radiation Oncology

## 2024-05-26 ENCOUNTER — Other Ambulatory Visit: Payer: Self-pay

## 2024-05-26 DIAGNOSIS — R634 Abnormal weight loss: Secondary | ICD-10-CM | POA: Diagnosis not present

## 2024-05-26 DIAGNOSIS — D696 Thrombocytopenia, unspecified: Secondary | ICD-10-CM | POA: Diagnosis not present

## 2024-05-26 DIAGNOSIS — R5383 Other fatigue: Secondary | ICD-10-CM | POA: Diagnosis not present

## 2024-05-26 DIAGNOSIS — C3431 Malignant neoplasm of lower lobe, right bronchus or lung: Secondary | ICD-10-CM | POA: Diagnosis not present

## 2024-05-26 DIAGNOSIS — Z510 Encounter for antineoplastic radiation therapy: Secondary | ICD-10-CM | POA: Diagnosis not present

## 2024-05-26 DIAGNOSIS — Z5111 Encounter for antineoplastic chemotherapy: Secondary | ICD-10-CM | POA: Diagnosis not present

## 2024-05-26 LAB — RAD ONC ARIA SESSION SUMMARY
Course Elapsed Days: 15
Plan Fractions Treated to Date: 11
Plan Prescribed Dose Per Fraction: 2 Gy
Plan Total Fractions Prescribed: 30
Plan Total Prescribed Dose: 60 Gy
Reference Point Dosage Given to Date: 22 Gy
Reference Point Session Dosage Given: 2 Gy
Session Number: 11

## 2024-05-27 ENCOUNTER — Ambulatory Visit
Admission: RE | Admit: 2024-05-27 | Discharge: 2024-05-27 | Discharge status: Home / Self Care | Source: Ambulatory Visit | Attending: Radiation Oncology | Admitting: Radiation Oncology

## 2024-05-27 ENCOUNTER — Other Ambulatory Visit: Payer: Self-pay

## 2024-05-27 DIAGNOSIS — R5383 Other fatigue: Secondary | ICD-10-CM | POA: Diagnosis not present

## 2024-05-27 DIAGNOSIS — Z5111 Encounter for antineoplastic chemotherapy: Secondary | ICD-10-CM | POA: Diagnosis not present

## 2024-05-27 DIAGNOSIS — D696 Thrombocytopenia, unspecified: Secondary | ICD-10-CM | POA: Diagnosis not present

## 2024-05-27 DIAGNOSIS — R634 Abnormal weight loss: Secondary | ICD-10-CM | POA: Diagnosis not present

## 2024-05-27 DIAGNOSIS — C3431 Malignant neoplasm of lower lobe, right bronchus or lung: Secondary | ICD-10-CM | POA: Diagnosis not present

## 2024-05-27 DIAGNOSIS — Z510 Encounter for antineoplastic radiation therapy: Secondary | ICD-10-CM | POA: Diagnosis not present

## 2024-05-27 LAB — RAD ONC ARIA SESSION SUMMARY
Course Elapsed Days: 16
Plan Fractions Treated to Date: 12
Plan Prescribed Dose Per Fraction: 2 Gy
Plan Total Fractions Prescribed: 30
Plan Total Prescribed Dose: 60 Gy
Reference Point Dosage Given to Date: 24 Gy
Reference Point Session Dosage Given: 2 Gy
Session Number: 12

## 2024-05-28 ENCOUNTER — Ambulatory Visit
Admission: RE | Admit: 2024-05-28 | Discharge: 2024-05-28 | Disposition: A | Discharge status: Home / Self Care | Source: Ambulatory Visit | Attending: Radiation Oncology | Admitting: Radiation Oncology

## 2024-05-28 ENCOUNTER — Other Ambulatory Visit: Payer: Self-pay

## 2024-05-28 DIAGNOSIS — Z510 Encounter for antineoplastic radiation therapy: Secondary | ICD-10-CM | POA: Diagnosis not present

## 2024-05-28 DIAGNOSIS — R5383 Other fatigue: Secondary | ICD-10-CM | POA: Diagnosis not present

## 2024-05-28 DIAGNOSIS — Z5111 Encounter for antineoplastic chemotherapy: Secondary | ICD-10-CM | POA: Diagnosis not present

## 2024-05-28 DIAGNOSIS — R634 Abnormal weight loss: Secondary | ICD-10-CM | POA: Diagnosis not present

## 2024-05-28 DIAGNOSIS — D696 Thrombocytopenia, unspecified: Secondary | ICD-10-CM | POA: Diagnosis not present

## 2024-05-28 DIAGNOSIS — C3431 Malignant neoplasm of lower lobe, right bronchus or lung: Secondary | ICD-10-CM | POA: Diagnosis not present

## 2024-05-28 LAB — RAD ONC ARIA SESSION SUMMARY
Course Elapsed Days: 17
Plan Fractions Treated to Date: 13
Plan Prescribed Dose Per Fraction: 2 Gy
Plan Total Fractions Prescribed: 30
Plan Total Prescribed Dose: 60 Gy
Reference Point Dosage Given to Date: 26 Gy
Reference Point Session Dosage Given: 2 Gy
Session Number: 13

## 2024-05-31 ENCOUNTER — Inpatient Hospital Stay

## 2024-05-31 ENCOUNTER — Ambulatory Visit
Admission: RE | Admit: 2024-05-31 | Discharge: 2024-05-31 | Disposition: A | Discharge status: Home / Self Care | Source: Ambulatory Visit | Attending: Radiation Oncology | Admitting: Radiation Oncology

## 2024-05-31 ENCOUNTER — Other Ambulatory Visit: Payer: Self-pay

## 2024-05-31 VITALS — BP 115/72 | HR 90 | Temp 98.5°F | Resp 14 | Wt 207.5 lb

## 2024-05-31 DIAGNOSIS — Z510 Encounter for antineoplastic radiation therapy: Secondary | ICD-10-CM | POA: Diagnosis not present

## 2024-05-31 DIAGNOSIS — C3431 Malignant neoplasm of lower lobe, right bronchus or lung: Secondary | ICD-10-CM

## 2024-05-31 DIAGNOSIS — R5383 Other fatigue: Secondary | ICD-10-CM | POA: Diagnosis not present

## 2024-05-31 DIAGNOSIS — D696 Thrombocytopenia, unspecified: Secondary | ICD-10-CM | POA: Diagnosis not present

## 2024-05-31 DIAGNOSIS — Z5111 Encounter for antineoplastic chemotherapy: Secondary | ICD-10-CM | POA: Diagnosis not present

## 2024-05-31 DIAGNOSIS — R634 Abnormal weight loss: Secondary | ICD-10-CM | POA: Diagnosis not present

## 2024-05-31 LAB — RAD ONC ARIA SESSION SUMMARY
Course Elapsed Days: 20
Plan Fractions Treated to Date: 14
Plan Prescribed Dose Per Fraction: 2 Gy
Plan Total Fractions Prescribed: 30
Plan Total Prescribed Dose: 60 Gy
Reference Point Dosage Given to Date: 28 Gy
Reference Point Session Dosage Given: 2 Gy
Session Number: 14

## 2024-05-31 LAB — CBC WITH DIFFERENTIAL (CANCER CENTER ONLY)
Abs Immature Granulocytes: 0.01 K/uL (ref 0.00–0.07)
Basophils Absolute: 0 K/uL (ref 0.0–0.1)
Basophils Relative: 0 %
Eosinophils Absolute: 0 K/uL (ref 0.0–0.5)
Eosinophils Relative: 0 %
HCT: 30.1 % — ABNORMAL LOW (ref 39.0–52.0)
Hemoglobin: 10.3 g/dL — ABNORMAL LOW (ref 13.0–17.0)
Immature Granulocytes: 1 %
Lymphocytes Relative: 19 %
Lymphs Abs: 0.4 K/uL — ABNORMAL LOW (ref 0.7–4.0)
MCH: 34.3 pg — ABNORMAL HIGH (ref 26.0–34.0)
MCHC: 34.2 g/dL (ref 30.0–36.0)
MCV: 100.3 fL — ABNORMAL HIGH (ref 80.0–100.0)
Monocytes Absolute: 0.1 K/uL (ref 0.1–1.0)
Monocytes Relative: 6 %
Neutro Abs: 1.5 K/uL — ABNORMAL LOW (ref 1.7–7.7)
Neutrophils Relative %: 74 %
Platelet Count: 101 K/uL — ABNORMAL LOW (ref 150–400)
RBC: 3 MIL/uL — ABNORMAL LOW (ref 4.22–5.81)
RDW: 15.8 % — ABNORMAL HIGH (ref 11.5–15.5)
WBC Count: 2 K/uL — ABNORMAL LOW (ref 4.0–10.5)
nRBC: 0 % (ref 0.0–0.2)

## 2024-05-31 LAB — CMP (CANCER CENTER ONLY)
ALT: 18 U/L (ref 0–44)
AST: 19 U/L (ref 15–41)
Albumin: 3.9 g/dL (ref 3.5–5.0)
Alkaline Phosphatase: 92 U/L (ref 38–126)
Anion gap: 8 (ref 5–15)
BUN: 23 mg/dL (ref 8–23)
CO2: 24 mmol/L (ref 22–32)
Calcium: 9.1 mg/dL (ref 8.9–10.3)
Chloride: 109 mmol/L (ref 98–111)
Creatinine: 1.11 mg/dL (ref 0.61–1.24)
GFR, Estimated: >60 mL/min (ref >60)
Glucose, Bld: 166 mg/dL — ABNORMAL HIGH (ref 70–99)
Potassium: 4.6 mmol/L (ref 3.5–5.1)
Sodium: 141 mmol/L (ref 135–145)
Total Bilirubin: 0.6 mg/dL (ref 0.0–1.2)
Total Protein: 6.5 g/dL (ref 6.5–8.1)

## 2024-05-31 LAB — SAMPLE TO BLOOD BANK

## 2024-05-31 MED ORDER — FAMOTIDINE IN NACL 20-0.9 MG/50ML-% IV SOLN
20.0000 mg | Freq: Once | INTRAVENOUS | Status: AC
  Administered 2024-05-31: 20 mg via INTRAVENOUS
  Filled 2024-05-31: qty 50

## 2024-05-31 MED ORDER — SODIUM CHLORIDE 0.9 % IV SOLN
207.2000 mg | Freq: Once | INTRAVENOUS | Status: AC
  Administered 2024-05-31: 210 mg via INTRAVENOUS
  Filled 2024-05-31: qty 21

## 2024-05-31 MED ORDER — DIPHENHYDRAMINE HCL 50 MG/ML IJ SOLN
50.0000 mg | Freq: Once | INTRAMUSCULAR | Status: AC
  Administered 2024-05-31: 50 mg via INTRAVENOUS
  Filled 2024-05-31: qty 1

## 2024-05-31 MED ORDER — SODIUM CHLORIDE 0.9 % IV SOLN
45.0000 mg/m2 | Freq: Once | INTRAVENOUS | Status: AC
  Administered 2024-05-31: 96 mg via INTRAVENOUS
  Filled 2024-05-31: qty 16

## 2024-05-31 MED ORDER — SODIUM CHLORIDE 0.9 % IV SOLN: INTRAVENOUS | Status: DC

## 2024-05-31 MED ORDER — DEXAMETHASONE SODIUM PHOSPHATE 10 MG/ML IJ SOLN
10.0000 mg | Freq: Once | INTRAMUSCULAR | Status: AC
  Administered 2024-05-31: 10 mg via INTRAVENOUS
  Filled 2024-05-31: qty 1

## 2024-05-31 MED ORDER — PALONOSETRON HCL INJECTION 0.25 MG/5ML
0.2500 mg | Freq: Once | INTRAVENOUS | Status: AC
  Administered 2024-05-31: 0.25 mg via INTRAVENOUS
  Filled 2024-05-31: qty 5

## 2024-05-31 NOTE — Patient Instructions (Signed)
 [facility] - A DEPT OF [facility]  Discharge Instructions: Thank you for choosing [facility] to provide your oncology and hematology care.   If you have a lab appointment with the Cancer Center, please go directly to the Cancer Center and check in at the registration area.   Wear comfortable clothing and clothing appropriate for easy access to any Portacath or PICC line.   We strive to give you quality time with your provider. You may need to reschedule your appointment if you arrive late (15 or more minutes).  Arriving late affects you and other patients whose appointments are after yours.  Also, if you miss three or more appointments without notifying the office, you may be dismissed from the clinic at the provider's discretion.      For prescription refill requests, have your pharmacy contact our office and allow 72 hours for refills to be completed.    Today you received the following chemotherapy and/or immunotherapy agents:   Paclitaxel, Carboplatin    To help prevent nausea and vomiting after your treatment, we encourage you to take your nausea medication as directed.  BELOW ARE SYMPTOMS THAT SHOULD BE REPORTED IMMEDIATELY: *FEVER GREATER THAN 100.4 F (38 C) OR HIGHER *CHILLS OR SWEATING *NAUSEA AND VOMITING THAT IS NOT CONTROLLED WITH YOUR NAUSEA MEDICATION *UNUSUAL SHORTNESS OF BREATH *UNUSUAL BRUISING OR BLEEDING *URINARY PROBLEMS (pain or burning when urinating, or frequent urination) *BOWEL PROBLEMS (unusual diarrhea, constipation, pain near the anus) TENDERNESS IN MOUTH AND THROAT WITH OR WITHOUT PRESENCE OF ULCERS (sore throat, sores in mouth, or a toothache) UNUSUAL RASH, SWELLING OR PAIN  UNUSUAL VAGINAL DISCHARGE OR ITCHING   Items with * indicate a potential emergency and should be followed up as soon as possible or go to the Emergency Department if any problems should occur.  Please show the CHEMOTHERAPY ALERT CARD or  IMMUNOTHERAPY ALERT CARD at check-in to the Emergency Department and triage nurse.  Should you have questions after your visit or need to cancel or reschedule your appointment, please contact [facility] - A DEPT [facility]  Dept: [phone]  and follow the prompts.  Office hours are 8:00 a.m. to 4:30 p.m. Monday - Friday. Please note that voicemails left after 4:00 p.m. may not be returned until the following business day.  We are closed weekends and major holidays. You have access to a nurse at all times for urgent questions. Please call the main number to the clinic Dept: [phone] and follow the prompts.   For any non-urgent questions, you may also contact your provider using MyChart. We now offer e-Visits for anyone 63 and older to request care online for non-urgent symptoms. For details visit [email or web address].   Also download the MyChart app! Go to the app store, search "MyChart", open the app, select [place], and log in with your MyChart username and password.

## 2024-06-01 ENCOUNTER — Ambulatory Visit
Admission: RE | Admit: 2024-06-01 | Discharge: 2024-06-01 | Disposition: A | Discharge status: Home / Self Care | Source: Ambulatory Visit | Attending: Radiation Oncology | Admitting: Radiation Oncology

## 2024-06-01 ENCOUNTER — Other Ambulatory Visit: Payer: Self-pay

## 2024-06-01 ENCOUNTER — Ambulatory Visit
Admission: RE | Admit: 2024-06-01 | Discharge: 2024-06-01 | Discharge status: Home / Self Care | Source: Ambulatory Visit | Attending: Radiation Oncology | Admitting: Radiation Oncology

## 2024-06-01 DIAGNOSIS — D696 Thrombocytopenia, unspecified: Secondary | ICD-10-CM | POA: Diagnosis not present

## 2024-06-01 DIAGNOSIS — J9601 Acute respiratory failure with hypoxia: Secondary | ICD-10-CM

## 2024-06-01 DIAGNOSIS — R5383 Other fatigue: Secondary | ICD-10-CM | POA: Diagnosis not present

## 2024-06-01 DIAGNOSIS — Z5111 Encounter for antineoplastic chemotherapy: Secondary | ICD-10-CM | POA: Diagnosis not present

## 2024-06-01 DIAGNOSIS — Z510 Encounter for antineoplastic radiation therapy: Secondary | ICD-10-CM | POA: Diagnosis not present

## 2024-06-01 DIAGNOSIS — C3431 Malignant neoplasm of lower lobe, right bronchus or lung: Secondary | ICD-10-CM | POA: Diagnosis not present

## 2024-06-01 DIAGNOSIS — R634 Abnormal weight loss: Secondary | ICD-10-CM | POA: Diagnosis not present

## 2024-06-01 LAB — RAD ONC ARIA SESSION SUMMARY
Course Elapsed Days: 21
Plan Fractions Treated to Date: 15
Plan Prescribed Dose Per Fraction: 2 Gy
Plan Total Fractions Prescribed: 30
Plan Total Prescribed Dose: 60 Gy
Reference Point Dosage Given to Date: 30 Gy
Reference Point Session Dosage Given: 2 Gy
Session Number: 15

## 2024-06-01 MED ORDER — SONAFINE EX EMUL
1.0000 | Freq: Two times a day (BID) | CUTANEOUS | Status: DC
  Administered 2024-06-01: 1 via TOPICAL

## 2024-06-02 ENCOUNTER — Ambulatory Visit
Admission: RE | Admit: 2024-06-02 | Discharge: 2024-06-02 | Disposition: A | Discharge status: Home / Self Care | Source: Ambulatory Visit | Attending: Radiation Oncology | Admitting: Radiation Oncology

## 2024-06-02 ENCOUNTER — Other Ambulatory Visit: Payer: Self-pay

## 2024-06-02 DIAGNOSIS — D696 Thrombocytopenia, unspecified: Secondary | ICD-10-CM | POA: Diagnosis not present

## 2024-06-02 DIAGNOSIS — Z5111 Encounter for antineoplastic chemotherapy: Secondary | ICD-10-CM | POA: Diagnosis not present

## 2024-06-02 DIAGNOSIS — R634 Abnormal weight loss: Secondary | ICD-10-CM | POA: Diagnosis not present

## 2024-06-02 DIAGNOSIS — R5383 Other fatigue: Secondary | ICD-10-CM | POA: Diagnosis not present

## 2024-06-02 DIAGNOSIS — Z510 Encounter for antineoplastic radiation therapy: Secondary | ICD-10-CM | POA: Diagnosis not present

## 2024-06-02 DIAGNOSIS — C3431 Malignant neoplasm of lower lobe, right bronchus or lung: Secondary | ICD-10-CM | POA: Diagnosis not present

## 2024-06-02 LAB — RAD ONC ARIA SESSION SUMMARY
Course Elapsed Days: 22
Plan Fractions Treated to Date: 16
Plan Prescribed Dose Per Fraction: 2 Gy
Plan Total Fractions Prescribed: 30
Plan Total Prescribed Dose: 60 Gy
Reference Point Dosage Given to Date: 32 Gy
Reference Point Session Dosage Given: 2 Gy
Session Number: 16

## 2024-06-03 ENCOUNTER — Other Ambulatory Visit: Payer: Self-pay

## 2024-06-03 ENCOUNTER — Ambulatory Visit
Admission: RE | Admit: 2024-06-03 | Discharge: 2024-06-03 | Disposition: A | Discharge status: Home / Self Care | Source: Ambulatory Visit | Attending: Radiation Oncology | Admitting: Radiation Oncology

## 2024-06-03 DIAGNOSIS — R5383 Other fatigue: Secondary | ICD-10-CM | POA: Diagnosis not present

## 2024-06-03 DIAGNOSIS — Z5111 Encounter for antineoplastic chemotherapy: Secondary | ICD-10-CM | POA: Diagnosis not present

## 2024-06-03 DIAGNOSIS — Z510 Encounter for antineoplastic radiation therapy: Secondary | ICD-10-CM | POA: Diagnosis not present

## 2024-06-03 DIAGNOSIS — R634 Abnormal weight loss: Secondary | ICD-10-CM | POA: Diagnosis not present

## 2024-06-03 DIAGNOSIS — D696 Thrombocytopenia, unspecified: Secondary | ICD-10-CM | POA: Diagnosis not present

## 2024-06-03 DIAGNOSIS — C3431 Malignant neoplasm of lower lobe, right bronchus or lung: Secondary | ICD-10-CM | POA: Diagnosis not present

## 2024-06-03 LAB — RAD ONC ARIA SESSION SUMMARY
Course Elapsed Days: 23
Plan Fractions Treated to Date: 17
Plan Prescribed Dose Per Fraction: 2 Gy
Plan Total Fractions Prescribed: 30
Plan Total Prescribed Dose: 60 Gy
Reference Point Dosage Given to Date: 34 Gy
Reference Point Session Dosage Given: 2 Gy
Session Number: 17

## 2024-06-04 ENCOUNTER — Other Ambulatory Visit: Payer: Self-pay

## 2024-06-04 ENCOUNTER — Ambulatory Visit
Admission: RE | Admit: 2024-06-04 | Discharge: 2024-06-04 | Disposition: A | Discharge status: Home / Self Care | Source: Ambulatory Visit | Attending: Radiation Oncology | Admitting: Radiation Oncology

## 2024-06-04 DIAGNOSIS — Z5111 Encounter for antineoplastic chemotherapy: Secondary | ICD-10-CM | POA: Diagnosis not present

## 2024-06-04 DIAGNOSIS — R634 Abnormal weight loss: Secondary | ICD-10-CM | POA: Diagnosis not present

## 2024-06-04 DIAGNOSIS — Z510 Encounter for antineoplastic radiation therapy: Secondary | ICD-10-CM | POA: Diagnosis not present

## 2024-06-04 DIAGNOSIS — R5383 Other fatigue: Secondary | ICD-10-CM | POA: Diagnosis not present

## 2024-06-04 DIAGNOSIS — C3431 Malignant neoplasm of lower lobe, right bronchus or lung: Secondary | ICD-10-CM | POA: Diagnosis not present

## 2024-06-04 DIAGNOSIS — D696 Thrombocytopenia, unspecified: Secondary | ICD-10-CM | POA: Diagnosis not present

## 2024-06-04 LAB — RAD ONC ARIA SESSION SUMMARY
Course Elapsed Days: 24
Plan Fractions Treated to Date: 18
Plan Prescribed Dose Per Fraction: 2 Gy
Plan Total Fractions Prescribed: 30
Plan Total Prescribed Dose: 60 Gy
Reference Point Dosage Given to Date: 36 Gy
Reference Point Session Dosage Given: 2 Gy
Session Number: 18

## 2024-06-07 ENCOUNTER — Inpatient Hospital Stay

## 2024-06-07 ENCOUNTER — Inpatient Hospital Stay (HOSPITAL_BASED_OUTPATIENT_CLINIC_OR_DEPARTMENT_OTHER): Admitting: Internal Medicine

## 2024-06-07 ENCOUNTER — Other Ambulatory Visit: Payer: Self-pay

## 2024-06-07 ENCOUNTER — Ambulatory Visit
Admission: RE | Admit: 2024-06-07 | Discharge: 2024-06-07 | Disposition: A | Discharge status: Home / Self Care | Source: Ambulatory Visit | Attending: Radiation Oncology | Admitting: Radiation Oncology

## 2024-06-07 VITALS — BP 115/73 | HR 100 | Temp 98.0°F | Resp 17 | Ht 68.0 in | Wt 207.0 lb

## 2024-06-07 DIAGNOSIS — R634 Abnormal weight loss: Secondary | ICD-10-CM | POA: Diagnosis not present

## 2024-06-07 DIAGNOSIS — Z5111 Encounter for antineoplastic chemotherapy: Secondary | ICD-10-CM | POA: Diagnosis not present

## 2024-06-07 DIAGNOSIS — C3431 Malignant neoplasm of lower lobe, right bronchus or lung: Secondary | ICD-10-CM | POA: Diagnosis not present

## 2024-06-07 DIAGNOSIS — R5383 Other fatigue: Secondary | ICD-10-CM | POA: Diagnosis not present

## 2024-06-07 DIAGNOSIS — D696 Thrombocytopenia, unspecified: Secondary | ICD-10-CM | POA: Diagnosis not present

## 2024-06-07 DIAGNOSIS — Z510 Encounter for antineoplastic radiation therapy: Secondary | ICD-10-CM | POA: Diagnosis not present

## 2024-06-07 LAB — CBC WITH DIFFERENTIAL (CANCER CENTER ONLY)
Abs Immature Granulocytes: 0.01 K/uL (ref 0.00–0.07)
Basophils Absolute: 0 K/uL (ref 0.0–0.1)
Basophils Relative: 0 %
Eosinophils Absolute: 0 K/uL (ref 0.0–0.5)
Eosinophils Relative: 0 %
HCT: 28.8 % — ABNORMAL LOW (ref 39.0–52.0)
Hemoglobin: 10.1 g/dL — ABNORMAL LOW (ref 13.0–17.0)
Immature Granulocytes: 1 %
Lymphocytes Relative: 20 %
Lymphs Abs: 0.3 K/uL — ABNORMAL LOW (ref 0.7–4.0)
MCH: 34.6 pg — ABNORMAL HIGH (ref 26.0–34.0)
MCHC: 35.1 g/dL (ref 30.0–36.0)
MCV: 98.6 fL (ref 80.0–100.0)
Monocytes Absolute: 0.2 K/uL (ref 0.1–1.0)
Monocytes Relative: 13 %
Neutro Abs: 0.9 K/uL — ABNORMAL LOW (ref 1.7–7.7)
Neutrophils Relative %: 66 %
Platelet Count: 101 K/uL — ABNORMAL LOW (ref 150–400)
RBC: 2.92 MIL/uL — ABNORMAL LOW (ref 4.22–5.81)
RDW: 16.1 % — ABNORMAL HIGH (ref 11.5–15.5)
WBC Count: 1.3 K/uL — ABNORMAL LOW (ref 4.0–10.5)
nRBC: 3.1 % — ABNORMAL HIGH (ref 0.0–0.2)

## 2024-06-07 LAB — RAD ONC ARIA SESSION SUMMARY
Course Elapsed Days: 27
Plan Fractions Treated to Date: 19
Plan Prescribed Dose Per Fraction: 2 Gy
Plan Total Fractions Prescribed: 30
Plan Total Prescribed Dose: 60 Gy
Reference Point Dosage Given to Date: 38 Gy
Reference Point Session Dosage Given: 2 Gy
Session Number: 19

## 2024-06-07 LAB — CMP (CANCER CENTER ONLY)
ALT: 16 U/L (ref 0–44)
AST: 18 U/L (ref 15–41)
Albumin: 3.7 g/dL (ref 3.5–5.0)
Alkaline Phosphatase: 88 U/L (ref 38–126)
Anion gap: 8 (ref 5–15)
BUN: 14 mg/dL (ref 8–23)
CO2: 22 mmol/L (ref 22–32)
Calcium: 8.7 mg/dL — ABNORMAL LOW (ref 8.9–10.3)
Chloride: 109 mmol/L (ref 98–111)
Creatinine: 1.07 mg/dL (ref 0.61–1.24)
GFR, Estimated: >60 mL/min (ref >60)
Glucose, Bld: 149 mg/dL — ABNORMAL HIGH (ref 70–99)
Potassium: 4.3 mmol/L (ref 3.5–5.1)
Sodium: 139 mmol/L (ref 135–145)
Total Bilirubin: 0.8 mg/dL (ref 0.0–1.2)
Total Protein: 6.4 g/dL — ABNORMAL LOW (ref 6.5–8.1)

## 2024-06-07 LAB — SAMPLE TO BLOOD BANK

## 2024-06-07 NOTE — Progress Notes (Signed)
 [facility] [phone]   [phone]  OFFICE PROGRESS NOTE  Koirala, [name], MD [address] [place] [address]  DIAGNOSIS: Stage IIIA (T4, N1, M0) non-small cell lung cancer, adenocarcinoma presented with right lower lobe lung mass in addition to suspicious right upper lobe lesion and right hilar lymphadenopathy diagnosed in April 2025.   Biomarker Findings HRD signature - Cannot Be Determined Microsatellite status - Cannot Be Determined ? Tumor Mutational Burden - Cannot Be Determined Genomic Findings For a complete list of the genes assayed, please refer to the Appendix. AKT2 amplification CDKN2A p16INK4a R80* and p14ARF P94L KRAS G12D ASXL1 W796fs*22 GNAS R201H U2AF1 S34F 7 Disease relevant genes with no reportable alterations: ALK, BRAF, EGFR, ERBB2, MET, RET, ROS1  PDL1 TPS: 0%  PRIOR THERAPY: Neoadjuvant systemic chemotherapy with carboplatin  for AUC of 5, Alimta  500 Mg/M2 and Keytruda  200 Mg IV every 3 weeks.  Status post 1 cycle.  This was discontinued secondary to intolerance.  CURRENT THERAPY: A course of concurrent chemoradiation with weekly carboplatin  for AUC of 2 and paclitaxel  45 Mg/M2.  First dose dose 32,025.  Status post 4 cycles.  INTERVAL HISTORY: Peter [name] 78 y.o. male returns to the clinic today for follow-up visit.Discussed the use of AI scribe software for clinical note transcription with the patient, who gave verbal consent to proceed.  History of Present Illness Peter [name] is a 79 year old male with stage three non-small cell lung cancer who presents for evaluation before starting cycle number five of his treatment.  He has stage three non-small cell lung cancer with no actionable mutations and negative PD-L1 expression. Initially, he was treated with neoadjuvant chemoimmunotherapy using carboplatin , Alimta , and Keytruda , but this regimen was discontinued after one cycle due to  intolerance. He is currently undergoing concurrent chemoradiation with weekly carboplatin  and paclitaxel .  He experiences persistent fatigue, which he attributes partly to the heat, limiting his outdoor activities. Despite this, he remains active, occasionally going out for lunch. He has difficulty sleeping due to the steroids, often staying up all night and not sleeping until the following afternoon.  No difficulty swallowing. He has a mild, non-productive cough that occurs primarily in the morning, lasting five to ten minutes. He has experienced weight loss from 219 pounds to 207 pounds, but his weight has stabilized at 207 pounds for the past two weeks.     MEDICAL HISTORY: Past Medical History:  Diagnosis Date   Arthritis    At risk for sleep apnea    STOP-BANG= 5       SENT TO PCP 07-12-2015   CAD (coronary artery disease)    a. s/p PCI with DES x 3 to LAD in 2006 with bifurcation lesion and Plavix  indefinitely was recommended.   Chronic combined systolic and diastolic CHF (congestive heart failure) (HCC)    a. Prior EF normal but EF dropped to 30-35% in 07/2016 in setting of AFIB.   CKD (chronic kidney disease), stage III (HCC)    Depression    Dyslipidemia    Dyspnea    tired quick d/t Afib   Dyspnea on exertion    Dysrhythmia    A.FIB   GERD (gastroesophageal reflux disease)    History of GI bleed    History of kidney stones    History of MI (myocardial infarction)    11-05-2005--  periprocedural MI  (cardiac cath) per dr obie note   Hypertension  Hypertriglyceridemia    Iron deficiency anemia    Myocardial infarction (HCC)    Persistent atrial fibrillation (HCC)    a. 07/2016, started on Eliquis  -> readmitted later that month for AF RVR/acute HF - initially unsuccessful DCCV following TEE, so loaded with amio with repeat successful DCCV 07/2816.   Right ureteral stone    S/P drug eluting coronary stent placement    x3  to LAD  2006   Sleep apnea    wears BIPAP, 14  up to 24    ALLERGIES:  is allergic to prednisone  and spironolactone .  MEDICATIONS:  Current Outpatient Medications  Medication Sig Dispense Refill   albuterol  (VENTOLIN  HFA) 108 (90 Base) MCG/ACT inhaler Inhale 2 puffs into the lungs every 6 (six) hours as needed for wheezing or shortness of breath (intractable cough). (Patient not taking: Reported on 05/10/2024) 8 g 2   allopurinol  (ZYLOPRIM ) 100 MG tablet Take 100 mg by mouth at bedtime.     allopurinol  (ZYLOPRIM ) 300 MG tablet Take 300 mg by mouth in the morning.     atorvastatin  (LIPITOR) 40 MG tablet TAKE 1 TABLET EVERY DAY (NEED MD APPOINTMENT) 90 tablet 0   benzonatate  (TESSALON  PERLES) 100 MG capsule Take 2 capsules (200 mg total) by mouth 2 (two) times daily as needed for cough. (Patient not taking: Reported on 05/10/2024) 30 capsule 1   budeson-glycopyrrolate-formoterol (BREZTRI  AEROSPHERE) 160-9-4.8 MCG/ACT AERO Inhale 2 puffs into the lungs in the morning and at bedtime. (Patient not taking: Reported on 05/10/2024)     carvedilol  (COREG ) 25 MG tablet TAKE 1 TABLET TWICE DAILY WITH MEALS 180 tablet 0   chlorproMAZINE  (THORAZINE ) 10 MG tablet Take 1 tablet (10 mg total) by mouth 3 (three) times daily as needed. 15 tablet 0   Cyanocobalamin  (VITAMIN B-12 PO) Take 1 tablet by mouth daily.     diphenhydramine -acetaminophen  (TYLENOL  PM) 25-500 MG TABS tablet Take 1 tablet by mouth at bedtime.     ferrous sulfate  325 (65 FE) MG tablet Take 1 tablet (325 mg total) by mouth every morning. 90 tablet 2   furosemide  (LASIX ) 40 MG tablet TAKE 1 TABLET EVERY DAY 90 tablet 0   hydrocortisone  1 % lotion Apply 1 Application topically 2 (two) times daily. 118 mL 0   lisinopril  (ZESTRIL ) 40 MG tablet TAKE 1 TABLET EVERY DAY (Patient not taking: Reported on 05/24/2024) 90 tablet 3   nitroGLYCERIN  (NITROSTAT ) 0.4 MG SL tablet Place 1 tablet (0.4 mg total) under the tongue every 5 (five) minutes as needed for chest pain. X 3 doses 25 tablet 5    pantoprazole  (PROTONIX ) 40 MG tablet Take 40 mg by mouth daily.     polyvinyl alcohol  (LIQUIFILM TEARS) 1.4 % ophthalmic solution Place 1 drop into both eyes daily as needed for dry eyes.     rivaroxaban  (XARELTO ) 20 MG TABS tablet Take 1 tablet (20 mg total) by mouth at bedtime. Okay to restart this medication on 02/10/24     sertraline  (ZOLOFT ) 25 MG tablet Take 25 mg by mouth daily.     triamcinolone  cream (KENALOG ) 0.1 % Apply 1 Application topically 2 (two) times daily. 453.6 g 0   No current facility-administered medications for this visit.    SURGICAL HISTORY:  Past Surgical History:  Procedure Laterality Date   BRONCHIAL BIOPSY  02/09/2024   Procedure: BRONCHOSCOPY, WITH BIOPSY;  Surgeon: Shelah Lamar RAMAN, MD;  Location: [facility] ENDOSCOPY;  Service: Pulmonary;;   BRONCHIAL BRUSHINGS  02/09/2024   Procedure: BRONCHOSCOPY, WITH  BRUSH BIOPSY;  Surgeon: Shelah Lamar RAMAN, MD;  Location: [facility] ENDOSCOPY;  Service: Pulmonary;;   BRONCHIAL NEEDLE ASPIRATION BIOPSY  02/09/2024   Procedure: BRONCHOSCOPY, WITH NEEDLE ASPIRATION BIOPSY;  Surgeon: Shelah Lamar RAMAN, MD;  Location: [facility] ENDOSCOPY;  Service: Pulmonary;;   BRONCHIAL WASHINGS  02/09/2024   Procedure: IRRIGATION, BRONCHUS;  Surgeon: Shelah Lamar RAMAN, MD;  Location: [facility] ENDOSCOPY;  Service: Pulmonary;;   CARDIOVASCULAR STRESS TEST  01-06-2013  dr micky   normal perfusion study/  no ischemia or infarct/  normal LV function and wall motion , ef 57%   CARDIOVERSION N/A 08/05/2016   Procedure: CARDIOVERSION;  Surgeon: Jerel Balding, MD;  Location: [facility] ENDOSCOPY;  Service: Cardiovascular;  Laterality: N/A;   CARDIOVERSION N/A 08/08/2016   Procedure: CARDIOVERSION;  Surgeon: Oneil JAYSON Parchment, MD;  Location: [facility] ENDOSCOPY;  Service: Cardiovascular;  Laterality: N/A;   CORONARY ANGIOPLASTY  11-13-2005 dr obie   Successful touch up post-dilatation of the 3 tandem overlying Cypher stents in proximal to mid LAD (based on IVUS 0% stenosis and patent diagonal branch)/   Cutting Balloon Angioplasty of Ramus branch   CORONARY ANGIOPLASTY WITH STENT PLACEMENT  11-05-2005   dr wolm brodie   PCI and DES x3 to birfurcation lesion LAD and diagonal branch of LAD/   20% dLM,  30-40% RCA, ef 60%   CYSTOSCOPY WITH RETROGRADE PYELOGRAM, URETEROSCOPY AND STENT PLACEMENT Right 07/14/2015   Procedure: CYSTOSCOPY WITH RETROGRADE PYELOGRAM, URETEROSCOPY AND STENT PLACEMENT;  Surgeon: Ricardo Likens, MD;  Location: [facility];  Service: Urology;  Laterality: Right;   ELECTROPHYSIOLOGIC STUDY N/A 10/24/2016   Procedure: Atrial Fibrillation Ablation;  Surgeon: Will Gladis Norton, MD;  Location: [facility] INVASIVE CV LAB;  Service: Cardiovascular;  Laterality: N/A;   EXTRACORPOREAL SHOCK WAVE LITHOTRIPSY  yrs ago   HEMORRHOID SURGERY N/A 10/24/2017   Procedure: HEMORRHOIDECTOMY;  Surgeon: Teresa Lonni HERO, MD;  Location: [facility] OR;  Service: General;  Laterality: N/A;   HERNIA REPAIR Bilateral 2000   Inguinal Hernia   IR GENERIC HISTORICAL  10/23/2016   IR FLUORO GUIDE CV LINE RIGHT 10/23/2016 Ozell Specking, MD [facility]INTER[facility]   IR GENERIC HISTORICAL  10/23/2016   IR US  GUIDE VASC ACCESS RIGHT 10/23/2016 Ozell Specking, MD [facility]   LEFT URETEROSCOPIC STONE EXTRACTION  04/10/2006   STONE EXTRACTION WITH BASKET Right 07/14/2015   Procedure: STONE EXTRACTION WITH BASKET;  Surgeon: Ricardo Likens, MD;  Location[facility];  Service: Urology;  Laterality: Right;   TEE WITHOUT CARDIOVERSION N/A 08/05/2016   Procedure: TRANSESOPHAGEAL ECHOCARDIOGRAM (TEE);  Surgeon: Jerel Balding, MD;  Location: [facility] ENDOSCOPY;  Service: Cardiovascular;  Laterality: N/A;   TOTAL HIP ARTHROPLASTY Right 07/16/2022   Procedure: RIGHT TOTAL HIP ARTHROPLASTY ANTERIOR APPROACH;  Surgeon: Jerri Kay HERO, MD;  Location: [facility] OR;  Service: Orthopedics;  Laterality: Right;  3-C   TRANSTHORACIC ECHOCARDIOGRAM  02/04/2012   grade I diastolic dysfunction/  ef 55-60%/  trivial MR and TR/   mild LAE   VIDEO BRONCHOSCOPY WITH ENDOBRONCHIAL NAVIGATION Right 02/09/2024   Procedure: VIDEO BRONCHOSCOPY WITH ENDOBRONCHIAL NAVIGATION;  Surgeon: Shelah Lamar RAMAN, MD;  Location: [facility] ENDOSCOPY;  Service: Pulmonary;  Laterality: Right;  WITH FLOURO    REVIEW OF SYSTEMS:  Constitutional: positive for fatigue and weight loss Eyes: negative Ears, nose, mouth, throat, and face: negative Respiratory: positive for cough Cardiovascular: negative Gastrointestinal: negative Genitourinary:negative Integument/breast: negative Hematologic/lymphatic: negative Musculoskeletal:negative Neurological: negative Behavioral/Psych: negative Endocrine: negative Allergic/Immunologic: negative   PHYSICAL EXAMINATION: General appearance: alert, cooperative,  fatigued, and no distress Head: Normocephalic, without obvious abnormality, atraumatic Neck: no adenopathy, no JVD, supple, symmetrical, trachea midline, and thyroid  not enlarged, symmetric, no tenderness/mass/nodules Lymph nodes: Cervical, supraclavicular, and axillary nodes normal. Resp: clear to auscultation bilaterally Back: symmetric, no curvature. ROM normal. No CVA tenderness. Cardio: regular rate and rhythm, S1, S2 normal, no murmur, click, rub or gallop GI: soft, non-tender; bowel sounds normal; no masses,  no organomegaly Extremities: extremities normal, atraumatic, no cyanosis or edema Neurologic: Alert and oriented X 3, normal strength and tone. Normal symmetric reflexes. Normal coordination and gait  ECOG PERFORMANCE STATUS: 1 - Symptomatic but completely ambulatory  Blood pressure 115/73, pulse 100, temperature 98 F (36.7 C), temperature source Temporal, resp. rate 17, height 5' 8 (1.727 m), weight 207 lb (93.9 kg), SpO2 97%.  LABORATORY DATA: Lab Results  Component Value Date   WBC 1.3 (L) 06/07/2024   HGB 10.1 (L) 06/07/2024   HCT 28.8 (L) 06/07/2024   MCV 98.6 06/07/2024   PLT 101 (L) 06/07/2024      Chemistry       Component Value Date/Time   NA 141 05/31/2024 0810   K 4.6 05/31/2024 0810   CL 109 05/31/2024 0810   CO2 24 05/31/2024 0810   BUN 23 05/31/2024 0810   CREATININE 1.11 05/31/2024 0810   CREATININE 1.72 (H) 10/16/2016 1115      Component Value Date/Time   CALCIUM  9.1 05/31/2024 0810   ALKPHOS 92 05/31/2024 0810   AST 19 05/31/2024 0810   ALT 18 05/31/2024 0810   BILITOT 0.6 05/31/2024 0810       RADIOGRAPHIC STUDIES: No results found.   ASSESSMENT AND PLAN: This is a very pleasant 78 years old white male with Stage IIIA (T4, N1, M0) non-small cell lung cancer, adenocarcinoma presented with right lower lobe lung mass in addition to suspicious right upper lobe lesion and right hilar lymphadenopathy diagnosed in April 2025.  Molecular studies showed no actionable mutations and PD-L1 expression was negative. The patient is currently undergoing neoadjuvant chemoimmunotherapy with carboplatin  AUC of 5, Alimta  500 Mg/M2 and Keytruda  200 Mg IV every 3 weeks status post 1 cycle.  He tolerated the first cycle of his treatment well except for mild fatigue and mild skin rash on the face. The patient has intolerance to the neoadjuvant chemoimmunotherapy and this was discontinued. I had a lengthy discussion with him about his current condition and treatment options.  I discussed with him consideration of treatment with a course of concurrent chemoradiation with weekly carboplatin  for AUC of 2 and paclitaxel  45 Mg/M2.  First dose expected May 10, 2024.  Status post 4 cycles.  Assessment and Plan Assessment & Plan Stage 3 non-small cell lung cancer Stage 3 non-small cell lung cancer with no actionable mutations and negative PD-L1 expression. Previously treated with neoadjuvant chemoimmunotherapy (carboplatin , Alimta , Keytruda ) for one cycle, discontinued due to intolerance. Currently undergoing concurrent chemoradiation with weekly carboplatin  and paclitaxel . No difficulty swallowing, occasional  morning cough without expectoration. Weight well-managed at 207 lbs for two weeks. Blood counts currently low, delaying today's treatment. - Continue concurrent chemoradiation with weekly carboplatin  and paclitaxel . - Check if radiation can be administered earlier than scheduled at 1 PM. - Order a scan after the next treatment cycle if blood counts are adequate.  Fatigue Persistent fatigue, likely related to ongoing chemoradiation treatment. No indication of acute distress or need for immediate intervention. The patient was advised to call immediately if he has any concerning symptoms in the  interval.  The patient voices understanding of current disease status and treatment options and is in agreement with the current care plan.  All questions were answered. The patient knows to call the clinic with any problems, questions or concerns. We can certainly see the patient much sooner if necessary.  The total time spent in the appointment was 30 minutes.  Disclaimer: This note was dictated with voice recognition software. Similar sounding words can inadvertently be transcribed and may not be corrected upon review.

## 2024-06-08 ENCOUNTER — Ambulatory Visit
Admission: RE | Admit: 2024-06-08 | Discharge: 2024-06-08 | Disposition: A | Discharge status: Home / Self Care | Source: Ambulatory Visit | Attending: Radiation Oncology | Admitting: Radiation Oncology

## 2024-06-08 ENCOUNTER — Other Ambulatory Visit: Payer: Self-pay

## 2024-06-08 DIAGNOSIS — C3431 Malignant neoplasm of lower lobe, right bronchus or lung: Secondary | ICD-10-CM | POA: Diagnosis not present

## 2024-06-08 DIAGNOSIS — Z510 Encounter for antineoplastic radiation therapy: Secondary | ICD-10-CM | POA: Diagnosis not present

## 2024-06-08 DIAGNOSIS — Z5111 Encounter for antineoplastic chemotherapy: Secondary | ICD-10-CM | POA: Diagnosis not present

## 2024-06-08 DIAGNOSIS — D696 Thrombocytopenia, unspecified: Secondary | ICD-10-CM | POA: Diagnosis not present

## 2024-06-08 DIAGNOSIS — R5383 Other fatigue: Secondary | ICD-10-CM | POA: Diagnosis not present

## 2024-06-08 DIAGNOSIS — R634 Abnormal weight loss: Secondary | ICD-10-CM | POA: Diagnosis not present

## 2024-06-08 LAB — RAD ONC ARIA SESSION SUMMARY
Course Elapsed Days: 28
Plan Fractions Treated to Date: 20
Plan Prescribed Dose Per Fraction: 2 Gy
Plan Total Fractions Prescribed: 30
Plan Total Prescribed Dose: 60 Gy
Reference Point Dosage Given to Date: 40 Gy
Reference Point Session Dosage Given: 2 Gy
Session Number: 20

## 2024-06-09 ENCOUNTER — Ambulatory Visit
Admission: RE | Admit: 2024-06-09 | Discharge: 2024-06-09 | Disposition: A | Discharge status: Home / Self Care | Source: Ambulatory Visit | Attending: Radiation Oncology | Admitting: Radiation Oncology

## 2024-06-09 ENCOUNTER — Ambulatory Visit: Admitting: Radiation Oncology

## 2024-06-09 ENCOUNTER — Other Ambulatory Visit: Payer: Self-pay

## 2024-06-09 DIAGNOSIS — C3431 Malignant neoplasm of lower lobe, right bronchus or lung: Secondary | ICD-10-CM | POA: Diagnosis not present

## 2024-06-09 DIAGNOSIS — R634 Abnormal weight loss: Secondary | ICD-10-CM | POA: Diagnosis not present

## 2024-06-09 DIAGNOSIS — D696 Thrombocytopenia, unspecified: Secondary | ICD-10-CM | POA: Diagnosis not present

## 2024-06-09 DIAGNOSIS — Z510 Encounter for antineoplastic radiation therapy: Secondary | ICD-10-CM | POA: Diagnosis not present

## 2024-06-09 DIAGNOSIS — R5383 Other fatigue: Secondary | ICD-10-CM | POA: Diagnosis not present

## 2024-06-09 DIAGNOSIS — Z5111 Encounter for antineoplastic chemotherapy: Secondary | ICD-10-CM | POA: Diagnosis not present

## 2024-06-09 LAB — RAD ONC ARIA SESSION SUMMARY
Course Elapsed Days: 29
Plan Fractions Treated to Date: 21
Plan Prescribed Dose Per Fraction: 2 Gy
Plan Total Fractions Prescribed: 30
Plan Total Prescribed Dose: 60 Gy
Reference Point Dosage Given to Date: 42 Gy
Reference Point Session Dosage Given: 2 Gy
Session Number: 21

## 2024-06-10 ENCOUNTER — Other Ambulatory Visit: Payer: Self-pay

## 2024-06-10 ENCOUNTER — Ambulatory Visit
Admission: RE | Admit: 2024-06-10 | Discharge: 2024-06-10 | Disposition: A | Discharge status: Home / Self Care | Source: Ambulatory Visit | Attending: Radiation Oncology | Admitting: Radiation Oncology

## 2024-06-10 DIAGNOSIS — D696 Thrombocytopenia, unspecified: Secondary | ICD-10-CM | POA: Diagnosis not present

## 2024-06-10 DIAGNOSIS — Z510 Encounter for antineoplastic radiation therapy: Secondary | ICD-10-CM | POA: Diagnosis not present

## 2024-06-10 DIAGNOSIS — Z5111 Encounter for antineoplastic chemotherapy: Secondary | ICD-10-CM | POA: Diagnosis not present

## 2024-06-10 DIAGNOSIS — R5383 Other fatigue: Secondary | ICD-10-CM | POA: Diagnosis not present

## 2024-06-10 DIAGNOSIS — C3431 Malignant neoplasm of lower lobe, right bronchus or lung: Secondary | ICD-10-CM | POA: Diagnosis not present

## 2024-06-10 DIAGNOSIS — R634 Abnormal weight loss: Secondary | ICD-10-CM | POA: Diagnosis not present

## 2024-06-10 LAB — RAD ONC ARIA SESSION SUMMARY
Course Elapsed Days: 30
Plan Fractions Treated to Date: 22
Plan Prescribed Dose Per Fraction: 2 Gy
Plan Total Fractions Prescribed: 30
Plan Total Prescribed Dose: 60 Gy
Reference Point Dosage Given to Date: 44 Gy
Reference Point Session Dosage Given: 2 Gy
Session Number: 22

## 2024-06-11 ENCOUNTER — Other Ambulatory Visit: Payer: Self-pay

## 2024-06-11 ENCOUNTER — Ambulatory Visit
Admission: RE | Admit: 2024-06-11 | Discharge: 2024-06-11 | Disposition: A | Discharge status: Home / Self Care | Source: Ambulatory Visit | Attending: Radiation Oncology | Admitting: Radiation Oncology

## 2024-06-11 DIAGNOSIS — C3431 Malignant neoplasm of lower lobe, right bronchus or lung: Secondary | ICD-10-CM | POA: Diagnosis not present

## 2024-06-11 DIAGNOSIS — Z510 Encounter for antineoplastic radiation therapy: Secondary | ICD-10-CM | POA: Diagnosis not present

## 2024-06-11 LAB — RAD ONC ARIA SESSION SUMMARY
Course Elapsed Days: 31
Plan Fractions Treated to Date: 23
Plan Prescribed Dose Per Fraction: 2 Gy
Plan Total Fractions Prescribed: 30
Plan Total Prescribed Dose: 60 Gy
Reference Point Dosage Given to Date: 46 Gy
Reference Point Session Dosage Given: 2 Gy
Session Number: 23

## 2024-06-14 ENCOUNTER — Inpatient Hospital Stay: Attending: Internal Medicine

## 2024-06-14 ENCOUNTER — Inpatient Hospital Stay

## 2024-06-14 ENCOUNTER — Other Ambulatory Visit: Payer: Self-pay

## 2024-06-14 ENCOUNTER — Ambulatory Visit
Admission: RE | Admit: 2024-06-14 | Discharge: 2024-06-14 | Disposition: A | Discharge status: Home / Self Care | Source: Ambulatory Visit | Attending: Radiation Oncology | Admitting: Radiation Oncology

## 2024-06-14 ENCOUNTER — Encounter: Payer: Self-pay | Admitting: Internal Medicine

## 2024-06-14 VITALS — BP 112/72 | HR 88 | Temp 98.0°F | Resp 17 | Wt 207.0 lb

## 2024-06-14 DIAGNOSIS — C3431 Malignant neoplasm of lower lobe, right bronchus or lung: Secondary | ICD-10-CM | POA: Diagnosis not present

## 2024-06-14 DIAGNOSIS — Z5111 Encounter for antineoplastic chemotherapy: Secondary | ICD-10-CM | POA: Insufficient documentation

## 2024-06-14 DIAGNOSIS — R21 Rash and other nonspecific skin eruption: Secondary | ICD-10-CM | POA: Insufficient documentation

## 2024-06-14 DIAGNOSIS — Z510 Encounter for antineoplastic radiation therapy: Secondary | ICD-10-CM | POA: Diagnosis not present

## 2024-06-14 LAB — CBC WITH DIFFERENTIAL (CANCER CENTER ONLY)
Abs Immature Granulocytes: 0.02 K/uL (ref 0.00–0.07)
Basophils Absolute: 0 K/uL (ref 0.0–0.1)
Basophils Relative: 1 %
Eosinophils Absolute: 0 K/uL (ref 0.0–0.5)
Eosinophils Relative: 0 %
HCT: 29 % — ABNORMAL LOW (ref 39.0–52.0)
Hemoglobin: 9.9 g/dL — ABNORMAL LOW (ref 13.0–17.0)
Immature Granulocytes: 1 %
Lymphocytes Relative: 15 %
Lymphs Abs: 0.3 K/uL — ABNORMAL LOW (ref 0.7–4.0)
MCH: 34.4 pg — ABNORMAL HIGH (ref 26.0–34.0)
MCHC: 34.1 g/dL (ref 30.0–36.0)
MCV: 100.7 fL — ABNORMAL HIGH (ref 80.0–100.0)
Monocytes Absolute: 0.4 K/uL (ref 0.1–1.0)
Monocytes Relative: 20 %
Neutro Abs: 1.1 K/uL — ABNORMAL LOW (ref 1.7–7.7)
Neutrophils Relative %: 63 %
Platelet Count: 106 K/uL — ABNORMAL LOW (ref 150–400)
RBC: 2.88 MIL/uL — ABNORMAL LOW (ref 4.22–5.81)
RDW: 17.2 % — ABNORMAL HIGH (ref 11.5–15.5)
WBC Count: 1.8 K/uL — ABNORMAL LOW (ref 4.0–10.5)
nRBC: 1.1 % — ABNORMAL HIGH (ref 0.0–0.2)

## 2024-06-14 LAB — CMP (CANCER CENTER ONLY)
ALT: 19 U/L (ref 0–44)
AST: 23 U/L (ref 15–41)
Albumin: 3.9 g/dL (ref 3.5–5.0)
Alkaline Phosphatase: 79 U/L (ref 38–126)
Anion gap: 7 (ref 5–15)
BUN: 20 mg/dL (ref 8–23)
CO2: 23 mmol/L (ref 22–32)
Calcium: 8.6 mg/dL — ABNORMAL LOW (ref 8.9–10.3)
Chloride: 109 mmol/L (ref 98–111)
Creatinine: 1.02 mg/dL (ref 0.61–1.24)
GFR, Estimated: >60 mL/min (ref >60)
Glucose, Bld: 184 mg/dL — ABNORMAL HIGH (ref 70–99)
Potassium: 4.4 mmol/L (ref 3.5–5.1)
Sodium: 139 mmol/L (ref 135–145)
Total Bilirubin: 0.6 mg/dL (ref 0.0–1.2)
Total Protein: 6.4 g/dL — ABNORMAL LOW (ref 6.5–8.1)

## 2024-06-14 LAB — RAD ONC ARIA SESSION SUMMARY
Course Elapsed Days: 34
Plan Fractions Treated to Date: 24
Plan Prescribed Dose Per Fraction: 2 Gy
Plan Total Fractions Prescribed: 30
Plan Total Prescribed Dose: 60 Gy
Reference Point Dosage Given to Date: 48 Gy
Reference Point Session Dosage Given: 2 Gy
Session Number: 24

## 2024-06-14 LAB — SAMPLE TO BLOOD BANK

## 2024-06-14 MED ORDER — SODIUM CHLORIDE 0.9 % IV SOLN
45.0000 mg/m2 | Freq: Once | INTRAVENOUS | Status: AC
  Administered 2024-06-14: 96 mg via INTRAVENOUS
  Filled 2024-06-14: qty 16

## 2024-06-14 MED ORDER — DEXAMETHASONE SODIUM PHOSPHATE 10 MG/ML IJ SOLN
10.0000 mg | Freq: Once | INTRAMUSCULAR | Status: AC
  Administered 2024-06-14: 10 mg via INTRAVENOUS
  Filled 2024-06-14: qty 1

## 2024-06-14 MED ORDER — SODIUM CHLORIDE 0.9 % IV SOLN
207.2000 mg | Freq: Once | INTRAVENOUS | Status: AC
  Administered 2024-06-14: 210 mg via INTRAVENOUS
  Filled 2024-06-14: qty 21

## 2024-06-14 MED ORDER — DIPHENHYDRAMINE HCL 50 MG/ML IJ SOLN
50.0000 mg | Freq: Once | INTRAMUSCULAR | Status: AC
  Administered 2024-06-14: 50 mg via INTRAVENOUS
  Filled 2024-06-14: qty 1

## 2024-06-14 MED ORDER — SODIUM CHLORIDE 0.9 % IV SOLN
INTRAVENOUS | Status: DC
Start: 2024-06-14 — End: 2024-06-14

## 2024-06-14 MED ORDER — FAMOTIDINE IN NACL 20-0.9 MG/50ML-% IV SOLN
20.0000 mg | Freq: Once | INTRAVENOUS | Status: AC
  Administered 2024-06-14: 20 mg via INTRAVENOUS
  Filled 2024-06-14: qty 50

## 2024-06-14 MED ORDER — PALONOSETRON HCL INJECTION 0.25 MG/5ML
0.2500 mg | Freq: Once | INTRAVENOUS | Status: AC
  Administered 2024-06-14: 0.25 mg via INTRAVENOUS
  Filled 2024-06-14: qty 5

## 2024-06-14 NOTE — Patient Instructions (Signed)
 [facility] - A DEPT OF [facility]  Discharge Instructions: Thank you for choosing [facility] to provide your oncology and hematology care.   If you have a lab appointment with the Cancer Center, please go directly to the Cancer Center and check in at the registration area.   Wear comfortable clothing and clothing appropriate for easy access to any Portacath or PICC line.   We strive to give you quality time with your provider. You may need to reschedule your appointment if you arrive late (15 or more minutes).  Arriving late affects you and other patients whose appointments are after yours.  Also, if you miss three or more appointments without notifying the office, you may be dismissed from the clinic at the provider's discretion.      For prescription refill requests, have your pharmacy contact our office and allow 72 hours for refills to be completed.    Today you received the following chemotherapy and/or immunotherapy agents:   Paclitaxel, Carboplatin    To help prevent nausea and vomiting after your treatment, we encourage you to take your nausea medication as directed.  BELOW ARE SYMPTOMS THAT SHOULD BE REPORTED IMMEDIATELY: *FEVER GREATER THAN 100.4 F (38 C) OR HIGHER *CHILLS OR SWEATING *NAUSEA AND VOMITING THAT IS NOT CONTROLLED WITH YOUR NAUSEA MEDICATION *UNUSUAL SHORTNESS OF BREATH *UNUSUAL BRUISING OR BLEEDING *URINARY PROBLEMS (pain or burning when urinating, or frequent urination) *BOWEL PROBLEMS (unusual diarrhea, constipation, pain near the anus) TENDERNESS IN MOUTH AND THROAT WITH OR WITHOUT PRESENCE OF ULCERS (sore throat, sores in mouth, or a toothache) UNUSUAL RASH, SWELLING OR PAIN  UNUSUAL VAGINAL DISCHARGE OR ITCHING   Items with * indicate a potential emergency and should be followed up as soon as possible or go to the Emergency Department if any problems should occur.  Please show the CHEMOTHERAPY ALERT CARD or  IMMUNOTHERAPY ALERT CARD at check-in to the Emergency Department and triage nurse.  Should you have questions after your visit or need to cancel or reschedule your appointment, please contact [facility] - A DEPT [facility]  Dept: [phone]  and follow the prompts.  Office hours are 8:00 a.m. to 4:30 p.m. Monday - Friday. Please note that voicemails left after 4:00 p.m. may not be returned until the following business day.  We are closed weekends and major holidays. You have access to a nurse at all times for urgent questions. Please call the main number to the clinic Dept: [phone] and follow the prompts.   For any non-urgent questions, you may also contact your provider using MyChart. We now offer e-Visits for anyone 63 and older to request care online for non-urgent symptoms. For details visit [email or web address].   Also download the MyChart app! Go to the app store, search "MyChart", open the app, select [place], and log in with your MyChart username and password.

## 2024-06-15 ENCOUNTER — Ambulatory Visit
Admission: RE | Admit: 2024-06-15 | Discharge: 2024-06-15 | Disposition: A | Discharge status: Home / Self Care | Source: Ambulatory Visit | Attending: Radiation Oncology | Admitting: Radiation Oncology

## 2024-06-15 ENCOUNTER — Other Ambulatory Visit: Payer: Self-pay

## 2024-06-15 DIAGNOSIS — Z510 Encounter for antineoplastic radiation therapy: Secondary | ICD-10-CM | POA: Diagnosis not present

## 2024-06-15 DIAGNOSIS — C3431 Malignant neoplasm of lower lobe, right bronchus or lung: Secondary | ICD-10-CM | POA: Diagnosis not present

## 2024-06-15 LAB — RAD ONC ARIA SESSION SUMMARY
Course Elapsed Days: 35
Plan Fractions Treated to Date: 25
Plan Prescribed Dose Per Fraction: 2 Gy
Plan Total Fractions Prescribed: 30
Plan Total Prescribed Dose: 60 Gy
Reference Point Dosage Given to Date: 50 Gy
Reference Point Session Dosage Given: 2 Gy
Session Number: 25

## 2024-06-16 ENCOUNTER — Other Ambulatory Visit: Payer: Self-pay

## 2024-06-16 ENCOUNTER — Ambulatory Visit
Admission: RE | Admit: 2024-06-16 | Discharge: 2024-06-16 | Disposition: A | Discharge status: Home / Self Care | Source: Ambulatory Visit | Attending: Radiation Oncology | Admitting: Radiation Oncology

## 2024-06-16 DIAGNOSIS — Z510 Encounter for antineoplastic radiation therapy: Secondary | ICD-10-CM | POA: Diagnosis not present

## 2024-06-16 DIAGNOSIS — C3431 Malignant neoplasm of lower lobe, right bronchus or lung: Secondary | ICD-10-CM | POA: Diagnosis not present

## 2024-06-16 LAB — RAD ONC ARIA SESSION SUMMARY
Course Elapsed Days: 36
Plan Fractions Treated to Date: 26
Plan Prescribed Dose Per Fraction: 2 Gy
Plan Total Fractions Prescribed: 30
Plan Total Prescribed Dose: 60 Gy
Reference Point Dosage Given to Date: 52 Gy
Reference Point Session Dosage Given: 2 Gy
Session Number: 26

## 2024-06-17 ENCOUNTER — Other Ambulatory Visit: Payer: Self-pay

## 2024-06-17 ENCOUNTER — Ambulatory Visit
Admission: RE | Admit: 2024-06-17 | Discharge: 2024-06-17 | Disposition: A | Discharge status: Home / Self Care | Source: Ambulatory Visit | Attending: Radiation Oncology | Admitting: Radiation Oncology

## 2024-06-17 DIAGNOSIS — Z510 Encounter for antineoplastic radiation therapy: Secondary | ICD-10-CM | POA: Diagnosis not present

## 2024-06-17 DIAGNOSIS — C3431 Malignant neoplasm of lower lobe, right bronchus or lung: Secondary | ICD-10-CM | POA: Diagnosis not present

## 2024-06-17 LAB — RAD ONC ARIA SESSION SUMMARY
Course Elapsed Days: 37
Plan Fractions Treated to Date: 27
Plan Prescribed Dose Per Fraction: 2 Gy
Plan Total Fractions Prescribed: 30
Plan Total Prescribed Dose: 60 Gy
Reference Point Dosage Given to Date: 54 Gy
Reference Point Session Dosage Given: 2 Gy
Session Number: 27

## 2024-06-18 ENCOUNTER — Other Ambulatory Visit: Payer: Self-pay

## 2024-06-18 ENCOUNTER — Ambulatory Visit
Admission: RE | Admit: 2024-06-18 | Discharge: 2024-06-18 | Disposition: A | Discharge status: Home / Self Care | Source: Ambulatory Visit | Attending: Radiation Oncology | Admitting: Radiation Oncology

## 2024-06-18 DIAGNOSIS — Z510 Encounter for antineoplastic radiation therapy: Secondary | ICD-10-CM | POA: Diagnosis not present

## 2024-06-18 DIAGNOSIS — C3431 Malignant neoplasm of lower lobe, right bronchus or lung: Secondary | ICD-10-CM | POA: Diagnosis not present

## 2024-06-18 LAB — RAD ONC ARIA SESSION SUMMARY
Course Elapsed Days: 38
Plan Fractions Treated to Date: 28
Plan Prescribed Dose Per Fraction: 2 Gy
Plan Total Fractions Prescribed: 30
Plan Total Prescribed Dose: 60 Gy
Reference Point Dosage Given to Date: 56 Gy
Reference Point Session Dosage Given: 2 Gy
Session Number: 28

## 2024-06-19 ENCOUNTER — Other Ambulatory Visit: Payer: Self-pay

## 2024-06-20 ENCOUNTER — Other Ambulatory Visit: Payer: Self-pay | Admitting: Cardiology

## 2024-06-21 ENCOUNTER — Other Ambulatory Visit: Payer: Self-pay

## 2024-06-21 ENCOUNTER — Ambulatory Visit
Admission: RE | Admit: 2024-06-21 | Discharge: 2024-06-21 | Disposition: A | Discharge status: Home / Self Care | Source: Ambulatory Visit | Attending: Radiation Oncology | Admitting: Radiation Oncology

## 2024-06-21 ENCOUNTER — Inpatient Hospital Stay

## 2024-06-21 ENCOUNTER — Inpatient Hospital Stay (HOSPITAL_BASED_OUTPATIENT_CLINIC_OR_DEPARTMENT_OTHER): Admitting: Internal Medicine

## 2024-06-21 VITALS — HR 95

## 2024-06-21 VITALS — BP 115/78 | HR 108 | Temp 97.6°F | Resp 18 | Ht 68.0 in | Wt 209.0 lb

## 2024-06-21 DIAGNOSIS — C3431 Malignant neoplasm of lower lobe, right bronchus or lung: Secondary | ICD-10-CM | POA: Diagnosis not present

## 2024-06-21 DIAGNOSIS — C3490 Malignant neoplasm of unspecified part of unspecified bronchus or lung: Secondary | ICD-10-CM

## 2024-06-21 DIAGNOSIS — Z510 Encounter for antineoplastic radiation therapy: Secondary | ICD-10-CM | POA: Diagnosis not present

## 2024-06-21 LAB — CBC WITH DIFFERENTIAL (CANCER CENTER ONLY)
Abs Immature Granulocytes: 0.04 K/uL (ref 0.00–0.07)
Basophils Absolute: 0 K/uL (ref 0.0–0.1)
Basophils Relative: 1 %
Eosinophils Absolute: 0 K/uL (ref 0.0–0.5)
Eosinophils Relative: 0 %
HCT: 28.9 % — ABNORMAL LOW (ref 39.0–52.0)
Hemoglobin: 10.1 g/dL — ABNORMAL LOW (ref 13.0–17.0)
Immature Granulocytes: 1 %
Lymphocytes Relative: 8 %
Lymphs Abs: 0.3 K/uL — ABNORMAL LOW (ref 0.7–4.0)
MCH: 34.7 pg — ABNORMAL HIGH (ref 26.0–34.0)
MCHC: 34.9 g/dL (ref 30.0–36.0)
MCV: 99.3 fL (ref 80.0–100.0)
Monocytes Absolute: 0.3 K/uL (ref 0.1–1.0)
Monocytes Relative: 7 %
Neutro Abs: 3.5 K/uL (ref 1.7–7.7)
Neutrophils Relative %: 83 %
Platelet Count: 112 K/uL — ABNORMAL LOW (ref 150–400)
RBC: 2.91 MIL/uL — ABNORMAL LOW (ref 4.22–5.81)
RDW: 17.9 % — ABNORMAL HIGH (ref 11.5–15.5)
WBC Count: 4.2 K/uL (ref 4.0–10.5)
nRBC: 1.2 % — ABNORMAL HIGH (ref 0.0–0.2)

## 2024-06-21 LAB — RAD ONC ARIA SESSION SUMMARY
Course Elapsed Days: 41
Plan Fractions Treated to Date: 29
Plan Prescribed Dose Per Fraction: 2 Gy
Plan Total Fractions Prescribed: 30
Plan Total Prescribed Dose: 60 Gy
Reference Point Dosage Given to Date: 58 Gy
Reference Point Session Dosage Given: 2 Gy
Session Number: 29

## 2024-06-21 LAB — CMP (CANCER CENTER ONLY)
ALT: 19 U/L (ref 0–44)
AST: 24 U/L (ref 15–41)
Albumin: 4 g/dL (ref 3.5–5.0)
Alkaline Phosphatase: 90 U/L (ref 38–126)
Anion gap: 8 (ref 5–15)
BUN: 23 mg/dL (ref 8–23)
CO2: 21 mmol/L — ABNORMAL LOW (ref 22–32)
Calcium: 8.7 mg/dL — ABNORMAL LOW (ref 8.9–10.3)
Chloride: 109 mmol/L (ref 98–111)
Creatinine: 1.06 mg/dL (ref 0.61–1.24)
GFR, Estimated: >60 mL/min (ref >60)
Glucose, Bld: 178 mg/dL — ABNORMAL HIGH (ref 70–99)
Potassium: 4.3 mmol/L (ref 3.5–5.1)
Sodium: 138 mmol/L (ref 135–145)
Total Bilirubin: 0.6 mg/dL (ref 0.0–1.2)
Total Protein: 6.5 g/dL (ref 6.5–8.1)

## 2024-06-21 LAB — SAMPLE TO BLOOD BANK

## 2024-06-21 MED ORDER — SODIUM CHLORIDE 0.9 % IV SOLN
207.2000 mg | Freq: Once | INTRAVENOUS | Status: AC
  Administered 2024-06-21 (×2): 210 mg via INTRAVENOUS
  Filled 2024-06-21: qty 21

## 2024-06-21 MED ORDER — SODIUM CHLORIDE 0.9% FLUSH
10.0000 mL | INTRAVENOUS | Status: DC | PRN
Start: 2024-06-21 — End: 2024-06-21

## 2024-06-21 MED ORDER — PALONOSETRON HCL INJECTION 0.25 MG/5ML
0.2500 mg | Freq: Once | INTRAVENOUS | Status: AC
  Administered 2024-06-21 (×2): 0.25 mg via INTRAVENOUS
  Filled 2024-06-21: qty 5

## 2024-06-21 MED ORDER — SODIUM CHLORIDE 0.9 % IV SOLN
45.0000 mg/m2 | Freq: Once | INTRAVENOUS | Status: AC
  Administered 2024-06-21 (×2): 96 mg via INTRAVENOUS
  Filled 2024-06-21: qty 16

## 2024-06-21 MED ORDER — SODIUM CHLORIDE 0.9 % IV SOLN: INTRAVENOUS | Status: DC

## 2024-06-21 MED ORDER — FAMOTIDINE IN NACL 20-0.9 MG/50ML-% IV SOLN
20.0000 mg | Freq: Once | INTRAVENOUS | Status: AC
  Administered 2024-06-21 (×2): 20 mg via INTRAVENOUS
  Filled 2024-06-21: qty 50

## 2024-06-21 MED ORDER — DEXAMETHASONE SODIUM PHOSPHATE 10 MG/ML IJ SOLN
10.0000 mg | Freq: Once | INTRAMUSCULAR | Status: AC
  Administered 2024-06-21 (×2): 10 mg via INTRAVENOUS
  Filled 2024-06-21: qty 1

## 2024-06-21 MED ORDER — DIPHENHYDRAMINE HCL 50 MG/ML IJ SOLN
50.0000 mg | Freq: Once | INTRAMUSCULAR | Status: AC
  Administered 2024-06-21 (×2): 50 mg via INTRAVENOUS
  Filled 2024-06-21: qty 1

## 2024-06-21 NOTE — Patient Instructions (Signed)
 [facility] - A DEPT OF [facility]  Discharge Instructions: Thank you for choosing [facility] to provide your oncology and hematology care.   If you have a lab appointment with the Cancer Center, please go directly to the Cancer Center and check in at the registration area.   Wear comfortable clothing and clothing appropriate for easy access to any Portacath or PICC line.   We strive to give you quality time with your provider. You may need to reschedule your appointment if you arrive late (15 or more minutes).  Arriving late affects you and other patients whose appointments are after yours.  Also, if you miss three or more appointments without notifying the office, you may be dismissed from the clinic at the provider's discretion.      For prescription refill requests, have your pharmacy contact our office and allow 72 hours for refills to be completed.    Today you received the following chemotherapy and/or immunotherapy agents: Taxol /Carbo.   To help prevent nausea and vomiting after your treatment, we encourage you to take your nausea medication as directed.  BELOW ARE SYMPTOMS THAT SHOULD BE REPORTED IMMEDIATELY: *FEVER GREATER THAN 100.4 F (38 C) OR HIGHER *CHILLS OR SWEATING *NAUSEA AND VOMITING THAT IS NOT CONTROLLED WITH YOUR NAUSEA MEDICATION *UNUSUAL SHORTNESS OF BREATH *UNUSUAL BRUISING OR BLEEDING *URINARY PROBLEMS (pain or burning when urinating, or frequent urination) *BOWEL PROBLEMS (unusual diarrhea, constipation, pain near the anus) TENDERNESS IN MOUTH AND THROAT WITH OR WITHOUT PRESENCE OF ULCERS (sore throat, sores in mouth, or a toothache) UNUSUAL RASH, SWELLING OR PAIN  UNUSUAL VAGINAL DISCHARGE OR ITCHING   Items with * indicate a potential emergency and should be followed up as soon as possible or go to the Emergency Department if any problems should occur.  Please show the CHEMOTHERAPY ALERT CARD or IMMUNOTHERAPY  ALERT CARD at check-in to the Emergency Department and triage nurse.  Should you have questions after your visit or need to cancel or reschedule your appointment, please contact [facility] - A DEPT OF [facility]  Dept: [phone]  and follow the prompts.  Office hours are 8:00 a.m. to 4:30 p.m. Monday - Friday. Please note that voicemails left after 4:00 p.m. may not be returned until the following business day.  We are closed weekends and major holidays. You have access to a nurse at all times for urgent questions. Please call the main number to the clinic Dept: [phone] and follow the prompts.   For any non-urgent questions, you may also contact your provider using MyChart. We now offer e-Visits for anyone 44 and older to request care online for non-urgent symptoms. For details visit [email or web address].   Also download the MyChart app! Go to the app store, search MyChart, open the app, select [place], and log in with your MyChart username and password.

## 2024-06-21 NOTE — Progress Notes (Signed)
 [facility] [phone]   [phone]  OFFICE PROGRESS NOTE  Koirala, [name], MD [address] [place] [address]  DIAGNOSIS: Stage IIIA (T4, N1, M0) non-small cell lung cancer, adenocarcinoma presented with right lower lobe lung mass in addition to suspicious right upper lobe lesion and right hilar lymphadenopathy diagnosed in April 2025.   Biomarker Findings HRD signature - Cannot Be Determined Microsatellite status - Cannot Be Determined ? Tumor Mutational Burden - Cannot Be Determined Genomic Findings For a complete list of the genes assayed, please refer to the Appendix. AKT2 amplification CDKN2A p16INK4a R80* and p14ARF P94L KRAS G12D ASXL1 W796fs*22 GNAS R201H U2AF1 S34F 7 Disease relevant genes with no reportable alterations: ALK, BRAF, EGFR, ERBB2, MET, RET, ROS1  PDL1 TPS: 0%  PRIOR THERAPY: Neoadjuvant systemic chemotherapy with carboplatin  for AUC of 5, Alimta  500 Mg/M2 and Keytruda  200 Mg IV every 3 weeks.  Status post 1 cycle.  This was discontinued secondary to intolerance.  CURRENT THERAPY: A course of concurrent chemoradiation with weekly carboplatin  for AUC of 2 and paclitaxel  45 Mg/M2.  First dose dose 32,025.  Status post 6 cycles.  INTERVAL HISTORY: Peter [name] 78 y.o. male returns to the clinic today for follow-up visit.Discussed the use of AI scribe software for clinical note transcription with the patient, who gave verbal consent to proceed.  History of Present Illness Peter [name] is a 78 year old male with stage III non-small cell lung cancer who presents for evaluation before starting cycle seven of concurrent chemoradiation.  He was diagnosed with stage III non-small cell lung cancer, adenocarcinoma, in April 2025. Initial treatment with neoadjuvant systemic chemoimmunotherapy using carboplatin , Alimta , and Keytruda  was discontinued after one cycle due to intolerance. He is currently  undergoing concurrent chemoradiation with weekly carboplatin  and paclitaxel .  He has developed a rash in the inguinal area that appeared a couple of weeks ago and has been spreading upwards. He has not tried any treatments yet but mentions washing the area daily.  He denies any significant weight loss, stating his weight fluctuates between 206 and 209 pounds. He maintains an appetite but notes that food 'tastes awful.' No swallowing issues are reported.  He mentions sporadic spots on his leg, which he assumes are related to his treatment.    MEDICAL HISTORY: Past Medical History:  Diagnosis Date   Arthritis    At risk for sleep apnea    STOP-BANG= 5       SENT TO PCP 07-12-2015   CAD (coronary artery disease)    a. s/p PCI with DES x 3 to LAD in 2006 with bifurcation lesion and Plavix  indefinitely was recommended.   Chronic combined systolic and diastolic CHF (congestive heart failure) (HCC)    a. Prior EF normal but EF dropped to 30-35% in 07/2016 in setting of AFIB.   CKD (chronic kidney disease), stage III (HCC)    Depression    Dyslipidemia    Dyspnea    tired quick d/t Afib   Dyspnea on exertion    Dysrhythmia    A.FIB   GERD (gastroesophageal reflux disease)    History of GI bleed    History of kidney stones    History of MI (myocardial infarction)    11-05-2005--  periprocedural MI  (cardiac cath) per dr obie note   Hypertension    Hypertriglyceridemia    Iron deficiency anemia    Myocardial infarction (HCC)  Persistent atrial fibrillation (HCC)    a. 07/2016, started on Eliquis  -> readmitted later that month for AF RVR/acute HF - initially unsuccessful DCCV following TEE, so loaded with amio with repeat successful DCCV 07/2816.   Right ureteral stone    S/P drug eluting coronary stent placement    x3  to LAD  2006   Sleep apnea    wears BIPAP, 14 up to 24    ALLERGIES:  is allergic to prednisone  and spironolactone .  MEDICATIONS:  Current Outpatient  Medications  Medication Sig Dispense Refill   albuterol  (VENTOLIN  HFA) 108 (90 Base) MCG/ACT inhaler Inhale 2 puffs into the lungs every 6 (six) hours as needed for wheezing or shortness of breath (intractable cough). (Patient not taking: Reported on 05/10/2024) 8 g 2   allopurinol  (ZYLOPRIM ) 100 MG tablet Take 100 mg by mouth at bedtime.     allopurinol  (ZYLOPRIM ) 300 MG tablet Take 300 mg by mouth in the morning.     atorvastatin  (LIPITOR) 40 MG tablet TAKE 1 TABLET EVERY DAY (NEED MD APPOINTMENT) 90 tablet 0   benzonatate  (TESSALON  PERLES) 100 MG capsule Take 2 capsules (200 mg total) by mouth 2 (two) times daily as needed for cough. (Patient not taking: Reported on 05/10/2024) 30 capsule 1   budeson-glycopyrrolate-formoterol (BREZTRI  AEROSPHERE) 160-9-4.8 MCG/ACT AERO Inhale 2 puffs into the lungs in the morning and at bedtime. (Patient not taking: Reported on 05/10/2024)     carvedilol  (COREG ) 25 MG tablet TAKE 1 TABLET TWICE DAILY WITH MEALS 180 tablet 0   chlorproMAZINE  (THORAZINE ) 10 MG tablet Take 1 tablet (10 mg total) by mouth 3 (three) times daily as needed. 15 tablet 0   Cyanocobalamin  (VITAMIN B-12 PO) Take 1 tablet by mouth daily.     diphenhydramine -acetaminophen  (TYLENOL  PM) 25-500 MG TABS tablet Take 1 tablet by mouth at bedtime.     ferrous sulfate  325 (65 FE) MG tablet Take 1 tablet (325 mg total) by mouth every morning. 90 tablet 2   furosemide  (LASIX ) 40 MG tablet TAKE 1 TABLET EVERY DAY 90 tablet 0   hydrocortisone  1 % lotion Apply 1 Application topically 2 (two) times daily. 118 mL 0   lisinopril  (ZESTRIL ) 40 MG tablet TAKE 1 TABLET EVERY DAY (Patient not taking: Reported on 05/24/2024) 90 tablet 3   nitroGLYCERIN  (NITROSTAT ) 0.4 MG SL tablet Place 1 tablet (0.4 mg total) under the tongue every 5 (five) minutes as needed for chest pain. X 3 doses 25 tablet 5   pantoprazole  (PROTONIX ) 40 MG tablet Take 40 mg by mouth daily.     polyvinyl alcohol  (LIQUIFILM TEARS) 1.4 % ophthalmic  solution Place 1 drop into both eyes daily as needed for dry eyes.     rivaroxaban  (XARELTO ) 20 MG TABS tablet Take 1 tablet (20 mg total) by mouth at bedtime. Okay to restart this medication on 02/10/24     sertraline  (ZOLOFT ) 25 MG tablet Take 25 mg by mouth daily.     triamcinolone  cream (KENALOG ) 0.1 % Apply 1 Application topically 2 (two) times daily. 453.6 g 0   No current facility-administered medications for this visit.    SURGICAL HISTORY:  Past Surgical History:  Procedure Laterality Date   BRONCHIAL BIOPSY  02/09/2024   Procedure: BRONCHOSCOPY, WITH BIOPSY;  Surgeon: Shelah Lamar RAMAN, MD;  Location: [facility] ENDOSCOPY;  Service: Pulmonary;;   BRONCHIAL BRUSHINGS  02/09/2024   Procedure: BRONCHOSCOPY, WITH BRUSH BIOPSY;  Surgeon: Shelah Lamar RAMAN, MD;  Location: [facility] ENDOSCOPY;  Service: Pulmonary;;  BRONCHIAL NEEDLE ASPIRATION BIOPSY  02/09/2024   Procedure: BRONCHOSCOPY, WITH NEEDLE ASPIRATION BIOPSY;  Surgeon: Shelah Lamar RAMAN, MD;  Location: [facility] ENDOSCOPY;  Service: Pulmonary;;   BRONCHIAL WASHINGS  02/09/2024   Procedure: IRRIGATION, BRONCHUS;  Surgeon: Shelah Lamar RAMAN, MD;  Location: [facility] ENDOSCOPY;  Service: Pulmonary;;   CARDIOVASCULAR STRESS TEST  01-06-2013  dr micky   normal perfusion study/  no ischemia or infarct/  normal LV function and wall motion , ef 57%   CARDIOVERSION N/A 08/05/2016   Procedure: CARDIOVERSION;  Surgeon: Jerel Balding, MD;  Location: [facility] ENDOSCOPY;  Service: Cardiovascular;  Laterality: N/A;   CARDIOVERSION N/A 08/08/2016   Procedure: CARDIOVERSION;  Surgeon: Oneil JAYSON Parchment, MD;  Location: [facility] ENDOSCOPY;  Service: Cardiovascular;  Laterality: N/A;   CORONARY ANGIOPLASTY  11-13-2005 dr obie   Successful touch up post-dilatation of the 3 tandem overlying Cypher stents in proximal to mid LAD (based on IVUS 0% stenosis and patent diagonal branch)/  Cutting Balloon Angioplasty of Ramus branch   CORONARY ANGIOPLASTY WITH STENT PLACEMENT  11-05-2005   dr wolm brodie    PCI and DES x3 to birfurcation lesion LAD and diagonal branch of LAD/   20% dLM,  30-40% RCA, ef 60%   CYSTOSCOPY WITH RETROGRADE PYELOGRAM, URETEROSCOPY AND STENT PLACEMENT Right 07/14/2015   Procedure: CYSTOSCOPY WITH RETROGRADE PYELOGRAM, URETEROSCOPY AND STENT PLACEMENT;  Surgeon: Ricardo Likens, MD;  Location: [facility];  Service: Urology;  Laterality: Right;   ELECTROPHYSIOLOGIC STUDY N/A 10/24/2016   Procedure: Atrial Fibrillation Ablation;  Surgeon: Will Gladis Norton, MD;  Location: [facility] INVASIVE CV LAB;  Service: Cardiovascular;  Laterality: N/A;   EXTRACORPOREAL SHOCK WAVE LITHOTRIPSY  yrs ago   HEMORRHOID SURGERY N/A 10/24/2017   Procedure: HEMORRHOIDECTOMY;  Surgeon: Teresa Lonni HERO, MD;  Location: [facility] OR;  Service: General;  Laterality: N/A;   HERNIA REPAIR Bilateral 2000   Inguinal Hernia   IR GENERIC HISTORICAL  10/23/2016   IR FLUORO GUIDE CV LINE RIGHT 10/23/2016 Ozell Specking, MD [facility]   IR GENERIC HISTORICAL  10/23/2016   IR US  GUIDE VASC ACCESS RIGHT 10/23/2016 Ozell Specking, MD [facility]   LEFT URETEROSCOPIC STONE EXTRACTION  04/10/2006   STONE EXTRACTION WITH BASKET Right 07/14/2015   Procedure: STONE EXTRACTION WITH BASKET;  Surgeon: Ricardo Likens, MD;  Location: [facility];  Service: Urology;  Laterality: Right;   TEE WITHOUT CARDIOVERSION N/A 08/05/2016   Procedure: TRANSESOPHAGEAL ECHOCARDIOGRAM (TEE);  Surgeon: Jerel Balding, MD;  Location: [facility] ENDOSCOPY;  Service: Cardiovascular;  Laterality: N/A;   TOTAL HIP ARTHROPLASTY Right 07/16/2022   Procedure: RIGHT TOTAL HIP ARTHROPLASTY ANTERIOR APPROACH;  Surgeon: Jerri Kay HERO, MD;  Location: [facility] OR;  Service: Orthopedics;  Laterality: Right;  3-C   TRANSTHORACIC ECHOCARDIOGRAM  02/04/2012   grade I diastolic dysfunction/  ef 55-60%/  trivial MR and TR/  mild LAE   VIDEO BRONCHOSCOPY WITH ENDOBRONCHIAL NAVIGATION Right 02/09/2024   Procedure: VIDEO BRONCHOSCOPY WITH  ENDOBRONCHIAL NAVIGATION;  Surgeon: Shelah Lamar RAMAN, MD;  Location: [facility] ENDOSCOPY;  Service: Pulmonary;  Laterality: Right;  WITH FLOURO    REVIEW OF SYSTEMS:  A comprehensive review of systems was negative except for: Constitutional: positive for fatigue Integument/breast: positive for rash   PHYSICAL EXAMINATION: General appearance: alert, cooperative, fatigued, and no distress Head: Normocephalic, without obvious abnormality, atraumatic Neck: no adenopathy, no JVD, supple, symmetrical, trachea midline, and thyroid  not enlarged, symmetric, no tenderness/mass/nodules Lymph nodes: Cervical, supraclavicular, and axillary nodes normal. Resp: clear to auscultation  bilaterally Back: symmetric, no curvature. ROM normal. No CVA tenderness. Cardio: regular rate and rhythm, S1, S2 normal, no murmur, click, rub or gallop GI: soft, non-tender; bowel sounds normal; no masses,  no organomegaly Extremities: extremities normal, atraumatic, no cyanosis or edema  ECOG PERFORMANCE STATUS: 1 - Symptomatic but completely ambulatory  Blood pressure 115/78, pulse (!) 108, temperature 97.6 F (36.4 C), temperature source Temporal, resp. rate 18, height 5' 8 (1.727 m), weight 209 lb (94.8 kg), SpO2 97%.  LABORATORY DATA: Lab Results  Component Value Date   WBC 4.2 06/21/2024   HGB 10.1 (L) 06/21/2024   HCT 28.9 (L) 06/21/2024   MCV 99.3 06/21/2024   PLT 112 (L) 06/21/2024      Chemistry      Component Value Date/Time   NA 139 06/14/2024 0816   K 4.4 06/14/2024 0816   CL 109 06/14/2024 0816   CO2 23 06/14/2024 0816   BUN 20 06/14/2024 0816   CREATININE 1.02 06/14/2024 0816   CREATININE 1.72 (H) 10/16/2016 1115      Component Value Date/Time   CALCIUM  8.6 (L) 06/14/2024 0816   ALKPHOS 79 06/14/2024 0816   AST 23 06/14/2024 0816   ALT 19 06/14/2024 0816   BILITOT 0.6 06/14/2024 0816       RADIOGRAPHIC STUDIES: No results found.   ASSESSMENT AND PLAN: This is a very pleasant 78 years old  white male with Stage IIIA (T4, N1, M0) non-small cell lung cancer, adenocarcinoma presented with right lower lobe lung mass in addition to suspicious right upper lobe lesion and right hilar lymphadenopathy diagnosed in April 2025.  Molecular studies showed no actionable mutations and PD-L1 expression was negative. The patient is currently undergoing neoadjuvant chemoimmunotherapy with carboplatin  AUC of 5, Alimta  500 Mg/M2 and Keytruda  200 Mg IV every 3 weeks status post 1 cycle.  He tolerated the first cycle of his treatment well except for mild fatigue and mild skin rash on the face. The patient has intolerance to the neoadjuvant chemoimmunotherapy and this was discontinued. I had a lengthy discussion with him about his current condition and treatment options.  I discussed with him consideration of treatment with a course of concurrent chemoradiation with weekly carboplatin  for AUC of 2 and paclitaxel  45 Mg/M2.  First dose expected May 10, 2024.  Status post 6 cycles.  Assessment and Plan Assessment & Plan Stage III non-small cell lung cancer, adenocarcinoma Stage III non-small cell lung cancer, adenocarcinoma diagnosed in April 2025. Initially treated with neoadjuvant systemic chemoimmunotherapy with carboplatin , Alimta , and Keytruda , but treatment was discontinued after one cycle due to intolerance. Currently undergoing concurrent chemoradiation with weekly carboplatin  and paclitaxel . Blood counts are good, and he is set to receive the last cycle of treatment this week. No significant weight loss or night sweats reported. Appetite is present, but food tastes awful. No swallowing issues. - Administer last cycle of concurrent chemoradiation with carboplatin  and paclitaxel  - Order scan in four weeks to evaluate treatment response - Consider immunotherapy based on scan results  Rash in inguinal and leg area Rash in the inguinal area and leg, possibly due to friction and humidity. Rash appeared a  couple of weeks ago and has been spreading. No previous treatment attempted. Differential diagnosis includes friction rash. - Apply hydrocortisone  cream to the affected area - Use powders to keep the area dry - Consult with primary care physician if rash persists The patient was advised to call immediately if she has any other concerning symptoms in the  interval. The patient voices understanding of current disease status and treatment options and is in agreement with the current care plan.  All questions were answered. The patient knows to call the clinic with any problems, questions or concerns. We can certainly see the patient much sooner if necessary.  The total time spent in the appointment was 30 minutes.  Disclaimer: This note was dictated with voice recognition software. Similar sounding words can inadvertently be transcribed and may not be corrected upon review.

## 2024-06-22 ENCOUNTER — Ambulatory Visit
Admission: RE | Admit: 2024-06-22 | Discharge: 2024-06-22 | Disposition: A | Discharge status: Home / Self Care | Source: Ambulatory Visit | Attending: Radiation Oncology | Admitting: Radiation Oncology

## 2024-06-22 ENCOUNTER — Other Ambulatory Visit: Payer: Self-pay

## 2024-06-22 ENCOUNTER — Ambulatory Visit

## 2024-06-22 DIAGNOSIS — C3431 Malignant neoplasm of lower lobe, right bronchus or lung: Secondary | ICD-10-CM | POA: Diagnosis not present

## 2024-06-22 DIAGNOSIS — I480 Paroxysmal atrial fibrillation: Secondary | ICD-10-CM

## 2024-06-22 DIAGNOSIS — Z510 Encounter for antineoplastic radiation therapy: Secondary | ICD-10-CM | POA: Diagnosis not present

## 2024-06-22 LAB — RAD ONC ARIA SESSION SUMMARY
Course Elapsed Days: 42
Plan Fractions Treated to Date: 30
Plan Prescribed Dose Per Fraction: 2 Gy
Plan Total Fractions Prescribed: 30
Plan Total Prescribed Dose: 60 Gy
Reference Point Dosage Given to Date: 60 Gy
Reference Point Session Dosage Given: 2 Gy
Session Number: 30

## 2024-06-22 MED ORDER — RIVAROXABAN 20 MG PO TABS: 20.0000 mg | ORAL_TABLET | Freq: Every day | ORAL | 1 refills | Status: DC

## 2024-06-22 NOTE — Telephone Encounter (Signed)
 Prescription refill request for Xarelto  received.  Indication:AFIB Last office visit:2/25 Weight:94.8  kg Age:78 Scr:1.06  8/25 CrCl:75.77  ml/min  Prescription refilled

## 2024-06-23 ENCOUNTER — Other Ambulatory Visit: Payer: Self-pay

## 2024-06-23 ENCOUNTER — Ambulatory Visit
Admission: RE | Admit: 2024-06-23 | Discharge: 2024-06-23 | Disposition: A | Discharge status: Home / Self Care | Source: Ambulatory Visit | Attending: Radiation Oncology | Admitting: Radiation Oncology

## 2024-06-23 ENCOUNTER — Ambulatory Visit

## 2024-06-23 DIAGNOSIS — Z510 Encounter for antineoplastic radiation therapy: Secondary | ICD-10-CM | POA: Diagnosis not present

## 2024-06-23 DIAGNOSIS — C3431 Malignant neoplasm of lower lobe, right bronchus or lung: Secondary | ICD-10-CM | POA: Diagnosis not present

## 2024-06-23 LAB — RAD ONC ARIA SESSION SUMMARY
Course Elapsed Days: 43
Plan Fractions Treated to Date: 1
Plan Prescribed Dose Per Fraction: 2 Gy
Plan Total Fractions Prescribed: 3
Plan Total Prescribed Dose: 6 Gy
Reference Point Dosage Given to Date: 2 Gy
Reference Point Session Dosage Given: 2 Gy
Session Number: 31

## 2024-06-23 LAB — GLUCOSE, CAPILLARY: Glucose-Capillary: 206 mg/dL — ABNORMAL HIGH (ref 70–99)

## 2024-06-24 ENCOUNTER — Ambulatory Visit

## 2024-06-24 ENCOUNTER — Other Ambulatory Visit: Payer: Self-pay

## 2024-06-24 ENCOUNTER — Ambulatory Visit
Admission: RE | Admit: 2024-06-24 | Discharge: 2024-06-24 | Disposition: A | Discharge status: Home / Self Care | Source: Ambulatory Visit | Attending: Radiation Oncology | Admitting: Radiation Oncology

## 2024-06-24 DIAGNOSIS — C3431 Malignant neoplasm of lower lobe, right bronchus or lung: Secondary | ICD-10-CM | POA: Diagnosis not present

## 2024-06-24 LAB — RAD ONC ARIA SESSION SUMMARY
Course Elapsed Days: 44
Plan Fractions Treated to Date: 2
Plan Prescribed Dose Per Fraction: 2 Gy
Plan Total Fractions Prescribed: 3
Plan Total Prescribed Dose: 6 Gy
Reference Point Dosage Given to Date: 4 Gy
Reference Point Session Dosage Given: 2 Gy
Session Number: 32

## 2024-06-25 ENCOUNTER — Other Ambulatory Visit: Payer: Self-pay

## 2024-06-25 ENCOUNTER — Emergency Department (HOSPITAL_COMMUNITY)

## 2024-06-25 ENCOUNTER — Ambulatory Visit
Admission: RE | Admit: 2024-06-25 | Discharge: 2024-06-25 | Disposition: A | Discharge status: Home / Self Care | Source: Ambulatory Visit | Attending: Radiation Oncology | Admitting: Radiation Oncology

## 2024-06-25 ENCOUNTER — Observation Stay (HOSPITAL_COMMUNITY)
Admission: EM | Admit: 2024-06-25 | Discharge: 2024-06-26 | Disposition: A | Discharge status: Home / Self Care | Attending: Internal Medicine | Admitting: Internal Medicine

## 2024-06-25 ENCOUNTER — Encounter (HOSPITAL_COMMUNITY): Payer: Self-pay | Admitting: Emergency Medicine

## 2024-06-25 DIAGNOSIS — K219 Gastro-esophageal reflux disease without esophagitis: Secondary | ICD-10-CM | POA: Insufficient documentation

## 2024-06-25 DIAGNOSIS — R000 Tachycardia, unspecified: Secondary | ICD-10-CM | POA: Diagnosis present

## 2024-06-25 DIAGNOSIS — I480 Paroxysmal atrial fibrillation: Secondary | ICD-10-CM | POA: Diagnosis not present

## 2024-06-25 DIAGNOSIS — E785 Hyperlipidemia, unspecified: Secondary | ICD-10-CM | POA: Insufficient documentation

## 2024-06-25 DIAGNOSIS — G4733 Obstructive sleep apnea (adult) (pediatric): Secondary | ICD-10-CM | POA: Insufficient documentation

## 2024-06-25 DIAGNOSIS — E8721 Acute metabolic acidosis: Secondary | ICD-10-CM | POA: Insufficient documentation

## 2024-06-25 DIAGNOSIS — M109 Gout, unspecified: Secondary | ICD-10-CM | POA: Insufficient documentation

## 2024-06-25 DIAGNOSIS — I700 Atherosclerosis of aorta: Secondary | ICD-10-CM | POA: Diagnosis not present

## 2024-06-25 DIAGNOSIS — K449 Diaphragmatic hernia without obstruction or gangrene: Secondary | ICD-10-CM | POA: Insufficient documentation

## 2024-06-25 DIAGNOSIS — N179 Acute kidney failure, unspecified: Secondary | ICD-10-CM

## 2024-06-25 DIAGNOSIS — I130 Hypertensive heart and chronic kidney disease with heart failure and stage 1 through stage 4 chronic kidney disease, or unspecified chronic kidney disease: Secondary | ICD-10-CM | POA: Insufficient documentation

## 2024-06-25 DIAGNOSIS — N1831 Chronic kidney disease, stage 3a: Secondary | ICD-10-CM | POA: Diagnosis not present

## 2024-06-25 DIAGNOSIS — I471 Supraventricular tachycardia, unspecified: Principal | ICD-10-CM | POA: Diagnosis present

## 2024-06-25 DIAGNOSIS — R918 Other nonspecific abnormal finding of lung field: Secondary | ICD-10-CM | POA: Diagnosis not present

## 2024-06-25 DIAGNOSIS — R0602 Shortness of breath: Secondary | ICD-10-CM | POA: Diagnosis not present

## 2024-06-25 DIAGNOSIS — C3490 Malignant neoplasm of unspecified part of unspecified bronchus or lung: Secondary | ICD-10-CM | POA: Insufficient documentation

## 2024-06-25 DIAGNOSIS — D649 Anemia, unspecified: Secondary | ICD-10-CM | POA: Diagnosis not present

## 2024-06-25 DIAGNOSIS — R0989 Other specified symptoms and signs involving the circulatory and respiratory systems: Secondary | ICD-10-CM | POA: Diagnosis not present

## 2024-06-25 DIAGNOSIS — Z85118 Personal history of other malignant neoplasm of bronchus and lung: Secondary | ICD-10-CM

## 2024-06-25 DIAGNOSIS — I5032 Chronic diastolic (congestive) heart failure: Secondary | ICD-10-CM | POA: Insufficient documentation

## 2024-06-25 DIAGNOSIS — R590 Localized enlarged lymph nodes: Secondary | ICD-10-CM | POA: Diagnosis not present

## 2024-06-25 DIAGNOSIS — K8020 Calculus of gallbladder without cholecystitis without obstruction: Secondary | ICD-10-CM | POA: Diagnosis not present

## 2024-06-25 DIAGNOSIS — I2510 Atherosclerotic heart disease of native coronary artery without angina pectoris: Secondary | ICD-10-CM | POA: Diagnosis not present

## 2024-06-25 LAB — BASIC METABOLIC PANEL WITH GFR
Anion gap: 12 (ref 5–15)
BUN: 38 mg/dL — ABNORMAL HIGH (ref 8–23)
CO2: 17 mmol/L — ABNORMAL LOW (ref 22–32)
Calcium: 9.2 mg/dL (ref 8.9–10.3)
Chloride: 109 mmol/L (ref 98–111)
Creatinine, Ser: 1.36 mg/dL — ABNORMAL HIGH (ref 0.61–1.24)
GFR, Estimated: 53 mL/min — ABNORMAL LOW (ref >60)
Glucose, Bld: 183 mg/dL — ABNORMAL HIGH (ref 70–99)
Potassium: 4.5 mmol/L (ref 3.5–5.1)
Sodium: 138 mmol/L (ref 135–145)

## 2024-06-25 LAB — HEPATIC FUNCTION PANEL
ALT: 24 U/L (ref 0–44)
AST: 36 U/L (ref 15–41)
Albumin: 3.7 g/dL (ref 3.5–5.0)
Alkaline Phosphatase: 80 U/L (ref 38–126)
Bilirubin, Direct: 0.1 mg/dL (ref 0.0–0.2)
Indirect Bilirubin: 0.9 mg/dL (ref 0.3–0.9)
Total Bilirubin: 1 mg/dL (ref 0.0–1.2)
Total Protein: 6.7 g/dL (ref 6.5–8.1)

## 2024-06-25 LAB — CBC
HCT: 31 % — ABNORMAL LOW (ref 39.0–52.0)
Hemoglobin: 10.3 g/dL — ABNORMAL LOW (ref 13.0–17.0)
MCH: 34.3 pg — ABNORMAL HIGH (ref 26.0–34.0)
MCHC: 33.2 g/dL (ref 30.0–36.0)
MCV: 103.3 fL — ABNORMAL HIGH (ref 80.0–100.0)
Platelets: 157 K/uL (ref 150–400)
RBC: 3 MIL/uL — ABNORMAL LOW (ref 4.22–5.81)
RDW: 17.6 % — ABNORMAL HIGH (ref 11.5–15.5)
WBC: 5.9 K/uL (ref 4.0–10.5)
nRBC: 0.5 % — ABNORMAL HIGH (ref 0.0–0.2)

## 2024-06-25 LAB — RAD ONC ARIA SESSION SUMMARY
Course Elapsed Days: 45
Plan Fractions Treated to Date: 3
Plan Prescribed Dose Per Fraction: 2 Gy
Plan Total Fractions Prescribed: 3
Plan Total Prescribed Dose: 6 Gy
Reference Point Dosage Given to Date: 6 Gy
Reference Point Session Dosage Given: 2 Gy
Session Number: 33

## 2024-06-25 LAB — TROPONIN I (HIGH SENSITIVITY): Troponin I (High Sensitivity): 28 ng/L — ABNORMAL HIGH (ref <18)

## 2024-06-25 MED ORDER — SODIUM CHLORIDE 0.9 % IV BOLUS
500.0000 mL | Freq: Once | INTRAVENOUS | Status: AC
  Administered 2024-06-25: 500 mL via INTRAVENOUS

## 2024-06-25 MED ORDER — ADENOSINE 6 MG/2ML IV SOLN
6.0000 mg | Freq: Once | INTRAVENOUS | Status: DC
  Filled 2024-06-25: qty 2

## 2024-06-25 MED ORDER — IOHEXOL 350 MG/ML SOLN
75.0000 mL | Freq: Once | INTRAVENOUS | Status: AC | PRN
  Administered 2024-06-25: 75 mL via INTRAVENOUS

## 2024-06-25 MED ORDER — CARVEDILOL 12.5 MG PO TABS: 25.0000 mg | ORAL_TABLET | Freq: Two times a day (BID) | ORAL | Status: DC

## 2024-06-25 MED ORDER — METOPROLOL TARTRATE 5 MG/5ML IV SOLN
5.0000 mg | Freq: Once | INTRAVENOUS | Status: AC
  Administered 2024-06-25: 5 mg via INTRAVENOUS
  Filled 2024-06-25: qty 5

## 2024-06-25 NOTE — ED Notes (Signed)
 Light blue and Dark green tube sent at the lab.

## 2024-06-25 NOTE — ED Provider Notes (Signed)
 [place] EMERGENCY DEPARTMENT AT [facility] Provider Note   CSN: [id number] Arrival date & time: 06/25/24  1956     Patient presents with: Shortness of Breath and Weakness   Peter [name] is a 78 y.o. male.   Patient has a history of lung cancer and coronary artery disease with atrial for flutter.  He has been getting radiation and chemo for his lung cancer.  Patient complains of shortness of breath and palpitations  The history is provided by the patient and medical records. No language interpreter was used.  Shortness of Breath Severity:  Moderate Onset quality:  Sudden Timing:  Constant Progression:  Worsening Chronicity:  New Context: activity   Associated symptoms: no abdominal pain, no chest pain, no cough, no headaches and no rash   Weakness Associated symptoms: shortness of breath   Associated symptoms: no abdominal pain, no chest pain, no cough, no diarrhea, no frequency, no headaches and no seizures        Prior to Admission medications   Medication Sig Start Date End Date Taking? Authorizing Provider  albuterol  (VENTOLIN  HFA) 108 (90 Base) MCG/ACT inhaler Inhale 2 puffs into the lungs every 6 (six) hours as needed for wheezing or shortness of breath (intractable cough). Patient not taking: Reported on 05/10/2024 12/25/23   Raenelle Coria, MD  allopurinol  (ZYLOPRIM ) 100 MG tablet Take 100 mg by mouth at bedtime. 11/19/21   [provider]  allopurinol  (ZYLOPRIM ) 300 MG tablet Take 300 mg by mouth in the morning.    [provider]  atorvastatin  (LIPITOR) 40 MG tablet TAKE 1 TABLET EVERY DAY (NEED MD APPOINTMENT, CALL [phone]) 06/21/24   Camnitz, Soyla Lunger, MD  benzonatate  (TESSALON  PERLES) 100 MG capsule Take 2 capsules (200 mg total) by mouth 2 (two) times daily as needed for cough. Patient not taking: Reported on 05/10/2024 03/08/24 03/08/25  Ruthell Lauraine FALCON, NP  budeson-glycopyrrolate-formoterol (BREZTRI  AEROSPHERE) 160-9-4.8  MCG/ACT AERO Inhale 2 puffs into the lungs in the morning and at bedtime. Patient not taking: Reported on 05/10/2024 02/16/24   Ruthell Lauraine FALCON, NP  carvedilol  (COREG ) 25 MG tablet TAKE 1 TABLET TWICE DAILY WITH MEALS 04/19/24   Camnitz, Soyla Lunger, MD  chlorproMAZINE  (THORAZINE ) 10 MG tablet Take 1 tablet (10 mg total) by mouth 3 (three) times daily as needed. 05/11/24   Heilingoetter, Cassandra L, PA-C  Cyanocobalamin  (VITAMIN B-12 PO) Take 1 tablet by mouth daily.    [provider]  diphenhydramine -acetaminophen  (TYLENOL  PM) 25-500 MG TABS tablet Take 1 tablet by mouth at bedtime.    [provider]  ferrous sulfate  325 (65 FE) MG tablet Take 1 tablet (325 mg total) by mouth every morning. 10/27/18   Camnitz, Soyla Lunger, MD  furosemide  (LASIX ) 40 MG tablet TAKE 1 TABLET EVERY DAY 04/19/24   Camnitz, Soyla Lunger, MD  hydrocortisone  1 % lotion Apply 1 Application topically 2 (two) times daily. 04/01/24   Sherrod Sherrod, MD  lisinopril  (ZESTRIL ) 40 MG tablet TAKE 1 TABLET EVERY DAY Patient not taking: Reported on 05/24/2024 03/29/24   Inocencio Soyla Lunger, MD  nitroGLYCERIN  (NITROSTAT ) 0.4 MG SL tablet Place 1 tablet (0.4 mg total) under the tongue every 5 (five) minutes as needed for chest pain. X 3 doses 04/09/23   Swinyer, Rosaline HERO, NP  pantoprazole  (PROTONIX ) 40 MG tablet Take 40 mg by mouth daily. 10/21/14   [provider]  polyvinyl alcohol  (LIQUIFILM TEARS) 1.4 % ophthalmic solution Place 1 drop into both eyes daily as  needed for dry eyes.    [provider]  rivaroxaban  (XARELTO ) 20 MG TABS tablet Take 1 tablet (20 mg total) by mouth at bedtime. Okay to restart this medication on 02/10/24 06/22/24   Jerilynn Lamarr HERO, NP  sertraline  (ZOLOFT ) 25 MG tablet Take 25 mg by mouth daily. 06/17/22   [provider]  triamcinolone  cream (KENALOG ) 0.1 % Apply 1 Application topically 2 (two) times daily. 04/14/24   Heilingoetter, Cassandra L, PA-C    Allergies:  Prednisone  and Spironolactone     Review of Systems  Constitutional:  Negative for appetite change and fatigue.  HENT:  Negative for congestion, ear discharge and sinus pressure.   Eyes:  Negative for discharge.  Respiratory:  Positive for shortness of breath. Negative for cough.   Cardiovascular:  Negative for chest pain.  Gastrointestinal:  Negative for abdominal pain and diarrhea.  Genitourinary:  Negative for frequency and hematuria.  Musculoskeletal:  Negative for back pain.  Skin:  Negative for rash.  Neurological:  Positive for weakness. Negative for seizures and headaches.  Psychiatric/Behavioral:  Negative for hallucinations.     Updated Vital Signs BP 109/82   Pulse (!) 106   Temp 98 F (36.7 C)   Resp 17   Ht 5' 8 (1.727 m)   Wt 94.8 kg   SpO2 98%   BMI 31.78 kg/m   Physical Exam Vitals and nursing note reviewed.  Constitutional:      Appearance: He is well-developed.  HENT:     Head: Normocephalic.     Nose: Nose normal.     Mouth/Throat:     Mouth: Mucous membranes are moist.  Eyes:     General: No scleral icterus.    Conjunctiva/sclera: Conjunctivae normal.  Neck:     Thyroid : No thyromegaly.  Cardiovascular:     Rate and Rhythm: Tachycardia present. Rhythm irregular.     Heart sounds: No murmur heard.    No friction rub. No gallop.  Pulmonary:     Breath sounds: No stridor. No wheezing or rales.  Chest:     Chest wall: No tenderness.  Abdominal:     General: There is no distension.     Tenderness: There is no abdominal tenderness. There is no rebound.  Musculoskeletal:        General: Normal range of motion.     Cervical back: Neck supple.  Lymphadenopathy:     Cervical: No cervical adenopathy.  Skin:    Findings: No erythema or rash.  Neurological:     Mental Status: He is alert and oriented to person, place, and time.     Motor: No abnormal muscle tone.     Coordination: Coordination normal.  Psychiatric:        Behavior: Behavior  normal.     (all labs ordered are listed, but only abnormal results are displayed) Labs Reviewed  BASIC METABOLIC PANEL WITH GFR - Abnormal; Notable for the following components:      Result Value   CO2 17 (*)    Glucose, Bld 183 (*)    BUN 38 (*)    Creatinine, Ser 1.36 (*)    GFR, Estimated 53 (*)    All other components within normal limits  CBC - Abnormal; Notable for the following components:   RBC 3.00 (*)    Hemoglobin 10.3 (*)    HCT 31.0 (*)    MCV 103.3 (*)    MCH 34.3 (*)    RDW 17.6 (*)  nRBC 0.5 (*)    All other components within normal limits  TROPONIN I (HIGH SENSITIVITY) - Abnormal; Notable for the following components:   Troponin I (High Sensitivity) 28 (*)    All other components within normal limits  HEPATIC FUNCTION PANEL  TROPONIN I (HIGH SENSITIVITY)    EKG: None  Radiology: CT Angio Chest PE W and/or Wo Contrast Result Date: 06/25/2024 CLINICAL DATA:  Weakness short of breath EXAM: CT ANGIOGRAPHY CHEST WITH CONTRAST TECHNIQUE: Multidetector CT imaging of the chest was performed using the standard protocol during bolus administration of intravenous contrast. Multiplanar CT image reconstructions and MIPs were obtained to evaluate the vascular anatomy. RADIATION DOSE REDUCTION: This exam was performed according to the departmental dose-optimization program which includes automated exposure control, adjustment of the mA and/or kV according to patient size and/or use of iterative reconstruction technique. CONTRAST:  75mL OMNIPAQUE  IOHEXOL  350 MG/ML SOLN COMPARISON:  CT 04/08/2024, PET CT 02/23/2024, prior exams dating back to 12/22/2023 FINDINGS: Cardiovascular: Satisfactory opacification of the pulmonary arteries to the segmental level. No evidence of pulmonary embolism. 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 Mild aortic atherosclerosis. No aneurysm. Multi vessel coronary vascular calcification. Cardiomegaly. No pericardial effusion  Mediastinum/Nodes: Patent trachea. No thyroid  mass. Enlarged right paratracheal node measures 13 mm compared with 14 mm previously. Mild right hilar soft tissue density as before. Several subcentimeter subcarinal nodes measuring up to 8 mm. Moderate to large hiatal hernia. Lungs/Pleura: Cavitary masslike density at the right apex measures about 5.2 by 3 cm fundus series 12, image 32, previously 5.8 x 3 cm, and extends to the right apical pleural surface. Cavitary masslike consolidation at the superior right lower lobe measures about 3.6 x 3 cm on series 12 image 52, previously about 3.4 x 3.9 cm Underlying chronic lung disease with subpleural reticulation and interstitial densities. Grossly similar right middle lobe nodularity, series 12, image 57. Medial right lower lobe nodule measures 11 x 9 mm on series 12, image 58, previously 17 x 9 mm. Small irregular nodule within the anterior right lower lobe measures 7 mm on series 12, image 65. No significant pleural effusion mild posterior pleural thickening. Upper Abdomen: No acute finding.  Gallstone. Musculoskeletal: No acute or suspicious osseous abnormality. Review of the MIP images confirms the above findings. IMPRESSION: 1. Negative for acute pulmonary embolus. 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 2. Cavitary and masslike densities corresponding to history of malignancy at the right apex and superior right lower lobe are grossly similar to prior exam. Grossly similar suspected satellite nodules in the right middle and lower lobes. 3. Underlying chronic interstitial lung disease without acute superimposed airspace disease or significant pleural effusion. 4. Similar mediastinal adenopathy and mild right hilar soft tissue density. 5. Moderate to large hiatal hernia. 6. Aortic atherosclerosis. Aortic Atherosclerosis (ICD10-I70.0). Electronically Signed   By: Luke Bun M.D.   On: 06/25/2024 23:45   DG CHEST PORT 1 VIEW Result Date:  06/25/2024 CLINICAL DATA:  Short of breath EXAM: PORTABLE CHEST 1 VIEW COMPARISON:  CT chest 04/08/2024 FINDINGS: Grossly normal cardiac silhouette. RIGHT suprahilar and RIGHT perihilar mass similar comparison CT. Low lung volumes on the RIGHT. LEFT lung relatively clear. IMPRESSION: RIGHT perihilar masses are similar to comparison CT. No clear acute findings Electronically Signed   By: Jackquline Boxer M.D.   On: 06/25/2024 21:27     Procedures   Medications Ordered in the ED  adenosine  (ADENOCARD ) 6 MG/2ML injection 6 mg (0 mg Intravenous Hold 06/25/24 2029)  carvedilol  (COREG ) tablet 25 mg (has no administration in time range)  sodium chloride  0.9 % bolus 500 mL (0 mLs Intravenous Stopped 06/25/24 2100)  metoprolol  tartrate (LOPRESSOR ) injection 5 mg (5 mg Intravenous Given 06/25/24 2038)  iohexol  (OMNIPAQUE ) 350 MG/ML injection 75 mL (75 mLs Intravenous Contrast Given 06/25/24 2223)  sodium chloride  0.9 % bolus 500 mL (500 mLs Intravenous New Bag/Given 06/25/24 2330)   Initially the patient was in an irregular rate of about 130 then he went into an SVT at 170.  He was in that for about 10 minutes and we were going to give adenosine  but he converted.  He then later went back in the fast rate of 170 for just a few minutes and converted again and then a third time he went into the rapid rate at 170 at which time we gave him some Lopressor  and his rate slowed down to about 110.  He was in a regular rate.  The patient used to take Coreg  twice a day but is only taking it once a day because his blood pressure has been low from the cancer treatment.  I spoke to cardiology and they are recommending giving his Coreg  twice a day for now and they will consult tomorrow.  He can have additional metoprolol  if necessary     CRITICAL CARE Performed by: Fairy Sermon Total critical care time: 45 minutes Critical care time was exclusive of separately billable procedures and treating other patients. Critical care  was necessary to treat or prevent imminent or life-threatening deterioration. Critical care was time spent personally by me on the following activities: development of treatment plan with patient and/or surrogate as well as nursing, discussions with consultants, evaluation of patient's response to treatment, examination of patient, obtaining history from patient or surrogate, ordering and performing treatments and interventions, ordering and review of laboratory studies, ordering and review of radiographic studies, pulse oximetry and re-evaluation of patient's condition.                                Medical Decision Making Amount and/or Complexity of Data Reviewed Labs: ordered. Radiology: ordered.  Risk Prescription drug management. Decision regarding hospitalization.   Atrial fibs flutter with rapid rate and episodes of SVT.  Patient will be admitted to medicine with cardiology consult     Final diagnoses:  None    ED Discharge Orders     None          Sermon Fairy, MD 06/27/24 1015

## 2024-06-25 NOTE — ED Triage Notes (Addendum)
 Patient c/o weakness and SOB x 3 days. Patient report worsening symptoms after radiation treatment today. Patient report he felt funny on his chest. Patient report nausea, denies vomiting. Patient denies fever. Hx Lung Ca. Afib , Patient taking xarelto .

## 2024-06-26 DIAGNOSIS — Z85118 Personal history of other malignant neoplasm of bronchus and lung: Secondary | ICD-10-CM

## 2024-06-26 DIAGNOSIS — I471 Supraventricular tachycardia, unspecified: Principal | ICD-10-CM | POA: Diagnosis present

## 2024-06-26 DIAGNOSIS — N179 Acute kidney failure, unspecified: Secondary | ICD-10-CM

## 2024-06-26 LAB — CBC
HCT: 27 % — ABNORMAL LOW (ref 39.0–52.0)
Hemoglobin: 9 g/dL — ABNORMAL LOW (ref 13.0–17.0)
MCH: 35 pg — ABNORMAL HIGH (ref 26.0–34.0)
MCHC: 33.3 g/dL (ref 30.0–36.0)
MCV: 105.1 fL — ABNORMAL HIGH (ref 80.0–100.0)
Platelets: 112 K/uL — ABNORMAL LOW (ref 150–400)
RBC: 2.57 MIL/uL — ABNORMAL LOW (ref 4.22–5.81)
RDW: 17.7 % — ABNORMAL HIGH (ref 11.5–15.5)
WBC: 3.2 K/uL — ABNORMAL LOW (ref 4.0–10.5)
nRBC: 0.9 % — ABNORMAL HIGH (ref 0.0–0.2)

## 2024-06-26 LAB — BASIC METABOLIC PANEL WITH GFR
Anion gap: 11 (ref 5–15)
BUN: 37 mg/dL — ABNORMAL HIGH (ref 8–23)
CO2: 19 mmol/L — ABNORMAL LOW (ref 22–32)
Calcium: 8.8 mg/dL — ABNORMAL LOW (ref 8.9–10.3)
Chloride: 107 mmol/L (ref 98–111)
Creatinine, Ser: 1.26 mg/dL — ABNORMAL HIGH (ref 0.61–1.24)
GFR, Estimated: 58 mL/min — ABNORMAL LOW (ref >60)
Glucose, Bld: 164 mg/dL — ABNORMAL HIGH (ref 70–99)
Potassium: 4.2 mmol/L (ref 3.5–5.1)
Sodium: 137 mmol/L (ref 135–145)

## 2024-06-26 LAB — TSH: TSH: 2.583 u[IU]/mL (ref 0.350–4.500)

## 2024-06-26 LAB — TROPONIN I (HIGH SENSITIVITY): Troponin I (High Sensitivity): 25 ng/L — ABNORMAL HIGH (ref <18)

## 2024-06-26 LAB — MAGNESIUM: Magnesium: 1.6 mg/dL — ABNORMAL LOW (ref 1.7–2.4)

## 2024-06-26 LAB — MRSA NEXT GEN BY PCR, NASAL: MRSA by PCR Next Gen: NOT DETECTED

## 2024-06-26 MED ORDER — ATORVASTATIN CALCIUM 40 MG PO TABS
40.0000 mg | ORAL_TABLET | Freq: Every day | ORAL | Status: DC
  Administered 2024-06-26: 40 mg via ORAL
  Filled 2024-06-26: qty 1

## 2024-06-26 MED ORDER — SODIUM CHLORIDE 0.9 % IV SOLN: INTRAVENOUS | Status: AC

## 2024-06-26 MED ORDER — ACETAMINOPHEN 325 MG PO TABS: 650.0000 mg | ORAL_TABLET | Freq: Four times a day (QID) | ORAL | Status: DC | PRN

## 2024-06-26 MED ORDER — ALLOPURINOL 300 MG PO TABS
300.0000 mg | ORAL_TABLET | Freq: Every day | ORAL | Status: DC
  Administered 2024-06-26: 300 mg via ORAL
  Filled 2024-06-26: qty 1

## 2024-06-26 MED ORDER — RIVAROXABAN 20 MG PO TABS: 20.0000 mg | ORAL_TABLET | Freq: Every day | ORAL | Status: DC

## 2024-06-26 MED ORDER — FUROSEMIDE 40 MG PO TABS: 20.0000 mg | ORAL_TABLET | Freq: Every day | ORAL | Status: DC

## 2024-06-26 MED ORDER — CARVEDILOL 25 MG PO TABS
25.0000 mg | ORAL_TABLET | Freq: Two times a day (BID) | ORAL | Status: DC
  Administered 2024-06-26 (×2): 25 mg via ORAL
  Filled 2024-06-26: qty 2
  Filled 2024-06-26: qty 1

## 2024-06-26 MED ORDER — ALLOPURINOL 100 MG PO TABS: 100.0000 mg | ORAL_TABLET | Freq: Every day | ORAL | Status: DC

## 2024-06-26 MED ORDER — ACETAMINOPHEN 650 MG RE SUPP: 650.0000 mg | Freq: Four times a day (QID) | RECTAL | Status: DC | PRN

## 2024-06-26 MED ORDER — MAGNESIUM SULFATE 2 GM/50ML IV SOLN
2.0000 g | Freq: Once | INTRAVENOUS | Status: AC
  Administered 2024-06-26: 2 g via INTRAVENOUS
  Filled 2024-06-26: qty 50

## 2024-06-26 MED ORDER — PANTOPRAZOLE SODIUM 40 MG PO TBEC
40.0000 mg | DELAYED_RELEASE_TABLET | Freq: Every day | ORAL | Status: DC
  Administered 2024-06-26: 40 mg via ORAL
  Filled 2024-06-26: qty 1

## 2024-06-26 NOTE — Progress Notes (Signed)
 Pt walked 255ft & remained 96-99% on RA. Tolerated well.

## 2024-06-26 NOTE — Progress Notes (Signed)
   06/26/24 0348  BiPAP/CPAP/SIPAP  $ Non-Invasive Home Ventilator  Initial  $ Face Mask Medium Yes  BiPAP/CPAP/SIPAP Pt Type Adult  BiPAP/CPAP/SIPAP Resmed  Mask Type Full face mask  Dentures removed? Not applicable  Mask Size Medium  Respiratory Rate 18 breaths/min  FiO2 (%) 21 %  Patient Home Machine No  Patient Home Mask No  Patient Home Tubing No  Auto Titrate Yes  Minimum cmH2O 5 cmH2O  Maximum cmH2O 20 cmH2O  Nasal massage performed Yes  CPAP/SIPAP surface wiped down Yes  Device Plugged into RED Power Outlet Yes  BiPAP/CPAP /SiPAP Vitals  Pulse Rate 79  Resp 18  SpO2 97 %

## 2024-06-26 NOTE — TOC Initial Note (Signed)
 Transition of Care [facility]) - Initial/Assessment Note    Patient Details  Name: Peter [name] MRN: [id number] Date of Birth: [birth year]  [facility]) CM/SW Contact:    Sonda Manuella Quill, RN Phone Number: 06/26/2024, 4:56 PM  Clinical Narrative:                 Pt d/c'd before TOC assessment could be completed.        Patient Goals and CMS Choice            Expected Discharge Plan and Services         Expected Discharge Date: 06/26/24                                    Prior Living Arrangements/Services                       Activities of Daily Living   ADL Screening (condition at time of admission) Independently performs ADLs?: Yes (appropriate for developmental age) Is the patient deaf or have difficulty hearing?: No Does the patient have difficulty seeing, even when wearing glasses/contacts?: No Does the patient have difficulty concentrating, remembering, or making decisions?: No  Permission Sought/Granted                  Emotional Assessment              Admission diagnosis:  SVT (supraventricular tachycardia) (HCC) [I47.10] Patient Active Problem List   Diagnosis Date Noted   SVT (supraventricular tachycardia) (HCC) 06/26/2024   AKI (acute kidney injury) (HCC) 06/26/2024   History of lung cancer 06/26/2024   Encounter for antineoplastic chemotherapy 05/10/2024   Rash 04/14/2024   Hypotension 04/14/2024   Cytopenia 04/14/2024   Neutropenia (HCC) 04/09/2024   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) 03/16/2024   Abnormal CT of the chest 02/09/2024   CKD (chronic kidney disease) stage 3, GFR 30-59 ml/min (HCC) 01/23/2024   Gout 01/23/2024   CHF (congestive heart failure) (HCC) 01/23/2024   Hypertensive urgency 01/22/2024   Hyperglycemia 01/22/2024   Acute respiratory failure with hypoxia (HCC) 12/20/2023   Influenza A 12/19/2023   Sepsis (HCC) 12/19/2023   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   Degenerative joint disease (DJD) of hip 07/16/2022   Arthritis of carpometacarpal (CMC) joint of right thumb 04/16/2022   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   Chronic right shoulder pain 03/07/2022   Pain of right hand 03/07/2022   Diverticulitis 02/28/2018   RLQ abdominal pain    Sigmoid diverticulitis    Acute blood loss anemia 11/13/2017   Anticoagulation adequate 11/12/2016   Epistaxis, recurrent 11/12/2016   Paroxysmal atrial fibrillation (HCC) 10/24/2016   Obesity 08/09/2016   Atrial fibrillation with RVR (HCC) 08/03/2016   New onset a-fib (HCC) 07/16/2016   Persistent atrial fibrillation (HCC) 07/16/2016   Acute systolic (congestive) heart failure (HCC)    Ejection fraction    Iron deficiency anemia due to chronic blood loss 02/04/2012   Anemia 02/03/2012   CKD (chronic kidney disease), stage III (HCC) 02/03/2012   DOE (dyspnea on exertion) 01/28/2012   Hypertension    CAD S/P percutaneous coronary angioplasty    Dyslipidemia    GERD (gastroesophageal reflux disease)    PCP:  [name], MD Pharmacy:   [facility] - [place], [place] - [address] [place] [address] Phone: [phone] Fax: [phone]  [id number] - [place], [place] - [address] AT [facility] [address] [place] [address] Phone: [phone] Fax: [phone]  [id number] - [place], [place] - [address] AT CORNER OF [address] [place] [address] Phone: [phone] Fax: [phone]     Social Drivers of Health (SDOH) Social History: SDOH Screenings   Food Insecurity: No Food Insecurity (06/26/2024)  Housing: High Risk (06/26/2024)  Transportation Needs: No Transportation Needs (06/26/2024)  Utilities: Not At Risk (06/26/2024)  Depression (PHQ2-9): Low Risk  (06/14/2024)  Social Connections:  Moderately Isolated (06/26/2024)  Tobacco Use: Low Risk  (06/25/2024)   SDOH Interventions:     Readmission Risk Interventions     No data to display

## 2024-06-26 NOTE — Plan of Care (Signed)

## 2024-06-26 NOTE — Consult Note (Signed)
 CARDIOLOGY CONSULT NOTE    Patient ID: Peter [name] MRN: [id number], DOB/AGE: Oct 12, 1946 78 y.o.  Admit date: 06/25/2024 Date of Consult: 06/26/2024  Primary Physician: [name], MD Primary Cardiologist: Dr. Burnard Electrophysiologist: Dr. Inocencio  Patient Profile: Peter [name] is a 78 y.o. male with a history of atrial fibrillation, coronary artery disease status post PCI to the LAD in 2006, CHFpEF, GI bleed, CKD 3, hypertension, obesity, OSA who is being seen today for the evaluation of SVT at the request of Dr. Alfornia.  HPI:  Peter [name] is a 78 y.o. male with a history of coronary disease, heart failure with preserved ejection fraction and atrial fibrillation. he underwent ablation for atrial fibrillation and typical appearing flutter by Dr. Inocencio in December 2017.  He was diagnosed with stage IIIa non-small cell lung cancer in April 2025 and treated with chemoimmunotherapy which is halted due to intolerance.  He is currently receiving chemoradiation.  He was admitted yesterday with complaints of heart racing.  He just finished chemotherapy, and after his final radiation treatment yesterday, he took a nap and awoke with a racing heart and shortness of breath.  He had taken Coreg  25 mg twice daily in the past but this was decreased to 25 mg daily due to hypotension.  In the ER, he presented with heart rates in the 130s and noted to have salvos with rates up to the 170s.  There were no other apparent issues such as infection, chest pain, respiratory distress.  His dose of carvedilol  was increased back to 25 mg twice daily, and he was given IV fluids.  He denies chest pain, palpitations, dyspnea, PND, orthopnea, nausea, vomiting, dizziness, syncope, edema, or weight gain.  Past Medical History:  Diagnosis Date   Arthritis    At risk for sleep apnea    STOP-BANG= 5       SENT TO PCP 07-12-2015   CAD (coronary artery disease)    a. s/p PCI with DES x 3 to LAD in  2006 with bifurcation lesion and Plavix  indefinitely was recommended.   Chronic combined systolic and diastolic CHF (congestive heart failure) (HCC)    a. Prior EF normal but EF dropped to 30-35% in 07/2016 in setting of AFIB.   CKD (chronic kidney disease), stage III (HCC)    Depression    Dyslipidemia    Dyspnea    tired quick d/t Afib   Dyspnea on exertion    Dysrhythmia    A.FIB   GERD (gastroesophageal reflux disease)    History of GI bleed    History of kidney stones    History of MI (myocardial infarction)    11-05-2005--  periprocedural MI  (cardiac cath) per dr obie note   Hypertension    Hypertriglyceridemia    Iron deficiency anemia    Myocardial infarction [facility])    Persistent atrial fibrillation (HCC)    a. 07/2016, started on Eliquis  -> readmitted later that month for AF RVR/acute HF - initially unsuccessful DCCV following TEE, so loaded with amio with repeat successful DCCV 07/2816.   Right ureteral stone    S/P drug eluting coronary stent placement    x3  to LAD  2006   Sleep apnea    wears BIPAP, 14 up to 24      Home medications Medications Prior to Admission  Medication Sig Dispense Refill Last Dose/Taking   allopurinol  (ZYLOPRIM ) 300 MG tablet Take 300 mg by mouth in the morning.   06/25/2024  Morning   atorvastatin  (LIPITOR) 40 MG tablet TAKE 1 TABLET EVERY DAY (NEED MD APPOINTMENT, CALL [phone]) 90 tablet 1 06/25/2024 Morning   carvedilol  (COREG ) 25 MG tablet TAKE 1 TABLET TWICE DAILY WITH MEALS (Patient taking differently: Take 25 mg by mouth in the morning.) 180 tablet 0 06/25/2024 Morning   Cyanocobalamin  (VITAMIN B-12 PO) Take 1 tablet by mouth daily.   06/25/2024 Morning   diphenhydramine -acetaminophen  (TYLENOL  PM) 25-500 MG TABS tablet Take 1 tablet by mouth at bedtime.   Past Week   ferrous sulfate  325 (65 FE) MG tablet Take 1 tablet (325 mg total) by mouth every morning. 90 tablet 2 06/25/2024 Morning   folic acid  (FOLVITE ) 1 MG tablet Take 1 mg by  mouth daily.   06/25/2024   furosemide  (LASIX ) 40 MG tablet TAKE 1 TABLET EVERY DAY (Patient taking differently: Take 20 mg by mouth daily.) 90 tablet 0 06/25/2024 Morning   hydrocortisone  1 % lotion Apply 1 Application topically 2 (two) times daily. 118 mL 0 06/26/2024 Morning   nitroGLYCERIN  (NITROSTAT ) 0.4 MG SL tablet Place 1 tablet (0.4 mg total) under the tongue every 5 (five) minutes as needed for chest pain. X 3 doses 25 tablet 5 Unknown   pantoprazole  (PROTONIX ) 40 MG tablet Take 40 mg by mouth daily.   06/25/2024   polyvinyl alcohol  (LIQUIFILM TEARS) 1.4 % ophthalmic solution Place 1 drop into both eyes daily as needed for dry eyes.   Unknown   rivaroxaban  (XARELTO ) 20 MG TABS tablet Take 1 tablet (20 mg total) by mouth at bedtime. Okay to restart this medication on 02/10/24 90 tablet 1 06/25/2024 at  6:00 PM   albuterol  (VENTOLIN  HFA) 108 (90 Base) MCG/ACT inhaler Inhale 2 puffs into the lungs every 6 (six) hours as needed for wheezing or shortness of breath (intractable cough). (Patient not taking: Reported on 05/10/2024) 8 g 2 Not Taking   allopurinol  (ZYLOPRIM ) 100 MG tablet Take 100 mg by mouth at bedtime.   06/24/2024   benzonatate  (TESSALON  PERLES) 100 MG capsule Take 2 capsules (200 mg total) by mouth 2 (two) times daily as needed for cough. (Patient not taking: Reported on 05/10/2024) 30 capsule 1 Not Taking   budeson-glycopyrrolate-formoterol (BREZTRI  AEROSPHERE) 160-9-4.8 MCG/ACT AERO Inhale 2 puffs into the lungs in the morning and at bedtime. (Patient not taking: No sig reported)   Not Taking   chlorproMAZINE  (THORAZINE ) 10 MG tablet Take 1 tablet (10 mg total) by mouth 3 (three) times daily as needed. (Patient not taking: Reported on 06/26/2024) 15 tablet 0 Not Taking   lisinopril  (ZESTRIL ) 40 MG tablet TAKE 1 TABLET EVERY DAY (Patient not taking: Reported on 05/24/2024) 90 tablet 3    triamcinolone  cream (KENALOG ) 0.1 % Apply 1 Application topically 2 (two) times daily. (Patient not taking:  Reported on 06/26/2024) 453.6 g 0 Not Taking      Physical Exam: Vitals:   06/26/24 0232 06/26/24 0300 06/26/24 0348 06/26/24 0642  BP: 128/82   102/83  Pulse: (!) 101  79 78  Resp: 20 16 18  (!) 22  Temp: 98.4 F (36.9 C)   97.6 F (36.4 C)  TempSrc: Oral   Oral  SpO2: 98%  97% 99%  Weight:      Height:        Gen: Appears comfortable, well-nourished CV: RRR, no dependent edema Pulm: breathing easily  PERTINENT STUDIES SUMMARIZED:  Echocardiogram:      TTE February 2025 EF 55 to 60%.  Grade 2 diastolic dysfunction.  Imaging:   CT angio -- reviewed   EKG:   Admission EKG appears to show atrial flutter vs atrial tachycardia; subsequent ECGs show sinus (personally reviewed)  TELEMETRY:    Sinus rhythm (personally reviewed)     ASSESSMENT & PLAN:  SVT - suspected atrial flutter  Pt has converted to sinus rhythm Appears to be triggered by the physiologic stress of his chemoradiation treatment Continue carvedilol  25mg  PO BID IF hypotension remains an issue despite discontinuation of chemoradiation, would switch to metoprolol  I think the patient is suitable for discharge today. He reports he is soon due for follow-up with Dr. Inocencio and will go ahead and schedule his appointment  Secondary hypercoagulable state Continue xarelto  20 mg  Non-small cell lung cancer Finished chemoradiation therapy Per primary  CAD s/p PCI 2006 Stable - no significant anginal symptoms  Mild troponin elevation  Non-ACS; due to demand from tachycardia    For questions or updates, please contact [facility] Please consult [email or web address] for contact info under Cardiology/STEMI.  Signed, Eulas Furbish, MD 06/26/2024 8:58 AM

## 2024-06-26 NOTE — H&P (Signed)
 History and Physical    Peter [name] FMW:[id number] DOB: Apr 04, 1946 DOA: 06/25/2024  PCP: [name], MD  Patient coming from: Home  Chief Complaint: Heart racing  HPI: Peter [name] is a 78 y.o. male with medical history significant of stage IIIa non-small cell lung cancer diagnosed in April 2025 initially treated with chemoimmunotherapy but treatment was stopped due to intolerance and currently on chemoradiation, CAD status post PCI in 2006, chronic HFpEF, ILD, CKD stage III, depression, hyperlipidemia, GERD, hypertension, anemia, A-fib on anticoagulation, OSA on CPAP, gout presenting with a chief complaint of heart racing.  Patient states after his radiation treatment yesterday he was not feeling well so he went home and took a nap.  After he woke up, his heart was racing and he started feeling short of breath so he came into the ED to be evaluated.  He was not having any chest pain.  Patient states he used to take Coreg  25 mg twice daily but about a month ago his PCP cut down the dose of Coreg  to 25 mg once daily due to his blood pressure being low with systolic in the 90s.  He also used to be on lisinopril  and Lasix .  Due to low blood pressure, lisinopril  was stopped and dose of Lasix  was cut in half.  Denies fevers.  Reports mild dry cough since after he was started on radiation treatments.  He takes Xarelto  for A-fib and has not missed any doses.  No other complaints.  Denies nausea, vomiting, abdominal pain, or diarrhea.  ED Course: Initially tachycardic to the 130s on arrival but later started having frequent runs of SVT with heart rate in the 170s. Afebrile. Not hypotensive or hypoxic. He was given IV metoprolol  5 mg and 1 L IV fluids after which his heart rate improved to 110. Patient used to take Coreg  25 mg twice daily but now only taking it once a day due to report of low blood pressure from cancer treatment. EDP spoke to on-call cardiologist who recommended increasing dose  of Coreg  back to 25 mg twice daily for now and they will consult in the morning. He can have additional metoprolol  if necessary. Labs showing no leukocytosis, hemoglobin 10.3 (stable), MCV 103.3, potassium 4.5, bicarb 17, glucose 183, BUN 38, creatinine 1.36 (baseline 1.0), initial troponin 28 and repeat pending. CT angiogram chest negative for acute PE. 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 No pneumonia, pulmonary edema, or significant pleural effusion seen.   Review of Systems:  ROS  Past Medical History:  Diagnosis Date   Arthritis    At risk for sleep apnea    STOP-BANG= 5       SENT TO PCP 07-12-2015   CAD (coronary artery disease)    a. s/p PCI with DES x 3 to LAD in 2006 with bifurcation lesion and Plavix  indefinitely was recommended.   Chronic combined systolic and diastolic CHF (congestive heart failure) (HCC)    a. Prior EF normal but EF dropped to 30-35% in 07/2016 in setting of AFIB.   CKD (chronic kidney disease), stage III (HCC)    Depression    Dyslipidemia    Dyspnea    tired quick d/t Afib   Dyspnea on exertion    Dysrhythmia    A.FIB   GERD (gastroesophageal reflux disease)    History of GI bleed    History of kidney stones    History of MI (myocardial infarction)    11-05-2005--  periprocedural MI  (  cardiac cath) per dr obie note   Hypertension    Hypertriglyceridemia    Iron deficiency anemia    Myocardial infarction Peter [name])    Persistent atrial fibrillation (HCC)    a. 07/2016, started on Eliquis  -> readmitted later that month for AF RVR/acute HF - initially unsuccessful DCCV following TEE, so loaded with amio with repeat successful DCCV 07/2816.   Right ureteral stone    S/P drug eluting coronary stent placement    x3  to LAD  2006   Sleep apnea    wears BIPAP, 14 up to 24    Past Surgical History:  Procedure Laterality Date   BRONCHIAL BIOPSY  02/09/2024   Procedure: BRONCHOSCOPY, WITH BIOPSY;  Surgeon: Shelah Lamar RAMAN, MD;   Location: [facility] ENDOSCOPY;  Service: Pulmonary;;   BRONCHIAL BRUSHINGS  02/09/2024   Procedure: BRONCHOSCOPY, WITH BRUSH BIOPSY;  Surgeon: Shelah Lamar RAMAN, MD;  Location: [facility] ENDOSCOPY;  Service: Pulmonary;;   BRONCHIAL NEEDLE ASPIRATION BIOPSY  02/09/2024   Procedure: BRONCHOSCOPY, WITH NEEDLE ASPIRATION BIOPSY;  Surgeon: Shelah Lamar RAMAN, MD;  Location: [facility] ENDOSCOPY;  Service: Pulmonary;;   BRONCHIAL WASHINGS  02/09/2024   Procedure: IRRIGATION, BRONCHUS;  Surgeon: Shelah Lamar RAMAN, MD;  Location: [facility] ENDOSCOPY;  Service: Pulmonary;;   CARDIOVASCULAR STRESS TEST  01-06-2013  dr micky   normal perfusion study/  no ischemia or infarct/  normal LV function and wall motion , ef 57%   CARDIOVERSION N/A 08/05/2016   Procedure: CARDIOVERSION;  Surgeon: Jerel Balding, MD;  Location: [facility] ENDOSCOPY;  Service: Cardiovascular;  Laterality: N/A;   CARDIOVERSION N/A 08/08/2016   Procedure: CARDIOVERSION;  Surgeon: Oneil JAYSON Parchment, MD;  Location: [facility] ENDOSCOPY;  Service: Cardiovascular;  Laterality: N/A;   CORONARY ANGIOPLASTY  11-13-2005 dr obie   Successful touch up post-dilatation of the 3 tandem overlying Cypher stents in proximal to mid LAD (based on IVUS 0% stenosis and patent diagonal branch)/  Cutting Balloon Angioplasty of Ramus branch   CORONARY ANGIOPLASTY WITH STENT PLACEMENT  11-05-2005   dr wolm brodie   PCI and DES x3 to birfurcation lesion LAD and diagonal branch of LAD/   20% dLM,  30-40% RCA, ef 60%   CYSTOSCOPY WITH RETROGRADE PYELOGRAM, URETEROSCOPY AND STENT PLACEMENT Right 07/14/2015   Procedure: CYSTOSCOPY WITH RETROGRADE PYELOGRAM, URETEROSCOPY AND STENT PLACEMENT;  Surgeon: Ricardo Likens, MD;  Location: [facility];  Service: Urology;  Laterality: Right;   ELECTROPHYSIOLOGIC STUDY N/A 10/24/2016   Procedure: Atrial Fibrillation Ablation;  Surgeon: Will Gladis Norton, MD;  Location: [facility] INVASIVE CV LAB;  Service: Cardiovascular;  Laterality: N/A;   EXTRACORPOREAL SHOCK WAVE  LITHOTRIPSY  yrs ago   HEMORRHOID SURGERY N/A 10/24/2017   Procedure: HEMORRHOIDECTOMY;  Surgeon: Teresa Lonni HERO, MD;  Location: [facility] OR;  Service: General;  Laterality: N/A;   HERNIA REPAIR Bilateral 2000   Inguinal Hernia   IR GENERIC HISTORICAL  10/23/2016   IR FLUORO GUIDE CV LINE RIGHT 10/23/2016 Ozell Specking, MD [facility]   IR GENERIC HISTORICAL  10/23/2016   IR US  GUIDE VASC ACCESS RIGHT 10/23/2016 Ozell Specking, MD [facility]   LEFT URETEROSCOPIC STONE EXTRACTION  04/10/2006   STONE EXTRACTION WITH BASKET Right 07/14/2015   Procedure: STONE EXTRACTION WITH BASKET;  Surgeon: Ricardo Likens, MD;  [name];  Service: Urology;  Laterality: Right;   TEE WITHOUT CARDIOVERSION N/A 08/05/2016   Procedure: TRANSESOPHAGEAL ECHOCARDIOGRAM (TEE);  Surgeon: Jerel Balding, MD;  Location[name]COPY;  Service: Cardiovascular;  Laterality: N/A;  TOTAL HIP ARTHROPLASTY Right 07/16/2022   Procedure: RIGHT TOTAL HIP ARTHROPLASTY ANTERIOR APPROACH;  Surgeon: Jerri Kay HERO, MD;  Location: [facility] OR;  Service: Orthopedics;  Laterality: Right;  3-C   TRANSTHORACIC ECHOCARDIOGRAM  02/04/2012   grade I diastolic dysfunction/  ef 55-60%/  trivial MR and TR/  mild LAE   VIDEO BRONCHOSCOPY WITH ENDOBRONCHIAL NAVIGATION Right 02/09/2024   Procedure: VIDEO BRONCHOSCOPY WITH ENDOBRONCHIAL NAVIGATION;  Surgeon: Shelah Lamar RAMAN, MD;  Location: [name] ENDOSCOPY;  Service: Pulmonary;  Laterality: Right;  WITH FLOURO     reports that he has never smoked. He has been exposed to tobacco smoke. He has never used smokeless tobacco. He reports that he does not drink alcohol  and does not use drugs.  Allergies  Allergen Reactions   Prednisone  Other (See Comments)    Made BS increase to 500- discovered on 01/22/24 Cannot take dose pack   Spironolactone  Other (See Comments)    Tender breast    Family History  Problem Relation Age of Onset   Heart attack Father 62   Congestive Heart  Failure Father 58   Cancer Paternal Uncle    Cancer Other     Prior to Admission medications   Medication Sig Start Date End Date Taking? Authorizing Provider  albuterol  (VENTOLIN  HFA) 108 (90 Base) MCG/ACT inhaler Inhale 2 puffs into the lungs every 6 (six) hours as needed for wheezing or shortness of breath (intractable cough). Patient not taking: Reported on 05/10/2024 12/25/23   Raenelle Coria, MD  allopurinol  (ZYLOPRIM ) 100 MG tablet Take 100 mg by mouth at bedtime. 11/19/21   [provider]  allopurinol  (ZYLOPRIM ) 300 MG tablet Take 300 mg by mouth in the morning.    [provider]  atorvastatin  (LIPITOR) 40 MG tablet TAKE 1 TABLET EVERY DAY (NEED MD APPOINTMENT, CALL [phone]) 06/21/24   Camnitz, Soyla Lunger, MD  benzonatate  (TESSALON  PERLES) 100 MG capsule Take 2 capsules (200 mg total) by mouth 2 (two) times daily as needed for cough. Patient not taking: Reported on 05/10/2024 03/08/24 03/08/25  Ruthell Lauraine FALCON, NP  budeson-glycopyrrolate-formoterol (BREZTRI  AEROSPHERE) 160-9-4.8 MCG/ACT AERO Inhale 2 puffs into the lungs in the morning and at bedtime. Patient not taking: Reported on 05/10/2024 02/16/24   Ruthell Lauraine FALCON, NP  carvedilol  (COREG ) 25 MG tablet TAKE 1 TABLET TWICE DAILY WITH MEALS 04/19/24   Camnitz, Soyla Lunger, MD  chlorproMAZINE  (THORAZINE ) 10 MG tablet Take 1 tablet (10 mg total) by mouth 3 (three) times daily as needed. 05/11/24   Heilingoetter, Cassandra L, PA-C  Cyanocobalamin  (VITAMIN B-12 PO) Take 1 tablet by mouth daily.    [provider]  diphenhydramine -acetaminophen  (TYLENOL  PM) 25-500 MG TABS tablet Take 1 tablet by mouth at bedtime.    [provider]  ferrous sulfate  325 (65 FE) MG tablet Take 1 tablet (325 mg total) by mouth every morning. 10/27/18   Camnitz, Soyla Lunger, MD  furosemide  (LASIX ) 40 MG tablet TAKE 1 TABLET EVERY DAY 04/19/24   Camnitz, Soyla Lunger, MD  hydrocortisone  1 % lotion Apply 1 Application topically 2 (two)  times daily. 04/01/24   Sherrod Sherrod, MD  lisinopril  (ZESTRIL ) 40 MG tablet TAKE 1 TABLET EVERY DAY Patient not taking: Reported on 05/24/2024 03/29/24   Camnitz, Soyla Lunger, MD  nitroGLYCERIN  (NITROSTAT ) 0.4 MG SL tablet Place 1 tablet (0.4 mg total) under the tongue every 5 (five) minutes as needed for chest pain. X 3 doses 04/09/23   Swinyer, Rosaline HERO, NP  pantoprazole  (PROTONIX ) 40  MG tablet Take 40 mg by mouth daily. 10/21/14   [provider]  polyvinyl alcohol  (LIQUIFILM TEARS) 1.4 % ophthalmic solution Place 1 drop into both eyes daily as needed for dry eyes.    [provider]  rivaroxaban  (XARELTO ) 20 MG TABS tablet Take 1 tablet (20 mg total) by mouth at bedtime. Okay to restart this medication on 02/10/24 06/22/24   Jerilynn Lamarr HERO, NP  sertraline  (ZOLOFT ) 25 MG tablet Take 25 mg by mouth daily. 06/17/22   [provider]  triamcinolone  cream (KENALOG ) 0.1 % Apply 1 Application topically 2 (two) times daily. 04/14/24   Heilingoetter, Cassandra LITTIE, PA-C    Physical Exam: Vitals:   06/26/24 0015 06/26/24 0045 06/26/24 0100 06/26/24 0109  BP:   (!) 129/95   Pulse: (!) 102 (!) 106 (!) 105   Resp: (!) 22 15 (!) 22   Temp:    98.6 F (37 C)  TempSrc:    Oral  SpO2: 98% 97% 96%   Weight:      Height:        Physical Exam  Labs on Admission: I have personally reviewed following labs and imaging studies  CBC: Recent Labs  Lab 06/21/24 0853 06/25/24 2010  WBC 4.2 5.9  NEUTROABS 3.5  --   HGB 10.1* 10.3*  HCT 28.9* 31.0*  MCV 99.3 103.3*  PLT 112* 157   Basic Metabolic Panel: Recent Labs  Lab 06/21/24 0853 06/25/24 2010  NA 138 138  K 4.3 4.5  CL 109 109  CO2 21* 17*  GLUCOSE 178* 183*  BUN 23 38*  CREATININE 1.06 1.36*  CALCIUM  8.7* 9.2   GFR: Estimated Creatinine Clearance: 49.2 mL/min (A) (by C-G formula based on SCr of 1.36 mg/dL (H)). Liver Function Tests: Recent Labs  Lab 06/21/24 0853 06/25/24 2030  AST 24 36  ALT 19 24   ALKPHOS 90 80  BILITOT 0.6 1.0  PROT 6.5 6.7  ALBUMIN 4.0 3.7   No results for input(s): LIPASE, AMYLASE in the last 168 hours. No results for input(s): AMMONIA in the last 168 hours. Coagulation Profile: No results for input(s): INR, PROTIME in the last 168 hours. Cardiac Enzymes: No results for input(s): CKTOTAL, CKMB, CKMBINDEX, TROPONINI in the last 168 hours. BNP (last 3 results) No results for input(s): PROBNP in the last 8760 hours. HbA1C: No results for input(s): HGBA1C in the last 72 hours. CBG: Recent Labs  Lab 06/23/24 1341  GLUCAP 206*   Lipid Profile: No results for input(s): CHOL, HDL, LDLCALC, TRIG, CHOLHDL, LDLDIRECT in the last 72 hours. Thyroid  Function Tests: No results for input(s): TSH, T4TOTAL, FREET4, T3FREE, THYROIDAB in the last 72 hours. Anemia Panel: No results for input(s): VITAMINB12, FOLATE, FERRITIN, TIBC, IRON, RETICCTPCT in the last 72 hours. Urine analysis:    Component Value Date/Time   COLORURINE YELLOW 07/30/2022 0726   APPEARANCEUR CLEAR 07/30/2022 0726   LABSPEC >1.046 (H) 07/30/2022 0726   PHURINE 5.0 07/30/2022 0726   GLUCOSEU NEGATIVE 07/30/2022 0726   HGBUR SMALL (A) 07/30/2022 0726   BILIRUBINUR NEGATIVE 07/30/2022 0726   KETONESUR NEGATIVE 07/30/2022 0726   PROTEINUR NEGATIVE 07/30/2022 0726   NITRITE NEGATIVE 07/30/2022 0726   LEUKOCYTESUR NEGATIVE 07/30/2022 0726    Radiological Exams on Admission: CT Angio Chest PE W and/or Wo Contrast Result Date: 06/25/2024 CLINICAL DATA:  Weakness short of breath EXAM: CT ANGIOGRAPHY CHEST WITH CONTRAST TECHNIQUE: Multidetector CT imaging of the chest was performed using the standard protocol during bolus administration  of intravenous contrast. Multiplanar CT image reconstructions and MIPs were obtained to evaluate the vascular anatomy. RADIATION DOSE REDUCTION: This exam was performed according to the departmental  dose-optimization program which includes automated exposure control, adjustment of the mA and/or kV according to patient size and/or use of iterative reconstruction technique. CONTRAST:  75mL OMNIPAQUE  IOHEXOL  350 MG/ML SOLN COMPARISON:  CT 04/08/2024, PET CT 02/23/2024, prior exams dating back to 12/22/2023 FINDINGS: Cardiovascular: Satisfactory opacification of the pulmonary arteries to the segmental level. No evidence of pulmonary embolism. 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 Mild aortic atherosclerosis. No aneurysm. Multi vessel coronary vascular calcification. Cardiomegaly. No pericardial effusion Mediastinum/Nodes: Patent trachea. No thyroid  mass. Enlarged right paratracheal node measures 13 mm compared with 14 mm previously. Mild right hilar soft tissue density as before. Several subcentimeter subcarinal nodes measuring up to 8 mm. Moderate to large hiatal hernia. Lungs/Pleura: Cavitary masslike density at the right apex measures about 5.2 by 3 cm fundus series 12, image 32, previously 5.8 x 3 cm, and extends to the right apical pleural surface. Cavitary masslike consolidation at the superior right lower lobe measures about 3.6 x 3 cm on series 12 image 52, previously about 3.4 x 3.9 cm Underlying chronic lung disease with subpleural reticulation and interstitial densities. Grossly similar right middle lobe nodularity, series 12, image 57. Medial right lower lobe nodule measures 11 x 9 mm on series 12, image 58, previously 17 x 9 mm. Small irregular nodule within the anterior right lower lobe measures 7 mm on series 12, image 65. No significant pleural effusion mild posterior pleural thickening. Upper Abdomen: No acute finding.  Gallstone. Musculoskeletal: No acute or suspicious osseous abnormality. Review of the MIP images confirms the above findings. IMPRESSION: 1. Negative for acute pulmonary embolus. 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 2. Cavitary and masslike  densities corresponding to history of malignancy at the right apex and superior right lower lobe are grossly similar to prior exam. Grossly similar suspected satellite nodules in the right middle and lower lobes. 3. Underlying chronic interstitial lung disease without acute superimposed airspace disease or significant pleural effusion. 4. Similar mediastinal adenopathy and mild right hilar soft tissue density. 5. Moderate to large hiatal hernia. 6. Aortic atherosclerosis. Aortic Atherosclerosis (ICD10-I70.0). Electronically Signed   By: Luke Bun M.D.   On: 06/25/2024 23:45   DG CHEST PORT 1 VIEW Result Date: 06/25/2024 CLINICAL DATA:  Short of breath EXAM: PORTABLE CHEST 1 VIEW COMPARISON:  CT chest 04/08/2024 FINDINGS: Grossly normal cardiac silhouette. RIGHT suprahilar and RIGHT perihilar mass similar comparison CT. Low lung volumes on the RIGHT. LEFT lung relatively clear. IMPRESSION: RIGHT perihilar masses are similar to comparison CT. No clear acute findings Electronically Signed   By: Jackquline Boxer M.D.   On: 06/25/2024 21:27    EKG: Independently reviewed. Sinus or ectopic atrial tachycardia, QTc 512, RBBB is not new.   Assessment and Plan  SVT Initially tachycardic to the 130s on arrival to the ED but later started having frequent runs of SVT with heart rate in the 170s.  He was given 1 L IV fluids and IV metoprolol  5 mg after which his heart rate improved.  Currently in sinus tachycardia with heart rate 100-110.  Blood pressure stable.  Patient states he was previously on Coreg  25 mg twice daily but dose was cut down to once daily a month ago due to hypotension.  CT angiogram chest negative for acute PE.  No obvious signs of infection.  Potassium within  normal range.  EDP spoke to on-call cardiologist who recommended increasing dose of Coreg  back to 25 mg twice daily and cardiology will consult in the morning.  He can have additional metoprolol  as needed for tachycardia.  Continue cardiac  monitoring.  Check TSH and magnesium  level.  AKI Metabolic acidosis AKI possibly prerenal in etiology from dehydration.  BUN 38, creatinine 1.36 (baseline 1.0), bicarb 17 with normal anion gap.  Continue gentle IV fluid hydration and monitor metabolic panel.  Avoid nephrotoxic agents.  Paroxysmal A-fib Continue Coreg  and Xarelto .  Stage IIIa non-small cell lung cancer Diagnosed in April 2025 initially treated with chemoimmunotherapy but treatment was stopped due to intolerance and he is currently on chemoradiation.  Outpatient follow-up.  CAD status post PCI in 2006 Troponin mildly elevated but stable (28> 25) in the setting of tachycardia/SVT.  ACS less likely as patient is not endorsing chest pain.  Continue Coreg  and Lipitor.  Chronic HFpEF Last echo done in February 2025 showing EF 55 to 60%, grade 2 diastolic dysfunction, mildly elevated pulmonary artery systolic pressure, and trivial mitral regurgitation.  No signs of volume overload at this time.  Hyperlipidemia Continue Lipitor.  GERD Continue Protonix .  Hypertension Blood pressure stable.  Continue Coreg .  OSA Continue nightly CPAP.  Gout Continue allopurinol .  Chronic macrocytic anemia Hemoglobin stable.  DVT prophylaxis: Xarelto  Code Status: Full Code (discussed with the patient) Family Communication: No family available at this time. Consults called: Cardiology Level of care: Progressive Care Unit Admission status: It is my clinical opinion that referral for OBSERVATION is reasonable and necessary in this patient based on the above information provided. The aforementioned taken together are felt to place the patient at high risk for further clinical deterioration. However, it is anticipated that the patient may be medically stable for discharge from the hospital within 24 to 48 hours.  Editha Ram MD [facility]  If 7PM-7AM, please contact night-coverage [email or web address]  06/26/2024, 1:43 AM

## 2024-06-26 NOTE — Discharge Summary (Addendum)
 Physician Discharge Summary   Patient: Peter [name] MRN: [id number] DOB: 26-Feb-1946  Admit date:     06/25/2024  Discharge date: 06/26/24  Discharge Physician: Lebron JINNY Cage   PCP: [name], MD   Recommendations at discharge:   Follow-up in PCP in 1 week Follow-up with EP/cardiology as scheduled  Discharge Diagnoses: Principal Problem:   SVT (supraventricular tachycardia) (HCC) Active Problems:   CAD S/P percutaneous coronary angioplasty   Paroxysmal atrial fibrillation (HCC)   AKI (acute kidney injury) (HCC)   History of lung cancer    Hospital Course: FARZAD TIBBETTS is a 78 y.o. male with medical history significant of stage IIIa non-small cell lung cancer diagnosed in April 2025 initially treated with chemoimmunotherapy but treatment was stopped due to intolerance and currently on chemoradiation, CAD status post PCI in 2006, chronic HFpEF, ILD, CKD stage III, depression, hyperlipidemia, GERD, hypertension, anemia, A-fib on anticoagulation, OSA on CPAP, gout presenting with a chief complaint of heart racing. Patient states after his radiation treatment on 06/25/24, noted his heart was racing and he started feeling short of breath so he came into the ED to be evaluated.  He was not having any chest pain.  Patient states he used to take Coreg  25 mg twice daily but about a month ago his PCP cut down the dose of Coreg  to 25 mg once daily due to his blood pressure being low with systolic in the 90s.  He also used to be on lisinopril  and Lasix .  Due to low blood pressure, lisinopril  was stopped and dose of Lasix  was cut in half.  Denies fevers. He takes Xarelto  for A-fib and has not missed any doses.  In the ED, initially tachycardic to the 130s on arrival but later started having frequent runs of SVT with heart rate in the 170s. Not hypotensive or hypoxic. EDP spoke to on-call cardiologist who recommended increasing dose of Coreg  back to 25 mg twice daily for now and they  will consult.  Labs fairly stable except for some AKI with creatinine 1.36 (baseline 1.0). CT angiogram chest negative for acute PE. 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  Patient admitted for further management.   Today, patient denies any new complaints.  Ambulated without any issues.  Heart rate has normalized.  Stable to DC from a cardiology standpoint   Assessment and Plan:  SVT Resolved Likely from post radiation, dehydration Heart rate controlled TSH WNL CT angiogram chest negative for acute PE, no obvious signs of infection Cardiology consulted, recommend continuing Coreg  25 mg twice daily Continue Coreg , Xarelto  Follow-up with cardiology/EP   AKI Metabolic acidosis Improved AKI possibly prerenal in etiology from dehydration BUN 38, creatinine 1.36 (baseline 1.0), bicarb 17 S/p gentle IV fluid hydration Follow-up with PCP  Hypomagnesemia Replace as needed   Paroxysmal A-fib Continue Coreg  and Xarelto    Stage IIIa non-small cell lung cancer Diagnosed in April 2025 initially treated with chemoimmunotherapy but treatment was stopped due to intolerance and he is currently on chemoradiation.  Outpatient follow-up.   CAD status post PCI in 2006 Troponin mildly elevated but stable (28> 25) in the setting of tachycardia/SVT.  ACS less likely as patient is not endorsing chest pain.  Continue Coreg  and Lipitor.   Chronic HFpEF Last echo done in February 2025 showing EF 55 to 60%, grade 2 diastolic dysfunction, mildly elevated pulmonary artery systolic pressure, and trivial mitral regurgitation.  No signs of volume overload at this time.   Hyperlipidemia Continue Lipitor.  GERD Continue Protonix .   Hypertension Blood pressure stable.  Continue Coreg .   OSA Continue nightly CPAP.   Gout Continue allopurinol .   Chronic macrocytic anemia Hemoglobin stable  Obesity type I Lifestyle modification advised    Consultants:  Cardiology Procedures performed: None Disposition: Home Diet recommendation:  Cardiac diet    DISCHARGE MEDICATION: Allergies as of 06/26/2024       Reactions   Prednisone  Other (See Comments)   Made BS increase to 500- discovered on 01/22/24 Cannot take dose pack   Spironolactone  Other (See Comments)   Tender breast        Medication List     PAUSE taking these medications    lisinopril  40 MG tablet Wait to take this until your doctor or other care provider tells you to start again. Commonly known as: ZESTRIL  TAKE 1 TABLET EVERY DAY       STOP taking these medications    benzonatate  100 MG capsule Commonly known as: Tessalon  Perles   chlorproMAZINE  10 MG tablet Commonly known as: THORAZINE    triamcinolone  cream 0.1 % Commonly known as: KENALOG        TAKE these medications    albuterol  108 (90 Base) MCG/ACT inhaler Commonly known as: VENTOLIN  HFA Inhale 2 puffs into the lungs every 6 (six) hours as needed for wheezing or shortness of breath (intractable cough).   allopurinol  300 MG tablet Commonly known as: ZYLOPRIM  Take 300 mg by mouth in the morning.   allopurinol  100 MG tablet Commonly known as: ZYLOPRIM  Take 100 mg by mouth at bedtime.   artificial tears ophthalmic solution Place 1 drop into both eyes daily as needed for dry eyes.   atorvastatin  40 MG tablet Commonly known as: LIPITOR TAKE 1 TABLET EVERY DAY (NEED MD APPOINTMENT, CALL [phone])   Breztri  Aerosphere 160-9-4.8 MCG/ACT Aero inhaler Generic drug: budesonide -glycopyrrolate-formoterol Inhale 2 puffs into the lungs in the morning and at bedtime.   carvedilol  25 MG tablet Commonly known as: COREG  TAKE 1 TABLET TWICE DAILY WITH MEALS What changed: when to take this   diphenhydramine -acetaminophen  25-500 MG Tabs tablet Commonly known as: TYLENOL  PM Take 1 tablet by mouth at bedtime.   ferrous sulfate  325 (65 FE) MG tablet Take 1 tablet (325 mg total) by mouth every  morning.   folic acid  1 MG tablet Commonly known as: FOLVITE  Take 1 mg by mouth daily.   furosemide  40 MG tablet Commonly known as: LASIX  Take 0.5 tablets (20 mg total) by mouth daily.   hydrocortisone  1 % lotion Apply 1 Application topically 2 (two) times daily.   nitroGLYCERIN  0.4 MG SL tablet Commonly known as: NITROSTAT  Place 1 tablet (0.4 mg total) under the tongue every 5 (five) minutes as needed for chest pain. X 3 doses   pantoprazole  40 MG tablet Commonly known as: PROTONIX  Take 40 mg by mouth daily.   rivaroxaban  20 MG Tabs tablet Commonly known as: Xarelto  Take 1 tablet (20 mg total) by mouth at bedtime. Okay to restart this medication on 02/10/24   VITAMIN B-12 PO Take 1 tablet by mouth daily.        Discharge Exam: Filed Weights   06/25/24 2013  Weight: 94.8 kg   General: NAD  Cardiovascular: S1, S2 present Respiratory: CTAB Abdomen: Soft, nontender, nondistended, bowel sounds present Musculoskeletal: No bilateral pedal edema noted Skin: Normal Psychiatry: Normal mood   Condition at discharge: stable  The results of significant diagnostics from this hospitalization (including imaging, microbiology, ancillary and laboratory) are listed below  for reference.   Imaging Studies: CT Angio Chest PE W and/or Wo Contrast Result Date: 06/25/2024 CLINICAL DATA:  Weakness short of breath EXAM: CT ANGIOGRAPHY CHEST WITH CONTRAST TECHNIQUE: Multidetector CT imaging of the chest was performed using the standard protocol during bolus administration of intravenous contrast. Multiplanar CT image reconstructions and MIPs were obtained to evaluate the vascular anatomy. RADIATION DOSE REDUCTION: This exam was performed according to the departmental dose-optimization program which includes automated exposure control, adjustment of the mA and/or kV according to patient size and/or use of iterative reconstruction technique. CONTRAST:  75mL OMNIPAQUE  IOHEXOL  350 MG/ML SOLN  COMPARISON:  CT 04/08/2024, PET CT 02/23/2024, prior exams dating back to 12/22/2023 FINDINGS: Cardiovascular: Satisfactory opacification of the pulmonary arteries to the segmental level. No evidence of pulmonary embolism. 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 Mild aortic atherosclerosis. No aneurysm. Multi vessel coronary vascular calcification. Cardiomegaly. No pericardial effusion Mediastinum/Nodes: Patent trachea. No thyroid  mass. Enlarged right paratracheal node measures 13 mm compared with 14 mm previously. Mild right hilar soft tissue density as before. Several subcentimeter subcarinal nodes measuring up to 8 mm. Moderate to large hiatal hernia. Lungs/Pleura: Cavitary masslike density at the right apex measures about 5.2 by 3 cm fundus series 12, image 32, previously 5.8 x 3 cm, and extends to the right apical pleural surface. Cavitary masslike consolidation at the superior right lower lobe measures about 3.6 x 3 cm on series 12 image 52, previously about 3.4 x 3.9 cm Underlying chronic lung disease with subpleural reticulation and interstitial densities. Grossly similar right middle lobe nodularity, series 12, image 57. Medial right lower lobe nodule measures 11 x 9 mm on series 12, image 58, previously 17 x 9 mm. Small irregular nodule within the anterior right lower lobe measures 7 mm on series 12, image 65. No significant pleural effusion mild posterior pleural thickening. Upper Abdomen: No acute finding.  Gallstone. Musculoskeletal: No acute or suspicious osseous abnormality. Review of the MIP images confirms the above findings. IMPRESSION: 1. Negative for acute pulmonary embolus. 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 2. Cavitary and masslike densities corresponding to history of malignancy at the right apex and superior right lower lobe are grossly similar to prior exam. Grossly similar suspected satellite nodules in the right middle and lower lobes. 3. Underlying  chronic interstitial lung disease without acute superimposed airspace disease or significant pleural effusion. 4. Similar mediastinal adenopathy and mild right hilar soft tissue density. 5. Moderate to large hiatal hernia. 6. Aortic atherosclerosis. Aortic Atherosclerosis (ICD10-I70.0). Electronically Signed   By: Luke Bun M.D.   On: 06/25/2024 23:45   DG CHEST PORT 1 VIEW Result Date: 06/25/2024 CLINICAL DATA:  Short of breath EXAM: PORTABLE CHEST 1 VIEW COMPARISON:  CT chest 04/08/2024 FINDINGS: Grossly normal cardiac silhouette. RIGHT suprahilar and RIGHT perihilar mass similar comparison CT. Low lung volumes on the RIGHT. LEFT lung relatively clear. IMPRESSION: RIGHT perihilar masses are similar to comparison CT. No clear acute findings Electronically Signed   By: Jackquline Boxer M.D.   On: 06/25/2024 21:27    Microbiology: Results for orders placed or performed during the hospital encounter of 06/25/24  MRSA Next Gen by PCR, Nasal     Status: None   Collection Time: 06/26/24  3:09 AM   Specimen: Nasal Mucosa; Nasal Swab  Result Value Ref Range Status   MRSA by PCR Next Gen NOT DETECTED NOT DETECTED Final    Comment: (NOTE) The GeneXpert MRSA Assay (FDA approved for NASAL specimens only),  is one component of a comprehensive MRSA colonization surveillance program. It is not intended to diagnose MRSA infection nor to guide or monitor treatment for MRSA infections. Test performance is not FDA approved in patients less than 79 years old. Performed at [facility], [address] [place], [place] [address]     Labs: CBC: Recent Labs  Lab 06/21/24 0853 06/25/24 2010 06/26/24 0333  WBC 4.2 5.9 3.2*  NEUTROABS 3.5  --   --   HGB 10.1* 10.3* 9.0*  HCT 28.9* 31.0* 27.0*  MCV 99.3 103.3* 105.1*  PLT 112* 157 112*   Basic Metabolic Panel: Recent Labs  Lab 06/21/24 0853 06/25/24 2010 06/26/24 0333  NA 138 138 137  K 4.3 4.5 4.2  CL 109 109 107  CO2 21*  17* 19*  GLUCOSE 178* 183* 164*  BUN 23 38* 37*  CREATININE 1.06 1.36* 1.26*  CALCIUM  8.7* 9.2 8.8*  MG  --   --  1.6*   Liver Function Tests: Recent Labs  Lab 06/21/24 0853 06/25/24 2030  AST 24 36  ALT 19 24  ALKPHOS 90 80  BILITOT 0.6 1.0  PROT 6.5 6.7  ALBUMIN 4.0 3.7   CBG: Recent Labs  Lab 06/23/24 1341  GLUCAP 206*    Discharge time spent: less than 30 minutes.  Signed: Lebron JINNY Cage, MD [facility] 06/26/2024

## 2024-06-28 NOTE — Radiation Completion Notes (Addendum)
  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 He developed fatigue and erythema within the treatment field.   The patient will return in one month and will continue follow up with Dr. Sherrod as well.      Ronita Due, PA-C

## 2024-07-05 DIAGNOSIS — R739 Hyperglycemia, unspecified: Secondary | ICD-10-CM | POA: Diagnosis not present

## 2024-07-05 DIAGNOSIS — C3490 Malignant neoplasm of unspecified part of unspecified bronchus or lung: Secondary | ICD-10-CM | POA: Diagnosis not present

## 2024-07-05 DIAGNOSIS — B356 Tinea cruris: Secondary | ICD-10-CM | POA: Diagnosis not present

## 2024-07-05 DIAGNOSIS — I509 Heart failure, unspecified: Secondary | ICD-10-CM | POA: Diagnosis not present

## 2024-07-05 DIAGNOSIS — R42 Dizziness and giddiness: Secondary | ICD-10-CM | POA: Diagnosis not present

## 2024-07-05 DIAGNOSIS — I480 Paroxysmal atrial fibrillation: Secondary | ICD-10-CM | POA: Diagnosis not present

## 2024-07-07 ENCOUNTER — Other Ambulatory Visit: Payer: Self-pay | Admitting: Cardiology

## 2024-07-08 ENCOUNTER — Other Ambulatory Visit: Payer: Self-pay

## 2024-07-13 ENCOUNTER — Ambulatory Visit (HOSPITAL_COMMUNITY)
Admission: RE | Admit: 2024-07-13 | Discharge: 2024-07-13 | Disposition: A | Discharge status: Home / Self Care | Source: Ambulatory Visit | Attending: Internal Medicine | Admitting: Internal Medicine

## 2024-07-13 ENCOUNTER — Inpatient Hospital Stay: Attending: Internal Medicine

## 2024-07-13 DIAGNOSIS — I10 Essential (primary) hypertension: Secondary | ICD-10-CM | POA: Diagnosis not present

## 2024-07-13 DIAGNOSIS — D696 Thrombocytopenia, unspecified: Secondary | ICD-10-CM | POA: Insufficient documentation

## 2024-07-13 DIAGNOSIS — C3490 Malignant neoplasm of unspecified part of unspecified bronchus or lung: Secondary | ICD-10-CM

## 2024-07-13 DIAGNOSIS — Z888 Allergy status to other drugs, medicaments and biological substances status: Secondary | ICD-10-CM | POA: Diagnosis not present

## 2024-07-13 DIAGNOSIS — Z79899 Other long term (current) drug therapy: Secondary | ICD-10-CM | POA: Diagnosis not present

## 2024-07-13 DIAGNOSIS — C3431 Malignant neoplasm of lower lobe, right bronchus or lung: Secondary | ICD-10-CM | POA: Diagnosis not present

## 2024-07-13 DIAGNOSIS — I700 Atherosclerosis of aorta: Secondary | ICD-10-CM | POA: Insufficient documentation

## 2024-07-13 DIAGNOSIS — K8020 Calculus of gallbladder without cholecystitis without obstruction: Secondary | ICD-10-CM | POA: Insufficient documentation

## 2024-07-13 DIAGNOSIS — K449 Diaphragmatic hernia without obstruction or gangrene: Secondary | ICD-10-CM | POA: Diagnosis not present

## 2024-07-13 DIAGNOSIS — D649 Anemia, unspecified: Secondary | ICD-10-CM | POA: Diagnosis not present

## 2024-07-13 DIAGNOSIS — J8410 Pulmonary fibrosis, unspecified: Secondary | ICD-10-CM | POA: Insufficient documentation

## 2024-07-13 DIAGNOSIS — I2510 Atherosclerotic heart disease of native coronary artery without angina pectoris: Secondary | ICD-10-CM | POA: Insufficient documentation

## 2024-07-13 DIAGNOSIS — J449 Chronic obstructive pulmonary disease, unspecified: Secondary | ICD-10-CM | POA: Insufficient documentation

## 2024-07-13 DIAGNOSIS — I252 Old myocardial infarction: Secondary | ICD-10-CM | POA: Diagnosis not present

## 2024-07-13 DIAGNOSIS — Z9221 Personal history of antineoplastic chemotherapy: Secondary | ICD-10-CM | POA: Insufficient documentation

## 2024-07-13 DIAGNOSIS — I5042 Chronic combined systolic (congestive) and diastolic (congestive) heart failure: Secondary | ICD-10-CM | POA: Insufficient documentation

## 2024-07-13 DIAGNOSIS — I4819 Other persistent atrial fibrillation: Secondary | ICD-10-CM | POA: Insufficient documentation

## 2024-07-13 DIAGNOSIS — Z923 Personal history of irradiation: Secondary | ICD-10-CM | POA: Insufficient documentation

## 2024-07-13 DIAGNOSIS — Z87442 Personal history of urinary calculi: Secondary | ICD-10-CM | POA: Insufficient documentation

## 2024-07-13 LAB — CBC WITH DIFFERENTIAL (CANCER CENTER ONLY)
Abs Immature Granulocytes: 0.02 K/uL (ref 0.00–0.07)
Basophils Absolute: 0 K/uL (ref 0.0–0.1)
Basophils Relative: 0 %
Eosinophils Absolute: 0 K/uL (ref 0.0–0.5)
Eosinophils Relative: 0 %
HCT: 27.6 % — ABNORMAL LOW (ref 39.0–52.0)
Hemoglobin: 9.4 g/dL — ABNORMAL LOW (ref 13.0–17.0)
Immature Granulocytes: 1 %
Lymphocytes Relative: 10 %
Lymphs Abs: 0.4 K/uL — ABNORMAL LOW (ref 0.7–4.0)
MCH: 35.9 pg — ABNORMAL HIGH (ref 26.0–34.0)
MCHC: 34.1 g/dL (ref 30.0–36.0)
MCV: 105.3 fL — ABNORMAL HIGH (ref 80.0–100.0)
Monocytes Absolute: 0.5 K/uL (ref 0.1–1.0)
Monocytes Relative: 10 %
Neutro Abs: 3.5 K/uL (ref 1.7–7.7)
Neutrophils Relative %: 79 %
Platelet Count: 50 K/uL — ABNORMAL LOW (ref 150–400)
RBC: 2.62 MIL/uL — ABNORMAL LOW (ref 4.22–5.81)
RDW: 19.1 % — ABNORMAL HIGH (ref 11.5–15.5)
WBC Count: 4.4 K/uL (ref 4.0–10.5)
nRBC: 0.5 % — ABNORMAL HIGH (ref 0.0–0.2)

## 2024-07-13 LAB — CMP (CANCER CENTER ONLY)
ALT: 16 U/L (ref 0–44)
AST: 21 U/L (ref 15–41)
Albumin: 3.7 g/dL (ref 3.5–5.0)
Alkaline Phosphatase: 100 U/L (ref 38–126)
Anion gap: 7 (ref 5–15)
BUN: 22 mg/dL (ref 8–23)
CO2: 26 mmol/L (ref 22–32)
Calcium: 9.2 mg/dL (ref 8.9–10.3)
Chloride: 109 mmol/L (ref 98–111)
Creatinine: 1.09 mg/dL (ref 0.61–1.24)
GFR, Estimated: >60 mL/min (ref >60)
Glucose, Bld: 147 mg/dL — ABNORMAL HIGH (ref 70–99)
Potassium: 4.1 mmol/L (ref 3.5–5.1)
Sodium: 142 mmol/L (ref 135–145)
Total Bilirubin: 0.6 mg/dL (ref 0.0–1.2)
Total Protein: 6.3 g/dL — ABNORMAL LOW (ref 6.5–8.1)

## 2024-07-13 LAB — SAMPLE TO BLOOD BANK

## 2024-07-13 MED ORDER — IOHEXOL 300 MG/ML  SOLN: 75.0000 mL | Freq: Once | INTRAMUSCULAR | Status: DC | PRN

## 2024-07-15 ENCOUNTER — Ambulatory Visit (HOSPITAL_COMMUNITY)

## 2024-07-15 ENCOUNTER — Encounter (HOSPITAL_COMMUNITY): Payer: Self-pay

## 2024-07-19 ENCOUNTER — Other Ambulatory Visit: Payer: Self-pay | Admitting: Medical Oncology

## 2024-07-19 ENCOUNTER — Encounter: Payer: Self-pay | Admitting: Internal Medicine

## 2024-07-19 DIAGNOSIS — C3490 Malignant neoplasm of unspecified part of unspecified bronchus or lung: Secondary | ICD-10-CM

## 2024-07-19 NOTE — Progress Notes (Unsigned)
 Ct chest not scheduled.

## 2024-07-20 ENCOUNTER — Inpatient Hospital Stay: Admitting: Internal Medicine

## 2024-07-20 ENCOUNTER — Telehealth: Payer: Self-pay | Admitting: Internal Medicine

## 2024-07-22 ENCOUNTER — Ambulatory Visit (HOSPITAL_COMMUNITY)
Admission: RE | Admit: 2024-07-22 | Discharge: 2024-07-22 | Disposition: A | Discharge status: Home / Self Care | Source: Ambulatory Visit | Attending: Internal Medicine | Admitting: Internal Medicine

## 2024-07-22 ENCOUNTER — Other Ambulatory Visit

## 2024-07-22 DIAGNOSIS — I700 Atherosclerosis of aorta: Secondary | ICD-10-CM | POA: Diagnosis not present

## 2024-07-22 DIAGNOSIS — I4819 Other persistent atrial fibrillation: Secondary | ICD-10-CM | POA: Diagnosis not present

## 2024-07-22 DIAGNOSIS — I10 Essential (primary) hypertension: Secondary | ICD-10-CM | POA: Diagnosis not present

## 2024-07-22 DIAGNOSIS — J449 Chronic obstructive pulmonary disease, unspecified: Secondary | ICD-10-CM | POA: Diagnosis not present

## 2024-07-22 DIAGNOSIS — C3490 Malignant neoplasm of unspecified part of unspecified bronchus or lung: Secondary | ICD-10-CM | POA: Insufficient documentation

## 2024-07-22 DIAGNOSIS — I2510 Atherosclerotic heart disease of native coronary artery without angina pectoris: Secondary | ICD-10-CM | POA: Diagnosis not present

## 2024-07-22 DIAGNOSIS — I252 Old myocardial infarction: Secondary | ICD-10-CM | POA: Diagnosis not present

## 2024-07-22 DIAGNOSIS — C3431 Malignant neoplasm of lower lobe, right bronchus or lung: Secondary | ICD-10-CM | POA: Diagnosis not present

## 2024-07-22 DIAGNOSIS — D649 Anemia, unspecified: Secondary | ICD-10-CM | POA: Diagnosis not present

## 2024-07-22 DIAGNOSIS — J8410 Pulmonary fibrosis, unspecified: Secondary | ICD-10-CM | POA: Diagnosis not present

## 2024-07-22 DIAGNOSIS — J90 Pleural effusion, not elsewhere classified: Secondary | ICD-10-CM | POA: Diagnosis not present

## 2024-07-22 LAB — CMP (CANCER CENTER ONLY)
ALT: 12 U/L (ref 0–44)
AST: 19 U/L (ref 15–41)
Albumin: 3.9 g/dL (ref 3.5–5.0)
Alkaline Phosphatase: 103 U/L (ref 38–126)
Anion gap: 6 (ref 5–15)
BUN: 18 mg/dL (ref 8–23)
CO2: 28 mmol/L (ref 22–32)
Calcium: 9 mg/dL (ref 8.9–10.3)
Chloride: 110 mmol/L (ref 98–111)
Creatinine: 1.04 mg/dL (ref 0.61–1.24)
GFR, Estimated: >60 mL/min (ref >60)
Glucose, Bld: 146 mg/dL — ABNORMAL HIGH (ref 70–99)
Potassium: 4.4 mmol/L (ref 3.5–5.1)
Sodium: 144 mmol/L (ref 135–145)
Total Bilirubin: 0.8 mg/dL (ref 0.0–1.2)
Total Protein: 6.6 g/dL (ref 6.5–8.1)

## 2024-07-22 LAB — CBC WITH DIFFERENTIAL (CANCER CENTER ONLY)
Abs Immature Granulocytes: 0.02 K/uL (ref 0.00–0.07)
Basophils Absolute: 0 K/uL (ref 0.0–0.1)
Basophils Relative: 0 %
Eosinophils Absolute: 0 K/uL (ref 0.0–0.5)
Eosinophils Relative: 0 %
HCT: 26 % — ABNORMAL LOW (ref 39.0–52.0)
Hemoglobin: 8.8 g/dL — ABNORMAL LOW (ref 13.0–17.0)
Immature Granulocytes: 1 %
Lymphocytes Relative: 11 %
Lymphs Abs: 0.4 K/uL — ABNORMAL LOW (ref 0.7–4.0)
MCH: 36.1 pg — ABNORMAL HIGH (ref 26.0–34.0)
MCHC: 33.8 g/dL (ref 30.0–36.0)
MCV: 106.6 fL — ABNORMAL HIGH (ref 80.0–100.0)
Monocytes Absolute: 0.3 K/uL (ref 0.1–1.0)
Monocytes Relative: 7 %
Neutro Abs: 3.3 K/uL (ref 1.7–7.7)
Neutrophils Relative %: 81 %
Platelet Count: 55 K/uL — ABNORMAL LOW (ref 150–400)
RBC: 2.44 MIL/uL — ABNORMAL LOW (ref 4.22–5.81)
RDW: 19 % — ABNORMAL HIGH (ref 11.5–15.5)
WBC Count: 4.1 K/uL (ref 4.0–10.5)
nRBC: 0 % (ref 0.0–0.2)

## 2024-07-22 MED ORDER — IOHEXOL 300 MG/ML  SOLN
75.0000 mL | Freq: Once | INTRAMUSCULAR | Status: AC | PRN
  Administered 2024-07-22: 75 mL via INTRAVENOUS

## 2024-07-28 ENCOUNTER — Encounter: Payer: Self-pay | Admitting: Radiation Oncology

## 2024-07-28 NOTE — Progress Notes (Signed)
 Radiation Oncology         [phone]) [phone] ________________________________  Name: Peter [name] MRN: [id number]  Date: 07/29/2024  DOB: Nov 22, 1945  Follow-Up Visit Note  CC: Peter Slater, MD  Koirala, [name], MD  No diagnosis found.  Diagnosis: Stage IIIA (T4, N1, M0) non-small cell lung cancer, adenocarcinoma presented with right lower lobe lung mass in addition to suspicious right upper lobe lesion and right hilar lymphadenopathy diagnosed in April 2025.   Interval Since Last Radiation:  1 month 3 days Intent: curative  First Treatment Date: 2024-05-11 Last Treatment Date: 2024-06-25   Plan Name: Lung_R_HYB Site: Lung, Right Technique: 3D Mode: Photon Dose Per Fraction: 2 Gy Prescribed Dose (Delivered / Prescribed): 60 Gy / 60 Gy Prescribed Fxs (Delivered / Prescribed): 30 / 30   Plan Name: Lung_R_Bst Site: Lung, Right Technique: 3D Mode: Photon Dose Per Fraction: 2 Gy Prescribed Dose (Delivered / Prescribed): 6 Gy / 6 Gy Prescribed Fxs (Delivered / Prescribed): 3 / 3    Narrative:  The patient returns today for routine follow-up. He was last seen in office on 05/03/24 for a FUN visit. Since then, patient completed his radiation treatment which he  tolerated quite well. Patient did however endorse experiencing anticipated skin reaction.   First dose of concurrent chemoradiation with carboplatin  for AUC of 2 and paclitaxel  45 mg/m was administered on 05/10/24. Last cycle of concurrent chemoradiation with carboplatin  and paclitaxel  was administered on 06/21/24  He presented to the ED on 06/25/24 complaining of a SOB and heart racing. CT angiogram chest negative for acute PE. 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  As a result, patient admitted for further management. He was then discharged on 06/26/24 as his vitals stabilized.    Most recent CT chest performed on 07/22/24 - results pending  No other significant oncologic interval history since the  patient was last seen.                                Allergies:  is allergic to prednisone  and spironolactone .  Meds: Current Outpatient Medications  Medication Sig Dispense Refill   albuterol  (VENTOLIN  HFA) 108 (90 Base) MCG/ACT inhaler Inhale 2 puffs into the lungs every 6 (six) hours as needed for wheezing or shortness of breath (intractable cough). (Patient not taking: Reported on 05/10/2024) 8 g 2   allopurinol  (ZYLOPRIM ) 100 MG tablet Take 100 mg by mouth at bedtime.     allopurinol  (ZYLOPRIM ) 300 MG tablet Take 300 mg by mouth in the morning.     atorvastatin  (LIPITOR) 40 MG tablet TAKE 1 TABLET EVERY DAY (NEED MD APPOINTMENT, CALL [phone]) 90 tablet 1   budeson-glycopyrrolate-formoterol (BREZTRI  AEROSPHERE) 160-9-4.8 MCG/ACT AERO Inhale 2 puffs into the lungs in the morning and at bedtime. (Patient not taking: No sig reported)     carvedilol  (COREG ) 25 MG tablet Take 1 tablet (25 mg total) by mouth 2 (two) times daily with a meal. 180 tablet 0   Cyanocobalamin  (VITAMIN B-12 PO) Take 1 tablet by mouth daily.     diphenhydramine -acetaminophen  (TYLENOL  PM) 25-500 MG TABS tablet Take 1 tablet by mouth at bedtime.     ferrous sulfate  325 (65 FE) MG tablet Take 1 tablet (325 mg total) by mouth every morning. 90 tablet 2   folic acid  (FOLVITE ) 1 MG tablet Take 1 mg by mouth daily.     furosemide  (LASIX ) 40 MG tablet Take 1 tablet (40  mg total) by mouth daily. 90 tablet 0   hydrocortisone  1 % lotion Apply 1 Application topically 2 (two) times daily. 118 mL 0   [Paused] lisinopril  (ZESTRIL ) 40 MG tablet TAKE 1 TABLET EVERY DAY (Patient not taking: Reported on 05/24/2024) 90 tablet 3   nitroGLYCERIN  (NITROSTAT ) 0.4 MG SL tablet Place 1 tablet (0.4 mg total) under the tongue every 5 (five) minutes as needed for chest pain. X 3 doses 25 tablet 5   pantoprazole  (PROTONIX ) 40 MG tablet Take 40 mg by mouth daily.     polyvinyl alcohol  (LIQUIFILM TEARS) 1.4 % ophthalmic solution Place 1 drop into  both eyes daily as needed for dry eyes.     rivaroxaban  (XARELTO ) 20 MG TABS tablet Take 1 tablet (20 mg total) by mouth at bedtime. Okay to restart this medication on 02/10/24 90 tablet 1   No current facility-administered medications for this visit.    Physical Findings: The patient is in no acute distress. Patient is alert and oriented.  vitals were not taken for this visit. .  No significant changes. Lungs are clear to auscultation bilaterally. Heart has regular rate and rhythm. No palpable cervical, supraclavicular, or axillary adenopathy. Abdomen soft, non-tender, normal bowel sounds.   Lab Findings: Lab Results  Component Value Date   WBC 4.1 07/22/2024   HGB 8.8 (L) 07/22/2024   HCT 26.0 (L) 07/22/2024   MCV 106.6 (H) 07/22/2024   PLT 55 (L) 07/22/2024    Radiographic Findings: No results found.  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  ***  Plan:  ***   *** minutes of total time was spent for this patient encounter, including preparation, face-to-face counseling with the patient and coordination of care, physical exam, and documentation of the encounter. ____________________________________  Lynwood CHARM Nasuti, PhD, MD  This document serves as a record of services personally performed by Lynwood Nasuti, MD. It was created on his behalf by Reymundo Cartwright, a trained medical scribe. The creation of this record is based on the scribe's personal observations and the provider's statements to them. This document has been checked and approved by the attending provider.

## 2024-07-29 ENCOUNTER — Ambulatory Visit
Admission: RE | Admit: 2024-07-29 | Discharge: 2024-07-29 | Disposition: A | Discharge status: Home / Self Care | Source: Ambulatory Visit | Attending: Radiation Oncology | Admitting: Radiation Oncology

## 2024-07-29 ENCOUNTER — Inpatient Hospital Stay: Admitting: Internal Medicine

## 2024-07-29 ENCOUNTER — Encounter: Payer: Self-pay | Admitting: Radiation Oncology

## 2024-07-29 ENCOUNTER — Telehealth: Payer: Self-pay

## 2024-07-29 ENCOUNTER — Telehealth: Payer: Self-pay | Admitting: Internal Medicine

## 2024-07-29 VITALS — BP 132/90 | HR 95 | Temp 97.3°F | Resp 17 | Ht 68.0 in | Wt 211.0 lb

## 2024-07-29 DIAGNOSIS — I10 Essential (primary) hypertension: Secondary | ICD-10-CM | POA: Diagnosis not present

## 2024-07-29 DIAGNOSIS — D649 Anemia, unspecified: Secondary | ICD-10-CM | POA: Diagnosis not present

## 2024-07-29 DIAGNOSIS — I700 Atherosclerosis of aorta: Secondary | ICD-10-CM | POA: Insufficient documentation

## 2024-07-29 DIAGNOSIS — Z923 Personal history of irradiation: Secondary | ICD-10-CM | POA: Insufficient documentation

## 2024-07-29 DIAGNOSIS — I2510 Atherosclerotic heart disease of native coronary artery without angina pectoris: Secondary | ICD-10-CM | POA: Insufficient documentation

## 2024-07-29 DIAGNOSIS — I517 Cardiomegaly: Secondary | ICD-10-CM | POA: Insufficient documentation

## 2024-07-29 DIAGNOSIS — D500 Iron deficiency anemia secondary to blood loss (chronic): Secondary | ICD-10-CM | POA: Diagnosis not present

## 2024-07-29 DIAGNOSIS — R918 Other nonspecific abnormal finding of lung field: Secondary | ICD-10-CM | POA: Insufficient documentation

## 2024-07-29 DIAGNOSIS — I252 Old myocardial infarction: Secondary | ICD-10-CM | POA: Diagnosis not present

## 2024-07-29 DIAGNOSIS — C3431 Malignant neoplasm of lower lobe, right bronchus or lung: Secondary | ICD-10-CM | POA: Diagnosis not present

## 2024-07-29 DIAGNOSIS — I471 Supraventricular tachycardia, unspecified: Secondary | ICD-10-CM | POA: Insufficient documentation

## 2024-07-29 DIAGNOSIS — K8020 Calculus of gallbladder without cholecystitis without obstruction: Secondary | ICD-10-CM | POA: Insufficient documentation

## 2024-07-29 DIAGNOSIS — J90 Pleural effusion, not elsewhere classified: Secondary | ICD-10-CM | POA: Insufficient documentation

## 2024-07-29 DIAGNOSIS — J449 Chronic obstructive pulmonary disease, unspecified: Secondary | ICD-10-CM | POA: Diagnosis not present

## 2024-07-29 DIAGNOSIS — J8410 Pulmonary fibrosis, unspecified: Secondary | ICD-10-CM | POA: Diagnosis not present

## 2024-07-29 DIAGNOSIS — I4819 Other persistent atrial fibrillation: Secondary | ICD-10-CM | POA: Diagnosis not present

## 2024-07-29 DIAGNOSIS — K449 Diaphragmatic hernia without obstruction or gangrene: Secondary | ICD-10-CM | POA: Insufficient documentation

## 2024-07-29 DIAGNOSIS — Z79899 Other long term (current) drug therapy: Secondary | ICD-10-CM | POA: Insufficient documentation

## 2024-07-29 HISTORY — DX: Personal history of irradiation: Z92.3

## 2024-07-29 NOTE — Telephone Encounter (Signed)
 Scheduled patient appointments for 3 weeks. Called and left voicemail for patient with appointment details.

## 2024-07-29 NOTE — Telephone Encounter (Signed)
 Spoke with patient who has decided to proceed with immunotherapy. Informed him that I would notify Dr. Sherrod of his decision. Patient voiced understanding.

## 2024-07-29 NOTE — Patient Instructions (Signed)
 VISIT SUMMARY:  Today, we reviewed your current health status, focusing on your stage III non-small cell lung cancer, breathing difficulties, anemia, and COPD. We discussed the results of your recent CT scan and potential treatment options moving forward.  YOUR PLAN:  -STAGE III NON-SMALL CELL LUNG CANCER: Your lung cancer has shown some shrinkage after chemoradiation, but there has been no significant improvement on the recent CT scan. We discussed starting immunotherapy with durvalumab (Imfinzi) for one year, which can help your immune system fight the cancer. Potential side effects include inflammation, rash, and organ issues, but severe side effects are rare. You will decide on this treatment within six weeks, and we will continue to monitor your condition with regular CT scans.  -RIGHT PLEURAL EFFUSION: You have a small amount of fluid around your right lung, which is contributing to your shortness of breath. Currently, it is not large enough to require drainage, but we will keep an eye on it and consider drainage if it increases.  -ANEMIA AND THROMBOCYTOPENIA: Your blood tests show low hemoglobin and platelet levels, which have not improved since your last treatment. This could be due to a problem with your bone marrow. We will perform a bone marrow biopsy to investigate further and provide you with a stool card to check for hidden blood in your stool.  -CHRONIC OBSTRUCTIVE PULMONARY DISEASE (COPD): Your COPD is contributing to your breathing difficulties. While you have noticed some improvement in taking deep breaths, you still experience lightheadedness and fatigue. We will continue to monitor and manage your symptoms.  INSTRUCTIONS:  1. Review the information on durvalumab (Imfinzi) and decide on immunotherapy within six weeks post-radiation. 2. Undergo a bone marrow biopsy to investigate the cause of your anemia and thrombocytopenia. 3. Use the provided stool card to check for hidden  blood in your stool. 4. Continue to monitor your symptoms and report any changes or concerns.

## 2024-07-29 NOTE — Progress Notes (Signed)
 [facility] Telephone:([phone]) [phone]   Fax:(336) [phone]  OFFICE PROGRESS NOTE  Koirala, [name], MD [address] [place] [address]  DIAGNOSIS: Stage IIIA (T4, N1, M0) non-small cell lung cancer, adenocarcinoma presented with right lower lobe lung mass in addition to suspicious right upper lobe lesion and right hilar lymphadenopathy diagnosed in April 2025.   Biomarker Findings HRD signature - Cannot Be Determined Microsatellite status - Cannot Be Determined ? Tumor Mutational Burden - Cannot Be Determined Genomic Findings For a complete list of the genes assayed, please refer to the Appendix. AKT2 amplification CDKN2A p16INK4a R80* and p14ARF P94L KRAS G12D ASXL1 W796fs*22 GNAS R201H U2AF1 S34F 7 Disease relevant genes with no reportable alterations: ALK, BRAF, EGFR, ERBB2, MET, RET, ROS1  PDL1 TPS: 0%  PRIOR THERAPY:  1) Neoadjuvant systemic chemotherapy with carboplatin  for AUC of 5, Alimta  500 Mg/M2 and Keytruda  200 Mg IV every 3 weeks.  Status post 1 cycle.  This was discontinued secondary to intolerance. 2) A course of concurrent chemoradiation with weekly carboplatin  for AUC of 2 and paclitaxel  45 Mg/M2.  First dose dose 32,025.  Status post 7 cycles.  Last dose was given 06/21/2024.  CURRENT THERAPY: Consideration of consolidation treatment with immunotherapy with durvalumab 1500 mg IV every 4 weeks.  INTERVAL HISTORY: Peter [name] 78 y.o. male returns to the clinic today for follow-up visit accompanied by his wife and son. Discussed the use of AI scribe software for clinical note transcription with the patient, who gave verbal consent to proceed.  History of Present Illness Peter [name] is a 78 year old male with stage III non-small cell lung cancer who presents for evaluation and restaging with a repeat CT scan of the chest. He is accompanied by his wife and son.  He was diagnosed with stage III non-small cell lung  cancer, adenocarcinoma, in April 2025. Initially, he underwent one cycle of neoadjuvant systemic chemoimmunotherapy with carboplatin  and LM, which was discontinued due to intolerance. Subsequently, he completed a course of concurrent chemoradiation with weekly carboplatin  and paclitaxel , totaling seven cycles.  He experiences variable breathing difficulties, noting improvement in his ability to take deep breaths since Monday night. However, he has significant shortness of breath after activities such as showering, requiring him to rest for ten minutes to recover. He also feels lightheaded when getting up from a chair, necessitating support from the counter to stabilize himself.  He has a history of COPD and anemia. His hemoglobin is currently 8.8, and his platelets are at 55,000, which have not recovered since his last treatment. No bleeding, including blood in stool, epistaxis, gum bleeding, or hematuria.  He spends a significant amount of time in bed, approximately 20 hours a day, due to fatigue and low energy levels. He typically eats only one meal a day.    MEDICAL HISTORY: Past Medical History:  Diagnosis Date   Arthritis    At risk for sleep apnea    STOP-BANG= 5       SENT TO PCP 07-12-2015   CAD (coronary artery disease)    a. s/p PCI with DES x 3 to LAD in 2006 with bifurcation lesion and Plavix  indefinitely was recommended.   Chronic combined systolic and diastolic CHF (congestive heart failure) (HCC)    a. Prior EF normal but EF dropped to 30-35% in 07/2016 in setting of AFIB.   CKD (chronic kidney disease), stage III (HCC)    Depression  Dyslipidemia    Dyspnea    tired quick d/t Afib   Dyspnea on exertion    Dysrhythmia    A.FIB   GERD (gastroesophageal reflux disease)    History of GI bleed    History of kidney stones    History of MI (myocardial infarction)    11-05-2005--  periprocedural MI  (cardiac cath) per dr obie note   History of radiation therapy    Right  lung-05/11/24-06/25/24- Dr. Lynwood Nasuti   Hypertension    Hypertriglyceridemia    Iron deficiency anemia    Myocardial infarction [facility])    Persistent atrial fibrillation (HCC)    a. 07/2016, started on Eliquis  -> readmitted later that month for AF RVR/acute HF - initially unsuccessful DCCV following TEE, so loaded with amio with repeat successful DCCV 07/2816.   Right ureteral stone    S/P drug eluting coronary stent placement    x3  to LAD  2006   Sleep apnea    wears BIPAP, 14 up to 24    ALLERGIES:  is allergic to prednisone  and spironolactone .  MEDICATIONS:  Current Outpatient Medications  Medication Sig Dispense Refill   allopurinol  (ZYLOPRIM ) 100 MG tablet Take 100 mg by mouth at bedtime.     allopurinol  (ZYLOPRIM ) 300 MG tablet Take 300 mg by mouth in the morning.     atorvastatin  (LIPITOR) 40 MG tablet TAKE 1 TABLET EVERY DAY (NEED MD APPOINTMENT, CALL [phone]) 90 tablet 1   carvedilol  (COREG ) 25 MG tablet Take 1 tablet (25 mg total) by mouth 2 (two) times daily with a meal. 180 tablet 0   Cyanocobalamin  (VITAMIN B-12 PO) Take 1 tablet by mouth daily.     diphenhydramine -acetaminophen  (TYLENOL  PM) 25-500 MG TABS tablet Take 1 tablet by mouth at bedtime.     ferrous sulfate  325 (65 FE) MG tablet Take 1 tablet (325 mg total) by mouth every morning. 90 tablet 2   folic acid  (FOLVITE ) 1 MG tablet Take 1 mg by mouth daily.     furosemide  (LASIX ) 40 MG tablet Take 1 tablet (40 mg total) by mouth daily. 90 tablet 0   hydrocortisone  1 % lotion Apply 1 Application topically 2 (two) times daily. 118 mL 0   [Paused] lisinopril  (ZESTRIL ) 40 MG tablet TAKE 1 TABLET EVERY DAY 90 tablet 3   nitroGLYCERIN  (NITROSTAT ) 0.4 MG SL tablet Place 1 tablet (0.4 mg total) under the tongue every 5 (five) minutes as needed for chest pain. X 3 doses 25 tablet 5   pantoprazole  (PROTONIX ) 40 MG tablet Take 40 mg by mouth daily.     polyvinyl alcohol  (LIQUIFILM TEARS) 1.4 % ophthalmic solution Place 1  drop into both eyes daily as needed for dry eyes.     rivaroxaban  (XARELTO ) 20 MG TABS tablet Take 1 tablet (20 mg total) by mouth at bedtime. Okay to restart this medication on 02/10/24 90 tablet 1   albuterol  (VENTOLIN  HFA) 108 (90 Base) MCG/ACT inhaler Inhale 2 puffs into the lungs every 6 (six) hours as needed for wheezing or shortness of breath (intractable cough). (Patient not taking: Reported on 07/29/2024) 8 g 2   budeson-glycopyrrolate-formoterol (BREZTRI  AEROSPHERE) 160-9-4.8 MCG/ACT AERO Inhale 2 puffs into the lungs in the morning and at bedtime. (Patient not taking: Reported on 07/29/2024)     No current facility-administered medications for this visit.    SURGICAL HISTORY:  Past Surgical History:  Procedure Laterality Date   BRONCHIAL BIOPSY  02/09/2024   Procedure: BRONCHOSCOPY, WITH BIOPSY;  Surgeon: Shelah Lamar RAMAN, MD;  Location: [facility] ENDOSCOPY;  Service: Pulmonary;;   BRONCHIAL BRUSHINGS  02/09/2024   Procedure: BRONCHOSCOPY, WITH BRUSH BIOPSY;  Surgeon: Shelah Lamar RAMAN, MD;  Location: [facility] ENDOSCOPY;  Service: Pulmonary;;   BRONCHIAL NEEDLE ASPIRATION BIOPSY  02/09/2024   Procedure: BRONCHOSCOPY, WITH NEEDLE ASPIRATION BIOPSY;  Surgeon: Shelah Lamar RAMAN, MD;  Location: [facility] ENDOSCOPY;  Service: Pulmonary;;   BRONCHIAL WASHINGS  02/09/2024   Procedure: IRRIGATION, BRONCHUS;  Surgeon: Shelah Lamar RAMAN, MD;  Location: [facility] ENDOSCOPY;  Service: Pulmonary;;   CARDIOVASCULAR STRESS TEST  01-06-2013  dr micky   normal perfusion study/  no ischemia or infarct/  normal LV function and wall motion , ef 57%   CARDIOVERSION N/A 08/05/2016   Procedure: CARDIOVERSION;  Surgeon: Jerel Balding, MD;  Location: [facility] ENDOSCOPY;  Service: Cardiovascular;  Laterality: N/A;   CARDIOVERSION N/A 08/08/2016   Procedure: CARDIOVERSION;  Surgeon: Oneil JAYSON Parchment, MD;  Location: [facility] ENDOSCOPY;  Service: Cardiovascular;  Laterality: N/A;   CORONARY ANGIOPLASTY  11-13-2005 dr obie   Successful touch up post-dilatation of  the 3 tandem overlying Cypher stents in proximal to mid LAD (based on IVUS 0% stenosis and patent diagonal branch)/  Cutting Balloon Angioplasty of Ramus branch   CORONARY ANGIOPLASTY WITH STENT PLACEMENT  11-05-2005   dr wolm brodie   PCI and DES x3 to birfurcation lesion LAD and diagonal branch of LAD/   20% dLM,  30-40% RCA, ef 60%   CYSTOSCOPY WITH RETROGRADE PYELOGRAM, URETEROSCOPY AND STENT PLACEMENT Right 07/14/2015   Procedure: CYSTOSCOPY WITH RETROGRADE PYELOGRAM, URETEROSCOPY AND STENT PLACEMENT;  Surgeon: Ricardo Likens, MD;  Location: [facility];  Service: Urology;  Laterality: Right;   ELECTROPHYSIOLOGIC STUDY N/A 10/24/2016   Procedure: Atrial Fibrillation Ablation;  Surgeon: Will Gladis Norton, MD;  Location: [facility] INVASIVE CV LAB;  Service: Cardiovascular;  Laterality: N/A;   EXTRACORPOREAL SHOCK WAVE LITHOTRIPSY  yrs ago   HEMORRHOID SURGERY N/A 10/24/2017   Procedure: HEMORRHOIDECTOMY;  Surgeon: Teresa Lonni HERO, MD;  Location: [facility] OR;  Service: General;  Laterality: N/A;   HERNIA REPAIR Bilateral 2000   Inguinal Hernia   IR GENERIC HISTORICAL  10/23/2016   IR FLUORO GUIDE CV LINE RIGHT 10/23/2016 Ozell Specking, MD [facility]-INTERV RAD   IR GENERIC HISTORICAL  10/23/2016   IR US  GUIDE VASC ACCESS RIGHT 10/23/2016 Ozell Specking, MD [facility]   LEFT URETEROSCOPIC STONE EXTRACTION  04/10/2006   STONE EXTRACTION WITH BASKET Right 07/14/2015   Procedure: STONE EXTRACTION WITH BASKET;  Surgeon: Ricardo Likens, MD;  Location: [facility];  Service: Urology;  Laterality: Right;   TEE WITHOUT CARDIOVERSION N/A 08/05/2016   Procedure: TRANSESOPHAGEAL ECHOCARDIOGRAM (TEE);  Surgeon: Jerel Balding, MD;  Location: [facility] ENDOSCOPY;  Service: Cardiovascular;  Laterality: N/A;   TOTAL HIP ARTHROPLASTY Right 07/16/2022   Procedure: RIGHT TOTAL HIP ARTHROPLASTY ANTERIOR APPROACH;  Surgeon: Jerri Kay HERO, MD;  Location: [facility] OR;  Service: Orthopedics;  Laterality: Right;   3-C   TRANSTHORACIC ECHOCARDIOGRAM  02/04/2012   grade I diastolic dysfunction/  ef 55-60%/  trivial MR and TR/  mild LAE   VIDEO BRONCHOSCOPY WITH ENDOBRONCHIAL NAVIGATION Right 02/09/2024   Procedure: VIDEO BRONCHOSCOPY WITH ENDOBRONCHIAL NAVIGATION;  Surgeon: Shelah Lamar RAMAN, MD;  Location: [facility] ENDOSCOPY;  Service: Pulmonary;  Laterality: Right;  WITH FLOURO    REVIEW OF SYSTEMS:  Constitutional: positive for anorexia and fatigue Eyes: negative Ears, nose, mouth, throat, and face: negative Respiratory: positive for cough and dyspnea on exertion  Cardiovascular: negative Gastrointestinal: negative Genitourinary:negative Integument/breast: negative Hematologic/lymphatic: negative Musculoskeletal:positive for muscle weakness Neurological: negative Behavioral/Psych: negative Endocrine: negative Allergic/Immunologic: negative   PHYSICAL EXAMINATION: General appearance: alert, cooperative, fatigued, and no distress Head: Normocephalic, without obvious abnormality, atraumatic Neck: no adenopathy, no JVD, supple, symmetrical, trachea midline, and thyroid  not enlarged, symmetric, no tenderness/mass/nodules Lymph nodes: Cervical, supraclavicular, and axillary nodes normal. Resp: clear to auscultation bilaterally Back: symmetric, no curvature. ROM normal. No CVA tenderness. Cardio: regular rate and rhythm, S1, S2 normal, no murmur, click, rub or gallop GI: soft, non-tender; bowel sounds normal; no masses,  no organomegaly Extremities: extremities normal, atraumatic, no cyanosis or edema Neurologic: Alert and oriented X 3, normal strength and tone. Normal symmetric reflexes. Normal coordination and gait  ECOG PERFORMANCE STATUS: 1 - Symptomatic but completely ambulatory  Pulse 95, temperature (!) 97.3 F (36.3 C), resp. rate 17, height 5' 8 (1.727 m), weight 211 lb (95.7 kg), SpO2 100%.  LABORATORY DATA: Lab Results  Component Value Date   WBC 4.1 07/22/2024   HGB 8.8 (L) 07/22/2024    HCT 26.0 (L) 07/22/2024   MCV 106.6 (H) 07/22/2024   PLT 55 (L) 07/22/2024      Chemistry      Component Value Date/Time   NA 144 07/22/2024 1212   K 4.4 07/22/2024 1212   CL 110 07/22/2024 1212   CO2 28 07/22/2024 1212   BUN 18 07/22/2024 1212   CREATININE 1.04 07/22/2024 1212   CREATININE 1.72 (H) 10/16/2016 1115      Component Value Date/Time   CALCIUM  9.0 07/22/2024 1212   ALKPHOS 103 07/22/2024 1212   AST 19 07/22/2024 1212   ALT 12 07/22/2024 1212   BILITOT 0.8 07/22/2024 1212       RADIOGRAPHIC STUDIES: CT Chest W Contrast Result Date: 07/29/2024 CLINICAL DATA:  Non-small-cell lung cancer restaging * Tracking Code: BO * EXAM: CT CHEST WITH CONTRAST TECHNIQUE: Multidetector CT imaging of the chest was performed during intravenous contrast administration. RADIATION DOSE REDUCTION: This exam was performed according to the departmental dose-optimization program which includes automated exposure control, adjustment of the mA and/or kV according to patient size and/or use of iterative reconstruction technique. CONTRAST:  75mL OMNIPAQUE  IOHEXOL  300 MG/ML  SOLN COMPARISON:  CT chest, 06/25/2024 PET-CT, 02/23/2024 FINDINGS: Cardiovascular: Aortic atherosclerosis. Cardiomegaly. Three-vessel coronary artery calcifications. No pericardial effusion. Mediastinum/Nodes: Slightly diminished size of right paratracheal lymph nodes, measuring up to 1.5 x 1.0 cm, previously 1.6 x 1.3 cm (series 2, image 36). Moderate hiatal hernia with intrathoracic position of the gastric fundus. Thyroid  gland, trachea, and esophagus demonstrate no significant findings. Lungs/Pleura: No significant change in post treatment appearance of the chest, with dense, masslike and partially cavitary consolidation of the right upper lobe measuring 7.1 x 3.6 cm (series 8, image 38) and in the superior segment right lower lobe measuring 3.5 x 3.2 cm (series 8, image 54). Although both of these lesions were FDG avid on prior  PET-CT the right lower lobe lesion was with a greater degree of avidity. Small right, trace left pleural effusions. Underlying mild bibasilar predominance pulmonary fibrosis. Upper Abdomen: No acute abnormality.  Small gallstone. Musculoskeletal: No chest wall abnormality. No acute osseous findings. IMPRESSION: 1. No significant change in post treatment appearance of the chest, with dense, masslike and partially cavitary consolidation of the right upper lobe and superior segment right lower lobe. Although both of these lesions were FDG avid on prior PET-CT the right lower lobe lesion was with a greater degree of  avidity. 2. Slightly diminished size of right paratracheal lymph nodes. 3. Small right, trace left pleural effusions. 4. Underlying mild bibasilar predominant pulmonary fibrosis. 5. Cardiomegaly and coronary artery disease. 6. Moderate hiatal hernia with intrathoracic position of the gastric fundus. 7. Cholelithiasis. Aortic Atherosclerosis (ICD10-I70.0). Electronically Signed   By: Marolyn JONETTA Jaksch M.D.   On: 07/29/2024 07:16     ASSESSMENT AND PLAN: This is a very pleasant 78 years old white male with Stage IIIA (T4, N1, M0) non-small cell lung cancer, adenocarcinoma presented with right lower lobe lung mass in addition to suspicious right upper lobe lesion and right hilar lymphadenopathy diagnosed in April 2025.  Molecular studies showed no actionable mutations and PD-L1 expression was negative. The patient is currently undergoing neoadjuvant chemoimmunotherapy with carboplatin  AUC of 5, Alimta  500 Mg/M2 and Keytruda  200 Mg IV every 3 weeks status post 1 cycle.  He tolerated the first cycle of his treatment well except for mild fatigue and mild skin rash on the face. The patient has intolerance to the neoadjuvant chemoimmunotherapy and this was discontinued. He underwent a course of concurrent chemoradiation with weekly carboplatin  for AUC of 2 and paclitaxel  45 Mg/M2.  First dose expected May 10, 2024.  Status post 7 cycles.  He had repeat CT scan of the chest performed recently.  I personally and independently reviewed the scan images and discussed the result with the patient and his family.  His scan showed slight improvement of his disease. Assessment and Plan Assessment & Plan Stage III non-small cell lung cancer, right lung Stage III non-small cell lung cancer in the right lung with some shrinkage of the main mass post concurrent chemoradiation. No significant improvement on recent CT scan. Previous intolerance to neoadjuvant systemic chemoimmunotherapy. Current standard of care is one year of immunotherapy with durvalumab (Imfinzi) post-chemoradiation. Discussed potential side effects of immunotherapy, including inflammation of any organ, rash, lung inflammation, kidney and liver issues, and diarrhea. Emphasized the small percentage of severe side effects, with higher grade side effects occurring in 2-4% of cases. He agreed to immunotherapy. - Provide information on durvalumab (Imfinzi) for him to review. - He will decide on immunotherapy within six weeks post-radiation. - Monitor with regular CT scans regardless of treatment decision.  Right pleural effusion Small right pleural effusion on CT scan, contributing to shortness of breath. Not currently large enough to warrant drainage. - Monitor size of pleural effusion and consider drainage if it increases.  Anemia and thrombocytopenia of unclear etiology Persistent anemia with hemoglobin at 8.8 and thrombocytopenia with platelets at 55,000, not recovering post-treatment. No signs of bleeding. Differential includes bone marrow pathology. - Order bone marrow biopsy to investigate cause of anemia and thrombocytopenia. - Provide stool card to check for occult blood in stool.  Chronic obstructive pulmonary disease (COPD) COPD contributing to shortness of breath. Recent improvement in ability to take deep breaths, but still experiencing  lightheadedness and fatigue. He was advised to call immediately if he has any concerning symptoms in the interval.  The patient voices understanding of current disease status and treatment options and is in agreement with the current care plan.  All questions were answered. The patient knows to call the clinic with any problems, questions or concerns. We can certainly see the patient much sooner if necessary.  The total time spent in the appointment was 55 minutes.  Disclaimer: This note was dictated with voice recognition software. Similar sounding words can inadvertently be transcribed and may not be corrected upon review.

## 2024-07-29 NOTE — Progress Notes (Signed)
 Peter [name] is here today for follow up post radiation to the lung.  Lung Side: Right Lung  Does the patient complain of any of the following: Pain:Denies Shortness of breath w/wo exertion: Yes, moderately. Cough: Yes, he reports taking a cough suppressant.  Hemoptysis: Denies Pain with swallowing: Denies Swallowing/choking concerns: Denies Appetite: He reports eating one meal a day. Energy Level: Low  Post radiation skin Changes: Skin has improved to back.    Additional comments if applicable:None  BP (!) 132/90 (BP Location: Left Arm, Patient Position: Sitting, Cuff Size: Large)   Pulse 95   Temp (!) 97.3 F (36.3 C)   Resp 17   Ht 5' 8 (1.727 m)   Wt 211 lb (95.7 kg)   SpO2 100%   BMI 32.08 kg/m

## 2024-07-30 ENCOUNTER — Other Ambulatory Visit: Payer: Self-pay | Admitting: Internal Medicine

## 2024-07-30 DIAGNOSIS — C3431 Malignant neoplasm of lower lobe, right bronchus or lung: Secondary | ICD-10-CM

## 2024-07-30 NOTE — Progress Notes (Signed)
 DISCONTINUE ON PATHWAY REGIMEN - Non-Small Cell Lung     A cycle is every 7 days, concurrent with RT:     Paclitaxel       Carboplatin    **Always confirm dose/schedule in your pharmacy ordering system**  REASON: Continuation Of Treatment PRIOR TREATMENT: [id number]: Carboplatin  AUC=2 + Paclitaxel  45 mg/m2 Weekly During Radiation TREATMENT RESPONSE: Stable Disease (SD)  START ON PATHWAY REGIMEN - Non-Small Cell Lung     A cycle is every 28 days:     Durvalumab   **Always confirm dose/schedule in your pharmacy ordering system**  Patient Characteristics: Preoperative or Nonsurgical Candidate (Clinical Staging), Stage IIB (N2a only) or Stage III - Nonsurgical Candidate, PS = 0,1 Therapeutic Status: Preoperative or Nonsurgical Candidate (Clinical Staging) AJCC T Category: cT4 AJCC N Category: cN1 AJCC M Category: cM0 AJCC 9 Stage Grouping: IIIA Check here if patient was staged using an edition other than AJCC Staging 9th Edition: true ECOG Performance Status: 1 Intent of Therapy: Curative Intent, Discussed with Patient

## 2024-08-02 ENCOUNTER — Encounter: Payer: Self-pay | Admitting: Internal Medicine

## 2024-08-02 ENCOUNTER — Telehealth: Payer: Self-pay

## 2024-08-02 ENCOUNTER — Other Ambulatory Visit: Payer: Self-pay | Admitting: Radiology

## 2024-08-02 DIAGNOSIS — D696 Thrombocytopenia, unspecified: Secondary | ICD-10-CM

## 2024-08-02 NOTE — Telephone Encounter (Signed)
 Attempted to contact patient regarding insurance denial of treatment. Informed patient that our team has reached out to the PA team, and currently the insurance status is pending. Requested a return call.

## 2024-08-02 NOTE — Telephone Encounter (Signed)
 Patient called to report that [facility] has denied coverage for his oncology treatment. He was informed that an appeal can be made. I reassured the patient that our team has already contacted the PA team for additional information and will be in touch with him as soon as we receive an update. The patient voiced understanding.

## 2024-08-03 ENCOUNTER — Encounter: Payer: Self-pay | Admitting: Physician Assistant

## 2024-08-03 ENCOUNTER — Encounter: Payer: Self-pay | Admitting: Internal Medicine

## 2024-08-04 ENCOUNTER — Other Ambulatory Visit: Payer: Self-pay

## 2024-08-05 ENCOUNTER — Other Ambulatory Visit: Payer: Self-pay | Admitting: Radiology

## 2024-08-05 NOTE — H&P (Signed)
 Chief Complaint: Persistent anemia and thrombocytopenia; referred for image guided bone marrow biopsy for further evaluation  Referring Provider(s): Mohamed,M  Supervising Physician: Jenna Hacker  Patient Status: [name] - Out-pt  History of Present Illness: [name] is a 78 y.o. 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  He is scheduled today for image guided bone marrow biopsy for further evaluation.  *** Patient is Full Code  Past Medical History:  Diagnosis Date   Arthritis    At risk for sleep apnea    STOP-BANG= 5       SENT TO PCP 07-12-2015   CAD (coronary artery disease)    a. s/p PCI with DES x 3 to LAD in 2006 with bifurcation lesion and Plavix  indefinitely was recommended.   Chronic combined systolic and diastolic CHF (congestive heart failure) (HCC)    a. Prior EF normal but EF dropped to 30-35% in 07/2016 in setting of AFIB.   CKD (chronic kidney disease), stage III (HCC)    Depression    Dyslipidemia    Dyspnea    tired quick d/t Afib   Dyspnea on exertion    Dysrhythmia    A.FIB   GERD (gastroesophageal reflux disease)    History of GI bleed    History of kidney stones    History of MI (myocardial infarction)    11-05-2005--  periprocedural MI  (cardiac cath) per dr obie note   History of radiation therapy    Right lung-05/11/24-06/25/24- Dr. Lynwood Nasuti   Hypertension    Hypertriglyceridemia    Iron deficiency anemia    Myocardial infarction [facility])    Persistent atrial fibrillation (HCC)    a. 07/2016, started on Eliquis  -> readmitted later that month for AF RVR/acute HF - initially unsuccessful  DCCV following TEE, so loaded with amio with repeat successful DCCV 07/2816.   Right ureteral stone    S/P drug eluting coronary stent placement    x3  to LAD  2006   Sleep apnea    wears BIPAP, 14 up to 24    Past Surgical History:  Procedure Laterality Date   BRONCHIAL BIOPSY  02/09/2024   Procedure: BRONCHOSCOPY, WITH BIOPSY;  Surgeon: Shelah Lamar RAMAN, MD;  Location: [facility] ENDOSCOPY;  Service: Pulmonary;;   BRONCHIAL BRUSHINGS  02/09/2024   Procedure: BRONCHOSCOPY, WITH BRUSH BIOPSY;  Surgeon: Shelah Lamar RAMAN, MD;  Location: [facility] ENDOSCOPY;  Service: Pulmonary;;   BRONCHIAL NEEDLE ASPIRATION BIOPSY  02/09/2024   Procedure: BRONCHOSCOPY, WITH NEEDLE ASPIRATION BIOPSY;  Surgeon: Shelah Lamar RAMAN, MD;  Location: [name] ENDOSCOPY;  Service: Pulmonary;;   BRONCHIAL WASHINGS  02/09/2024   Procedure: IRRIGATION, BRONCHUS;  Surgeon: Shelah Lamar RAMAN, MD;  Location: [facility] ENDOSCOPY;  Service: Pulmonary;;   CARDIOVASCULAR STRESS TEST  01-06-2013  dr micky   normal perfusion study/  no ischemia or infarct/  normal LV function and wall motion , ef 57%   CARDIOVERSION N/A 08/05/2016   Procedure: CARDIOVERSION;  Surgeon: Jerel Balding, MD;  Location: [facility] ENDOSCOPY;  Service: Cardiovascular;  Laterality: N/A;   CARDIOVERSION N/A 08/08/2016   Procedure: CARDIOVERSION;  Surgeon: Oneil JAYSON Parchment, MD;  Location: [facility] ENDOSCOPY;  Service: Cardiovascular;  Laterality: N/A;   CORONARY ANGIOPLASTY  11-13-2005 dr brodie  Successful touch up post-dilatation of the 3 tandem overlying Cypher stents in proximal to mid LAD (based on IVUS 0% stenosis and patent diagonal branch)/  Cutting Balloon Angioplasty of Ramus branch   CORONARY ANGIOPLASTY WITH STENT PLACEMENT  11-05-2005   dr wolm brodie   PCI and DES x3 to birfurcation lesion LAD and diagonal branch of LAD/   20% dLM,  30-40% RCA, ef 60%   CYSTOSCOPY WITH RETROGRADE PYELOGRAM, URETEROSCOPY AND STENT PLACEMENT Right 07/14/2015   Procedure: CYSTOSCOPY WITH RETROGRADE PYELOGRAM,  URETEROSCOPY AND STENT PLACEMENT;  Surgeon: Ricardo Likens, MD;  Location: [name];  Service: Urology;  Laterality: Right;   ELECTROPHYSIOLOGIC STUDY N/A 10/24/2016   Procedure: Atrial Fibrillation Ablation;  Surgeon: Will Gladis Norton, MD;  Location: [facility] INVASIVE CV LAB;  Service: Cardiovascular;  Laterality: N/A;   EXTRACORPOREAL SHOCK WAVE LITHOTRIPSY  yrs ago   HEMORRHOID SURGERY N/A 10/24/2017   Procedure: HEMORRHOIDECTOMY;  Surgeon: Teresa Lonni HERO, MD;  Location: [facility] OR;  Service: General;  Laterality: N/A;   HERNIA REPAIR Bilateral 2000   Inguinal Hernia   IR GENERIC HISTORICAL  10/23/2016   IR FLUORO GUIDE CV LINE RIGHT 10/23/2016 Ozell Specking, MD [facility]-INTER[facility] RAD   IR GENERIC HISTORICAL  10/23/2016   IR US  GUIDE VASC ACCESS RIGHT 10/23/2016 Ozell Specking, MD [facility]   LEFT URETEROSCOPIC STONE EXTRACTION  04/10/2006   STONE EXTRACTION WITH BASKET Right 07/14/2015   Procedure: STONE EXTRACTION WITH BASKET;  Surgeon: Ricardo Likens, MD;  Location: [facility];  Service: Urology;  Laterality: Right;   TEE WITHOUT CARDIOVERSION N/A 08/05/2016   Procedure: TRANSESOPHAGEAL ECHOCARDIOGRAM (TEE);  Surgeon: Jerel Balding, MD;  Location: [facility] ENDOSCOPY;  Service: Cardiovascular;  Laterality: N/A;   TOTAL HIP ARTHROPLASTY Right 07/16/2022   Procedure: RIGHT TOTAL HIP ARTHROPLASTY ANTERIOR APPROACH;  Surgeon: Jerri Kay HERO, MD;  Location: [facility] OR;  Service: Orthopedics;  Laterality: Right;  3-C   TRANSTHORACIC ECHOCARDIOGRAM  02/04/2012   grade I diastolic dysfunction/  ef 55-60%/  trivial MR and TR/  mild LAE   VIDEO BRONCHOSCOPY WITH ENDOBRONCHIAL NAVIGATION Right 02/09/2024   Procedure: VIDEO BRONCHOSCOPY WITH ENDOBRONCHIAL NAVIGATION;  Surgeon: Shelah Lamar RAMAN, MD;  Location: [name] ENDOSCOPY;  Service: Pulmonary;  Laterality: Right;  WITH FLOURO    Allergies: Prednisone  and Spironolactone   Medications: Prior to Admission medications   Medication Sig  Start Date End Date Taking? Authorizing Provider  albuterol  (VENTOLIN  HFA) 108 (90 Base) MCG/ACT inhaler Inhale 2 puffs into the lungs every 6 (six) hours as needed for wheezing or shortness of breath (intractable cough). Patient not taking: Reported on 07/29/2024 12/25/23   Raenelle Coria, MD  allopurinol  (ZYLOPRIM ) 100 MG tablet Take 100 mg by mouth at bedtime. 11/19/21   [provider]  allopurinol  (ZYLOPRIM ) 300 MG tablet Take 300 mg by mouth in the morning.    [provider]  atorvastatin  (LIPITOR) 40 MG tablet TAKE 1 TABLET EVERY DAY (NEED MD APPOINTMENT, CALL [phone]) 06/21/24   Camnitz, Soyla Gladis, MD  budeson-glycopyrrolate-formoterol (BREZTRI  AEROSPHERE) 160-9-4.8 MCG/ACT AERO Inhale 2 puffs into the lungs in the morning and at bedtime. Patient not taking: Reported on 07/29/2024 02/16/24   Ruthell Lauraine FALCON, NP  carvedilol  (COREG ) 25 MG tablet Take 1 tablet (25 mg total) by mouth 2 (two) times daily with a meal. 07/08/24   Camnitz, Soyla Gladis, MD  Cyanocobalamin  (VITAMIN B-12 PO) Take 1 tablet by mouth daily.    [provider]  diphenhydramine -acetaminophen  (TYLENOL  PM) 25-500 MG  TABS tablet Take 1 tablet by mouth at bedtime.    [provider]  ferrous sulfate  325 (65 FE) MG tablet Take 1 tablet (325 mg total) by mouth every morning. 10/27/18   Camnitz, Soyla Lunger, MD  folic acid  (FOLVITE ) 1 MG tablet Take 1 mg by mouth daily. 06/14/24   [provider]  furosemide  (LASIX ) 40 MG tablet Take 1 tablet (40 mg total) by mouth daily. 07/08/24   Camnitz, Soyla Lunger, MD  hydrocortisone  1 % lotion Apply 1 Application topically 2 (two) times daily. 04/01/24   Sherrod Sherrod, MD  lisinopril  (ZESTRIL ) 40 MG tablet TAKE 1 TABLET EVERY DAY 03/29/24   Camnitz, Soyla Lunger, MD  nitroGLYCERIN  (NITROSTAT ) 0.4 MG SL tablet Place 1 tablet (0.4 mg total) under the tongue every 5 (five) minutes as needed for chest pain. X 3 doses 04/09/23   Swinyer, Rosaline HERO, NP   pantoprazole  (PROTONIX ) 40 MG tablet Take 40 mg by mouth daily. 10/21/14   [provider]  polyvinyl alcohol  (LIQUIFILM TEARS) 1.4 % ophthalmic solution Place 1 drop into both eyes daily as needed for dry eyes.    [provider]  rivaroxaban  (XARELTO ) 20 MG TABS tablet Take 1 tablet (20 mg total) by mouth at bedtime. Okay to restart this medication on 02/10/24 06/22/24   Jerilynn Lamarr HERO, NP     Family History  Problem Relation Age of Onset   Heart attack Father 88   Congestive Heart Failure Father 57   Cancer Paternal Uncle    Cancer Other     Social History   Socioeconomic History   Marital status: Married    Spouse name: Not on file   Number of children: Not on file   Years of education: Not on file   Highest education level: Not on file  Occupational History   Not on file  Tobacco Use   Smoking status: Never    Passive exposure: Past   Smokeless tobacco: Never  Vaping Use   Vaping status: Never Used  Substance and Sexual Activity   Alcohol  use: No    Comment: 1 beer a year   Drug use: No   Sexual activity: Not on file  Other Topics Concern   Not on file  Social History Narrative   Not on file   Social Drivers of Health   Financial Resource Strain: Not on file  Food Insecurity: No Food Insecurity (06/26/2024)   Hunger Vital Sign    Worried About Running Out of Food in the Last Year: Never true    Ran Out of Food in the Last Year: Never true  Transportation Needs: No Transportation Needs (06/26/2024)   PRAPARE - Administrator, Civil Service (Medical): No    Lack of Transportation (Non-Medical): No  Physical Activity: Not on file  Stress: Not on file  Social Connections: Moderately Isolated (06/26/2024)   Social Connection and Isolation Panel    Frequency of Communication with Friends and Family: More than three times a week    Frequency of Social Gatherings with Friends and Family: Twice a week    Attends Religious Services:  Never    Database administrator or Organizations: No    Attends Banker Meetings: Never    Marital Status: Married       Review of Systems  Vital Signs:   Advance Care Plan: no documents on file  Physical Exam  Imaging: CT Chest W Contrast Result Date: 07/29/2024 CLINICAL DATA:  Non-small-cell lung cancer restaging * Tracking Code: BO * EXAM: CT CHEST WITH CONTRAST TECHNIQUE: Multidetector CT imaging of the chest was performed during intravenous contrast administration. RADIATION DOSE REDUCTION: This exam was performed according to the departmental dose-optimization program which includes automated exposure control, adjustment of the mA and/or kV according to patient size and/or use of iterative reconstruction technique. CONTRAST:  75mL OMNIPAQUE  IOHEXOL  300 MG/ML  SOLN COMPARISON:  CT chest, 06/25/2024 PET-CT, 02/23/2024 FINDINGS: Cardiovascular: Aortic atherosclerosis. Cardiomegaly. Three-vessel coronary artery calcifications. No pericardial effusion. Mediastinum/Nodes: Slightly diminished size of right paratracheal lymph nodes, measuring up to 1.5 x 1.0 cm, previously 1.6 x 1.3 cm (series 2, image 36). Moderate hiatal hernia with intrathoracic position of the gastric fundus. Thyroid  gland, trachea, and esophagus demonstrate no significant findings. Lungs/Pleura: No significant change in post treatment appearance of the chest, with dense, masslike and partially cavitary consolidation of the right upper lobe measuring 7.1 x 3.6 cm (series 8, image 38) and in the superior segment right lower lobe measuring 3.5 x 3.2 cm (series 8, image 54). Although both of these lesions were FDG avid on prior PET-CT the right lower lobe lesion was with a greater degree of avidity. Small right, trace left pleural effusions. Underlying mild bibasilar predominance pulmonary fibrosis. Upper Abdomen: No acute abnormality.  Small gallstone. Musculoskeletal: No chest wall abnormality. No acute osseous  findings. IMPRESSION: 1. No significant change in post treatment appearance of the chest, with dense, masslike and partially cavitary consolidation of the right upper lobe and superior segment right lower lobe. Although both of these lesions were FDG avid on prior PET-CT the right lower lobe lesion was with a greater degree of avidity. 2. Slightly diminished size of right paratracheal lymph nodes. 3. Small right, trace left pleural effusions. 4. Underlying mild bibasilar predominant pulmonary fibrosis. 5. Cardiomegaly and coronary artery disease. 6. Moderate hiatal hernia with intrathoracic position of the gastric fundus. 7. Cholelithiasis. Aortic Atherosclerosis (ICD10-I70.0). Electronically Signed   By: Marolyn JONETTA Jaksch M.D.   On: 07/29/2024 07:16    Labs:  CBC: Recent Labs    06/25/24 2010 06/26/24 0333 07/13/24 0952 07/22/24 1212  WBC 5.9 3.2* 4.4 4.1  HGB 10.3* 9.0* 9.4* 8.8*  HCT 31.0* 27.0* 27.6* 26.0*  PLT 157 112* 50* 55*    COAGS: No results for input(s): INR, APTT in the last 8760 hours.  BMP: Recent Labs    06/25/24 2010 06/26/24 0333 07/13/24 0952 07/22/24 1212  NA 138 137 142 144  K 4.5 4.2 4.1 4.4  CL 109 107 109 110  CO2 17* 19* 26 28  GLUCOSE 183* 164* 147* 146*  BUN 38* 37* 22 18  CALCIUM  9.2 8.8* 9.2 9.0  CREATININE 1.36* 1.26* 1.09 1.04  GFRNONAA 53* 58* >60 >60    LIVER FUNCTION TESTS: Recent Labs    06/21/24 0853 06/25/24 2030 07/13/24 0952 07/22/24 1212  BILITOT 0.6 1.0 0.6 0.8  AST 24 36 21 19  ALT 19 24 16 12   ALKPHOS 90 80 100 103  PROT 6.5 6.7 6.3* 6.6  ALBUMIN 4.0 3.7 3.7 3.9    TUMOR MARKERS: No results for input(s): AFPTM, CEA, CA199, CHROMGRNA in the last 8760 hours.  Assessment and Plan: 78 y.o. 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  He is scheduled today for image guided bone marrow biopsy for further evaluation.Risks and benefits of procedure was discussed with the patient  including, but not limited to bleeding, infection, damage to adjacent structures or low yield requiring additional tests.  All of the questions were answered and there is agreement to proceed.  Consent signed and in chart.    Thank you for allowing our service to participate in DOMANICK CUCCIA 's care.  Electronically Signed: D. Franky Rakers, PA-C   08/05/2024, 10:17 AM      I spent a total of 20 minutes    in face to face in clinical consultation, greater than 50% of which was counseling/coordinating care for image guided bone marrow biopsy

## 2024-08-06 ENCOUNTER — Other Ambulatory Visit: Payer: Self-pay

## 2024-08-06 ENCOUNTER — Encounter (HOSPITAL_COMMUNITY): Payer: Self-pay

## 2024-08-06 ENCOUNTER — Ambulatory Visit (HOSPITAL_COMMUNITY)
Admission: RE | Admit: 2024-08-06 | Discharge: 2024-08-06 | Disposition: A | Discharge status: Home / Self Care | Source: Ambulatory Visit | Attending: Internal Medicine | Admitting: Internal Medicine

## 2024-08-06 DIAGNOSIS — D696 Thrombocytopenia, unspecified: Secondary | ICD-10-CM | POA: Insufficient documentation

## 2024-08-06 DIAGNOSIS — D500 Iron deficiency anemia secondary to blood loss (chronic): Secondary | ICD-10-CM

## 2024-08-06 DIAGNOSIS — G4730 Sleep apnea, unspecified: Secondary | ICD-10-CM | POA: Diagnosis not present

## 2024-08-06 DIAGNOSIS — Z85118 Personal history of other malignant neoplasm of bronchus and lung: Secondary | ICD-10-CM | POA: Insufficient documentation

## 2024-08-06 DIAGNOSIS — Z87442 Personal history of urinary calculi: Secondary | ICD-10-CM | POA: Insufficient documentation

## 2024-08-06 DIAGNOSIS — Z1379 Encounter for other screening for genetic and chromosomal anomalies: Secondary | ICD-10-CM | POA: Diagnosis not present

## 2024-08-06 DIAGNOSIS — D509 Iron deficiency anemia, unspecified: Secondary | ICD-10-CM | POA: Diagnosis not present

## 2024-08-06 DIAGNOSIS — I2510 Atherosclerotic heart disease of native coronary artery without angina pectoris: Secondary | ICD-10-CM | POA: Insufficient documentation

## 2024-08-06 DIAGNOSIS — D649 Anemia, unspecified: Secondary | ICD-10-CM | POA: Diagnosis not present

## 2024-08-06 DIAGNOSIS — N183 Chronic kidney disease, stage 3 unspecified: Secondary | ICD-10-CM | POA: Diagnosis not present

## 2024-08-06 DIAGNOSIS — I4819 Other persistent atrial fibrillation: Secondary | ICD-10-CM | POA: Diagnosis not present

## 2024-08-06 DIAGNOSIS — Z955 Presence of coronary angioplasty implant and graft: Secondary | ICD-10-CM | POA: Insufficient documentation

## 2024-08-06 DIAGNOSIS — J449 Chronic obstructive pulmonary disease, unspecified: Secondary | ICD-10-CM | POA: Insufficient documentation

## 2024-08-06 DIAGNOSIS — I252 Old myocardial infarction: Secondary | ICD-10-CM | POA: Diagnosis not present

## 2024-08-06 DIAGNOSIS — E785 Hyperlipidemia, unspecified: Secondary | ICD-10-CM | POA: Diagnosis not present

## 2024-08-06 DIAGNOSIS — D631 Anemia in chronic kidney disease: Secondary | ICD-10-CM | POA: Diagnosis not present

## 2024-08-06 DIAGNOSIS — I130 Hypertensive heart and chronic kidney disease with heart failure and stage 1 through stage 4 chronic kidney disease, or unspecified chronic kidney disease: Secondary | ICD-10-CM | POA: Insufficient documentation

## 2024-08-06 LAB — CBC WITH DIFFERENTIAL/PLATELET
Abs Immature Granulocytes: 0.02 K/uL (ref 0.00–0.07)
Basophils Absolute: 0 K/uL (ref 0.0–0.1)
Basophils Relative: 0 %
Eosinophils Absolute: 0 K/uL (ref 0.0–0.5)
Eosinophils Relative: 0 %
HCT: 24.4 % — ABNORMAL LOW (ref 39.0–52.0)
Hemoglobin: 7.8 g/dL — ABNORMAL LOW (ref 13.0–17.0)
Immature Granulocytes: 0 %
Lymphocytes Relative: 10 %
Lymphs Abs: 0.5 K/uL — ABNORMAL LOW (ref 0.7–4.0)
MCH: 36.8 pg — ABNORMAL HIGH (ref 26.0–34.0)
MCHC: 32 g/dL (ref 30.0–36.0)
MCV: 115.1 fL — ABNORMAL HIGH (ref 80.0–100.0)
Monocytes Absolute: 0.4 K/uL (ref 0.1–1.0)
Monocytes Relative: 9 %
Neutro Abs: 3.9 K/uL (ref 1.7–7.7)
Neutrophils Relative %: 81 %
Platelets: 105 K/uL — ABNORMAL LOW (ref 150–400)
RBC: 2.12 MIL/uL — ABNORMAL LOW (ref 4.22–5.81)
RDW: 18.7 % — ABNORMAL HIGH (ref 11.5–15.5)
Smear Review: NORMAL
WBC: 4.7 K/uL (ref 4.0–10.5)
nRBC: 0.4 % — ABNORMAL HIGH (ref 0.0–0.2)

## 2024-08-06 MED ORDER — FENTANYL CITRATE (PF) 100 MCG/2ML IJ SOLN
INTRAMUSCULAR | Status: AC
  Filled 2024-08-06: qty 2

## 2024-08-06 MED ORDER — SODIUM CHLORIDE 0.9 % IV SOLN: INTRAVENOUS | Status: DC

## 2024-08-06 MED ORDER — SODIUM CHLORIDE 0.9 % IV SOLN
INTRAVENOUS | Status: AC
  Filled 2024-08-06: qty 500

## 2024-08-06 MED ORDER — FENTANYL CITRATE (PF) 100 MCG/2ML IJ SOLN
INTRAMUSCULAR | Status: AC | PRN
  Administered 2024-08-06: 25 ug via INTRAVENOUS
  Administered 2024-08-06: 50 ug via INTRAVENOUS

## 2024-08-06 MED ORDER — MIDAZOLAM HCL 2 MG/2ML IJ SOLN
INTRAMUSCULAR | Status: AC | PRN
  Administered 2024-08-06: .5 mg via INTRAVENOUS
  Administered 2024-08-06: 1 mg via INTRAVENOUS
  Administered 2024-08-06: .5 mg via INTRAVENOUS

## 2024-08-06 MED ORDER — MIDAZOLAM HCL 2 MG/2ML IJ SOLN
INTRAMUSCULAR | Status: AC
  Filled 2024-08-06: qty 2

## 2024-08-06 NOTE — Procedures (Signed)
 Interventional Radiology Procedure Note  Procedure: CT Guided Biopsy of bone marrlow  Complications: None  Estimated Blood Loss: < 10 mL  Findings: 13 G core biopsy of right iliac bone performed under CT guidance.  Aspirate and  core samples obtained and sent to Pathology.  [name] Banner, MD

## 2024-08-09 ENCOUNTER — Encounter: Payer: Self-pay | Admitting: Physician Assistant

## 2024-08-09 ENCOUNTER — Encounter: Payer: Self-pay | Admitting: Internal Medicine

## 2024-08-10 LAB — SURGICAL PATHOLOGY

## 2024-08-11 ENCOUNTER — Other Ambulatory Visit: Payer: Self-pay | Admitting: Physician Assistant

## 2024-08-11 ENCOUNTER — Inpatient Hospital Stay: Attending: Internal Medicine

## 2024-08-11 ENCOUNTER — Inpatient Hospital Stay

## 2024-08-11 VITALS — BP 117/70 | HR 79 | Temp 98.0°F | Resp 18

## 2024-08-11 DIAGNOSIS — Z9981 Dependence on supplemental oxygen: Secondary | ICD-10-CM | POA: Diagnosis not present

## 2024-08-11 DIAGNOSIS — Z5112 Encounter for antineoplastic immunotherapy: Secondary | ICD-10-CM | POA: Insufficient documentation

## 2024-08-11 DIAGNOSIS — D6481 Anemia due to antineoplastic chemotherapy: Secondary | ICD-10-CM | POA: Diagnosis not present

## 2024-08-11 DIAGNOSIS — J9611 Chronic respiratory failure with hypoxia: Secondary | ICD-10-CM | POA: Insufficient documentation

## 2024-08-11 DIAGNOSIS — D500 Iron deficiency anemia secondary to blood loss (chronic): Secondary | ICD-10-CM

## 2024-08-11 DIAGNOSIS — Z79620 Long term (current) use of immunosuppressive biologic: Secondary | ICD-10-CM | POA: Insufficient documentation

## 2024-08-11 DIAGNOSIS — C3431 Malignant neoplasm of lower lobe, right bronchus or lung: Secondary | ICD-10-CM | POA: Diagnosis present

## 2024-08-11 LAB — CMP (CANCER CENTER ONLY)
ALT: 9 U/L (ref 0–44)
AST: 16 U/L (ref 15–41)
Albumin: 3.7 g/dL (ref 3.5–5.0)
Alkaline Phosphatase: 94 U/L (ref 38–126)
Anion gap: 7 (ref 5–15)
BUN: 24 mg/dL — ABNORMAL HIGH (ref 8–23)
CO2: 24 mmol/L (ref 22–32)
Calcium: 8.7 mg/dL — ABNORMAL LOW (ref 8.9–10.3)
Chloride: 110 mmol/L (ref 98–111)
Creatinine: 1.04 mg/dL (ref 0.61–1.24)
GFR, Estimated: >60 mL/min (ref >60)
Glucose, Bld: 164 mg/dL — ABNORMAL HIGH (ref 70–99)
Potassium: 4.1 mmol/L (ref 3.5–5.1)
Sodium: 141 mmol/L (ref 135–145)
Total Bilirubin: 0.5 mg/dL (ref 0.0–1.2)
Total Protein: 6.3 g/dL — ABNORMAL LOW (ref 6.5–8.1)

## 2024-08-11 LAB — CBC WITH DIFFERENTIAL (CANCER CENTER ONLY)
Abs Immature Granulocytes: 0.02 K/uL (ref 0.00–0.07)
Basophils Absolute: 0 K/uL (ref 0.0–0.1)
Basophils Relative: 0 %
Eosinophils Absolute: 0 K/uL (ref 0.0–0.5)
Eosinophils Relative: 0 %
HCT: 24 % — ABNORMAL LOW (ref 39.0–52.0)
Hemoglobin: 7.8 g/dL — ABNORMAL LOW (ref 13.0–17.0)
Immature Granulocytes: 0 %
Lymphocytes Relative: 10 %
Lymphs Abs: 0.5 K/uL — ABNORMAL LOW (ref 0.7–4.0)
MCH: 36.4 pg — ABNORMAL HIGH (ref 26.0–34.0)
MCHC: 32.5 g/dL (ref 30.0–36.0)
MCV: 112.1 fL — ABNORMAL HIGH (ref 80.0–100.0)
Monocytes Absolute: 0.5 K/uL (ref 0.1–1.0)
Monocytes Relative: 10 %
Neutro Abs: 3.9 K/uL (ref 1.7–7.7)
Neutrophils Relative %: 80 %
Platelet Count: 143 K/uL — ABNORMAL LOW (ref 150–400)
RBC: 2.14 MIL/uL — ABNORMAL LOW (ref 4.22–5.81)
RDW: 18.3 % — ABNORMAL HIGH (ref 11.5–15.5)
WBC Count: 5 K/uL (ref 4.0–10.5)
nRBC: 0.4 % — ABNORMAL HIGH (ref 0.0–0.2)

## 2024-08-11 LAB — IRON AND IRON BINDING CAPACITY (CC-WL,HP ONLY)
Iron: 82 ug/dL (ref 45–182)
Saturation Ratios: 24 % (ref 17.9–39.5)
TIBC: 346 ug/dL (ref 250–450)
UIBC: 264 ug/dL (ref 117–376)

## 2024-08-11 LAB — FERRITIN: Ferritin: 73 ng/mL (ref 24–336)

## 2024-08-11 LAB — PREPARE RBC (CROSSMATCH)

## 2024-08-11 LAB — SAMPLE TO BLOOD BANK

## 2024-08-11 LAB — TSH: TSH: 1.53 u[IU]/mL (ref 0.350–4.500)

## 2024-08-11 MED ORDER — SODIUM CHLORIDE 0.9 % IV SOLN
1500.0000 mg | Freq: Once | INTRAVENOUS | Status: AC
  Administered 2024-08-11: 1500 mg via INTRAVENOUS
  Filled 2024-08-11: qty 30

## 2024-08-11 MED ORDER — SODIUM CHLORIDE 0.9 % IV SOLN: INTRAVENOUS | Status: DC

## 2024-08-11 NOTE — Patient Instructions (Signed)
 [facility] - A DEPT OF [facility]  Discharge Instructions: Thank you for choosing [name] to provide your oncology and hematology care.   If you have a lab appointment with the [name], please go directly to the [name] and check in at the registration area.   Wear comfortable clothing and clothing appropriate for easy access to any Portacath or PICC line.   We strive to give you quality time with your provider. You may need to reschedule your appointment if you arrive late (15 or more minutes).  Arriving late affects you and other patients whose appointments are after yours.  Also, if you miss three or more appointments without notifying the office, you may be dismissed from the clinic at the provider's discretion.      For prescription refill requests, have your pharmacy contact our office and allow 72 hours for refills to be completed.    Today you received the following chemotherapy and/or immunotherapy agents: Durvalumab       To help prevent nausea and vomiting after your treatment, we encourage you to take your nausea medication as directed.  BELOW ARE SYMPTOMS THAT SHOULD BE REPORTED IMMEDIATELY: *FEVER GREATER THAN 100.4 F (38 C) OR HIGHER *CHILLS OR SWEATING *NAUSEA AND VOMITING THAT IS NOT CONTROLLED WITH YOUR NAUSEA MEDICATION *UNUSUAL SHORTNESS OF BREATH *UNUSUAL BRUISING OR BLEEDING *URINARY PROBLEMS (pain or burning when urinating, or frequent urination) *BOWEL PROBLEMS (unusual diarrhea, constipation, pain near the anus) TENDERNESS IN MOUTH AND THROAT WITH OR WITHOUT PRESENCE OF ULCERS (sore throat, sores in mouth, or a toothache) UNUSUAL RASH, SWELLING OR PAIN  UNUSUAL VAGINAL DISCHARGE OR ITCHING   Items with * indicate a potential emergency and should be followed up as soon as possible or go to the Emergency Department if any problems should occur.  Please show the CHEMOTHERAPY ALERT CARD or  IMMUNOTHERAPY ALERT CARD at check-in to the Emergency Department and triage nurse.  Should you have questions after your visit or need to cancel or reschedule your appointment, please contact [facility] - A DEPT OF [facility]  Dept: [phone]  and follow the prompts.  Office hours are 8:00 a.m. to 4:30 p.m. Monday - Friday. Please note that voicemails left after 4:00 p.m. may not be returned until the following business day.  We are closed weekends and major holidays. You have access to a nurse at all times for urgent questions. Please call the main number to the clinic Dept: [phone] and follow the prompts.   For any non-urgent questions, you may also contact your provider using MyChart. We now offer e-Visits for anyone 18 and older to request care online for non-urgent symptoms. For details visit [email or web address].   Also download the MyChart app! Go to the app store, search "MyChart", open the app, select [facility], and log in with your MyChart username and password.

## 2024-08-12 ENCOUNTER — Telehealth: Payer: Self-pay

## 2024-08-12 LAB — T4: T4, Total: 6.9 ug/dL (ref 4.5–12.0)

## 2024-08-12 NOTE — Telephone Encounter (Signed)
-----   Message from Nurse Damien ORN sent at 08/11/2024  4:33 PM EDT ----- Regarding: Dr Sherrod First time duvalumab First time tx. Tolerated no issues. Please f/u. Thanks!

## 2024-08-12 NOTE — Telephone Encounter (Signed)
 LM for patient that this nurse was calling to see how they were doing after their treatment. Please call back to Dr. Asa Lente nurse at [phone] if they have any questions or concerns regarding the treatment.

## 2024-08-13 ENCOUNTER — Inpatient Hospital Stay

## 2024-08-13 DIAGNOSIS — D500 Iron deficiency anemia secondary to blood loss (chronic): Secondary | ICD-10-CM

## 2024-08-13 DIAGNOSIS — Z5112 Encounter for antineoplastic immunotherapy: Secondary | ICD-10-CM | POA: Diagnosis not present

## 2024-08-13 MED ORDER — SODIUM CHLORIDE 0.9% IV SOLUTION
250.0000 mL | INTRAVENOUS | Status: DC
  Administered 2024-08-13: 250 mL via INTRAVENOUS

## 2024-08-13 MED ORDER — DIPHENHYDRAMINE HCL 25 MG PO CAPS
25.0000 mg | ORAL_CAPSULE | Freq: Once | ORAL | Status: AC
  Administered 2024-08-13: 25 mg via ORAL
  Filled 2024-08-13: qty 1

## 2024-08-13 MED ORDER — ACETAMINOPHEN 325 MG PO TABS
650.0000 mg | ORAL_TABLET | Freq: Once | ORAL | Status: AC
  Administered 2024-08-13: 650 mg via ORAL
  Filled 2024-08-13: qty 2

## 2024-08-13 NOTE — Patient Instructions (Signed)

## 2024-08-14 LAB — TYPE AND SCREEN
ABO/RH(D): O POS
Antibody Screen: NEGATIVE
Unit division: 0

## 2024-08-14 LAB — BPAM RBC
Blood Product Expiration Date: 202511022359
ISSUE DATE / TIME: 202510031406
Unit Type and Rh: 5100

## 2024-08-16 ENCOUNTER — Encounter (HOSPITAL_COMMUNITY): Payer: Self-pay | Admitting: Internal Medicine

## 2024-08-18 ENCOUNTER — Ambulatory Visit: Admitting: Internal Medicine

## 2024-08-18 ENCOUNTER — Other Ambulatory Visit

## 2024-08-20 ENCOUNTER — Telehealth: Payer: Self-pay

## 2024-08-20 NOTE — Telephone Encounter (Signed)
 S/w patient about recent drop in oxygen saturations with any activity. Patient notes that ambulation has improved with recent blood transfusion on 10/3, however his oxygen levels have continued to drop with any kind of movement. Oxygen drops to mid- to low 80's with lowest reading being 78%. Patient does not have any oxygen or inhalers at home. Patient states that his levels recover with rest, but this is an acute change.  Patient audibly short of breath with conversation, which he reports as a new symptom. Patient also noting worsening cough over the past week. Patient denies any fever or sick contacts at this time. Patient recently started new immunotherapy on 10/1, which puts him at increase risk for URI, dyspnea, PNA, and pneumonitis.  Patient recommended to get immediate evaluation at the emergency room. Patient states that he feels okay, but if he feels worse he will have his wife take him to the ER.  Reiterated advice to get evaluated at ER at this time. Strict ER precautions reviewed.  Patient verbalized an understanding of recommendations.

## 2024-08-23 ENCOUNTER — Telehealth: Payer: Self-pay | Admitting: *Deleted

## 2024-08-23 ENCOUNTER — Ambulatory Visit (INDEPENDENT_AMBULATORY_CARE_PROVIDER_SITE_OTHER)

## 2024-08-23 ENCOUNTER — Encounter: Payer: Self-pay | Admitting: Primary Care

## 2024-08-23 ENCOUNTER — Telehealth: Payer: Self-pay | Admitting: Medical Oncology

## 2024-08-23 ENCOUNTER — Ambulatory Visit: Admitting: Primary Care

## 2024-08-23 ENCOUNTER — Telehealth: Payer: Self-pay

## 2024-08-23 ENCOUNTER — Ambulatory Visit: Payer: Self-pay | Admitting: Primary Care

## 2024-08-23 VITALS — BP 109/78 | HR 83 | Temp 97.7°F | Ht 68.0 in | Wt 212.0 lb

## 2024-08-23 DIAGNOSIS — R0609 Other forms of dyspnea: Secondary | ICD-10-CM

## 2024-08-23 DIAGNOSIS — R918 Other nonspecific abnormal finding of lung field: Secondary | ICD-10-CM | POA: Diagnosis not present

## 2024-08-23 DIAGNOSIS — C3431 Malignant neoplasm of lower lobe, right bronchus or lung: Secondary | ICD-10-CM

## 2024-08-23 DIAGNOSIS — D6481 Anemia due to antineoplastic chemotherapy: Secondary | ICD-10-CM | POA: Diagnosis not present

## 2024-08-23 DIAGNOSIS — J9600 Acute respiratory failure, unspecified whether with hypoxia or hypercapnia: Secondary | ICD-10-CM

## 2024-08-23 DIAGNOSIS — Z85118 Personal history of other malignant neoplasm of bronchus and lung: Secondary | ICD-10-CM | POA: Diagnosis not present

## 2024-08-23 DIAGNOSIS — J90 Pleural effusion, not elsewhere classified: Secondary | ICD-10-CM | POA: Diagnosis not present

## 2024-08-23 DIAGNOSIS — G4733 Obstructive sleep apnea (adult) (pediatric): Secondary | ICD-10-CM

## 2024-08-23 DIAGNOSIS — D649 Anemia, unspecified: Secondary | ICD-10-CM

## 2024-08-23 DIAGNOSIS — R0902 Hypoxemia: Secondary | ICD-10-CM | POA: Diagnosis not present

## 2024-08-23 LAB — CBC WITH DIFFERENTIAL/PLATELET
Basophils Absolute: 0 K/uL (ref 0.0–0.1)
Basophils Relative: 0.1 % (ref 0.0–3.0)
Eosinophils Absolute: 0 K/uL (ref 0.0–0.7)
Eosinophils Relative: 0.1 % (ref 0.0–5.0)
HCT: 33.5 % — ABNORMAL LOW (ref 39.0–52.0)
Hemoglobin: 11 g/dL — ABNORMAL LOW (ref 13.0–17.0)
Lymphocytes Relative: 7.4 % — ABNORMAL LOW (ref 12.0–46.0)
Lymphs Abs: 0.6 K/uL — ABNORMAL LOW (ref 0.7–4.0)
MCHC: 32.8 g/dL (ref 30.0–36.0)
MCV: 106.9 fl — ABNORMAL HIGH (ref 78.0–100.0)
Monocytes Absolute: 0.6 K/uL (ref 0.1–1.0)
Monocytes Relative: 8.6 % (ref 3.0–12.0)
Neutro Abs: 6.3 K/uL (ref 1.4–7.7)
Neutrophils Relative %: 83.8 % — ABNORMAL HIGH (ref 43.0–77.0)
Platelets: 175 K/uL (ref 150.0–400.0)
RBC: 3.13 Mil/uL — ABNORMAL LOW (ref 4.22–5.81)
RDW: 20.7 % — ABNORMAL HIGH (ref 11.5–15.5)
WBC: 7.5 K/uL (ref 4.0–10.5)

## 2024-08-23 NOTE — Progress Notes (Signed)
 I sent a message about CXR. Please also let him know his anemia was better, Hgb 11 (up form 7.8). He should not need a blood transfusion. WBC was normal. I will get back to him shortly regarding whether or not he needs fluid drained

## 2024-08-23 NOTE — Progress Notes (Unsigned)
 @Patient  ID: Peter [name], male    DOB: [birth year], 78 y.o.   MRN: [id number]  No chief complaint on file.   Referring provider: [name], MD  HPI: PMH significant for HTN, afib, CAD, CHF, GERD, primary adenocarcinoma right lower lobe, CKD, obesity.  08/23/2024 Discussed the use of AI scribe software for clinical note transcription with the patient, who gave verbal consent to proceed.  History of Present Illness Peter [name] is a 78 year old male with adenocarcinoma of the lung who presents with anemia and respiratory symptoms.  He has a history of adenocarcinoma of the right lung and completed chemotherapy and radiation on August 15th. He is currently on immunotherapy with durvalumab, receiving treatments every four weeks, with the first dose administered on October 1st.  He has a history of anemia, which has been borderline for years but worsened following radiation therapy. In June, he experienced significant issues with anemia, with oxygen saturation dropping to 90-91% after walking. Recently, after a shower, his oxygen level was 85%, taking two minutes to reach 90% and five minutes to reach 96%. He uses a walker on days when he feels he needs it. His last hemoglobin was 7.8, and he received one unit of blood transfusion on October 3rd. Post-transfusion, he felt slightly better but not significantly improved. His red blood cell count was 2.12.  He experiences significant fatigue and shortness of breath with minimal exertion, such as walking to the kitchen. He has a persistent dry cough, worse in the mornings, and uses a CPAP machine at night, which helps him breathe better. No coughing up blood or experiencing chest pain. He has a history of fluid around the lung, noted on a CT scan in September, which according to his oncologist was too small to drain.  He underwent a bone marrow biopsy recently, but results are pending. He has not had any recent appointments since his  last immunotherapy session and transfusion.  Acute visit. Reports increased shortness of breath and low oxygen levels with activity. He reports recovering with rest. He is following with Dr. Sherrod for adenocarcinoma and is current undergoing chemotherapy. He is anemic, Hgb on 08/11/24 was 2.14. He was transfused on 10/3. He spoke with oncology on 10/10 and was advised to go to ED but patient declined. He feels alright and is able to speak in full sentences.   Hgb  7.8 on 10/1  Needs CBC rechecked Needs qualify for O2  Allergies  Allergen Reactions   Prednisone  Other (See Comments)    Made BS increase to 500- discovered on 01/22/24 Cannot take dose pack   Spironolactone  Other (See Comments)    Tender breast    Immunization History  Administered Date(s) Administered   PFIZER(Purple Top)SARS-COV-2 Vaccination 02/21/2020, 03/13/2020   PNEUMOCOCCAL CONJUGATE-20 12/21/2023    Past Medical History:  Diagnosis Date   Arthritis    At risk for sleep apnea    STOP-BANG= 5       SENT TO PCP 07-12-2015   CAD (coronary artery disease)    a. s/p PCI with DES x 3 to LAD in 2006 with bifurcation lesion and Plavix  indefinitely was recommended.   Chronic combined systolic and diastolic CHF (congestive heart failure) (HCC)    a. Prior EF normal but EF dropped to 30-35% in 07/2016 in setting of AFIB.   CKD (chronic kidney disease), stage III (HCC)    Depression    Dyslipidemia    Dyspnea    tired quick d/t  Afib   Dyspnea on exertion    Dysrhythmia    A.FIB   GERD (gastroesophageal reflux disease)    History of GI bleed    History of kidney stones    History of MI (myocardial infarction)    11-05-2005--  periprocedural MI  (cardiac cath) per dr obie note   History of radiation therapy    Right lung-05/11/24-06/25/24- Dr. Lynwood Nasuti   Hypertension    Hypertriglyceridemia    Iron deficiency anemia    Myocardial infarction [facility])    Persistent atrial fibrillation (HCC)    a. 07/2016,  started on Eliquis  -> readmitted later that month for AF RVR/acute HF - initially unsuccessful DCCV following TEE, so loaded with amio with repeat successful DCCV 07/2816.   Right ureteral stone    S/P drug eluting coronary stent placement    x3  to LAD  2006   Sleep apnea    wears BIPAP, 14 up to 24    Tobacco History: Social History   Tobacco Use  Smoking Status Never   Passive exposure: Past  Smokeless Tobacco Never   Counseling given: Not Answered   Outpatient Medications Prior to Visit  Medication Sig Dispense Refill   albuterol  (VENTOLIN  HFA) 108 (90 Base) MCG/ACT inhaler Inhale 2 puffs into the lungs every 6 (six) hours as needed for wheezing or shortness of breath (intractable cough). (Patient not taking: Reported on 07/29/2024) 8 g 2   allopurinol  (ZYLOPRIM ) 100 MG tablet Take 100 mg by mouth at bedtime.     allopurinol  (ZYLOPRIM ) 300 MG tablet Take 300 mg by mouth in the morning.     atorvastatin  (LIPITOR) 40 MG tablet TAKE 1 TABLET EVERY DAY (NEED MD APPOINTMENT, CALL [phone]) 90 tablet 1   budeson-glycopyrrolate-formoterol (BREZTRI  AEROSPHERE) 160-9-4.8 MCG/ACT AERO Inhale 2 puffs into the lungs in the morning and at bedtime. (Patient not taking: Reported on 07/29/2024)     carvedilol  (COREG ) 25 MG tablet Take 1 tablet (25 mg total) by mouth 2 (two) times daily with a meal. 180 tablet 0   Cyanocobalamin  (VITAMIN B-12 PO) Take 1 tablet by mouth daily.     diphenhydramine -acetaminophen  (TYLENOL  PM) 25-500 MG TABS tablet Take 1 tablet by mouth at bedtime.     ferrous sulfate  325 (65 FE) MG tablet Take 1 tablet (325 mg total) by mouth every morning. 90 tablet 2   folic acid  (FOLVITE ) 1 MG tablet Take 1 mg by mouth daily.     furosemide  (LASIX ) 40 MG tablet Take 1 tablet (40 mg total) by mouth daily. 90 tablet 0   hydrocortisone  1 % lotion Apply 1 Application topically 2 (two) times daily. 118 mL 0   lisinopril  (ZESTRIL ) 40 MG tablet TAKE 1 TABLET EVERY DAY 90 tablet 3    nitroGLYCERIN  (NITROSTAT ) 0.4 MG SL tablet Place 1 tablet (0.4 mg total) under the tongue every 5 (five) minutes as needed for chest pain. X 3 doses 25 tablet 5   pantoprazole  (PROTONIX ) 40 MG tablet Take 40 mg by mouth daily.     polyvinyl alcohol  (LIQUIFILM TEARS) 1.4 % ophthalmic solution Place 1 drop into both eyes daily as needed for dry eyes.     rivaroxaban  (XARELTO ) 20 MG TABS tablet Take 1 tablet (20 mg total) by mouth at bedtime. Okay to restart this medication on 02/10/24 90 tablet 1   No facility-administered medications prior to visit.      Review of Systems  Review of Systems   Physical Exam  There were  no vitals taken for this visit. Physical Exam  ***  Lab Results:  CBC    Component Value Date/Time   WBC 5.0 08/11/2024 1304   WBC 4.7 08/06/2024 0926   RBC 2.14 (L) 08/11/2024 1304   HGB 7.8 (L) 08/11/2024 1304   HCT 24.0 (L) 08/11/2024 1304   PLT 143 (L) 08/11/2024 1304   MCV 112.1 (H) 08/11/2024 1304   MCH 36.4 (H) 08/11/2024 1304   MCHC 32.5 08/11/2024 1304   RDW 18.3 (H) 08/11/2024 1304   LYMPHSABS 0.5 (L) 08/11/2024 1304   MONOABS 0.5 08/11/2024 1304   EOSABS 0.0 08/11/2024 1304   BASOSABS 0.0 08/11/2024 1304    BMET    Component Value Date/Time   NA 141 08/11/2024 1304   K 4.1 08/11/2024 1304   CL 110 08/11/2024 1304   CO2 24 08/11/2024 1304   GLUCOSE 164 (H) 08/11/2024 1304   BUN 24 (H) 08/11/2024 1304   CREATININE 1.04 08/11/2024 1304   CREATININE 1.72 (H) 10/16/2016 1115   CALCIUM  8.7 (L) 08/11/2024 1304   GFRNONAA >60 08/11/2024 1304   GFRAA >60 03/03/2018 0447    BNP    Component Value Date/Time   BNP 416.8 (H) 01/23/2024 0220    ProBNP    Component Value Date/Time   PROBNP 144.7 (H) 02/03/2012 1854    Imaging: CT BONE MARROW BIOPSY & ASPIRATION Result Date: 08/06/2024 CLINICAL DATA:  Anemia and thrombocytopenia. EXAM: CT-guided bone marrow biopsy TECHNIQUE: CT pelvis CONTRAST:  None RADIOPHARMACEUTICALS:  None COMPARISON:   None FINDINGS: The patient was placed in prone position on the CT gantry. Radiopaque markers were placed on the patient's skin and initial imaging of the pelvis was performed. The patient's skin was then prepped and draped in the usual sterile fashion. Moderate sedation was provided for by the nursing staff under my supervision utilizing intravenous Versed  and fentanyl . The nurse had no other duties other than monitoring the patient and providing sedation during the procedure. I was present for the entire procedure. 2 mg intravenous Versed  and 75 mcg intravenous fentanyl  for a total sedation time of 10 minutes 1% lidocaine  was used to infiltrate the skin at the access site prior to a stab incision. Local anesthesia was then used to infiltrate the region of soft tissue from the skin to the right iliac bone. The bone marrow needle was then advanced and imaging demonstrated the needle tip to be in the cortex of the right iliac bone. The bone was then penetrated and a sample was obtained. After the sample was evaluated, approximately 5 mL of heparinized bone marrow sample was obtained by aspiration. A core sample was then obtained. Multiple attempts at sampling was performed in order to get 2 1 cm segments. All needles were then removed from the patient. Sterile dressing was applied. IMPRESSION: Satisfactory core needle biopsy and aspiration of the right iliac bone marrow under CT guidance. Electronically Signed   By: Cordella Banner   On: 08/06/2024 16:04     Assessment & Plan:   No problem-specific Assessment & Plan notes found for this encounter.   There are no diagnoses linked to this encounter.  Assessment and Plan Assessment & Plan Right lung adenocarcinoma, status post chemo/radiation, on immunotherapy Right lung adenocarcinoma, previously treated with chemotherapy and radiation, now on durvalumab immunotherapy. Completed chemotherapy and radiation on August 15th. First immunotherapy session on  October 1st. Recent CT scan in September showed no significant change post-treatment. Small right-sided pleural effusion noted, not large enough  to warrant drainage. - Continue durvalumab immunotherapy as scheduled.  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 Last hemoglobin was 7.8, received one unit of blood transfusion on October 3rd. Symptoms include fatigue and dyspnea on exertion. Reports feeling slightly better post-transfusion but not significantly improved. - Order repeat blood work to check hemoglobin levels. - Coordinate with oncologist for potential need for further blood transfusion. - Order type and screen in case of need for blood transfusion.  Chronic hypoxemia requiring home oxygen Chronic hypoxemia likely secondary to anemia and lung cancer. Oxygen saturation drops significantly with exertion, requiring prolonged recovery time. Experiences significant desaturation post-shower and requires CPAP for relief. - Walk to qualify for home oxygen. - Provide home oxygen tank and concentrator. - Arrange for portable concentrator evaluation at follow-up. - Blend oxygen into CPAP with adapter.  Chronic dry cough Chronic dry cough, worse in the mornings, possibly related to CPAP use. No hemoptysis or chest pain reported.  Right-sided small pleural effusion, not currently drained Small right-sided pleural effusion noted on recent CT scan. Not large enough to warrant drainage at this time. Fluid is trace and not causing significant symptoms. - Order chest x-ray to evaluate wheezing and oxygen levels.  Recording duration: 13 minutes       Almarie LELON Ferrari, NP 08/23/2024

## 2024-08-23 NOTE — Patient Instructions (Addendum)
 VISIT SUMMARY: Today, we discussed your ongoing treatment for lung cancer, your anemia, and your respiratory symptoms. We reviewed your recent treatments and current symptoms, including fatigue, shortness of breath, and your use of a CPAP machine. We also discussed your recent blood transfusion and the need for ongoing monitoring of your hemoglobin levels.  YOUR PLAN: -RIGHT LUNG ADENOCARCINOMA: You have been diagnosed with adenocarcinoma of the right lung, a type of lung cancer. You have completed chemotherapy and radiation and are now receiving immunotherapy with durvalumab. We will continue with your scheduled immunotherapy treatments.  -ANEMIA SECONDARY TO CANCER THERAPY: Your anemia, which is a condition where you don't have enough healthy red blood cells, has worsened due to your cancer treatments. We will repeat blood work to check your hemoglobin levels and coordinate with your oncologist to determine if you need another blood transfusion. Your type and screen should be good for 30 days so we do not need to repeat this.   -CHRONIC HYPOXEMIA REQUIRING HOME OXYGEN: Chronic hypoxemia means you have low oxygen levels in your blood, likely due to your anemia and lung cancer. We will arrange for you to have a home oxygen tank and concentrator, and evaluate you for a portable concentrator at your follow-up. We will also blend oxygen into your CPAP machine to help you breathe better at night. Wear oxygen with exertion at at night   -CHRONIC DRY COUGH: You have a persistent dry cough, which is worse in the mornings and may be related to your CPAP use. We will monitor this symptom and address it as needed. You can try delsym  over the counter cough syrup for cough suppression.   -RIGHT-SIDED SMALL PLEURAL EFFUSION: A small amount of fluid has been found around your right lung, but it is not large enough to require drainage at this time. We will order a chest x-ray to evaluate your wheezing and oxygen  levels. If moderate or large we will set you up to have this drained at the hospital.   INSTRUCTIONS: Please follow up with your oncologist as scheduled for your immunotherapy treatments. We will repeat your blood work to check your hemoglobin levels and may need to arrange for another blood transfusion. Use your home oxygen as directed and continue using your CPAP machine at night. We will qualify you for a portable oxygen concentrator at your next follow-up. Additionally, we will order a chest x-ray to monitor your lung condition.  Orders: New oxygen start Needs adaptor for oxygen to use with CPAP CXR and labs today (ordered)   Follow-up: 4-6 weeks with Landry or Lauraine NP/ sooner if needed    Home Oxygen Use, Adult When a medical condition keeps you from getting enough oxygen, your health care provider may have you use extra oxygen at home. Your health care provider will let you know: When to use oxygen. How much oxygen to use. The amount is set in liters per minute (LPM or L/M). How long to use oxygen. Home oxygen can be given through: A mask. This covers the nose and mouth or covers a tracheotomy tube. A nasal cannula. This is a device or tube that goes in the nostrils. A transtracheal catheter. This is a small, thin tube placed through the neck and into the windpipe (trachea). A breathing tube (tracheostomy tube) that is surgically placed in the windpipe. These devices have tubing that connects to an oxygen source, such as: A tank. Tanks hold oxygen in gas form. The tank must be replaced when  the oxygen is used up. A liquid oxygen device. This holds oxygen in liquid form. This must be replaced when the oxygen is used up. An oxygen concentrator machine. This filters oxygen in the room. It uses electricity so you must have a backup oxygen tank in case the power goes out. Work with your health care provider to find equipment that works best for you and your lifestyle. What are the  risks? Your health care provider will talk with you about risks. These may include: Fire. This can happen if the oxygen is exposed to a heat source, flame, or spark. Injury to the skin. This can happen if liquid oxygen touches the skin. Pressure sores may occur if the oxygen tubing presses on the skin. Damage to the lungs or other organs. This can happen from getting too little or too much oxygen. Supplies needed: To use oxygen, you will need: A mask, nasal cannula, transtracheal catheter, or tracheostomy. An oxygen tank, a liquid oxygen device, or an oxygen concentrator. Your health care provider may recommend: A humidifier. This device adds moisture to the oxygen. A pulse oximeter. This device measures the percentage of oxygen in your blood. How to use oxygen You will be shown how to use your oxygen device. Follow the instructions, which may look something like this: Wash your hands with soap and water  for at least 20 seconds. Turn on the oxygen. Make sure the oxygen unit is working right. To do this: Place the end of the oxygen tubing in a cup of water  before connecting it to the nasal cannula, mask, or transtracheal catheter. The water  will bubble if oxygen is flowing. Place one end of the oxygen tubing into the port on the tank, device, or machine. Connect the other end to the nasal cannula, mask, or transtracheal catheter. Turn the liter-flow setting on the machine to the level you are told. Place the nasal cannula in your nose, or place the mask over your mouth and nose or tracheostomy tube. Turn off the oxygen when you are not using it. How to clean and care for the oxygen supplies Clean or replace your oxygen equipment and supplies as told by the medical device company that supplies the equipment. Safety tips Fire safety tips  Keep your oxygen and oxygen supplies at least 6 ft (2 m) away from sources of heat, flames, and sparks at all times. Do not allow smoking near your oxygen.  Put up no smoking signs in your home. Avoid smoking areas in public. Do not use materials that can burn (are flammable) while you use oxygen. This includes: Petroleum jelly. Hand sanitizer. Rubbing alcohol . Hair spray or other aerosol sprays. Keep a Government social research officer nearby. Tell your fire department that you have oxygen in your home. Test your home smoke detectors often. Traveling Secure your oxygen tank in the vehicle so that it does not move. Follow instructions from your medical device company about how to safely secure your tank. Have enough oxygen for the amount of time you will be away from home. If you plan to travel by public transportation such as airplane, train, bus, or boat, contact the company to arrange a portable oxygen delivery system. You may need documents from your health care provider and medical device company before you travel. General safety tips Keep extra supplies on hand, including extra tubing and an extra cannula or mask. If you use an oxygen cylinder, keep it in a stand or secure it to an object that will not move.  If you use liquid oxygen, always keep the container upright. If you use an oxygen concentrator: [employer] cur[employer] company. Make sure you are given priority service if your power goes out. Avoid using extension cords if possible. Keep an extra supply of backup oxygen tanks. Follow these instructions at home: Use oxygen only as told by your health care provider. Do not use alcohol  or other drugs that make you relax (sedating drugs) unless told. They can slow down your breathing rate and make it hard to get in enough oxygen. Know how and when to order a refill of oxygen. Plan for holidays when you may not be able to get a prescription filled. Use water -based lubricants on your lips or nostrils. Do not use oil-based products like petroleum jelly. Ask your health care provider how to prevent skin irritation on your cheeks or behind your ears. Contact a  health care provider if: You are more tired than normal and have little energy. You have dry or irritated skin in your nose or on your face. You have nosebleeds. You are restless, irritable, or anxious. You get headaches often. You are not sleeping well. Get help right away if: You have trouble breathing. You are confused. You are sleepy all the time. You have blue lips or fingernails. These symptoms may be an emergency. Get help right away. Call 911. Do not wait to see if the symptoms will go away. Do not drive yourself to the hospital. This information is not intended to replace advice given to you by your health care provider. Make sure you discuss any questions you have with your health care provider. Document Revised: 05/28/2022 Document Reviewed: 05/28/2022 Elsevier Patient Education  2024 [employer].

## 2024-08-23 NOTE — Telephone Encounter (Signed)
 I called and spoke with the pt  He did not go to ED over the weekend  He states that he feels okay at rest, but when he walks to the bathroom his sats drop to 86-88%ra I got him scheduled with Beth for today at 1:30  Advised ED sooner if needed

## 2024-08-23 NOTE — Telephone Encounter (Signed)
-----   Message from Nurse Diane B sent at 08/23/2024  9:34 AM EDT ----- Regarding: Needs follow up appt. for Oxygen   Please see message below in regard to Oxygen. Thank you   08/20/24 12:42 PM  Note S/w patient about recent drop in oxygen saturations with any activity. Patient notes that ambulation has improved with recent blood transfusion on 10/3, however his oxygen levels have continued to drop with any kind of movement. Oxygen drops to mid- to low 80's with lowest reading being 78%. Patient does not have any oxygen or inhalers at home. Patient states that his levels recover with rest, but this is an acute change.  Patient audibly short of breath with conversation, which he reports as a new symptom. Patient also noting worsening cough over the past week. Patient denies any fever or sick contacts at this time. Patient recently started new immunotherapy on 10/1, which puts him at increase risk for URI, dyspnea, PNA, and pneumonitis.  Patient recommended to get immediate evaluation at the emergency room. Patient states that he feels okay, but if he feels worse he will have his wife take him to the ER.  Reiterated advice to get evaluated at ER at this time. Strict ER precautions reviewed.  Patient verbalized an understanding of recommendations.

## 2024-08-23 NOTE — Telephone Encounter (Signed)
 I called and spoke to pt based off of what he was scheduled for. Pt denies being on O2. Pt states he had a blood transfusion last Friday and he was discovered being anemic during chemo and radiation. Pt states this was for his RBC. Pt states he checks his o2 levels during his s.o.b and states it will usually stay between 95-96. Pt states he does have a cough but he has had this since radiation and this is usually early in the morning.    2.14 RBC last checked and was not rechecked again.

## 2024-08-23 NOTE — Progress Notes (Signed)
 Please let patient know CXR showed consolidation in right upper and lower lung, could be fluid or a pneumonia. Waiting for his labs to come back regarding next steps.   Please ask him if he has a cough or fever? I believe he denied this during our visit today. I suspect more fluid related than an infection. May need procedure to drain that fluid which could help his shortness of breath

## 2024-08-23 NOTE — Telephone Encounter (Signed)
 Pt. Has Oxygen concerns. Message on 08/20/24 from pt. emailed to [facility] to follow up.

## 2024-08-24 ENCOUNTER — Telehealth: Payer: Self-pay | Admitting: Primary Care

## 2024-08-24 NOTE — Telephone Encounter (Signed)
 Per Avelina from [employer]- Sats are needed, co-signed also please

## 2024-08-24 NOTE — Telephone Encounter (Signed)
 Signed.

## 2024-08-25 NOTE — Progress Notes (Signed)
 Pt returned call - spoke to pt and advised of CBC results. Pt verbalized understanding, NFN

## 2024-08-25 NOTE — Progress Notes (Signed)
 Please let patient know I had Dr. Shelah review his CT scan, fluid is loculated superiorly and felt to be due to failure of right lower lung to expand. He did not think we should mess with it. We can continue to monitor and if effusion is getting bigger would refer to IR to see if they could drain.   No signs of active infections, I agree related to radiation. Try delsym  OTC cough syrup and/or we can send in tesslon perles if he'd like for cough

## 2024-08-25 NOTE — Progress Notes (Signed)
 ATC X1. LVMTCB

## 2024-08-25 NOTE — Progress Notes (Signed)
 Called and spoke to pt. Pt denies any fever but says that he does have a cough, that his attributes to his radiation and states that it began in May. Advised of CXR results.

## 2024-08-26 NOTE — Progress Notes (Signed)
 ATCx2 LVMTCB. Sending pt MyChart message as he was unable to be reached. NFN.

## 2024-09-05 NOTE — Progress Notes (Signed)
 " Electrophysiology Office Note:   Date:  09/06/2024  ID:  Peter [name], DOB 1946/07/24, MRN [id number]  Primary Cardiologist: Debby Sor, MD (Inactive) Primary Heart Failure: None Electrophysiologist: Swan Zayed Gladis Norton, MD      History of Present Illness:   Peter [name] is a 78 y.o. male with h/o coronary disease post PCI x 3 to the LAD, diastolic heart failure, hyperlipidemia, GERD, GI bleed, hypertension, CKD stage III, obesity, atrial fibrillation seen today for routine electrophysiology followup.   He is currently undergoing therapy for non-small cell lung cancer.  He was admitted to the hospital August 2025 with palpitations.  He had finished his chemotherapy.  After his final radiation treatment, he presented to the hospital.  He was noted to have heart rates between 130 and 170.  He converted to sinus rhythm.  It was thought that he was in atrial flutter.  It was thought this was triggered by his chemotherapy and radiation.  Discussed the use of AI scribe software for clinical note transcription with the patient, who gave verbal consent to proceed.  History of Present Illness Peter [name] is a 78 year old male who presents with episodes of increased heart rate.  He has a history of lung cancer located in the right lower lobe, discovered after a hospitalization for pneumonia and flu in February. He has never smoked. Following chemotherapy and radiation, the patient was told by his doctors that his intention was to contain the cancer, and that it was shrinking. He is currently undergoing immunotherapy with Volumac, receiving treatments every four weeks for a year. He experiences significant fatigue and reduced activity levels, unable to perform tasks like mowing the yard or washing the car.  In August, he experienced episodes of increased heart rate, with the first episode occurring after his last radiation treatment. His heart rate reached 170 bpm, resulting in an overnight  hospital stay. A second episode occurred on August 25th, with a heart rate of 144 bpm that spontaneously decreased to 95 bpm within 30 minutes. No further episodes have occurred since then. He is on carvedilol  25 mg twice daily and half a Lasix  in the morning, with good blood pressure control. Lisinopril  was discontinued after radiation treatment.  He reports episodes of coughing leading to breathlessness, including one incident where he passed out while sitting in a chair. He uses supplemental oxygen at home, which aids in quicker recovery from exertion. He describes difficulty breathing, stating 'I can't even go to the kitchen and back.' He has been diagnosed with borderline anemia, which worsened after radiation.  No recent episodes of increased heart rate since August 25th. Significant fatigue and reduced activity levels. Episodes of coughing leading to breathlessness and occasional passing out.    he denies chest pain, palpitations, dyspnea, PND, orthopnea, nausea, vomiting, dizziness, syncope, edema, weight gain, or early satiety.   Review of systems complete and found to be negative unless listed in HPI.   EP Information / Studies Reviewed:    EKG is not ordered today. EKG from 06/25/2024 reviewed which showed sinus rhythm, right bundle branch block        Risk Assessment/Calculations:    CHA2DS2-VASc Score = 5   This indicates a 7.2% annual risk of stroke. The patient's score is based upon: CHF History: 0 HTN History: 1 Diabetes History: 1 Stroke History: 0 Vascular Disease History: 1 Age Score: 2 Gender Score: 0            Physical Exam:  VS:  BP 126/80 (BP Location: Left Arm, Patient Position: Sitting, Cuff Size: Large)   Pulse 86   Ht 5' 8 (1.727 m)   Wt 210 lb 12.8 oz (95.6 kg)   SpO2 95%   BMI 32.05 kg/m    Wt Readings from Last 3 Encounters:  09/06/24 210 lb 12.8 oz (95.6 kg)  08/23/24 212 lb (96.2 kg)  08/06/24 207 lb (93.9 kg)     GEN: Well nourished,  well developed in no acute distress NECK: No JVD; No carotid bruits CARDIAC: Regular rate and rhythm, no murmurs, rubs, gallops RESPIRATORY:  Clear to auscultation without rales, wheezing or rhonchi  ABDOMEN: Soft, non-tender, non-distended EXTREMITIES:  No edema; No deformity   ASSESSMENT AND PLAN:    1.  Persistent atrial fibrillation: Post ablation 10/24/2016.  On carvedilol .  He had a recurrence of atrial flutter versus SVT.  This occurred while he was just after radiation therapy.  He had 1 further recurrence a few weeks later.  He is feeling well now.  Loni Abdon continue with current management.  2.  Secondary hypercoagulable state: On Xarelto   3.  Likely nonischemic cardiomyopathy: Ejection fraction improved with maintenance of sinus rhythm  4.  Hypertension: Well-controlled  5.  Coronary artery disease: Post PCI in 2006.  Remains asymptomatic.  Plan per primary cardiology.  6.  Obstructive sleep apnea: BiPAP compliance encouraged  Follow up with EP Team in 6 months  Signed, Lissette Schenk Gladis Norton, MD  "

## 2024-09-06 ENCOUNTER — Ambulatory Visit: Attending: Cardiology | Admitting: Cardiology

## 2024-09-06 ENCOUNTER — Encounter: Payer: Self-pay | Admitting: Cardiology

## 2024-09-06 VITALS — BP 126/80 | HR 86 | Ht 68.0 in | Wt 210.8 lb

## 2024-09-06 DIAGNOSIS — G4733 Obstructive sleep apnea (adult) (pediatric): Secondary | ICD-10-CM

## 2024-09-06 DIAGNOSIS — D6869 Other thrombophilia: Secondary | ICD-10-CM | POA: Diagnosis not present

## 2024-09-06 DIAGNOSIS — I10 Essential (primary) hypertension: Secondary | ICD-10-CM

## 2024-09-06 DIAGNOSIS — I4819 Other persistent atrial fibrillation: Secondary | ICD-10-CM | POA: Diagnosis not present

## 2024-09-08 ENCOUNTER — Inpatient Hospital Stay

## 2024-09-08 ENCOUNTER — Inpatient Hospital Stay: Admitting: Internal Medicine

## 2024-09-08 VITALS — BP 129/67 | HR 94 | Temp 97.8°F | Resp 17 | Ht 68.0 in | Wt 212.0 lb

## 2024-09-08 DIAGNOSIS — C3431 Malignant neoplasm of lower lobe, right bronchus or lung: Secondary | ICD-10-CM

## 2024-09-08 DIAGNOSIS — Z5112 Encounter for antineoplastic immunotherapy: Secondary | ICD-10-CM | POA: Diagnosis not present

## 2024-09-08 LAB — SAMPLE TO BLOOD BANK

## 2024-09-08 LAB — CBC WITH DIFFERENTIAL (CANCER CENTER ONLY)
Abs Immature Granulocytes: 0.02 K/uL (ref 0.00–0.07)
Basophils Absolute: 0 K/uL (ref 0.0–0.1)
Basophils Relative: 0 %
Eosinophils Absolute: 0 K/uL (ref 0.0–0.5)
Eosinophils Relative: 0 %
HCT: 29.5 % — ABNORMAL LOW (ref 39.0–52.0)
Hemoglobin: 9.8 g/dL — ABNORMAL LOW (ref 13.0–17.0)
Immature Granulocytes: 0 %
Lymphocytes Relative: 7 %
Lymphs Abs: 0.5 K/uL — ABNORMAL LOW (ref 0.7–4.0)
MCH: 34.1 pg — ABNORMAL HIGH (ref 26.0–34.0)
MCHC: 33.2 g/dL (ref 30.0–36.0)
MCV: 102.8 fL — ABNORMAL HIGH (ref 80.0–100.0)
Monocytes Absolute: 0.5 K/uL (ref 0.1–1.0)
Monocytes Relative: 7 %
Neutro Abs: 6.4 K/uL (ref 1.7–7.7)
Neutrophils Relative %: 86 %
Platelet Count: 149 K/uL — ABNORMAL LOW (ref 150–400)
RBC: 2.87 MIL/uL — ABNORMAL LOW (ref 4.22–5.81)
RDW: 15.4 % (ref 11.5–15.5)
WBC Count: 7.4 K/uL (ref 4.0–10.5)
nRBC: 0 % (ref 0.0–0.2)

## 2024-09-08 LAB — CMP (CANCER CENTER ONLY)
ALT: 13 U/L (ref 0–44)
AST: 18 U/L (ref 15–41)
Albumin: 3.7 g/dL (ref 3.5–5.0)
Alkaline Phosphatase: 107 U/L (ref 38–126)
Anion gap: 8 (ref 5–15)
BUN: 20 mg/dL (ref 8–23)
CO2: 25 mmol/L (ref 22–32)
Calcium: 9.4 mg/dL (ref 8.9–10.3)
Chloride: 108 mmol/L (ref 98–111)
Creatinine: 1.15 mg/dL (ref 0.61–1.24)
GFR, Estimated: >60 mL/min (ref >60)
Glucose, Bld: 156 mg/dL — ABNORMAL HIGH (ref 70–99)
Potassium: 4.4 mmol/L (ref 3.5–5.1)
Sodium: 141 mmol/L (ref 135–145)
Total Bilirubin: 0.6 mg/dL (ref 0.0–1.2)
Total Protein: 6.8 g/dL (ref 6.5–8.1)

## 2024-09-08 LAB — TSH: TSH: 0.924 u[IU]/mL (ref 0.350–4.500)

## 2024-09-08 MED ORDER — SODIUM CHLORIDE 0.9 % IV SOLN
1500.0000 mg | Freq: Once | INTRAVENOUS | Status: AC
  Administered 2024-09-08: 1500 mg via INTRAVENOUS
  Filled 2024-09-08: qty 30

## 2024-09-08 MED ORDER — SODIUM CHLORIDE 0.9 % IV SOLN: INTRAVENOUS | Status: DC

## 2024-09-08 NOTE — Patient Instructions (Signed)
 VISIT SUMMARY:  You came in today for an evaluation before starting your second cycle of consolidation immunotherapy for stage IIIA non-small cell lung cancer. You have completed one cycle of durvalumab without any issues. We also discussed your anemia and chronic respiratory failure, both of which are being managed with ongoing treatments.  YOUR PLAN:  -STAGE IIIA NON-SMALL CELL LUNG CANCER: Stage IIIA non-small cell lung cancer is a type of lung cancer that has spread to nearby lymph nodes. You are currently receiving consolidation immunotherapy with durvalumab 1500 mg IV every four weeks. You have completed one cycle without any issues. We will continue this treatment and schedule a follow-up in four weeks to evaluate your progress before the next cycle.  -ANEMIA SECONDARY TO CANCER THERAPY: Anemia is a condition where you have a lower than normal number of red blood cells, which can cause fatigue. Your current hemoglobin level is 9.8 g/dL, which is an improvement from previous levels. You should continue taking your daily iron supplements and consider taking them with orange juice or vitamin C to help your body absorb the iron better.  -CHRONIC HYPOXEMIC RESPIRATORY FAILURE: Chronic hypoxemic respiratory failure is a condition where your blood oxygen levels are lower than normal, requiring you to use home oxygen therapy. You are currently using 1.5 liters of oxygen, and your oxygen levels improve with rest. This condition is being managed by your pulmonologist, Dr. Ruther.  INSTRUCTIONS:  Please schedule a follow-up appointment in four weeks for evaluation before your next cycle of durvalumab.

## 2024-09-08 NOTE — Patient Instructions (Signed)

## 2024-09-08 NOTE — Progress Notes (Signed)
 [facility] Telephone:([phone]) [phone]   [phone]  OFFICE PROGRESS NOTE  Koirala, [name], MD [address] [place] [address]  DIAGNOSIS: Stage IIIA (T4, N1, M0) non-small cell lung cancer, adenocarcinoma presented with right lower lobe lung mass in addition to suspicious right upper lobe lesion and right hilar lymphadenopathy diagnosed in April 2025.   Biomarker Findings HRD signature - Cannot Be Determined Microsatellite status - Cannot Be Determined ? Tumor Mutational Burden - Cannot Be Determined Genomic Findings For a complete list of the genes assayed, please refer to the Appendix. AKT2 amplification CDKN2A p16INK4a R80* and p14ARF P94L KRAS G12D ASXL1 W796fs*22 GNAS R201H U2AF1 S34F 7 Disease relevant genes with no reportable alterations: ALK, BRAF, EGFR, ERBB2, MET, RET, ROS1  PDL1 TPS: 0%  PRIOR THERAPY:  1) Neoadjuvant systemic chemotherapy with carboplatin  for AUC of 5, Alimta  500 Mg/M2 and Keytruda  200 Mg IV every 3 weeks.  Status post 1 cycle.  This was discontinued secondary to intolerance. 2) A course of concurrent chemoradiation with weekly carboplatin  for AUC of 2 and paclitaxel  45 Mg/M2.  First dose dose 32,025.  Status post 7 cycles.  Last dose was given 06/21/2024.  CURRENT THERAPY: Consideration of consolidation treatment with immunotherapy with durvalumab 1500 mg IV every 4 weeks.  First dose was given on August 11, 2024.  INTERVAL HISTORY: Peter [name] 78 y.o. male returns to the clinic today for follow-up visit. Discussed the use of AI scribe software for clinical note transcription with the patient, who gave verbal consent to proceed.  History of Present Illness Peter [name] is a 78 year old male with stage IIIA non-small cell lung cancer who presents for evaluation before starting his second cycle of consolidation immunotherapy.  He was diagnosed with stage IIIA non-small cell lung cancer in April  2025. Initial treatment included one cycle of neoadjuvant chemoimmunotherapy, which was discontinued due to intolerance. He then completed concurrent chemoradiation with weekly carboplatin  and paclitaxel . Currently, he is on consolidation treatment with durvalumab 1500 mg IV every four weeks and has completed one cycle without issues.  He experiences no adverse effects from the first cycle of durvalumab, unlike previous treatments where he developed a rash. He is on home oxygen therapy at 1.5 liters per minute, which he has been using for three weeks. He reports that during a previous visit, his blood oxygen level was measured at 89% but increased to 95% after sitting for a minute.  He notes significant fatigue, particularly when showering, which he attributes to anemia. He has a history of anemia, with a current hemoglobin level of 9.8 g/dL, and takes an iron tablet daily. His hemoglobin was previously as low as 7 g/dL, indicating some improvement.    MEDICAL HISTORY: Past Medical History:  Diagnosis Date   Arthritis    At risk for sleep apnea    STOP-BANG= 5       SENT TO PCP 07-12-2015   CAD (coronary artery disease)    a. s/p PCI with DES x 3 to LAD in 2006 with bifurcation lesion and Plavix  indefinitely was recommended.   Chronic combined systolic and diastolic CHF (congestive heart failure) (HCC)    a. Prior EF normal but EF dropped to 30-35% in 07/2016 in setting of AFIB.   CKD (chronic kidney disease), stage III (HCC)    Depression    Dyslipidemia    Dyspnea    tired quick d/t Afib   Dyspnea  on exertion    Dysrhythmia    A.FIB   GERD (gastroesophageal reflux disease)    History of GI bleed    History of kidney stones    History of MI (myocardial infarction)    11-05-2005--  periprocedural MI  (cardiac cath) per dr obie note   History of radiation therapy    Right lung-05/11/24-06/25/24- Dr. Lynwood Nasuti   Hypertension    Hypertriglyceridemia    Iron deficiency anemia     Myocardial infarction [facility])    Persistent atrial fibrillation (HCC)    a. 07/2016, started on Eliquis  -> readmitted later that month for AF RVR/acute HF - initially unsuccessful DCCV following TEE, so loaded with amio with repeat successful DCCV 07/2816.   Right ureteral stone    S/P drug eluting coronary stent placement    x3  to LAD  2006   Sleep apnea    wears BIPAP, 14 up to 24    ALLERGIES:  is allergic to prednisone  and spironolactone .  MEDICATIONS:  Current Outpatient Medications  Medication Sig Dispense Refill   albuterol  (VENTOLIN  HFA) 108 (90 Base) MCG/ACT inhaler Inhale 2 puffs into the lungs every 6 (six) hours as needed for wheezing or shortness of breath (intractable cough). (Patient not taking: Reported on 09/06/2024) 8 g 2   allopurinol  (ZYLOPRIM ) 100 MG tablet Take 100 mg by mouth at bedtime.     allopurinol  (ZYLOPRIM ) 300 MG tablet Take 300 mg by mouth in the morning.     atorvastatin  (LIPITOR) 40 MG tablet TAKE 1 TABLET EVERY DAY (NEED MD APPOINTMENT, CALL [phone]) 90 tablet 1   budeson-glycopyrrolate-formoterol (BREZTRI  AEROSPHERE) 160-9-4.8 MCG/ACT AERO Inhale 2 puffs into the lungs in the morning and at bedtime. (Patient not taking: Reported on 09/06/2024)     carvedilol  (COREG ) 25 MG tablet Take 1 tablet (25 mg total) by mouth 2 (two) times daily with a meal. 180 tablet 0   Cyanocobalamin  (VITAMIN B-12 PO) Take 1 tablet by mouth daily.     diphenhydramine -acetaminophen  (TYLENOL  PM) 25-500 MG TABS tablet Take 1 tablet by mouth at bedtime.     ferrous sulfate  325 (65 FE) MG tablet Take 1 tablet (325 mg total) by mouth every morning. 90 tablet 2   folic acid  (FOLVITE ) 1 MG tablet Take 1 mg by mouth daily.     furosemide  (LASIX ) 40 MG tablet Take 1 tablet (40 mg total) by mouth daily. 90 tablet 0   hydrocortisone  1 % lotion Apply 1 Application topically 2 (two) times daily. 118 mL 0   [Paused] lisinopril  (ZESTRIL ) 40 MG tablet TAKE 1 TABLET EVERY DAY 90 tablet 3    nitroGLYCERIN  (NITROSTAT ) 0.4 MG SL tablet Place 1 tablet (0.4 mg total) under the tongue every 5 (five) minutes as needed for chest pain. X 3 doses 25 tablet 5   pantoprazole  (PROTONIX ) 40 MG tablet Take 40 mg by mouth daily.     polyvinyl alcohol  (LIQUIFILM TEARS) 1.4 % ophthalmic solution Place 1 drop into both eyes daily as needed for dry eyes.     rivaroxaban  (XARELTO ) 20 MG TABS tablet Take 1 tablet (20 mg total) by mouth at bedtime. Okay to restart this medication on 02/10/24 90 tablet 1   No current facility-administered medications for this visit.    SURGICAL HISTORY:  Past Surgical History:  Procedure Laterality Date   BRONCHIAL BIOPSY  02/09/2024   Procedure: BRONCHOSCOPY, WITH BIOPSY;  Surgeon: Shelah Lamar RAMAN, MD;  Location: [facility] ENDOSCOPY;  Service: Pulmonary;;   BRONCHIAL  BRUSHINGS  02/09/2024   Procedure: BRONCHOSCOPY, WITH BRUSH BIOPSY;  Surgeon: Shelah Lamar RAMAN, MD;  Location: [facility] ENDOSCOPY;  Service: Pulmonary;;   BRONCHIAL NEEDLE ASPIRATION BIOPSY  02/09/2024   Procedure: BRONCHOSCOPY, WITH NEEDLE ASPIRATION BIOPSY;  Surgeon: Shelah Lamar RAMAN, MD;  Location: [facility] ENDOSCOPY;  Service: Pulmonary;;   BRONCHIAL WASHINGS  02/09/2024   Procedure: IRRIGATION, BRONCHUS;  Surgeon: Shelah Lamar RAMAN, MD;  Location: [facility] ENDOSCOPY;  Service: Pulmonary;;   CARDIOVASCULAR STRESS TEST  01-06-2013  dr micky   normal perfusion study/  no ischemia or infarct/  normal LV function and wall motion , ef 57%   CARDIOVERSION N/A 08/05/2016   Procedure: CARDIOVERSION;  Surgeon: Jerel Balding, MD;  Location: [facility] ENDOSCOPY;  Service: Cardiovascular;  Laterality: N/A;   CARDIOVERSION N/A 08/08/2016   Procedure: CARDIOVERSION;  Surgeon: Oneil JAYSON Parchment, MD;  Location: [facility] ENDOSCOPY;  Service: Cardiovascular;  Laterality: N/A;   CORONARY ANGIOPLASTY  11-13-2005 dr obie   Successful touch up post-dilatation of the 3 tandem overlying Cypher stents in proximal to mid LAD (based on IVUS 0% stenosis and patent diagonal  branch)/  Cutting Balloon Angioplasty of Ramus branch   CORONARY ANGIOPLASTY WITH STENT PLACEMENT  11-05-2005   dr wolm brodie   PCI and DES x3 to birfurcation lesion LAD and diagonal branch of LAD/   20% dLM,  30-40% RCA, ef 60%   CYSTOSCOPY WITH RETROGRADE PYELOGRAM, URETEROSCOPY AND STENT PLACEMENT Right 07/14/2015   Procedure: CYSTOSCOPY WITH RETROGRADE PYELOGRAM, URETEROSCOPY AND STENT PLACEMENT;  Surgeon: Ricardo Likens, MD;  Location: [facility];  Service: Urology;  Laterality: Right;   ELECTROPHYSIOLOGIC STUDY N/A 10/24/2016   Procedure: Atrial Fibrillation Ablation;  Surgeon: Will Gladis Norton, MD;  Location: [facility] INVASIVE CV LAB;  Service: Cardiovascular;  Laterality: N/A;   EXTRACORPOREAL SHOCK WAVE LITHOTRIPSY  yrs ago   HEMORRHOID SURGERY N/A 10/24/2017   Procedure: HEMORRHOIDECTOMY;  Surgeon: Teresa Lonni HERO, MD;  Location: [facility] OR;  Service: General;  Laterality: N/A;   HERNIA REPAIR Bilateral 2000   Inguinal Hernia   IR GENERIC HISTORICAL  10/23/2016   IR FLUORO GUIDE CV LINE RIGHT 10/23/2016 Ozell Specking, MD [facility]-INTERV RAD   IR GENERIC HISTORICAL  10/23/2016   IR US  GUIDE VASC ACCESS RIGHT 10/23/2016 Ozell Specking, MD [facility]   LEFT URETEROSCOPIC STONE EXTRACTION  04/10/2006   STONE EXTRACTION WITH BASKET Right 07/14/2015   Procedure: STONE EXTRACTION WITH BASKET;  Surgeon: Ricardo Likens, MD;  Location[facility];  Service: Urology;  Laterality: Right;   TEE WITHOUT CARDIOVERSION N/A 08/05/2016   Procedure: TRANSESOPHAGEAL ECHOCARDIOGRAM (TEE);  Surgeon: Jerel Balding, MD;  Location: [facility] ENDOSCOPY;  Service: Cardiovascular;  Laterality: N/A;   TOTAL HIP ARTHROPLASTY Right 07/16/2022   Procedure: RIGHT TOTAL HIP ARTHROPLASTY ANTERIOR APPROACH;  Surgeon: Jerri Kay HERO, MD;  Location: [facility] OR;  Service: Orthopedics;  Laterality: Right;  3-C   TRANSTHORACIC ECHOCARDIOGRAM  02/04/2012   grade I diastolic dysfunction/  ef 55-60%/  trivial MR  and TR/  mild LAE   VIDEO BRONCHOSCOPY WITH ENDOBRONCHIAL NAVIGATION Right 02/09/2024   Procedure: VIDEO BRONCHOSCOPY WITH ENDOBRONCHIAL NAVIGATION;  Surgeon: Shelah Lamar RAMAN, MD;  Location: [facility] ENDOSCOPY;  Service: Pulmonary;  Laterality: Right;  WITH FLOURO    REVIEW OF SYSTEMS:  A comprehensive review of systems was negative except for: Constitutional: positive for fatigue Respiratory: positive for dyspnea on exertion   PHYSICAL EXAMINATION: General appearance: alert, cooperative, fatigued, and no distress Head: Normocephalic, without obvious abnormality, atraumatic Neck:  no adenopathy, no JVD, supple, symmetrical, trachea midline, and thyroid  not enlarged, symmetric, no tenderness/mass/nodules Lymph nodes: Cervical, supraclavicular, and axillary nodes normal. Resp: clear to auscultation bilaterally Back: symmetric, no curvature. ROM normal. No CVA tenderness. Cardio: regular rate and rhythm, S1, S2 normal, no murmur, click, rub or gallop GI: soft, non-tender; bowel sounds normal; no masses,  no organomegaly Extremities: extremities normal, atraumatic, no cyanosis or edema  ECOG PERFORMANCE STATUS: 1 - Symptomatic but completely ambulatory  Blood pressure 129/67, pulse 94, temperature 97.8 F (36.6 C), temperature source Temporal, resp. rate 17, height 5' 8 (1.727 m), weight 212 lb (96.2 kg), SpO2 95%.  LABORATORY DATA: Lab Results  Component Value Date   WBC 7.4 09/08/2024   HGB 9.8 (L) 09/08/2024   HCT 29.5 (L) 09/08/2024   MCV 102.8 (H) 09/08/2024   PLT 149 (L) 09/08/2024      Chemistry      Component Value Date/Time   NA 141 08/11/2024 1304   K 4.1 08/11/2024 1304   CL 110 08/11/2024 1304   CO2 24 08/11/2024 1304   BUN 24 (H) 08/11/2024 1304   CREATININE 1.04 08/11/2024 1304   CREATININE 1.72 (H) 10/16/2016 1115      Component Value Date/Time   CALCIUM  8.7 (L) 08/11/2024 1304   ALKPHOS 94 08/11/2024 1304   AST 16 08/11/2024 1304   ALT 9 08/11/2024 1304   BILITOT  0.5 08/11/2024 1304       RADIOGRAPHIC STUDIES: DG Chest 2 View Result Date: 08/23/2024 EXAM: 2 VIEW(S) XRAY OF THE CHEST 08/23/2024 02:59:45 PM COMPARISON: None available. CLINICAL HISTORY: Hx adenocarcinoma right lower lung; wheezing heard RUL on exam and hypoxemic. FINDINGS: LUNGS AND PLEURA: There is a loculated right upper lobe pleural effusion which appears similar to the prior study. There are new right upper lobe and right lower lobe focal airspace consolidations. No pulmonary edema. No pneumothorax. HEART AND MEDIASTINUM: No acute abnormality of the cardiac and mediastinal silhouettes. BONES AND SOFT TISSUES: No acute osseous abnormality. IMPRESSION: 1. New right upper lobe and right lower lobe focal airspace consolidations. 2. Loculated right upper lobe pleural effusion, similar to prior. Electronically signed by: Greig Pique MD 08/23/2024 03:44 PM EDT RP Workstation: [id number]     ASSESSMENT AND PLAN: This is a very pleasant 78 years old white male with Stage IIIA (T4, N1, M0) non-small cell lung cancer, adenocarcinoma presented with right lower lobe lung mass in addition to suspicious right upper lobe lesion and right hilar lymphadenopathy diagnosed in April 2025.  Molecular studies showed no actionable mutations and PD-L1 expression was negative. The patient is currently undergoing neoadjuvant chemoimmunotherapy with carboplatin  AUC of 5, Alimta  500 Mg/M2 and Keytruda  200 Mg IV every 3 weeks status post 1 cycle.  He tolerated the first cycle of his treatment well except for mild fatigue and mild skin rash on the face. The patient has intolerance to the neoadjuvant chemoimmunotherapy and this was discontinued. He underwent a course of concurrent chemoradiation with weekly carboplatin  for AUC of 2 and paclitaxel  45 Mg/M2.  First dose expected May 10, 2024.  Status post 7 cycles.  He is currently undergoing consolidation treatment with immunotherapy with durvalumab 1500 mg IV every 4  weeks status post 1 cycle. He tolerated the first cycle of his treatment fairly well with no significant adverse effects. Assessment and Plan Assessment & Plan Stage IIIA non-small cell lung cancer, status post chemoradiation and currently receiving consolidation immunotherapy Stage IIIA non-small cell lung cancer, initially diagnosed in  April 2025. No actionable mutation and negative PD-L1 expression. Previously intolerant to neoadjuvant chemoimmunotherapy. Completed concurrent chemoradiation with weekly carboplatin  and paclitaxel . Currently on consolidation immunotherapy with durvalumab 1500 mg IV every four weeks, status post one cycle. No issues reported with the first cycle of durvalumab. Breathing issues persist, requiring home oxygen therapy. 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 Previously as low as 7 g/dL. Symptoms include fatigue, particularly after activities such as showering. Currently taking daily iron supplements. - Continue daily iron supplementation - Advise taking iron with orange juice or vitamin C to enhance absorption  Chronic hypoxemic respiratory failure requiring home oxygen Chronic hypoxemic respiratory failure requiring home oxygen therapy for the past three weeks. Currently on 1.5 liters of oxygen. Oxygen saturation was 89% upon arrival but improved to 95% after rest. Managed by pulmonologist Dr. Shelah.  Follow-Up - Schedule follow-up appointment in four weeks He was advised to call immediately if he has any other concerning symptoms in the interval. The patient voices understanding of current disease status and treatment options and is in agreement with the current care plan.  All questions were answered. The patient knows to call the clinic with any problems, questions or concerns. We can certainly see the  patient much sooner if necessary.  The total time spent in the appointment was 20 minutes.  Disclaimer: This note was dictated with voice recognition software. Similar sounding words can inadvertently be transcribed and may not be corrected upon review.

## 2024-09-09 ENCOUNTER — Other Ambulatory Visit: Payer: Self-pay

## 2024-09-09 LAB — T4: T4, Total: 5.9 ug/dL (ref 4.5–12.0)

## 2024-09-11 ENCOUNTER — Other Ambulatory Visit: Payer: Self-pay

## 2024-09-19 ENCOUNTER — Other Ambulatory Visit: Payer: Self-pay

## 2024-09-20 DIAGNOSIS — L821 Other seborrheic keratosis: Secondary | ICD-10-CM | POA: Diagnosis not present

## 2024-09-20 DIAGNOSIS — D0462 Carcinoma in situ of skin of left upper limb, including shoulder: Secondary | ICD-10-CM | POA: Diagnosis not present

## 2024-09-20 DIAGNOSIS — D485 Neoplasm of uncertain behavior of skin: Secondary | ICD-10-CM | POA: Diagnosis not present

## 2024-09-20 DIAGNOSIS — C44519 Basal cell carcinoma of skin of other part of trunk: Secondary | ICD-10-CM | POA: Diagnosis not present

## 2024-09-20 DIAGNOSIS — Z85828 Personal history of other malignant neoplasm of skin: Secondary | ICD-10-CM | POA: Diagnosis not present

## 2024-09-21 ENCOUNTER — Ambulatory Visit: Admitting: Acute Care

## 2024-09-21 ENCOUNTER — Encounter: Payer: Self-pay | Admitting: Acute Care

## 2024-09-21 ENCOUNTER — Telehealth: Payer: Self-pay | Admitting: Acute Care

## 2024-09-21 VITALS — BP 130/80 | HR 86 | Temp 97.7°F | Ht 68.0 in | Wt 212.4 lb

## 2024-09-21 DIAGNOSIS — J849 Interstitial pulmonary disease, unspecified: Secondary | ICD-10-CM

## 2024-09-21 DIAGNOSIS — D6481 Anemia due to antineoplastic chemotherapy: Secondary | ICD-10-CM

## 2024-09-21 DIAGNOSIS — J9611 Chronic respiratory failure with hypoxia: Secondary | ICD-10-CM | POA: Diagnosis not present

## 2024-09-21 DIAGNOSIS — R0600 Dyspnea, unspecified: Secondary | ICD-10-CM

## 2024-09-21 DIAGNOSIS — J8410 Pulmonary fibrosis, unspecified: Secondary | ICD-10-CM

## 2024-09-21 DIAGNOSIS — J984 Other disorders of lung: Secondary | ICD-10-CM

## 2024-09-21 DIAGNOSIS — R9389 Abnormal findings on diagnostic imaging of other specified body structures: Secondary | ICD-10-CM

## 2024-09-21 DIAGNOSIS — R942 Abnormal results of pulmonary function studies: Secondary | ICD-10-CM

## 2024-09-21 DIAGNOSIS — R5383 Other fatigue: Secondary | ICD-10-CM

## 2024-09-21 DIAGNOSIS — R053 Chronic cough: Secondary | ICD-10-CM | POA: Diagnosis not present

## 2024-09-21 DIAGNOSIS — D649 Anemia, unspecified: Secondary | ICD-10-CM

## 2024-09-21 DIAGNOSIS — R911 Solitary pulmonary nodule: Secondary | ICD-10-CM

## 2024-09-21 LAB — CBC WITH DIFFERENTIAL/PLATELET
Basophils Absolute: 0 K/uL (ref 0.0–0.1)
Basophils Relative: 0.1 % (ref 0.0–3.0)
Eosinophils Absolute: 0 K/uL (ref 0.0–0.7)
Eosinophils Relative: 0 % (ref 0.0–5.0)
HCT: 30.8 % — ABNORMAL LOW (ref 39.0–52.0)
Hemoglobin: 10.1 g/dL — ABNORMAL LOW (ref 13.0–17.0)
Lymphocytes Relative: 8 % — ABNORMAL LOW (ref 12.0–46.0)
Lymphs Abs: 0.5 K/uL — ABNORMAL LOW (ref 0.7–4.0)
MCHC: 32.7 g/dL (ref 30.0–36.0)
MCV: 104.5 fl — ABNORMAL HIGH (ref 78.0–100.0)
Monocytes Absolute: 0.5 K/uL (ref 0.1–1.0)
Monocytes Relative: 7.4 % (ref 3.0–12.0)
Neutro Abs: 5.3 K/uL (ref 1.4–7.7)
Neutrophils Relative %: 84.5 % — ABNORMAL HIGH (ref 43.0–77.0)
Platelets: 143 K/uL — ABNORMAL LOW (ref 150.0–400.0)
RBC: 2.95 Mil/uL — ABNORMAL LOW (ref 4.22–5.81)
RDW: 16.4 % — ABNORMAL HIGH (ref 11.5–15.5)
WBC: 6.2 K/uL (ref 4.0–10.5)

## 2024-09-21 NOTE — Patient Instructions (Addendum)
 It is good to see you today. We will do a CBC today to make sure your HGB is stable. We will call you with results.  Try to eat more than one meal a day. Continue your protein supplements.  We will order PFT's  You will get a call to get this scheduled.  We will place an order to pulmonary rehab.  You will get a call to get this scheduled.  Please continue iron tablets , take with Vitamin C to help with absorption.  Continue wearing oxygen at 2 L [place].  Maintain oxygen saturations 88% or more at all times.  Handicap Placard for another 6 months . We will walk you today to see if you maintain oxygen levels on the portable oxygen . If you do, we will place the order.  Follow up in 4 weeks or as needed.  Please contact office for sooner follow up if symptoms do not improve or worsen or seek emergency care.

## 2024-09-21 NOTE — Progress Notes (Signed)
 History of Present Illness Peter [name] is a 78 y.o. male with stage IIIA non-small cell lung cancer . He is a patient of Dr. Shelah   09/21/2024 Discussed the use of AI scribe software for clinical note transcription with the patient, who gave verbal consent to proceed.  History of Present Illness Peter [name] is a 78 year old male with stage IIIA non-small cell lung cancer, anemia and chronic hypoxic respiratory failure  and OSA on CPAP who presents with worsening shortness of breath and fatigue.  He was diagnosed with stage IIIA non-small cell lung cancer in April 2025. Initial treatment included one cycle of neoadjuvant chemoimmunotherapy, which was discontinued due to intolerance. He then completed concurrent chemoradiation with weekly carboplatin  and paclitaxel . Currently, he is on consolidation treatment with durvalumab 1500 mg IV every four weeks and has completed one cycle without issues.   He has had anemia most likely as a result of his cancer treatment. He did require a blood transfusion 08/13/2024.  Hemoglobin levels dropping as low as  7.8 and red blood cell count decreasing to 2.1 prior to a blood transfusion . Post-transfusion, his hemoglobin increased to 9.8 and red blood cell count to 3.1, but these levels have since decreased again. He is currently on durvalumab, started on October 1st, administered every four weeks for a year. He experiences significant fatigue and shortness of breath, which he attributes to low hemoglobin and red blood cell counts.  He requires supplemental oxygen at home due to low blood oxygen levels. This was started after a 08/23/2024 OV for dyspnea. He is wearing this at 2-3 L with exertion and 1.5 L at rest. He uses a large oxygen tank at home with a fifty-foot hose and reports difficulty walking even short distances without significant drops in blood oxygen levels, which fall to 82-83%. He does not have a portable oxygen tank and struggles with  mobility due to the weight of his current equipment. He experiences severe fatigue, stating he can barely walk from the bedroom to the kitchen and often needs to rest after minimal exertion. We will walk him today to see if he qualifies for a POC.   He has a history of pulmonary fibrosis per CT scan was performed on July 29, 2024. He reports a persistent dry cough, primarily in the morning and at night, with minimal sputum production. He has tried various over-the-counter remedies without significant relief.  He reports significant fatigue and deconditioning, stating he spends most of his day in a recliner and struggles with basic activities. He has experienced weight fluctuations, currently weighing around 211 pounds, and reports eating one meal a day, supplemented by protein drinks every two to three days. He acknowledges a lack of appetite and difficulty consuming full meals.He is currently only eating 1 meal per day per his wife.   He is taking supplemental iron daily but has not been consistent with vitamin C intake to enhance absorption.He acknowledges the importance of nutrition but struggles with maintaining a regular eating schedule.He may need to see the dietitian through oncology if his appetite does not improve.  We will check a CBC today, as well as order PFT's to evaluate his DLCO  Consider HRCT to eval fibrotic changes Consider an echo to evaluate for PAH as reason for worsening dyspnea.  Test Results: Walked today on POC, maintained sats on 3 L [place] while ambulating.  Vitals:   09/21/24 1141 09/21/24 1142 09/21/24 1143 09/21/24 1144  BP:  Pulse:      Temp:      Height:      Weight:      SpO2: 93% Comment: On Room Air 93% Comment: On Room Air at Rest (!) 87% Comment: On Room Air while Ambulating 92% Comment: On 3L POC while Ambulating  TempSrc:      BMI (Calculated):           Latest Ref Rng & Units 09/08/2024   10:22 AM 08/23/2024    2:59 PM 08/11/2024    1:04  PM  CBC  WBC 4.0 - 10.5 K/uL 7.4  7.5  5.0   Hemoglobin 13.0 - 17.0 g/dL 9.8  88.9  7.8   Hematocrit 39.0 - 52.0 % 29.5  33.5  24.0   Platelets 150 - 400 K/uL 149  175.0  143        Latest Ref Rng & Units 09/08/2024   10:22 AM 08/11/2024    1:04 PM 07/22/2024   12:12 PM  BMP  Glucose 70 - 99 mg/dL 843  835  853   BUN 8 - 23 mg/dL 20  24  18    Creatinine 0.61 - 1.24 mg/dL 8.84  8.95  8.95   Sodium 135 - 145 mmol/L 141  141  144   Potassium 3.5 - 5.1 mmol/L 4.4  4.1  4.4   Chloride 98 - 111 mmol/L 108  110  110   CO2 22 - 32 mmol/L 25  24  28    Calcium  8.9 - 10.3 mg/dL 9.4  8.7  9.0     BNP    Component Value Date/Time   BNP 416.8 (H) 01/23/2024 0220    ProBNP    Component Value Date/Time   PROBNP 144.7 (H) 02/03/2012 1854    PFT    Component Value Date/Time   FEV1PRE 1.13 03/05/2024 1100   FEV1POST 1.24 03/05/2024 1100   FVCPRE 1.24 03/05/2024 1100   FVCPOST 1.40 03/05/2024 1100   TLC 2.92 03/05/2024 1100   DLCOUNC 11.81 03/05/2024 1100   PREFEV1FVCRT 92 03/05/2024 1100   PSTFEV1FVCRT 89 03/05/2024 1100    DG Chest 2 View Result Date: 08/23/2024 EXAM: 2 VIEW(S) XRAY OF THE CHEST 08/23/2024 02:59:45 PM COMPARISON: None available. CLINICAL HISTORY: Hx adenocarcinoma right lower lung; wheezing heard RUL on exam and hypoxemic. FINDINGS: LUNGS AND PLEURA: There is a loculated right upper lobe pleural effusion which appears similar to the prior study. There are new right upper lobe and right lower lobe focal airspace consolidations. No pulmonary edema. No pneumothorax. HEART AND MEDIASTINUM: No acute abnormality of the cardiac and mediastinal silhouettes. BONES AND SOFT TISSUES: No acute osseous abnormality. IMPRESSION: 1. New right upper lobe and right lower lobe focal airspace consolidations. 2. Loculated right upper lobe pleural effusion, similar to prior. Electronically signed by: Greig Pique MD 08/23/2024 03:44 PM EDT RP Workstation: [id number]     Past medical  hx Past Medical History:  Diagnosis Date   Arthritis    At risk for sleep apnea    STOP-BANG= 5       SENT TO PCP 07-12-2015   CAD (coronary artery disease)    a. s/p PCI with DES x 3 to LAD in 2006 with bifurcation lesion and Plavix  indefinitely was recommended.   Chronic combined systolic and diastolic CHF (congestive heart failure) (HCC)    a. Prior EF normal but EF dropped to 30-35% in 07/2016 in setting of AFIB.   CKD (chronic kidney disease), stage III (HCC)  Depression    Dyslipidemia    Dyspnea    tired quick d/t Afib   Dyspnea on exertion    Dysrhythmia    A.FIB   GERD (gastroesophageal reflux disease)    History of GI bleed    History of kidney stones    History of MI (myocardial infarction)    11-05-2005--  periprocedural MI  (cardiac cath) per dr obie note   History of radiation therapy    Right lung-05/11/24-06/25/24- Dr. Lynwood Nasuti   Hypertension    Hypertriglyceridemia    Iron deficiency anemia    Myocardial infarction [facility])    Persistent atrial fibrillation (HCC)    a. 07/2016, started on Eliquis  -> readmitted later that month for AF RVR/acute HF - initially unsuccessful DCCV following TEE, so loaded with amio with repeat successful DCCV 07/2816.   Right ureteral stone    S/P drug eluting coronary stent placement    x3  to LAD  2006   Sleep apnea    wears BIPAP, 13 up to 23     Social History   Tobacco Use   Smoking status: Never    Passive exposure: Past   Smokeless tobacco: Never  Vaping Use   Vaping status: Never Used  Substance Use Topics   Alcohol  use: No    Comment: 1 beer a year   Drug use: No    Mr.Maye reports that he has never smoked. He has been exposed to tobacco smoke. He has never used smokeless tobacco. He reports that he does not drink alcohol  and does not use drugs.  Tobacco Cessation: Counseling given: Not Answered Never smoker   Past surgical hx, Family hx, Social hx all reviewed.  Current Outpatient Medications on  File Prior to Visit  Medication Sig   allopurinol  (ZYLOPRIM ) 100 MG tablet Take 100 mg by mouth at bedtime.   allopurinol  (ZYLOPRIM ) 300 MG tablet Take 300 mg by mouth in the morning.   atorvastatin  (LIPITOR) 40 MG tablet TAKE 1 TABLET EVERY DAY (NEED MD APPOINTMENT, CALL [phone])   carvedilol  (COREG ) 25 MG tablet Take 1 tablet (25 mg total) by mouth 2 (two) times daily with a meal.   Cyanocobalamin  (VITAMIN B-12 PO) Take 1 tablet by mouth daily.   diphenhydramine -acetaminophen  (TYLENOL  PM) 25-500 MG TABS tablet Take 1 tablet by mouth at bedtime.   ferrous sulfate  325 (65 FE) MG tablet Take 1 tablet (325 mg total) by mouth every morning.   furosemide  (LASIX ) 40 MG tablet Take 1 tablet (40 mg total) by mouth daily.   hydrocortisone  1 % lotion Apply 1 Application topically 2 (two) times daily.   nitroGLYCERIN  (NITROSTAT ) 0.4 MG SL tablet Place 1 tablet (0.4 mg total) under the tongue every 5 (five) minutes as needed for chest pain. X 3 doses   pantoprazole  (PROTONIX ) 40 MG tablet Take 40 mg by mouth daily.   polyvinyl alcohol  (LIQUIFILM TEARS) 1.4 % ophthalmic solution Place 1 drop into both eyes daily as needed for dry eyes.   rivaroxaban  (XARELTO ) 20 MG TABS tablet Take 1 tablet (20 mg total) by mouth at bedtime. Okay to restart this medication on 02/10/24   [Paused] lisinopril  (ZESTRIL ) 40 MG tablet TAKE 1 TABLET EVERY DAY (Patient not taking: Reported on 09/21/2024)   No current facility-administered medications on file prior to visit.     Allergies  Allergen Reactions   Prednisone  Other (See Comments)    Made BS increase to 500- discovered on 01/22/24 Cannot take dose pack  Spironolactone  Other (See Comments)    Tender breast    Review Of Systems:  Constitutional:   No  weight loss, night sweats,  Fevers, chills, ++fatigue, or  lassitude.  HEENT:   No headaches,  Difficulty swallowing,  Tooth/dental problems, or  Sore throat,                No sneezing, itching, ear ache,  nasal congestion, post nasal drip,   CV:  No chest pain,  Orthopnea, PND, swelling in lower extremities, anasarca, dizziness, palpitations, syncope.   GI  No heartburn, indigestion, abdominal pain, nausea, vomiting, diarrhea, change in bowel habits, ++loss of appetite, bloody stools.   Resp: + shortness of breath with exertion or at rest.  No excess mucus, no productive cough,  No non-productive cough,  No coughing up of blood.  No change in color of mucus.  + occasional  wheezing.  No chest wall deformity  Skin: no rash or lesions.  GU: no dysuria, change in color of urine, no urgency or frequency.  No flank pain, no hematuria   MS:  No joint pain or swelling.  + decreased range of motion.  No back pain.+ physical deconditioning.  Psych:  No change in mood or affect. No depression or anxiety.  No memory loss.   Vital Signs BP 130/80   Pulse 86   Temp 97.7 F (36.5 C) (Oral)   Ht 5' 8 (1.727 m)   Wt 212 lb 6.4 oz (96.3 kg)   SpO2 92% Comment: On 3L POC while Ambulating  BMI 32.30 kg/m    Physical Exam: Physical Exam VITALS: BP- 130/80 MEASUREMENTS: Weight- 211. GENERAL: No distress, alert and oriented times 3. EARS NOSE THROAT: No sinus tenderness, tympanic membranes clear, pale nasal mucosa, no oral exudate, no post nasal drip, no lymphadenopathy. CHEST: Crackles heard on auscultation, no wheeze, rales, dullness, no accessory muscle use, no nasal flaring, no sternal retractions. CARDIAC: S1, S2, regular rate and rhythm, no murmur. ABDOMINAL: Soft, non tender. ND, BS present, EXTREMITIES: No clubbing, cyanosis, edema. No obvious deformities. NEUROLOGICAL: Physical deconditioning,  Alert and oriented x 3, MAE x 4. SKIN: No rashes, warm and dry. No obvious skin lesions. PSYCHIATRIC: Normal mood and behavior.   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 - Continue iron supplementation with vitamin  C. - Follow up with oncology   Chronic hypoxic respiratory failure due to pulmonary fibrosis and severe restrictive lung disease Oxygen saturation drops with exertion, improves quickly with supplemental oxygen.  Severe restrictive disease and decreased diffusion capacity noted in April 2025 pulmonary function tests. Recent CT shows mild pulmonary fibrosis. - Ordered pulmonary function tests to reassess lung function. - Continue supplemental oxygen therapy. - Referred to pulmonary rehabilitation. - Evaluated for portable oxygen concentrator. - Consider HRCT  Fatigue and deconditioning Fatigue and deconditioning due to chronic anemia, hypoxic respiratory failure, and reduced activity.  Limited nutritional intake impacts daily activities. Poor appetite - Encouraged increased nutritional intake. - Continue protein supplements. - Referred to pulmonary rehabilitation.  Chronic cough Dry, deep cough primarily in the morning and at night.  No significant sputum production.  Previous treatments include cough drops and cough medicine. - Recommended trial of Robitussin. - Consider Delsym   cough syrup or Robitussin  OTC    I spent 40 minutes dedicated to the care of this patient on the date of this encounter to include pre-visit review of records, face-to-face time with  the patient discussing conditions above, post visit ordering of testing, clinical documentation with the electronic health record, making appropriate referrals as documented, and communicating necessary information to the patient's healthcare team.    Lauraine JULIANNA Lites, NP 09/21/2024  12:30 PM

## 2024-09-21 NOTE — Telephone Encounter (Signed)
 I attempted to call patient with his CBC results. There was no answer.  Please call patient and let him know his HGB is 10.1, which is increased from 9.8.,  WBC is 6.2, RBC is 2.95 which is up from 2.87, Platelets are 143, slightly down from 149.  Have him follow up with oncology and ID  as is scheduled.

## 2024-09-24 ENCOUNTER — Other Ambulatory Visit: Payer: Self-pay | Admitting: Cardiology

## 2024-09-24 NOTE — Telephone Encounter (Signed)
ATC x 1 

## 2024-09-29 ENCOUNTER — Other Ambulatory Visit

## 2024-09-29 ENCOUNTER — Ambulatory Visit: Admitting: Physician Assistant

## 2024-09-29 ENCOUNTER — Ambulatory Visit

## 2024-10-02 DIAGNOSIS — F325 Major depressive disorder, single episode, in full remission: Secondary | ICD-10-CM | POA: Diagnosis not present

## 2024-10-02 DIAGNOSIS — N1831 Chronic kidney disease, stage 3a: Secondary | ICD-10-CM | POA: Diagnosis not present

## 2024-10-02 DIAGNOSIS — Z888 Allergy status to other drugs, medicaments and biological substances status: Secondary | ICD-10-CM | POA: Diagnosis not present

## 2024-10-02 DIAGNOSIS — Z6831 Body mass index (BMI) 31.0-31.9, adult: Secondary | ICD-10-CM | POA: Diagnosis not present

## 2024-10-02 DIAGNOSIS — Z96649 Presence of unspecified artificial hip joint: Secondary | ICD-10-CM | POA: Diagnosis not present

## 2024-10-02 DIAGNOSIS — J479 Bronchiectasis, uncomplicated: Secondary | ICD-10-CM | POA: Diagnosis not present

## 2024-10-02 DIAGNOSIS — D696 Thrombocytopenia, unspecified: Secondary | ICD-10-CM | POA: Diagnosis not present

## 2024-10-02 DIAGNOSIS — I509 Heart failure, unspecified: Secondary | ICD-10-CM | POA: Diagnosis not present

## 2024-10-02 DIAGNOSIS — M109 Gout, unspecified: Secondary | ICD-10-CM | POA: Diagnosis not present

## 2024-10-02 DIAGNOSIS — R739 Hyperglycemia, unspecified: Secondary | ICD-10-CM | POA: Diagnosis not present

## 2024-10-02 DIAGNOSIS — Z7901 Long term (current) use of anticoagulants: Secondary | ICD-10-CM | POA: Diagnosis not present

## 2024-10-02 DIAGNOSIS — Z95820 Peripheral vascular angioplasty status with implants and grafts: Secondary | ICD-10-CM | POA: Diagnosis not present

## 2024-10-02 DIAGNOSIS — G4733 Obstructive sleep apnea (adult) (pediatric): Secondary | ICD-10-CM | POA: Diagnosis not present

## 2024-10-02 DIAGNOSIS — C3490 Malignant neoplasm of unspecified part of unspecified bronchus or lung: Secondary | ICD-10-CM | POA: Diagnosis not present

## 2024-10-02 DIAGNOSIS — D6869 Other thrombophilia: Secondary | ICD-10-CM | POA: Diagnosis not present

## 2024-10-02 DIAGNOSIS — G319 Degenerative disease of nervous system, unspecified: Secondary | ICD-10-CM | POA: Diagnosis not present

## 2024-10-02 DIAGNOSIS — J9610 Chronic respiratory failure, unspecified whether with hypoxia or hypercapnia: Secondary | ICD-10-CM | POA: Diagnosis not present

## 2024-10-02 DIAGNOSIS — I2510 Atherosclerotic heart disease of native coronary artery without angina pectoris: Secondary | ICD-10-CM | POA: Diagnosis not present

## 2024-10-02 DIAGNOSIS — M1990 Unspecified osteoarthritis, unspecified site: Secondary | ICD-10-CM | POA: Diagnosis not present

## 2024-10-02 DIAGNOSIS — I429 Cardiomyopathy, unspecified: Secondary | ICD-10-CM | POA: Diagnosis not present

## 2024-10-02 DIAGNOSIS — K219 Gastro-esophageal reflux disease without esophagitis: Secondary | ICD-10-CM | POA: Diagnosis not present

## 2024-10-02 DIAGNOSIS — Z9981 Dependence on supplemental oxygen: Secondary | ICD-10-CM | POA: Diagnosis not present

## 2024-10-02 DIAGNOSIS — R42 Dizziness and giddiness: Secondary | ICD-10-CM | POA: Diagnosis not present

## 2024-10-02 DIAGNOSIS — E669 Obesity, unspecified: Secondary | ICD-10-CM | POA: Diagnosis not present

## 2024-10-02 DIAGNOSIS — J8410 Pulmonary fibrosis, unspecified: Secondary | ICD-10-CM | POA: Diagnosis not present

## 2024-10-02 DIAGNOSIS — I4891 Unspecified atrial fibrillation: Secondary | ICD-10-CM | POA: Diagnosis not present

## 2024-10-02 DIAGNOSIS — I130 Hypertensive heart and chronic kidney disease with heart failure and stage 1 through stage 4 chronic kidney disease, or unspecified chronic kidney disease: Secondary | ICD-10-CM | POA: Diagnosis not present

## 2024-10-05 DIAGNOSIS — N1831 Chronic kidney disease, stage 3a: Secondary | ICD-10-CM | POA: Diagnosis not present

## 2024-10-05 DIAGNOSIS — I2510 Atherosclerotic heart disease of native coronary artery without angina pectoris: Secondary | ICD-10-CM | POA: Diagnosis not present

## 2024-10-05 DIAGNOSIS — I509 Heart failure, unspecified: Secondary | ICD-10-CM | POA: Diagnosis not present

## 2024-10-05 DIAGNOSIS — E7800 Pure hypercholesterolemia, unspecified: Secondary | ICD-10-CM | POA: Diagnosis not present

## 2024-10-05 DIAGNOSIS — I10 Essential (primary) hypertension: Secondary | ICD-10-CM | POA: Diagnosis not present

## 2024-10-05 DIAGNOSIS — Z0000 Encounter for general adult medical examination without abnormal findings: Secondary | ICD-10-CM | POA: Diagnosis not present

## 2024-10-05 DIAGNOSIS — C3490 Malignant neoplasm of unspecified part of unspecified bronchus or lung: Secondary | ICD-10-CM | POA: Diagnosis not present

## 2024-10-05 DIAGNOSIS — K219 Gastro-esophageal reflux disease without esophagitis: Secondary | ICD-10-CM | POA: Diagnosis not present

## 2024-10-05 DIAGNOSIS — I480 Paroxysmal atrial fibrillation: Secondary | ICD-10-CM | POA: Diagnosis not present

## 2024-10-06 ENCOUNTER — Telehealth (HOSPITAL_COMMUNITY): Payer: Self-pay

## 2024-10-06 ENCOUNTER — Inpatient Hospital Stay: Attending: Internal Medicine

## 2024-10-06 ENCOUNTER — Inpatient Hospital Stay

## 2024-10-06 VITALS — BP 120/68 | HR 77 | Temp 98.6°F | Resp 17 | Ht 68.0 in | Wt 213.2 lb

## 2024-10-06 DIAGNOSIS — Z79620 Long term (current) use of immunosuppressive biologic: Secondary | ICD-10-CM | POA: Insufficient documentation

## 2024-10-06 DIAGNOSIS — Z5112 Encounter for antineoplastic immunotherapy: Secondary | ICD-10-CM | POA: Diagnosis not present

## 2024-10-06 DIAGNOSIS — C3431 Malignant neoplasm of lower lobe, right bronchus or lung: Secondary | ICD-10-CM | POA: Insufficient documentation

## 2024-10-06 LAB — CBC WITH DIFFERENTIAL (CANCER CENTER ONLY)
Abs Immature Granulocytes: 0.02 K/uL (ref 0.00–0.07)
Basophils Absolute: 0 K/uL (ref 0.0–0.1)
Basophils Relative: 0 %
Eosinophils Absolute: 0 K/uL (ref 0.0–0.5)
Eosinophils Relative: 0 %
HCT: 31.5 % — ABNORMAL LOW (ref 39.0–52.0)
Hemoglobin: 10.2 g/dL — ABNORMAL LOW (ref 13.0–17.0)
Immature Granulocytes: 0 %
Lymphocytes Relative: 6 %
Lymphs Abs: 0.3 K/uL — ABNORMAL LOW (ref 0.7–4.0)
MCH: 33.8 pg (ref 26.0–34.0)
MCHC: 32.4 g/dL (ref 30.0–36.0)
MCV: 104.3 fL — ABNORMAL HIGH (ref 80.0–100.0)
Monocytes Absolute: 0.4 K/uL (ref 0.1–1.0)
Monocytes Relative: 7 %
Neutro Abs: 4.8 K/uL (ref 1.7–7.7)
Neutrophils Relative %: 87 %
Platelet Count: 125 K/uL — ABNORMAL LOW (ref 150–400)
RBC: 3.02 MIL/uL — ABNORMAL LOW (ref 4.22–5.81)
RDW: 15.2 % (ref 11.5–15.5)
WBC Count: 5.5 K/uL (ref 4.0–10.5)
nRBC: 0 % (ref 0.0–0.2)

## 2024-10-06 LAB — CMP (CANCER CENTER ONLY)
ALT: 11 U/L (ref 0–44)
AST: 20 U/L (ref 15–41)
Albumin: 4 g/dL (ref 3.5–5.0)
Alkaline Phosphatase: 122 U/L (ref 38–126)
Anion gap: 11 (ref 5–15)
BUN: 22 mg/dL (ref 8–23)
CO2: 24 mmol/L (ref 22–32)
Calcium: 9.5 mg/dL (ref 8.9–10.3)
Chloride: 107 mmol/L (ref 98–111)
Creatinine: 1.03 mg/dL (ref 0.61–1.24)
GFR, Estimated: >60 mL/min (ref >60)
Glucose, Bld: 134 mg/dL — ABNORMAL HIGH (ref 70–99)
Potassium: 4.4 mmol/L (ref 3.5–5.1)
Sodium: 141 mmol/L (ref 135–145)
Total Bilirubin: 0.5 mg/dL (ref 0.0–1.2)
Total Protein: 7.1 g/dL (ref 6.5–8.1)

## 2024-10-06 LAB — SAMPLE TO BLOOD BANK

## 2024-10-06 LAB — TSH: TSH: 1.24 u[IU]/mL (ref 0.350–4.500)

## 2024-10-06 MED ORDER — SODIUM CHLORIDE 0.9 % IV SOLN: INTRAVENOUS | Status: DC

## 2024-10-06 MED ORDER — SODIUM CHLORIDE 0.9 % IV SOLN
1500.0000 mg | Freq: Once | INTRAVENOUS | Status: AC
  Administered 2024-10-06: 1500 mg via INTRAVENOUS
  Filled 2024-10-06: qty 30

## 2024-10-06 NOTE — Telephone Encounter (Signed)
 LVM for pt to schedule for [facility]

## 2024-10-06 NOTE — Telephone Encounter (Signed)
 Pt insurance is active and benefits verified through [facility]. Co-pay $30, DED $0/$0 met, out of pocket $6,750/$6,750 met, co-insurance 0%. No pre-authorization required. 10/06/2024 @ 9:25am, spoke with [place], REF# [id number].

## 2024-10-06 NOTE — Patient Instructions (Signed)
 [facility] - A DEPT OF [facility]  [facility]  Discharge Instructions: Thank you for choosing [facility] to provide your oncology and hematology care.   If you have a lab appointment with the Cancer Center, please go directly to the Cancer Center and check in at the registration area.   Wear comfortable clothing and clothing appropriate for easy access to any Portacath or PICC line.   We strive to give you quality time with your provider. You may need to reschedule your appointment if you arrive late (15 or more minutes).  Arriving late affects you and other patients whose appointments are after yours.  Also, if you miss three or more appointments without notifying the office, you may be dismissed from the clinic at the provider's discretion.      For prescription refill requests, have your pharmacy contact our office and allow 72 hours for refills to be completed.    Today you received the following chemotherapy and/or immunotherapy agents imfinzi       To help prevent nausea and vomiting after your treatment, we encourage you to take your nausea medication as directed.  BELOW ARE SYMPTOMS THAT SHOULD BE REPORTED IMMEDIATELY: *FEVER GREATER THAN 100.4 F (38 C) OR HIGHER *CHILLS OR SWEATING *NAUSEA AND VOMITING THAT IS NOT CONTROLLED WITH YOUR NAUSEA MEDICATION *UNUSUAL SHORTNESS OF BREATH *UNUSUAL BRUISING OR BLEEDING *URINARY PROBLEMS (pain or burning when urinating, or frequent urination) *BOWEL PROBLEMS (unusual diarrhea, constipation, pain near the anus) TENDERNESS IN MOUTH AND THROAT WITH OR WITHOUT PRESENCE OF ULCERS (sore throat, sores in mouth, or a toothache) UNUSUAL RASH, SWELLING OR PAIN  UNUSUAL VAGINAL DISCHARGE OR ITCHING   Items with * indicate a potential emergency and should be followed up as soon as possible or go to the Emergency Department if any problems should occur.  Please show the CHEMOTHERAPY ALERT CARD or IMMUNOTHERAPY  ALERT CARD at check-in to the Emergency Department and triage nurse.  Should you have questions after your visit or need to cancel or reschedule your appointment, please contact [facility] DE[facility]  Dept: [phone]  and follow the prompts.  Office hours are 8:00 a.m. to 4:30 p.m. Monday - Friday. Please note that voicemails left after 4:00 p.m. may not be returned until the following business day.  We are closed weekends and major holidays. You have access to a nurse at all times for urgent questions. Please call the main number to the clinic Dept: [phone] and follow the prompts.   For any non-urgent questions, you may also contact your provider using MyChart. We now offer e-Visits for anyone 34 and older to request care online for non-urgent symptoms. For details visit [email or web address].   Also download the MyChart app! Go to the app store, search MyChart, open the app, select [facility], and log in with your MyChart username and password.

## 2024-10-07 LAB — T4: T4, Total: 6.2 ug/dL (ref 4.5–12.0)

## 2024-10-19 ENCOUNTER — Other Ambulatory Visit: Payer: Self-pay

## 2024-10-20 ENCOUNTER — Telehealth (HOSPITAL_COMMUNITY): Payer: Self-pay

## 2024-10-20 NOTE — Telephone Encounter (Signed)
 F/u call regarding pulmonary rehab- no answer, left message.  Closing referral.

## 2024-10-21 ENCOUNTER — Ambulatory Visit (HOSPITAL_BASED_OUTPATIENT_CLINIC_OR_DEPARTMENT_OTHER)

## 2024-10-21 ENCOUNTER — Ambulatory Visit (INDEPENDENT_AMBULATORY_CARE_PROVIDER_SITE_OTHER): Admitting: Pulmonary Disease

## 2024-10-21 ENCOUNTER — Encounter: Payer: Self-pay | Admitting: Internal Medicine

## 2024-10-21 ENCOUNTER — Other Ambulatory Visit: Payer: Self-pay

## 2024-10-21 ENCOUNTER — Encounter (HOSPITAL_BASED_OUTPATIENT_CLINIC_OR_DEPARTMENT_OTHER): Payer: Self-pay | Admitting: Pulmonary Disease

## 2024-10-21 ENCOUNTER — Encounter: Payer: Self-pay | Admitting: Physician Assistant

## 2024-10-21 ENCOUNTER — Other Ambulatory Visit (HOSPITAL_BASED_OUTPATIENT_CLINIC_OR_DEPARTMENT_OTHER): Payer: Self-pay

## 2024-10-21 VITALS — BP 118/77 | HR 84 | Ht 68.0 in | Wt 208.0 lb

## 2024-10-21 DIAGNOSIS — R0600 Dyspnea, unspecified: Secondary | ICD-10-CM

## 2024-10-21 DIAGNOSIS — J9621 Acute and chronic respiratory failure with hypoxia: Secondary | ICD-10-CM | POA: Diagnosis not present

## 2024-10-21 DIAGNOSIS — J849 Interstitial pulmonary disease, unspecified: Secondary | ICD-10-CM | POA: Diagnosis not present

## 2024-10-21 MED ORDER — IPRATROPIUM-ALBUTEROL 0.5-2.5 (3) MG/3ML IN SOLN
3.0000 mL | RESPIRATORY_TRACT | 0 refills | Status: AC | PRN
  Filled 2024-10-21: qty 270, 30d supply, fill #0

## 2024-10-21 MED ORDER — PREDNISONE 10 MG PO TABS
ORAL_TABLET | ORAL | 0 refills | Status: AC
  Filled 2024-10-21: qty 20, 8d supply, fill #0

## 2024-10-21 MED ORDER — IPRATROPIUM-ALBUTEROL 0.5-2.5 (3) MG/3ML IN SOLN
3.0000 mL | Freq: Once | RESPIRATORY_TRACT | Status: AC
  Administered 2024-10-21: 3 mL via RESPIRATORY_TRACT

## 2024-10-21 MED ORDER — METHYLPREDNISOLONE ACETATE 80 MG/ML IJ SUSP
80.0000 mg | Freq: Once | INTRAMUSCULAR | Status: AC
  Administered 2024-10-21: 80 mg via INTRAMUSCULAR

## 2024-10-21 NOTE — Progress Notes (Signed)
 PFT not done per patient SOB AND Spo2 85% on 2L. Dr Kassie with patient.

## 2024-10-21 NOTE — Patient Instructions (Signed)
 PFT not done per patient SOB AND Spo2 85% on 2L. Dr Kassie with patient.

## 2024-10-21 NOTE — Patient Instructions (Addendum)
 Severe restrictive lung defect with moderately reduced DLCO Post radiation fibrosis in possible flare Acute exacerbation with severe dyspnea, wheezing, and low oxygen saturation.  - Administered nebulizer treatment in office. - IM steroids - Prednisone  taper. May increase blood sugar - Duoneb medication sent to [facility] to pick up today - Nebulizer device ordered STAT. [employer] aware of care  Acute on chronic hypoxemic respiratory failure 2/2 above - Discharged from clinic on 3L pulsed - Discussed red flags including respiratory distress, persistent hypoxemia >88% above prescribed level of oxygen.

## 2024-10-21 NOTE — Progress Notes (Signed)
 History of Present Illness Peter [name] is a 78 y.o. male with stage IIIA non-small cell lung cancer . He is a patient of Dr. Shelah   10/21/2024 Discussed the use of AI scribe software for clinical note transcription with the patient, who gave verbal consent to proceed.  History of Present Illness  10/21/24 Peter [name] is a 78 year old male with stage IIIA non-small cell lung cancer, anemia, chronic respiratory failure, OSA on CPAP who presents with worsening shortness of breath and low oxygen levels.  He was started on oxygen in October wearing 2L with activity. Was walked on his last visit requiring 3L with activity.  He has been experiencing low oxygen levels at home, with readings dropping into the seventies, for several weeks. He normally uses two liters of oxygen at home.  There is worsening shortness of breath, particularly with movement and talking, over the past several weeks. He has not received treatment for any exacerbations since February and has not been on antibiotics or steroids recently.  He experiences wheezing and coughing consistently, which has worsened over the last several weeks.  Vitals:   10/21/24 1326  BP: 118/77  Pulse: 86  Height: 5' 8 (1.727 m)  Weight: 208 lb (94.3 kg)  SpO2: (!) 85% Comment: 2L POC  BMI (Calculated): 31.63        Latest Ref Rng & Units 10/06/2024    9:29 AM 09/21/2024   11:42 AM 09/08/2024   10:22 AM  CBC  WBC 4.0 - 10.5 K/uL 5.5  6.2  7.4   Hemoglobin 13.0 - 17.0 g/dL 89.7  89.8  9.8   Hematocrit 39.0 - 52.0 % 31.5  30.8  29.5   Platelets 150 - 400 K/uL 125  143.0  149        Latest Ref Rng & Units 10/06/2024    9:29 AM 09/08/2024   10:22 AM 08/11/2024    1:04 PM  BMP  Glucose 70 - 99 mg/dL 865  843  835   BUN 8 - 23 mg/dL 22  20  24    Creatinine 0.61 - 1.24 mg/dL 8.96  8.84  8.95   Sodium 135 - 145 mmol/L 141  141  141   Potassium 3.5 - 5.1 mmol/L 4.4  4.4  4.1   Chloride 98 - 111 mmol/L 107  108  110    CO2 22 - 32 mmol/L 24  25  24    Calcium  8.9 - 10.3 mg/dL 9.5  9.4  8.7     BNP    Component Value Date/Time   BNP 416.8 (H) 01/23/2024 0220    ProBNP    Component Value Date/Time   PROBNP 144.7 (H) 02/03/2012 1854    PFT    Component Value Date/Time   FEV1PRE 1.13 03/05/2024 1100   FEV1POST 1.24 03/05/2024 1100   FVCPRE 1.24 03/05/2024 1100   FVCPOST 1.40 03/05/2024 1100   TLC 2.92 03/05/2024 1100   DLCOUNC 11.81 03/05/2024 1100   PREFEV1FVCRT 92 03/05/2024 1100   PSTFEV1FVCRT 89 03/05/2024 1100    No results found.    Past medical hx Past Medical History:  Diagnosis Date   Arthritis    At risk for sleep apnea    STOP-BANG= 5       SENT TO PCP 07-12-2015   CAD (coronary artery disease)    a. s/p PCI with DES x 3 to LAD in 2006 with bifurcation lesion and Plavix  indefinitely was recommended.   Chronic  combined systolic and diastolic CHF (congestive heart failure) (HCC)    a. Prior EF normal but EF dropped to 30-35% in 07/2016 in setting of AFIB.   CKD (chronic kidney disease), stage III (HCC)    Depression    Dyslipidemia    Dyspnea    tired quick d/t Afib   Dyspnea on exertion    Dysrhythmia    A.FIB   GERD (gastroesophageal reflux disease)    History of GI bleed    History of kidney stones    History of MI (myocardial infarction)    11-05-2005--  periprocedural MI  (cardiac cath) per dr obie note   History of radiation therapy    Right lung-05/11/24-06/25/24- Dr. Lynwood Nasuti   Hypertension    Hypertriglyceridemia    Iron deficiency anemia    Myocardial infarction [facility])    Persistent atrial fibrillation (HCC)    a. 07/2016, started on Eliquis  -> readmitted later that month for AF RVR/acute HF - initially unsuccessful DCCV following TEE, so loaded with amio with repeat successful DCCV 07/2816.   Right ureteral stone    S/P drug eluting coronary stent placement    x3  to LAD  2006   Sleep apnea    wears BIPAP, 14 up to 24     Social History    Tobacco Use   Smoking status: Never    Passive exposure: Past   Smokeless tobacco: Never  Vaping Use   Vaping status: Never Used  Substance Use Topics   Alcohol  use: No    Comment: 1 beer a year   Drug use: No    Peter [name] reports that he has never smoked. He has been exposed to tobacco smoke. He has never used smokeless tobacco. He reports that he does not drink alcohol  and does not use drugs.  Tobacco Cessation: Counseling given: Not Answered Never smoker   Past surgical hx, Family hx, Social hx all reviewed.  Current Outpatient Medications on File Prior to Visit  Medication Sig   allopurinol  (ZYLOPRIM ) 100 MG tablet Take 100 mg by mouth at bedtime.   allopurinol  (ZYLOPRIM ) 300 MG tablet Take 300 mg by mouth in the morning.   atorvastatin  (LIPITOR) 40 MG tablet TAKE 1 TABLET EVERY DAY (NEED MD APPOINTMENT, CALL [phone])   carvedilol  (COREG ) 25 MG tablet Take 1 tablet (25 mg total) by mouth 2 (two) times daily with a meal.   Cyanocobalamin  (VITAMIN B-12 PO) Take 1 tablet by mouth daily.   diphenhydramine -acetaminophen  (TYLENOL  PM) 25-500 MG TABS tablet Take 1 tablet by mouth at bedtime.   ferrous sulfate  325 (65 FE) MG tablet Take 1 tablet (325 mg total) by mouth every morning.   furosemide  (LASIX ) 40 MG tablet Take 1 tablet (40 mg total) by mouth daily.   hydrocortisone  1 % lotion Apply 1 Application topically 2 (two) times daily.   [Paused] lisinopril  (ZESTRIL ) 40 MG tablet TAKE 1 TABLET EVERY DAY (Patient not taking: Reported on 09/21/2024)   nitroGLYCERIN  (NITROSTAT ) 0.4 MG SL tablet Place 1 tablet (0.4 mg total) under the tongue every 5 (five) minutes as needed for chest pain. X 3 doses   pantoprazole  (PROTONIX ) 40 MG tablet Take 40 mg by mouth daily.   polyvinyl alcohol  (LIQUIFILM TEARS) 1.4 % ophthalmic solution Place 1 drop into both eyes daily as needed for dry eyes.   rivaroxaban  (XARELTO ) 20 MG TABS tablet Take 1 tablet (20 mg total) by mouth at bedtime. Okay  to restart this medication on 02/10/24  No current facility-administered medications on file prior to visit.     Allergies  Allergen Reactions   Prednisone  Other (See Comments)    Made BS increase to 500- discovered on 01/22/24 Cannot take dose pack   Spironolactone  Other (See Comments)    Tender breast   Review of Systems  Constitutional:  Negative for chills, diaphoresis, fever, malaise/fatigue and weight loss.  HENT:  Negative for congestion.   Respiratory:  Positive for cough, sputum production and shortness of breath. Negative for hemoptysis and wheezing.   Cardiovascular:  Negative for chest pain, palpitations and leg swelling.     Vital Signs BP 118/77   Pulse 86   Ht 5' 8 (1.727 m)   Wt 208 lb (94.3 kg)   SpO2 (!) 85% Comment: 2L POC  BMI 31.63 kg/m    Physical Exam: Physical Exam Physical Exam: General: Chronically ill-appearing, Mild respiratory distress that improved after nebulizer HENT: [place], AT Eyes: EOMI, no scleral icterus Respiratory: Faint bibasilar crackles. Coarse breath sounds but no wheezing Cardiovascular: RRR, -M/R/G, no JVD Extremities:-Edema,-tenderness Neuro: AAO x4, CNII-XII grossly intact Psych: Normal mood, normal affect    Assessment/Plan Assessment & Plan Initially presented to office for PFT evaluation however found SOB and hypoxemic to 85% on home 2L. No recent antibiotic or steroid treatment since February. In mild respiratory distress. Given Duoneb in-office with improvement and weaned O2 from continuous oxygen to pulsed 3L. Recommended ED evaluation however with improvement to baseline O2 requirement will hold. Red flags discussed below.  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  - Administered nebulizer treatment in office. - IM steroids - Prednisone   taper. May increase blood sugar. Recommend checking levels - Duoneb medication sent to [facility] to pick up today - Nebulizer device ordered STAT. [facility] aware of care  Acute on chronic hypoxemic respiratory failure 2/2 above - Discharged from clinic on 3L pulsed - Discussed red flags including respiratory distress, persistent hypoxemia >88% above prescribed level of oxygen.    I have spent a total time of 40-minutes on the day of the appointment including chart review, data review, collecting history, coordinating care and discussing medical diagnosis and plan with the patient/family. Past medical history, allergies, medications were reviewed. Pertinent imaging, labs and tests included in this note have been reviewed and interpreted independently by me.  Alphonsa Brickle Slater Staff, MD 10/21/2024  1:29 PM

## 2024-10-22 ENCOUNTER — Encounter: Payer: Self-pay | Admitting: Acute Care

## 2024-10-22 ENCOUNTER — Ambulatory Visit (INDEPENDENT_AMBULATORY_CARE_PROVIDER_SITE_OTHER)

## 2024-10-22 ENCOUNTER — Telehealth: Payer: Self-pay

## 2024-10-22 ENCOUNTER — Encounter: Payer: Self-pay | Admitting: Internal Medicine

## 2024-10-22 ENCOUNTER — Ambulatory Visit: Admitting: Acute Care

## 2024-10-22 ENCOUNTER — Encounter: Payer: Self-pay | Admitting: Physician Assistant

## 2024-10-22 VITALS — BP 118/70 | HR 93 | Temp 98.5°F | Ht 68.0 in | Wt 211.4 lb

## 2024-10-22 DIAGNOSIS — R911 Solitary pulmonary nodule: Secondary | ICD-10-CM

## 2024-10-22 DIAGNOSIS — Z85118 Personal history of other malignant neoplasm of bronchus and lung: Secondary | ICD-10-CM | POA: Diagnosis not present

## 2024-10-22 DIAGNOSIS — J701 Chronic and other pulmonary manifestations due to radiation: Secondary | ICD-10-CM | POA: Diagnosis not present

## 2024-10-22 DIAGNOSIS — R918 Other nonspecific abnormal finding of lung field: Secondary | ICD-10-CM

## 2024-10-22 DIAGNOSIS — Z923 Personal history of irradiation: Secondary | ICD-10-CM | POA: Diagnosis not present

## 2024-10-22 DIAGNOSIS — R0600 Dyspnea, unspecified: Secondary | ICD-10-CM | POA: Diagnosis not present

## 2024-10-22 DIAGNOSIS — J984 Other disorders of lung: Secondary | ICD-10-CM | POA: Diagnosis not present

## 2024-10-22 DIAGNOSIS — J189 Pneumonia, unspecified organism: Secondary | ICD-10-CM

## 2024-10-22 MED ORDER — AMOXICILLIN-POT CLAVULANATE 875-125 MG PO TABS: 1.0000 | ORAL_TABLET | Freq: Two times a day (BID) | ORAL | 0 refills | Status: DC

## 2024-10-22 NOTE — Telephone Encounter (Signed)
 Attempted to call patient to cancel patient appointment as he saw dr.ellison yesterday to review the pft,if patient calls please relay message

## 2024-10-22 NOTE — Patient Instructions (Addendum)
 It is good to see you today. I am so glad you are feeling better after seeing Dr. Kassie yesterday. We will do a CXR today , just to make sure there is not an underlying infection. We will call you with results. If the CXR shows infection we will send in an antibiotic.  We will reschedule your PFT's once you are better. Follow up in 3 weeks with Lauraine NP or Dr. Kassie. Seek emergency care for oxygen sats less that 88% that do not quickly rebound. Continue the DuoNebs every 4 hours today, then tomorrow  do AM  and PM and in between if needed. Complete prednisone  taper as ordered.  Wear your oxygen at 2.5 L at rest and 3 L with activity.  We will order your DME to bleed oxygen into your CPAP machine.( [facility]) Call if you need us  sooner for flare. Note your daily symptoms > remember red flags for COPD:  Increase in cough, increase in sputum production, increase in shortness of breath or activity intolerance. Saturations below 88%, If you notice these symptoms, please call to be seen.   Please contact office for sooner follow up if symptoms do not improve or worsen or seek emergency care

## 2024-10-22 NOTE — Progress Notes (Signed)
 History of Present Illness Peter [name] is a 78 y.o. male never smoker with stage IIIA non-small cell lung cancer, anemia, chronic respiratory failure , and OSA on CPAP . He is a patient of Dr. Shelah .   10/22/2024 Discussed the use of AI scribe software for clinical note transcription with the patient, who gave verbal consent to proceed.  History of Present Illness Pt. Was at the [facility] office yesterday 12/11 for PFT's He presented to the office for PFT's with low oxygen saturations at home for several weeks. He was started on oxygen in October wearing 2L with activity. Was walked on his last visit requiring 3L with activity. He has had worsening shortness of breath, wheezing and cough, particularly with movement and talking, over the past several weeks. PFT's were deferred , he was seen urgently by Dr. Kassie , was given a Duoneb treatment and prescribed DuoNebs , given a DepoMedrol injection, and a prednisone  taper.  He had not received treatment for any exacerbations since February 2025  and had not been on antibiotics or steroids recently. Dr. Kassie felt this was a post radiation fibrosis flare. He refused to be seen in the ED as was recommended. He was discharged with DuoNebs and a stat order was placed to a neb machine. He was give Red Flag warning signs for ED visit.   He presents today feeling much better. He is still coughing. The cough is non productive. He describes it as a dry hacking cough. He denies any fever, but does endorse some chills daily.He is speaking in full sentences. I will get a CXR to ensure we do not need to treat with antibiotics.   Last radiation treatment was August 15. Next CT Chest 11/2024. He is continuing to have  durvalumab  infusions every 4 weeks, he will continue with this for a year, ending September 2026.  I have told patient he needs to seek emergency care when his sats are below 88% and do not rebound quickly.He verbalized understanding.    Follow up in 3 weeks or sooner as needed  to ensure he is still doing well.   Test Results: DG Chest 10/22/2024 Stable cardiomediastinal silhouette. Small right pleural effusion is noted. New right perihilar opacity is noted concerning for possible pneumonia or atelectasis. Otherwise stable bilateral lung opacities concerning for scarring. The visualized skeletal structures are unremarkable.   IMPRESSION: New right perihilar opacity is noted concerning for possible pneumonia or atelectasis. Small right pleural effusion is noted.     Latest Ref Rng & Units 10/06/2024    9:29 AM 09/21/2024   11:42 AM 09/08/2024   10:22 AM  CBC  WBC 4.0 - 10.5 K/uL 5.5  6.2  7.4   Hemoglobin 13.0 - 17.0 g/dL 89.7  89.8  9.8   Hematocrit 39.0 - 52.0 % 31.5  30.8  29.5   Platelets 150 - 400 K/uL 125  143.0  149        Latest Ref Rng & Units 10/06/2024    9:29 AM 09/08/2024   10:22 AM 08/11/2024    1:04 PM  BMP  Glucose 70 - 99 mg/dL 865  843  835   BUN 8 - 23 mg/dL 22  20  24    Creatinine 0.61 - 1.24 mg/dL 8.96  8.84  8.95   Sodium 135 - 145 mmol/L 141  141  141   Potassium 3.5 - 5.1 mmol/L 4.4  4.4  4.1   Chloride 98 - 111 mmol/L 107  108  110   CO2 22 - 32 mmol/L 24  25  24    Calcium  8.9 - 10.3 mg/dL 9.5  9.4  8.7     BNP    Component Value Date/Time   BNP 416.8 (H) 01/23/2024 0220    ProBNP    Component Value Date/Time   PROBNP 144.7 (H) 02/03/2012 1854    PFT    Component Value Date/Time   FEV1PRE 1.13 03/05/2024 1100   FEV1POST 1.24 03/05/2024 1100   FVCPRE 1.24 03/05/2024 1100   FVCPOST 1.40 03/05/2024 1100   TLC 2.92 03/05/2024 1100   DLCOUNC 11.81 03/05/2024 1100   PREFEV1FVCRT 92 03/05/2024 1100   PSTFEV1FVCRT 89 03/05/2024 1100    No results found.   Past medical hx Past Medical History:  Diagnosis Date   Arthritis    At risk for sleep apnea    STOP-BANG= 5       SENT TO PCP 07-12-2015   CAD (coronary artery disease)    a. s/p PCI with DES x 3 to  LAD in 2006 with bifurcation lesion and Plavix  indefinitely was recommended.   Chronic combined systolic and diastolic CHF (congestive heart failure) (HCC)    a. Prior EF normal but EF dropped to 30-35% in 07/2016 in setting of AFIB.   CKD (chronic kidney disease), stage III (HCC)    Depression    Dyslipidemia    Dyspnea    tired quick d/t Afib   Dyspnea on exertion    Dysrhythmia    A.FIB   GERD (gastroesophageal reflux disease)    History of GI bleed    History of kidney stones    History of MI (myocardial infarction)    11-05-2005--  periprocedural MI  (cardiac cath) per dr obie note   History of radiation therapy    Right lung-05/11/24-06/25/24- Dr. Lynwood Nasuti   Hypertension    Hypertriglyceridemia    Iron deficiency anemia    Myocardial infarction [facility])    Persistent atrial fibrillation (HCC)    a. 07/2016, started on Eliquis  -> readmitted later that month for AF RVR/acute HF - initially unsuccessful DCCV following TEE, so loaded with amio with repeat successful DCCV 07/2816.   Right ureteral stone    S/P drug eluting coronary stent placement    x3  to LAD  2006   Sleep apnea    wears BIPAP, 14 up to 24     Social History[1]  Peter [name] reports that he has never smoked. He has been exposed to tobacco smoke. He has never used smokeless tobacco. He reports that he does not drink alcohol  and does not use drugs.  Tobacco Cessation: Counseling given: Not Answered Never smoker  Past surgical hx, Family hx, Social hx all reviewed.  Current Outpatient Medications on File Prior to Visit  Medication Sig   allopurinol  (ZYLOPRIM ) 100 MG tablet Take 100 mg by mouth at bedtime.   allopurinol  (ZYLOPRIM ) 300 MG tablet Take 300 mg by mouth in the morning.   atorvastatin  (LIPITOR) 40 MG tablet TAKE 1 TABLET EVERY DAY (NEED MD APPOINTMENT, CALL [phone])   carvedilol  (COREG ) 25 MG tablet Take 1 tablet (25 mg total) by mouth 2 (two) times daily with a meal.   Cyanocobalamin   (VITAMIN B-12 PO) Take 1 tablet by mouth daily.   diphenhydramine -acetaminophen  (TYLENOL  PM) 25-500 MG TABS tablet Take 1 tablet by mouth at bedtime.   ferrous sulfate  325 (65 FE) MG tablet Take 1 tablet (325 mg total) by mouth every morning.  furosemide  (LASIX ) 40 MG tablet Take 1 tablet (40 mg total) by mouth daily.   hydrocortisone  1 % lotion Apply 1 Application topically 2 (two) times daily.   ipratropium-albuterol  (DUONEB) 0.5-2.5 (3) MG/3ML SOLN Take 3 mLs by nebulization every 4 (four) hours as needed (shortness of breath or wheezing).   nitroGLYCERIN  (NITROSTAT ) 0.4 MG SL tablet Place 1 tablet (0.4 mg total) under the tongue every 5 (five) minutes as needed for chest pain. X 3 doses   pantoprazole  (PROTONIX ) 40 MG tablet Take 40 mg by mouth daily.   polyvinyl alcohol  (LIQUIFILM TEARS) 1.4 % ophthalmic solution Place 1 drop into both eyes daily as needed for dry eyes.   predniSONE  (DELTASONE ) 10 MG tablet Take 4 tablets (40 mg total) by mouth daily with breakfast for 2 days, THEN 3 tablets (30 mg total) daily with breakfast for 2 days, THEN 2 tablets (20 mg total) daily with breakfast for 2 days, THEN 1 tablet (10 mg total) daily with breakfast for 2 days.   rivaroxaban  (XARELTO ) 20 MG TABS tablet Take 1 tablet (20 mg total) by mouth at bedtime. Okay to restart this medication on 02/10/24   [Paused] lisinopril  (ZESTRIL ) 40 MG tablet TAKE 1 TABLET EVERY DAY (Patient not taking: Reported on 10/22/2024)   No current facility-administered medications on file prior to visit.     Allergies[2]  Review Of Systems:  Constitutional:   No  weight loss, night sweats,  Fevers, +chills, +fatigue, or  lassitude.  HEENT:   No headaches,  Difficulty swallowing,  Tooth/dental problems, or  Sore throat,                No sneezing, itching, ear ache, nasal congestion, post nasal drip,   CV:  No chest pain,  Orthopnea, PND, swelling in lower extremities, anasarca, dizziness, palpitations, syncope.   GI   No heartburn, indigestion, abdominal pain, nausea, vomiting, diarrhea, change in bowel habits, loss of appetite, bloody stools.   Resp: + shortness of breath with exertion or at rest.  No excess mucus, no productive cough,  No non-productive cough,  No coughing up of blood.  No change in color of mucus.  No wheezing.  No chest wall deformity, Breath sounds distant, but no wheezing or rhonchi.  Skin: no rash or lesions.  GU: no dysuria, change in color of urine, no urgency or frequency.  No flank pain, no hematuria   MS:  No joint pain or swelling.  No decreased range of motion.  No back pain.  Psych:  No change in mood or affect. No depression or anxiety.  No memory loss.   Vital Signs BP 118/70   Pulse 93   Temp 98.5 F (36.9 C) (Oral)   Ht 5' 8 (1.727 m)   Wt 211 lb 6.4 oz (95.9 kg)   SpO2 95% Comment: on 3L pusled dose  BMI 32.14 kg/m    Physical Exam:  General- No distress,  A&Ox3,pleasant  ENT: No sinus tenderness, TM clear, pale nasal mucosa, no oral exudate,no post nasal drip, no LAN Cardiac: S1, S2, regular rate and rhythm, no murmur Chest: No wheeze/ rales/ dullness; no accessory muscle use, no nasal flaring, no sternal retractions, distant breath sounds  Abd.: Soft Non-tender, ND, BS +, Body mass index is 32.14 kg/m.  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 We will call you with results. If the CXR shows infection we will send in an antibiotic.  We will reschedule your PFT's once you are better. Follow up in  3 weeks with Lauraine NP or Dr. Kassie. Seek emergency care for oxygen sats less that 88% that do not quickly rebound. Continue the DuoNebs every 4 hours today, then tomorrow  do AM  and PM and in between if needed. Complete prednisone  taper as ordered.  Wear your oxygen at 2.5 L at rest and 3 L with activity.  We will order your DME to bleed oxygen into your CPAP machine.( [facility]) Call if you need us  sooner for flare. Note your daily symptoms > remember red flags for COPD:  Increase in cough, increase in sputum production, increase in shortness of breath or activity intolerance. Saturations below 88%, If you notice these symptoms, please call to be seen.   Please contact office for sooner follow up if symptoms do not improve or worsen or seek emergency care     I spent 25 minutes dedicated to the care of this patient on the date of this encounter to include pre-visit review of records, face-to-face time with the patient discussing conditions above, post visit ordering of testing, clinical documentation with the electronic health record, making appropriate referrals as documented, and communicating necessary information to the patient's healthcare team.     Addendum 1300 10/22/2024 I have called the patient with the results of his chest x-ray. Chest x-ray showed a new right perihilar opacity concerning for possible pneumonia or atelectasis.  There is also a small right pleural effusion.  As patient has been so symptomatic we will go ahead and treat with Augmentin  twice daily for 7 days.  I have asked the patient to take a probiotic while on the antibiotic to maintain GI bacteria, and decrease the risk of distress.  I have called the prescription in, patient verbalized understanding.  Patient has follow-up January 5 with Dr. Kassie at [facility].  I have asked him to call to be seen sooner if he continues to have shortness of breath and does not appear to be getting better after treatment with  steroids and antibiotics.  Peter JULIANNA Lites, NP 10/22/2024  9:34 AM             [1]  Social History Tobacco Use   Smoking status: Never    Passive exposure: Past   Smokeless tobacco: Never  Vaping Use   Vaping status: Never Used  Substance Use Topics   Alcohol  use: No    Comment: 1 beer a year   Drug use: No  [2]  Allergies Allergen Reactions   Prednisone  Other (See Comments)    Made BS increase to 500- discovered on 01/22/24 Cannot take dose pack   Spironolactone  Other (See Comments)    Tender breast

## 2024-10-28 ENCOUNTER — Telehealth: Payer: Self-pay

## 2024-10-28 ENCOUNTER — Ambulatory Visit: Admitting: Emergency Medicine

## 2024-10-28 NOTE — Telephone Encounter (Signed)
 Pt returned the call and it was routed to the front desk. Pt was scheduled for this afternoon.

## 2024-10-28 NOTE — Telephone Encounter (Signed)
 Called patient to schedule OV. LVM to call office. There is an 11 am with Hope, NP or [place] at 3 pm today if patient is available.

## 2024-10-28 NOTE — Telephone Encounter (Signed)
-----   Message from Lauraine Lites, NP sent at 10/27/2024  2:38 PM EST ----- Regarding: RE: PFT Thanks for letting me know. Ladies, can we get patient in for follow up to determine why he was so short of breath? Thanks ----- Message ----- From: German Harlene SQUIBB, RRT Sent: 10/21/2024   1:00 PM EST To: Lauraine JULIANNA Lites, NP Subject: PFT                                            Hi Sarah, I have Mr.Fittro here for his PFT. He came in SOB and his sats are 86% with his O2 on. I can attempt to do the PFT but he is saying he just doesn't think he can do it.

## 2024-11-02 ENCOUNTER — Inpatient Hospital Stay (HOSPITAL_BASED_OUTPATIENT_CLINIC_OR_DEPARTMENT_OTHER): Admitting: Internal Medicine

## 2024-11-02 ENCOUNTER — Inpatient Hospital Stay

## 2024-11-02 ENCOUNTER — Inpatient Hospital Stay: Attending: Internal Medicine

## 2024-11-02 ENCOUNTER — Inpatient Hospital Stay: Admitting: Internal Medicine

## 2024-11-02 VITALS — BP 117/68 | HR 88 | Temp 97.6°F | Resp 17 | Ht 68.0 in | Wt 211.0 lb

## 2024-11-02 DIAGNOSIS — Z5112 Encounter for antineoplastic immunotherapy: Secondary | ICD-10-CM | POA: Diagnosis present

## 2024-11-02 DIAGNOSIS — C3490 Malignant neoplasm of unspecified part of unspecified bronchus or lung: Secondary | ICD-10-CM

## 2024-11-02 DIAGNOSIS — D649 Anemia, unspecified: Secondary | ICD-10-CM | POA: Insufficient documentation

## 2024-11-02 DIAGNOSIS — C3431 Malignant neoplasm of lower lobe, right bronchus or lung: Secondary | ICD-10-CM

## 2024-11-02 LAB — CBC WITH DIFFERENTIAL (CANCER CENTER ONLY)
Abs Immature Granulocytes: 0.03 K/uL (ref 0.00–0.07)
Basophils Absolute: 0 K/uL (ref 0.0–0.1)
Basophils Relative: 0 %
Eosinophils Absolute: 0 K/uL (ref 0.0–0.5)
Eosinophils Relative: 0 %
HCT: 30.8 % — ABNORMAL LOW (ref 39.0–52.0)
Hemoglobin: 10 g/dL — ABNORMAL LOW (ref 13.0–17.0)
Immature Granulocytes: 1 %
Lymphocytes Relative: 9 %
Lymphs Abs: 0.6 K/uL — ABNORMAL LOW (ref 0.7–4.0)
MCH: 33.4 pg (ref 26.0–34.0)
MCHC: 32.5 g/dL (ref 30.0–36.0)
MCV: 103 fL — ABNORMAL HIGH (ref 80.0–100.0)
Monocytes Absolute: 0.4 K/uL (ref 0.1–1.0)
Monocytes Relative: 7 %
Neutro Abs: 5.6 K/uL (ref 1.7–7.7)
Neutrophils Relative %: 83 %
Platelet Count: 129 K/uL — ABNORMAL LOW (ref 150–400)
RBC: 2.99 MIL/uL — ABNORMAL LOW (ref 4.22–5.81)
RDW: 15.2 % (ref 11.5–15.5)
WBC Count: 6.6 K/uL (ref 4.0–10.5)
nRBC: 0 % (ref 0.0–0.2)

## 2024-11-02 LAB — CMP (CANCER CENTER ONLY)
ALT: 40 U/L (ref 0–44)
AST: 24 U/L (ref 15–41)
Albumin: 4.1 g/dL (ref 3.5–5.0)
Alkaline Phosphatase: 106 U/L (ref 38–126)
Anion gap: 10 (ref 5–15)
BUN: 21 mg/dL (ref 8–23)
CO2: 26 mmol/L (ref 22–32)
Calcium: 9.1 mg/dL (ref 8.9–10.3)
Chloride: 105 mmol/L (ref 98–111)
Creatinine: 1 mg/dL (ref 0.61–1.24)
GFR, Estimated: >60 mL/min
Glucose, Bld: 144 mg/dL — ABNORMAL HIGH (ref 70–99)
Potassium: 4.7 mmol/L (ref 3.5–5.1)
Sodium: 141 mmol/L (ref 135–145)
Total Bilirubin: 0.4 mg/dL (ref 0.0–1.2)
Total Protein: 6.6 g/dL (ref 6.5–8.1)

## 2024-11-02 LAB — SAMPLE TO BLOOD BANK

## 2024-11-02 MED ORDER — SODIUM CHLORIDE 0.9 % IV SOLN
1500.0000 mg | Freq: Once | INTRAVENOUS | Status: AC
  Administered 2024-11-02: 1500 mg via INTRAVENOUS
  Filled 2024-11-02: qty 30

## 2024-11-02 MED ORDER — SODIUM CHLORIDE 0.9 % IV SOLN: INTRAVENOUS | Status: DC

## 2024-11-02 NOTE — Progress Notes (Signed)
 "     [facility] Telephone:([phone]) [phone]   [phone]) [phone]  OFFICE PROGRESS NOTE  Koirala, [name], MD [address] [place] [address]  DIAGNOSIS: Stage IIIA (T4, N1, M0) non-small cell lung cancer, adenocarcinoma presented with right lower lobe lung mass in addition to suspicious right upper lobe lesion and right hilar lymphadenopathy diagnosed in April 2025.   Biomarker Findings HRD signature - Cannot Be Determined Microsatellite status - Cannot Be Determined ? Tumor Mutational Burden - Cannot Be Determined Genomic Findings For a complete list of the genes assayed, please refer to the Appendix. AKT2 amplification CDKN2A p16INK4a R80* and p14ARF P94L KRAS G12D ASXL1 W796fs*22 GNAS R201H U2AF1 S34F 7 Disease relevant genes with no reportable alterations: ALK, BRAF, EGFR, ERBB2, MET, RET, ROS1  PDL1 TPS: 0%  PRIOR THERAPY:  1) Neoadjuvant systemic chemotherapy with carboplatin  for AUC of 5, Alimta  500 Mg/M2 and Keytruda  200 Mg IV every 3 weeks.  Status post 1 cycle.  This was discontinued secondary to intolerance. 2) A course of concurrent chemoradiation with weekly carboplatin  for AUC of 2 and paclitaxel  45 Mg/M2.  First dose dose 32,025.  Status post 7 cycles.  Last dose was given 06/21/2024.  CURRENT THERAPY: Consideration of consolidation treatment with immunotherapy with durvalumab  1500 mg IV every 4 weeks.  First dose was given on August 11, 2024.  Status post 3 cycles.  INTERVAL HISTORY: Peter [name] 78 y.o. male returns to the clinic today for follow-up visit. Discussed the use of AI scribe software for clinical note transcription with the patient, who gave verbal consent to proceed.  History of Present Illness Peter [name] is a 78 year old male with stage III lung adenocarcinoma undergoing consolidation durvalumab  immunotherapy who presents for evaluation prior to cycle four.  He was diagnosed with stage III lung  adenocarcinoma in April 2025 and completed concurrent chemoradiation. He is currently receiving durvalumab  1500 mg IV every four weeks and has completed three cycles to date.  He reports improvement in symptoms since initiating supplemental oxygen, with overnight oxygen saturation now consistently 92-93% compared to prior readings of 84%. He is able to ambulate at night for bathroom use and drinks, then returns to bed and resumes oxygen therapy. He continues to experience a persistent cough, typically two episodes in the morning, but denies chest pain, hemoptysis, or significant dyspnea. His weight remains stable between 208-211 lbs.  Recent laboratory values include hemoglobin levels of 10, 10.2, and 10.1 g/dL over several visits. Recent laboratory values show a white blood cell count within normal limits and platelets at 120 x10^3/uL. He denies abnormal bleeding, bruising, fevers, chills, or sweats.    MEDICAL HISTORY: Past Medical History:  Diagnosis Date   Arthritis    At risk for sleep apnea    STOP-BANG= 5       SENT TO PCP 07-12-2015   CAD (coronary artery disease)    a. s/p PCI with DES x 3 to LAD in 2006 with bifurcation lesion and Plavix  indefinitely was recommended.   Chronic combined systolic and diastolic CHF (congestive heart failure) (HCC)    a. Prior EF normal but EF dropped to 30-35% in 07/2016 in setting of AFIB.   CKD (chronic kidney disease), stage III (HCC)    Depression    Dyslipidemia    Dyspnea    tired quick d/t Afib   Dyspnea on exertion    Dysrhythmia    A.FIB   GERD (gastroesophageal reflux disease)  History of GI bleed    History of kidney stones    History of MI (myocardial infarction)    11-05-2005--  periprocedural MI  (cardiac cath) per dr obie note   History of radiation therapy    Right lung-05/11/24-06/25/24- Dr. Lynwood Nasuti   Hypertension    Hypertriglyceridemia    Iron deficiency anemia    Myocardial infarction [facility])    Persistent atrial  fibrillation (HCC)    a. 07/2016, started on Eliquis  -> readmitted later that month for AF RVR/acute HF - initially unsuccessful DCCV following TEE, so loaded with amio with repeat successful DCCV 07/2816.   Right ureteral stone    S/P drug eluting coronary stent placement    x3  to LAD  2006   Sleep apnea    wears BIPAP, 14 up to 24    ALLERGIES:  is allergic to prednisone  and spironolactone .  MEDICATIONS:  Current Outpatient Medications  Medication Sig Dispense Refill   allopurinol  (ZYLOPRIM ) 100 MG tablet Take 100 mg by mouth at bedtime.     allopurinol  (ZYLOPRIM ) 300 MG tablet Take 300 mg by mouth in the morning.     amoxicillin -clavulanate (AUGMENTIN ) 875-125 MG tablet Take 1 tablet by mouth 2 (two) times daily. 14 tablet 0   atorvastatin  (LIPITOR) 40 MG tablet TAKE 1 TABLET EVERY DAY (NEED MD APPOINTMENT, CALL [phone]) 90 tablet 1   carvedilol  (COREG ) 25 MG tablet Take 1 tablet (25 mg total) by mouth 2 (two) times daily with a meal. 180 tablet 3   Cyanocobalamin  (VITAMIN B-12 PO) Take 1 tablet by mouth daily.     diphenhydramine -acetaminophen  (TYLENOL  PM) 25-500 MG TABS tablet Take 1 tablet by mouth at bedtime.     ferrous sulfate  325 (65 FE) MG tablet Take 1 tablet (325 mg total) by mouth every morning. 90 tablet 2   furosemide  (LASIX ) 40 MG tablet Take 1 tablet (40 mg total) by mouth daily. 90 tablet 3   hydrocortisone  1 % lotion Apply 1 Application topically 2 (two) times daily. 118 mL 0   ipratropium-albuterol  (DUONEB) 0.5-2.5 (3) MG/3ML SOLN Take 3 mLs by nebulization every 4 (four) hours as needed (shortness of breath or wheezing). 270 mL 0   [Paused] lisinopril  (ZESTRIL ) 40 MG tablet TAKE 1 TABLET EVERY DAY (Patient not taking: Reported on 10/22/2024) 90 tablet 3   nitroGLYCERIN  (NITROSTAT ) 0.4 MG SL tablet Place 1 tablet (0.4 mg total) under the tongue every 5 (five) minutes as needed for chest pain. X 3 doses 25 tablet 5   pantoprazole  (PROTONIX ) 40 MG tablet Take 40 mg  by mouth daily.     polyvinyl alcohol  (LIQUIFILM TEARS) 1.4 % ophthalmic solution Place 1 drop into both eyes daily as needed for dry eyes.     rivaroxaban  (XARELTO ) 20 MG TABS tablet Take 1 tablet (20 mg total) by mouth at bedtime. Okay to restart this medication on 02/10/24 90 tablet 1   No current facility-administered medications for this visit.    SURGICAL HISTORY:  Past Surgical History:  Procedure Laterality Date   BRONCHIAL BIOPSY  02/09/2024   Procedure: BRONCHOSCOPY, WITH BIOPSY;  Surgeon: Shelah Lamar RAMAN, MD;  Location: [facility] ENDOSCOPY;  Service: Pulmonary;;   BRONCHIAL BRUSHINGS  02/09/2024   Procedure: BRONCHOSCOPY, WITH BRUSH BIOPSY;  Surgeon: Shelah Lamar RAMAN, MD;  Location: [facility] ENDOSCOPY;  Service: Pulmonary;;   BRONCHIAL NEEDLE ASPIRATION BIOPSY  02/09/2024   Procedure: BRONCHOSCOPY, WITH NEEDLE ASPIRATION BIOPSY;  Surgeon: Shelah Lamar RAMAN, MD;  Location: [facility] ENDOSCOPY;  Service: Pulmonary;;   BRONCHIAL WASHINGS  02/09/2024   Procedure: IRRIGATION, BRONCHUS;  Surgeon: Shelah Lamar RAMAN, MD;  Location: [facility] ENDOSCOPY;  Service: Pulmonary;;   CARDIOVASCULAR STRESS TEST  01-06-2013  dr micky   normal perfusion study/  no ischemia or infarct/  normal LV function and wall motion , ef 57%   CARDIOVERSION N/A 08/05/2016   Procedure: CARDIOVERSION;  Surgeon: Jerel Balding, MD;  Location: [facility] ENDOSCOPY;  Service: Cardiovascular;  Laterality: N/A;   CARDIOVERSION N/A 08/08/2016   Procedure: CARDIOVERSION;  Surgeon: Oneil JAYSON Parchment, MD;  Location: [facility] ENDOSCOPY;  Service: Cardiovascular;  Laterality: N/A;   CORONARY ANGIOPLASTY  11-13-2005 dr obie   Successful touch up post-dilatation of the 3 tandem overlying Cypher stents in proximal to mid LAD (based on IVUS 0% stenosis and patent diagonal branch)/  Cutting Balloon Angioplasty of Ramus branch   CORONARY ANGIOPLASTY WITH STENT PLACEMENT  11-05-2005   dr wolm brodie   PCI and DES x3 to birfurcation lesion LAD and diagonal branch of LAD/   20% dLM,   30-40% RCA, ef 60%   CYSTOSCOPY WITH RETROGRADE PYELOGRAM, URETEROSCOPY AND STENT PLACEMENT Right 07/14/2015   Procedure: CYSTOSCOPY WITH RETROGRADE PYELOGRAM, URETEROSCOPY AND STENT PLACEMENT;  Surgeon: Ricardo Likens, MD;  Location: [facility];  Service: Urology;  Laterality: Right;   ELECTROPHYSIOLOGIC STUDY N/A 10/24/2016   Procedure: Atrial Fibrillation Ablation;  Surgeon: Will Gladis Norton, MD;  Location: [facility] INVASIVE CV LAB;  Service: Cardiovascular;  Laterality: N/A;   EXTRACORPOREAL SHOCK WAVE LITHOTRIPSY  yrs ago   HEMORRHOID SURGERY N/A 10/24/2017   Procedure: HEMORRHOIDECTOMY;  Surgeon: Teresa Lonni HERO, MD;  Location: [facility] OR;  Service: General;  Laterality: N/A;   HERNIA REPAIR Bilateral 2000   Inguinal Hernia   IR GENERIC HISTORICAL  10/23/2016   IR FLUORO GUIDE CV LINE RIGHT 10/23/2016 Ozell Specking, MD [facility]   IR GENERIC HISTORICAL  10/23/2016   IR US  GUIDE VASC ACCESS RIGHT 10/23/2016 Ozell Specking, MD [facility]   LEFT URETEROSCOPIC STONE EXTRACTION  04/10/2006   STONE EXTRACTION WITH BASKET Right 07/14/2015   Procedure: STONE EXTRACTION WITH BASKET;  Surgeon: Ricardo Likens, MD;  Location: [facility];  Service: Urology;  Laterality: Right;   TEE WITHOUT CARDIOVERSION N/A 08/05/2016   Procedure: TRANSESOPHAGEAL ECHOCARDIOGRAM (TEE);  Surgeon: Jerel Balding, MD;  Location: [facility] ENDOSCOPY;  Service: Cardiovascular;  Laterality: N/A;   TOTAL HIP ARTHROPLASTY Right 07/16/2022   Procedure: RIGHT TOTAL HIP ARTHROPLASTY ANTERIOR APPROACH;  Surgeon: Jerri Kay HERO, MD;  Location: [facility] OR;  Service: Orthopedics;  Laterality: Right;  3-C   TRANSTHORACIC ECHOCARDIOGRAM  02/04/2012   grade I diastolic dysfunction/  ef 55-60%/  trivial MR and TR/  mild LAE   VIDEO BRONCHOSCOPY WITH ENDOBRONCHIAL NAVIGATION Right 02/09/2024   Procedure: VIDEO BRONCHOSCOPY WITH ENDOBRONCHIAL NAVIGATION;  Surgeon: Shelah Lamar RAMAN, MD;  Location: [facility] ENDOSCOPY;   Service: Pulmonary;  Laterality: Right;  WITH FLOURO    REVIEW OF SYSTEMS:  A comprehensive review of systems was negative except for: Constitutional: positive for fatigue Respiratory: positive for dyspnea on exertion   PHYSICAL EXAMINATION: General appearance: alert, cooperative, fatigued, and no distress Head: Normocephalic, without obvious abnormality, atraumatic Neck: no adenopathy, no JVD, supple, symmetrical, trachea midline, and thyroid  not enlarged, symmetric, no tenderness/mass/nodules Lymph nodes: Cervical, supraclavicular, and axillary nodes normal. Resp: clear to auscultation bilaterally Back: symmetric, no curvature. ROM normal. No CVA tenderness. Cardio: regular rate and rhythm, S1, S2 normal, no murmur, click, rub or  gallop GI: soft, non-tender; bowel sounds normal; no masses,  no organomegaly Extremities: extremities normal, atraumatic, no cyanosis or edema  ECOG PERFORMANCE STATUS: 1 - Symptomatic but completely ambulatory  Blood pressure 117/68, pulse 88, temperature 97.6 F (36.4 C), temperature source Temporal, resp. rate 17, height 5' 8 (1.727 m), weight 211 lb (95.7 kg), SpO2 96%.  LABORATORY DATA: Lab Results  Component Value Date   WBC 6.6 11/02/2024   HGB 10.0 (L) 11/02/2024   HCT 30.8 (L) 11/02/2024   MCV 103.0 (H) 11/02/2024   PLT 129 (L) 11/02/2024      Chemistry      Component Value Date/Time   NA 141 11/02/2024 0940   K 4.7 11/02/2024 0940   CL 105 11/02/2024 0940   CO2 26 11/02/2024 0940   BUN 21 11/02/2024 0940   CREATININE 1.00 11/02/2024 0940   CREATININE 1.72 (H) 10/16/2016 1115      Component Value Date/Time   CALCIUM  9.1 11/02/2024 0940   ALKPHOS 106 11/02/2024 0940   AST 24 11/02/2024 0940   ALT 40 11/02/2024 0940   BILITOT 0.4 11/02/2024 0940       RADIOGRAPHIC STUDIES: DG Chest 2 View Result Date: 10/22/2024 CLINICAL DATA:  Dyspnea EXAM: CHEST - 2 VIEW COMPARISON:  August 23, 2024 FINDINGS: Stable cardiomediastinal  silhouette. Small right pleural effusion is noted. New right perihilar opacity is noted concerning for possible pneumonia or atelectasis. Otherwise stable bilateral lung opacities concerning for scarring. The visualized skeletal structures are unremarkable. IMPRESSION: New right perihilar opacity is noted concerning for possible pneumonia or atelectasis. Small right pleural effusion is noted. Electronically Signed   By: Lynwood Landy Raddle M.D.   On: 10/22/2024 10:10     ASSESSMENT AND PLAN: This is a very pleasant 78 years old white male with Stage IIIA (T4, N1, M0) non-small cell lung cancer, adenocarcinoma presented with right lower lobe lung mass in addition to suspicious right upper lobe lesion and right hilar lymphadenopathy diagnosed in April 2025.  Molecular studies showed no actionable mutations and PD-L1 expression was negative. The patient is currently undergoing neoadjuvant chemoimmunotherapy with carboplatin  AUC of 5, Alimta  500 Mg/M2 and Keytruda  200 Mg IV every 3 weeks status post 1 cycle.  He tolerated the first cycle of his treatment well except for mild fatigue and mild skin rash on the face. The patient has intolerance to the neoadjuvant chemoimmunotherapy and this was discontinued. He underwent a course of concurrent chemoradiation with weekly carboplatin  for AUC of 2 and paclitaxel  45 Mg/M2.  First dose expected May 10, 2024.  Status post 7 cycles.  He is currently undergoing consolidation treatment with immunotherapy with durvalumab  1500 mg IV every 4 weeks status post 3 cycle.  He is tolerating this treatment fairly well. Assessment and Plan Assessment & Plan Stage IIIA lung adenocarcinoma, status post chemoradiation, on consolidation immunotherapy He remains clinically stable following concurrent chemoradiation and is tolerating consolidation durvalumab  without new or concerning symptoms. Oxygenation has improved and there is no evidence of disease progression or acute  complications. - Administered cycle four of durvalumab  consolidation immunotherapy today. - Ordered restaging scan in three weeks. - Scheduled follow-up in four weeks to review imaging and assess response.  Anemia Chronic mild anemia with hemoglobin approximately 10 g/dL, asymptomatic, with stable other cell lines and no evidence of acute worsening or need for intervention. The patient was advised to call immediately if he has any other concerning symptoms in the interval.  The patient voices understanding of current  disease status and treatment options and is in agreement with the current care plan.  All questions were answered. The patient knows to call the clinic with any problems, questions or concerns. We can certainly see the patient much sooner if necessary.  The total time spent in the appointment was 20 minutes.  Disclaimer: This note was dictated with voice recognition software. Similar sounding words can inadvertently be transcribed and may not be corrected upon review.        "

## 2024-11-02 NOTE — Patient Instructions (Signed)

## 2024-11-03 ENCOUNTER — Inpatient Hospital Stay

## 2024-11-08 ENCOUNTER — Encounter: Payer: Self-pay | Admitting: Physician Assistant

## 2024-11-08 ENCOUNTER — Encounter: Payer: Self-pay | Admitting: Internal Medicine

## 2024-11-12 ENCOUNTER — Other Ambulatory Visit: Payer: Self-pay

## 2024-11-14 ENCOUNTER — Other Ambulatory Visit: Payer: Self-pay

## 2024-11-15 ENCOUNTER — Other Ambulatory Visit: Payer: Self-pay | Admitting: Cardiology

## 2024-11-15 ENCOUNTER — Encounter (HOSPITAL_BASED_OUTPATIENT_CLINIC_OR_DEPARTMENT_OTHER): Payer: Self-pay | Admitting: Pulmonary Disease

## 2024-11-15 ENCOUNTER — Encounter: Payer: Self-pay | Admitting: Internal Medicine

## 2024-11-15 ENCOUNTER — Encounter: Payer: Self-pay | Admitting: Physician Assistant

## 2024-11-15 ENCOUNTER — Other Ambulatory Visit (HOSPITAL_BASED_OUTPATIENT_CLINIC_OR_DEPARTMENT_OTHER): Payer: Self-pay

## 2024-11-15 ENCOUNTER — Ambulatory Visit (HOSPITAL_BASED_OUTPATIENT_CLINIC_OR_DEPARTMENT_OTHER): Admitting: Pulmonary Disease

## 2024-11-15 VITALS — BP 116/68 | HR 91 | Ht 68.0 in | Wt 210.0 lb

## 2024-11-15 DIAGNOSIS — R29898 Other symptoms and signs involving the musculoskeletal system: Secondary | ICD-10-CM

## 2024-11-15 DIAGNOSIS — J701 Chronic and other pulmonary manifestations due to radiation: Secondary | ICD-10-CM

## 2024-11-15 DIAGNOSIS — J9611 Chronic respiratory failure with hypoxia: Secondary | ICD-10-CM | POA: Diagnosis not present

## 2024-11-15 DIAGNOSIS — J984 Other disorders of lung: Secondary | ICD-10-CM | POA: Diagnosis not present

## 2024-11-15 MED ORDER — BUDESONIDE 0.5 MG/2ML IN SUSP: 0.5000 mg | Freq: Every day | RESPIRATORY_TRACT | 5 refills | Status: AC

## 2024-11-15 MED ORDER — PREDNISONE 10 MG PO TABS: ORAL_TABLET | ORAL | 0 refills | Status: AC

## 2024-11-15 NOTE — Progress Notes (Signed)
 "  History of Present Illness Peter [name] is a 79 y.o. male with stage IIIA non-small cell lung cancer . He is a patient of Dr. Shelah   11/15/2024 Discussed the use of AI scribe software for clinical note transcription with the patient, who gave verbal consent to proceed.  History of Present Illness Peter [name] is a 79 year old male with stage IIIA non-small cell lung cancer,  chronic respiratory failure, OSA on CPAP, restrictive lung disease and pulmonary fibrosis, chronic systolic heart failure, atrial fibrillation who presents with worsening chronic cough.  He has experienced a chronic cough for several months, with symptoms worsening over the past month. The cough is easily triggered and not associated with wheezing or shortness of breath. A previous episode in April was effectively resolved with Augmentin  prescribed by NP Lauraine Lites. Current treatments include sugar-free Jolly Ranchers, Delsym  cough syrup, Tessalon  Pearls twice daily, and a completed course of Augmentin  on October 29, 2024, which provided some symptom improvement.  He has a history of pulmonary fibrosis and requires supplemental oxygen. He uses a nebulizer twice daily, initially every four hours for the first three days. Oxygen saturation drops significantly without supplemental oxygen, particularly after activities like showering or moving around the house, with episodes of desaturation to 85%, causing severe weakness and difficulty standing without assistance.  Currently on 2 liters of oxygen, increasing to 3 liters during activities outside the home. He has experienced significant deconditioning over the past year, with difficulty walking even short distances within his home. Walking from one room to another is exhausting, and he has lost muscle mass, impacting his ability to breathe and perform daily activities. He has not engaged in physical activity for a year and reports significant weakness.  He has a history  of cancer and has been receiving immunotherapy since October 2025. A follow-up CT scan is scheduled to assess the response to treatment. He mentions that his cancer has not spread, but the breathing issues are more concerning.  Vitals:   11/15/24 1020  BP: 116/68  Pulse: 82  Height: 5' 8 (1.727 m)  Weight: 210 lb (95.3 kg)  SpO2: 97% Comment: on 2L POC  BMI (Calculated): 31.94        Latest Ref Rng & Units 11/02/2024    9:40 AM 10/06/2024    9:29 AM 09/21/2024   11:42 AM  CBC  WBC 4.0 - 10.5 K/uL 6.6  5.5  6.2   Hemoglobin 13.0 - 17.0 g/dL 89.9  89.7  89.8   Hematocrit 39.0 - 52.0 % 30.8  31.5  30.8   Platelets 150 - 400 K/uL 129  125  143.0        Latest Ref Rng & Units 11/02/2024    9:40 AM 10/06/2024    9:29 AM 09/08/2024   10:22 AM  BMP  Glucose 70 - 99 mg/dL 855  865  843   BUN 8 - 23 mg/dL 21  22  20    Creatinine 0.61 - 1.24 mg/dL 8.99  8.96  8.84   Sodium 135 - 145 mmol/L 141  141  141   Potassium 3.5 - 5.1 mmol/L 4.7  4.4  4.4   Chloride 98 - 111 mmol/L 105  107  108   CO2 22 - 32 mmol/L 26  24  25    Calcium  8.9 - 10.3 mg/dL 9.1  9.5  9.4     BNP    Component Value Date/Time   BNP 416.8 (H) 01/23/2024  0220    ProBNP    Component Value Date/Time   PROBNP 144.7 (H) 02/03/2012 1854    PFT    Component Value Date/Time   FEV1PRE 1.13 03/05/2024 1100   FEV1POST 1.24 03/05/2024 1100   FVCPRE 1.24 03/05/2024 1100   FVCPOST 1.40 03/05/2024 1100   TLC 2.92 03/05/2024 1100   DLCOUNC 11.81 03/05/2024 1100   PREFEV1FVCRT 92 03/05/2024 1100   PSTFEV1FVCRT 89 03/05/2024 1100    DG Chest 2 View Result Date: 10/22/2024 CLINICAL DATA:  Dyspnea EXAM: CHEST - 2 VIEW COMPARISON:  August 23, 2024 FINDINGS: Stable cardiomediastinal silhouette. Small right pleural effusion is noted. New right perihilar opacity is noted concerning for possible pneumonia or atelectasis. Otherwise stable bilateral lung opacities concerning for scarring. The visualized skeletal  structures are unremarkable. IMPRESSION: New right perihilar opacity is noted concerning for possible pneumonia or atelectasis. Small right pleural effusion is noted. Electronically Signed   By: Lynwood Landy Raddle M.D.   On: 10/22/2024 10:10      Past medical hx Past Medical History:  Diagnosis Date   Arthritis    At risk for sleep apnea    STOP-BANG= 5       SENT TO PCP 07-12-2015   CAD (coronary artery disease)    a. s/p PCI with DES x 3 to LAD in 2006 with bifurcation lesion and Plavix  indefinitely was recommended.   Chronic combined systolic and diastolic CHF (congestive heart failure) (HCC)    a. Prior EF normal but EF dropped to 30-35% in 07/2016 in setting of AFIB.   CKD (chronic kidney disease), stage III (HCC)    Depression    Dyslipidemia    Dyspnea    tired quick d/t Afib   Dyspnea on exertion    Dysrhythmia    A.FIB   GERD (gastroesophageal reflux disease)    History of GI bleed    History of kidney stones    History of MI (myocardial infarction)    11-05-2005--  periprocedural MI  (cardiac cath) per dr obie note   History of radiation therapy    Right lung-05/11/24-06/25/24- Dr. Lynwood Nasuti   Hypertension    Hypertriglyceridemia    Iron deficiency anemia    Myocardial infarction [facility])    Persistent atrial fibrillation (HCC)    a. 07/2016, started on Eliquis  -> readmitted later that month for AF RVR/acute HF - initially unsuccessful DCCV following TEE, so loaded with amio with repeat successful DCCV 07/2816.   Right ureteral stone    S/P drug eluting coronary stent placement    x3  to LAD  2006   Sleep apnea    wears BIPAP, 13 up to 24     Social History   Tobacco Use   Smoking status: Never    Passive exposure: Past   Smokeless tobacco: Never  Vaping Use   Vaping status: Never Used  Substance Use Topics   Alcohol  use: No    Comment: 1 beer a year   Drug use: No    Mr.Kalina reports that he has never smoked. He has been exposed to tobacco smoke. He  has never used smokeless tobacco. He reports that he does not drink alcohol  and does not use drugs.  Tobacco Cessation: Counseling given: Not Answered Never smoker   Past surgical hx, Family hx, Social hx all reviewed.  Current Outpatient Medications on File Prior to Visit  Medication Sig   allopurinol  (ZYLOPRIM ) 100 MG tablet Take 100 mg by mouth at bedtime.  allopurinol  (ZYLOPRIM ) 300 MG tablet Take 300 mg by mouth in the morning.   atorvastatin  (LIPITOR) 40 MG tablet TAKE 1 TABLET EVERY DAY (NEED MD APPOINTMENT, CALL [phone])   carvedilol  (COREG ) 25 MG tablet Take 1 tablet (25 mg total) by mouth 2 (two) times daily with a meal.   Cyanocobalamin  (VITAMIN B-12 PO) Take 1 tablet by mouth daily.   diphenhydramine -acetaminophen  (TYLENOL  PM) 25-500 MG TABS tablet Take 1 tablet by mouth at bedtime.   ferrous sulfate  325 (65 FE) MG tablet Take 1 tablet (325 mg total) by mouth every morning.   furosemide  (LASIX ) 40 MG tablet Take 1 tablet (40 mg total) by mouth daily.   ipratropium-albuterol  (DUONEB) 0.5-2.5 (3) MG/3ML SOLN Take 3 mLs by nebulization every 4 (four) hours as needed (shortness of breath or wheezing).   nitroGLYCERIN  (NITROSTAT ) 0.4 MG SL tablet Place 1 tablet (0.4 mg total) under the tongue every 5 (five) minutes as needed for chest pain. X 3 doses   pantoprazole  (PROTONIX ) 40 MG tablet Take 40 mg by mouth daily.   polyvinyl alcohol  (LIQUIFILM TEARS) 1.4 % ophthalmic solution Place 1 drop into both eyes daily as needed for dry eyes.   rivaroxaban  (XARELTO ) 20 MG TABS tablet Take 1 tablet (20 mg total) by mouth at bedtime. Okay to restart this medication on 02/10/24   amoxicillin -clavulanate (AUGMENTIN ) 875-125 MG tablet Take 1 tablet by mouth 2 (two) times daily. (Patient not taking: Reported on 11/15/2024)   hydrocortisone  1 % lotion Apply 1 Application topically 2 (two) times daily. (Patient not taking: Reported on 11/15/2024)   [Paused] lisinopril  (ZESTRIL ) 40 MG tablet TAKE 1  TABLET EVERY DAY (Patient not taking: Reported on 11/15/2024)   No current facility-administered medications on file prior to visit.     Allergies  Allergen Reactions   Prednisone  Other (See Comments)    Made BS increase to 500- discovered on 01/22/24 Cannot take dose pack   Spironolactone  Other (See Comments)    Tender breast   Review of Systems  Constitutional:  Negative for chills, diaphoresis, fever, malaise/fatigue and weight loss.  HENT:  Negative for congestion.   Respiratory:  Positive for cough, sputum production and shortness of breath. Negative for hemoptysis and wheezing.   Cardiovascular:  Negative for chest pain, palpitations and leg swelling.     Vital Signs BP 116/68   Pulse 82   Ht 5' 8 (1.727 m)   Wt 210 lb (95.3 kg)   SpO2 97% Comment: on 2L POC  BMI 31.93 kg/m    Physical Exam: Physical Exam GENERAL: Well appearing, no acute distress. HEAD EARS NOSE THROAT: Normocephalic, atraumatic. EYES: Extraocular movements intact, no scleral icterus. RESPIRATORY: Faint bibasilar crackles otherwise clear to auscultation bilaterally CARDIOVASCULAR: Regular rate and rhythm, no murmurs, rubs, or gallops, no jugular venous distention. EXTREMITIES: No edema, no tenderness. NEUROLOGICAL: Alert and oriented x4, cranial nerves II-XII grossly intact. PSYCHIATRIC: Normal mood, normal affect.  Imaging: CT Chest 07/22/24 - Visualized lung fields with no change compared to 06/2024 with masslike, cavitary consolidation in RUL and superior segment of RLL, small right pleural effusion, left trace effusion, mild bibasilar fibrosis  PFT: 03/05/24 FVC 1.40 (40%) FEV1 1.24 (50%) Ratio 89  TLC 46% DLCO 54%. Significant bronchodilator response in FVC Interpretation: Severe restrictive defect with moderately reduced gas exchange. +BD response.    Assessment/Plan Assessment & Plan   Severe restrictive lung defect with moderately reduced DLCO Post radiation fibrosis in possible  flare Suspected pulmonary hypertension WHO 3 Mild exacerbation.  Ambulatory O2 with no desaturation - CONTINUE Duoneb twice a day. Can use up to 3 times a day when symptoms worse - Prednisone  taper. May increase blood sugar. Recommend checking levels - Will price check steroid nebulizer > $5.46 per month. Will order budesonide  daily - REFER to [facility] physical therapy  >Unable to accommodate oxygen support. Home health PT ordered  Chronic hypoxemic respiratory failure 2/2 above - Ambulatory O2 with stable O2 on 2L pulsed - Discussed red flags including respiratory distress, persistent hypoxemia >90% above prescribed level of oxygen.    I have spent a total time of 45-minutes on the day of the appointment including chart review, data review, collecting history, coordinating care and discussing medical diagnosis and plan with the patient/family. Past medical history, allergies, medications were reviewed. Pertinent imaging, labs and tests included in this note have been reviewed and interpreted independently by me.  Cleland Simkins Slater Staff, MD 11/15/2024  10:43 AM          "

## 2024-11-15 NOTE — Patient Instructions (Addendum)
 Severe restrictive lung defect with moderately reduced DLCO Post radiation fibrosis in possible flare Suspected pulmonary hypertension WHO 3 Mild exacerbation  - CONTINUE Duoneb twice a day. Can use up to 3 times a day when symptoms worse - Prednisone  taper. May increase blood sugar. Recommend checking levels - Will price check steroid nebulizer  - REFER to [facility] physical therapy  Chronic hypoxemic respiratory failure 2/2 above - Ambulatory O2 with stable O2 on 2L pulsed - Discussed red flags including respiratory distress, persistent hypoxemia >90% above prescribed level of oxygen.

## 2024-11-23 ENCOUNTER — Other Ambulatory Visit: Payer: Self-pay

## 2024-11-23 ENCOUNTER — Ambulatory Visit (HOSPITAL_COMMUNITY)
Admission: RE | Admit: 2024-11-23 | Discharge: 2024-11-23 | Disposition: A | Discharge status: Home / Self Care | Source: Ambulatory Visit | Attending: Internal Medicine | Admitting: Internal Medicine

## 2024-11-23 DIAGNOSIS — C3490 Malignant neoplasm of unspecified part of unspecified bronchus or lung: Secondary | ICD-10-CM | POA: Insufficient documentation

## 2024-11-23 MED ORDER — IOHEXOL 300 MG/ML  SOLN
75.0000 mL | Freq: Once | INTRAMUSCULAR | Status: AC | PRN
  Administered 2024-11-23: 75 mL via INTRAVENOUS

## 2024-11-24 ENCOUNTER — Telehealth (HOSPITAL_BASED_OUTPATIENT_CLINIC_OR_DEPARTMENT_OTHER): Payer: Self-pay

## 2024-11-24 NOTE — Telephone Encounter (Signed)
 Copied from CRM #[id number]. Topic: Clinical - Medication Question >> Nov 24, 2024 11:52 AM Rozanna MATSU wrote: Reason for CRM:pt wants to know if he needs to take  budesonide  (PULMICORT ) 0.5 MG/2ML nebulizer solution and predniSONE  at the same time.

## 2024-11-24 NOTE — Progress Notes (Signed)
 [facility] OFFICE PROGRESS NOTE  Koirala, [name], MD [address] [place] [address]  DIAGNOSIS: Stage IIIA (T4, N1, M0) non-small cell lung cancer, adenocarcinoma presented with right lower lobe lung mass in addition to suspicious right upper lobe lesion and right hilar lymphadenopathy diagnosed in April 2025.    Biomarker Findings HRD signature - Cannot Be Determined Microsatellite status - Cannot Be Determined ? Tumor Mutational Burden - Cannot Be Determined Genomic Findings For a complete list of the genes assayed, please refer to the Appendix. AKT2 amplification CDKN2A p16INK4a R80* and p14ARF P94L KRAS G12D ASXL1 W796fs*22 GNAS R201H U2AF1 S34F 7 Disease relevant genes with no reportable alterations: ALK, BRAF, EGFR, ERBB2, MET, RET, ROS1   PDL1 TPS: 0%  PRIOR THERAPY:  1) Neoadjuvant systemic chemotherapy with carboplatin  for AUC of 5, Alimta  500 Mg/M2 and Keytruda  200 Mg IV every 3 weeks.  Status post 1 cycle.  This was discontinued secondary to intolerance. 2) A course of concurrent chemoradiation with weekly carboplatin  for AUC of 2 and paclitaxel  45 Mg/M2.  First dose dose 32,025.  Status post 7 cycles.  Last dose was given 06/21/2024.  CURRENT THERAPY:  Consideration of consolidation treatment with immunotherapy with durvalumab  1500 mg IV every 4 weeks.  First dose was given on August 11, 2024.  Status post 3 cycles.    INTERVAL HISTORY: Peter [name] 79 y.o. male returns to clinic today for a follow-up visit.  The patient was last seen in the clinic 1 month ago by Dr. Sherrod.  The patient is currently on consolidation immunotherapy with Imfinzi  1500 mg IV every 4 weeks.  He is status post 3 cycles.  He tolerates it well.   He is currently receiving immunotherapy for lung cancer and has not experienced adverse effects, including fatigue, nausea, rash, or changes in breathing or swallowing. He expresses uncertainty regarding the  efficacy of immunotherapy but has not noted any new or worsening symptoms related to his malignancy.  His clinical course is primarily limited by severe pulmonary fibrosis. He remains on home oxygen at 2 to 2.5 L/min and experiences significant exertional dyspnea, with oxygen saturations dropping to 88% during minimal activity such as walking to the car. He is unable to take deep breaths and is limited in activities of daily living. He has a chronic dry cough, sometimes triggered by talking, with variable severity; he occasionally uses cough drops.   He denies productive cough, hemoptysis, or wheezing. He has not noticed any recent changes in his breathing over the past several weeks to months, though his exercise tolerance remains poor. He uses a nebulizer daily and recently completed a tapering course of prednisone , initiated after his most recent scan per pulmonology recommendation. He also uses a daily steroid inhaler. He has not received antibiotics recently. Recent imaging demonstrated fibrosis and a moderate pleural effusion. He is scheduled to begin pulmonary rehabilitation on 12/18/23 and will follow up with pulmonology.  He denies nausea, vomiting, diarrhea, constipation, headaches, vision changes, or rashes. No new or unusual symptoms outside of his chronic pulmonary issues were reported. Blood pressure was elevated this morning, but he states it is usually well controlled at home. He continues to monitor his blood pressure and takes Lasix  as part of his regimen.  He recently had a restaging CT scan performed.  He is here today for evaluation and to review his scan results before undergoing cycle #4.   MEDICAL HISTORY: Past Medical History:  Diagnosis Date  Arthritis    At risk for sleep apnea    STOP-BANG= 5       SENT TO PCP 07-12-2015   CAD (coronary artery disease)    a. s/p PCI with DES x 3 to LAD in 2006 with bifurcation lesion and Plavix  indefinitely was recommended.   Chronic  combined systolic and diastolic CHF (congestive heart failure) (HCC)    a. Prior EF normal but EF dropped to 30-35% in 07/2016 in setting of AFIB.   CKD (chronic kidney disease), stage III (HCC)    Depression    Dyslipidemia    Dyspnea    tired quick d/t Afib   Dyspnea on exertion    Dysrhythmia    A.FIB   GERD (gastroesophageal reflux disease)    History of GI bleed    History of kidney stones    History of MI (myocardial infarction)    11-05-2005--  periprocedural MI  (cardiac cath) per dr obie note   History of radiation therapy    Right lung-05/11/24-06/25/24- Dr. Lynwood Nasuti   Hypertension    Hypertriglyceridemia    Iron deficiency anemia    Myocardial infarction [facility])    Persistent atrial fibrillation (HCC)    a. 07/2016, started on Eliquis  -> readmitted later that month for AF RVR/acute HF - initially unsuccessful DCCV following TEE, so loaded with amio with repeat successful DCCV 07/2816.   Right ureteral stone    S/P drug eluting coronary stent placement    x3  to LAD  2006   Sleep apnea    wears BIPAP, 14 up to 24    ALLERGIES:  is allergic to prednisone  and spironolactone .  MEDICATIONS:  Current Outpatient Medications  Medication Sig Dispense Refill   allopurinol  (ZYLOPRIM ) 100 MG tablet Take 100 mg by mouth at bedtime.     allopurinol  (ZYLOPRIM ) 300 MG tablet Take 300 mg by mouth in the morning.     amoxicillin -clavulanate (AUGMENTIN ) 875-125 MG tablet Take 1 tablet by mouth 2 (two) times daily. (Patient not taking: Reported on 11/15/2024) 14 tablet 0   atorvastatin  (LIPITOR) 40 MG tablet TAKE 1 TABLET EVERY DAY (NEED MD APPOINTMENT, CALL [phone]) 90 tablet 3   budesonide  (PULMICORT ) 0.5 MG/2ML nebulizer solution Take 2 mLs (0.5 mg total) by nebulization daily. 120 mL 5   carvedilol  (COREG ) 25 MG tablet Take 1 tablet (25 mg total) by mouth 2 (two) times daily with a meal. 180 tablet 3   Cyanocobalamin  (VITAMIN B-12 PO) Take 1 tablet by mouth daily.      diphenhydramine -acetaminophen  (TYLENOL  PM) 25-500 MG TABS tablet Take 1 tablet by mouth at bedtime.     ferrous sulfate  325 (65 FE) MG tablet Take 1 tablet (325 mg total) by mouth every morning. 90 tablet 2   furosemide  (LASIX ) 40 MG tablet Take 1 tablet (40 mg total) by mouth daily. 90 tablet 3   hydrocortisone  1 % lotion Apply 1 Application topically 2 (two) times daily. (Patient not taking: Reported on 11/15/2024) 118 mL 0   ipratropium-albuterol  (DUONEB) 0.5-2.5 (3) MG/3ML SOLN Take 3 mLs by nebulization every 4 (four) hours as needed (shortness of breath or wheezing). 270 mL 0   [Paused] lisinopril  (ZESTRIL ) 40 MG tablet TAKE 1 TABLET EVERY DAY (Patient not taking: Reported on 11/15/2024) 90 tablet 3   nitroGLYCERIN  (NITROSTAT ) 0.4 MG SL tablet Place 1 tablet (0.4 mg total) under the tongue every 5 (five) minutes as needed for chest pain. X 3 doses 25 tablet 5   pantoprazole  (  PROTONIX ) 40 MG tablet Take 40 mg by mouth daily.     polyvinyl alcohol  (LIQUIFILM TEARS) 1.4 % ophthalmic solution Place 1 drop into both eyes daily as needed for dry eyes.     rivaroxaban  (XARELTO ) 20 MG TABS tablet Take 1 tablet (20 mg total) by mouth at bedtime. Okay to restart this medication on 02/10/24 90 tablet 1   No current facility-administered medications for this visit.    SURGICAL HISTORY:  Past Surgical History:  Procedure Laterality Date   BRONCHIAL BIOPSY  02/09/2024   Procedure: BRONCHOSCOPY, WITH BIOPSY;  Surgeon: Shelah Lamar RAMAN, MD;  Location: [facility] ENDOSCOPY;  Service: Pulmonary;;   BRONCHIAL BRUSHINGS  02/09/2024   Procedure: BRONCHOSCOPY, WITH BRUSH BIOPSY;  Surgeon: Shelah Lamar RAMAN, MD;  Location: [facility] ENDOSCOPY;  Service: Pulmonary;;   BRONCHIAL NEEDLE ASPIRATION BIOPSY  02/09/2024   Procedure: BRONCHOSCOPY, WITH NEEDLE ASPIRATION BIOPSY;  Surgeon: Shelah Lamar RAMAN, MD;  Location: [facility] ENDOSCOPY;  Service: Pulmonary;;   BRONCHIAL WASHINGS  02/09/2024   Procedure: IRRIGATION, BRONCHUS;  Surgeon: Shelah Lamar RAMAN, MD;  Location: [facility] ENDOSCOPY;  Service: Pulmonary;;   CARDIOVASCULAR STRESS TEST  01-06-2013  dr micky   normal perfusion study/  no ischemia or infarct/  normal LV function and wall motion , ef 57%   CARDIOVERSION N/A 08/05/2016   Procedure: CARDIOVERSION;  Surgeon: Jerel Balding, MD;  Location: [facility] ENDOSCOPY;  Service: Cardiovascular;  Laterality: N/A;   CARDIOVERSION N/A 08/08/2016   Procedure: CARDIOVERSION;  Surgeon: Oneil JAYSON Parchment, MD;  Location: [facility] ENDOSCOPY;  Service: Cardiovascular;  Laterality: N/A;   CORONARY ANGIOPLASTY  11-13-2005 dr obie   Successful touch up post-dilatation of the 3 tandem overlying Cypher stents in proximal to mid LAD (based on IVUS 0% stenosis and patent diagonal branch)/  Cutting Balloon Angioplasty of Ramus branch   CORONARY ANGIOPLASTY WITH STENT PLACEMENT  11-05-2005   dr wolm brodie   PCI and DES x3 to birfurcation lesion LAD and diagonal branch of LAD/   20% dLM,  30-40% RCA, ef 60%   CYSTOSCOPY WITH RETROGRADE PYELOGRAM, URETEROSCOPY AND STENT PLACEMENT Right 07/14/2015   Procedure: CYSTOSCOPY WITH RETROGRADE PYELOGRAM, URETEROSCOPY AND STENT PLACEMENT;  Surgeon: Ricardo Likens, MD;  Location: [facility];  Service: Urology;  Laterality: Right;   ELECTROPHYSIOLOGIC STUDY N/A 10/24/2016   Procedure: Atrial Fibrillation Ablation;  Surgeon: Will Gladis Norton, MD;  Location: [facility] INVASIVE CV LAB;  Service: Cardiovascular;  Laterality: N/A;   EXTRACORPOREAL SHOCK WAVE LITHOTRIPSY  yrs ago   HEMORRHOID SURGERY N/A 10/24/2017   Procedure: HEMORRHOIDECTOMY;  Surgeon: Teresa Lonni HERO, MD;  Location: [facility] OR;  Service: General;  Laterality: N/A;   HERNIA REPAIR Bilateral 2000   Inguinal Hernia   IR GENERIC HISTORICAL  10/23/2016   IR FLUORO GUIDE CV LINE RIGHT 10/23/2016 Ozell Specking, MD [facility]-INTERV RAD   IR GENERIC HISTORICAL  10/23/2016   IR US  GUIDE VASC ACCESS RIGHT 10/23/2016 Ozell Specking, MD [facility]INTER[facility]   LEFT URETEROSCOPIC STONE  EXTRACTION  04/10/2006   STONE EXTRACTION WITH BASKET Right 07/14/2015   Procedure: STONE EXTRACTION WITH BASKET;  Surgeon: Ricardo Likens, MD;  Location: [facility];  Service: Urology;  Laterality: Right;   TEE WITHOUT CARDIOVERSION N/A 08/05/2016   Procedure: TRANSESOPHAGEAL ECHOCARDIOGRAM (TEE);  Surgeon: Jerel Balding, MD;  Location: [facility] ENDOSCOPY;  Service: Cardiovascular;  Laterality: N/A;   TOTAL HIP ARTHROPLASTY Right 07/16/2022   Procedure: RIGHT TOTAL HIP ARTHROPLASTY ANTERIOR APPROACH;  Surgeon: Jerri Kay HERO, MD;  Location:  [facility] OR;  Service: Orthopedics;  Laterality: Right;  3-C   TRANSTHORACIC ECHOCARDIOGRAM  02/04/2012   grade I diastolic dysfunction/  ef 55-60%/  trivial MR and TR/  mild LAE   VIDEO BRONCHOSCOPY WITH ENDOBRONCHIAL NAVIGATION Right 02/09/2024   Procedure: VIDEO BRONCHOSCOPY WITH ENDOBRONCHIAL NAVIGATION;  Surgeon: Shelah Lamar RAMAN, MD;  Location: [facility] ENDOSCOPY;  Service: Pulmonary;  Laterality: Right;  WITH FLOURO    REVIEW OF SYSTEMS:   Review of Systems  Constitutional: Stable fatigue. Negative for appetite change, chills, fever and unexpected weight change.  HENT: Negative for mouth sores, nosebleeds, sore throat and trouble swallowing.   Eyes: Negative for eye problems and icterus.  Respiratory: Stable dyspnea on exertion and intermittent cough. Negative for hemoptysis and wheezing.   Cardiovascular: Negative for chest pain and leg swelling.  Gastrointestinal: Negative for abdominal pain, constipation, diarrhea, nausea and vomiting.  Genitourinary: Negative for bladder incontinence, difficulty urinating, dysuria, frequency and hematuria.   Musculoskeletal: Negative for back pain, gait problem, neck pain and neck stiffness.  Skin: Negative for itching and rash.  Neurological: Negative for dizziness, extremity weakness, gait problem, headaches, light-headedness and seizures.  Hematological: Negative for adenopathy. Does not bruise/bleed easily.   Psychiatric/Behavioral: Negative for confusion, depression and sleep disturbance. The patient is not nervous/anxious.     PHYSICAL EXAMINATION:  Blood pressure (!) 149/80, pulse 72, temperature 98 F (36.7 C), temperature source Temporal, resp. rate 16, height 5' 8 (1.727 m), weight 213 lb (96.6 kg), SpO2 98%.  ECOG PERFORMANCE STATUS: 1  Physical Exam  Constitutional: Oriented to person, place, and time and well-developed, well-nourished, and in no distress.   HENT:  Head: Normocephalic and atraumatic.  Mouth/Throat: Oropharynx is clear and moist. No oropharyngeal exudate.  Eyes: Conjunctivae are normal. Right eye exhibits no discharge. Left eye exhibits no discharge. No scleral icterus.  Neck: Normal range of motion. Neck supple.  Cardiovascular: Normal rate, regular rhythm, normal heart sounds and intact distal pulses.   Pulmonary/Chest: Effort normal and breath sounds normal. On supplemental oxygen. No respiratory distress. No wheezes. No rales.  Abdominal: Soft. Bowel sounds are normal. Exhibits no distension and no mass. There is no tenderness.  Musculoskeletal: Normal range of motion. Exhibits no edema.  Lymphadenopathy:    No cervical adenopathy.  Neurological: Alert and oriented to person, place, and time. Exhibits normal muscle tone. Gait normal. Coordination normal.  Skin: Skin is warm and dry. No rash noted. Not diaphoretic. No erythema. No pallor.  Psychiatric: Mood, memory and judgment normal.  Vitals reviewed.  LABORATORY DATA: Lab Results  Component Value Date   WBC 8.1 11/30/2024   HGB 11.2 (L) 11/30/2024   HCT 33.6 (L) 11/30/2024   MCV 98.5 11/30/2024   PLT 139 (L) 11/30/2024      Chemistry      Component Value Date/Time   NA 140 11/30/2024 0959   K 4.2 11/30/2024 0959   CL 103 11/30/2024 0959   CO2 24 11/30/2024 0959   BUN 24 (H) 11/30/2024 0959   CREATININE 1.04 11/30/2024 0959   CREATININE 1.72 (H) 10/16/2016 1115      Component Value Date/Time    CALCIUM  9.4 11/30/2024 0959   ALKPHOS 104 11/30/2024 0959   AST 31 11/30/2024 0959   ALT 40 11/30/2024 0959   BILITOT 0.5 11/30/2024 0959       RADIOGRAPHIC STUDIES:  CT Chest W Contrast Result Date: 11/23/2024 CLINICAL DATA:  Non-small cell lung cancer (NSCLC), staging. * Tracking Code: BO * EXAM: CT CHEST  WITH CONTRAST TECHNIQUE: Multidetector CT imaging of the chest was performed during intravenous contrast administration. RADIATION DOSE REDUCTION: This exam was performed according to the departmental dose-optimization program which includes automated exposure control, adjustment of the mA and/or kV according to patient size and/or use of iterative reconstruction technique. CONTRAST:  75mL OMNIPAQUE  IOHEXOL  300 MG/ML  SOLN COMPARISON:  CT scan chest from 07/22/2024. FINDINGS: Cardiovascular: Normal cardiac size. No pericardial effusion. No aortic aneurysm. There are coronary artery calcifications, in keeping with coronary artery disease. There are also mild to moderate peripheral atherosclerotic vascular calcifications of thoracic aorta and its major branches. Mediastinum/Nodes: Visualized thyroid  gland appears grossly unremarkable. No solid / cystic mediastinal masses. The esophagus is nondistended precluding optimal assessment. There is mild circumferential thickening of the lower thoracic esophagus, which is most likely seen in the settings of chronic gastroesophageal reflux disease versus esophagitis. Redemonstration of multiple enlarged mediastinal and bilateral hilar lymph nodes, for example dominant lymph node in the right lower paratracheal region measuring 1.1 x 1.4 cm, which previously measured up to 1.0 x 1.5 cm. There is subcarinal lymph node with short axis of 11 mm, previously 11 mm as well. No axillary lymphadenopathy by size criteria. Lungs/Pleura: The central tracheo-bronchial tree is patent. Redemonstration of chronic peripheral/subpleural reticulations throughout bilateral lungs.  There are patchy areas of bronchiectasis. No honeycombing. Redemonstration of moderate right pleural effusion, grossly unchanged. However, there are new/increasing airspace opacities in the right upper lobe as well as superior segment of right lower lobe when compared to the prior exam. No new mass, consolidation or pleural effusion in the left lung. Evaluation before discrete lung nodule is limited due to background lung parenchymal changes; however, no suspicious lung nodule seen on either side. Upper Abdomen: There is a single 5 mm gallstone without imaging signs of acute cholecystitis. There is a 5 mm hypoattenuating focus in the right hepatic dome (series 2, image 84), which is too small to adequately characterize but appears unchanged since the prior study. There is small-to-moderate sized hiatal hernia. There is a partially exophytic hypoattenuating structure arising from the left kidney upper pole (series 2, image 141), which is incompletely characterized on the current exam but appears grossly unchanged. Remaining visualized upper abdominal viscera within normal limits. Musculoskeletal: The visualized soft tissues of the chest wall are grossly unremarkable. No suspicious osseous lesions. There are mild multilevel degenerative changes in the visualized spine. IMPRESSION: 1. There are new/increasing airspace opacities in the right upper lobe and superior segment of right lower lobe when compared to the prior exam. These are nonspecific and differential diagnosis includes infectious/inflammatory etiology versus lymphangitic spread of tumor. 2. Redemonstration of chronic peripheral/subpleural reticulations throughout bilateral lungs with patchy areas of bronchiectasis. No honeycombing. Findings favor interstitial lung disease. 3. Moderate right pleural effusion, grossly unchanged. 4. Redemonstration of multiple enlarged mediastinal and bilateral hilar lymph nodes, which are grossly unchanged since the prior  study. These are indeterminate in etiology. 5. Multiple other nonacute observations, as described above. Aortic Atherosclerosis (ICD10-I70.0). Electronically Signed   By: Ree Molt M.D.   On: 11/23/2024 13:48     ASSESSMENT/PLAN:  This is a very pleasant 79 year old Caucasian male with stage IIIa (T4, N1, M0) non-small cell lung cancer, adenocarcinoma.  The patient presented with a right lower lobe lung mass in addition to suspicious right upper lobe lung lesion and right hilar lymphadenopathy.  He was diagnosed in April 2025.  His molecular studies show that he has no actionable mutations.  His PD-L1 expression  is negative.  The patient completed neoadjuvant chemoimmunotherapy with carboplatin  for an AUC of 5, Alimta  500 mg/m, Keytruda  200 mg IV every 3 weeks.  He is status post 1 cycle but developed skin rash and fatigue.  Therefore this was discontinued due to intolerance.  He then underwent concurrent chemoradiation with carboplatin  for AUC of 2 and paclitaxel  45 mg/m.  He status post 7 cycles.  He is currently on consolidation immunotherapy with Imfinzi  1500 mg IV every 4 weeks.  He is status post 3 cycles.  The patient was seen with Dr. Sherrod today.  Dr. Sherrod personally and independently reviewed the scan and discussed results with the patient today.  The scan showed There are new/increasing airspace opacities in the right upper lobe and superior segment of right lower lobe when compared to the prior exam. These are nonspecific and differential diagnosis includes infectious/inflammatory etiology versus lymphangitic spread of tumor. He is just finishing up prednisone .  Dr. Sherrod recommends PET scan to further evaluate this.   Dr. Sherrod recommends holding his immunotherapy at this time.  We will see him back for follow-up visit in 4 weeks before undergoing cycle number 4. I will call him back to be seen sooner if concerns on his PET scan.  - Supported initiation of  pulmonary rehabilitation on February 6th. - Coordinated pulmonology follow-up post-pulmonary rehabilitation.  The patient was advised to call immediately if she has any concerning symptoms in the interval. The patient voices understanding of current disease status and treatment options and is in agreement with the current care plan. All questions were answered. The patient knows to call the clinic with any problems, questions or concerns. We can certainly see the patient much sooner if necessary   Orders Placed This Encounter  Procedures   NM PET Image Restage (PS) Skull Base to Thigh (F-18 FDG)    Standing Status:   Future    Expected Date:   12/07/2024    Expiration Date:   11/30/2025    If indicated for the ordered procedure, I authorize the administration of a radiopharmaceutical per Radiology protocol:   Yes    Preferred imaging location?:   [facility]   CBC with Differential (Cancer Center Only)    Standing Status:   Future    Expected Date:   12/28/2024    Expiration Date:   12/28/2025   CMP (Cancer Center only)    Standing Status:   Future    Expected Date:   12/28/2024    Expiration Date:   12/28/2025      Mamie Diiorio L Audrionna Lampton, PA-C 11/30/24  ADDENDUM: Hematology/Oncology Attending: I had a face-to-face encounter with the patient today.  I reviewed his record, lab, scan and recommended his care plan.  This is a very pleasant 79 years old white male with stage IIIa non-small cell lung cancer, adenocarcinoma diagnosed in April 2025 with no actionable mutation and negative PD-L1 expression.  He underwent a course of neoadjuvant systemic chemoimmunotherapy with carboplatin , Alimta  and Keytruda  for 1 cycle but this was discontinued secondary to intolerance.  The patient then underwent a course of concurrent chemoradiation with weekly carboplatin  and paclitaxel  and he has good response to the treatment.  He is then started consolidation treatment with immunotherapy with durvalumab   1500 mg IV every 4 weeks status post 3 cycles.  He has been tolerating the treatment well but recently he has more experience with fatigue in addition to shortness of breath with minimal exertion.  His oxygen saturation has  been dropping less than 88% and he is currently on home oxygen 2-2.5 L/min.  The patient had repeat CT scan of the chest performed recently.  I personally independently reviewed the scan images and discussed the result with the patient today.  His scan showed new and increasing airspace opacity in the right upper lobe and superior segment of the right lower lobe when compared to the previous exam and this is our nonspecific but differential diagnosis include infectious/inflammatory etiology versus lymphangitic spread of tumor.  He also has moderate right pleural effusions that are unchanged.  The patient also has finding of interstitial lung disease on the scan.  The multiple enlarged mediastinal and bilateral hilar lymph nodes were unchanged compared to the prior exam.  I recommended for the patient to hold his treatment with immunotherapy for now. I will arrange for him to have a PET scan for further evaluation of his disease and to rule out any disease progression.  If no concerning finding for disease progression, we can resume his treatment after a course of antibiotics and short course of steroid. The patient will come back for follow-up visit in 2 weeks for evaluation and discussion of his treatment options. He was advised to call immediately if he has any other concerning symptoms in the interval. Disclaimer: This note was dictated with voice recognition software. Similar sounding words can inadvertently be transcribed and may be missed upon review. Sherrod MARLA Sherrod, MD

## 2024-11-30 ENCOUNTER — Inpatient Hospital Stay: Admitting: Physician Assistant

## 2024-11-30 ENCOUNTER — Other Ambulatory Visit: Payer: Self-pay

## 2024-11-30 ENCOUNTER — Other Ambulatory Visit: Payer: Self-pay | Admitting: Physician Assistant

## 2024-11-30 ENCOUNTER — Inpatient Hospital Stay

## 2024-11-30 ENCOUNTER — Inpatient Hospital Stay: Attending: Internal Medicine

## 2024-11-30 VITALS — BP 149/80 | HR 72 | Temp 98.0°F | Resp 16 | Ht 68.0 in | Wt 213.0 lb

## 2024-11-30 DIAGNOSIS — C3490 Malignant neoplasm of unspecified part of unspecified bronchus or lung: Secondary | ICD-10-CM

## 2024-11-30 DIAGNOSIS — C3431 Malignant neoplasm of lower lobe, right bronchus or lung: Secondary | ICD-10-CM

## 2024-11-30 LAB — CBC WITH DIFFERENTIAL (CANCER CENTER ONLY)
Abs Immature Granulocytes: 0.06 K/uL (ref 0.00–0.07)
Basophils Absolute: 0 K/uL (ref 0.0–0.1)
Basophils Relative: 0 %
Eosinophils Absolute: 0 K/uL (ref 0.0–0.5)
Eosinophils Relative: 0 %
HCT: 33.6 % — ABNORMAL LOW (ref 39.0–52.0)
Hemoglobin: 11.2 g/dL — ABNORMAL LOW (ref 13.0–17.0)
Immature Granulocytes: 1 %
Lymphocytes Relative: 5 %
Lymphs Abs: 0.4 K/uL — ABNORMAL LOW (ref 0.7–4.0)
MCH: 32.8 pg (ref 26.0–34.0)
MCHC: 33.3 g/dL (ref 30.0–36.0)
MCV: 98.5 fL (ref 80.0–100.0)
Monocytes Absolute: 0.5 K/uL (ref 0.1–1.0)
Monocytes Relative: 6 %
Neutro Abs: 7.1 K/uL (ref 1.7–7.7)
Neutrophils Relative %: 88 %
Platelet Count: 139 K/uL — ABNORMAL LOW (ref 150–400)
RBC: 3.41 MIL/uL — ABNORMAL LOW (ref 4.22–5.81)
RDW: 14.6 % (ref 11.5–15.5)
WBC Count: 8.1 K/uL (ref 4.0–10.5)
nRBC: 0 % (ref 0.0–0.2)

## 2024-11-30 LAB — CMP (CANCER CENTER ONLY)
ALT: 40 U/L (ref 0–44)
AST: 31 U/L (ref 15–41)
Albumin: 4.2 g/dL (ref 3.5–5.0)
Alkaline Phosphatase: 104 U/L (ref 38–126)
Anion gap: 13 (ref 5–15)
BUN: 24 mg/dL — ABNORMAL HIGH (ref 8–23)
CO2: 24 mmol/L (ref 22–32)
Calcium: 9.4 mg/dL (ref 8.9–10.3)
Chloride: 103 mmol/L (ref 98–111)
Creatinine: 1.04 mg/dL (ref 0.61–1.24)
GFR, Estimated: >60 mL/min
Glucose, Bld: 152 mg/dL — ABNORMAL HIGH (ref 70–99)
Potassium: 4.2 mmol/L (ref 3.5–5.1)
Sodium: 140 mmol/L (ref 135–145)
Total Bilirubin: 0.5 mg/dL (ref 0.0–1.2)
Total Protein: 6.8 g/dL (ref 6.5–8.1)

## 2024-11-30 LAB — SAMPLE TO BLOOD BANK

## 2024-11-30 LAB — TSH: TSH: 2.88 u[IU]/mL (ref 0.350–4.500)

## 2024-11-30 LAB — T4, FREE: Free T4: 1.17 ng/dL (ref 0.80–2.00)

## 2024-12-03 ENCOUNTER — Other Ambulatory Visit: Payer: Self-pay

## 2024-12-07 ENCOUNTER — Telehealth: Payer: Self-pay | Admitting: Medical Oncology

## 2024-12-07 NOTE — Telephone Encounter (Signed)
 I received a form from [facility] asking if pts immunotherapy can be administered at home.   Form sent to [facility].

## 2024-12-08 ENCOUNTER — Telehealth: Payer: Self-pay | Admitting: Medical Oncology

## 2024-12-08 NOTE — Telephone Encounter (Signed)
 Faxed Home Infusion Request form and received receipt confirmation . Pt may not get home imfinzi  due to it not being safe.

## 2024-12-09 ENCOUNTER — Telehealth: Payer: Self-pay

## 2024-12-09 NOTE — Telephone Encounter (Signed)
 Pt called and stated he hasn't received appt for his PET scan.  Gave pt number to central scheduling.  Pt verbalized understanding.

## 2024-12-13 ENCOUNTER — Telehealth (INDEPENDENT_AMBULATORY_CARE_PROVIDER_SITE_OTHER): Admitting: Pulmonary Disease

## 2024-12-13 ENCOUNTER — Encounter (HOSPITAL_BASED_OUTPATIENT_CLINIC_OR_DEPARTMENT_OTHER): Payer: Self-pay | Admitting: Pulmonary Disease

## 2024-12-13 ENCOUNTER — Ambulatory Visit (HOSPITAL_BASED_OUTPATIENT_CLINIC_OR_DEPARTMENT_OTHER): Admitting: Pulmonary Disease

## 2024-12-13 VITALS — BP 135/84 | HR 78 | Ht 68.0 in | Wt 211.0 lb

## 2024-12-13 DIAGNOSIS — J701 Chronic and other pulmonary manifestations due to radiation: Secondary | ICD-10-CM | POA: Insufficient documentation

## 2024-12-13 DIAGNOSIS — J9611 Chronic respiratory failure with hypoxia: Secondary | ICD-10-CM | POA: Insufficient documentation

## 2024-12-13 DIAGNOSIS — C3431 Malignant neoplasm of lower lobe, right bronchus or lung: Secondary | ICD-10-CM

## 2024-12-13 DIAGNOSIS — W888XXS Exposure to other ionizing radiation, sequela: Secondary | ICD-10-CM

## 2024-12-13 MED ORDER — AMOXICILLIN-POT CLAVULANATE 875-125 MG PO TABS: 1.0000 | ORAL_TABLET | Freq: Two times a day (BID) | ORAL | 0 refills | Status: AC

## 2024-12-13 NOTE — Assessment & Plan Note (Addendum)
-   Ambulatory O2 on 11/15/24 with stable O2 on 2L pulsed - Discussed red flags including respiratory distress, persistent hypoxemia >90% above prescribed level of oxygen.

## 2024-12-13 NOTE — Assessment & Plan Note (Addendum)
 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 Can use up to 3 times a day when symptoms worse - CONTINUE budesonide  nebulizer daily - [facility] physical therapy evaluation scheduled 12/17/24

## 2024-12-14 ENCOUNTER — Other Ambulatory Visit: Payer: Self-pay

## 2024-12-15 ENCOUNTER — Telehealth: Payer: Self-pay | Admitting: Cardiovascular Disease

## 2024-12-15 DIAGNOSIS — I480 Paroxysmal atrial fibrillation: Secondary | ICD-10-CM

## 2024-12-15 MED ORDER — RIVAROXABAN 20 MG PO TABS: 20.0000 mg | ORAL_TABLET | Freq: Every day | ORAL | 1 refills | Status: AC

## 2024-12-15 MED ORDER — NITROGLYCERIN 0.4 MG SL SUBL: 0.4000 mg | SUBLINGUAL_TABLET | SUBLINGUAL | 5 refills | Status: AC | PRN

## 2024-12-15 NOTE — Telephone Encounter (Signed)
 Refill sent.  Prescription refill request for Xarelto  received.  Indication: AFIB Last office visit: 09/06/24 Weight: 95.7KG Age: 79 Scr: 1.04 (11/30/24) CrCl: 78

## 2024-12-15 NOTE — Telephone Encounter (Signed)
" °*  STAT* If patient is at the pharmacy, call can be transferred to refill team.   1. Which medications need to be refilled? (please list name of each medication and dose if known) nitroGLYCERIN  (NITROSTAT ) 0.4 MG SL tablet  rivaroxaban  (XARELTO ) 20 MG TABS tablet   2. Would you like to learn more about the convenience, safety, & potential cost savings by using the [facility]? No   3. Are you open to using the [facility] (Type [facility].) No   4. Which pharmacy/location (including street and city if local pharmacy) is medication to be sent to?  [facility] - [place], [place] - [address]   5. Do they need a 30 day or 90 day supply? 90 day  Pt has scheduled appts on 3/12 (Gen Card) and 4/27 (EP)  "

## 2024-12-17 ENCOUNTER — Encounter (HOSPITAL_BASED_OUTPATIENT_CLINIC_OR_DEPARTMENT_OTHER): Payer: Self-pay

## 2024-12-17 ENCOUNTER — Ambulatory Visit (HOSPITAL_BASED_OUTPATIENT_CLINIC_OR_DEPARTMENT_OTHER): Admitting: Physical Therapy

## 2024-12-24 ENCOUNTER — Other Ambulatory Visit (HOSPITAL_COMMUNITY)

## 2024-12-28 ENCOUNTER — Inpatient Hospital Stay: Admitting: Internal Medicine

## 2024-12-28 ENCOUNTER — Inpatient Hospital Stay

## 2025-01-20 ENCOUNTER — Ambulatory Visit: Admitting: Emergency Medicine

## 2025-01-25 ENCOUNTER — Inpatient Hospital Stay

## 2025-01-25 ENCOUNTER — Inpatient Hospital Stay: Admitting: Internal Medicine

## 2025-01-25 ENCOUNTER — Inpatient Hospital Stay: Attending: Internal Medicine

## 2025-03-07 ENCOUNTER — Ambulatory Visit: Admitting: Cardiology

## 2025-03-14 ENCOUNTER — Ambulatory Visit (HOSPITAL_BASED_OUTPATIENT_CLINIC_OR_DEPARTMENT_OTHER): Admitting: Pulmonary Disease
